# Patient Record
Sex: Male | Born: 1960 | Race: Black or African American | Hispanic: No | Smoking: Current every day smoker
Health system: Southern US, Community
[De-identification: ages and names within clinical notes are randomized; demographics above are authoritative.]

## PROBLEM LIST (undated history)

## (undated) DIAGNOSIS — K469 Unspecified abdominal hernia without obstruction or gangrene: Secondary | ICD-10-CM

## (undated) DIAGNOSIS — I10 Essential (primary) hypertension: Secondary | ICD-10-CM

## (undated) DIAGNOSIS — I2699 Other pulmonary embolism without acute cor pulmonale: Secondary | ICD-10-CM

## (undated) DIAGNOSIS — M1990 Unspecified osteoarthritis, unspecified site: Secondary | ICD-10-CM

## (undated) DIAGNOSIS — A159 Respiratory tuberculosis unspecified: Secondary | ICD-10-CM

## (undated) DIAGNOSIS — R569 Unspecified convulsions: Secondary | ICD-10-CM

## (undated) DIAGNOSIS — F1020 Alcohol dependence, uncomplicated: Secondary | ICD-10-CM

## (undated) DIAGNOSIS — S82409A Unspecified fracture of shaft of unspecified fibula, initial encounter for closed fracture: Secondary | ICD-10-CM

## (undated) HISTORY — PX: ANKLE SURGERY: SHX546

---

## 1998-04-24 ENCOUNTER — Emergency Department (HOSPITAL_COMMUNITY): Admission: EM | Admit: 1998-04-24 | Discharge: 1998-04-24 | Payer: Self-pay | Admitting: Emergency Medicine

## 1999-04-07 ENCOUNTER — Encounter: Admission: RE | Admit: 1999-04-07 | Discharge: 1999-04-07 | Payer: Self-pay | Admitting: Internal Medicine

## 1999-06-05 ENCOUNTER — Encounter: Payer: Self-pay | Admitting: Emergency Medicine

## 1999-06-05 ENCOUNTER — Emergency Department (HOSPITAL_COMMUNITY): Admission: EM | Admit: 1999-06-05 | Discharge: 1999-06-05 | Payer: Self-pay | Admitting: Emergency Medicine

## 1999-06-16 ENCOUNTER — Emergency Department (HOSPITAL_COMMUNITY): Admission: EM | Admit: 1999-06-16 | Discharge: 1999-06-16 | Payer: Self-pay | Admitting: *Deleted

## 1999-06-27 ENCOUNTER — Emergency Department (HOSPITAL_COMMUNITY): Admission: EM | Admit: 1999-06-27 | Discharge: 1999-06-27 | Payer: Self-pay | Admitting: Emergency Medicine

## 1999-06-27 ENCOUNTER — Encounter: Payer: Self-pay | Admitting: Emergency Medicine

## 1999-07-07 ENCOUNTER — Emergency Department (HOSPITAL_COMMUNITY): Admission: EM | Admit: 1999-07-07 | Discharge: 1999-07-07 | Payer: Self-pay | Admitting: Emergency Medicine

## 2001-05-14 ENCOUNTER — Emergency Department (HOSPITAL_COMMUNITY): Admission: EM | Admit: 2001-05-14 | Discharge: 2001-05-14 | Payer: Self-pay | Admitting: Emergency Medicine

## 2001-05-14 ENCOUNTER — Encounter: Payer: Self-pay | Admitting: Emergency Medicine

## 2001-07-03 ENCOUNTER — Encounter: Admission: RE | Admit: 2001-07-03 | Discharge: 2001-07-03 | Payer: Self-pay | Admitting: Internal Medicine

## 2002-09-21 ENCOUNTER — Encounter: Payer: Self-pay | Admitting: Emergency Medicine

## 2002-09-21 ENCOUNTER — Emergency Department (HOSPITAL_COMMUNITY): Admission: EM | Admit: 2002-09-21 | Discharge: 2002-09-21 | Payer: Self-pay | Admitting: Emergency Medicine

## 2003-11-05 ENCOUNTER — Emergency Department (HOSPITAL_COMMUNITY): Admission: AD | Admit: 2003-11-05 | Discharge: 2003-11-05 | Payer: Self-pay | Admitting: Family Medicine

## 2004-10-02 ENCOUNTER — Emergency Department (HOSPITAL_COMMUNITY): Admission: EM | Admit: 2004-10-02 | Discharge: 2004-10-02 | Payer: Self-pay | Admitting: Family Medicine

## 2005-02-06 ENCOUNTER — Emergency Department (HOSPITAL_COMMUNITY): Admission: EM | Admit: 2005-02-06 | Discharge: 2005-02-06 | Payer: Self-pay | Admitting: Emergency Medicine

## 2006-12-29 ENCOUNTER — Emergency Department (HOSPITAL_COMMUNITY): Admission: EM | Admit: 2006-12-29 | Discharge: 2006-12-29 | Payer: Self-pay | Admitting: Emergency Medicine

## 2008-01-21 ENCOUNTER — Emergency Department (HOSPITAL_COMMUNITY): Admission: EM | Admit: 2008-01-21 | Discharge: 2008-01-21 | Payer: Self-pay | Admitting: Emergency Medicine

## 2008-06-14 ENCOUNTER — Ambulatory Visit: Payer: Self-pay | Admitting: Internal Medicine

## 2008-06-14 ENCOUNTER — Encounter: Payer: Self-pay | Admitting: Family Medicine

## 2008-06-14 LAB — CONVERTED CEMR LAB
Albumin: 4.2 g/dL (ref 3.5–5.2)
BUN: 8 mg/dL (ref 6–23)
CO2: 25 meq/L (ref 19–32)
Calcium: 9.6 mg/dL (ref 8.4–10.5)
Chloride: 104 meq/L (ref 96–112)
Cholesterol: 177 mg/dL (ref 0–200)
Creatinine, Ser: 0.86 mg/dL (ref 0.40–1.50)
Eosinophils Absolute: 0.1 10*3/uL (ref 0.0–0.7)
Eosinophils Relative: 2 % (ref 0–5)
Glucose, Bld: 69 mg/dL — ABNORMAL LOW (ref 70–99)
HCT: 41.3 % (ref 39.0–52.0)
HDL: 82 mg/dL (ref >39)
Hemoglobin: 14.1 g/dL (ref 13.0–17.0)
Lymphocytes Relative: 19 % (ref 12–46)
Lymphs Abs: 1.4 10*3/uL (ref 0.7–4.0)
MCV: 92.6 fL (ref 78.0–100.0)
Monocytes Absolute: 0.7 10*3/uL (ref 0.1–1.0)
Potassium: 4.3 meq/L (ref 3.5–5.3)
TSH: 1.142 microintl units/mL (ref 0.350–4.50)
Triglycerides: 42 mg/dL (ref <150)
WBC: 7.6 10*3/uL (ref 4.0–10.5)

## 2009-06-17 ENCOUNTER — Emergency Department (HOSPITAL_COMMUNITY): Admission: EM | Admit: 2009-06-17 | Discharge: 2009-06-17 | Payer: Self-pay | Admitting: Emergency Medicine

## 2009-06-18 ENCOUNTER — Emergency Department (HOSPITAL_COMMUNITY): Admission: EM | Admit: 2009-06-18 | Discharge: 2009-06-18 | Payer: Self-pay | Admitting: Emergency Medicine

## 2009-06-25 ENCOUNTER — Emergency Department (HOSPITAL_COMMUNITY): Admission: EM | Admit: 2009-06-25 | Discharge: 2009-06-25 | Payer: Self-pay | Admitting: Emergency Medicine

## 2009-07-04 ENCOUNTER — Emergency Department (HOSPITAL_COMMUNITY): Admission: EM | Admit: 2009-07-04 | Discharge: 2009-07-04 | Payer: Self-pay | Admitting: Emergency Medicine

## 2009-08-07 ENCOUNTER — Emergency Department (HOSPITAL_COMMUNITY): Admission: EM | Admit: 2009-08-07 | Discharge: 2009-08-07 | Payer: Self-pay | Admitting: Emergency Medicine

## 2011-02-16 ENCOUNTER — Emergency Department (HOSPITAL_COMMUNITY)
Admission: EM | Admit: 2011-02-16 | Discharge: 2011-02-16 | Disposition: A | Discharge status: Home / Self Care | Payer: Self-pay | Attending: Emergency Medicine | Admitting: Emergency Medicine

## 2011-02-16 ENCOUNTER — Emergency Department (HOSPITAL_COMMUNITY): Payer: Self-pay

## 2011-02-16 DIAGNOSIS — M25579 Pain in unspecified ankle and joints of unspecified foot: Secondary | ICD-10-CM | POA: Insufficient documentation

## 2011-02-16 DIAGNOSIS — Y9302 Activity, running: Secondary | ICD-10-CM | POA: Insufficient documentation

## 2011-02-16 DIAGNOSIS — X500XXA Overexertion from strenuous movement or load, initial encounter: Secondary | ICD-10-CM | POA: Insufficient documentation

## 2011-02-16 DIAGNOSIS — S82409A Unspecified fracture of shaft of unspecified fibula, initial encounter for closed fracture: Secondary | ICD-10-CM | POA: Insufficient documentation

## 2011-02-26 ENCOUNTER — Ambulatory Visit (HOSPITAL_BASED_OUTPATIENT_CLINIC_OR_DEPARTMENT_OTHER)
Admission: RE | Admit: 2011-02-26 | Discharge: 2011-02-26 | Disposition: A | Discharge status: Home / Self Care | Payer: Self-pay | Source: Ambulatory Visit | Attending: Orthopedic Surgery | Admitting: Orthopedic Surgery

## 2011-02-26 DIAGNOSIS — F17200 Nicotine dependence, unspecified, uncomplicated: Secondary | ICD-10-CM | POA: Insufficient documentation

## 2011-02-26 DIAGNOSIS — S82899A Other fracture of unspecified lower leg, initial encounter for closed fracture: Secondary | ICD-10-CM | POA: Insufficient documentation

## 2011-02-26 DIAGNOSIS — Y929 Unspecified place or not applicable: Secondary | ICD-10-CM | POA: Insufficient documentation

## 2011-02-26 DIAGNOSIS — X58XXXA Exposure to other specified factors, initial encounter: Secondary | ICD-10-CM | POA: Insufficient documentation

## 2011-02-26 LAB — POCT HEMOGLOBIN-HEMACUE: Hemoglobin: 15.2 g/dL (ref 13.0–17.0)

## 2011-03-01 NOTE — Op Note (Signed)
[name], [name] NO.:  [id number]  MEDICAL RECORD NO.:  [id number]           PATIENT TYPE:  LOCATION:                                 FACILITY:  PHYSICIAN:  [name], MD    DATE OF BIRTH:  1960/12/25  DATE OF PROCEDURE:  02/26/2011 DATE OF DISCHARGE:                              OPERATIVE REPORT   PREOPERATIVE DIAGNOSIS:  Left distal fibula fracture.  POSTOPERATIVE DIAGNOSIS:  Left distal fibula fracture.  OPERATIVE PROCEDURE:  Open reduction and internal fixation, left distal fibula fracture.  ANESTHESIA:  General.  ESTIMATED BLOOD LOSS:  Minimal.  TOURNIQUET TIME:  Approximately 40 minutes.  ATTENDING SURGEON:  [name], MD  ASSISTANT:  Janace Litten, orthopedic PA-C  OPERATIVE IMPLANT:  Synthes one-third tubular plate 5-hole with an interfragmentary lag screw.  PREOPERATIVE INDICATIONS:  [name] is a 50 year old gentleman who broke his left ankle.  He presented to the office and had primarily lateral ankle pain.  He has a history of noncompliance, and had a similar type injury on his right side done in the past, and was placed in a long-leg cast, and he adamantly refused to be placed in another of the long-leg cast on his left lower extremity.  Nonetheless, it was apparent that he was going to be weightbearing on it, given his noncompliance.  The risks, benefits, and alternatives were discussed with him before the procedure including but not limited to the risks of infection, bleeding, nerve injury, malunion, nonunion, hardware prominence, hardware failure, loss of reduction with weightbearing, cardiopulmonary complications, among others and he is willing to proceed.  OPERATIVE PROCEDURE:  The patient was brought to the operating room and placed in supine position.  IV antibiotics were given.  General anesthesia was administered.  The left leg was stressed and the medial clear space unstable.  The left lower  extremity was then prepped and draped in the usual sterile fashion.  Lateral incision of the fibula was performed.  Dissection was carried down.  The fracture was identified and cleaned and reduced anatomically and held with a clamp.  I placed a front to back lag screw and this secured anatomic fixation.  I then applied a 5-hole plate.  I used 2 screws distally and 2 screws proximally.  One of the screws distally had mediocre bite, and the most distal screw had excellent bite.  Both proximal screws had excellent bite.  I then irrigated the wounds copiously and then repaired the tissue with Vicryl followed by Steri-Strips and sterile gauze.  The patient was awakened and extubated, returned to PACU in stable and satisfactory condition.  There were no complications and he tolerated the procedure well.  I did stress the syndesmosis after fixation and this was stable.  Janace Litten, orthopedic PA-C was present and scrubbed and critical for exposure, reduction, as well as closure.  He was placed in a short-leg fiberglass cast and bivalved, inorder to provide some added protection given his risk for noncompliance.     [name], MD     JPL/MEDQ  D:  02/26/2011  T:  02/27/2011  Job:  [id number]  Electronically Signed by Teryl Lucy MD on 03/01/2011 04:54:15 PM

## 2011-03-12 LAB — DIFFERENTIAL
Basophils Absolute: 0 10*3/uL (ref 0.0–0.1)
Lymphocytes Relative: 19 % (ref 12–46)
Lymphs Abs: 0.9 10*3/uL (ref 0.7–4.0)
Monocytes Absolute: 0.6 10*3/uL (ref 0.1–1.0)
Neutro Abs: 3 10*3/uL (ref 1.7–7.7)

## 2011-03-12 LAB — POCT I-STAT, CHEM 8
Creatinine, Ser: 0.9 mg/dL (ref 0.4–1.5)
Glucose, Bld: 95 mg/dL (ref 70–99)
Hemoglobin: 13.9 g/dL (ref 13.0–17.0)
Sodium: 140 mEq/L (ref 135–145)
TCO2: 28 mmol/L (ref 0–100)

## 2011-03-12 LAB — RAPID URINE DRUG SCREEN, HOSP PERFORMED
Barbiturates: NOT DETECTED
Benzodiazepines: NOT DETECTED
Cocaine: NOT DETECTED

## 2011-03-12 LAB — CBC
Hemoglobin: 12.9 g/dL — ABNORMAL LOW (ref 13.0–17.0)
RDW: 14.5 % (ref 11.5–15.5)
WBC: 4.5 10*3/uL (ref 4.0–10.5)

## 2011-03-12 LAB — ETHANOL: Alcohol, Ethyl (B): 176 mg/dL — ABNORMAL HIGH (ref 0–10)

## 2011-08-21 ENCOUNTER — Emergency Department (HOSPITAL_COMMUNITY): Payer: Self-pay

## 2011-08-21 ENCOUNTER — Emergency Department (HOSPITAL_COMMUNITY)
Admission: EM | Admit: 2011-08-21 | Discharge: 2011-08-21 | Disposition: A | Discharge status: Home / Self Care | Payer: Self-pay | Attending: Emergency Medicine | Admitting: Emergency Medicine

## 2011-08-21 DIAGNOSIS — R040 Epistaxis: Secondary | ICD-10-CM | POA: Insufficient documentation

## 2011-08-21 DIAGNOSIS — R221 Localized swelling, mass and lump, neck: Secondary | ICD-10-CM | POA: Insufficient documentation

## 2011-08-21 DIAGNOSIS — J322 Chronic ethmoidal sinusitis: Secondary | ICD-10-CM | POA: Insufficient documentation

## 2011-08-21 DIAGNOSIS — S022XXA Fracture of nasal bones, initial encounter for closed fracture: Secondary | ICD-10-CM | POA: Insufficient documentation

## 2011-08-21 DIAGNOSIS — R220 Localized swelling, mass and lump, head: Secondary | ICD-10-CM | POA: Insufficient documentation

## 2012-02-09 ENCOUNTER — Encounter (HOSPITAL_COMMUNITY): Payer: Self-pay | Admitting: *Deleted

## 2012-02-09 ENCOUNTER — Emergency Department (HOSPITAL_COMMUNITY)
Admission: EM | Admit: 2012-02-09 | Discharge: 2012-02-09 | Disposition: A | Discharge status: Home / Self Care | Payer: Self-pay | Attending: Emergency Medicine | Admitting: Emergency Medicine

## 2012-02-09 DIAGNOSIS — M1710 Unilateral primary osteoarthritis, unspecified knee: Secondary | ICD-10-CM

## 2012-02-09 DIAGNOSIS — M25569 Pain in unspecified knee: Secondary | ICD-10-CM | POA: Insufficient documentation

## 2012-02-09 DIAGNOSIS — F17200 Nicotine dependence, unspecified, uncomplicated: Secondary | ICD-10-CM | POA: Insufficient documentation

## 2012-02-09 MED ORDER — HYDROCODONE-ACETAMINOPHEN 5-325 MG PO TABS: 1.0000 | ORAL_TABLET | ORAL | Status: AC | PRN

## 2012-02-09 MED ORDER — NAPROXEN 500 MG PO TABS
500.0000 mg | ORAL_TABLET | Freq: Once | ORAL | Status: AC
  Administered 2012-02-09: 500 mg via ORAL
  Filled 2012-02-09: qty 1

## 2012-02-09 MED ORDER — NAPROXEN 500 MG PO TABS: 500.0000 mg | ORAL_TABLET | Freq: Two times a day (BID) | ORAL | Status: DC

## 2012-02-09 NOTE — ED Notes (Signed)
Pt states he is riding the bus home.  

## 2012-02-09 NOTE — Discharge Instructions (Signed)
FOLLOW UP WITH YOUR DOCTOR FOR FURTHER MANAGEMENT OF CHRONIC KNEE PAIN. TAKE NAPROSYN AS DIRECTED, AND NORCO AT NIGHT IF NEEDED FOR ADDITIONAL PAIN.  RETURN HERE AS NEEDED.  Arthritis, Nonspecific Arthritis is pain, redness, warmth, or puffiness (swelling) of a joint. The joint may be stiff or hurt when you move it. One or more joints may be affected. There are many types of arthritis. Your doctor may not know what type you have right away. The most common cause of arthritis is wear and tear on the joint (osteoarthritis). HOME CARE   Only take medicine as told by your doctor.   Rest the joint as much as possible.   Raise (elevate) your joint if it is puffy.   Use crutches if the painful joint is in your leg.   Drink enough water and fluids to keep your pee (urine) clear or pale yellow.   Follow your doctor's instructions for diet.   Use cold packs for very bad joint pain for 10 to 15 minutes every hour. Ask your doctor if it is okay for you to use hot packs.   Exercise as told by your doctor.   Take a warm shower if you have stiffness in the morning.   Move your sore joints throughout the day.  GET HELP RIGHT AWAY IF:   You do not feel better in 24 hours or are getting worse.   You are having side effects from your medicine.   You are not getting better with treatment.   You have a fever.   You have very bad joint pain, puffiness, or redness.   Many joints become painful and puffy.   You have very bad back pain or leg weakness.   You cannot control when you poop (bowel movement) or pee (urinate).  MAKE SURE YOU:   Understand these instructions.   Will watch your condition.   Will get help right away if you are not doing well or get worse.  Document Released: 02/16/2010 Document Revised: 11/11/2011 Document Reviewed: 02/16/2010 [facility] Patient Information 2012 [place], [place].

## 2012-02-09 NOTE — ED Provider Notes (Signed)
History     CSN: [id number]  Arrival date & time 02/09/12  1043   First MD Initiated Contact with Patient 02/09/12 1047      Chief Complaint  Patient presents with  . Knee Pain    (Consider location/radiation/quality/duration/timing/severity/associated sxs/prior treatment) Patient is a 51 y.o. male presenting with knee pain. The history is provided by the patient.  Knee Pain This is a chronic problem. The current episode started more than 1 month ago. The problem occurs daily. The problem has been unchanged. Pertinent negatives include no chills or fever. Associated symptoms comments: Bilateral knee pain for "months". He reports painful knots on medial knees chronically. He denies swelling, redness, calf pain. Worse with movement, initial ambulation that is better over time. 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 . The symptoms are aggravated by bending, standing and walking. He has tried acetaminophen for the symptoms. The treatment provided no relief.    History reviewed. No pertinent past medical history.  Past Surgical History  Procedure Date  . Ankle surgery     No family history on file.  History  Substance Use Topics  . Smoking status: Current Everyday Smoker  . Smokeless tobacco: Not on file  . Alcohol Use: 1.2 oz/week    2 Cans of beer per week      Review of Systems  Constitutional: Negative for fever and chills.  HENT: Negative.   Respiratory: Negative.   Cardiovascular: Negative.   Gastrointestinal: Negative.   Musculoskeletal: Negative.        See HPI  Skin: Negative.   Neurological: Negative.     Allergies  Review of patient's allergies indicates no known allergies.  Home Medications   Current Outpatient Rx  Name Route Sig Dispense Refill  . IBUPROFEN 200 MG PO TABS Oral Take 400 mg by mouth every 8 (eight) hours as needed. For pain.      BP 127/82  Pulse 94  Temp(Src) 98.7 F (37.1 C) (Oral)  Resp 22  Ht 6\' 1"   (1.854 m)  Wt 185 lb (83.915 kg)  BMI 24.41 kg/m2  SpO2 98%  Physical Exam  Constitutional: He is oriented to person, place, and time. He appears well-developed and well-nourished.  Neck: Normal range of motion.  Pulmonary/Chest: Effort normal.  Musculoskeletal: Normal range of motion.       Knees bilaterally unremarkable in appearance without redness, swelling. Joints stable without laxity. Nontender joints and nontender calves. Distal pulses 2+.  Neurological: He is alert and oriented to person, place, and time.  Skin: Skin is warm and dry.  Psychiatric: He has a normal mood and affect.    ED Course  Procedures (including critical care time)  Labs Reviewed - No data to display No results found.   No diagnosis found.    MDM          Rodena Medin, PA-C 02/09/12 1141

## 2012-02-09 NOTE — Progress Notes (Signed)
Pt listed as self pay with no insurance coverage Pt confirms he is self pay [place] resident.  CM and [facility] coordinator spoke with him Pt offered [facility] services to assist with finding a [place] self pay provider  

## 2012-02-09 NOTE — ED Notes (Signed)
Pt states he has been having bilateral knee pain since he was younger. Pt states pt would come and go. Pt states as he has gotten older pain has became constant. Pt states he has applied for disability. Pt states he wants information on his knee so disability can see that he is getting check out

## 2012-02-10 NOTE — ED Provider Notes (Signed)
Medical screening examination/treatment/procedure(s) were performed by non-physician practitioner and as supervising physician I was immediately available for consultation/collaboration.  Ivonna Kinnick, MD 02/10/12 0726 

## 2012-03-22 ENCOUNTER — Emergency Department (HOSPITAL_COMMUNITY)
Admission: EM | Admit: 2012-03-22 | Discharge: 2012-03-23 | Disposition: A | Discharge status: Home / Self Care | Payer: Self-pay | Attending: Emergency Medicine | Admitting: Emergency Medicine

## 2012-03-22 ENCOUNTER — Emergency Department (HOSPITAL_COMMUNITY): Payer: Self-pay

## 2012-03-22 ENCOUNTER — Encounter (HOSPITAL_COMMUNITY): Payer: Self-pay | Admitting: Emergency Medicine

## 2012-03-22 DIAGNOSIS — R109 Unspecified abdominal pain: Secondary | ICD-10-CM | POA: Insufficient documentation

## 2012-03-22 DIAGNOSIS — M25519 Pain in unspecified shoulder: Secondary | ICD-10-CM | POA: Insufficient documentation

## 2012-03-22 DIAGNOSIS — R079 Chest pain, unspecified: Secondary | ICD-10-CM | POA: Insufficient documentation

## 2012-03-22 DIAGNOSIS — S0180XA Unspecified open wound of other part of head, initial encounter: Secondary | ICD-10-CM | POA: Insufficient documentation

## 2012-03-22 DIAGNOSIS — S2231XA Fracture of one rib, right side, initial encounter for closed fracture: Secondary | ICD-10-CM

## 2012-03-22 DIAGNOSIS — R221 Localized swelling, mass and lump, neck: Secondary | ICD-10-CM | POA: Insufficient documentation

## 2012-03-22 DIAGNOSIS — S0990XA Unspecified injury of head, initial encounter: Secondary | ICD-10-CM | POA: Insufficient documentation

## 2012-03-22 DIAGNOSIS — S2239XA Fracture of one rib, unspecified side, initial encounter for closed fracture: Secondary | ICD-10-CM | POA: Insufficient documentation

## 2012-03-22 DIAGNOSIS — S0181XA Laceration without foreign body of other part of head, initial encounter: Secondary | ICD-10-CM

## 2012-03-22 DIAGNOSIS — R220 Localized swelling, mass and lump, head: Secondary | ICD-10-CM | POA: Insufficient documentation

## 2012-03-22 DIAGNOSIS — S42009A Fracture of unspecified part of unspecified clavicle, initial encounter for closed fracture: Secondary | ICD-10-CM | POA: Insufficient documentation

## 2012-03-22 DIAGNOSIS — S42001A Fracture of unspecified part of right clavicle, initial encounter for closed fracture: Secondary | ICD-10-CM

## 2012-03-22 DIAGNOSIS — R51 Headache: Secondary | ICD-10-CM | POA: Insufficient documentation

## 2012-03-22 LAB — URINALYSIS, MICROSCOPIC ONLY
Bilirubin Urine: NEGATIVE
Hgb urine dipstick: NEGATIVE
Nitrite: NEGATIVE
Specific Gravity, Urine: 1.011 (ref 1.005–1.030)
pH: 5 (ref 5.0–8.0)

## 2012-03-22 LAB — CBC
HCT: 43.6 % (ref 39.0–52.0)
MCV: 93.8 fL (ref 78.0–100.0)
RBC: 4.65 MIL/uL (ref 4.22–5.81)
WBC: 9.6 10*3/uL (ref 4.0–10.5)

## 2012-03-22 LAB — COMPREHENSIVE METABOLIC PANEL
Albumin: 4.1 g/dL (ref 3.5–5.2)
BUN: 10 mg/dL (ref 6–23)
Calcium: 9.1 mg/dL (ref 8.4–10.5)
Creatinine, Ser: 0.96 mg/dL (ref 0.50–1.35)
Total Protein: 7.2 g/dL (ref 6.0–8.3)

## 2012-03-22 LAB — LACTIC ACID, PLASMA: Lactic Acid, Venous: 2.1 mmol/L (ref 0.5–2.2)

## 2012-03-22 LAB — SAMPLE TO BLOOD BANK

## 2012-03-22 MED ORDER — MORPHINE SULFATE 4 MG/ML IJ SOLN
4.0000 mg | Freq: Once | INTRAMUSCULAR | Status: AC
  Administered 2012-03-22: 4 mg via INTRAVENOUS
  Filled 2012-03-22: qty 1

## 2012-03-22 MED ORDER — SODIUM CHLORIDE 0.9 % IV BOLUS (SEPSIS)
1000.0000 mL | Freq: Once | INTRAVENOUS | Status: AC
  Administered 2012-03-22: 1000 mL via INTRAVENOUS

## 2012-03-22 MED ORDER — TETANUS-DIPHTH-ACELL PERTUSSIS 5-2.5-18.5 LF-MCG/0.5 IM SUSP
0.5000 mL | Freq: Once | INTRAMUSCULAR | Status: AC
  Administered 2012-03-22: 0.5 mL via INTRAMUSCULAR

## 2012-03-22 MED ORDER — IOHEXOL 300 MG/ML  SOLN
80.0000 mL | Freq: Once | INTRAMUSCULAR | Status: AC | PRN
  Administered 2012-03-22: 80 mL via INTRAVENOUS

## 2012-03-22 MED ORDER — TETANUS-DIPHTHERIA TOXOIDS TD 5-2 LFU IM INJ
0.5000 mL | INJECTION | Freq: Once | INTRAMUSCULAR | Status: DC
  Filled 2012-03-22: qty 0.5

## 2012-03-22 MED ORDER — SODIUM CHLORIDE 0.9 % IV SOLN
Freq: Once | INTRAVENOUS | Status: AC
  Administered 2012-03-22: 19:00:00 via INTRAVENOUS

## 2012-03-22 MED ORDER — IOHEXOL 300 MG/ML  SOLN
100.0000 mL | Freq: Once | INTRAMUSCULAR | Status: AC | PRN
  Administered 2012-03-22: 100 mL via INTRAVENOUS

## 2012-03-22 NOTE — ED Notes (Signed)
Patient transported to CT 
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
## 2012-03-22 NOTE — ED Notes (Signed)
Pt insisted sling be removed, states it is causing him more pain in the right shoulder
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
## 2012-03-22 NOTE — ED Notes (Signed)
Ortho tech called for immobilizer 

## 2012-03-22 NOTE — Progress Notes (Signed)
Orthopedic Tech Progress Note Patient Details:  Frederick [name] 09-19-1961 [id number]  Other Ortho Devices Type of Ortho Device: Other (comment) (shoulder immobilizer) Ortho Device Location: (R) UE Ortho Device Interventions: Application   [name] 03/22/2012, 8:50 PM

## 2012-03-23 MED ORDER — OXYCODONE-ACETAMINOPHEN 5-325 MG PO TABS: 2.0000 | ORAL_TABLET | Freq: Four times a day (QID) | ORAL | Status: AC | PRN

## 2012-03-23 MED ORDER — IBUPROFEN 800 MG PO TABS: 800.0000 mg | ORAL_TABLET | Freq: Three times a day (TID) | ORAL | Status: AC | PRN

## 2012-03-23 MED ORDER — DOCUSATE SODIUM 100 MG PO CAPS: 100.0000 mg | ORAL_CAPSULE | Freq: Two times a day (BID) | ORAL | Status: AC

## 2012-03-23 MED ORDER — MORPHINE SULFATE 4 MG/ML IJ SOLN
4.0000 mg | Freq: Once | INTRAMUSCULAR | Status: AC
  Administered 2012-03-23: 4 mg via INTRAVENOUS
  Filled 2012-03-23: qty 1

## 2012-03-23 NOTE — ED Notes (Signed)
Pt given IS, demonstrated and patient able to return demonstration

## 2012-03-23 NOTE — Discharge Instructions (Signed)
Clavicle Fracture A clavicle fracture is a break in the collarbone. This is a common injury, especially in children. Collarbones do not harden until around the age of 11. Most collarbone fractures are treated with a simple arm sling. In some cases a figure-of-eight splint is used to help hold the broken bones in position. Although not often needed, surgery may be required if the bone fragments are not in the correct position (displaced).  HOME CARE INSTRUCTIONS   Apply ice to the injury for 15 to 20 minutes each hour while awake for 2 days. Put the ice in a plastic bag and place a towel between the bag of ice and your skin.   Wear the sling or splint constantly for as long as directed by your caregiver. You may remove the sling or splint for bathing or showering. Be sure to keep your shoulder in the same place as when the sling or splint is on. Do not lift your arm.   If a figure-of-eight splint is applied, it must be tightened by another person every day. Tighten it enough to keep the shoulders held back. Allow enough room to place the index finger between the body and strap. Loosen the splint immediately if you feel numbness or tingling in your hands.   Only take over-the-counter or prescription medicines for pain, discomfort, or fever as directed by your caregiver.   Avoid activities that irritate or increase the pain for 4 to 6 weeks after surgery.   Follow all instructions for follow-up with your caregiver. This includes any referrals, physical therapy, and rehabilitation. Any delay in obtaining necessary care could result in a delay or failure of the injury to heal properly.  SEEK MEDICAL CARE IF:  You have pain and swelling that are not relieved with medications. SEEK IMMEDIATE MEDICAL CARE IF:  Your arm is numb, cold, or pale, even when the splint is loose. MAKE SURE YOU:   Understand these instructions.   Will watch your condition.   Will get help right away if you are not doing  well or get worse.  Document Released: 09/01/2005 Document Revised: 11/11/2011 Document Reviewed: 06/27/2008 [facility] Patient Information 2012 [place], [place].Blunt Trauma You have been evaluated for injuries. You have been examined and your caregiver has not found injuries serious enough to require hospitalization. It is common to have multiple bruises and sore muscles following an accident. These tend to feel worse for the first 24 hours. You will feel more stiffness and soreness over the next several hours and worse when you wake up the first morning after your accident. After this point, you should begin to improve with each passing day. The amount of improvement depends on the amount of damage done in the accident. Following your accident, if some part of your body does not work as it should, or if the pain in any area continues to increase, you should return to the Emergency Department for re-evaluation.  HOME CARE INSTRUCTIONS  Routine care for sore areas should include:  Ice to sore areas every 2 hours for 20 minutes while awake for the next 2 days.   Drink extra fluids (not alcohol).   Take a hot or warm shower or bath once or twice a day to increase blood flow to sore muscles. This will help you "limber up".   Activity as tolerated. Lifting may aggravate neck or back pain.   Only take over-the-counter or prescription medicines for pain, discomfort, or fever as directed by your caregiver. Do not  use aspirin. This may increase bruising or increase bleeding if there are small areas where this is happening.  SEEK IMMEDIATE MEDICAL CARE IF:  Numbness, tingling, weakness, or problem with the use of your arms or legs.   A severe headache is not relieved with medications.   There is a change in bowel or bladder control.   Increasing pain in any areas of the body.   Short of breath or dizzy.   Nauseated, vomiting, or sweating.   Increasing belly (abdominal) discomfort.   Blood in  urine, stool, or vomiting blood.   Pain in either shoulder in an area where a shoulder strap would be.   Feelings of lightheadedness or if you have a fainting episode.  Sometimes it is not possible to identify all injuries immediately after the trauma. It is important that you continue to monitor your condition after the emergency department visit. If you feel you are not improving, or improving more slowly than should be expected, call your physician. If you feel your symptoms (problems) are worsening, return to the Emergency Department immediately. Document Released: 08/18/2001 Document Revised: 11/11/2011 Document Reviewed: 07/10/2008 [facility] Patient Information 2012 [place], [place].Laceration Care, Adult A laceration is a cut or lesion that goes through all layers of the skin and into the tissue just beneath the skin. TREATMENT  Some lacerations may not require closure. Some lacerations may not be able to be closed due to an increased risk of infection. It is important to see your caregiver as soon as possible after an injury to minimize the risk of infection and maximize the opportunity for successful closure. If closure is appropriate, pain medicines may be given, if needed. The wound will be cleaned to help prevent infection. Your caregiver will use stitches (sutures), staples, wound glue (adhesive), or skin adhesive strips to repair the laceration. These tools bring the skin edges together to allow for faster healing and a better cosmetic outcome. However, all wounds will heal with a scar. Once the wound has healed, scarring can be minimized by covering the wound with sunscreen during the day for 1 full year. HOME CARE INSTRUCTIONS  For sutures or staples:  Keep the wound clean and dry.   If you were given a bandage (dressing), you should change it at least once a day. Also, change the dressing if it becomes wet or dirty, or as directed by your caregiver.   Wash the wound with soap and  water 2 times a day. Rinse the wound off with water to remove all soap. Pat the wound dry with a clean towel.   After cleaning, apply a thin layer of the antibiotic ointment as recommended by your caregiver. This will help prevent infection and keep the dressing from sticking.   You may shower as usual after the first 24 hours. Do not soak the wound in water until the sutures are removed.   Only take over-the-counter or prescription medicines for pain, discomfort, or fever as directed by your caregiver.   Get your sutures or staples removed as directed by your caregiver.  For skin adhesive strips:  Keep the wound clean and dry.   Do not get the skin adhesive strips wet. You may bathe carefully, using caution to keep the wound dry.   If the wound gets wet, pat it dry with a clean towel.   Skin adhesive strips will fall off on their own. You may trim the strips as the wound heals. Do not remove skin adhesive strips that  are still stuck to the wound. They will fall off in time.  For wound adhesive:  You may briefly wet your wound in the shower or bath. Do not soak or scrub the wound. Do not swim. Avoid periods of heavy perspiration until the skin adhesive has fallen off on its own. After showering or bathing, gently pat the wound dry with a clean towel.   Do not apply liquid medicine, cream medicine, or ointment medicine to your wound while the skin adhesive is in place. This may loosen the film before your wound is healed.   If a dressing is placed over the wound, be careful not to apply tape directly over the skin adhesive. This may cause the adhesive to be pulled off before the wound is healed.   Avoid prolonged exposure to sunlight or tanning lamps while the skin adhesive is in place. Exposure to ultraviolet light in the first year will darken the scar.   The skin adhesive will usually remain in place for 5 to 10 days, then naturally fall off the skin. Do not pick at the adhesive film.    You may need a tetanus shot if:  You cannot remember when you had your last tetanus shot.   You have never had a tetanus shot.  If you get a tetanus shot, your arm may swell, get red, and feel warm to the touch. This is common and not a problem. If you need a tetanus shot and you choose not to have one, there is a rare chance of getting tetanus. Sickness from tetanus can be serious. SEEK MEDICAL CARE IF:   You have redness, swelling, or increasing pain in the wound.   You see a red line that goes away from the wound.   You have yellowish-white fluid (pus) coming from the wound.   You have a fever.   You notice a bad smell coming from the wound or dressing.   Your wound breaks open before or after sutures have been removed.   You notice something coming out of the wound such as wood or glass.   Your wound is on your hand or foot and you cannot move a finger or toe.  SEEK IMMEDIATE MEDICAL CARE IF:   Your pain is not controlled with prescribed medicine.   You have severe swelling around the wound causing pain and numbness or a change in color in your arm, hand, leg, or foot.   Your wound splits open and starts bleeding.   You have worsening numbness, weakness, or loss of function of any joint around or beyond the wound.   You develop painful lumps near the wound or on the skin anywhere on your body.  MAKE SURE YOU:   Understand these instructions.   Will watch your condition.   Will get help right away if you are not doing well or get worse.  Document Released: 11/22/2005 Document Revised: 11/11/2011 Document Reviewed: 05/18/2011 [facility] Patient Information 2012 [place], [place].Head Injury, Adult You have had a head injury that does not appear serious at this time. A concussion is a state of changed mental ability, usually from a blow to the head. You should take clear liquids for the rest of the day and then resume your regular diet. You should not take sedatives or  alcoholic beverages for as long as directed by your caregiver after discharge. After injuries such as yours, most problems occur within the first 24 hours. SYMPTOMS These minor symptoms may be experienced after  discharge:  Memory difficulties.   Dizziness.   Headaches.   Double vision.   Hearing difficulties.   Depression.   Tiredness.   Weakness.   Difficulty with concentration.  If you experience any of these problems, you should not be alarmed. A concussion requires a few days for recovery. Many patients with head injuries frequently experience such symptoms. Usually, these problems disappear without medical care. If symptoms last for more than one day, notify your caregiver. See your caregiver sooner if symptoms are becoming worse rather than better. HOME CARE INSTRUCTIONS   During the next 24 hours you must stay with someone who can watch you for the warning signs listed below.  Although it is unlikely that serious side effects will occur, you should be aware of signs and symptoms which may necessitate your return to this location. Side effects may occur up to 7 - 10 days following the injury. It is important for you to carefully monitor your condition and contact your caregiver or seek immediate medical attention if there is a change in your condition. SEEK IMMEDIATE MEDICAL CARE IF:   There is confusion or drowsiness.   You can not awaken the injured person.   There is nausea (feeling sick to your stomach) or continued, forceful vomiting.   You notice dizziness or unsteadiness which is getting worse, or inability to walk.   You have convulsions or unconsciousness.   You experience severe, persistent headaches not relieved by over-the-counter or prescription medicines for pain. (Do not take aspirin as this impairs clotting abilities). Take other pain medications only as directed.   You can not use arms or legs normally.   There is clear or bloody discharge from the  nose or ears.  MAKE SURE YOU:   Understand these instructions.   Will watch your condition.   Will get help right away if you are not doing well or get worse.  Document Released: 11/22/2005 Document Revised: 11/11/2011 Document Reviewed: 10/10/2009 [employer] Patient Information 2012 [employer], [employer] Spirometer An incentive spirometer is a tool that can help keep your lungs clear and active. This tool measures how well you are filling your lungs with each breath. Taking long deep breaths may help reverse or decrease the chance of developing breathing (pulmonary) problems (especially infection) following:  Surgery of the chest or abdomen.   Surgery if you have a history of smoking or a lung problem.   A long period of time when you are unable to move or be active.  BEFORE THE PROCEDURE   If the spirometer includes an indictor to show your best effort, your nurse or respiratory therapist will set it to a desired goal.   If possible, sit up straight or lean slightly forward. Try not to slouch.   Hold the incentive spirometer in an upright position.  INSTRUCTIONS FOR USE  1. Sit on the edge of your bed if possible, or sit up as far as you can in bed or on a chair.  2. Hold the incentive spirometer in an upright position.  3. Breathe out normally.  4. Place the mouthpiece in your mouth and seal your lips tightly around it.  5. Breathe in slowly and as deeply as possible, raising the piston or the ball toward the top of the column.  6. Hold your breath for 3-5 seconds or for as long as possible. Allow the piston or ball to fall to the bottom of the column.  7. Remove the mouthpiece from your mouth and  breathe out normally.  8. Rest for a few seconds and repeat Steps 1 through 7 at least 10 times every 1-2 hours when you are awake. Take your time and take a few normal breaths between deep breaths.  9. The spirometer may include an indicator to show your best effort. Use the indicator as  a goal to work toward during each repetition.  10. After each set of 10 deep breaths, practice coughing to be sure your lungs are clear. If you have an incision (the cut made at the time of surgery), support your incision when coughing by placing a pillow or rolled up towels firmly against it.  Once you are able to get out of bed, walk around indoors and cough well. You may stop using the incentive spirometer when instructed by your caregiver.  RISKS AND COMPLICATIONS  Breathing too quickly may cause dizziness. At an extreme, this could cause you to pass out. Take your time so you do not get dizzy or light-headed.   If you are in pain, you may need to take or ask for pain medication before doing incentive spirometry. It is harder to take a deep breath if you are having pain.  AFTER USE  Rest and breathe slowly and easily.   It can be helpful to keep track of a log of your progress. Your caregiver can provide you with a simple table to help with this.  If you are using the spirometer at home, follow these instructions: SEEK MEDICAL CARE IF:   You are having difficultly using the spirometer.   You have trouble using the spirometer as often as instructed.   Your pain medication is not giving enough relief while using the spirometer.   You develop fever of 100.5 F (38.1 C) or higher.  SEEK IMMEDIATE MEDICAL CARE IF:   You cough up bloody sputum that had not been present before.   You develop fever of 102 F (38.9 C) or greater.   You develop worsening pain at or near the incision site.  MAKE SURE YOU:   Understand these instructions.   Will watch your condition.   Will get help right away if you are not doing well or get worse.  Document Released: 04/04/2007 Document Revised: 11/11/2011 Document Reviewed: 06/05/2007 [facility] Patient Information 2012 [place], [place].Rib Fracture Your caregiver has diagnosed you as having a rib fracture (a break). This can occur by a blow to the  chest, by a fall against a hard object, or by violent coughing or sneezing. There may be one or many breaks. Rib fractures may heal on their own within 3 to 8 weeks. The longer healing period is usually associated with a continued cough or other aggravating activities. HOME CARE INSTRUCTIONS   Avoid strenuous activity. Be careful during activities and avoid bumping the injured rib. Activities that cause pain pull on the fracture site(s) and are best avoided if possible.   Eat a normal, well-balanced diet. Drink plenty of fluids to avoid constipation.   Take deep breaths several times a day to keep lungs free of infection. Try to cough several times a day, splinting the injured area with a pillow. This will help prevent pneumonia.   Do not wear a rib belt or binder. These restrict breathing which can lead to pneumonia.   Only take over-the-counter or prescription medicines for pain, discomfort, or fever as directed by your caregiver.  SEEK MEDICAL CARE IF:  You develop a continual cough, associated with thick or bloody  sputum. SEEK IMMEDIATE MEDICAL CARE IF:   You have a fever.   You have difficulty breathing.   You have nausea (feeling sick to your stomach), vomiting, or abdominal (belly) pain.   You have worsening pain, not controlled with medications.  Document Released: 11/22/2005 Document Revised: 11/11/2011 Document Reviewed: 04/26/2007 [facility] Patient Information 2012 [employer].

## 2012-03-24 NOTE — ED Provider Notes (Signed)
I saw and evaluated the patient, reviewed the resident's note and I agree with the findings and plan.   Ryen Rhames, MD 03/24/12 0735 

## 2012-04-27 ENCOUNTER — Encounter (HOSPITAL_COMMUNITY): Payer: Self-pay

## 2012-04-27 ENCOUNTER — Emergency Department (HOSPITAL_COMMUNITY)
Admission: EM | Admit: 2012-04-27 | Discharge: 2012-04-27 | Disposition: A | Discharge status: Home / Self Care | Payer: Self-pay | Attending: Emergency Medicine | Admitting: Emergency Medicine

## 2012-04-27 DIAGNOSIS — M170 Bilateral primary osteoarthritis of knee: Secondary | ICD-10-CM

## 2012-04-27 DIAGNOSIS — M25569 Pain in unspecified knee: Secondary | ICD-10-CM | POA: Insufficient documentation

## 2012-04-27 DIAGNOSIS — J45909 Unspecified asthma, uncomplicated: Secondary | ICD-10-CM | POA: Insufficient documentation

## 2012-04-27 DIAGNOSIS — IMO0002 Reserved for concepts with insufficient information to code with codable children: Secondary | ICD-10-CM | POA: Insufficient documentation

## 2012-04-27 DIAGNOSIS — M1710 Unilateral primary osteoarthritis, unspecified knee: Secondary | ICD-10-CM | POA: Insufficient documentation

## 2012-04-27 DIAGNOSIS — G8929 Other chronic pain: Secondary | ICD-10-CM | POA: Insufficient documentation

## 2012-04-27 HISTORY — DX: Unspecified osteoarthritis, unspecified site: M19.90

## 2012-04-27 MED ORDER — METHOCARBAMOL 500 MG PO TABS: 500.0000 mg | ORAL_TABLET | Freq: Two times a day (BID) | ORAL | Status: AC

## 2012-04-27 MED ORDER — DEXAMETHASONE SODIUM PHOSPHATE 10 MG/ML IJ SOLN
10.0000 mg | Freq: Once | INTRAMUSCULAR | Status: AC
  Administered 2012-04-27: 10 mg via INTRAMUSCULAR
  Filled 2012-04-27: qty 1

## 2012-04-27 MED ORDER — DICLOFENAC SODIUM 75 MG PO TBEC: 75.0000 mg | DELAYED_RELEASE_TABLET | Freq: Two times a day (BID) | ORAL | Status: DC

## 2012-04-27 MED ORDER — KETOROLAC TROMETHAMINE 60 MG/2ML IM SOLN
60.0000 mg | Freq: Once | INTRAMUSCULAR | Status: AC
  Administered 2012-04-27: 60 mg via INTRAMUSCULAR
  Filled 2012-04-27: qty 2

## 2012-04-27 MED ORDER — HYDROMORPHONE HCL PF 2 MG/ML IJ SOLN
1.0000 mg | Freq: Once | INTRAMUSCULAR | Status: AC
  Administered 2012-04-27: 1 mg via INTRAMUSCULAR
  Filled 2012-04-27: qty 1

## 2012-04-27 MED ORDER — OXYCODONE-ACETAMINOPHEN 5-325 MG PO TABS
1.0000 | ORAL_TABLET | Freq: Once | ORAL | Status: AC
  Administered 2012-04-27: 1 via ORAL
  Filled 2012-04-27: qty 1

## 2012-04-27 MED ORDER — HYDROCODONE-ACETAMINOPHEN 5-325 MG PO TABS: 1.0000 | ORAL_TABLET | Freq: Four times a day (QID) | ORAL | Status: AC | PRN

## 2012-04-27 NOTE — ED Provider Notes (Signed)
History     CSN: [id number]  Arrival date & time 04/27/12  1830   First MD Initiated Contact with Patient 04/27/12 1841     7:30PM HPI Patient reports severe bilateral knee pain that seemed to worsen today. Reports a history of chronic knee pain and right shoulder pain. Reports however only has unilateral knee pain but today has bilateral knee pain. Patient reports pain associated is so bad that he is unable to stand up. Patient reports he is an appointment in 5 days with his orthopedist for his shoulder. 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 States he has oxycodone, ibuprofen, and Naprosyn that he's been taking for pain without relief. Denies new injury, numbness, tingling, weakness, color change. Denies back pain, abdominal pain, nausea, vomiting  Patient is a 51 y.o. male presenting with knee pain. The history is provided by the patient.  Knee Pain This is a chronic problem. The problem occurs constantly. The problem has been gradually worsening. Pertinent negatives include no abdominal pain, chills, fever, headaches, joint swelling, nausea, numbness, vomiting or weakness. The symptoms are aggravated by standing, walking and bending. He has tried acetaminophen, NSAIDs and rest for the symptoms. The treatment provided no relief.    Past Medical History  Diagnosis Date  . Arthritis   . Asthma     Past Surgical History  Procedure Date  . Ankle surgery     History reviewed. No pertinent family history.  History  Substance Use Topics  . Smoking status: Current Everyday Smoker  . Smokeless tobacco: Not on file  . Alcohol Use: 1.2 oz/week    2 Cans of beer per week     occasionally      Review of Systems  Constitutional: Negative for fever and chills.  Gastrointestinal: Negative for nausea, vomiting and abdominal pain.  Musculoskeletal: Negative for back pain and joint swelling.       Positive for knee pain   Neurological: Negative for weakness, numbness and  headaches.  All other systems reviewed and are negative.    Allergies  Review of patient's allergies indicates no known allergies.  Home Medications   Current Outpatient Rx  Name Route Sig Dispense Refill  . IBUPROFEN 200 MG PO TABS Oral Take 400 mg by mouth every 8 (eight) hours as needed. For pain.    Marland Kitchen NAPROXEN 500 MG PO TABS Oral Take 500 mg by mouth 2 (two) times daily as needed. For pain    . OXYCODONE-ACETAMINOPHEN 5-325 MG PO TABS Oral Take 1 tablet by mouth every 6 (six) hours as needed. For pain      BP 155/102  Pulse 78  Temp(Src) 97.8 F (36.6 C) (Oral)  Resp 20  SpO2 97%  Physical Exam  Constitutional: He is oriented to person, place, and time. He appears well-developed and well-nourished.  HENT:  Head: Normocephalic and atraumatic.  Eyes: Pupils are equal, round, and reactive to light.  Musculoskeletal:       Right knee: He exhibits normal range of motion, no swelling, no effusion, no deformity, no laceration, no erythema, normal alignment, no LCL laxity, normal patellar mobility and no MCL laxity. tenderness found. Medial joint line tenderness noted.       Left knee: He exhibits normal range of motion, no swelling, no effusion, no ecchymosis, no deformity, no laceration, no erythema, normal alignment, no LCL laxity, normal patellar mobility and no MCL laxity. tenderness found. Medial joint line tenderness noted.       Patient  bilaterally has normal anterior and posterior drawer test. Pain reports significant pain with valgus and varus but also jumped at the slightest touch.  Neurological: He is alert and oriented to person, place, and time.  Skin: Skin is warm and dry. No rash noted. No erythema. No pallor.  Psychiatric: He has a normal mood and affect. His behavior is normal.    ED Course  Procedures   MDM   Suspect patient has severe arthritis. Now new injury to indicate need for xrayPatient's guardian has appointment scheduled in 5 days. Advised calling  his orthopedist to discuss that he will be seeing him for not only shoulder pain but also  worsening knee pain. Patient voices understanding. Pt states pain has improved with medication. Patient is ready for discharge.          Thomasene Lot, PA-C 04/27/12 2232

## 2012-04-27 NOTE — ED Notes (Addendum)
NT to bedside to attempt to ambulate pt.

## 2012-04-27 NOTE — Discharge Instructions (Signed)
Knee Pain The knee is the complex joint between your thigh and your lower leg. It is made up of bones, tendons, ligaments, and cartilage. The bones that make up the knee are:  The femur in the thigh.   The tibia and fibula in the lower leg.   The patella or kneecap riding in the groove on the lower femur.  CAUSES  Knee pain is a common complaint with many causes. A few of these causes are:  Injury, such as:   A ruptured ligament or tendon injury.   Torn cartilage.   Medical conditions, such as:   Gout   Arthritis   Infections   Overuse, over training or overdoing a physical activity.  Knee pain can be minor or severe. Knee pain can accompany debilitating injury. Minor knee problems often respond well to self-care measures or get well on their own. More serious injuries may need medical intervention or even surgery. SYMPTOMS The knee is complex. Symptoms of knee problems can vary widely. Some of the problems are:  Pain with movement and weight bearing.   Swelling and tenderness.   Buckling of the knee.   Inability to straighten or extend your knee.   Your knee locks and you cannot straighten it.   Warmth and redness with pain and fever.   Deformity or dislocation of the kneecap.  DIAGNOSIS  Determining what is wrong may be very straight forward such as when there is an injury. It can also be challenging because of the complexity of the knee. Tests to make a diagnosis may include:  Your caregiver taking a history and doing a physical exam.   Routine X-rays can be used to rule out other problems. X-rays will not reveal a cartilage tear. Some injuries of the knee can be diagnosed by:   Arthroscopy a surgical technique by which a small video camera is inserted through tiny incisions on the sides of the knee. This procedure is used to examine and repair internal knee joint problems. Tiny instruments can be used during arthroscopy to repair the torn knee cartilage  (meniscus).   Arthrography is a radiology technique. A contrast liquid is directly injected into the knee joint. Internal structures of the knee joint then become visible on X-ray film.   An MRI scan is a non x-ray radiology procedure in which magnetic fields and a computer produce two- or three-dimensional images of the inside of the knee. Cartilage tears are often visible using an MRI scanner. MRI scans have largely replaced arthrography in diagnosing cartilage tears of the knee.   Blood work.   Examination of the fluid that helps to lubricate the knee joint (synovial fluid). This is done by taking a sample out using a needle and a syringe.  TREATMENT The treatment of knee problems depends on the cause. Some of these treatments are:  Depending on the injury, proper casting, splinting, surgery or physical therapy care will be needed.   Give yourself adequate recovery time. Do not overuse your joints. If you begin to get sore during workout routines, back off. Slow down or do fewer repetitions.   For repetitive activities such as cycling or running, maintain your strength and nutrition.   Alternate muscle groups. For example if you are a weight lifter, work the upper body on one day and the lower body the next.   Either tight or weak muscles do not give the proper support for your knee. Tight or weak muscles do not absorb the stress placed   on the knee joint. Keep the muscles surrounding the knee strong.   Take care of mechanical problems.   If you have flat feet, orthotics or special shoes may help. See your caregiver if you need help.   Arch supports, sometimes with wedges on the inner or outer aspect of the heel, can help. These can shift pressure away from the side of the knee most bothered by osteoarthritis.   A brace called an "unloader" brace also may be used to help ease the pressure on the most arthritic side of the knee.   If your caregiver has prescribed crutches, braces,  wraps or ice, use as directed. The acronym for this is PRICE. This means protection, rest, ice, compression and elevation.   Nonsteroidal anti-inflammatory drugs (NSAID's), can help relieve pain. But if taken immediately after an injury, they may actually increase swelling. Take NSAID's with food in your stomach. Stop them if you develop stomach problems. Do not take these if you have a history of ulcers, stomach pain or bleeding from the bowel. Do not take without your caregiver's approval if you have problems with fluid retention, heart failure, or kidney problems.   For ongoing knee problems, physical therapy may be helpful.   Glucosamine and chondroitin are over-the-counter dietary supplements. Both may help relieve the pain of osteoarthritis in the knee. These medicines are different from the usual anti-inflammatory drugs. Glucosamine may decrease the rate of cartilage destruction.   Injections of a corticosteroid drug into your knee joint may help reduce the symptoms of an arthritis flare-up. They may provide pain relief that lasts a few months. You may have to wait a few months between injections. The injections do have a small increased risk of infection, water retention and elevated blood sugar levels.   Hyaluronic acid injected into damaged joints may ease pain and provide lubrication. These injections may work by reducing inflammation. A series of shots may give relief for as long as 6 months.   Topical painkillers. Applying certain ointments to your skin may help relieve the pain and stiffness of osteoarthritis. Ask your pharmacist for suggestions. Many over the-counter products are approved for temporary relief of arthritis pain.   In some countries, doctors often prescribe topical NSAID's for relief of chronic conditions such as arthritis and tendinitis. A review of treatment with NSAID creams found that they worked as well as oral medications but without the serious side effects.    PREVENTION  Maintain a healthy weight. Extra pounds put more strain on your joints.   Get strong, stay limber. Weak muscles are a common cause of knee injuries. Stretching is important. Include flexibility exercises in your workouts.   Be smart about exercise. If you have osteoarthritis, chronic knee pain or recurring injuries, you may need to change the way you exercise. This does not mean you have to stop being active. If your knees ache after jogging or playing basketball, consider switching to swimming, water aerobics or other low-impact activities, at least for a few days a week. Sometimes limiting high-impact activities will provide relief.   Make sure your shoes fit well. Choose footwear that is right for your sport.   Protect your knees. Use the proper gear for knee-sensitive activities. Use kneepads when playing volleyball or laying carpet. Buckle your seat belt every time you drive. Most shattered kneecaps occur in car accidents.   Rest when you are tired.  SEEK MEDICAL CARE IF:  You have knee pain that is continual and does not   seem to be getting better.  SEEK IMMEDIATE MEDICAL CARE IF:  Your knee joint feels hot to the touch and you have a high fever. MAKE SURE YOU:   Understand these instructions.   Will watch your condition.   Will get help right away if you are not doing well or get worse.  Document Released: 09/19/2007 Document Revised: 11/11/2011 Document Reviewed: 09/19/2007 Exit[employer] Patient Information 2012 [employer].  Degenerative Arthritis You have osteoarthritis. This is the wear and tear arthritis that comes with aging. It is also called degenerative arthritis. This is common in people past middle age. It is caused by stress on the joints. The large weight bearing joints of the lower extremities are most often affected. The knees, hips, back, neck, and hands can become painful, swollen, and stiff. This is the most common type of arthritis. It comes on with  age, carrying too much weight, or from an injury. Treatment includes resting the sore joint until the pain and swelling improve. Crutches or a walker may be needed for severe flares. Only take over-the-counter or prescription medicines for pain, discomfort, or fever as directed by your caregiver. Local heat therapy may improve motion. Cortisone shots into the joint are sometimes used to reduce pain and swelling during flares. Osteoarthritis is usually not crippling and progresses slowly. There are things you can do to decrease pain:  Avoid high impact activities.   Exercise regularly.   Low impact exercises such as walking, biking and swimming help to keep the muscles strong and keep normal joint function.   Stretching helps to keep your range of motion.   Lose weight if you are overweight. This reduces joint stress.  In severe cases when you have pain at rest or increasing disability, joint surgery may be helpful. See your caregiver for follow-up treatment as recommended.  SEEK IMMEDIATE MEDICAL CARE IF:   You have severe joint pain.   Marked swelling and redness in your joint develops.   You develop a high fever.  Document Released: 11/22/2005 Document Revised: 11/11/2011 Document Reviewed: 04/24/2007 Exit[employer] Patient Information 2012 [employer]. 

## 2012-04-27 NOTE — ED Notes (Signed)
Reassessed patients plan level/Pt states pain level has decreased to a 5

## 2012-04-27 NOTE — ED Notes (Signed)
Pt states that at 7:00 this morning he walked for some hrs and had to sit down and realized that he could not get back up, Pt states he has arthritis in both knees. Pt states he took 8 Ibuprofen from 7 this morning till 3pm and do not know the mg for each one. Pt states he also takes oxycodone for shoulder injuries but he states he has not taken any today. Pt states his pain level is 8 in each knee. Pt states his shoulder pain is a 10

## 2012-04-27 NOTE — ED Notes (Signed)
Pt stated that he did not have a way to get home. Bus route has ended for the day. Spoke with Chanin, RN and Jody, Child psychotherapist and they agreed to give patient a  Voucher to get him home. Address patient provided [address], [place] to his wife home Decorian Schuenemann

## 2012-04-27 NOTE — ED Notes (Signed)
PA to bedside.  Plan for pt to rest for a while more then try to walk.

## 2012-04-28 NOTE — ED Provider Notes (Signed)
Medical screening examination/treatment/procedure(s) were performed by non-physician practitioner and as supervising physician I was immediately available for consultation/collaboration.  Tamila Gaulin L Siegfried Vieth, MD 04/28/12 1602 

## 2013-01-17 ENCOUNTER — Emergency Department (HOSPITAL_COMMUNITY)
Admission: EM | Admit: 2013-01-17 | Discharge: 2013-01-17 | Disposition: A | Discharge status: Home / Self Care | Payer: Self-pay | Attending: Emergency Medicine | Admitting: Emergency Medicine

## 2013-01-17 ENCOUNTER — Encounter (HOSPITAL_COMMUNITY): Payer: Self-pay | Admitting: *Deleted

## 2013-01-17 DIAGNOSIS — R220 Localized swelling, mass and lump, head: Secondary | ICD-10-CM | POA: Insufficient documentation

## 2013-01-17 DIAGNOSIS — F17200 Nicotine dependence, unspecified, uncomplicated: Secondary | ICD-10-CM | POA: Insufficient documentation

## 2013-01-17 DIAGNOSIS — R21 Rash and other nonspecific skin eruption: Secondary | ICD-10-CM | POA: Insufficient documentation

## 2013-01-17 DIAGNOSIS — T7840XA Allergy, unspecified, initial encounter: Secondary | ICD-10-CM

## 2013-01-17 DIAGNOSIS — J45909 Unspecified asthma, uncomplicated: Secondary | ICD-10-CM | POA: Insufficient documentation

## 2013-01-17 MED ORDER — FAMOTIDINE 20 MG PO TABS
20.0000 mg | ORAL_TABLET | Freq: Once | ORAL | Status: AC
  Administered 2013-01-17: 20 mg via ORAL
  Filled 2013-01-17: qty 1

## 2013-01-17 MED ORDER — PREDNISONE 20 MG PO TABS
60.0000 mg | ORAL_TABLET | Freq: Once | ORAL | Status: AC
  Administered 2013-01-17: 60 mg via ORAL
  Filled 2013-01-17: qty 3

## 2013-01-17 MED ORDER — PREDNISONE 50 MG PO TABS: ORAL_TABLET | ORAL | Status: DC

## 2013-01-17 MED ORDER — FAMOTIDINE 20 MG PO TABS: 20.0000 mg | ORAL_TABLET | Freq: Two times a day (BID) | ORAL | Status: DC

## 2013-01-17 MED ORDER — EPINEPHRINE 0.3 MG/0.3ML IJ DEVI: 0.3000 mg | INTRAMUSCULAR | Status: DC | PRN

## 2013-01-17 MED ORDER — DIPHENHYDRAMINE HCL 25 MG PO CAPS
25.0000 mg | ORAL_CAPSULE | Freq: Once | ORAL | Status: AC
  Administered 2013-01-17: 25 mg via ORAL
  Filled 2013-01-17: qty 1

## 2013-01-17 MED ORDER — DIPHENHYDRAMINE HCL 25 MG PO TABS: 25.0000 mg | ORAL_TABLET | Freq: Four times a day (QID) | ORAL | Status: DC

## 2013-01-17 MED ORDER — EPINEPHRINE 0.3 MG/0.3ML IJ DEVI
0.3000 mg | Freq: Once | INTRAMUSCULAR | Status: AC
  Administered 2013-01-17: 0.3 mg via INTRAMUSCULAR
  Filled 2013-01-17: qty 0.3

## 2013-01-17 NOTE — Progress Notes (Signed)
   CARE MANAGEMENT ED NOTE 01/17/2013  Patient:  [facility] A   Account Number:  [id number]  Date Initiated:  01/17/2013  Documentation initiated by:  Fransico Michael  Subjective/Objective Assessment:   presented to ED with c/o itching and rash     Subjective/Objective Assessment Detail:     Action/Plan:   patient is self-pay and receives help at [facility]. Needs assistance with meds   Action/Plan Detail:   Anticipated DC Date:  01/17/2013     Status Recommendation to Physician:   Result of Recommendation:      DC Planning Services  CM consult  MATCH Program    Choice offered to / List presented to:            Status of service:  Completed, signed off  ED Comments:   ED Comments Detail:  01/17/13-1315-J.Edilson Vital,RN,BSN [phone]       Received call from [facility] with community assistance requesting assistance for patient with prescriptions. In to speak with patient. Receives assistance from [facility] and is a self pay patient with no insurance. Being sent home with prescriptions for epi pen, prednisone, benadryl, and pepcid. Elligible for [facility] program. Entered into [facility] program. Letter given to patient with instructions on how to get to [facility] outpatient pharmacy. Reports understanding. Copays overridden as patient is truly indigent and can not afford them.

## 2013-01-17 NOTE — ED Provider Notes (Signed)
History     CSN: [id number]  Arrival date & time 01/17/13  0750   First MD Initiated Contact with Patient 01/17/13 [phone]      Chief Complaint  Patient presents with  . Rash    Patient is a 52 y.o. male presenting with rash. The history is provided by the patient.  Rash Location: torso. Quality: itchiness   Severity:  Moderate Onset quality:  Gradual Duration:  2 days Timing:  Constant Progression:  Worsening Chronicity:  New Context comment:  Unknown Relieved by:  Nothing Worsened by:  Nothing tried Associated symptoms: no abdominal pain, no diarrhea, no fever, no hoarse voice, no shortness of breath, no tongue swelling and not wheezing   Associated symptoms comment:  Lip swelling    Past Medical History  Diagnosis Date  . Arthritis   . Asthma     Past Surgical History  Procedure Laterality Date  . Ankle surgery      History reviewed. No pertinent family history.  History  Substance Use Topics  . Smoking status: Current Every Day Smoker  . Smokeless tobacco: Not on file  . Alcohol Use: 1.2 oz/week    2 Cans of beer per week     Comment: occasionally      Review of Systems  Constitutional: Negative for fever.  HENT: Negative for hoarse voice.   Respiratory: Negative for shortness of breath and wheezing.   Gastrointestinal: Negative for abdominal pain and diarrhea.  Skin: Positive for rash.  Neurological: Negative for syncope and weakness.  All other systems reviewed and are negative.    Allergies  Review of patient's allergies indicates no known allergies.  Home Medications   Current Outpatient Rx  Name  Route  Sig  Dispense  Refill  . acetaminophen (TYLENOL) 500 MG tablet   Oral   Take 1,000 mg by mouth every 6 (six) hours as needed for pain (pain).         Marland Kitchen aspirin 81 MG chewable tablet   Oral   Chew 162 mg by mouth daily. For arthritis pain           BP 128/83  Pulse 91  Temp(Src) 98.1 F (36.7 C) (Oral)  Resp 19  SpO2  98% BP 124/76  Pulse 99  Temp(Src) 99.1 F (37.3 C) (Oral)  Resp 18  SpO2 98%   Physical Exam CONSTITUTIONAL: Well developed/well nourished HEAD AND FACE: Normocephalic/atraumatic EYES: EOMI/PERRL ENMT: Mucous membranes moist. Lip swelling noted.  No tongue swelling noted.  Voice normal, no stridor NECK: supple no meningeal signs SPINE:entire spine nontender CV: S1/S2 noted, no murmurs/rubs/gallops noted LUNGS: Lungs are clear to auscultation bilaterally, no apparent distress ABDOMEN: soft, nontender, no rebound or guarding GU:no cva tenderness NEURO: Pt is awake/alert, moves all extremitiesx4 EXTREMITIES: pulses normal, full ROM SKIN:urticaria to chest/abdominal wall PSYCH: no abnormalities of mood noted  ED Course  Procedures  8:56 AM Pt with rash that has now worsened with lip swelling.  He is unsure what caused this and denies h/o this in the past.  I have ordered epipnephrine/benadryl/steroids/pepcid.  Monitoring ordered.  Will follow closely 9:41 AM Pt currently resting comfortably, no new complaints 12:24 PM Pt observed for several hrs He feels improved.  No tongue swelling and his lips appear improved He reports his itching is better Feel he is safe for d/c I counseled him on allergic rxn and when to use epipen He will be seen by social work to assist with meds  MDM  Nursing  notes including past medical history and social history reviewed and considered in documentation         Joya Gaskins, MD 01/17/13 1225

## 2013-01-17 NOTE — ED Notes (Signed)
Reports itching rash to entire body and some facial swelling x 2 days, airway is intact, no resp distress noted.

## 2013-01-18 ENCOUNTER — Inpatient Hospital Stay (HOSPITAL_COMMUNITY)
Admission: EM | Admit: 2013-01-18 | Discharge: 2013-01-19 | DRG: 352 | Disposition: A | Discharge status: Home / Self Care | Payer: MEDICAID | Attending: Surgery | Admitting: Surgery

## 2013-01-18 ENCOUNTER — Emergency Department (HOSPITAL_COMMUNITY): Payer: Self-pay

## 2013-01-18 ENCOUNTER — Encounter (HOSPITAL_COMMUNITY): Payer: Self-pay | Admitting: Emergency Medicine

## 2013-01-18 DIAGNOSIS — Z7982 Long term (current) use of aspirin: Secondary | ICD-10-CM

## 2013-01-18 DIAGNOSIS — IMO0002 Reserved for concepts with insufficient information to code with codable children: Secondary | ICD-10-CM

## 2013-01-18 DIAGNOSIS — M129 Arthropathy, unspecified: Secondary | ICD-10-CM | POA: Diagnosis present

## 2013-01-18 DIAGNOSIS — Z79899 Other long term (current) drug therapy: Secondary | ICD-10-CM

## 2013-01-18 DIAGNOSIS — J45909 Unspecified asthma, uncomplicated: Secondary | ICD-10-CM | POA: Diagnosis present

## 2013-01-18 DIAGNOSIS — R030 Elevated blood-pressure reading, without diagnosis of hypertension: Secondary | ICD-10-CM | POA: Diagnosis not present

## 2013-01-18 DIAGNOSIS — F17200 Nicotine dependence, unspecified, uncomplicated: Secondary | ICD-10-CM | POA: Diagnosis present

## 2013-01-18 DIAGNOSIS — K4030 Unilateral inguinal hernia, with obstruction, without gangrene, not specified as recurrent: Secondary | ICD-10-CM

## 2013-01-18 LAB — BASIC METABOLIC PANEL
Calcium: 10 mg/dL (ref 8.4–10.5)
GFR calc Af Amer: >90 mL/min (ref >90)
GFR calc non Af Amer: >90 mL/min (ref >90)
Sodium: 134 mEq/L — ABNORMAL LOW (ref 135–145)

## 2013-01-18 LAB — CBC WITH DIFFERENTIAL/PLATELET
Basophils Absolute: 0 10*3/uL (ref 0.0–0.1)
Eosinophils Absolute: 0 10*3/uL (ref 0.0–0.7)
Eosinophils Relative: 0 % (ref 0–5)
MCH: 32.2 pg (ref 26.0–34.0)
MCV: 93.5 fL (ref 78.0–100.0)
Platelets: 132 10*3/uL — ABNORMAL LOW (ref 150–400)
RDW: 13.8 % (ref 11.5–15.5)
WBC: 8.9 10*3/uL (ref 4.0–10.5)

## 2013-01-18 MED ORDER — ONDANSETRON HCL 4 MG/2ML IJ SOLN
INTRAMUSCULAR | Status: AC
  Filled 2013-01-18: qty 2

## 2013-01-18 MED ORDER — ONDANSETRON HCL 4 MG/2ML IJ SOLN
4.0000 mg | Freq: Once | INTRAMUSCULAR | Status: AC
  Administered 2013-01-18: 4 mg via INTRAVENOUS

## 2013-01-18 MED ORDER — SODIUM CHLORIDE 0.9 % IV BOLUS (SEPSIS)
1000.0000 mL | Freq: Once | INTRAVENOUS | Status: AC
  Administered 2013-01-18: 1000 mL via INTRAVENOUS

## 2013-01-18 MED ORDER — HYDROMORPHONE HCL PF 1 MG/ML IJ SOLN
1.0000 mg | Freq: Once | INTRAMUSCULAR | Status: AC
  Administered 2013-01-18: 1 mg via INTRAVENOUS
  Filled 2013-01-18: qty 1

## 2013-01-18 MED ORDER — ONDANSETRON HCL 4 MG/2ML IJ SOLN
4.0000 mg | Freq: Once | INTRAMUSCULAR | Status: AC
  Administered 2013-01-18: 4 mg via INTRAVENOUS
  Filled 2013-01-18: qty 2

## 2013-01-18 MED ORDER — HYDROMORPHONE HCL PF 1 MG/ML IJ SOLN
1.0000 mg | INTRAMUSCULAR | Status: DC | PRN
  Administered 2013-01-18: 1 mg via INTRAVENOUS
  Filled 2013-01-18: qty 1

## 2013-01-18 NOTE — ED Notes (Signed)
Pt arrived by [facility] with c/o right side groin pain and generalized abd pain. Pt c/o knot to groin that started at 1600. C/o nausea but denies and vomiting.

## 2013-01-18 NOTE — ED Notes (Addendum)
Surgeon at bedside.  

## 2013-01-18 NOTE — ED Notes (Signed)
Pt SpO2 dropped to 76% at 1945 pt remained alert and oriented, denies SOB. Pt repositioned and pulse oximetry monitor checked and repositioned. SpO2 97% on RA.

## 2013-01-18 NOTE — ED Provider Notes (Signed)
History     CSN: [id number]  Arrival date & time 01/18/13  2018   First MD Initiated Contact with Patient 01/18/13 2031      Chief Complaint  Patient presents with  . Groin Pain  . Abdominal Pain    HPI  Patient presents with new groin pain.  Pain began approximately 5 hours ago.  Onset was sudden, since onset there is severe pain about the right testicle, right inguinal crease.  The pain is nonradiating, though the patient does have nausea during this no dysuria, hematuria, discharge from the penis, and the left side of the scrotum is largely unremarkable.  The patient denies a history of hernia, or testicular issues. He states that following evaluation here several days ago for rash he has been generally well.   Past Medical History  Diagnosis Date  . Arthritis   . Asthma     Past Surgical History  Procedure Laterality Date  . Ankle surgery      No family history on file.  History  Substance Use Topics  . Smoking status: Current Every Day Smoker  . Smokeless tobacco: Not on file  . Alcohol Use: 1.2 oz/week    2 Cans of beer per week     Comment: occasionally      Review of Systems  Constitutional:       Per HPI, otherwise negative  HENT:       Per HPI, otherwise negative  Respiratory:       Per HPI, otherwise negative  Cardiovascular:       Per HPI, otherwise negative  Gastrointestinal: Positive for nausea. Negative for vomiting and diarrhea.  Endocrine:       Negative aside from HPI  Genitourinary:       Neg aside from HPI   Musculoskeletal:       Per HPI, otherwise negative  Skin: Negative.   Neurological: Negative for syncope.    Allergies  Review of patient's allergies indicates no known allergies.  Home Medications   Current Outpatient Rx  Name  Route  Sig  Dispense  Refill  . acetaminophen (TYLENOL) 500 MG tablet   Oral   Take 1,000 mg by mouth every 6 (six) hours as needed for pain.          Marland Kitchen aspirin 81 MG chewable tablet    Oral   Chew 81 mg by mouth daily. For arthritis pain         . diphenhydrAMINE (BENADRYL) 25 MG tablet   Oral   Take 25 mg by mouth every 6 (six) hours as needed for itching.         Marland Kitchen EPINEPHrine (EPIPEN) 0.3 mg/0.3 mL DEVI   Intramuscular   Inject 0.3 mLs (0.3 mg total) into the muscle as needed.   2 Device   1   . famotidine (PEPCID) 20 MG tablet   Oral   Take 20 mg by mouth daily.         . predniSONE (DELTASONE) 50 MG tablet   Oral   Take 50 mg by mouth daily.           There were no vitals taken for this visit.  Physical Exam  Nursing note and vitals reviewed. Constitutional: He is oriented to person, place, and time. He appears well-developed. No distress.  HENT:  Head: Normocephalic and atraumatic.  Eyes: Conjunctivae and EOM are normal.  Pulmonary/Chest: Effort normal. No stridor. No respiratory distress.  Abdominal: He exhibits no distension.  Genitourinary:     Musculoskeletal: He exhibits no edema.  Neurological: He is alert and oriented to person, place, and time.  Skin: Skin is warm and dry.  Psychiatric: He has a normal mood and affect.    ED Course  Procedures (including critical care time)  Labs Reviewed  CBC WITH DIFFERENTIAL  BASIC METABOLIC PANEL   No results found.   No diagnosis found.  Update: Patient made aware of all results.  Pain seems to be well-controlled with Dilaudid.  MDM  This patient presents with new right groin pain, and on exam it is very uncomfortable with testicular pain, swelling.  Given the prominence of his testicular pain, though there was a proximal palpable mass, there suspicion for GU etiology, with consideration of hernia.  Ultrasound demonstrates an incarcerated hernia with edematous bowel.  I discussed the patient's case with our surgeon on call, who will assist with evaluation of the patient appeared       Gerhard Munch, MD 01/18/13 2310

## 2013-01-19 ENCOUNTER — Emergency Department (HOSPITAL_COMMUNITY): Payer: Self-pay | Admitting: Anesthesiology

## 2013-01-19 ENCOUNTER — Encounter (HOSPITAL_COMMUNITY): Payer: Self-pay | Admitting: Anesthesiology

## 2013-01-19 ENCOUNTER — Encounter (HOSPITAL_COMMUNITY): Payer: Self-pay | Admitting: General Surgery

## 2013-01-19 ENCOUNTER — Encounter (HOSPITAL_COMMUNITY): Admission: EM | Disposition: A | Discharge status: Home / Self Care | Payer: Self-pay | Source: Home / Self Care

## 2013-01-19 DIAGNOSIS — K4030 Unilateral inguinal hernia, with obstruction, without gangrene, not specified as recurrent: Secondary | ICD-10-CM

## 2013-01-19 HISTORY — PX: INGUINAL HERNIA REPAIR: SHX194

## 2013-01-19 SURGERY — REPAIR, HERNIA, INGUINAL, ADULT
Anesthesia: General | Site: Abdomen | Laterality: Right | Wound class: Clean

## 2013-01-19 MED ORDER — LACTATED RINGERS IV SOLN
INTRAVENOUS | Status: DC | PRN
  Administered 2013-01-19 (×2): via INTRAVENOUS

## 2013-01-19 MED ORDER — ENOXAPARIN SODIUM 30 MG/0.3ML ~~LOC~~ SOLN
40.0000 mg | SUBCUTANEOUS | Status: DC
  Filled 2013-01-19: qty 0.4

## 2013-01-19 MED ORDER — SODIUM CHLORIDE 0.9 % IR SOLN
Status: DC | PRN
  Administered 2013-01-19: 02:00:00

## 2013-01-19 MED ORDER — PROPOFOL 10 MG/ML IV BOLUS
INTRAVENOUS | Status: DC | PRN
  Administered 2013-01-19: 200 mg via INTRAVENOUS

## 2013-01-19 MED ORDER — KCL IN DEXTROSE-NACL 20-5-0.45 MEQ/L-%-% IV SOLN
INTRAVENOUS | Status: DC
  Filled 2013-01-19 (×3): qty 1000

## 2013-01-19 MED ORDER — EPHEDRINE SULFATE 50 MG/ML IJ SOLN
INTRAMUSCULAR | Status: DC | PRN
  Administered 2013-01-19: 10 mg via INTRAVENOUS

## 2013-01-19 MED ORDER — ROCURONIUM BROMIDE 100 MG/10ML IV SOLN
INTRAVENOUS | Status: DC | PRN
  Administered 2013-01-19 (×2): 20 mg via INTRAVENOUS
  Administered 2013-01-19: 30 mg via INTRAVENOUS

## 2013-01-19 MED ORDER — GLYCOPYRROLATE 0.2 MG/ML IJ SOLN
INTRAMUSCULAR | Status: DC | PRN
  Administered 2013-01-19: .8 mg via INTRAVENOUS

## 2013-01-19 MED ORDER — FENTANYL CITRATE 0.05 MG/ML IJ SOLN
INTRAMUSCULAR | Status: DC | PRN
  Administered 2013-01-19: 50 ug via INTRAVENOUS
  Administered 2013-01-19 (×2): 100 ug via INTRAVENOUS

## 2013-01-19 MED ORDER — LIDOCAINE HCL (CARDIAC) 20 MG/ML IV SOLN
INTRAVENOUS | Status: DC | PRN
  Administered 2013-01-19: 80 mg via INTRAVENOUS

## 2013-01-19 MED ORDER — ARTIFICIAL TEARS OP OINT
TOPICAL_OINTMENT | OPHTHALMIC | Status: DC | PRN
  Administered 2013-01-19: 1 via OPHTHALMIC

## 2013-01-19 MED ORDER — OXYCODONE-ACETAMINOPHEN 5-325 MG PO TABS
1.0000 | ORAL_TABLET | ORAL | Status: DC | PRN
  Administered 2013-01-19: 2 via ORAL
  Filled 2013-01-19: qty 2

## 2013-01-19 MED ORDER — ONDANSETRON HCL 4 MG/2ML IJ SOLN: 4.0000 mg | Freq: Once | INTRAMUSCULAR | Status: DC | PRN

## 2013-01-19 MED ORDER — BUPIVACAINE-EPINEPHRINE 0.25% -1:200000 IJ SOLN
INTRAMUSCULAR | Status: DC | PRN
  Administered 2013-01-19: 20 mL

## 2013-01-19 MED ORDER — OXYCODONE-ACETAMINOPHEN 5-325 MG PO TABS: 1.0000 | ORAL_TABLET | Freq: Four times a day (QID) | ORAL | Status: DC | PRN

## 2013-01-19 MED ORDER — 0.9 % SODIUM CHLORIDE (POUR BTL) OPTIME
TOPICAL | Status: DC | PRN
  Administered 2013-01-19: 1000 mL

## 2013-01-19 MED ORDER — ONDANSETRON HCL 4 MG/2ML IJ SOLN: 4.0000 mg | Freq: Four times a day (QID) | INTRAMUSCULAR | Status: DC | PRN

## 2013-01-19 MED ORDER — MIDAZOLAM HCL 5 MG/5ML IJ SOLN
INTRAMUSCULAR | Status: DC | PRN
  Administered 2013-01-19: 2 mg via INTRAVENOUS

## 2013-01-19 MED ORDER — FAMOTIDINE 20 MG PO TABS
20.0000 mg | ORAL_TABLET | Freq: Every day | ORAL | Status: DC
  Administered 2013-01-19: 20 mg via ORAL
  Filled 2013-01-19: qty 1

## 2013-01-19 MED ORDER — SODIUM CHLORIDE 0.9 % IV SOLN
INTRAVENOUS | Status: DC | PRN
  Administered 2013-01-19: 02:00:00 via INTRAVENOUS

## 2013-01-19 MED ORDER — NEOSTIGMINE METHYLSULFATE 1 MG/ML IJ SOLN
INTRAMUSCULAR | Status: DC | PRN
  Administered 2013-01-19: 5 mg via INTRAVENOUS

## 2013-01-19 MED ORDER — ONDANSETRON HCL 4 MG/2ML IJ SOLN
INTRAMUSCULAR | Status: DC | PRN
  Administered 2013-01-19: 4 mg via INTRAVENOUS

## 2013-01-19 MED ORDER — CEFAZOLIN SODIUM-DEXTROSE 2-3 GM-% IV SOLR
2.0000 g | Freq: Once | INTRAVENOUS | Status: AC
  Administered 2013-01-19: 2 g via INTRAVENOUS
  Filled 2013-01-19: qty 50

## 2013-01-19 MED ORDER — BUPIVACAINE-EPINEPHRINE 0.25% -1:200000 IJ SOLN
INTRAMUSCULAR | Status: AC
  Filled 2013-01-19: qty 1

## 2013-01-19 MED ORDER — DOCUSATE SODIUM 100 MG PO CAPS
100.0000 mg | ORAL_CAPSULE | Freq: Two times a day (BID) | ORAL | Status: DC
  Administered 2013-01-19: 100 mg via ORAL
  Filled 2013-01-19: qty 1

## 2013-01-19 MED ORDER — MORPHINE SULFATE 2 MG/ML IJ SOLN: 1.0000 mg | INTRAMUSCULAR | Status: DC | PRN

## 2013-01-19 MED ORDER — SUCCINYLCHOLINE CHLORIDE 20 MG/ML IJ SOLN
INTRAMUSCULAR | Status: DC | PRN
  Administered 2013-01-19: 140 mg via INTRAVENOUS

## 2013-01-19 MED ORDER — HYDROMORPHONE HCL PF 1 MG/ML IJ SOLN: 0.2500 mg | INTRAMUSCULAR | Status: DC | PRN

## 2013-01-19 MED ORDER — ONDANSETRON HCL 4 MG PO TABS: 4.0000 mg | ORAL_TABLET | Freq: Four times a day (QID) | ORAL | Status: DC | PRN

## 2013-01-19 SURGICAL SUPPLY — 52 items
ADH SKN CLS APL DERMABOND .7 (GAUZE/BANDAGES/DRESSINGS) ×1
APL SKNCLS STERI-STRIP NONHPOA (GAUZE/BANDAGES/DRESSINGS)
BENZOIN TINCTURE PRP APPL 2/3 (GAUZE/BANDAGES/DRESSINGS) ×1 IMPLANT
BLADE SURG 10 STRL SS (BLADE) ×2 IMPLANT
BLADE SURG 15 STRL LF DISP TIS (BLADE) ×1 IMPLANT
BLADE SURG 15 STRL SS (BLADE) ×2
BLADE SURG ROTATE 9660 (MISCELLANEOUS) ×1 IMPLANT
CANISTER SUCTION 2500CC (MISCELLANEOUS) IMPLANT
CHLORAPREP W/TINT 26ML (MISCELLANEOUS) ×2 IMPLANT
CLOTH BEACON ORANGE TIMEOUT ST (SAFETY) ×2 IMPLANT
COVER SURGICAL LIGHT HANDLE (MISCELLANEOUS) ×2 IMPLANT
DERMABOND ADVANCED (GAUZE/BANDAGES/DRESSINGS) ×1
DERMABOND ADVANCED .7 DNX12 (GAUZE/BANDAGES/DRESSINGS) IMPLANT
DRAIN PENROSE 1/2X12 LTX STRL (WOUND CARE) ×1 IMPLANT
DRAPE LAPAROTOMY TRNSV 102X78 (DRAPE) ×1 IMPLANT
DRAPE PED LAPAROTOMY (DRAPES) IMPLANT
DRAPE UTILITY 15X26 W/TAPE STR (DRAPE) ×4 IMPLANT
DRSG TEGADERM 4X4.75 (GAUZE/BANDAGES/DRESSINGS) ×1 IMPLANT
ELECT CAUTERY BLADE 6.4 (BLADE) ×2 IMPLANT
ELECT REM PT RETURN 9FT ADLT (ELECTROSURGICAL) ×2
ELECTRODE REM PT RTRN 9FT ADLT (ELECTROSURGICAL) ×1 IMPLANT
GLOVE BIOGEL M STRL SZ7.5 (GLOVE) ×2 IMPLANT
GLOVE BIOGEL PI IND STRL 8 (GLOVE) ×2 IMPLANT
GLOVE BIOGEL PI INDICATOR 8 (GLOVE) ×2
GOWN PREVENTION PLUS XLARGE (GOWN DISPOSABLE) ×2 IMPLANT
GOWN STRL NON-REIN LRG LVL3 (GOWN DISPOSABLE) ×2 IMPLANT
KIT BASIN OR (CUSTOM PROCEDURE TRAY) ×2 IMPLANT
KIT ROOM TURNOVER OR (KITS) ×2 IMPLANT
MESH ULTRAPRO 3X6 7.6X15CM (Mesh General) ×1 IMPLANT
NEEDLE HYPO 25GX1X1/2 BEV (NEEDLE) ×2 IMPLANT
NS IRRIG 1000ML POUR BTL (IV SOLUTION) ×2 IMPLANT
PACK SURGICAL SETUP 50X90 (CUSTOM PROCEDURE TRAY) ×2 IMPLANT
PAD ARMBOARD 7.5X6 YLW CONV (MISCELLANEOUS) ×4 IMPLANT
PENCIL BUTTON HOLSTER BLD 10FT (ELECTRODE) ×2 IMPLANT
SPECIMEN JAR SMALL (MISCELLANEOUS) IMPLANT
SPONGE GAUZE 4X4 12PLY (GAUZE/BANDAGES/DRESSINGS) IMPLANT
SPONGE INTESTINAL PEANUT (DISPOSABLE) ×2 IMPLANT
SPONGE LAP 18X18 X RAY DECT (DISPOSABLE) ×2 IMPLANT
STRIP CLOSURE SKIN 1/2X4 (GAUZE/BANDAGES/DRESSINGS) ×1 IMPLANT
SUT MNCRL AB 4-0 PS2 18 (SUTURE) ×2 IMPLANT
SUT PROLENE 2 0 CT2 30 (SUTURE) ×5 IMPLANT
SUT PROLENE 5 0 C1 (SUTURE) ×2 IMPLANT
SUT VIC AB 2-0 CT1 36 (SUTURE) ×2 IMPLANT
SUT VIC AB 3-0 SH 18 (SUTURE) ×2 IMPLANT
SUT VICRYL AB 3 0 TIES (SUTURE) ×1 IMPLANT
SYR BULB 3OZ (MISCELLANEOUS) ×2 IMPLANT
SYR CONTROL 10ML LL (SYRINGE) ×2 IMPLANT
TOWEL OR 17X24 6PK STRL BLUE (TOWEL DISPOSABLE) ×4 IMPLANT
TOWEL OR 17X26 10 PK STRL BLUE (TOWEL DISPOSABLE) ×2 IMPLANT
TUBE CONNECTING 12X1/4 (SUCTIONS) IMPLANT
WATER STERILE IRR 1000ML POUR (IV SOLUTION) IMPLANT
YANKAUER SUCT BULB TIP NO VENT (SUCTIONS) ×1 IMPLANT

## 2013-01-19 NOTE — Progress Notes (Deleted)
Day of Surgery  Subjective: Pt feels great today, up eating breakfast in the chair.  Very minimal pain, no trouble urinating.  Tolerating liquids well.    Objective: Vital signs in last 24 hours: Temp:  [97.7 F (36.5 C)-99.2 F (37.3 C)] 97.7 F (36.5 C) (02/14 0635) Pulse Rate:  [74-101] 79 (02/14 0635) Resp:  [14-20] 18 (02/14 0635) BP: (110-161)/(58-106) 110/61 mmHg (02/14 0635) SpO2:  [76 %-100 %] 98 % (02/14 0635)    Intake/Output from previous day: 02/13 0701 - 02/14 0700 In: 1500 [I.V.:1500] Out: 550 [Urine:450; Blood:100] Intake/Output this shift:    PE: Gen:  Alert, NAD, pleasant Abd: Soft, minimal tenderness to right groin and lower abdomen, ND, +BS, no HSM, incisions C/D/I   Lab Results:   Recent Labs  01/18/13 2054  WBC 8.9  HGB 14.3  HCT 41.5  PLT 132*   BMET  Recent Labs  01/18/13 2054  NA 134*  K 3.7  CL 96  CO2 23  GLUCOSE 129*  BUN 15  CREATININE 0.80  CALCIUM 10.0   PT/INR No results found for this basename: LABPROT, INR,  in the last 72 hours CMP     Component Value Date/Time   NA 134* 01/18/2013 2054   K 3.7 01/18/2013 2054   CL 96 01/18/2013 2054   CO2 23 01/18/2013 2054   GLUCOSE 129* 01/18/2013 2054   BUN 15 01/18/2013 2054   CREATININE 0.80 01/18/2013 2054   CALCIUM 10.0 01/18/2013 2054   PROT 7.2 03/22/2012 1812   ALBUMIN 4.1 03/22/2012 1812   AST 27 03/22/2012 1812   ALT 14 03/22/2012 1812   ALKPHOS 70 03/22/2012 1812   BILITOT 0.3 03/22/2012 1812   GFRNONAA >90 01/18/2013 2054   GFRAA >90 01/18/2013 2054   Lipase  No results found for this basename: lipase       Studies/Results: US Scrotum  01/18/2013  *RADIOLOGY REPORT*  Clinical Data: Right testicular pain and mass.  SCROTAL ULTRASOUND DOPPLER ULTRASOUND OF THE TESTICLES  Technique:  Complete ultrasound examination of the testicles, epididymis, and other scrotal structures was performed.  Color and spectral Doppler ultrasound were also utilized to evaluate blood flow to  the testicles.  Comparison:  None.  Findings:  The testicles are symmetric in size and echogenicity. The right testis measures 3.7 x 2.4 x 2.7 cm, while the left testis measures 3.9 x 1.9 x 3.1 cm.  No testicular masses are seen, and there is no evidence of microlithiasis.  The left epididymal head is unremarkable in appearance; the right epididymal head is not visualized due to the adjacent hernia.  Trace fluid about both testes remains within normal limits; no significant hydrocele is seen.  There is a large incarcerated right-sided inguinal hernia, superior to the right testis, displacing the testis inferiorly.  The visible portion of the inguinal hernia measures 7.4 cm in length.  The bowel wall appears mildly edematous.  Blood flow is seen within both testicles on color Doppler sonography.  Doppler spectral waveforms show both arterial and venous flow signal in both testicles.  IMPRESSION:  1.  Large incarcerated right-sided inguinal hernia, superior to the right testis, displacing the testis inferiorly.  The visible portion of the inguinal hernia measures 7.4 cm in length; the visualized bowel wall appears mildly edematous. 2.  Testes unremarkable in appearance.  No evidence for testicular torsion.   Original Report Authenticated By: Tonia Ghent, M.D.    [place] Art/ven Flow Abd Pelv Doppler  01/18/2013  *RADIOLOGY REPORT*  Clinical Data: Right testicular pain and mass.  SCROTAL ULTRASOUND DOPPLER ULTRASOUND OF THE TESTICLES  Technique:  Complete ultrasound examination of the testicles, epididymis, and other scrotal structures was performed.  Color and spectral Doppler ultrasound were also utilized to evaluate blood flow to the testicles.  Comparison:  None.  Findings:  The testicles are symmetric in size and echogenicity. The right testis measures 3.7 x 2.4 x 2.7 cm, while the left testis measures 3.9 x 1.9 x 3.1 cm.  No testicular masses are seen, and there is no evidence of microlithiasis.  The left  epididymal head is unremarkable in appearance; the right epididymal head is not visualized due to the adjacent hernia.  Trace fluid about both testes remains within normal limits; no significant hydrocele is seen.  There is a large incarcerated right-sided inguinal hernia, superior to the right testis, displacing the testis inferiorly.  The visible portion of the inguinal hernia measures 7.4 cm in length.  The bowel wall appears mildly edematous.  Blood flow is seen within both testicles on color Doppler sonography.  Doppler spectral waveforms show both arterial and venous flow signal in both testicles.  IMPRESSION:  1.  Large incarcerated right-sided inguinal hernia, superior to the right testis, displacing the testis inferiorly.  The visible portion of the inguinal hernia measures 7.4 cm in length; the visualized bowel wall appears mildly edematous. 2.  Testes unremarkable in appearance.  No evidence for testicular torsion.   Original Report Authenticated By: Tonia Ghent, M.D.     Anti-infectives: Anti-infectives   Start     Dose/Rate Route Frequency Ordered Stop   01/19/13 0219  polymyxin B 500,000 Units, bacitracin 50,000 Units in sodium chloride irrigation 0.9 % 500 mL irrigation  Status:  Discontinued       As needed 01/19/13 0220 01/19/13 0349   01/19/13 0030  [MAR Hold]  ceFAZolin (ANCEF) IVPB 2 g/50 mL premix     (On MAR Hold since 01/19/13 0135)   2 g 100 mL/hr over 30 Minutes Intravenous  Once 01/19/13 0019 01/19/13 0151       Assessment/Plan Incarcerated right indirect inguinal hernia - Open Repair of Incarcerated Right Inguinal Hernia with Mesh 1.  Pt feeling good, advance diet  2.  Pain control 3.  Ambulation and IS 4.  May be able to go home today if tolerating regular diet     LOS: 1 day    Aris [place] 01/19/2013, 8:37 AM Pager: [phone]

## 2013-01-19 NOTE — ED Notes (Signed)
Per Dr. Andrey Campanile with surgery, Ancef is to be held for OR.

## 2013-01-19 NOTE — Transfer of Care (Signed)
Immediate Anesthesia Transfer of Care Note  Patient: Frederick [name]  Procedure(s) Performed: Procedure(s): HERNIA REPAIR INGUINAL ADULT (Right)  Patient Location: PACU  Anesthesia Type:General  Level of Consciousness: sedated  Airway & Oxygen Therapy: Patient connected to nasal cannula oxygen  Post-op Assessment: Report given to PACU RN, Post -op Vital signs reviewed and stable and Patient moving all extremities X 4  Post vital signs: Reviewed and stable  Complications: No apparent anesthesia complications

## 2013-01-19 NOTE — H&P (Signed)
Frederick [name] is an 52 y.o. male.   Chief Complaint: groin pain HPI: 52 year old African American male walked to the emergency department earlier tonight because of acute onset of right groin pain earlier in the day. He states that he started having right groin pain between 1 to 4:00 on Thursday. He noticed an acute swelling in his groin. He denies any prior symptoms. He states that he wasn't doing anything at any particular activity. He states that he has felt nauseous. He did try to make himself vomit because he thought it might Make him feel better. His last bowel movement was earlier this morning. He has not had any flatus throughout the day. He denies any melena or hematochezia. He states that he is having severe stomach pain. He denies any dysuria. Denies any prior abdominal surgeries.  He states that he's currently trying to get disability because of bilateral knee pain. He is currently not working. He states that he drinks (2) 40s per day.   He denies any allergies and daily medications  Past Medical History  Diagnosis Date  . Arthritis   . Asthma     Past Surgical History  Procedure Laterality Date  . Ankle surgery      No family history on file. Social History:  reports that he has been smoking Cigarettes.  He has been smoking about 1.00 pack per day. He does not have any smokeless tobacco history on file. He reports that he drinks about 8.4 ounces of alcohol per week. He reports that he does not use illicit drugs.  Allergies: No Known Allergies   (Not in a hospital admission)  Results for orders placed during the hospital encounter of 01/18/13 (from the past 48 hour(s))  CBC WITH DIFFERENTIAL     Status: Abnormal   Collection Time    01/18/13  8:54 PM      Result Value Range   WBC 8.9  4.0 - 10.5 K/uL   RBC 4.44  4.22 - 5.81 MIL/uL   Hemoglobin 14.3  13.0 - 17.0 g/dL   HCT 14.7  82.9 - 56.2 %   MCV 93.5  78.0 - 100.0 fL   MCH 32.2  26.0 - 34.0 pg   MCHC 34.5  30.0 -  36.0 g/dL   RDW 13.0  86.5 - 78.4 %   Platelets 132 (*) 150 - 400 K/uL   Neutrophils Relative 90 (*) 43 - 77 %   Neutro Abs 8.0 (*) 1.7 - 7.7 K/uL   Lymphocytes Relative 7 (*) 12 - 46 %   Lymphs Abs 0.6 (*) 0.7 - 4.0 K/uL   Monocytes Relative 3  3 - 12 %   Monocytes Absolute 0.3  0.1 - 1.0 K/uL   Eosinophils Relative 0  0 - 5 %   Eosinophils Absolute 0.0  0.0 - 0.7 K/uL   Basophils Relative 0  0 - 1 %   Basophils Absolute 0.0  0.0 - 0.1 K/uL  BASIC METABOLIC PANEL     Status: Abnormal   Collection Time    01/18/13  8:54 PM      Result Value Range   Sodium 134 (*) 135 - 145 mEq/L   Potassium 3.7  3.5 - 5.1 mEq/L   Chloride 96  96 - 112 mEq/L   CO2 23  19 - 32 mEq/L   Glucose, Bld 129 (*) 70 - 99 mg/dL   BUN 15  6 - 23 mg/dL   Creatinine, Ser 6.96  0.50 -  1.35 mg/dL   Calcium 82.9  8.4 - 56.2 mg/dL   GFR calc non Af Amer >90  >90 mL/min   GFR calc Af Amer >90  >90 mL/min   Comment:            The eGFR has been calculated     using the CKD EPI equation.     This calculation has not been     validated in all clinical     situations.     eGFR's persistently     <90 mL/min signify     possible Chronic Kidney Disease.   US Scrotum  01/18/2013  *RADIOLOGY REPORT*  Clinical Data: Right testicular pain and mass.  SCROTAL ULTRASOUND DOPPLER ULTRASOUND OF THE TESTICLES  Technique:  Complete ultrasound examination of the testicles, epididymis, and other scrotal structures was performed.  Color and spectral Doppler ultrasound were also utilized to evaluate blood flow to the testicles.  Comparison:  None.  Findings:  The testicles are symmetric in size and echogenicity. The right testis measures 3.7 x 2.4 x 2.7 cm, while the left testis measures 3.9 x 1.9 x 3.1 cm.  No testicular masses are seen, and there is no evidence of microlithiasis.  The left epididymal head is unremarkable in appearance; the right epididymal head is not visualized due to the adjacent hernia.  Trace fluid about both  testes remains within normal limits; no significant hydrocele is seen.  There is a large incarcerated right-sided inguinal hernia, superior to the right testis, displacing the testis inferiorly.  The visible portion of the inguinal hernia measures 7.4 cm in length.  The bowel wall appears mildly edematous.  Blood flow is seen within both testicles on color Doppler sonography.  Doppler spectral waveforms show both arterial and venous flow signal in both testicles.  IMPRESSION:  1.  Large incarcerated right-sided inguinal hernia, superior to the right testis, displacing the testis inferiorly.  The visible portion of the inguinal hernia measures 7.4 cm in length; the visualized bowel wall appears mildly edematous. 2.  Testes unremarkable in appearance.  No evidence for testicular torsion.   Original Report Authenticated By: Tonia Ghent, M.D.    [place] Art/ven Flow Abd Pelv Doppler  01/18/2013  *RADIOLOGY REPORT*  Clinical Data: Right testicular pain and mass.  SCROTAL ULTRASOUND DOPPLER ULTRASOUND OF THE TESTICLES  Technique:  Complete ultrasound examination of the testicles, epididymis, and other scrotal structures was performed.  Color and spectral Doppler ultrasound were also utilized to evaluate blood flow to the testicles.  Comparison:  None.  Findings:  The testicles are symmetric in size and echogenicity. The right testis measures 3.7 x 2.4 x 2.7 cm, while the left testis measures 3.9 x 1.9 x 3.1 cm.  No testicular masses are seen, and there is no evidence of microlithiasis.  The left epididymal head is unremarkable in appearance; the right epididymal head is not visualized due to the adjacent hernia.  Trace fluid about both testes remains within normal limits; no significant hydrocele is seen.  There is a large incarcerated right-sided inguinal hernia, superior to the right testis, displacing the testis inferiorly.  The visible portion of the inguinal hernia measures 7.4 cm in length.  The bowel wall appears  mildly edematous.  Blood flow is seen within both testicles on color Doppler sonography.  Doppler spectral waveforms show both arterial and venous flow signal in both testicles.  IMPRESSION:  1.  Large incarcerated right-sided inguinal hernia, superior to the right testis, displacing the testis  inferiorly.  The visible portion of the inguinal hernia measures 7.4 cm in length; the visualized bowel wall appears mildly edematous. 2.  Testes unremarkable in appearance.  No evidence for testicular torsion.   Original Report Authenticated By: Tonia Ghent, M.D.     Review of Systems  Constitutional: Negative for fever, chills and weight loss.  HENT: Negative for hearing loss and nosebleeds.   Eyes: Positive for blurred vision. Negative for double vision, discharge and redness.  Respiratory: Negative for shortness of breath.   Cardiovascular: Negative for chest pain, palpitations, orthopnea, leg swelling and PND.       Some DOE.  Gastrointestinal: Positive for nausea and abdominal pain. Negative for diarrhea, constipation, blood in stool and melena.  Genitourinary: Negative for dysuria and urgency.  Musculoskeletal: Positive for joint pain.       Has b/l knee pain - trying to get on disability  Skin: Positive for rash.       Was in ED yesterday for skin rash - thinks related to food. Rash improved significantly  Neurological: Negative for tremors, seizures and loss of consciousness.       Denies amaurosis fugax, TIA  Psychiatric/Behavioral: Negative for suicidal ideas. The patient does not have insomnia.     Blood pressure 151/79, pulse 74, temperature 99.2 F (37.3 C), temperature source Oral, resp. rate 18, SpO2 97.00%. Physical Exam  Vitals reviewed. Constitutional: He is oriented to person, place, and time. He appears well-developed and well-nourished. He is cooperative. He appears ill. No distress.  HENT:  Head: Normocephalic and atraumatic.  Right Ear: External ear normal.  Left Ear:  External ear normal.  Eyes: Pupils are equal, round, and reactive to light. No scleral icterus.  Muddy sclera  Neck: Neck supple. No tracheal deviation present. No thyromegaly present.  Cardiovascular: Normal rate and normal heart sounds.   Respiratory: Effort normal and breath sounds normal. No respiratory distress. He has no wheezes.  GI: Soft. Bowel sounds are normal. He exhibits no distension. There is tenderness. There is no rebound and no guarding. A hernia is present. Hernia confirmed positive in the right inguinal area. Hernia confirmed negative in the left inguinal area.  Genitourinary: Penis normal.     Firm Tennis ball size lump in rt groin, TTP, RLQ TTP. Unable to reduce hernia in RT position.    Musculoskeletal: He exhibits no edema.  Lymphadenopathy:    He has no cervical adenopathy.  Neurological: He is alert and oriented to person, place, and time. He exhibits normal muscle tone.  Skin: Skin is warm and dry. No rash noted. He is not diaphoretic. No erythema.  Psychiatric: He has a normal mood and affect. His behavior is normal. Judgment and thought content normal.     Assessment/Plan Incarcerated non-reducible right inguinal hernia Elevated Blood Pressure Tobacco use ETOH use  We discussed the etiology of inguinal hernias. We discussed the signs & symptoms of incarceration & strangulation.  Since he is very tender to palpation in that area and because I am unable to reduce the hernia, I have recommended proceeding to the operating room early this morning for urgent repair of his right incarcerated inguinal hernia with mesh with possible bowel resection.   I described the procedure in detail.   We discussed the risks and benefits including but not limited to bleeding, infection, chronic inguinal pain, nerve entrapment, hernia recurrence, mesh complications, hematoma formation, urinary retention, injury to the testicles, numbness in the groin, blood clots, injury to the  surrounding  structures, and anesthesia risk. We also discussed the possibility of bowel resection which may or may not include anastomotic leak and anastomotic stricture. We also discussed the typical post operative recovery course, including no heavy lifting for 6 weeks.I also discussed with the patient that he is at higher risk for the hernia recurrence because of his smoking as well as the fact that we are operating on a strangulated hernia. He is also at increased risk for the above complications to the urgency of the procedure. I explained that the likelihood of improvement of their symptoms is good.   The patient has elected to proceed to the operating room IV antibiotics on-call to surgery along with SCDs  We will address his alcohol use postoperatively  Mary Sella. Andrey Campanile, MD, FACS General, Bariatric, & Minimally Invasive Surgery [facility], [place]   [facility] M 01/19/2013, 12:29 AM

## 2013-01-19 NOTE — Progress Notes (Signed)
Day of Surgery  Subjective: Doing well. Sitting in chair getting ready to eat. Pain well controlled. Feels a whole lot better. No n/v. Urinated already  Objective: Vital signs in last 24 hours: Temp:  [97.7 F (36.5 C)-99.2 F (37.3 C)] 97.7 F (36.5 C) (02/14 0635) Pulse Rate:  [74-101] 79 (02/14 0635) Resp:  [14-20] 18 (02/14 0635) BP: (110-161)/(58-106) 110/61 mmHg (02/14 0635) SpO2:  [76 %-100 %] 98 % (02/14 0635)    Intake/Output from previous day: 02/13 0701 - 02/14 0700 In: 1500 [I.V.:1500] Out: 550 [Urine:450; Blood:100] Intake/Output this shift:    Alert, nad cta b/l Reg Soft, nt, nd. Rt groin - incision c/d/i. Min swelling Palpable distal pulses  Lab Results:   Recent Labs  01/18/13 2054  WBC 8.9  HGB 14.3  HCT 41.5  PLT 132*   BMET  Recent Labs  01/18/13 2054  NA 134*  K 3.7  CL 96  CO2 23  GLUCOSE 129*  BUN 15  CREATININE 0.80  CALCIUM 10.0   PT/INR No results found for this basename: LABPROT, INR,  in the last 72 hours ABG No results found for this basename: PHART, PCO2, PO2, HCO3,  in the last 72 hours  Studies/Results: US Scrotum  01/18/2013  *RADIOLOGY REPORT*  Clinical Data: Right testicular pain and mass.  SCROTAL ULTRASOUND DOPPLER ULTRASOUND OF THE TESTICLES  Technique:  Complete ultrasound examination of the testicles, epididymis, and other scrotal structures was performed.  Color and spectral Doppler ultrasound were also utilized to evaluate blood flow to the testicles.  Comparison:  None.  Findings:  The testicles are symmetric in size and echogenicity. The right testis measures 3.7 x 2.4 x 2.7 cm, while the left testis measures 3.9 x 1.9 x 3.1 cm.  No testicular masses are seen, and there is no evidence of microlithiasis.  The left epididymal head is unremarkable in appearance; the right epididymal head is not visualized due to the adjacent hernia.  Trace fluid about both testes remains within normal limits; no significant hydrocele  is seen.  There is a large incarcerated right-sided inguinal hernia, superior to the right testis, displacing the testis inferiorly.  The visible portion of the inguinal hernia measures 7.4 cm in length.  The bowel wall appears mildly edematous.  Blood flow is seen within both testicles on color Doppler sonography.  Doppler spectral waveforms show both arterial and venous flow signal in both testicles.  IMPRESSION:  1.  Large incarcerated right-sided inguinal hernia, superior to the right testis, displacing the testis inferiorly.  The visible portion of the inguinal hernia measures 7.4 cm in length; the visualized bowel wall appears mildly edematous. 2.  Testes unremarkable in appearance.  No evidence for testicular torsion.   Original Report Authenticated By: Tonia Ghent, M.D.    [place] Art/ven Flow Abd Pelv Doppler  01/18/2013  *RADIOLOGY REPORT*  Clinical Data: Right testicular pain and mass.  SCROTAL ULTRASOUND DOPPLER ULTRASOUND OF THE TESTICLES  Technique:  Complete ultrasound examination of the testicles, epididymis, and other scrotal structures was performed.  Color and spectral Doppler ultrasound were also utilized to evaluate blood flow to the testicles.  Comparison:  None.  Findings:  The testicles are symmetric in size and echogenicity. The right testis measures 3.7 x 2.4 x 2.7 cm, while the left testis measures 3.9 x 1.9 x 3.1 cm.  No testicular masses are seen, and there is no evidence of microlithiasis.  The left epididymal head is unremarkable in appearance; the right epididymal head  is not visualized due to the adjacent hernia.  Trace fluid about both testes remains within normal limits; no significant hydrocele is seen.  There is a large incarcerated right-sided inguinal hernia, superior to the right testis, displacing the testis inferiorly.  The visible portion of the inguinal hernia measures 7.4 cm in length.  The bowel wall appears mildly edematous.  Blood flow is seen within both testicles on  color Doppler sonography.  Doppler spectral waveforms show both arterial and venous flow signal in both testicles.  IMPRESSION:  1.  Large incarcerated right-sided inguinal hernia, superior to the right testis, displacing the testis inferiorly.  The visible portion of the inguinal hernia measures 7.4 cm in length; the visualized bowel wall appears mildly edematous. 2.  Testes unremarkable in appearance.  No evidence for testicular torsion.   Original Report Authenticated By: Tonia Ghent, M.D.     Anti-infectives: Anti-infectives   Start     Dose/Rate Route Frequency Ordered Stop   01/19/13 0219  polymyxin B 500,000 Units, bacitracin 50,000 Units in sodium chloride irrigation 0.9 % 500 mL irrigation  Status:  Discontinued       As needed 01/19/13 0220 01/19/13 0349   01/19/13 0030  [MAR Hold]  ceFAZolin (ANCEF) IVPB 2 g/50 mL premix     (On MAR Hold since 01/19/13 0135)   2 g 100 mL/hr over 30 Minutes Intravenous  Once 01/19/13 0019 01/19/13 0151      Assessment/Plan: s/p Procedure(s): HERNIA REPAIR INGUINAL ADULT (Right)  doing well Discussed d/c instructions.  If tolerates breakfast and lunch can probably go home later today. Will re-evaluate pt later today. May need to stay til sat. Ambulate IS  Mary Sella. Andrey Campanile, MD, FACS General, Bariatric, & Minimally Invasive Surgery [facility], [place]   LOS: 1 day    [name] 01/19/2013

## 2013-01-19 NOTE — Anesthesia Preprocedure Evaluation (Signed)
Anesthesia Evaluation  Patient identified by MRN, date of birth, ID band Patient awake    Reviewed: Allergy & Precautions, H&P , NPO status , Patient's Chart, lab work & pertinent test results  Airway Mallampati: I TM Distance: >3 FB Neck ROM: full    Dental   Pulmonary          Cardiovascular Rhythm:regular Rate:Normal     Neuro/Psych    GI/Hepatic   Endo/Other    Renal/GU      Musculoskeletal   Abdominal   Peds  Hematology   Anesthesia Other Findings   Reproductive/Obstetrics                           Anesthesia Physical Anesthesia Plan  ASA: I  Anesthesia Plan: General   Post-op Pain Management:    Induction: Intravenous  Airway Management Planned: Oral ETT  Additional Equipment:   Intra-op Plan:   Post-operative Plan: Extubation in OR  Informed Consent: I have reviewed the patients History and Physical, chart, labs and discussed the procedure including the risks, benefits and alternatives for the proposed anesthesia with the patient or authorized representative who has indicated his/her understanding and acceptance.     Plan Discussed with: CRNA, Anesthesiologist and Surgeon  Anesthesia Plan Comments:         Anesthesia Quick Evaluation  

## 2013-01-19 NOTE — Discharge Summary (Signed)
Physician Discharge Summary  Patient ID: Frederick [name] MRN: [id number] DOB/AGE: [id number] 53 y.o.  Admit date: 01/18/2013 Discharge date: 01/19/2013  Admitting Diagnosis: Incarcerated right indirect inguinal hernia ETOH use Tobacco use Elevated BP  Discharge Diagnosis Incarcerated right indirect inguinal hernia ETOH use Tobacco use Elevated BP  Consultants None  Imaging: US Scrotum  01/18/2013  *RADIOLOGY REPORT*  Clinical Data: Right testicular pain and mass.  SCROTAL ULTRASOUND DOPPLER ULTRASOUND OF THE TESTICLES  Technique:  Complete ultrasound examination of the testicles, epididymis, and other scrotal structures was performed.  Color and spectral Doppler ultrasound were also utilized to evaluate blood flow to the testicles.  Comparison:  None.  Findings:  The testicles are symmetric in size and echogenicity. The right testis measures 3.7 x 2.4 x 2.7 cm, while the left testis measures 3.9 x 1.9 x 3.1 cm.  No testicular masses are seen, and there is no evidence of microlithiasis.  The left epididymal head is unremarkable in appearance; the right epididymal head is not visualized due to the adjacent hernia.  Trace fluid about both testes remains within normal limits; no significant hydrocele is seen.  There is a large incarcerated right-sided inguinal hernia, superior to the right testis, displacing the testis inferiorly.  The visible portion of the inguinal hernia measures 7.4 cm in length.  The bowel wall appears mildly edematous.  Blood flow is seen within both testicles on color Doppler sonography.  Doppler spectral waveforms show both arterial and venous flow signal in both testicles.  IMPRESSION:  1.  Large incarcerated right-sided inguinal hernia, superior to the right testis, displacing the testis inferiorly.  The visible portion of the inguinal hernia measures 7.4 cm in length; the visualized bowel wall appears mildly edematous. 2.  Testes unremarkable in appearance.  No evidence  for testicular torsion.   Original Report Authenticated By: Tonia Ghent, M.D.    [place] Art/ven Flow Abd Pelv Doppler  01/18/2013  *RADIOLOGY REPORT*  Clinical Data: Right testicular pain and mass.  SCROTAL ULTRASOUND DOPPLER ULTRASOUND OF THE TESTICLES  Technique:  Complete ultrasound examination of the testicles, epididymis, and other scrotal structures was performed.  Color and spectral Doppler ultrasound were also utilized to evaluate blood flow to the testicles.  Comparison:  None.  Findings:  The testicles are symmetric in size and echogenicity. The right testis measures 3.7 x 2.4 x 2.7 cm, while the left testis measures 3.9 x 1.9 x 3.1 cm.  No testicular masses are seen, and there is no evidence of microlithiasis.  The left epididymal head is unremarkable in appearance; the right epididymal head is not visualized due to the adjacent hernia.  Trace fluid about both testes remains within normal limits; no significant hydrocele is seen.  There is a large incarcerated right-sided inguinal hernia, superior to the right testis, displacing the testis inferiorly.  The visible portion of the inguinal hernia measures 7.4 cm in length.  The bowel wall appears mildly edematous.  Blood flow is seen within both testicles on color Doppler sonography.  Doppler spectral waveforms show both arterial and venous flow signal in both testicles.  IMPRESSION:  1.  Large incarcerated right-sided inguinal hernia, superior to the right testis, displacing the testis inferiorly.  The visible portion of the inguinal hernia measures 7.4 cm in length; the visualized bowel wall appears mildly edematous. 2.  Testes unremarkable in appearance.  No evidence for testicular torsion.   Original Report Authenticated By: Tonia Ghent, M.D.     Procedures Dr. Andrey Campanile (01/19/13) -  Open Repair of Incarcerated Right Inguinal Hernia with Mesh  Hospital Course:  52 year old African American male walked to [facility] on 01/19/13 because of acute onset of  right groin pain earlier in the day. He states that he started having right groin pain between 1 to 4:00 on Thursday. He noticed an acute swelling in his groin. He denies any prior symptoms. He states that he wasn't doing anything at any particular activity. He states that he has felt nauseous. He did try to make himself vomit because he thought it might make him feel better. His last bowel movement was earlier this morning. He has not had any flatus throughout the day. He denies any melena or hematochezia. He states that he is having severe stomach pain. He denies any dysuria. Denies any prior abdominal surgeries.  He states that he's currently trying to get disability because of bilateral knee pain. He is currently not working. He states that he drinks (2) 40s per day.   Workup showed an incarcerated non-reducible right inguinal hernia.  Patient was admitted and underwent procedure listed above.  Tolerated procedure well and was transferred to the floor.  Diet was advanced as tolerated.  On POD #1, the patient was voiding well, tolerating diet, ambulating well, pain well controlled, vital signs stable, incisions c/d/i and felt stable for discharge home.  Patient will follow up in our office with Dr. Andrey Campanile in 2-3 weeks and knows to call with questions or concerns.     Medication List    STOP taking these medications       acetaminophen 500 MG tablet  Commonly known as:  TYLENOL      TAKE these medications       aspirin 81 MG chewable tablet  Chew 81 mg by mouth daily. For arthritis pain     diphenhydrAMINE 25 MG tablet  Commonly known as:  BENADRYL  Take 25 mg by mouth every 6 (six) hours as needed for itching.     EPINEPHrine 0.3 mg/0.3 mL Devi  Commonly known as:  EPIPEN  Inject 0.3 mLs (0.3 mg total) into the muscle as needed.     famotidine 20 MG tablet  Commonly known as:  PEPCID  Take 20 mg by mouth daily.     oxyCODONE-acetaminophen 5-325 MG per tablet  Commonly known as:   PERCOCET/ROXICET  Take 1-2 tablets by mouth every 6 (six) hours as needed.     predniSONE 50 MG tablet  Commonly known as:  DELTASONE  Take 50 mg by mouth daily.             Follow-up Information   Follow up with [name], MD,FACS. Schedule an appointment as soon as possible for a visit in 2 weeks.   Contact information:   [address] [place] [address] [phone]       Signed: Candiss Norse [facility] [phone]  01/19/2013, 1:15 PM

## 2013-01-19 NOTE — Progress Notes (Signed)
Discharge patient home. Home discharge instruction given , verbalizes understanding.

## 2013-01-19 NOTE — Op Note (Signed)
Open Repair of Incarcerated Right Inguinal Hernia with Mesh Procedure Note  Indications: The patient presented with a history of a right, not reducible hernia - incarcerated inguinal hernia. Please see my H&P  Pre-operative Diagnosis: right (not reducible) incarcerated   Post-operative Diagnosis: incarcerated right indirect inguinal hernia  Surgeon: [name]   Assistants: none  Anesthesia: General endotracheal anesthesia and Local anesthesia 0.25.% bupivacaine, with epinephrine  ASA Class: 1   Surgeon: Mary Sella. Andrey Campanile, MD, FACS  Procedure Details  The patient was seen again in the Holding Room. The risks, benefits, complications, treatment options, and expected outcomes were discussed with the patient. The possibilities of reaction to medication, pulmonary aspiration, perforation of viscus, bleeding, recurrent infection, the need for additional procedures, and development of a complication requiring transfusion or further operation were discussed with the patient and/or family. The likelihood of success in repairing the hernia and returning the patient to their previous functional status is good.  There was concurrence with the proposed plan, and informed consent was obtained. The site of surgery was properly noted/marked. The patient was taken to the Operating Room, identified as [name], and the procedure verified as right inguinal hernia repair. A Time Out was held and the above information confirmed. The patient received IV antibiotics prior to skin incision.  The patient was placed in the supine position and underwent induction of anesthesia. The lower abdomen and groin was prepped with Chloraprep (penis and scrotum prepped with Betadine) and draped in the standard fashion. The patient had a large firm bulge in his inguinal canal.  An oblique incision was made from just above the pubic tubercle in midline and carried laterally initially for 6cm but later enlarged to about 9 cm.  Dissection was carried down through the subcutaneous tissue with cautery to the external oblique fascia.  We opened the external oblique fascia along the direction of its fibers to the external ring.  There was a firm bulge of small bowel. The hernia sac was opened exposing the small bowel which was hyperemic but viable. There was some simple clear fluid in the hernia sac. The right testicle and epididymitis had been brought up out of the scrotum. The indirect defect was quite small and I had to enlarge the ring to reduce the bowel. By this time, the small segment of small bowel was not hyperemic and still looked viable so it was reduced to the peritoneal cavity. The hernia sac was thickened and stuck to the cord structures. The cremasteric fibers were taken down with electrocautery along with a small nerve.  The spermatic cord was circumferentially dissected bluntly and retracted with a Penrose drain.  The floor of the inguinal canal was inspected and there was no direct defect.  The wound was irrigated with triple antibiotic irrigation.  We used a 3 x 6 inch piece of Ultrapro mesh, which was cut into a keyhole shape.  This was secured with 2-0 Prolene, beginning at the pubic tubercle, running this along the shelving edge inferiorly. Out laterally, I did knick the artery. Hemostasis was achieved by placing a superficial figure of eight suture thru the area with a 5-0 Prolene suture. There was an excellent palpable pulse distally. Superiorly, the mesh was secured to the internal oblique fascia with interrupted 2-0 Prolene sutures.  The tails of the mesh were sutured together behind the spermatic cord.  The mesh was tucked underneath the external oblique fascia laterally.  Local was infiltrated in all tissue layers. The arterial repair  was inspected and there was no additional bleeding and there was an excellent pulse distally. The external oblique fascia was reapproximated with 2-0 Vicryl.  3-0 Vicryl was used to  close the subcutaneous tissues and 4-0 Monocryl was used to close the skin in subcuticular fashion.  Dermabond was used to seal the incision.  The patient was then extubated and brought to the recovery room in stable condition.  All sponge, instrument, and needle counts were correct prior to closure and at the conclusion of the case.   Estimated Blood Loss: less than 100 mL                 Complications: None; patient tolerated the procedure well.         Disposition: PACU - hemodynamically stable.         Condition: stable  Mary Sella. Andrey Campanile, MD, FACS General, Bariatric, & Minimally Invasive Surgery [facility], [place]

## 2013-01-19 NOTE — Anesthesia Procedure Notes (Signed)
Procedure Name: Intubation Date/Time: 01/19/2013 1:40 AM Performed by: Molli Hazard Pre-anesthesia Checklist: Patient identified, Emergency Drugs available, Suction available and Patient being monitored Patient Re-evaluated:Patient Re-evaluated prior to inductionOxygen Delivery Method: Circle system utilized Preoxygenation: Pre-oxygenation with 100% oxygen Intubation Type: IV induction, Rapid sequence and Cricoid Pressure applied Laryngoscope Size: Miller and 2 Grade View: Grade II Tube type: Oral Tube size: 7.5 mm Number of attempts: 1 Airway Equipment and Method: Stylet Placement Confirmation: ETT inserted through vocal cords under direct vision,  positive ETCO2 and breath sounds checked- equal and bilateral Secured at: 23 cm Tube secured with: Tape Dental Injury: Teeth and Oropharynx as per pre-operative assessment

## 2013-01-19 NOTE — Preoperative (Signed)
Beta Blockers   Reason not to administer Beta Blockers:Not Applicable 

## 2013-01-21 ENCOUNTER — Inpatient Hospital Stay (HOSPITAL_COMMUNITY)
Admission: EM | Admit: 2013-01-21 | Discharge: 2013-01-25 | DRG: 493 | Disposition: A | Discharge status: Home / Self Care | Payer: MEDICAID | Attending: Orthopedic Surgery | Admitting: Orthopedic Surgery

## 2013-01-21 ENCOUNTER — Emergency Department (HOSPITAL_COMMUNITY): Payer: Self-pay

## 2013-01-21 ENCOUNTER — Encounter (HOSPITAL_COMMUNITY): Payer: Self-pay | Admitting: Emergency Medicine

## 2013-01-21 DIAGNOSIS — S82302A Unspecified fracture of lower end of left tibia, initial encounter for closed fracture: Secondary | ICD-10-CM

## 2013-01-21 DIAGNOSIS — S82409A Unspecified fracture of shaft of unspecified fibula, initial encounter for closed fracture: Secondary | ICD-10-CM | POA: Diagnosis present

## 2013-01-21 DIAGNOSIS — F1020 Alcohol dependence, uncomplicated: Secondary | ICD-10-CM | POA: Diagnosis present

## 2013-01-21 DIAGNOSIS — Z23 Encounter for immunization: Secondary | ICD-10-CM

## 2013-01-21 DIAGNOSIS — K469 Unspecified abdominal hernia without obstruction or gangrene: Secondary | ICD-10-CM | POA: Diagnosis present

## 2013-01-21 DIAGNOSIS — S82109A Unspecified fracture of upper end of unspecified tibia, initial encounter for closed fracture: Secondary | ICD-10-CM | POA: Diagnosis present

## 2013-01-21 DIAGNOSIS — F17200 Nicotine dependence, unspecified, uncomplicated: Secondary | ICD-10-CM | POA: Diagnosis present

## 2013-01-21 DIAGNOSIS — J45909 Unspecified asthma, uncomplicated: Secondary | ICD-10-CM | POA: Diagnosis present

## 2013-01-21 DIAGNOSIS — S82899A Other fracture of unspecified lower leg, initial encounter for closed fracture: Principal | ICD-10-CM | POA: Diagnosis present

## 2013-01-21 DIAGNOSIS — Z79899 Other long term (current) drug therapy: Secondary | ICD-10-CM

## 2013-01-21 DIAGNOSIS — K5900 Constipation, unspecified: Secondary | ICD-10-CM | POA: Diagnosis present

## 2013-01-21 DIAGNOSIS — S82839A Other fracture of upper and lower end of unspecified fibula, initial encounter for closed fracture: Secondary | ICD-10-CM

## 2013-01-21 DIAGNOSIS — S82309A Unspecified fracture of lower end of unspecified tibia, initial encounter for closed fracture: Secondary | ICD-10-CM

## 2013-01-21 DIAGNOSIS — F1010 Alcohol abuse, uncomplicated: Secondary | ICD-10-CM

## 2013-01-21 DIAGNOSIS — Y92009 Unspecified place in unspecified non-institutional (private) residence as the place of occurrence of the external cause: Secondary | ICD-10-CM

## 2013-01-21 DIAGNOSIS — W010XXA Fall on same level from slipping, tripping and stumbling without subsequent striking against object, initial encounter: Secondary | ICD-10-CM | POA: Diagnosis present

## 2013-01-21 DIAGNOSIS — M1710 Unilateral primary osteoarthritis, unspecified knee: Secondary | ICD-10-CM | POA: Diagnosis present

## 2013-01-21 DIAGNOSIS — Z7982 Long term (current) use of aspirin: Secondary | ICD-10-CM

## 2013-01-21 HISTORY — DX: Unspecified fracture of shaft of unspecified fibula, initial encounter for closed fracture: S82.409A

## 2013-01-21 HISTORY — DX: Unspecified abdominal hernia without obstruction or gangrene: K46.9

## 2013-01-21 HISTORY — DX: Alcohol dependence, uncomplicated: F10.20

## 2013-01-21 LAB — CBC
HCT: 39.1 % (ref 39.0–52.0)
Hemoglobin: 13.2 g/dL (ref 13.0–17.0)
MCH: 32.1 pg (ref 26.0–34.0)
MCHC: 33.8 g/dL (ref 30.0–36.0)
MCV: 95.1 fL (ref 78.0–100.0)
RBC: 4.11 MIL/uL — ABNORMAL LOW (ref 4.22–5.81)

## 2013-01-21 LAB — BASIC METABOLIC PANEL
BUN: 11 mg/dL (ref 6–23)
CO2: 27 mEq/L (ref 19–32)
Calcium: 8.8 mg/dL (ref 8.4–10.5)
Creatinine, Ser: 0.71 mg/dL (ref 0.50–1.35)
GFR calc non Af Amer: >90 mL/min (ref >90)
Glucose, Bld: 95 mg/dL (ref 70–99)
Sodium: 134 mEq/L — ABNORMAL LOW (ref 135–145)

## 2013-01-21 MED ORDER — FOLIC ACID 1 MG PO TABS
1.0000 mg | ORAL_TABLET | Freq: Every day | ORAL | Status: DC
  Administered 2013-01-22 – 2013-01-25 (×4): 1 mg via ORAL
  Filled 2013-01-21 (×4): qty 1

## 2013-01-21 MED ORDER — THIAMINE HCL 100 MG/ML IJ SOLN
100.0000 mg | Freq: Every day | INTRAMUSCULAR | Status: DC
  Filled 2013-01-21 (×4): qty 1

## 2013-01-21 MED ORDER — LORAZEPAM 2 MG/ML IJ SOLN: 1.0000 mg | Freq: Four times a day (QID) | INTRAMUSCULAR | Status: AC | PRN

## 2013-01-21 MED ORDER — VITAMIN B-1 100 MG PO TABS
100.0000 mg | ORAL_TABLET | Freq: Every day | ORAL | Status: DC
  Administered 2013-01-22 – 2013-01-25 (×4): 100 mg via ORAL
  Filled 2013-01-21 (×4): qty 1

## 2013-01-21 MED ORDER — ADULT MULTIVITAMIN W/MINERALS CH
1.0000 | ORAL_TABLET | Freq: Every day | ORAL | Status: DC
  Administered 2013-01-22 – 2013-01-25 (×4): 1 via ORAL
  Filled 2013-01-21 (×4): qty 1

## 2013-01-21 MED ORDER — LORAZEPAM 1 MG PO TABS: 1.0000 mg | ORAL_TABLET | Freq: Four times a day (QID) | ORAL | Status: AC | PRN

## 2013-01-21 NOTE — ED Provider Notes (Signed)
Medical screening examination/treatment/procedure(s) were performed by non-physician practitioner and as supervising physician I was immediately available for consultation/collaboration.  Raeford Razor, MD 01/21/13 2329

## 2013-01-21 NOTE — ED Notes (Signed)
Pt sts slid on gravel and fell, pt sts he heard ankle pop. Pt left ankle is deformed. Pt has ETOH on board.VSS.good peripheral  pulses

## 2013-01-21 NOTE — ED Notes (Signed)
WUJ:[id number]<JY> Expected date:<BR> Expected time:<BR> Means of arrival:<BR> Comments:<BR> Rm 6, PTAR 51 M, Fall/Ankle Deformity, ETOH

## 2013-01-21 NOTE — ED Provider Notes (Signed)
History     CSN: [id number]  Arrival date & time 01/21/13  1958   First MD Initiated Contact with Patient 01/21/13 2005      Chief Complaint  Patient presents with  . Ankle Pain    (Consider location/radiation/quality/duration/timing/severity/associated sxs/prior treatment) HPI Comments: Frederick [name] is a 52 y.o. male with history of substance abuse with alcohol, orthopedic noncompliance, left distal fibular fracture and arthritis presents emergency department complaining of left ankle pain.  Onset of symptoms began just prior to arrival after patient slipped on "black eyes".  Patient states he has been drinking alcohol today, but does not recall how much. He reports inability to ambulate after fall.  Height was approximately 3-5 feet on concrete.  Patient's last injury to this extremity was repaired surgically with an open reduction and fixation performed by Dr. Dion Saucier.  Patient states pain severity is currently 2/10.  He denies hitting his head having any loss of consciousness, any blood loss, headaches, change in vision, and decreased sensation.  Patient has no other complaints at this time.  Note patient is currently not on any blood thinners.   Left ORIF performed 02/26/2011- ATTENDING SURGEON:  [name], MD OPERATIVE IMPLANT:  Synthes one-third tubular plate 5-hole with an interfragmentary lag screw.  Patient is a 52 y.o. male presenting with ankle pain.  Ankle Pain Associated symptoms: no fever and no neck pain     Past Medical History  Diagnosis Date  . Arthritis   . Asthma     Past Surgical History  Procedure Laterality Date  . Ankle surgery      No family history on file.  History  Substance Use Topics  . Smoking status: Current Every Day Smoker -- 1.00 packs/day    Types: Cigarettes  . Smokeless tobacco: Not on file     Comment: 1 ppd  . Alcohol Use: 8.4 oz/week    14 Cans of beer per week     Comment: occasionally; generally drinks (2) 40s/day       Review of Systems  Constitutional: Negative for fever, diaphoresis and activity change.  HENT: Negative for congestion and neck pain.   Respiratory: Negative for cough.   Genitourinary: Negative for dysuria.  Musculoskeletal: Negative for myalgias.  Skin: Negative for color change and wound.  Neurological: Negative for headaches.  All other systems reviewed and are negative.    Allergies  Review of patient's allergies indicates no known allergies.  Home Medications   Current Outpatient Rx  Name  Route  Sig  Dispense  Refill  . aspirin 81 MG chewable tablet   Oral   Chew 81 mg by mouth daily. For arthritis pain         . diphenhydrAMINE (BENADRYL) 25 MG tablet   Oral   Take 25 mg by mouth every 6 (six) hours as needed for itching.         Marland Kitchen EPINEPHrine (EPIPEN) 0.3 mg/0.3 mL DEVI   Intramuscular   Inject 0.3 mLs (0.3 mg total) into the muscle as needed.   2 Device   1   . famotidine (PEPCID) 20 MG tablet   Oral   Take 20 mg by mouth daily.         Marland Kitchen oxyCODONE-acetaminophen (PERCOCET/ROXICET) 5-325 MG per tablet   Oral   Take 1-2 tablets by mouth every 6 (six) hours as needed.   40 tablet   0   . predniSONE (DELTASONE) 50 MG tablet   Oral  Take 50 mg by mouth daily.           BP 137/71  Pulse 81  Temp(Src) 98.2 F (36.8 C) (Oral)  Resp 18  SpO2 95%  Physical Exam  Nursing note and vitals reviewed. Constitutional: He appears well-developed and well-nourished. No distress.  HENT:  Head: Normocephalic and atraumatic.  Eyes: Conjunctivae and EOM are normal.  Neck: Normal range of motion. Neck supple.  Cardiovascular:  Intact distal pulses, capillary refill < 3 seconds  Musculoskeletal:  LLE: TTP along tib/fib both distally and proximally. Nontender medial and lateral malleolus. Normal ankle adn digit ROM, All other extremities with normal ROM  Neurological:  No sensory deficit  Skin: He is not diaphoretic.  Skin intact. Tenting of the  LLE distally and anteriorly     ED Course  Procedures (including critical care time)  Labs Reviewed - No data to display Dg Tibia/fibula Left  01/21/2013  *RADIOLOGY REPORT*  Clinical Data: Ankle pain  LEFT TIBIA AND FIBULA - 2 VIEW  Comparison: Prior left ankle radiographs 02/16/2011  Findings: Acute comminuted fracture of the fibular head, and and acute minimally displaced spiral fracture of the distal tibial diaphysis.  The distal tibial fracture fragment is displaced laterally by approximately 6 mm with respect to the proximal fracture fragment.  Surgical changes of bilateral ORIF of a lateral malleolar fracture are noted.  No evidence of hardware complication or periprosthetic fracture.  There is bony ankylosis of the syndesmosis.  Well corticated ossified structures inferior to the medial and lateral malleolus suggest the sequela of a remote healed fracture.  There is associated soft tissue swelling.  Bony prominence along the medial femoral condyle suggests an underlying tug lesion  No knee joint effusion.  The proximal fracture fragment of the fibula is displaced posteriorly.  IMPRESSION:  1.  Comminuted fracture of the fibular head. 2.  Mildly displaced spiral fracture of the distal tibial diaphysis. 3.  Prior ORIF of a lateral malleolar fracture without evidence of hardware complication or periprosthetic fracture.   Original Report Authenticated By: Malachy Moan, M.D.    Dg Ankle Complete Left  01/21/2013  *RADIOLOGY REPORT*  Clinical Data: Ankle pain  LEFT ANKLE COMPLETE - 3+ VIEW  Comparison: Concurrently obtained radiographs of the left lower leg; prior radiographs of the left ankle 02/16/2011  Findings: Redemonstration of minimally displaced spiral fracture of the distal tibial diaphysis.  The distal fracture fragment is displaced laterally by 6 mm in posteriorly by 11 mm with respect to the proximal fracture fragment.  Surgical changes of prior ORIF of a now healed lateral malleolar  fracture with osseous bridging of the syndesmosis.  Well corticated ossicle inferior to the medial and lateral malleoli suggest healed remote the avulsion fractures. There is associated soft tissue swelling and deformity about the distal tibial fracture.  IMPRESSION: Displaced spiral fracture of the distal tibial diaphysis.  ORIF of a healed lateral malleolar fracture without evidence of hardware complication or periprosthetic fracture.  There is bony ankylosis of the adjacent syndesmosis.   Original Report Authenticated By: Malachy Moan, M.D.      No diagnosis found.  Consult orthopedics, Sports medicine: I spoke with Dr. Thurston Hole will send his physician assistant to the emergency department to splint the patient will be admitted for surgery tomorrow morning.    MDM  Comminuted fracture of the fibular head Mildly a fractured distal tibia diaphysis  Patient is a 52 year old with history of arthritis and previous left distal fib ORIF repaired by  Dr. Dion Saucier back in 2012 that presents today after a fall.  Patient is neurovascularly intact on physical exam.  Skin tenting is present, however fracture is closed.  Consult orthopedics as above.  Patient appears recently stabilized for admission.  Admitting labs pending.  Note that there is alcohol on board at time of incident.  Patient is not currently on blood thinners.The patient appears reasonably stabilized for admission considering the current resources, flow, and capabilities available in the ED at this time, and I doubt any other [facility] requiring further screening and/or treatment in the ED prior to admission. Spoke with attending who is agreeable with plan.        Jaci Carrel, PA-C 01/21/13 2119  Jaci Carrel, PA-C 01/21/13 2154

## 2013-01-21 NOTE — H&P (Signed)
Referring Physician: ED Physician  Frederick [name] is an 52 y.o. male.  HPI: 52 year old black male slipped on loose gravel in his driveway.  Injuring his left leg.  No loss of consciousness.  His son witness the fall.  Came to ER via EMS.  Past Medical History  Diagnosis Date  . Arthritis   . Asthma   . EtOH dependence     drinks 2-3 40s a day  . Fibula fracture     Dr Dion Saucier 02-2011  . Hernia     surgery 01-17-13    Past Surgical History  Procedure Laterality Date  . Ankle surgery      No family history on file.  Social History:  reports that he has been smoking Cigarettes.  He has been smoking about 1.00 pack per day. He does not have any smokeless tobacco history on file. He reports that he drinks about 8.4 ounces of alcohol per week. He reports that he does not use illicit drugs.  Allergies: No Known Allergies  Medications: I have reviewed the patient's current medications. No current facility-administered medications on file prior to encounter.   Current Outpatient Prescriptions on File Prior to Encounter  Medication Sig Dispense Refill  . aspirin 81 MG chewable tablet Chew 81 mg by mouth daily. For arthritis pain      . diphenhydrAMINE (BENADRYL) 25 MG tablet Take 25 mg by mouth every 6 (six) hours as needed for itching.      Marland Kitchen EPINEPHrine (EPIPEN) 0.3 mg/0.3 mL DEVI Inject 0.3 mLs (0.3 mg total) into the muscle as needed.  2 Device  1  . famotidine (PEPCID) 20 MG tablet Take 20 mg by mouth daily.      Marland Kitchen oxyCODONE-acetaminophen (PERCOCET/ROXICET) 5-325 MG per tablet Take 1-2 tablets by mouth every 6 (six) hours as needed.  40 tablet  0  . predniSONE (DELTASONE) 50 MG tablet Take 50 mg by mouth daily.       Results for orders placed during the hospital encounter of 01/21/13 (from the past 48 hour(s))  CBC     Status: Abnormal   Collection Time    01/21/13  9:25 PM      Result Value Range   WBC 7.2  4.0 - 10.5 K/uL   RBC 4.11 (*) 4.22 - 5.81 MIL/uL   Hemoglobin 13.2   13.0 - 17.0 g/dL   HCT 46.9  62.9 - 52.8 %   MCV 95.1  78.0 - 100.0 fL   MCH 32.1  26.0 - 34.0 pg   MCHC 33.8  30.0 - 36.0 g/dL   RDW 41.3  24.4 - 01.0 %   Platelets 153  150 - 400 K/uL  BASIC METABOLIC PANEL     Status: Abnormal   Collection Time    01/21/13  9:25 PM      Result Value Range   Sodium 134 (*) 135 - 145 mEq/L   Potassium 3.5  3.5 - 5.1 mEq/L   Chloride 96  96 - 112 mEq/L   CO2 27  19 - 32 mEq/L   Glucose, Bld 95  70 - 99 mg/dL   BUN 11  6 - 23 mg/dL   Creatinine, Ser 2.72  0.50 - 1.35 mg/dL   Calcium 8.8  8.4 - 53.6 mg/dL   GFR calc non Af Amer >90  >90 mL/min   GFR calc Af Amer >90  >90 mL/min   Comment:  The eGFR has been calculated     using the CKD EPI equation.     This calculation has not been     validated in all clinical     situations.     eGFR's persistently     <90 mL/min signify     possible Chronic Kidney Disease.    Dg Tibia/fibula Left  01/21/2013  *RADIOLOGY REPORT*  Clinical Data: Ankle pain  LEFT TIBIA AND FIBULA - 2 VIEW  Comparison: Prior left ankle radiographs 02/16/2011  Findings: Acute comminuted fracture of the fibular head, and and acute minimally displaced spiral fracture of the distal tibial diaphysis.  The distal tibial fracture fragment is displaced laterally by approximately 6 mm with respect to the proximal fracture fragment.  Surgical changes of bilateral ORIF of a lateral malleolar fracture are noted.  No evidence of hardware complication or periprosthetic fracture.  There is bony ankylosis of the syndesmosis.  Well corticated ossified structures inferior to the medial and lateral malleolus suggest the sequela of a remote healed fracture.  There is associated soft tissue swelling.  Bony prominence along the medial femoral condyle suggests an underlying tug lesion  No knee joint effusion.  The proximal fracture fragment of the fibula is displaced posteriorly.  IMPRESSION:  1.  Comminuted fracture of the fibular head. 2.   Mildly displaced spiral fracture of the distal tibial diaphysis. 3.  Prior ORIF of a lateral malleolar fracture without evidence of hardware complication or periprosthetic fracture.   Original Report Authenticated By: Malachy Moan, M.D.    Dg Ankle Complete Left  01/21/2013  *RADIOLOGY REPORT*  Clinical Data: Ankle pain  LEFT ANKLE COMPLETE - 3+ VIEW  Comparison: Concurrently obtained radiographs of the left lower leg; prior radiographs of the left ankle 02/16/2011  Findings: Redemonstration of minimally displaced spiral fracture of the distal tibial diaphysis.  The distal fracture fragment is displaced laterally by 6 mm in posteriorly by 11 mm with respect to the proximal fracture fragment.  Surgical changes of prior ORIF of a now healed lateral malleolar fracture with osseous bridging of the syndesmosis.  Well corticated ossicle inferior to the medial and lateral malleoli suggest healed remote the avulsion fractures. There is associated soft tissue swelling and deformity about the distal tibial fracture.  IMPRESSION: Displaced spiral fracture of the distal tibial diaphysis.  ORIF of a healed lateral malleolar fracture without evidence of hardware complication or periprosthetic fracture.  There is bony ankylosis of the adjacent syndesmosis.   Original Report Authenticated By: Malachy Moan, M.D.     Review of Systems  Constitutional: Negative.   HENT: Negative.   Eyes: Negative.   Respiratory: Negative.   Cardiovascular: Negative.   Gastrointestinal: Positive for constipation.  Genitourinary: Negative.        Had urinary retentions last week after surgery  Musculoskeletal: Positive for joint pain.       Knee pain arthritis, ankle pain  Skin: Negative.   Neurological: Negative.   Endo/Heme/Allergies: Negative.   Psychiatric/Behavioral: Negative.    Blood pressure 137/71, pulse 81, temperature 98.2 F (36.8 C), temperature source Oral, resp. rate 18, SpO2 95.00%. Physical Exam   Constitutional: He is oriented to person, place, and time. He appears well-developed and well-nourished.  HENT:  Head: Normocephalic and atraumatic.  Eyes: EOM are normal. Pupils are equal, round, and reactive to light.  Neck: Neck supple.  Cardiovascular: Normal rate and regular rhythm.   Respiratory: Effort normal.  GI: Soft.  Genitourinary:  Not pertinent to current  symptomatology therefore not examined.  Musculoskeletal:  Left foot externally rotated,  2+ DP pulse  Neurological: He is alert and oriented to person, place, and time.  Skin: Skin is warm and dry.  Psychiatric: He has a normal mood and affect. His behavior is normal.    Assessment Principal Problem:   Closed fracture of left distal distal tibia Active Problems:   EtOH dependence   Hernia   Fracture of fibula, proximal   Plan: Admit Patient, splint, ETOH withdrawal protocol  Surgery likely 2/17.   NPO after midnight  SHEPPERSON,KIRSTIN J 01/21/2013, 10:44 PM

## 2013-01-21 NOTE — Consult Note (Addendum)
Reason for Consult:tib/fib fracture Referring Physician: ED Physician  ZARIF RATHJE is an 52 y.o. male.  HPI: 52 year old black male slipped on loose gravel in his driveway.  Injuring his left leg.  No loss of consciousness.  His son witness the fall.  Came to ER via EMS.  Past Medical History  Diagnosis Date  . Arthritis   . Asthma   . EtOH dependence     drinks 2-3 40s a day  . Fibula fracture     Dr Dion Saucier 02-2011  . Hernia     surgery 01-17-13    Past Surgical History  Procedure Laterality Date  . Ankle surgery      No family history on file.  Social History:  reports that he has been smoking Cigarettes.  He has been smoking about 1.00 pack per day. He does not have any smokeless tobacco history on file. He reports that he drinks about 8.4 ounces of alcohol per week. He reports that he does not use illicit drugs.  Allergies: No Known Allergies  Medications: I have reviewed the patient's current medications. No current facility-administered medications on file prior to encounter.   Current Outpatient Prescriptions on File Prior to Encounter  Medication Sig Dispense Refill  . aspirin 81 MG chewable tablet Chew 81 mg by mouth daily. For arthritis pain      . diphenhydrAMINE (BENADRYL) 25 MG tablet Take 25 mg by mouth every 6 (six) hours as needed for itching.      Marland Kitchen EPINEPHrine (EPIPEN) 0.3 mg/0.3 mL DEVI Inject 0.3 mLs (0.3 mg total) into the muscle as needed.  2 Device  1  . famotidine (PEPCID) 20 MG tablet Take 20 mg by mouth daily.      Marland Kitchen oxyCODONE-acetaminophen (PERCOCET/ROXICET) 5-325 MG per tablet Take 1-2 tablets by mouth every 6 (six) hours as needed.  40 tablet  0  . predniSONE (DELTASONE) 50 MG tablet Take 50 mg by mouth daily.       Results for orders placed during the hospital encounter of 01/21/13 (from the past 48 hour(s))  CBC     Status: Abnormal   Collection Time    01/21/13  9:25 PM      Result Value Range   WBC 7.2  4.0 - 10.5 K/uL   RBC 4.11 (*)  4.22 - 5.81 MIL/uL   Hemoglobin 13.2  13.0 - 17.0 g/dL   HCT 16.1  09.6 - 04.5 %   MCV 95.1  78.0 - 100.0 fL   MCH 32.1  26.0 - 34.0 pg   MCHC 33.8  30.0 - 36.0 g/dL   RDW 40.9  81.1 - 91.4 %   Platelets 153  150 - 400 K/uL  BASIC METABOLIC PANEL     Status: Abnormal   Collection Time    01/21/13  9:25 PM      Result Value Range   Sodium 134 (*) 135 - 145 mEq/L   Potassium 3.5  3.5 - 5.1 mEq/L   Chloride 96  96 - 112 mEq/L   CO2 27  19 - 32 mEq/L   Glucose, Bld 95  70 - 99 mg/dL   BUN 11  6 - 23 mg/dL   Creatinine, Ser 7.82  0.50 - 1.35 mg/dL   Calcium 8.8  8.4 - 95.6 mg/dL   GFR calc non Af Amer >90  >90 mL/min   GFR calc Af Amer >90  >90 mL/min   Comment:  The eGFR has been calculated     using the CKD EPI equation.     This calculation has not been     validated in all clinical     situations.     eGFR's persistently     <90 mL/min signify     possible Chronic Kidney Disease.    Dg Tibia/fibula Left  01/21/2013  *RADIOLOGY REPORT*  Clinical Data: Ankle pain  LEFT TIBIA AND FIBULA - 2 VIEW  Comparison: Prior left ankle radiographs 02/16/2011  Findings: Acute comminuted fracture of the fibular head, and and acute minimally displaced spiral fracture of the distal tibial diaphysis.  The distal tibial fracture fragment is displaced laterally by approximately 6 mm with respect to the proximal fracture fragment.  Surgical changes of bilateral ORIF of a lateral malleolar fracture are noted.  No evidence of hardware complication or periprosthetic fracture.  There is bony ankylosis of the syndesmosis.  Well corticated ossified structures inferior to the medial and lateral malleolus suggest the sequela of a remote healed fracture.  There is associated soft tissue swelling.  Bony prominence along the medial femoral condyle suggests an underlying tug lesion  No knee joint effusion.  The proximal fracture fragment of the fibula is displaced posteriorly.  IMPRESSION:  1.  Comminuted  fracture of the fibular head. 2.  Mildly displaced spiral fracture of the distal tibial diaphysis. 3.  Prior ORIF of a lateral malleolar fracture without evidence of hardware complication or periprosthetic fracture.   Original Report Authenticated By: Malachy Moan, M.D.    Dg Ankle Complete Left  01/21/2013  *RADIOLOGY REPORT*  Clinical Data: Ankle pain  LEFT ANKLE COMPLETE - 3+ VIEW  Comparison: Concurrently obtained radiographs of the left lower leg; prior radiographs of the left ankle 02/16/2011  Findings: Redemonstration of minimally displaced spiral fracture of the distal tibial diaphysis.  The distal fracture fragment is displaced laterally by 6 mm in posteriorly by 11 mm with respect to the proximal fracture fragment.  Surgical changes of prior ORIF of a now healed lateral malleolar fracture with osseous bridging of the syndesmosis.  Well corticated ossicle inferior to the medial and lateral malleoli suggest healed remote the avulsion fractures. There is associated soft tissue swelling and deformity about the distal tibial fracture.  IMPRESSION: Displaced spiral fracture of the distal tibial diaphysis.  ORIF of a healed lateral malleolar fracture without evidence of hardware complication or periprosthetic fracture.  There is bony ankylosis of the adjacent syndesmosis.   Original Report Authenticated By: Malachy Moan, M.D.     Review of Systems  Constitutional: Negative.   HENT: Negative.   Eyes: Negative.   Respiratory: Negative.   Cardiovascular: Negative.   Gastrointestinal: Positive for constipation.  Genitourinary: Negative.        Had urinary retentions last week after surgery  Musculoskeletal: Positive for joint pain.       Knee pain arthritis, ankle pain  Skin: Negative.   Neurological: Negative.   Endo/Heme/Allergies: Negative.   Psychiatric/Behavioral: Negative.    Blood pressure 137/71, pulse 81, temperature 98.2 F (36.8 C), temperature source Oral, resp. rate 18,  SpO2 95.00%. Physical Exam  Constitutional: He is oriented to person, place, and time. He appears well-developed and well-nourished.  HENT:  Head: Normocephalic and atraumatic.  Eyes: EOM are normal. Pupils are equal, round, and reactive to light.  Neck: Neck supple.  Cardiovascular: Normal rate and regular rhythm.   Respiratory: Effort normal.  GI: Soft.  Genitourinary:  Not pertinent to current  symptomatology therefore not examined.  Musculoskeletal:  Left foot externally rotated,  2+ DP pulse  Neurological: He is alert and oriented to person, place, and time.  Skin: Skin is warm and dry.  Psychiatric: He has a normal mood and affect. His behavior is normal.    Assessment Principal Problem:   Closed fracture of left distal distal tibia Active Problems:   EtOH dependence   Hernia   Fracture of fibula, proximal   Plan: Admit Patient, splint, ETOH withdrawal protocol  Surgery likely 2/17   NPO after midnight  SHEPPERSON,KIRSTIN J 01/21/2013, 10:44 PM

## 2013-01-22 ENCOUNTER — Encounter (HOSPITAL_COMMUNITY): Payer: Self-pay | Admitting: Certified Registered Nurse Anesthetist

## 2013-01-22 ENCOUNTER — Encounter (HOSPITAL_COMMUNITY)
Admission: EM | Disposition: A | Discharge status: Home / Self Care | Payer: Self-pay | Source: Home / Self Care | Attending: Orthopedic Surgery

## 2013-01-22 ENCOUNTER — Inpatient Hospital Stay (HOSPITAL_COMMUNITY): Payer: MEDICAID

## 2013-01-22 ENCOUNTER — Inpatient Hospital Stay (HOSPITAL_COMMUNITY): Payer: MEDICAID | Admitting: Certified Registered Nurse Anesthetist

## 2013-01-22 HISTORY — PX: TIBIA IM NAIL INSERTION: SHX2516

## 2013-01-22 LAB — COMPREHENSIVE METABOLIC PANEL
ALT: 22 U/L (ref 0–53)
Albumin: 2.9 g/dL — ABNORMAL LOW (ref 3.5–5.2)
Alkaline Phosphatase: 56 U/L (ref 39–117)
BUN: 10 mg/dL (ref 6–23)
Chloride: 102 mEq/L (ref 96–112)
Glucose, Bld: 105 mg/dL — ABNORMAL HIGH (ref 70–99)
Potassium: 3.7 mEq/L (ref 3.5–5.1)
Sodium: 141 mEq/L (ref 135–145)
Total Bilirubin: 0.2 mg/dL — ABNORMAL LOW (ref 0.3–1.2)

## 2013-01-22 LAB — CBC
HCT: 38 % — ABNORMAL LOW (ref 39.0–52.0)
Hemoglobin: 12.3 g/dL — ABNORMAL LOW (ref 13.0–17.0)
Hemoglobin: 13.2 g/dL (ref 13.0–17.0)
MCH: 32.1 pg (ref 26.0–34.0)
MCH: 32.9 pg (ref 26.0–34.0)
MCV: 94.3 fL (ref 78.0–100.0)
RBC: 3.83 MIL/uL — ABNORMAL LOW (ref 4.22–5.81)
RBC: 4.01 MIL/uL — ABNORMAL LOW (ref 4.22–5.81)
WBC: 6.4 10*3/uL (ref 4.0–10.5)

## 2013-01-22 LAB — CREATININE, SERUM
Creatinine, Ser: 0.75 mg/dL (ref 0.50–1.35)
GFR calc non Af Amer: >90 mL/min (ref >90)

## 2013-01-22 LAB — MRSA PCR SCREENING: MRSA by PCR: NEGATIVE

## 2013-01-22 LAB — PROTIME-INR: Prothrombin Time: 12.8 seconds (ref 11.6–15.2)

## 2013-01-22 SURGERY — INSERTION, INTRAMEDULLARY ROD, TIBIA
Anesthesia: General | Site: Leg Lower | Laterality: Left | Wound class: Clean

## 2013-01-22 MED ORDER — HYDROMORPHONE HCL PF 1 MG/ML IJ SOLN
INTRAMUSCULAR | Status: AC
  Filled 2013-01-22: qty 1

## 2013-01-22 MED ORDER — LACTATED RINGERS IV SOLN: INTRAVENOUS | Status: DC

## 2013-01-22 MED ORDER — METOCLOPRAMIDE HCL 5 MG/ML IJ SOLN: 5.0000 mg | Freq: Three times a day (TID) | INTRAMUSCULAR | Status: DC | PRN

## 2013-01-22 MED ORDER — OXYCODONE HCL 5 MG PO TABS
5.0000 mg | ORAL_TABLET | Freq: Once | ORAL | Status: DC | PRN
Start: 2013-01-22 — End: 2013-01-22

## 2013-01-22 MED ORDER — ASPIRIN EC 325 MG PO TBEC: 325.0000 mg | DELAYED_RELEASE_TABLET | Freq: Every day | ORAL | Status: DC

## 2013-01-22 MED ORDER — ONDANSETRON HCL 4 MG/2ML IJ SOLN: 4.0000 mg | Freq: Four times a day (QID) | INTRAMUSCULAR | Status: DC | PRN

## 2013-01-22 MED ORDER — GLYCOPYRROLATE 0.2 MG/ML IJ SOLN
INTRAMUSCULAR | Status: DC | PRN
  Administered 2013-01-22: .6 mg via INTRAVENOUS

## 2013-01-22 MED ORDER — OXYCODONE-ACETAMINOPHEN 10-325 MG PO TABS: 1.0000 | ORAL_TABLET | Freq: Four times a day (QID) | ORAL | Status: DC | PRN

## 2013-01-22 MED ORDER — LACTATED RINGERS IV SOLN
INTRAVENOUS | Status: DC | PRN
  Administered 2013-01-22 (×2): via INTRAVENOUS

## 2013-01-22 MED ORDER — SORBITOL 70 % SOLN
30.0000 mL | Freq: Every day | Status: DC | PRN
  Administered 2013-01-23: 30 mL via ORAL
  Filled 2013-01-22: qty 30

## 2013-01-22 MED ORDER — ONDANSETRON HCL 4 MG/2ML IJ SOLN
INTRAMUSCULAR | Status: DC | PRN
  Administered 2013-01-22: 4 mg via INTRAVENOUS

## 2013-01-22 MED ORDER — ARTIFICIAL TEARS OP OINT
TOPICAL_OINTMENT | OPHTHALMIC | Status: DC | PRN
  Administered 2013-01-22: 1 via OPHTHALMIC

## 2013-01-22 MED ORDER — PROMETHAZINE HCL 25 MG PO TABS: 25.0000 mg | ORAL_TABLET | Freq: Four times a day (QID) | ORAL | Status: DC | PRN

## 2013-01-22 MED ORDER — SENNA 8.6 MG PO TABS
1.0000 | ORAL_TABLET | Freq: Two times a day (BID) | ORAL | Status: DC
  Administered 2013-01-22 – 2013-01-25 (×6): 8.6 mg via ORAL
  Filled 2013-01-22 (×7): qty 1

## 2013-01-22 MED ORDER — METOCLOPRAMIDE HCL 10 MG PO TABS
5.0000 mg | ORAL_TABLET | Freq: Three times a day (TID) | ORAL | Status: DC | PRN
Start: 2013-01-22 — End: 2013-01-25

## 2013-01-22 MED ORDER — OXYCODONE HCL 5 MG/5ML PO SOLN: 5.0000 mg | Freq: Once | ORAL | Status: DC | PRN

## 2013-01-22 MED ORDER — OXYCODONE HCL 5 MG PO TABS
5.0000 mg | ORAL_TABLET | ORAL | Status: DC | PRN
  Administered 2013-01-22: 10 mg via ORAL
  Filled 2013-01-22: qty 2

## 2013-01-22 MED ORDER — FENTANYL CITRATE 0.05 MG/ML IJ SOLN
INTRAMUSCULAR | Status: DC | PRN
  Administered 2013-01-22 (×4): 50 ug via INTRAVENOUS
  Administered 2013-01-22: 100 ug via INTRAVENOUS
  Administered 2013-01-22 (×3): 50 ug via INTRAVENOUS
  Administered 2013-01-22: 150 ug via INTRAVENOUS

## 2013-01-22 MED ORDER — METHOCARBAMOL 500 MG PO TABS: 500.0000 mg | ORAL_TABLET | Freq: Four times a day (QID) | ORAL | Status: DC

## 2013-01-22 MED ORDER — FAMOTIDINE 20 MG PO TABS
20.0000 mg | ORAL_TABLET | Freq: Every day | ORAL | Status: DC
  Administered 2013-01-22: 20 mg via ORAL
  Filled 2013-01-22: qty 1

## 2013-01-22 MED ORDER — METHOCARBAMOL 500 MG PO TABS
500.0000 mg | ORAL_TABLET | Freq: Four times a day (QID) | ORAL | Status: DC | PRN
  Administered 2013-01-23: 500 mg via ORAL
  Filled 2013-01-22: qty 1

## 2013-01-22 MED ORDER — MIDAZOLAM HCL 5 MG/5ML IJ SOLN
INTRAMUSCULAR | Status: DC | PRN
  Administered 2013-01-22: 2 mg via INTRAVENOUS

## 2013-01-22 MED ORDER — OXYCODONE-ACETAMINOPHEN 5-325 MG PO TABS
1.0000 | ORAL_TABLET | ORAL | Status: DC | PRN
  Administered 2013-01-22 – 2013-01-25 (×8): 2 via ORAL
  Filled 2013-01-22 (×8): qty 2

## 2013-01-22 MED ORDER — PROPOFOL 10 MG/ML IV BOLUS
INTRAVENOUS | Status: DC | PRN
  Administered 2013-01-22: 200 mg via INTRAVENOUS

## 2013-01-22 MED ORDER — ZOLPIDEM TARTRATE 5 MG PO TABS: 5.0000 mg | ORAL_TABLET | Freq: Every evening | ORAL | Status: DC | PRN

## 2013-01-22 MED ORDER — HYDROMORPHONE HCL PF 1 MG/ML IJ SOLN
0.5000 mg | INTRAMUSCULAR | Status: DC | PRN
  Administered 2013-01-23: 1 mg via INTRAVENOUS
  Filled 2013-01-22: qty 1

## 2013-01-22 MED ORDER — OXYCODONE HCL 5 MG PO TABS: 5.0000 mg | ORAL_TABLET | ORAL | Status: DC | PRN

## 2013-01-22 MED ORDER — POLYETHYLENE GLYCOL 3350 17 G PO PACK
17.0000 g | PACK | Freq: Every day | ORAL | Status: DC | PRN
  Administered 2013-01-23 – 2013-01-25 (×2): 17 g via ORAL
  Filled 2013-01-22 (×2): qty 1

## 2013-01-22 MED ORDER — 0.9 % SODIUM CHLORIDE (POUR BTL) OPTIME
TOPICAL | Status: DC | PRN
  Administered 2013-01-22: 1000 mL

## 2013-01-22 MED ORDER — ONDANSETRON HCL 4 MG/2ML IJ SOLN: 4.0000 mg | Freq: Once | INTRAMUSCULAR | Status: DC | PRN

## 2013-01-22 MED ORDER — ENOXAPARIN SODIUM 30 MG/0.3ML ~~LOC~~ SOLN
30.0000 mg | SUBCUTANEOUS | Status: DC
  Administered 2013-01-23 – 2013-01-25 (×3): 30 mg via SUBCUTANEOUS
  Filled 2013-01-22 (×5): qty 0.3

## 2013-01-22 MED ORDER — PNEUMOCOCCAL VAC POLYVALENT 25 MCG/0.5ML IJ INJ
0.5000 mL | INJECTION | INTRAMUSCULAR | Status: AC
  Filled 2013-01-22: qty 0.5

## 2013-01-22 MED ORDER — MEPERIDINE HCL 25 MG/ML IJ SOLN: 6.2500 mg | INTRAMUSCULAR | Status: DC | PRN

## 2013-01-22 MED ORDER — POTASSIUM CHLORIDE IN NACL 20-0.9 MEQ/L-% IV SOLN
INTRAVENOUS | Status: DC
  Administered 2013-01-22: 02:00:00 via INTRAVENOUS
  Filled 2013-01-22 (×5): qty 1000

## 2013-01-22 MED ORDER — POTASSIUM CHLORIDE IN NACL 20-0.45 MEQ/L-% IV SOLN
INTRAVENOUS | Status: DC
  Administered 2013-01-22 – 2013-01-23 (×2): via INTRAVENOUS
  Filled 2013-01-22 (×7): qty 1000

## 2013-01-22 MED ORDER — LIDOCAINE HCL (CARDIAC) 20 MG/ML IV SOLN
INTRAVENOUS | Status: DC | PRN
  Administered 2013-01-22: 100 mg via INTRAVENOUS

## 2013-01-22 MED ORDER — NEOSTIGMINE METHYLSULFATE 1 MG/ML IJ SOLN
INTRAMUSCULAR | Status: DC | PRN
  Administered 2013-01-22: 4 mg via INTRAVENOUS

## 2013-01-22 MED ORDER — DEXTROSE 5 % IV SOLN: 500.0000 mg | Freq: Four times a day (QID) | INTRAVENOUS | Status: DC | PRN

## 2013-01-22 MED ORDER — DOCUSATE SODIUM 100 MG PO CAPS
100.0000 mg | ORAL_CAPSULE | Freq: Two times a day (BID) | ORAL | Status: DC
Start: 2013-01-22 — End: 2013-01-25
  Administered 2013-01-22 – 2013-01-25 (×6): 100 mg via ORAL
  Filled 2013-01-22 (×6): qty 1

## 2013-01-22 MED ORDER — CEFAZOLIN SODIUM-DEXTROSE 2-3 GM-% IV SOLR
2.0000 g | INTRAVENOUS | Status: AC
  Administered 2013-01-22: 2 g via INTRAVENOUS
  Filled 2013-01-22: qty 50

## 2013-01-22 MED ORDER — DIPHENHYDRAMINE HCL 25 MG PO CAPS: 25.0000 mg | ORAL_CAPSULE | Freq: Four times a day (QID) | ORAL | Status: DC | PRN

## 2013-01-22 MED ORDER — ROCURONIUM BROMIDE 100 MG/10ML IV SOLN
INTRAVENOUS | Status: DC | PRN
  Administered 2013-01-22: 10 mg via INTRAVENOUS
  Administered 2013-01-22: 50 mg via INTRAVENOUS
  Administered 2013-01-22: 40 mg via INTRAVENOUS

## 2013-01-22 MED ORDER — DIPHENHYDRAMINE HCL 12.5 MG/5ML PO ELIX: 12.5000 mg | ORAL_SOLUTION | ORAL | Status: DC | PRN

## 2013-01-22 MED ORDER — MORPHINE SULFATE 2 MG/ML IJ SOLN
0.5000 mg | INTRAMUSCULAR | Status: DC | PRN
  Administered 2013-01-22: 0.5 mg via INTRAVENOUS
  Filled 2013-01-22: qty 1

## 2013-01-22 MED ORDER — CEFAZOLIN SODIUM-DEXTROSE 2-3 GM-% IV SOLR
2.0000 g | Freq: Four times a day (QID) | INTRAVENOUS | Status: AC
  Administered 2013-01-22 – 2013-01-23 (×3): 2 g via INTRAVENOUS
  Filled 2013-01-22 (×3): qty 50

## 2013-01-22 MED ORDER — ONDANSETRON HCL 4 MG PO TABS: 4.0000 mg | ORAL_TABLET | Freq: Four times a day (QID) | ORAL | Status: DC | PRN

## 2013-01-22 MED ORDER — HYDROMORPHONE HCL PF 1 MG/ML IJ SOLN
0.2500 mg | INTRAMUSCULAR | Status: DC | PRN
  Administered 2013-01-22 (×2): 0.5 mg via INTRAVENOUS

## 2013-01-22 SURGICAL SUPPLY — 66 items
APL SKNCLS STERI-STRIP NONHPOA (GAUZE/BANDAGES/DRESSINGS) ×1
BANDAGE ELASTIC 4 VELCRO ST LF (GAUZE/BANDAGES/DRESSINGS) ×2 IMPLANT
BANDAGE ELASTIC 6 VELCRO ST LF (GAUZE/BANDAGES/DRESSINGS) ×2 IMPLANT
BANDAGE ESMARK 6X9 LF (GAUZE/BANDAGES/DRESSINGS) IMPLANT
BANDAGE GAUZE ELAST BULKY 4 IN (GAUZE/BANDAGES/DRESSINGS) ×2 IMPLANT
BENZOIN TINCTURE PRP APPL 2/3 (GAUZE/BANDAGES/DRESSINGS) ×2 IMPLANT
BIT DRILL 3.8X6 NS (BIT) ×2 IMPLANT
BIT DRILL 4.4 NS (BIT) ×1 IMPLANT
BLADE SURG 15 STRL LF DISP TIS (BLADE) ×1 IMPLANT
BLADE SURG 15 STRL SS (BLADE) ×2
BLADE SURG ROTATE 9660 (MISCELLANEOUS) IMPLANT
BNDG CMPR 9X6 STRL LF SNTH (GAUZE/BANDAGES/DRESSINGS)
BNDG COHESIVE 6X5 TAN STRL LF (GAUZE/BANDAGES/DRESSINGS) ×2 IMPLANT
BNDG ESMARK 6X9 LF (GAUZE/BANDAGES/DRESSINGS)
BOOTCOVER CLEANROOM LRG (PROTECTIVE WEAR) ×4 IMPLANT
CLOTH BEACON ORANGE TIMEOUT ST (SAFETY) ×2 IMPLANT
CLSR STERI-STRIP ANTIMIC 1/2X4 (GAUZE/BANDAGES/DRESSINGS) ×1 IMPLANT
COVER SURGICAL LIGHT HANDLE (MISCELLANEOUS) ×4 IMPLANT
CUFF TOURNIQUET SINGLE 34IN LL (TOURNIQUET CUFF) IMPLANT
CUFF TOURNIQUET SINGLE 44IN (TOURNIQUET CUFF) IMPLANT
DRAPE C-ARM 42X72 X-RAY (DRAPES) ×2 IMPLANT
DRAPE ORTHO SPLIT 77X108 STRL (DRAPES) ×4
DRAPE PROXIMA HALF (DRAPES) ×4 IMPLANT
DRAPE SURG ORHT 6 SPLT 77X108 (DRAPES) ×2 IMPLANT
DRAPE U-SHAPE 47X51 STRL (DRAPES) ×2 IMPLANT
DRSG ADAPTIC 3X8 NADH LF (GAUZE/BANDAGES/DRESSINGS) ×2 IMPLANT
DURAPREP 26ML APPLICATOR (WOUND CARE) ×2 IMPLANT
ELECT REM PT RETURN 9FT ADLT (ELECTROSURGICAL) ×2
ELECTRODE REM PT RTRN 9FT ADLT (ELECTROSURGICAL) ×1 IMPLANT
FACESHIELD LNG OPTICON STERILE (SAFETY) IMPLANT
GLOVE BIOGEL PI ORTHO PRO SZ8 (GLOVE) ×1
GLOVE ORTHO TXT STRL SZ7.5 (GLOVE) ×2 IMPLANT
GLOVE PI ORTHO PRO STRL SZ8 (GLOVE) ×1 IMPLANT
GLOVE SURG ORTHO 8.0 STRL STRW (GLOVE) ×4 IMPLANT
GOWN STRL NON-REIN LRG LVL3 (GOWN DISPOSABLE) IMPLANT
GUIDEWIRE BALL NOSE 80CM (WIRE) ×2 IMPLANT
KIT BASIN OR (CUSTOM PROCEDURE TRAY) ×2 IMPLANT
KIT ROOM TURNOVER OR (KITS) ×2 IMPLANT
MANIFOLD NEPTUNE II (INSTRUMENTS) ×2 IMPLANT
NAIL TIBIAL 10MMX37.5CM (Nail) ×1 IMPLANT
NS IRRIG 1000ML POUR BTL (IV SOLUTION) ×2 IMPLANT
PACK GENERAL/GYN (CUSTOM PROCEDURE TRAY) ×2 IMPLANT
PAD ARMBOARD 7.5X6 YLW CONV (MISCELLANEOUS) ×4 IMPLANT
PAD CAST 4YDX4 CTTN HI CHSV (CAST SUPPLIES) IMPLANT
PADDING CAST COTTON 4X4 STRL (CAST SUPPLIES) ×2
PIN GUIDE ACE (PIN) ×1 IMPLANT
SCREW ACECAP 42MM (Screw) ×1 IMPLANT
SCREW ACECAP 50MM (Screw) ×1 IMPLANT
SCREW PROXIMAL 4.5MMX18MM (Screw) ×1 IMPLANT
SCREW PROXIMAL DEPUY (Screw) ×2 IMPLANT
SCREW PRXML FT 60X5.5XNS LF (Screw) ×1 IMPLANT
SPLINT PLASTER CAST XFAST 5X30 (CAST SUPPLIES) IMPLANT
SPLINT PLASTER XFAST SET 5X30 (CAST SUPPLIES) ×1
SPONGE GAUZE 4X4 12PLY (GAUZE/BANDAGES/DRESSINGS) ×2 IMPLANT
STAPLER VISISTAT 35W (STAPLE) ×2 IMPLANT
STOCKINETTE IMPERVIOUS LG (DRAPES) ×2 IMPLANT
SUT MNCRL AB 4-0 PS2 18 (SUTURE) ×2 IMPLANT
SUT VIC AB 0 CT1 18XCR BRD 8 (SUTURE) ×1 IMPLANT
SUT VIC AB 0 CT1 8-18 (SUTURE) ×2
SUT VIC AB 2-0 CT1 27 (SUTURE) ×2
SUT VIC AB 2-0 CT1 TAPERPNT 27 (SUTURE) ×1 IMPLANT
SUT VIC AB 3-0 SH 8-18 (SUTURE) ×2 IMPLANT
TOWEL OR 17X24 6PK STRL BLUE (TOWEL DISPOSABLE) ×2 IMPLANT
TOWEL OR 17X26 10 PK STRL BLUE (TOWEL DISPOSABLE) ×2 IMPLANT
TRAY FOLEY CATH 14FR (SET/KITS/TRAYS/PACK) IMPLANT
WATER STERILE IRR 1000ML POUR (IV SOLUTION) ×2 IMPLANT

## 2013-01-22 NOTE — Transfer of Care (Signed)
Immediate Anesthesia Transfer of Care Note  Patient: Frederick [name]  Procedure(s) Performed: Procedure(s): INTRAMEDULLARY (IM) NAIL TIBIAL (Left)  Patient Location: PACU  Anesthesia Type:General  Level of Consciousness: awake, alert , oriented and patient cooperative  Airway & Oxygen Therapy: Patient Spontanous Breathing and Patient connected to nasal cannula oxygen  Post-op Assessment: Report given to PACU RN, Post -op Vital signs reviewed and stable and Patient moving all extremities X 4  Post vital signs: Reviewed and stable  Complications: No apparent anesthesia complications

## 2013-01-22 NOTE — Discharge Summary (Signed)
Frederick Cogliano M. Lizbett Garciagarcia, MD, FACS General, Bariatric, & Minimally Invasive Surgery [facility]  

## 2013-01-22 NOTE — Op Note (Signed)
01/21/2013 - 01/22/2013  6:46 PM  PATIENT:  Frederick [name]    PRE-OPERATIVE DIAGNOSIS:  left distal tibia fracture  POST-OPERATIVE DIAGNOSIS:  Same  PROCEDURE:  INTRAMEDULLARY (IM) NAIL TIBIAL  SURGEON:  [name], MD  PHYSICIAN ASSISTANT: Janace Litten, OPA-C, present and scrubbed throughout the case, critical for completion in a timely fashion, and for retraction, instrumentation, and closure.  ANESTHESIA:   General  PREOPERATIVE INDICATIONS:  Frederick [name] is a  52 y.o. male with a diagnosis of left distal tibia fracture who elected for surgical management.  We did discuss the option of nonsurgical management, however he indicated that he wanted to be able to put weight on his leg as soon as possible, and given his history of alcoholism, noncompliance with weightbearing and instructions, I thought that the risks of nonsurgical management would be fairly high, particularly with malunion, and inability to hold a satisfactory reduction, ultimately ending up and an unsatisfactory outcome. Therefore I recommended surgical intervention, although still stressed to him the importance of minimizing his weightbearing, in order to allow for the initial healing.  The risks benefits and alternatives were discussed with the patient preoperatively including but not limited to the risks of infection, bleeding, nerve injury, cardiopulmonary complications, the need for revision surgery, among others, and the patient was willing to proceed. We also discussed the risk for malunion, nonunion, hardware failure, need for hardware removal, among others.  OPERATIVE IMPLANTS: Biomet versa nail size 10 x 375 mm with 2 proximal interlocking bolts and 2 distal interlocking bolts.  OPERATIVE FINDINGS: Spiral oblique unstable tibia fracture.  OPERATIVE PROCEDURE: The patient is brought to the operating room and placed in the supine position. General anesthesia was administered. IV antibiotics were given. The left  lower extremity was prepped and draped in usual sterile fashion. The leg was elevated and exsanguinated and the tourniquet was inflated. Time out was performed. Anterior patellar tendon splitting approach carried out, and a guidewire was introduced. This was confirmed on AP and lateral views. I then reamed over the guidewire taking care to protect the patellar tendon.  I then reduced the fracture, and tried using a percutaneous tenaculum to assist with fracture reduction, although this was not really that helpful so this was abandoned. Instead I just manually held traction, and slight valgus, while Janace Litten, orthopedic PA-C reamed the tibia. Once we had prepared the tibia, 1.5 mm larger than the nail size, and had appropriate chatter, I placed the nail. The nail seated fully, and in fact slightly counter recessed. I couldn't place the deeper, with a little bit more room distal and the tibia, but I did not want to bury the nail too far down.  I confirmed fracture reduction on AP and lateral views, and overall appropriate length, and then secured the proximal tibia with oblique screws, and then placed 2 distal interlocking bolts. He had some synostosis from his open reduction internal fixation at the level of the distal fibula, and I suspect that the distalmost screw engaged in this bone. The screw looks slightly long on the x-ray compared to the tibia, however I certainly drilled exactly through this segment of bone, and there was some aberrant hypertrophic bone, which probably augmented my fixation.  I irrigated the wounds copiously, repaired the patellar tendon split and the subcutaneous tissue with Vicryl followed by routine closure for the skin. Steri-Strips and sterile gauze and a posterior splint was applied. He was awakened and the tourniquet released. Total tourniquet time was approximately  1 hour 15 minutes. He tolerated the procedure well and there were no complications.

## 2013-01-22 NOTE — Care Management (Signed)
CARE MANAGE MENT UTILIZATION REVIEW NOTE 01/22/2013     Patient:  [facility]   Account Number:  [id number]  Documented by:  Roxy Manns Mohini Heathcock   Per Ur Regulation Reviewed for med. necessity/level of care/duration of stay

## 2013-01-22 NOTE — Anesthesia Postprocedure Evaluation (Signed)
  Anesthesia Post-op Note  Patient: Frederick [name]  Procedure(s) Performed: Procedure(s): HERNIA REPAIR INGUINAL ADULT (Right)  Patient Location: Nursing Unit  Anesthesia Type:General  Level of Consciousness: awake, alert , oriented and patient cooperative  Airway and Oxygen Therapy: Patient Spontanous Breathing  Post-op Pain: none  Post-op Assessment: Post-op Vital signs reviewed, Patient's Cardiovascular Status Stable, Respiratory Function Stable, Patent Airway, No signs of Nausea or vomiting, Adequate PO intake, Pain level controlled, No headache, No backache, No residual numbness and No residual motor weakness  Post-op Vital Signs: Reviewed and stable  Complications: No apparent anesthesia complications, was discharged but slipped and fell in driveway at home.  Scheduled for IM tibial nail surgery this evening.

## 2013-01-22 NOTE — Anesthesia Procedure Notes (Signed)
Procedure Name: Intubation Date/Time: 01/22/2013 5:15 PM Performed by: Angelica Pou Pre-anesthesia Checklist: Patient identified, Timeout performed, Emergency Drugs available, Suction available and Patient being monitored Patient Re-evaluated:Patient Re-evaluated prior to inductionOxygen Delivery Method: Circle system utilized Preoxygenation: Pre-oxygenation with 100% oxygen Intubation Type: IV induction Ventilation: Mask ventilation without difficulty and Oral airway inserted - appropriate to patient size Laryngoscope Size: Mac and 4 Grade View: Grade III Tube type: Oral Tube size: 7.5 mm Number of attempts: 1 Airway Equipment and Method: Stylet and Oral airway Placement Confirmation: ETT inserted through vocal cords under direct vision,  breath sounds checked- equal and bilateral and positive ETCO2 Secured at: 23 cm Tube secured with: Tape Dental Injury: Teeth and Oropharynx as per pre-operative assessment

## 2013-01-22 NOTE — Anesthesia Preprocedure Evaluation (Signed)
Anesthesia Evaluation  Patient identified by MRN, date of birth, ID band Patient awake    Reviewed: Allergy & Precautions, H&P , NPO status , Patient's Chart, lab work & pertinent test results  Airway Mallampati: I TM Distance: >3 FB Neck ROM: Full    Dental   Pulmonary asthma ,          Cardiovascular     Neuro/Psych    GI/Hepatic   Endo/Other    Renal/GU      Musculoskeletal   Abdominal   Peds  Hematology   Anesthesia Other Findings   Reproductive/Obstetrics                           Anesthesia Physical Anesthesia Plan  ASA: II  Anesthesia Plan: General   Post-op Pain Management:    Induction: Intravenous  Airway Management Planned: Oral ETT  Additional Equipment:   Intra-op Plan:   Post-operative Plan: Extubation in OR  Informed Consent:   Plan Discussed with: CRNA and Surgeon  Anesthesia Plan Comments:         Anesthesia Quick Evaluation

## 2013-01-22 NOTE — Anesthesia Postprocedure Evaluation (Signed)
  Anesthesia Post-op Note  Patient: Frederick [name]  Procedure(s) Performed: Procedure(s): INTRAMEDULLARY (IM) NAIL TIBIAL (Left)  Patient Location: PACU  Anesthesia Type:General  Level of Consciousness: awake, alert , oriented and patient cooperative  Airway and Oxygen Therapy: Patient Spontanous Breathing  Post-op Pain: mild  Post-op Assessment: Post-op Vital signs reviewed, Patient's Cardiovascular Status Stable, Respiratory Function Stable, Patent Airway, No signs of Nausea or vomiting and Pain level controlled  Post-op Vital Signs: stable  Complications: No apparent anesthesia complications

## 2013-01-22 NOTE — Progress Notes (Signed)
The risks benefits and alternatives were discussed with the patient including but not limited to the risks of nonoperative treatment, versus surgical intervention including infection, bleeding, nerve injury, malunion, nonunion, the need for revision surgery, hardware prominence, hardware failure, the need for hardware removal, blood clots, cardiopulmonary complications, morbidity, mortality, among others, and they were willing to proceed.    The patient has been re-examined, and the chart reviewed, and there have been no interval changes to the documented history and physical.    The risks, benefits, and alternatives have been discussed at length, and the patient is willing to proceed.    [name], MD

## 2013-01-22 NOTE — Preoperative (Signed)
Beta Blockers   Reason not to administer Beta Blockers:Not Applicable 

## 2013-01-23 MED ORDER — BISACODYL 10 MG RE SUPP
10.0000 mg | Freq: Every day | RECTAL | Status: DC | PRN
  Administered 2013-01-23: 10 mg via RECTAL
  Filled 2013-01-23: qty 1

## 2013-01-23 NOTE — Progress Notes (Signed)
Patient ID: Frederick [name], male   DOB: September 22, 1961, 52 y.o.   MRN: [id number]     Subjective:  Patient reports pain as mild.  He is sitting up in a chair and alert and follows all commands.  Objective:   VITALS:   Filed Vitals:   01/23/13 0100 01/23/13 0621 01/23/13 0730 01/23/13 0836  BP: 145/76 172/90 160/84 140/87  Pulse: 98 100 73 69  Temp: 98.4 F (36.9 C) 98.5 F (36.9 C) 97.5 F (36.4 C)   TempSrc: Oral Oral Oral   Resp: 16 18 18    Height:      Weight:      SpO2: 98% 98% 98% 100%    ABD soft Sensation intact distally Dorsiflexion/Plantar flexion intact Incision: dressing C/D/I and no drainage Posterior splint in place   Lab Results  Component Value Date   WBC 7.4 01/22/2013   HGB 13.2 01/22/2013   HCT 38.0* 01/22/2013   MCV 94.8 01/22/2013   PLT 153 01/22/2013     Assessment/Plan: 1 Day Post-Op   Principal Problem:   Closed fracture of left distal distal tibia Active Problems:   EtOH dependence   Hernia   Fracture of fibula, proximal   Advance diet Up with therapy TTWB left lower ext. No signs of alcohol withdrawal Possible discharge home tomorrow.   Haskel Khan 01/23/2013, 11:53 AM   Teryl Lucy, MD Cell [phone] Pager [phone]

## 2013-01-23 NOTE — Progress Notes (Signed)
Physical Therapy Evaluation Patient Details Name: Frederick [name] MRN: [id number] DOB: Jan 08, 1961 Today's Date: 01/23/2013 Time: 4098-1191 PT Time Calculation (min): 17 min  PT Assessment / Plan / Recommendation Clinical Impression  Pt demonstrate safety and modied independence with mobility.  No further acute PT needs.      PT Assessment  Patent does not need any further PT services    Follow Up Recommendations  No PT follow up    Does the patient have the potential to tolerate intense rehabilitation      Barriers to Discharge        Equipment Recommendations  Rolling walker with 5" wheels    Recommendations for Other Services     Frequency      Precautions / Restrictions Precautions Precautions: Fall;Other (comment) Restrictions Weight Bearing Restrictions: Yes LLE Weight Bearing: Touchdown weight bearing   Pertinent Vitals/Pain 5/10 pain in LLE.  Pt medicated prior to session.        Mobility  Bed Mobility Bed Mobility: Supine to Sit Supine to Sit: 7: Independent Transfers Transfers: Sit to Stand;Stand to Sit Sit to Stand: 6: Modified independent (Device/Increase time);With upper extremity assist Stand to Sit: 6: Modified independent (Device/Increase time);To bed;With upper extremity assist Details for Transfer Assistance: VC's for precautions, safety and sequencing, pt somewhat implusive at times during transfers and functional mobility. Ambulation/Gait Ambulation/Gait Assistance: 5: Supervision Ambulation Distance (Feet): 60 Feet Assistive device: Rolling walker Ambulation/Gait Assistance Details: pt able to maintain NWB on LLE.   Gait Pattern: Step-to pattern Stairs: No    Exercises     PT Diagnosis:    PT Problem List:   PT Treatment Interventions:     PT Goals Acute Rehab PT Goals PT Goal Formulation: With patient  Visit Information  Last PT Received On: 01/23/13 Assistance Needed: +1    Subjective Data      Prior Functioning  Home  Living Lives With: Other (Comment) Available Help at Discharge: Other (Comment) Type of Home: Homeless Prior Function Level of Independence: Independent Able to Take Stairs?: Yes Vocation: Unemployed Communication Communication: No difficulties Dominant Hand: Right    Cognition  Cognition Overall Cognitive Status: Appears within functional limits for tasks assessed/performed Arousal/Alertness: Awake/alert Orientation Level: Appears intact for tasks assessed Behavior During Session: Anxious    Extremity/Trunk Assessment Right Lower Extremity Assessment RLE ROM/Strength/Tone: Within functional levels   Balance Balance Balance Assessed: No  End of Session PT - End of Session Equipment Utilized During Treatment: Gait belt Activity Tolerance: Patient tolerated treatment well Patient left: in bed;with call bell/phone within reach Nurse Communication: Mobility status  GP     Frederick [name] 01/23/2013, 4:46 PM  Alyson Ki L. Elisa Sorlie DPT [phone]

## 2013-01-23 NOTE — Evaluation (Signed)
Occupational Therapy Evaluation Patient Details Name: Frederick [name] MRN: [id number] DOB: [birth year] Today's Date: 01/23/2013 Time: 0272-5366 OT Time Calculation (min): 26 min  OT Assessment / Plan / Recommendation Clinical Impression  Pt currently TTWB LLE s/p L tibial IM nailing after fracture of distal tibia with deficits in ADL's and self care. He reports that he is currently homeless and was "going from shelter to shelter", he also states that he was seperated from his wife, whom "died last night" RN and MD made aware, pt requesting social work consult to discuss discharge planning. Pt will benefit from acute OT due to functional limitations and possibly HHOT w/ 24hr A/supervision vs SNF rehab depending on pt living situation.    OT Assessment  Patient needs continued OT Services    Follow Up Recommendations  Home health OT;SNF;Supervision/Assistance - 24 hour;Other (comment) (Pt reports homeless, rec 24hr A/supervision vs HHOT/SNF)    Barriers to Discharge Other (comment) (Homeless per pt report, wife "died last night")    Equipment Recommendations  None recommended by OT    Recommendations for Other Services    Frequency  Min 3X/week    Precautions / Restrictions Precautions Precautions: Fall;Other (comment) (TTWB LLE) Restrictions Weight Bearing Restrictions: Yes LLE Weight Bearing: Touchdown weight bearing   Pertinent Vitals/Pain No c/o     ADL  Eating/Feeding: Performed;Modified independent Where Assessed - Eating/Feeding: Edge of bed Grooming: Performed;Wash/dry hands;Set up Where Assessed - Grooming: Unsupported sitting Upper Body Bathing: Simulated;Set up Where Assessed - Upper Body Bathing: Unsupported sitting;Supported sitting Lower Body Bathing: Simulated;Minimal assistance Where Assessed - Lower Body Bathing: Supported sit to stand;Unsupported sit to stand Upper Body Dressing: Simulated;Set up Where Assessed - Upper Body Dressing: Unsupported  sitting;Supported sitting Lower Body Dressing: Performed;Set up;Supervision/safety;Minimal assistance Where Assessed - Lower Body Dressing: Unsupported sit to stand Toilet Transfer: Simulated;Minimal assistance;Supervision/safety Toilet Transfer Method: Sit to stand Toilet Transfer Equipment: Comfort height toilet;Grab bars Toileting - Clothing Manipulation and Hygiene: Simulated;Minimal assistance Where Assessed - Engineer, mining and Hygiene: Sit on 3-in-1 or toilet Tub/Shower Transfer Method: Not assessed Transfers/Ambulation Related to ADLs: Pt verbalized anxiety re: functional mobility and ambulation and after VC's for RW use and TTWB LLE, pt stood w/ Min guard assist and used RW in room. Pt requires VC's for safety and sequencing and for precautions. ADL Comments: Pt currently TTWB LLE s/p L tibial IM nailing after fracture of distal tibia with deficits in ADL's and self care. He reports that he is currently homeless and was "going from shelter to shelter", he also states that he was seperated from his wife, whom "died last night" RN and MD made aware, pt requesting social work consult to discuss discharge planning. Pt will benefit from acute OT due to functional limitations and possibly HHOT vs SNF rehab depending on pt living situation.    OT Diagnosis: Generalized weakness;Acute pain  OT Problem List: Decreased activity tolerance;Impaired balance (sitting and/or standing);Decreased knowledge of use of DME or AE;Decreased knowledge of precautions;Pain OT Treatment Interventions: Self-care/ADL training;Energy conservation;DME and/or AE instruction;Balance training;Patient/family education;Therapeutic activities   OT Goals Acute Rehab OT Goals OT Goal Formulation: With patient Time For Goal Achievement: 02/06/13 Potential to Achieve Goals: Good ADL Goals Pt Will Perform Grooming: with modified independence;Sitting at sink;Sitting, chair;Sitting, edge of bed ADL Goal:  Grooming - Progress: Goal set today Pt Will Perform Upper Body Dressing: with set-up;with modified independence;Sitting, bed;Sitting, chair ADL Goal: Upper Body Dressing - Progress: Goal set today Pt Will Perform Lower Body Dressing: with  supervision;Sit to stand from chair;Sit to stand from bed;Unsupported;with adaptive equipment ADL Goal: Lower Body Dressing - Progress: Goal set today Pt Will Transfer to Toilet: with supervision ADL Goal: Toilet Transfer - Progress: Goal set today Pt Will Perform Toileting - Clothing Manipulation: with supervision ADL Goal: Toileting - Clothing Manipulation - Progress: Goal set today Pt Will Perform Toileting - Hygiene: with modified independence;with supervision;Leaning right and/or left on 3-in-1/toilet ADL Goal: Toileting - Hygiene - Progress: Goal set today Pt Will Perform Tub/Shower Transfer: Tub transfer;Shower transfer;with min assist;Ambulation;with DME;Shower seat with back;Transfer tub bench;Grab bars ADL Goal: Tub/Shower Transfer - Progress: Goal set today  Visit Information  Last OT Received On: 01/23/13 Assistance Needed: +1    Subjective Data  Subjective: "I just found out that my wife died last night at [facility] hospital" Patient Stated Goal: Get social work consult to assist with needs, homeless   Prior Functioning     Home Living Lives With: Other (Comment) (Was staying with wife (whom passed away last night)) Available Help at Discharge: Other (Comment) (Pt reports that he was staying from "shelter to shelter" ) Type of Home: Homeless Prior Function Level of Independence: Independent Able to Take Stairs?: Yes Vocation: Unemployed Comments: Pt states "It's been three years since I've worked and I haven't got disability" 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 Communication: No difficulties Dominant Hand: Right    Vision/Perception Vision - History Baseline Vision: Wears glasses only for reading ("I need  Glasses") Patient Visual Report: No change from baseline   Cognition  Cognition Overall Cognitive Status: Appears within functional limits for tasks assessed/performed Arousal/Alertness: Awake/alert Orientation Level: Appears intact for tasks assessed Behavior During Session: Anxious    Extremity/Trunk Assessment Right Upper Extremity Assessment RUE ROM/Strength/Tone: Within functional levels RUE Coordination: WFL - gross/fine motor Left Upper Extremity Assessment LUE ROM/Strength/Tone: Within functional levels LUE Coordination: WFL - gross/fine motor Trunk Assessment Trunk Assessment: Normal     Mobility Bed Mobility Bed Mobility: Left Sidelying to Sit Left Sidelying to Sit: 6: Modified independent (Device/Increase time) Transfers Transfers: Sit to Stand;Stand to Sit Sit to Stand: 4: Min guard Stand to Sit: 4: Min guard Details for Transfer Assistance: VC's for precautions, safety and sequencing, pt somewhat implusive at times during transfers and functional mobility.        Balance Balance Balance Assessed: Yes Static Sitting Balance Static Sitting - Balance Support: No upper extremity supported (RLE supported) Static Standing Balance Static Standing - Balance Support: No upper extremity supported;During functional activity (LLE TTWB, VC's for precautions and safety)   End of Session OT - End of Session Equipment Utilized During Treatment: Gait belt;Other (comment) (RW) Activity Tolerance: Patient tolerated treatment well Patient left: in chair;with call bell/phone within reach;with nursing in room Nurse Communication: Mobility status;Precautions;Weight bearing status;Other (comment) (Pt reports that his wife died last night & he's homeless)  GO     Alm Bustard 01/23/2013, 11:51 AM

## 2013-01-24 ENCOUNTER — Encounter (HOSPITAL_COMMUNITY): Payer: Self-pay | Admitting: General Surgery

## 2013-01-24 NOTE — Progress Notes (Signed)
Occupational Therapy Treatment Patient Details Name: Frederick [name] MRN: [id number] DOB: [birth year] Today's Date: 01/24/2013 Time: 1478-2956 OT Time Calculation (min): 14 min  OT Assessment / Plan / Recommendation Comments on Treatment Session Pt making progress and doing well. Pt should continue with OT services to maximize level of function and safety to return home    Follow Up Recommendations  Home health OT;SNF;Supervision/Assistance - 24 hour    Barriers to Discharge   Pt concerned about his home situation as he was separated from his wife who recently passed away    Equipment Recommendations  None recommended by OT    Recommendations for Other Services    Frequency Min 2X/week   Plan Discharge plan remains appropriate    Precautions / Restrictions Precautions Precautions: Fall Restrictions Weight Bearing Restrictions: Yes LLE Weight Bearing: Touchdown weight bearing   Pertinent Vitals/Pain     ADL  Grooming: Performed;Min guard Where Assessed - Grooming: Supported standing Upper Body Bathing: Simulated;Set up Lower Body Bathing: Simulated;Minimal assistance Where Assessed - Lower Body Bathing: Supported standing Upper Body Dressing: Performed;Set up Lower Body Dressing: Performed;Min guard;Set up Where Assessed - Lower Body Dressing: Other (comment);Unsupported sitting;Supported sit to stand (donning underwear and socks) Toilet Transfer: Performed;Min guard Toilet Transfer Method: Sit to Barista: Regular height toilet Toileting - Clothing Manipulation and Hygiene: Performed;Min guard Where Assessed - Glass blower/designer Manipulation and Hygiene: Sit on 3-in-1 or toilet;Standing    OT Diagnosis:    OT Problem List:   OT Treatment Interventions:     OT Goals ADL Goals ADL Goal: Grooming - Progress: Progressing toward goals ADL Goal: Upper Body Dressing - Progress: Progressing toward goals ADL Goal: Lower Body Dressing - Progress:  Progressing toward goals ADL Goal: Toilet Transfer - Progress: Progressing toward goals ADL Goal: Toileting - Clothing Manipulation - Progress: Progressing toward goals  Visit Information  Last OT Received On: 01/24/13    Subjective Data  Subjective: " I need to get my home situation straightened out " Patient Stated Goal: To return home   Prior Functioning       Cognition  Cognition Overall Cognitive Status: Appears within functional limits for tasks assessed/performed Arousal/Alertness: Awake/alert Orientation Level: Appears intact for tasks assessed Behavior During Session: [facility] for tasks performed    Mobility  Bed Mobility Bed Mobility: Supine to Sit;Sitting - Scoot to Edge of Bed;Sit to Supine Left Sidelying to Sit: 6: Modified independent (Device/Increase time) Supine to Sit: 7: Independent Sitting - Scoot to Edge of Bed: 7: Independent Sit to Supine: 6: Modified independent (Device/Increase time) Transfers Transfers: Sit to Stand;Stand to Sit Sit to Stand: 6: Modified independent (Device/Increase time);Without upper extremity assist Stand to Sit: 6: Modified independent (Device/Increase time);Without upper extremity assist    Exercises      Balance Balance Balance Assessed: No   End of Session OT - End of Session Equipment Utilized During Treatment: Gait belt (RW) Activity Tolerance: Patient tolerated treatment well Patient left: in bed;with call bell/phone within reach  GO     [name] 01/24/2013, 2:50 PM

## 2013-01-24 NOTE — Progress Notes (Signed)
     Subjective:  Patient reports pain as moderate.  Doesn't feel ready to go home yet.  Objective:   VITALS:   Filed Vitals:   01/23/13 2035 01/23/13 2100 01/24/13 0543 01/24/13 0800  BP: 133/84 133/84 149/83 135/87  Pulse: 97 97 95 94  Temp: 98.5 F (36.9 C)  98.5 F (36.9 C) 98.3 F (36.8 C)  TempSrc: Oral  Oral Oral  Resp: 16  16 15   Height:      Weight:      SpO2: 99%  100% 100%    Neurologically intact Dorsiflexion/Plantar flexion intact   Lab Results  Component Value Date   WBC 7.4 01/22/2013   HGB 13.2 01/22/2013   HCT 38.0* 01/22/2013   MCV 94.8 01/22/2013   PLT 153 01/22/2013     Assessment/Plan: 2 Days Post-Op   Principal Problem:   Closed fracture of left distal distal tibia Active Problems:   EtOH dependence   Hernia   Fracture of fibula, proximal   Advance diet Up with therapy Plan for discharge tomorrow TTWB LLE, cast shoe over splint to protect splint   Eissa Buchberger P 01/24/2013, 8:41 AM   Teryl Lucy, MD Cell [phone] Pager [phone]

## 2013-01-24 NOTE — Progress Notes (Signed)
Orthopedic Tech Progress Note Patient Details:  Frederick [name] 04-08-1961 [id number]  Ortho Devices Type of Ortho Device: Postop shoe/boot Splint Material: Plaster Ortho Device/Splint Location: LEFT POST PO SHOE Ortho Device/Splint Interventions: Application   Cammer, Mickie Bail 01/24/2013, 10:03 AM

## 2013-01-25 NOTE — Progress Notes (Signed)
Occupational Therapy Treatment Patient Details Name: Frederick [name] MRN: [id number] DOB: [birth year] Today's Date: 01/25/2013 Time: 3086-5784 OT Time Calculation (min): 24 min  OT Assessment / Plan / Recommendation Comments on Treatment Session Pt conitunes to make progress and is doing well. Pt scheduled to d/c to a friend's home this afternoon. Pt doing well and is ok to d/c from OT standpoint    Follow Up Recommendations  Supervision/Assistance - 24 hour;Supervision - Intermittent    Barriers to Discharge   None    Equipment Recommendations  None recommended by OT    Recommendations for Other Services    Frequency     Plan Discharge plan remains appropriate    Precautions / Restrictions Precautions Precautions: Fall Restrictions Weight Bearing Restrictions: Yes LLE Weight Bearing: Touchdown weight bearing   Pertinent Vitals/Pain     ADL  Grooming: Performed;Wash/dry hands;Wash/dry face;Supervision/safety Where Assessed - Grooming: Supported standing Lower Body Bathing: Supervision/safety;Set up;Min guard;Simulated Lower Body Dressing: Performed;Supervision/safety;Min guard;Set up Toilet Transfer: Performed;Modified independent;Supervision/safety Toilet Transfer Method: Sit to stand;Other (comment) (ambulating from RW level, maintains TWB) Toilet Transfer Equipment: Regular height toilet Toileting - Clothing Manipulation and Hygiene: Performed;Supervision/safety Where Assessed - Engineer, mining and Hygiene: Standing Tub/Shower Transfer: Performed;Min guard Web designer Method: Ecologist with back Equipment Used:  (RW)    OT Diagnosis:    OT Problem List:   OT Treatment Interventions:     OT Goals ADL Goals ADL Goal: Grooming - Progress: Progressing toward goals ADL Goal: Lower Body Dressing - Progress: Progressing toward goals ADL Goal: Toilet Transfer - Progress: Progressing toward goals ADL  Goal: Toileting - Clothing Manipulation - Progress: Progressing toward goals ADL Goal: Tub/Shower Transfer - Progress: Progressing toward goals  Visit Information  Last OT Received On: 01/25/13    Subjective Data  Subjective: " My friend is coming to pick me up today " Patient Stated Goal: To return to friend's house   Prior Functioning       Cognition  Cognition Overall Cognitive Status: Appears within functional limits for tasks assessed/performed Arousal/Alertness: Awake/alert Orientation Level: Appears intact for tasks assessed Behavior During Session: [facility] for tasks performed    Mobility  Bed Mobility Bed Mobility: Not assessed Transfers Transfers: Sit to Stand;Stand to Sit Sit to Stand: 6: Modified independent (Device/Increase time) Stand to Sit: 6: Modified independent (Device/Increase time)    Exercises      Balance Balance Balance Assessed: No   End of Session OT - End of Session Equipment Utilized During Treatment:  (RW) Activity Tolerance: Patient tolerated treatment well Patient left: in chair;with call bell/phone within reach  GO     [name] 01/25/2013, 2:45 PM

## 2013-01-25 NOTE — Discharge Summary (Signed)
Physician Discharge Summary  Patient ID: Frederick [name] MRN: [id number] DOB/AGE: [birth year] 52 y.o.  Admit date: 01/21/2013 Discharge date: 01/25/2013  Admission Diagnoses:  Closed fracture of left distal tibia  Discharge Diagnoses:  Principal Problem:   Closed fracture of left distal distal tibia Active Problems:   EtOH dependence   Hernia   Fracture of fibula, proximal   Past Medical History  Diagnosis Date  . Arthritis   . Asthma   . EtOH dependence     drinks 2-3 40s a day  . Fibula fracture     Dr Dion Saucier 02-2011  . Hernia     surgery 01-17-13    Surgeries: Procedure(s): INTRAMEDULLARY (IM) NAIL TIBIAL on 01/21/2013 - 01/22/2013   Consultants (if any):    Discharged Condition: Improved  Hospital Course: Frederick [name] is an 52 y.o. male who was admitted 01/21/2013 with a diagnosis of Closed fracture of left distal tibia and went to the operating room on 01/21/2013 - 01/22/2013 and underwent the above named procedures.    He was given perioperative antibiotics:  Anti-infectives   Start     Dose/Rate Route Frequency Ordered Stop   01/22/13 2300  ceFAZolin (ANCEF) IVPB 2 g/50 mL premix     2 g 100 mL/hr over 30 Minutes Intravenous Every 6 hours 01/22/13 2058 01/23/13 1050   01/22/13 0600  ceFAZolin (ANCEF) IVPB 2 g/50 mL premix     2 g 100 mL/hr over 30 Minutes Intravenous On call to O.R. 01/22/13 0134 01/22/13 1720    .  He was given sequential compression devices, early ambulation, and lovenox for DVT prophylaxis.  He benefited maximally from the hospital stay and there were no complications.    Recent vital signs:  Filed Vitals:   01/25/13 1345  BP: 126/84  Pulse: 98  Temp: 98.5 F (36.9 C)  Resp: 20    Recent laboratory studies:  Lab Results  Component Value Date   HGB 13.2 01/22/2013   HGB 12.3* 01/22/2013   HGB 13.2 01/21/2013   Lab Results  Component Value Date   WBC 7.4 01/22/2013   PLT 153 01/22/2013   Lab Results  Component Value Date    INR 0.97 01/22/2013   Lab Results  Component Value Date   NA 141 01/22/2013   K 3.7 01/22/2013   CL 102 01/22/2013   CO2 30 01/22/2013   BUN 10 01/22/2013   CREATININE 0.75 01/22/2013   GLUCOSE 105* 01/22/2013    Discharge Medications:     Medication List    STOP taking these medications       aspirin 81 MG chewable tablet     oxyCODONE-acetaminophen 5-325 MG per tablet  Commonly known as:  PERCOCET/ROXICET      TAKE these medications       aspirin EC 325 MG tablet  Take 1 tablet (325 mg total) by mouth daily.     diphenhydrAMINE 25 MG tablet  Commonly known as:  BENADRYL  Take 25 mg by mouth every 6 (six) hours as needed for itching.     EPINEPHrine 0.3 mg/0.3 mL Devi  Commonly known as:  EPIPEN  Inject 0.3 mLs (0.3 mg total) into the muscle as needed.     famotidine 20 MG tablet  Commonly known as:  PEPCID  Take 20 mg by mouth daily.     methocarbamol 500 MG tablet  Commonly known as:  ROBAXIN  Take 1 tablet (500 mg total) by mouth 4 (four) times daily.  oxyCODONE-acetaminophen 10-325 MG per tablet  Commonly known as:  PERCOCET  Take 1-2 tablets by mouth every 6 (six) hours as needed for pain. MAXIMUM TOTAL ACETAMINOPHEN DOSE IS 4000 MG PER DAY     predniSONE 50 MG tablet  Commonly known as:  DELTASONE  Take 50 mg by mouth daily.     promethazine 25 MG tablet  Commonly known as:  PHENERGAN  Take 1 tablet (25 mg total) by mouth every 6 (six) hours as needed for nausea.        Diagnostic Studies: Dg Tibia/fibula Left  01/22/2013  *RADIOLOGY REPORT*  Clinical Data: Left tibial intramedullary nail placement.  DG C-ARM 61-120 MIN,LEFT TIBIA AND FIBULA - 2 VIEW  Comparison:  Radiographs 01/21/2013.  Findings: Five spot fluoroscopic images demonstrate the interval placement of a tibial intramedullary nail secured by two proximal and two distal interlocking screws.  The alignment of the oblique fracture of the distal tibial diaphysis appears slightly improved. The  comminuted proximal fibular fracture appears unchanged. Preexisting distal fibular plate and screws are noted.  IMPRESSION: Intraoperative views during tibial IM nail fixation.  No demonstrated complication.   Original Report Authenticated By: Carey Bullocks, M.D.    Dg Tibia/fibula Left  01/21/2013  *RADIOLOGY REPORT*  Clinical Data: Ankle pain  LEFT TIBIA AND FIBULA - 2 VIEW  Comparison: Prior left ankle radiographs 02/16/2011  Findings: Acute comminuted fracture of the fibular head, and and acute minimally displaced spiral fracture of the distal tibial diaphysis.  The distal tibial fracture fragment is displaced laterally by approximately 6 mm with respect to the proximal fracture fragment.  Surgical changes of bilateral ORIF of a lateral malleolar fracture are noted.  No evidence of hardware complication or periprosthetic fracture.  There is bony ankylosis of the syndesmosis.  Well corticated ossified structures inferior to the medial and lateral malleolus suggest the sequela of a remote healed fracture.  There is associated soft tissue swelling.  Bony prominence along the medial femoral condyle suggests an underlying tug lesion  No knee joint effusion.  The proximal fracture fragment of the fibula is displaced posteriorly.  IMPRESSION:  1.  Comminuted fracture of the fibular head. 2.  Mildly displaced spiral fracture of the distal tibial diaphysis. 3.  Prior ORIF of a lateral malleolar fracture without evidence of hardware complication or periprosthetic fracture.   Original Report Authenticated By: Malachy Moan, M.D.    Dg Ankle Complete Left  01/21/2013  *RADIOLOGY REPORT*  Clinical Data: Ankle pain  LEFT ANKLE COMPLETE - 3+ VIEW  Comparison: Concurrently obtained radiographs of the left lower leg; prior radiographs of the left ankle 02/16/2011  Findings: Redemonstration of minimally displaced spiral fracture of the distal tibial diaphysis.  The distal fracture fragment is displaced laterally by 6 mm  in posteriorly by 11 mm with respect to the proximal fracture fragment.  Surgical changes of prior ORIF of a now healed lateral malleolar fracture with osseous bridging of the syndesmosis.  Well corticated ossicle inferior to the medial and lateral malleoli suggest healed remote the avulsion fractures. There is associated soft tissue swelling and deformity about the distal tibial fracture.  IMPRESSION: Displaced spiral fracture of the distal tibial diaphysis.  ORIF of a healed lateral malleolar fracture without evidence of hardware complication or periprosthetic fracture.  There is bony ankylosis of the adjacent syndesmosis.   Original Report Authenticated By: Malachy Moan, M.D.    US Scrotum  01/18/2013  *RADIOLOGY REPORT*  Clinical Data: Right testicular pain and mass.  SCROTAL ULTRASOUND DOPPLER ULTRASOUND OF THE TESTICLES  Technique:  Complete ultrasound examination of the testicles, epididymis, and other scrotal structures was performed.  Color and spectral Doppler ultrasound were also utilized to evaluate blood flow to the testicles.  Comparison:  None.  Findings:  The testicles are symmetric in size and echogenicity. The right testis measures 3.7 x 2.4 x 2.7 cm, while the left testis measures 3.9 x 1.9 x 3.1 cm.  No testicular masses are seen, and there is no evidence of microlithiasis.  The left epididymal head is unremarkable in appearance; the right epididymal head is not visualized due to the adjacent hernia.  Trace fluid about both testes remains within normal limits; no significant hydrocele is seen.  There is a large incarcerated right-sided inguinal hernia, superior to the right testis, displacing the testis inferiorly.  The visible portion of the inguinal hernia measures 7.4 cm in length.  The bowel wall appears mildly edematous.  Blood flow is seen within both testicles on color Doppler sonography.  Doppler spectral waveforms show both arterial and venous flow signal in both testicles.   IMPRESSION:  1.  Large incarcerated right-sided inguinal hernia, superior to the right testis, displacing the testis inferiorly.  The visible portion of the inguinal hernia measures 7.4 cm in length; the visualized bowel wall appears mildly edematous. 2.  Testes unremarkable in appearance.  No evidence for testicular torsion.   Original Report Authenticated By: Tonia Ghent, M.D.    [place] Art/ven Flow Abd Pelv Doppler  01/18/2013  *RADIOLOGY REPORT*  Clinical Data: Right testicular pain and mass.  SCROTAL ULTRASOUND DOPPLER ULTRASOUND OF THE TESTICLES  Technique:  Complete ultrasound examination of the testicles, epididymis, and other scrotal structures was performed.  Color and spectral Doppler ultrasound were also utilized to evaluate blood flow to the testicles.  Comparison:  None.  Findings:  The testicles are symmetric in size and echogenicity. The right testis measures 3.7 x 2.4 x 2.7 cm, while the left testis measures 3.9 x 1.9 x 3.1 cm.  No testicular masses are seen, and there is no evidence of microlithiasis.  The left epididymal head is unremarkable in appearance; the right epididymal head is not visualized due to the adjacent hernia.  Trace fluid about both testes remains within normal limits; no significant hydrocele is seen.  There is a large incarcerated right-sided inguinal hernia, superior to the right testis, displacing the testis inferiorly.  The visible portion of the inguinal hernia measures 7.4 cm in length.  The bowel wall appears mildly edematous.  Blood flow is seen within both testicles on color Doppler sonography.  Doppler spectral waveforms show both arterial and venous flow signal in both testicles.  IMPRESSION:  1.  Large incarcerated right-sided inguinal hernia, superior to the right testis, displacing the testis inferiorly.  The visible portion of the inguinal hernia measures 7.4 cm in length; the visualized bowel wall appears mildly edematous. 2.  Testes unremarkable in appearance.   No evidence for testicular torsion.   Original Report Authenticated By: Tonia Ghent, M.D.    Dg Tibia/fibula Left Port  01/23/2013  *RADIOLOGY REPORT*  Clinical Data: Postoperative evaluation  PORTABLE LEFT TIBIA AND FIBULA - 2 VIEW  Comparison: Intraoperative radiographs obtained earlier today, preoperative radiographs dated 01/21/2013  Findings: Interval ORIF of distal tibial diaphyseal fracture with intramedullary rod including to proximal to distal interlocking screws.  Unchanged appearance of comminuted fracture of the proximal fibula.  Stable prior surgical changes of ORIF of a now healed lateral malleolar  fracture.  IMPRESSION: Intramedullary rod ORIF of distal tibial fracture.  Alignment is improved compared to the preoperative radiographs.  No immediate hardware complication.   Original Report Authenticated By: Malachy Moan, M.D.    Dg C-arm [phone] Min  01/22/2013  *RADIOLOGY REPORT*  Clinical Data: Left tibial intramedullary nail placement.  DG C-ARM 61-120 MIN,LEFT TIBIA AND FIBULA - 2 VIEW  Comparison:  Radiographs 01/21/2013.  Findings: Five spot fluoroscopic images demonstrate the interval placement of a tibial intramedullary nail secured by two proximal and two distal interlocking screws.  The alignment of the oblique fracture of the distal tibial diaphysis appears slightly improved. The comminuted proximal fibular fracture appears unchanged. Preexisting distal fibular plate and screws are noted.  IMPRESSION: Intraoperative views during tibial IM nail fixation.  No demonstrated complication.   Original Report Authenticated By: Carey Bullocks, M.D.     Disposition: 01-Home or Self Care      Discharge Orders   Future Appointments Provider Department Dept Phone   02/08/2013 8:45 AM [name], MD,FACS [facility], [place] [phone]   Future Orders Complete By Expires     Call MD / Call 911  As directed     Comments:      If you experience chest pain or shortness of  breath, CALL 911 and be transported to the hospital emergency room.  If you develope a fever above 101 F, pus (white drainage) or increased drainage or redness at the wound, or calf pain, call your surgeon's office.    Constipation Prevention  As directed     Comments:      Drink plenty of fluids.  Prune juice may be helpful.  You may use a stool softener, such as Colace (over the counter) 100 mg twice a day.  Use MiraLax (over the counter) for constipation as needed.    Diet general  As directed     Discharge instructions  As directed     Comments:      Change dressing in 3 days and reapply fresh dressing, unless you have a splint (half cast).  If you have a splint/cast, just leave in place until your follow-up appointment.    Keep wounds dry for 3 weeks.  Leave steri-strips in place on skin.  Do not apply lotion or anything to the wound.    Touch down weight bearing  As directed     Touch down weight bearing  As directed        Follow-up Information   Follow up with Leannah Guse P, MD. Schedule an appointment as soon as possible for a visit in 2 weeks.   Contact information:   [address] [place] [address] [phone]        Signed: [name] 01/25/2013, 7:10 PM

## 2013-01-25 NOTE — Progress Notes (Signed)
Pt d/c'd to home.  Prescriptions and d/c instructions reviewed and given to patient to take with him.

## 2013-01-25 NOTE — Progress Notes (Signed)
Patient ID: Frederick [name], male   DOB: Sep 20, 1961, 52 y.o.   MRN: [id number]     Subjective:  Patient reports pain as mild.  He states that he has no home to go to anymore after the death of his wife.  We will order a social work consult  Objective:   VITALS:   Filed Vitals:   01/24/13 0800 01/24/13 1410 01/24/13 2120 01/25/13 0600  BP: 135/87 128/70 144/76 132/88  Pulse: 94 80 84 90  Temp: 98.3 F (36.8 C) 97.9 F (36.6 C) 98 F (36.7 C) 98.1 F (36.7 C)  TempSrc: Oral     Resp: 15 18 18 18   Height:      Weight:      SpO2: 100% 100% 100% 100%    ABD soft Sensation intact distally Dorsiflexion/Plantar flexion intact Incision: dressing C/D/I and no drainage   Lab Results  Component Value Date   WBC 7.4 01/22/2013   HGB 13.2 01/22/2013   HCT 38.0* 01/22/2013   MCV 94.8 01/22/2013   PLT 153 01/22/2013     Assessment/Plan: 3 Days Post-Op   Principal Problem:   Closed fracture of left distal distal tibia Active Problems:   EtOH dependence   Hernia   Fracture of fibula, proximal   Advance diet Up with therapy Plan for discharge tomorrow Social work consult for discharge planning for homeless patient   Haskel Khan 01/25/2013, 8:39 AM   Teryl Lucy, MD Cell [phone] Pager [phone]

## 2013-01-25 NOTE — Clinical Social Work Psychosocial (Addendum)
Clinical Social Work Department  BRIEF PSYCHOSOCIAL ASSESSMENT  Patient: Frederick [name]  Account Number: [id number] Admit date: 01/21/13 Clinical Social Worker Sabino Niemann, MSW Date/Time: 01/25/2013 Referred by: Physician Date Referred: 12/22/12 Referred for   homelessness  Other Referral:  Interview type: Patient  Other interview type: PSYCHOSOCIAL DATA  Living Status: Patient currently reports that he is homeless Admitted from facility:  Level of care:  Primary support name:McLain,Kevin   Primary support relationship to patient: relative Degree of support available:  Poor  CURRENT CONCERNS  Current Concerns   housing  Other Concerns:  SOCIAL WORK ASSESSMENT / PLAN  SW met with patient to discuss his living situation, Patient reported that his wife passed approximately a week ago and he has no where to stay. SW explored possible living situations with friends or family. Patient reported that he has a friend that he can stay with for a couple weeks. PT is recommending no follow up. Patient did report that he has applied for medicaid and has been trying to get disability for 3 weeks.          Status: Other assessment/ plan:  Information/referral to community resources:  Patient did not want an resources. Patient is aware of all the shelters available    PATIENT'S/FAMILY'S RESPONSE TO PLAN OF CARE:  Patient was appreciative of support provided by SW. Unfortunately, there are no resources available for patient. Clinical Social Worker will sign off for now as social work intervention is no longer needed. Please consult [place] again if new need arises.   Sabino Niemann, MSW [phone]

## 2013-02-03 ENCOUNTER — Emergency Department (HOSPITAL_COMMUNITY)
Admission: EM | Admit: 2013-02-03 | Discharge: 2013-02-03 | Disposition: A | Discharge status: Home / Self Care | Payer: Self-pay | Attending: Emergency Medicine | Admitting: Emergency Medicine

## 2013-02-03 ENCOUNTER — Encounter (HOSPITAL_COMMUNITY): Payer: Self-pay | Admitting: Family Medicine

## 2013-02-03 DIAGNOSIS — Z8781 Personal history of (healed) traumatic fracture: Secondary | ICD-10-CM | POA: Insufficient documentation

## 2013-02-03 DIAGNOSIS — K089 Disorder of teeth and supporting structures, unspecified: Secondary | ICD-10-CM | POA: Insufficient documentation

## 2013-02-03 DIAGNOSIS — Z8719 Personal history of other diseases of the digestive system: Secondary | ICD-10-CM | POA: Insufficient documentation

## 2013-02-03 DIAGNOSIS — F1020 Alcohol dependence, uncomplicated: Secondary | ICD-10-CM | POA: Insufficient documentation

## 2013-02-03 DIAGNOSIS — J45909 Unspecified asthma, uncomplicated: Secondary | ICD-10-CM | POA: Insufficient documentation

## 2013-02-03 DIAGNOSIS — Z79899 Other long term (current) drug therapy: Secondary | ICD-10-CM | POA: Insufficient documentation

## 2013-02-03 DIAGNOSIS — K0889 Other specified disorders of teeth and supporting structures: Secondary | ICD-10-CM

## 2013-02-03 DIAGNOSIS — Z7982 Long term (current) use of aspirin: Secondary | ICD-10-CM | POA: Insufficient documentation

## 2013-02-03 DIAGNOSIS — F17200 Nicotine dependence, unspecified, uncomplicated: Secondary | ICD-10-CM | POA: Insufficient documentation

## 2013-02-03 DIAGNOSIS — Z8739 Personal history of other diseases of the musculoskeletal system and connective tissue: Secondary | ICD-10-CM | POA: Insufficient documentation

## 2013-02-03 MED ORDER — LIDOCAINE-EPINEPHRINE (PF) 2 %-1:200000 IJ SOLN
10.0000 mL | Freq: Once | INTRAMUSCULAR | Status: AC
  Administered 2013-02-03: 10 mL

## 2013-02-03 MED ORDER — NAPROXEN 500 MG PO TABS: 500.0000 mg | ORAL_TABLET | Freq: Two times a day (BID) | ORAL | Status: DC

## 2013-02-03 MED ORDER — BUPIVACAINE-EPINEPHRINE PF 0.5-1:200000 % IJ SOLN
1.8000 mL | Freq: Once | INTRAMUSCULAR | Status: AC
  Administered 2013-02-03: 9 mg

## 2013-02-03 MED ORDER — OXYCODONE-ACETAMINOPHEN 5-325 MG PO TABS: 1.0000 | ORAL_TABLET | Freq: Four times a day (QID) | ORAL | Status: DC | PRN

## 2013-02-03 NOTE — ED Provider Notes (Signed)
History     CSN: [id number]  Arrival date & time 02/03/13  0909   First MD Initiated Contact with Patient 02/03/13 [phone]      Chief Complaint  Patient presents with  . Dental Pain    (Consider location/radiation/quality/duration/timing/severity/associated sxs/prior treatment) HPI Comments: Patient resents with complaint of right mandibular dental pain for the past 2-3 days. Pain is nonradiating and is described as a "throbbing". Patient has a history of dental pain and cavities. He has had several teeth removed. Patient recently underwent surgery on his left ankle for fracture. He's been on oxycodone 10/325. Patient states that he is currently out of these. He thinks that some of these were stolen and states that he been taking 2 a day. Patient states that he is homeless. He denies fever, no, neck swelling. No trouble swallowing. Onset gradual. Course is constant. Nothing makes symptoms better or worse.  Patient is a 52 y.o. male presenting with tooth pain. The history is provided by the patient.  Dental PainPrimary symptoms do not include headaches, fever, shortness of breath or sore throat.  Additional symptoms do not include: facial swelling, trouble swallowing and ear pain.    Past Medical History  Diagnosis Date  . Arthritis   . Asthma   . EtOH dependence     drinks 2-3 40s a day  . Fibula fracture     Dr Dion Saucier 02-2011  . Hernia     surgery 01-17-13    Past Surgical History  Procedure Laterality Date  . Ankle surgery    . Inguinal hernia repair Right 01/19/2013    Procedure: HERNIA REPAIR INGUINAL ADULT;  Surgeon: [name], MD,FACS;  Location: [facility] OR;  Service: General;  Laterality: Right;  . Tibia im nail insertion Left 01/22/2013    Procedure: INTRAMEDULLARY (IM) NAIL TIBIAL;  Surgeon: [name], MD;  Location: [facility] OR;  Service: Orthopedics;  Laterality: Left;    History reviewed. No pertinent family history.  History  Substance Use Topics  . Smoking status:  Current Every Day Smoker -- 1.00 packs/day    Types: Cigarettes  . Smokeless tobacco: Not on file     Comment: 1 ppd  . Alcohol Use: 8.4 oz/week    14 Cans of beer per week     Comment: occasionally; generally drinks (2) 40s/day      Review of Systems  Constitutional: Negative for fever.  HENT: Positive for dental problem. Negative for ear pain, sore throat, facial swelling, trouble swallowing and neck pain.   Respiratory: Negative for shortness of breath and stridor.   Skin: Negative for color change.  Neurological: Negative for headaches.    Allergies  Review of patient's allergies indicates no known allergies.  Home Medications   Current Outpatient Rx  Name  Route  Sig  Dispense  Refill  . aspirin EC 325 MG tablet   Oral   Take 1 tablet (325 mg total) by mouth daily.   30 tablet   0   . diphenhydrAMINE (BENADRYL) 25 MG tablet   Oral   Take 25 mg by mouth every 6 (six) hours as needed for itching.         . famotidine (PEPCID) 20 MG tablet   Oral   Take 20 mg by mouth daily.         . methocarbamol (ROBAXIN) 500 MG tablet   Oral   Take 1 tablet (500 mg total) by mouth 4 (four) times daily.   75 tablet  1   . oxyCODONE-acetaminophen (PERCOCET) 10-325 MG per tablet   Oral   Take 1-2 tablets by mouth every 6 (six) hours as needed for pain. MAXIMUM TOTAL ACETAMINOPHEN DOSE IS 4000 MG PER DAY   75 tablet   0   . predniSONE (DELTASONE) 50 MG tablet   Oral   Take 50 mg by mouth daily.         . promethazine (PHENERGAN) 25 MG tablet   Oral   Take 1 tablet (25 mg total) by mouth every 6 (six) hours as needed for nausea.   30 tablet   0   . EPINEPHrine (EPIPEN) 0.3 mg/0.3 mL DEVI   Intramuscular   Inject 0.3 mLs (0.3 mg total) into the muscle as needed.   2 Device   1     BP 137/84  Pulse 107  Temp(Src) 97.8 F (36.6 C) (Oral)  Resp 18  SpO2 99%  Physical Exam  Nursing note and vitals reviewed. Constitutional: He appears well-developed and  well-nourished.  HENT:  Head: Normocephalic and atraumatic. No trismus in the jaw.  Right Ear: Tympanic membrane, external ear and ear canal normal.  Left Ear: Tympanic membrane, external ear and ear canal normal.  Nose: Nose normal.  Mouth/Throat: Uvula is midline, oropharynx is clear and moist and mucous membranes are normal. Abnormal dentition. Dental caries present. No dental abscesses or edematous. No tonsillar abscesses.  Advanced periodontal disease. Broken tooth noted in the area of the right mandibular premolars. No swelling or erythema noted on exam.  Eyes: Pupils are equal, round, and reactive to light.  Neck: Normal range of motion. Neck supple.  No neck swelling or Lugwig's angina  Neurological: He is alert.  Skin: Skin is warm and dry.  Psychiatric: He has a normal mood and affect.    ED Course  Procedures (including critical care time)  Labs Reviewed - No data to display No results found.   1. Pain, dental     10:01 AM Patient seen and examined.  Medications ordered.   Vital signs reviewed and are as follows: Filed Vitals:   02/03/13 0917  BP: 137/84  Pulse: 107  Temp: 97.8 F (36.6 C)  Resp: 18      10:31 AM Dental block was performed. 1.54mL of 2% lidocaine with epi was combined with 1.56mL 0.5% bupivacaine with epi and an alveolar block was performed using 25 gauge needle. Injections made at base of tooth as well as the space immediately posterior and anterior to tooth. Adequate anesthesia was obtained. Minimal bleeding after injections. Patient tolerated procedure well with no immediate complications.   Patient counseled to take prescribed medications as directed, return with worsening facial or neck swelling, and to follow-up with their dentist as soon as possible.     MDM  Patient with toothache.  No gross abscess.  Exam unconcerning for Ludwig's angina or other deep tissue infection in neck.  Will treat with pain medicine.  Urged patient to follow-up  with dentist.           Renne Crigler, PA 02/03/13 1036

## 2013-02-03 NOTE — ED Provider Notes (Signed)
Medical screening examination/treatment/procedure(s) were performed by non-physician practitioner and as supervising physician I was immediately available for consultation/collaboration.    Vida Roller, MD 02/03/13 [phone]

## 2013-02-03 NOTE — ED Notes (Signed)
Per pt sts 2 days of tooth pain and whole in tooth. Right bottom tooth

## 2013-02-03 NOTE — ED Notes (Signed)
Pt presents to department for evaluation of R lower molar pain. Ongoing x2 days. No facial swelling. Respirations unlabored. Pt is alert and oriented x4. 7/10 pain at the time.

## 2013-02-08 ENCOUNTER — Encounter (INDEPENDENT_AMBULATORY_CARE_PROVIDER_SITE_OTHER): Payer: Self-pay | Admitting: General Surgery

## 2013-03-08 ENCOUNTER — Encounter (INDEPENDENT_AMBULATORY_CARE_PROVIDER_SITE_OTHER): Payer: Self-pay | Admitting: General Surgery

## 2013-03-09 ENCOUNTER — Emergency Department (HOSPITAL_COMMUNITY)
Admission: EM | Admit: 2013-03-09 | Discharge: 2013-03-09 | Disposition: A | Discharge status: Home / Self Care | Payer: Self-pay | Attending: Emergency Medicine | Admitting: Emergency Medicine

## 2013-03-09 ENCOUNTER — Emergency Department (HOSPITAL_COMMUNITY): Payer: Self-pay

## 2013-03-09 ENCOUNTER — Encounter (HOSPITAL_COMMUNITY): Payer: Self-pay

## 2013-03-09 DIAGNOSIS — Y9301 Activity, walking, marching and hiking: Secondary | ICD-10-CM | POA: Insufficient documentation

## 2013-03-09 DIAGNOSIS — J45909 Unspecified asthma, uncomplicated: Secondary | ICD-10-CM | POA: Insufficient documentation

## 2013-03-09 DIAGNOSIS — W010XXA Fall on same level from slipping, tripping and stumbling without subsequent striking against object, initial encounter: Secondary | ICD-10-CM | POA: Insufficient documentation

## 2013-03-09 DIAGNOSIS — S2241XA Multiple fractures of ribs, right side, initial encounter for closed fracture: Secondary | ICD-10-CM

## 2013-03-09 DIAGNOSIS — F17200 Nicotine dependence, unspecified, uncomplicated: Secondary | ICD-10-CM | POA: Insufficient documentation

## 2013-03-09 DIAGNOSIS — Z79899 Other long term (current) drug therapy: Secondary | ICD-10-CM | POA: Insufficient documentation

## 2013-03-09 DIAGNOSIS — Z8781 Personal history of (healed) traumatic fracture: Secondary | ICD-10-CM | POA: Insufficient documentation

## 2013-03-09 DIAGNOSIS — W1809XA Striking against other object with subsequent fall, initial encounter: Secondary | ICD-10-CM | POA: Insufficient documentation

## 2013-03-09 DIAGNOSIS — Y929 Unspecified place or not applicable: Secondary | ICD-10-CM | POA: Insufficient documentation

## 2013-03-09 DIAGNOSIS — S2249XA Multiple fractures of ribs, unspecified side, initial encounter for closed fracture: Secondary | ICD-10-CM | POA: Insufficient documentation

## 2013-03-09 DIAGNOSIS — Z8719 Personal history of other diseases of the digestive system: Secondary | ICD-10-CM | POA: Insufficient documentation

## 2013-03-09 DIAGNOSIS — Z8739 Personal history of other diseases of the musculoskeletal system and connective tissue: Secondary | ICD-10-CM | POA: Insufficient documentation

## 2013-03-09 LAB — URINALYSIS, ROUTINE W REFLEX MICROSCOPIC
Glucose, UA: NEGATIVE mg/dL
Leukocytes, UA: NEGATIVE
Protein, ur: NEGATIVE mg/dL
Specific Gravity, Urine: 1.023 (ref 1.005–1.030)
Urobilinogen, UA: 2 mg/dL — ABNORMAL HIGH (ref 0.0–1.0)

## 2013-03-09 LAB — URINE MICROSCOPIC-ADD ON

## 2013-03-09 MED ORDER — OXYCODONE-ACETAMINOPHEN 5-325 MG PO TABS
1.0000 | ORAL_TABLET | Freq: Once | ORAL | Status: AC
  Administered 2013-03-09: 1 via ORAL
  Filled 2013-03-09: qty 1

## 2013-03-09 MED ORDER — OXYCODONE-ACETAMINOPHEN 5-325 MG PO TABS: 1.0000 | ORAL_TABLET | Freq: Four times a day (QID) | ORAL | Status: DC | PRN

## 2013-03-09 NOTE — ED Notes (Signed)
Pt states he slipped and fell 2 days ago and landed on his right ribs. C/o still having pain there.

## 2013-03-09 NOTE — ED Provider Notes (Signed)
History    This chart was scribed for non-physician practitioner working with Gerhard Munch, MD by Frederik Pear, ED Scribe. This patient was seen in room TR09C/TR09C and the patient's care was started at 1835.   CSN: [id number]  Arrival date & time 03/09/13  1726   First MD Initiated Contact with Patient 03/09/13 1835      Chief Complaint  Patient presents with  . Fall  . Rib Injury    (Consider location/radiation/quality/duration/timing/severity/associated sxs/prior treatment) Patient is a 52 y.o. male presenting with fall and chest pain. The history is provided by the patient and medical records. No language interpreter was used.  Fall The accident occurred 2 days ago. The fall occurred while walking. He fell from an unknown height. Impact surface: crate of DVDs. Point of impact: right ribs. Pain location: right ribs. There was no entrapment after the fall.  Chest Pain   Frederick [name] is a 52 y.o. male who presents to the Emergency Department complaining of sudden onset, constant, sharp, right rib pain that is aggravated by deep breaths, movement and coughing that began 2 days ago when he lost his balance due to the cast on his left leg and fell over a crate of DVDs. He denies any dizziness. He reports that he has been treating the pain with aspirin, Percocet, and Naprosyn without relief.   Past Medical History  Diagnosis Date  . Arthritis   . Asthma   . EtOH dependence     drinks 2-3 40s a day  . Fibula fracture     Dr Dion Saucier 02-2011  . Hernia     surgery 01-17-13    Past Surgical History  Procedure Laterality Date  . Ankle surgery    . Inguinal hernia repair Right 01/19/2013    Procedure: HERNIA REPAIR INGUINAL ADULT;  Surgeon: [name], MD,FACS;  Location: [facility] OR;  Service: General;  Laterality: Right;  . Tibia im nail insertion Left 01/22/2013    Procedure: INTRAMEDULLARY (IM) NAIL TIBIAL;  Surgeon: [name], MD;  Location: [facility] OR;  Service: Orthopedics;   Laterality: Left;    No family history on file.  History  Substance Use Topics  . Smoking status: Current Every Day Smoker -- 1.00 packs/day    Types: Cigarettes  . Smokeless tobacco: Not on file     Comment: 1 ppd  . Alcohol Use: 8.4 oz/week    14 Cans of beer per week     Comment: occasionally; generally drinks (2) 40s/day      Review of Systems  Musculoskeletal:       Right rib pain.  All other systems reviewed and are negative.    Allergies  Review of patient's allergies indicates no known allergies.  Home Medications   Current Outpatient Rx  Name  Route  Sig  Dispense  Refill  . EPINEPHrine (EPIPEN) 0.3 mg/0.3 mL DEVI   Intramuscular   Inject 0.3 mLs (0.3 mg total) into the muscle as needed.   2 Device   1   . methocarbamol (ROBAXIN) 500 MG tablet   Oral   Take 1 tablet (500 mg total) by mouth 4 (four) times daily.   75 tablet   1   . naproxen (NAPROSYN) 500 MG tablet   Oral   Take 1 tablet (500 mg total) by mouth 2 (two) times daily.   20 tablet   0   . oxyCODONE-acetaminophen (PERCOCET/ROXICET) 5-325 MG per tablet   Oral   Take 1-2 tablets  by mouth every 6 (six) hours as needed for pain.   10 tablet   0     BP 124/85  Temp(Src) 100.6 F (38.1 C) (Oral)  Resp 14  SpO2 97%  Physical Exam  Nursing note and vitals reviewed. Constitutional: He is oriented to person, place, and time. He appears well-developed and well-nourished. No distress.  HENT:  Head: Normocephalic and atraumatic.  Eyes: EOM are normal. Pupils are equal, round, and reactive to light.  Neck: Normal range of motion. Neck supple. No tracheal deviation present.  Cardiovascular: Normal rate.   Pulmonary/Chest: Effort normal and breath sounds normal. No respiratory distress. He has no wheezes. He has no rales. He exhibits no tenderness.  Abdominal: Soft. He exhibits no distension.  Musculoskeletal: Normal range of motion. He exhibits tenderness. He exhibits no edema.  Tender  over right lower rib pain adjacent to the flank area with mild ecchymosis.  Neurological: He is alert and oriented to person, place, and time.  Skin: Skin is warm and dry.  Psychiatric: He has a normal mood and affect. His behavior is normal.    ED Course  Procedures (including critical care time)  DIAGNOSTIC STUDIES: Oxygen Saturation is 97% on room air, adequate by my interpretation.    COORDINATION OF CARE:  18:53- Discussed planned course of treatment with the patient, including a unilateral ribs with right chest X-ray, UA,  and Percocet, who is agreeable at this time.  19:15- Medication Orders- oxycodone-acetaminophen (percocet/roxicet) 5-325 mg per tablet 1 tablet- once.  Results for orders placed during the hospital encounter of 03/09/13  URINALYSIS, ROUTINE W REFLEX MICROSCOPIC      Result Value Range   Color, Urine YELLOW  YELLOW   APPearance TURBID (*) CLEAR   Specific Gravity, Urine 1.023  1.005 - 1.030   pH 7.5  5.0 - 8.0   Glucose, UA NEGATIVE  NEGATIVE mg/dL   Hgb urine dipstick NEGATIVE  NEGATIVE   Bilirubin Urine NEGATIVE  NEGATIVE   Ketones, ur NEGATIVE  NEGATIVE mg/dL   Protein, ur NEGATIVE  NEGATIVE mg/dL   Urobilinogen, UA 2.0 (*) 0.0 - 1.0 mg/dL   Nitrite NEGATIVE  NEGATIVE   Leukocytes, UA NEGATIVE  NEGATIVE  URINE MICROSCOPIC-ADD ON      Result Value Range   Bacteria, UA FEW (*) RARE   Urine-Other AMORPHOUS URATES/PHOSPHATES      Labs Reviewed  URINALYSIS, ROUTINE W REFLEX MICROSCOPIC - Abnormal; Notable for the following:    APPearance TURBID (*)    Urobilinogen, UA 2.0 (*)    All other components within normal limits  URINE MICROSCOPIC-ADD ON - Abnormal; Notable for the following:    Bacteria, UA FEW (*)    All other components within normal limits   Dg Ribs Unilateral W/chest Right  03/09/2013  *RADIOLOGY REPORT*  Clinical Data: Fall with right lower rib pain.  RIGHT RIBS AND CHEST - 3+ VIEW  Comparison: Chest x-ray on 03/22/2012  Findings:  Frontal chest radiograph shows no evidence of pneumothorax, consolidation or pleural fluid.  Right-sided rib films show acute fractures involving the right seventh, eighth, ninth and tenth ribs.  Eighth and ninth ribs show mild displacement.  Seventh and tenth rib fractures are essentially nondisplaced.  There is healed irregularity involving the distal aspect of the sixth rib suggestive of an old fracture.  IMPRESSION: Acute fractures of the right seventh, eighth, ninth and tenth ribs. Eighth and ninth rib fractures show mild displacement.  No associated pneumothorax or hemothorax.  Original Report Authenticated By: Irish Lack, M.D.      No diagnosis found.  Fall. Rib fractures.  Analgesics, incentive spirometer.  Return precautions provided.  MDM    I personally performed the services described in this documentation, which was scribed in my presence. The recorded information has been reviewed and is accurate.        Jimmye Norman, NP 03/09/13 [phone]

## 2013-03-09 NOTE — ED Notes (Signed)
Pt returned from radiology.

## 2013-03-10 ENCOUNTER — Encounter (HOSPITAL_COMMUNITY): Payer: Self-pay | Admitting: Physical Medicine and Rehabilitation

## 2013-03-10 ENCOUNTER — Inpatient Hospital Stay (HOSPITAL_COMMUNITY)
Admission: EM | Admit: 2013-03-10 | Discharge: 2013-03-13 | DRG: 390 | Disposition: A | Discharge status: Home / Self Care | Payer: Self-pay | Attending: Internal Medicine | Admitting: Internal Medicine

## 2013-03-10 ENCOUNTER — Emergency Department (HOSPITAL_COMMUNITY): Payer: Self-pay

## 2013-03-10 DIAGNOSIS — R1084 Generalized abdominal pain: Secondary | ICD-10-CM

## 2013-03-10 DIAGNOSIS — J45909 Unspecified asthma, uncomplicated: Secondary | ICD-10-CM | POA: Diagnosis present

## 2013-03-10 DIAGNOSIS — M129 Arthropathy, unspecified: Secondary | ICD-10-CM | POA: Diagnosis present

## 2013-03-10 DIAGNOSIS — F17200 Nicotine dependence, unspecified, uncomplicated: Secondary | ICD-10-CM | POA: Diagnosis present

## 2013-03-10 DIAGNOSIS — K56609 Unspecified intestinal obstruction, unspecified as to partial versus complete obstruction: Principal | ICD-10-CM | POA: Diagnosis present

## 2013-03-10 DIAGNOSIS — K469 Unspecified abdominal hernia without obstruction or gangrene: Secondary | ICD-10-CM

## 2013-03-10 DIAGNOSIS — R112 Nausea with vomiting, unspecified: Secondary | ICD-10-CM | POA: Diagnosis present

## 2013-03-10 DIAGNOSIS — S82302S Unspecified fracture of lower end of left tibia, sequela: Secondary | ICD-10-CM

## 2013-03-10 DIAGNOSIS — S82832S Other fracture of upper and lower end of left fibula, sequela: Secondary | ICD-10-CM

## 2013-03-10 DIAGNOSIS — F1020 Alcohol dependence, uncomplicated: Secondary | ICD-10-CM | POA: Diagnosis present

## 2013-03-10 DIAGNOSIS — S82402S Unspecified fracture of shaft of left fibula, sequela: Secondary | ICD-10-CM

## 2013-03-10 LAB — COMPREHENSIVE METABOLIC PANEL
Albumin: 3.6 g/dL (ref 3.5–5.2)
Alkaline Phosphatase: 124 U/L — ABNORMAL HIGH (ref 39–117)
BUN: 9 mg/dL (ref 6–23)
Calcium: 9.9 mg/dL (ref 8.4–10.5)
GFR calc Af Amer: >90 mL/min (ref >90)
Potassium: 4.1 mEq/L (ref 3.5–5.1)
Total Protein: 7.6 g/dL (ref 6.0–8.3)

## 2013-03-10 LAB — URINALYSIS, ROUTINE W REFLEX MICROSCOPIC
Bilirubin Urine: NEGATIVE
Leukocytes, UA: NEGATIVE
Nitrite: NEGATIVE
Specific Gravity, Urine: 1.022 (ref 1.005–1.030)
Urobilinogen, UA: 2 mg/dL — ABNORMAL HIGH (ref 0.0–1.0)
pH: 8 (ref 5.0–8.0)

## 2013-03-10 LAB — CBC WITH DIFFERENTIAL/PLATELET
Basophils Relative: 0 % (ref 0–1)
Eosinophils Absolute: 0.1 10*3/uL (ref 0.0–0.7)
Eosinophils Relative: 1 % (ref 0–5)
MCH: 32.3 pg (ref 26.0–34.0)
MCHC: 35.4 g/dL (ref 30.0–36.0)
MCV: 91.3 fL (ref 78.0–100.0)
Monocytes Relative: 10 % (ref 3–12)
Neutrophils Relative %: 80 % — ABNORMAL HIGH (ref 43–77)
Platelets: 204 10*3/uL (ref 150–400)

## 2013-03-10 LAB — LIPASE, BLOOD: Lipase: 12 U/L (ref 11–59)

## 2013-03-10 MED ORDER — ONDANSETRON HCL 4 MG/2ML IJ SOLN: 4.0000 mg | Freq: Four times a day (QID) | INTRAMUSCULAR | Status: DC | PRN

## 2013-03-10 MED ORDER — ACETAMINOPHEN 650 MG RE SUPP: 650.0000 mg | Freq: Four times a day (QID) | RECTAL | Status: DC | PRN

## 2013-03-10 MED ORDER — SODIUM CHLORIDE 0.9 % IV SOLN
INTRAVENOUS | Status: DC
  Administered 2013-03-11 – 2013-03-12 (×3): via INTRAVENOUS

## 2013-03-10 MED ORDER — THIAMINE HCL 100 MG/ML IJ SOLN
100.0000 mg | Freq: Every day | INTRAMUSCULAR | Status: DC
  Filled 2013-03-10 (×2): qty 1

## 2013-03-10 MED ORDER — HYDROMORPHONE HCL PF 1 MG/ML IJ SOLN
0.5000 mg | INTRAMUSCULAR | Status: DC | PRN
  Administered 2013-03-11 – 2013-03-13 (×9): 1 mg via INTRAVENOUS
  Filled 2013-03-10 (×9): qty 1

## 2013-03-10 MED ORDER — SODIUM CHLORIDE 0.9 % IJ SOLN
3.0000 mL | Freq: Two times a day (BID) | INTRAMUSCULAR | Status: DC
  Administered 2013-03-11 – 2013-03-13 (×3): 3 mL via INTRAVENOUS

## 2013-03-10 MED ORDER — ENOXAPARIN SODIUM 40 MG/0.4ML ~~LOC~~ SOLN
40.0000 mg | SUBCUTANEOUS | Status: DC
  Administered 2013-03-11 – 2013-03-13 (×3): 40 mg via SUBCUTANEOUS
  Filled 2013-03-10 (×3): qty 0.4

## 2013-03-10 MED ORDER — MORPHINE SULFATE 4 MG/ML IJ SOLN
4.0000 mg | Freq: Once | INTRAMUSCULAR | Status: AC
  Administered 2013-03-10: 4 mg via INTRAVENOUS
  Filled 2013-03-10: qty 1

## 2013-03-10 MED ORDER — SODIUM CHLORIDE 0.9 % IV SOLN: INTRAVENOUS | Status: AC

## 2013-03-10 MED ORDER — FOLIC ACID 1 MG PO TABS
1.0000 mg | ORAL_TABLET | Freq: Every day | ORAL | Status: DC
  Administered 2013-03-11 – 2013-03-13 (×3): 1 mg via ORAL
  Filled 2013-03-10 (×3): qty 1

## 2013-03-10 MED ORDER — ONDANSETRON HCL 4 MG PO TABS: 4.0000 mg | ORAL_TABLET | Freq: Four times a day (QID) | ORAL | Status: DC | PRN

## 2013-03-10 MED ORDER — ADULT MULTIVITAMIN W/MINERALS CH
1.0000 | ORAL_TABLET | Freq: Every day | ORAL | Status: DC
  Administered 2013-03-11 – 2013-03-13 (×3): 1 via ORAL
  Filled 2013-03-10 (×3): qty 1

## 2013-03-10 MED ORDER — SODIUM CHLORIDE 0.9 % IV BOLUS (SEPSIS)
1000.0000 mL | Freq: Once | INTRAVENOUS | Status: AC
  Administered 2013-03-10: 1000 mL via INTRAVENOUS

## 2013-03-10 MED ORDER — LORAZEPAM 2 MG/ML IJ SOLN: 1.0000 mg | Freq: Four times a day (QID) | INTRAMUSCULAR | Status: DC | PRN

## 2013-03-10 MED ORDER — VITAMIN B-1 100 MG PO TABS
100.0000 mg | ORAL_TABLET | Freq: Every day | ORAL | Status: DC
  Administered 2013-03-11 – 2013-03-13 (×3): 100 mg via ORAL
  Filled 2013-03-10 (×3): qty 1

## 2013-03-10 MED ORDER — LORAZEPAM 1 MG PO TABS: 1.0000 mg | ORAL_TABLET | Freq: Four times a day (QID) | ORAL | Status: DC | PRN

## 2013-03-10 MED ORDER — LORAZEPAM 2 MG/ML IJ SOLN: 0.0000 mg | Freq: Two times a day (BID) | INTRAMUSCULAR | Status: DC

## 2013-03-10 MED ORDER — LORAZEPAM 2 MG/ML IJ SOLN: 0.0000 mg | Freq: Four times a day (QID) | INTRAMUSCULAR | Status: AC

## 2013-03-10 MED ORDER — IOHEXOL 300 MG/ML  SOLN
20.0000 mL | INTRAMUSCULAR | Status: AC
  Administered 2013-03-10: 50 mL via ORAL

## 2013-03-10 MED ORDER — PANTOPRAZOLE SODIUM 40 MG IV SOLR
40.0000 mg | Freq: Two times a day (BID) | INTRAVENOUS | Status: DC
  Administered 2013-03-11 – 2013-03-12 (×4): 40 mg via INTRAVENOUS
  Filled 2013-03-10 (×5): qty 40

## 2013-03-10 MED ORDER — ONDANSETRON HCL 4 MG/2ML IJ SOLN
4.0000 mg | Freq: Once | INTRAMUSCULAR | Status: AC
  Administered 2013-03-10: 4 mg via INTRAVENOUS
  Filled 2013-03-10: qty 2

## 2013-03-10 MED ORDER — IOHEXOL 300 MG/ML  SOLN
100.0000 mL | Freq: Once | INTRAMUSCULAR | Status: AC | PRN
  Administered 2013-03-10: 100 mL via INTRAVENOUS

## 2013-03-10 MED ORDER — ACETAMINOPHEN 325 MG PO TABS: 650.0000 mg | ORAL_TABLET | Freq: Four times a day (QID) | ORAL | Status: DC | PRN

## 2013-03-10 NOTE — ED Provider Notes (Signed)
History     CSN: [id number]  Arrival date & time 03/10/13  1721   First MD Initiated Contact with Patient 03/10/13 1722      Chief Complaint  Patient presents with  . Abdominal Pain  . Emesis    (Consider location/radiation/quality/duration/timing/severity/associated sxs/prior treatment) Patient is a 52 y.o. male presenting with abdominal pain and vomiting.  Abdominal Pain Pain location:  Periumbilical Pain quality: bloating   Pain radiates to:  Does not radiate Pain severity:  Severe Onset quality:  Gradual Duration:  1 day Timing:  Constant Progression:  Worsening Chronicity:  New Relieved by:  Nothing Associated symptoms: constipation, nausea and vomiting   Associated symptoms: no chest pain, no cough, no diarrhea, no fever, no flatus and no shortness of breath   Emesis Associated symptoms: abdominal pain   Associated symptoms: no diarrhea     Past Medical History  Diagnosis Date  . Arthritis   . Asthma   . EtOH dependence     drinks 2-3 40s a day  . Fibula fracture     Dr Dion Saucier 02-2011  . Hernia     surgery 01-17-13    Past Surgical History  Procedure Laterality Date  . Ankle surgery    . Inguinal hernia repair Right 01/19/2013    Procedure: HERNIA REPAIR INGUINAL ADULT;  Surgeon: [name], MD,FACS;  Location: [facility] OR;  Service: General;  Laterality: Right;  . Tibia im nail insertion Left 01/22/2013    Procedure: INTRAMEDULLARY (IM) NAIL TIBIAL;  Surgeon: [name], MD;  Location: [facility] OR;  Service: Orthopedics;  Laterality: Left;    History reviewed. No pertinent family history.  History  Substance Use Topics  . Smoking status: Current Every Day Smoker -- 1.00 packs/day    Types: Cigarettes  . Smokeless tobacco: Not on file     Comment: 1 ppd  . Alcohol Use: 8.4 oz/week    14 Cans of beer per week     Comment: occasionally; generally drinks (2) 40s/day      Review of Systems  Constitutional: Negative for fever.  HENT: Negative for  congestion and facial swelling.   Respiratory: Negative for cough and shortness of breath.   Cardiovascular: Negative for chest pain.  Gastrointestinal: Positive for nausea, vomiting, abdominal pain and constipation. Negative for diarrhea and flatus.  All other systems reviewed and are negative.    Allergies  Review of patient's allergies indicates no known allergies.  Home Medications   Current Outpatient Rx  Name  Route  Sig  Dispense  Refill  . EPINEPHrine (EPIPEN) 0.3 mg/0.3 mL DEVI   Intramuscular   Inject 0.3 mLs (0.3 mg total) into the muscle as needed.   2 Device   1   . methocarbamol (ROBAXIN) 500 MG tablet   Oral   Take 1 tablet (500 mg total) by mouth 4 (four) times daily.   75 tablet   1   . naproxen (NAPROSYN) 500 MG tablet   Oral   Take 1 tablet (500 mg total) by mouth 2 (two) times daily.   20 tablet   0   . oxyCODONE-acetaminophen (PERCOCET/ROXICET) 5-325 MG per tablet   Oral   Take 1-2 tablets by mouth every 6 (six) hours as needed for pain.   10 tablet   0   . oxyCODONE-acetaminophen (PERCOCET/ROXICET) 5-325 MG per tablet   Oral   Take 1 tablet by mouth every 6 (six) hours as needed for pain.   15 tablet  0     BP 176/93  Pulse 77  Temp(Src) 98.4 F (36.9 C) (Oral)  Resp 18  SpO2 99%  Physical Exam  Nursing note and vitals reviewed. Constitutional: He is oriented to person, place, and time. He appears well-developed and well-nourished. No distress.  HENT:  Head: Normocephalic and atraumatic.  Mouth/Throat: Oropharynx is clear and moist.  Eyes: Conjunctivae are normal. Pupils are equal, round, and reactive to light. No scleral icterus.  Neck: Normal range of motion. Neck supple.  Cardiovascular: Normal rate, regular rhythm, normal heart sounds and intact distal pulses.   No murmur heard. Pulmonary/Chest: Effort normal and breath sounds normal. No stridor. No respiratory distress. He has no wheezes. He has no rales.  Abdominal: Soft.  He exhibits no distension. Bowel sounds are decreased. There is tenderness in the periumbilical area and suprapubic area. There is no rigidity, no rebound and no guarding.  Musculoskeletal: Normal range of motion. He exhibits no edema.  Left lower leg in cast  Neurological: He is alert and oriented to person, place, and time.  Skin: Skin is warm and dry. No rash noted.  Psychiatric: He has a normal mood and affect. His behavior is normal.    ED Course  Procedures (including critical care time)  Labs Reviewed  CBC WITH DIFFERENTIAL - Abnormal; Notable for the following:    Neutrophils Relative 80 (*)    Lymphocytes Relative 9 (*)    Lymphs Abs 0.6 (*)    All other components within normal limits  COMPREHENSIVE METABOLIC PANEL - Abnormal; Notable for the following:    Sodium 134 (*)    Chloride 95 (*)    Glucose, Bld 108 (*)    Alkaline Phosphatase 124 (*)    All other components within normal limits  URINALYSIS, ROUTINE W REFLEX MICROSCOPIC - Abnormal; Notable for the following:    Ketones, ur 15 (*)    Urobilinogen, UA 2.0 (*)    All other components within normal limits  LIPASE, BLOOD   Dg Ribs Unilateral W/chest Right  03/09/2013  *RADIOLOGY REPORT*  Clinical Data: Fall with right lower rib pain.  RIGHT RIBS AND CHEST - 3+ VIEW  Comparison: Chest x-ray on 03/22/2012  Findings: Frontal chest radiograph shows no evidence of pneumothorax, consolidation or pleural fluid.  Right-sided rib films show acute fractures involving the right seventh, eighth, ninth and tenth ribs.  Eighth and ninth ribs show mild displacement.  Seventh and tenth rib fractures are essentially nondisplaced.  There is healed irregularity involving the distal aspect of the sixth rib suggestive of an old fracture.  IMPRESSION: Acute fractures of the right seventh, eighth, ninth and tenth ribs. Eighth and ninth rib fractures show mild displacement.  No associated pneumothorax or hemothorax.   Original Report  Authenticated By: Irish Lack, M.D.    Ct Abdomen Pelvis W Contrast  03/10/2013  *RADIOLOGY REPORT*  Clinical Data: Nausea and vomiting for past day.  Alcohol dependence.  CT ABDOMEN AND PELVIS WITH CONTRAST  Technique:  Multidetector CT imaging of the abdomen and pelvis was performed following the standard protocol during bolus administration of intravenous contrast.  Contrast: OMNIPAQUE IOHEXOL 300 MG/ML  SOLN  Comparison: No comparison exam available for direct review.  There is a report from prior CT performed 03/22/2006.  Findings: Peripheral aspect of the posterior right middle lobe 1.6 cm mass.  Comment was not made upon this previously.  Malignancy is a possibility.  CT chest recommended for further delineation.  This can be  performed without contrast as the patient recently receive contrast.  Markedly dilated fluid and contrast filled stomach.  Slightly dilated proximal small bowel loops.  Point of obstruction not identified.  There is a change in caliber with distal small bowel loops of normal size.  Underlying adhesion or abnormality not excluded although not detected on present exam.  No free intraperitoneal air or free fluid.  No abdominal wall hernia.  Fatty liver without focal hepatic lesion.  No worrisome splenic, pancreatic, renal or adrenal lesion.  No calcified gallstones.  No abdominal aortic aneurysm.  Atherosclerotic type changes aortic bifurcation and and iliac arteries with mild ectasia of the right iliac artery  Noncontrast filled views of the urinary bladder unremarkable.  No bony destructive lesion.  No adenopathy.  IMPRESSION: Peripheral aspect of the posterior right middle lobe 1.6 cm mass. Comment was not made upon this previously.  Malignancy is a possibility.  CT chest recommended for further delineation.  This can be performed without contrast as the patient recently receive contrast.  Markedly dilated fluid and contrast filled stomach.  Slightly dilated proximal small  bowel loops.  Point of obstruction not identified.  There is a change in caliber with distal small bowel loops of normal size.  Underlying adhesion or abnormality not excluded although not detected on present exam.  No free intraperitoneal air or free fluid.  No abdominal wall hernia.  Atherosclerotic type changes aortic bifurcation and and iliac arteries with mild ectasia of the right iliac artery   Original Report Authenticated By: Lacy Duverney, M.D.   All radiology studies independently viewed by me.       1. Bowel obstruction       MDM   52 yo male with one day history of abdominal pain and vomiting.  Evaluated yesterday and found to have right sided broken ribs from a fall a few days ago.  Abdomen diffusely tender, more so periumbilical.  Not passing flatus.  Last normal BM yesterday.  Plan to CT abdomen given recent trauma.  However, suspect ileus from narcotic pain medication.    CT concerning for bowel obstruction.  Discussed with surgery and admitted to medicine.    Rennis Petty, MD 03/11/13 [phone]

## 2013-03-10 NOTE — ED Provider Notes (Signed)
  Medical screening examination/treatment/procedure(s) were performed by non-physician practitioner and as supervising physician I was immediately available for consultation/collaboration.    Gerhard Munch, MD 03/10/13 [phone]

## 2013-03-10 NOTE — H&P (Signed)
[facility] History and Physical  Frederick [name] ZOX:[id number] DOB: Dec 22, 1960 DOA: 03/10/2013  Referring physician:  EDP PCP: Default, Provider, MD  Specialists:   Chief Complaint:   HPI: Frederick [name] is a 52 y.o. male who presents to the ED with complaints of nausea , vomiting and ABD pain since the AM.   He reports not being able to pass stools or flatus, and also not being able to hold down any foods or liquids.   He was evaluated in the ED and found to have an SBO on CT scan and was referred for admission.   The EDP contacted General Surgery On Call and discussed the case, but General Surgery was not consulted officially.   The patient has a history of ETOH dependence and his last drink was 1 day ago.      Review of Systems: The patient denies anorexia, fever, chills, headache, weight loss, vision loss, decreased hearing, hoarseness, chest pain, syncope, dyspnea on exertion, peripheral edema, balance deficits, hemoptysis, diarrhea, melena, hematemesis, hematochezia, severe indigestion/heartburn, hematuria, incontinence, muscle weakness, suspicious skin lesions, transient blindness, difficulty walking, depression, unusual weight change, abnormal bleeding, enlarged lymph nodes, angioedema, and breast masses.    Past Medical History  Diagnosis Date  . Arthritis   . Asthma   . EtOH dependence     drinks 2-3 40s a day  . Fibula fracture     Dr Dion Saucier 02-2011  . Hernia     surgery 01-17-13    Past Surgical History  Procedure Laterality Date  . Ankle surgery    . Inguinal hernia repair Right 01/19/2013    Procedure: HERNIA REPAIR INGUINAL ADULT;  Surgeon: [name], MD,FACS;  Location: [facility] OR;  Service: General;  Laterality: Right;  . Tibia im nail insertion Left 01/22/2013    Procedure: INTRAMEDULLARY (IM) NAIL TIBIAL;  Surgeon: [name], MD;  Location: [facility] OR;  Service: Orthopedics;  Laterality: Left;     Medications:  HOME MEDS: Prior to Admission medications    Medication Sig Start Date End Date Taking? Authorizing Provider  EPINEPHrine (EPIPEN) 0.3 mg/0.3 mL DEVI Inject 0.3 mLs (0.3 mg total) into the muscle as needed. 01/17/13  Yes Joya Gaskins, MD  methocarbamol (ROBAXIN) 500 MG tablet Take 1 tablet (500 mg total) by mouth 4 (four) times daily. 01/22/13  Yes [name], MD  naproxen (NAPROSYN) 500 MG tablet Take 1 tablet (500 mg total) by mouth 2 (two) times daily. 02/03/13  Yes Renne Crigler, PA-C  oxyCODONE-acetaminophen (PERCOCET/ROXICET) 5-325 MG per tablet Take 1 tablet by mouth every 6 (six) hours as needed for pain. 03/09/13  Yes Jimmye Norman, NP    Allergies:  No Known Allergies   Social History:   reports that he has been smoking Cigarettes.  He has been smoking about 1.00 pack per day. He does not have any smokeless tobacco history on file. He reports that he drinks about 8.4 ounces of alcohol per week. He does not use illicit drugs.   Family History: Family History  Problem Relation Age of Onset  . Diabetes Mother   . Diabetes Father   . Hypertension Father     Physical Exam:  GEN:  Pleasant 52 year old Well Nourished and well developed African American Male examined  and in no acute distress; cooperative with exam Filed Vitals:   03/10/13 1726 03/10/13 2254  BP: 176/93 135/75  Pulse: 77 86  Temp: 98.4 F (36.9 C) 98.5 F (36.9 C)  TempSrc: Oral Oral  Resp: 18 21  SpO2: 99% 99%   Blood pressure 135/75, pulse 86, temperature 98.5 F (36.9 C), temperature source Oral, resp. rate 21, SpO2 99.00%. PSYCH: He is alert and oriented x4; does not appear anxious does not appear depressed; affect is normal HEENT: Normocephalic and Atraumatic, Mucous membranes pink; PERRLA; EOM intact; Fundi:  Benign;  No scleral icterus, Nares: Patent, Oropharynx: Clear, Fair Dentition, Neck:  FROM, no cervical lymphadenopathy nor thyromegaly or carotid bruit; no JVD; Breasts:: Not examined CHEST WALL: No tenderness CHEST: Normal  respiration, clear to auscultation bilaterally HEART: Regular rate and rhythm; no murmurs rubs or gallops BACK: No kyphosis or scoliosis; no CVA tenderness ABDOMEN:  Decreased Bowel Sounds, soft mildly tender Diffusely, No Rebound No Guarding,  No organomegaly.    Rectal Exam: Not done EXTREMITIES: No bone or joint deformity; age-appropriate arthropathy of the hands and knees; no cyanosis, clubbing or edema; no ulcerations. Genitalia: not examined PULSES: 2+ and symmetric SKIN: Normal hydration no rash or ulceration CNS: Cranial nerves 2-12 grossly intact no focal neurologic deficit   Labs & Imaging Results for orders placed during the hospital encounter of 03/10/13 (from the past 48 hour(s))  CBC WITH DIFFERENTIAL     Status: Abnormal   Collection Time    03/10/13  6:03 PM      Result Value Range   WBC 6.8  4.0 - 10.5 K/uL   RBC 4.39  4.22 - 5.81 MIL/uL   Hemoglobin 14.2  13.0 - 17.0 g/dL   HCT 16.1  09.6 - 04.5 %   MCV 91.3  78.0 - 100.0 fL   MCH 32.3  26.0 - 34.0 pg   MCHC 35.4  30.0 - 36.0 g/dL   RDW 40.9  81.1 - 91.4 %   Platelets 204  150 - 400 K/uL   Neutrophils Relative 80 (*) 43 - 77 %   Neutro Abs 5.5  1.7 - 7.7 K/uL   Lymphocytes Relative 9 (*) 12 - 46 %   Lymphs Abs 0.6 (*) 0.7 - 4.0 K/uL   Monocytes Relative 10  3 - 12 %   Monocytes Absolute 0.7  0.1 - 1.0 K/uL   Eosinophils Relative 1  0 - 5 %   Eosinophils Absolute 0.1  0.0 - 0.7 K/uL   Basophils Relative 0  0 - 1 %   Basophils Absolute 0.0  0.0 - 0.1 K/uL  COMPREHENSIVE METABOLIC PANEL     Status: Abnormal   Collection Time    03/10/13  6:03 PM      Result Value Range   Sodium 134 (*) 135 - 145 mEq/L   Potassium 4.1  3.5 - 5.1 mEq/L   Chloride 95 (*) 96 - 112 mEq/L   CO2 30  19 - 32 mEq/L   Glucose, Bld 108 (*) 70 - 99 mg/dL   BUN 9  6 - 23 mg/dL   Creatinine, Ser 7.82  0.50 - 1.35 mg/dL   Calcium 9.9  8.4 - 95.6 mg/dL   Total Protein 7.6  6.0 - 8.3 g/dL   Albumin 3.6  3.5 - 5.2 g/dL   AST 15  0 - 37  U/L   ALT 8  0 - 53 U/L   Alkaline Phosphatase 124 (*) 39 - 117 U/L   Total Bilirubin 0.5  0.3 - 1.2 mg/dL   GFR calc non Af Amer >90  >90 mL/min   GFR calc Af Amer >90  >90 mL/min   Comment:  The eGFR has been calculated     using the CKD EPI equation.     This calculation has not been     validated in all clinical     situations.     eGFR's persistently     <90 mL/min signify     possible Chronic Kidney Disease.  LIPASE, BLOOD     Status: None   Collection Time    03/10/13  6:03 PM      Result Value Range   Lipase 12  11 - 59 U/L  URINALYSIS, ROUTINE W REFLEX MICROSCOPIC     Status: Abnormal   Collection Time    03/10/13  6:55 PM      Result Value Range   Color, Urine YELLOW  YELLOW   APPearance CLEAR  CLEAR   Specific Gravity, Urine 1.022  1.005 - 1.030   pH 8.0  5.0 - 8.0   Glucose, UA NEGATIVE  NEGATIVE mg/dL   Hgb urine dipstick NEGATIVE  NEGATIVE   Bilirubin Urine NEGATIVE  NEGATIVE   Ketones, ur 15 (*) NEGATIVE mg/dL   Protein, ur NEGATIVE  NEGATIVE mg/dL   Urobilinogen, UA 2.0 (*) 0.0 - 1.0 mg/dL   Nitrite NEGATIVE  NEGATIVE   Leukocytes, UA NEGATIVE  NEGATIVE   Comment: MICROSCOPIC NOT DONE ON URINES WITH NEGATIVE PROTEIN, BLOOD, LEUKOCYTES, NITRITE, OR GLUCOSE <1000 mg/dL.     Radiological Exams on Admission: Dg Ribs Unilateral W/chest Right  03/09/2013  *RADIOLOGY REPORT*  Clinical Data: Fall with right lower rib pain.  RIGHT RIBS AND CHEST - 3+ VIEW  Comparison: Chest x-ray on 03/22/2012  Findings: Frontal chest radiograph shows no evidence of pneumothorax, consolidation or pleural fluid.  Right-sided rib films show acute fractures involving the right seventh, eighth, ninth and tenth ribs.  Eighth and ninth ribs show mild displacement.  Seventh and tenth rib fractures are essentially nondisplaced.  There is healed irregularity involving the distal aspect of the sixth rib suggestive of an old fracture.  IMPRESSION: Acute fractures of the right seventh,  eighth, ninth and tenth ribs. Eighth and ninth rib fractures show mild displacement.  No associated pneumothorax or hemothorax.   Original Report Authenticated By: Irish Lack, M.D.    Ct Abdomen Pelvis W Contrast  03/10/2013  *RADIOLOGY REPORT*  Clinical Data: Nausea and vomiting for past day.  Alcohol dependence.  CT ABDOMEN AND PELVIS WITH CONTRAST  Technique:  Multidetector CT imaging of the abdomen and pelvis was performed following the standard protocol during bolus administration of intravenous contrast.  Contrast: OMNIPAQUE IOHEXOL 300 MG/ML  SOLN  Comparison: No comparison exam available for direct review.  There is a report from prior CT performed 03/22/2006.  Findings: Peripheral aspect of the posterior right middle lobe 1.6 cm mass.  Comment was not made upon this previously.  Malignancy is a possibility.  CT chest recommended for further delineation.  This can be performed without contrast as the patient recently receive contrast.  Markedly dilated fluid and contrast filled stomach.  Slightly dilated proximal small bowel loops.  Point of obstruction not identified.  There is a change in caliber with distal small bowel loops of normal size.  Underlying adhesion or abnormality not excluded although not detected on present exam.  No free intraperitoneal air or free fluid.  No abdominal wall hernia.  Fatty liver without focal hepatic lesion.  No worrisome splenic, pancreatic, renal or adrenal lesion.  No calcified gallstones.  No abdominal aortic aneurysm.  Atherosclerotic type changes aortic  bifurcation and and iliac arteries with mild ectasia of the right iliac artery  Noncontrast filled views of the urinary bladder unremarkable.  No bony destructive lesion.  No adenopathy.  IMPRESSION: Peripheral aspect of the posterior right middle lobe 1.6 cm mass. Comment was not made upon this previously.  Malignancy is a possibility.  CT chest recommended for further delineation.  This can be performed  without contrast as the patient recently receive contrast.  Markedly dilated fluid and contrast filled stomach.  Slightly dilated proximal small bowel loops.  Point of obstruction not identified.  There is a change in caliber with distal small bowel loops of normal size.  Underlying adhesion or abnormality not excluded although not detected on present exam.  No free intraperitoneal air or free fluid.  No abdominal wall hernia.  Atherosclerotic type changes aortic bifurcation and and iliac arteries with mild ectasia of the right iliac artery   Original Report Authenticated By: Lacy Duverney, M.D.     EKG: Independently reviewed.      Assessment/Plan Principal Problem:   SBO (small bowel obstruction) Active Problems:   Abdominal pain, diffuse   Nausea and vomiting   EtOH dependence   Closed fracture of left distal distal tibia    1.   SBO-  Bowel Rest: NPO,  IVFs, Anti-Emetics PRN,  If persistent N+V may need to have NGT placement.    2.   ABD Pain-   Due to #1,  Pain Control PRN with IV Dilaudid.    3.   Nausea and Vomiting due to #1- Anti-Emetics PRN, NGT to LIMS only if needed.    4.   ETOH Dependence-  CIWA Protocol with IV Ativan.      5.   Closed Fracture of left Distal Tib-fib- stable, Followed by Ortho: Dr. Dion Saucier.  Soon to complete 9 weeks in cast.      6.   Other- DVT prophylaxis with Lovenox.        Code Status:    FULL CODE Family Communication:    No Family Present Disposition Plan:  Return to Home  Time spent:  47 Minutes  Ron Parker [facility] Pager [phone]  If 7PM-7AM, please contact night-coverage [email or web address] Password [facility] 03/10/2013, 11:26 PM

## 2013-03-10 NOTE — ED Notes (Signed)
Pt presents to department for evaluation of lower abdominal pain and vomiting. Onset this morning. Also states recent constipation. Has been taking pain medications for rib fractures. 8/10 pain at the time. Pt is alert and oriented x4.

## 2013-03-11 ENCOUNTER — Encounter (HOSPITAL_COMMUNITY): Payer: Self-pay | Admitting: *Deleted

## 2013-03-11 ENCOUNTER — Inpatient Hospital Stay (HOSPITAL_COMMUNITY): Payer: Self-pay

## 2013-03-11 DIAGNOSIS — R109 Unspecified abdominal pain: Secondary | ICD-10-CM

## 2013-03-11 DIAGNOSIS — F1020 Alcohol dependence, uncomplicated: Secondary | ICD-10-CM

## 2013-03-11 DIAGNOSIS — R112 Nausea with vomiting, unspecified: Secondary | ICD-10-CM

## 2013-03-11 LAB — BASIC METABOLIC PANEL
CO2: 28 mEq/L (ref 19–32)
Calcium: 9 mg/dL (ref 8.4–10.5)
Creatinine, Ser: 0.91 mg/dL (ref 0.50–1.35)
GFR calc non Af Amer: >90 mL/min (ref >90)
Glucose, Bld: 93 mg/dL (ref 70–99)

## 2013-03-11 LAB — CBC
MCH: 31.6 pg (ref 26.0–34.0)
MCV: 90.8 fL (ref 78.0–100.0)
Platelets: 206 10*3/uL (ref 150–400)
RDW: 13.9 % (ref 11.5–15.5)
WBC: 6.6 10*3/uL (ref 4.0–10.5)

## 2013-03-11 MED ORDER — POTASSIUM CHLORIDE 10 MEQ/100ML IV SOLN
10.0000 meq | INTRAVENOUS | Status: AC
  Administered 2013-03-11 (×4): 10 meq via INTRAVENOUS
  Filled 2013-03-11 (×5): qty 100

## 2013-03-11 MED ORDER — IOHEXOL 300 MG/ML  SOLN
80.0000 mL | Freq: Once | INTRAMUSCULAR | Status: AC | PRN
  Administered 2013-03-11: 80 mL via INTRAVENOUS

## 2013-03-11 NOTE — ED Provider Notes (Signed)
I saw and evaluated the patient, reviewed the resident's note and I agree with the findings and plan.  Pertinent History: Abd pain and vomiting with mild distention Pertinent Exam findings: distended abdomen, clear heart and lung sounds, NAD but appears uncomfortable  Imaging confirms SBO, admitted to medical service.   I personally interpreted the EKG as well as the resident and agree with the interpretation on the resident's chart.   Vida Roller, MD 03/11/13 [phone]

## 2013-03-11 NOTE — Progress Notes (Signed)
[facility] PROGRESS NOTE  Assessment/Plan: SBO (small bowel obstruction) - Ct abdomen 4.5.2014 as below. - bowel rest, antiemetics, IV fluids. - abdominal pain improved, passing flatus no BM. - cont NPO. - CT bad showed a possible 1.6 cm, will check a ct chest.  Nausea and vomiting - resolved. - due to SBO.  EtOH dependence - monitoring with CIWA.    Code Status: full Family Communication: none  Disposition Plan: inpatient   Consultants:  noen  Procedures:  none  Antibiotics:  None  HPI/Subjective: Abdominal pain improved. Passing flatus  Objective: Filed Vitals:   03/10/13 1726 03/10/13 2254 03/10/13 2354 03/11/13 0427  BP: 176/93 135/75 150/80 138/80  Pulse: 77 86 95 70  Temp: 98.4 F (36.9 C) 98.5 F (36.9 C) 98.4 F (36.9 C) 98.4 F (36.9 C)  TempSrc: Oral Oral Oral Oral  Resp: 18 21 17 17   Height:   6' 1.2" (1.859 m)   Weight:   77.656 kg (171 lb 3.2 oz)   SpO2: 99% 99% 94% 96%    Intake/Output Summary (Last 24 hours) at 03/11/13 0819 Last data filed at 03/11/13 0429  Gross per 24 hour  Intake      3 ml  Output    250 ml  Net   -247 ml   Filed Weights   03/10/13 2354  Weight: 77.656 kg (171 lb 3.2 oz)    Exam:  General: Alert, awake, oriented x3, in no acute distress.  HEENT: No bruits, no goiter.  Heart: Regular rate and rhythm, without murmurs, rubs, gallops.  Lungs: Good air movement, bilateral air movement.  Abdomen: Soft,mild tenderness on righ side, nondistended, positive bowel sounds.  Neuro: Grossly intact, nonfocal.   Data Reviewed: Basic Metabolic Panel:  Recent Labs Lab 03/10/13 1803 03/11/13 0500  NA 134* 134*  K 4.1 3.4*  CL 95* 99  CO2 30 28  GLUCOSE 108* 93  BUN 9 7  CREATININE 0.83 0.91  CALCIUM 9.9 9.0   Liver Function Tests:  Recent Labs Lab 03/10/13 1803  AST 15  ALT 8  ALKPHOS 124*  BILITOT 0.5  PROT 7.6  ALBUMIN 3.6    Recent Labs Lab 03/10/13 1803  LIPASE 12   No results  found for this basename: AMMONIA,  in the last 168 hours CBC:  Recent Labs Lab 03/10/13 1803 03/11/13 0500  WBC 6.8 6.6  NEUTROABS 5.5  --   HGB 14.2 13.0  HCT 40.1 37.3*  MCV 91.3 90.8  PLT 204 206   Cardiac Enzymes: No results found for this basename: CKTOTAL, CKMB, CKMBINDEX, TROPONINI,  in the last 168 hours BNP (last 3 results) No results found for this basename: PROBNP,  in the last 8760 hours CBG: No results found for this basename: GLUCAP,  in the last 168 hours  No results found for this or any previous visit (from the past 240 hour(s)).   Studies: Dg Ribs Unilateral W/chest Right  03/09/2013  *RADIOLOGY REPORT*  Clinical Data: Fall with right lower rib pain.  RIGHT RIBS AND CHEST - 3+ VIEW  Comparison: Chest x-ray on 03/22/2012  Findings: Frontal chest radiograph shows no evidence of pneumothorax, consolidation or pleural fluid.  Right-sided rib films show acute fractures involving the right seventh, eighth, ninth and tenth ribs.  Eighth and ninth ribs show mild displacement.  Seventh and tenth rib fractures are essentially nondisplaced.  There is healed irregularity involving the distal aspect of the sixth rib suggestive of an old fracture.  IMPRESSION: Acute  fractures of the right seventh, eighth, ninth and tenth ribs. Eighth and ninth rib fractures show mild displacement.  No associated pneumothorax or hemothorax.   Original Report Authenticated By: Irish Lack, M.D.    Ct Abdomen Pelvis W Contrast  03/10/2013  *RADIOLOGY REPORT*  Clinical Data: Nausea and vomiting for past day.  Alcohol dependence.  CT ABDOMEN AND PELVIS WITH CONTRAST  Technique:  Multidetector CT imaging of the abdomen and pelvis was performed following the standard protocol during bolus administration of intravenous contrast.  Contrast: OMNIPAQUE IOHEXOL 300 MG/ML  SOLN  Comparison: No comparison exam available for direct review.  There is a report from prior CT performed 03/22/2006.  Findings:  Peripheral aspect of the posterior right middle lobe 1.6 cm mass.  Comment was not made upon this previously.  Malignancy is a possibility.  CT chest recommended for further delineation.  This can be performed without contrast as the patient recently receive contrast.  Markedly dilated fluid and contrast filled stomach.  Slightly dilated proximal small bowel loops.  Point of obstruction not identified.  There is a change in caliber with distal small bowel loops of normal size.  Underlying adhesion or abnormality not excluded although not detected on present exam.  No free intraperitoneal air or free fluid.  No abdominal wall hernia.  Fatty liver without focal hepatic lesion.  No worrisome splenic, pancreatic, renal or adrenal lesion.  No calcified gallstones.  No abdominal aortic aneurysm.  Atherosclerotic type changes aortic bifurcation and and iliac arteries with mild ectasia of the right iliac artery  Noncontrast filled views of the urinary bladder unremarkable.  No bony destructive lesion.  No adenopathy.  IMPRESSION: Peripheral aspect of the posterior right middle lobe 1.6 cm mass. Comment was not made upon this previously.  Malignancy is a possibility.  CT chest recommended for further delineation.  This can be performed without contrast as the patient recently receive contrast.  Markedly dilated fluid and contrast filled stomach.  Slightly dilated proximal small bowel loops.  Point of obstruction not identified.  There is a change in caliber with distal small bowel loops of normal size.  Underlying adhesion or abnormality not excluded although not detected on present exam.  No free intraperitoneal air or free fluid.  No abdominal wall hernia.  Atherosclerotic type changes aortic bifurcation and and iliac arteries with mild ectasia of the right iliac artery   Original Report Authenticated By: Lacy Duverney, M.D.     Scheduled Meds: . sodium chloride   Intravenous STAT  . enoxaparin (LOVENOX) injection  40  mg Subcutaneous Q24H  . folic acid  1 mg Oral Daily  . LORazepam  0-4 mg Intravenous Q6H   Followed by  . [START ON 03/13/2013] LORazepam  0-4 mg Intravenous Q12H  . multivitamin with minerals  1 tablet Oral Daily  . pantoprazole (PROTONIX) IV  40 mg Intravenous Q12H  . sodium chloride  3 mL Intravenous Q12H  . thiamine  100 mg Oral Daily   Or  . thiamine  100 mg Intravenous Daily   Continuous Infusions: . sodium chloride 100 mL/hr at 03/11/13 0009     Marinda Elk  [facility] Pager [phone]. If 8PM-8AM, please contact night-coverage at [email or web address], password [facility] 03/11/2013, 8:19 AM  LOS: 1 day
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
## 2013-03-12 MED ORDER — PANTOPRAZOLE SODIUM 40 MG PO TBEC
40.0000 mg | DELAYED_RELEASE_TABLET | Freq: Two times a day (BID) | ORAL | Status: DC
  Administered 2013-03-12 – 2013-03-13 (×2): 40 mg via ORAL
  Filled 2013-03-12: qty 1

## 2013-03-12 NOTE — Care Management Note (Signed)
    Page 1 of 1   03/13/2013     12:34:54 PM   CARE MANAGEMENT NOTE 03/13/2013  Patient:  [facility]   Account Number:  [id number]  Date Initiated:  03/12/2013  Documentation initiated by:  Letha Cape  Subjective/Objective Assessment:   dx sbo  admit- lives alone. pta indep.  Homeless.     Action/Plan:   Anticipated DC Date:  03/13/2013   Anticipated DC Plan:  HOME/SELF CARE  In-house referral  Clinical Social Worker      DC Planning Services  CM consult  Follow-up appt scheduled      Choice offered to / List presented to:             Status of service:  Completed, signed off Medicare Important Message given?   (If response is "NO", the following Medicare IM given date fields will be blank) Date Medicare IM given:   Date Additional Medicare IM given:    Discharge Disposition:  HOME/SELF CARE  Per UR Regulation:  Reviewed for med. necessity/level of care/duration of stay  If discussed at Long Length of Stay Meetings, dates discussed:    Comments:  03/13/13 12:19 Letha Cape RN, BSN [phone] patient is for dc today, he states he will need a bus pass, CSW aware.  The only medications pt will be on is the ones he takes at home and he has these already. Informed him that I could not help him with the narcotics.  Patient also states he needs a shelter because he is homeless, CSW spoke to patient about this in his room.  Patient asked CSW, what if he does not have the $ 20, he still needs to go to the appt and they will bill him.  03/12/13 16:09 Letha Cape RN, BSN [phone] patient lives alone, pta indep.  Patient will need hospital follow up at urgent care, may need ast with medications, NCM will continue to follow for dc needs.

## 2013-03-12 NOTE — Progress Notes (Addendum)
[facility] PROGRESS NOTE  Assessment/Plan: SBO (small bowel obstruction) - Ct abdomen 4.5.2014 as below. - bowel rest, antiemetics, IV fluids. - abdominal pain improved, passing flatus no BM. - Hungry start clear liq diet. - Ct chest as below.  Nausea and vomiting - resolved. - due to SBO.  EtOH dependence - monitoring with [facility].    Code Status: full Family Communication: none  Disposition Plan: inpatient   Consultants:  none  Procedures: CT chest 4.6.2014 : Bilateral, multifocal areas of architectural distortion, bronchiectasis and tree in bud nodularity. 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  Peripheral nodular opacity within the right middle   Ct abd 4.6.2014: Markedly dilated fluid and contrast filled stomach. Slightly dilated proximal small bowel loops. Point of obstruction not identified. There is a change in caliber with distal small bowel loops of normal size   Antibiotics:  None  HPI/Subjective: Abdominal pain improved. Passing flatus  Objective: Filed Vitals:   03/11/13 2153 03/12/13 0000 03/12/13 0236 03/12/13 0604  BP: 159/80 136/72 151/88 144/82  Pulse: 63 80 66 75  Temp: 98.3 F (36.8 C) 98.1 F (36.7 C) 98.1 F (36.7 C) 98 F (36.7 C)  TempSrc: Oral Oral Oral Oral  Resp: 18 17 17 18   Height:      Weight:      SpO2: 95% 99% 100% 95%    Intake/Output Summary (Last 24 hours) at 03/12/13 1006 Last data filed at 03/12/13 0959  Gross per 24 hour  Intake    120 ml  Output   2100 ml  Net  -1980 ml   Filed Weights   03/10/13 2354  Weight: 77.656 kg (171 lb 3.2 oz)    Exam:  General: Alert, awake, oriented x3, in no acute distress.  HEENT: No bruits, no goiter.  Heart: Regular rate and rhythm, without murmurs, rubs, gallops.  Lungs: Good air movement, bilateral air movement.  Abdomen: Soft,mild tenderness on righ side, nondistended, positive bowel sounds.   Neuro: Grossly intact, nonfocal.   Data Reviewed: Basic Metabolic Panel:  Recent Labs Lab 03/10/13 1803 03/11/13 0500  NA 134* 134*  K 4.1 3.4*  CL 95* 99  CO2 30 28  GLUCOSE 108* 93  BUN 9 7  CREATININE 0.83 0.91  CALCIUM 9.9 9.0   Liver Function Tests:  Recent Labs Lab 03/10/13 1803  AST 15  ALT 8  ALKPHOS 124*  BILITOT 0.5  PROT 7.6  ALBUMIN 3.6    Recent Labs Lab 03/10/13 1803  LIPASE 12   No results found for this basename: AMMONIA,  in the last 168 hours CBC:  Recent Labs Lab 03/10/13 1803 03/11/13 0500  WBC 6.8 6.6  NEUTROABS 5.5  --   HGB 14.2 13.0  HCT 40.1 37.3*  MCV 91.3 90.8  PLT 204 206   Cardiac Enzymes: No results found for this basename: CKTOTAL, CKMB, CKMBINDEX, TROPONINI,  in the last 168 hours BNP (last 3 results) No results found for this basename: PROBNP,  in the last 8760 hours CBG: No results found for this basename: GLUCAP,  in the last 168 hours  No results found for this or any previous visit (from the past 240 hour(s)).   Studies: Ct Chest W Contrast  03/11/2013  *RADIOLOGY REPORT*  Clinical Data: Evaluate lung mass identified on recent chest radiograph  CT CHEST WITH CONTRAST  Technique:  Multidetector CT imaging of the chest was performed following the standard protocol during bolus  administration of intravenous contrast.  Contrast: 80mL OMNIPAQUE IOHEXOL 300 MG/ML  SOLN  Comparison: 03/22/2012  Findings: There are small bilateral pleural effusions.  Area of architectural distortion, bronchiectasis and peripheral tree in bud nodularity is identified within the right middle lobe, image number 35/series 3.  Associated bronchial wall thickening is identified.  Associated with this is a peripheral nodule which measures 1.6 x 1.4 cm, image 39/series 3.  This is new when compared with the previous examination.  Several calcified granulomas are clustered in the right upper lobe.  Within the basilar portion of the right upper lobe  there is no additional area of clustered tree in bud nodularity and bronchiectasis, image 24/series 3.  Within the superior segment of the left upper lobe there is a focal area of architectural distortion and bronchiectasis, image number 31/series 3.  There is no mediastinal or hilar adenopathy.  The heart size is normal.  No pericardial effusion.  No axillary or supraclavicular adenopathy.  There are mildly displaced age indeterminant right anterior rib fractures.  Several chronic right posterior rib fractures are also noted.  IMPRESSION:  1.  No acute cardiopulmonary abnormalities. 2.  Bilateral, multifocal areas of architectural distortion, bronchiectasis and tree in bud nodularity.  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 3.  Peripheral nodular opacity within the right middle lobe is identified as seen on 03/10/2013.  This is new from 03/22/2012. Although this is most likely associated with chronic indolent infection this does not have the typical tree in bud appearance.  I would advise initial workup with short-term follow-up imaging in 3 months.  If this persists or has increased in size then consider biopsy or PET CT.   Original Report Authenticated By: Signa Kell, M.D.    Ct Abdomen Pelvis W Contrast  03/10/2013  *RADIOLOGY REPORT*  Clinical Data: Nausea and vomiting for past day.  Alcohol dependence.  CT ABDOMEN AND PELVIS WITH CONTRAST  Technique:  Multidetector CT imaging of the abdomen and pelvis was performed following the standard protocol during bolus administration of intravenous contrast.  Contrast: OMNIPAQUE IOHEXOL 300 MG/ML  SOLN  Comparison: No comparison exam available for direct review.  There is a report from prior CT performed 03/22/2006.  Findings: Peripheral aspect of the posterior right middle lobe 1.6 cm mass.  Comment was not made upon this previously.  Malignancy is a possibility.  CT chest recommended for  further delineation.  This can be performed without contrast as the patient recently receive contrast.  Markedly dilated fluid and contrast filled stomach.  Slightly dilated proximal small bowel loops.  Point of obstruction not identified.  There is a change in caliber with distal small bowel loops of normal size.  Underlying adhesion or abnormality not excluded although not detected on present exam.  No free intraperitoneal air or free fluid.  No abdominal wall hernia.  Fatty liver without focal hepatic lesion.  No worrisome splenic, pancreatic, renal or adrenal lesion.  No calcified gallstones.  No abdominal aortic aneurysm.  Atherosclerotic type changes aortic bifurcation and and iliac arteries with mild ectasia of the right iliac artery  Noncontrast filled views of the urinary bladder unremarkable.  No bony destructive lesion.  No adenopathy.  IMPRESSION: Peripheral aspect of the posterior right middle lobe 1.6 cm mass. Comment was not made upon this previously.  Malignancy is a possibility.  CT chest recommended for further delineation.  This can be performed without contrast as the  patient recently receive contrast.  Markedly dilated fluid and contrast filled stomach.  Slightly dilated proximal small bowel loops.  Point of obstruction not identified.  There is a change in caliber with distal small bowel loops of normal size.  Underlying adhesion or abnormality not excluded although not detected on present exam.  No free intraperitoneal air or free fluid.  No abdominal wall hernia.  Atherosclerotic type changes aortic bifurcation and and iliac arteries with mild ectasia of the right iliac artery   Original Report Authenticated By: Lacy Duverney, M.D.     Scheduled Meds: . enoxaparin (LOVENOX) injection  40 mg Subcutaneous Q24H  . folic acid  1 mg Oral Daily  . LORazepam  0-4 mg Intravenous Q6H   Followed by  . [START ON 03/13/2013] LORazepam  0-4 mg Intravenous Q12H  . multivitamin with minerals  1 tablet  Oral Daily  . pantoprazole (PROTONIX) IV  40 mg Intravenous Q12H  . sodium chloride  3 mL Intravenous Q12H  . thiamine  100 mg Oral Daily   Or  . thiamine  100 mg Intravenous Daily   Continuous Infusions: . sodium chloride 100 mL/hr at 03/12/13 0609     Marinda Elk  [facility] Pager [phone]. If 8PM-8AM, please contact night-coverage at [email or web address], password [facility] 03/12/2013, 10:06 AM  LOS: 2 days

## 2013-03-13 ENCOUNTER — Encounter (INDEPENDENT_AMBULATORY_CARE_PROVIDER_SITE_OTHER): Payer: Self-pay | Admitting: General Surgery

## 2013-03-13 DIAGNOSIS — K56609 Unspecified intestinal obstruction, unspecified as to partial versus complete obstruction: Secondary | ICD-10-CM

## 2013-03-13 DIAGNOSIS — F1020 Alcohol dependence, uncomplicated: Secondary | ICD-10-CM

## 2013-03-13 MED ORDER — POTASSIUM CHLORIDE CRYS ER 20 MEQ PO TBCR
40.0000 meq | EXTENDED_RELEASE_TABLET | Freq: Two times a day (BID) | ORAL | Status: DC
  Administered 2013-03-13: 40 meq via ORAL
  Filled 2013-03-13: qty 2

## 2013-03-13 NOTE — Progress Notes (Signed)
NURSING PROGRESS NOTE  Frederick [name] [id number] Discharge Data: 03/13/2013 4:18 PM Attending Provider: Marinda Elk, MD WUJ:[id number], Provider, MD     [name] to be D/C'd Home  per MD order.  Discussed with the patient the After Visit Summary and all questions fully answered. All IV's discontinued with no bleeding noted. All belongings returned to patient for patient to take home.   Last Vital Signs:  Blood pressure 119/76, pulse 81, temperature 98.6 F (37 C), temperature source Oral, resp. rate 18, height 6' 1.2" (1.859 m), weight 77.656 kg (171 lb 3.2 oz), SpO2 100.00%.  Discharge Medication List   Medication List    TAKE these medications       EPINEPHrine 0.3 mg/0.3 mL Devi  Commonly known as:  EPIPEN  Inject 0.3 mLs (0.3 mg total) into the muscle as needed.     methocarbamol 500 MG tablet  Commonly known as:  ROBAXIN  Take 1 tablet (500 mg total) by mouth 4 (four) times daily.     naproxen 500 MG tablet  Commonly known as:  NAPROSYN  Take 1 tablet (500 mg total) by mouth 2 (two) times daily.     oxyCODONE-acetaminophen 5-325 MG per tablet  Commonly known as:  PERCOCET/ROXICET  Take 1 tablet by mouth every 6 (six) hours as needed for pain.         Madelin Rear RN, MSN, [employer]

## 2013-03-13 NOTE — Discharge Summary (Signed)
Physician Discharge Summary  Frederick [name]:[id number] DOB: May 07, 1961 DOA: 03/10/2013  PCP: Default, Provider, MD  Admit date: 03/10/2013 Discharge date: 03/13/2013  Time spent: 30 minutes  Recommendations for Outpatient Follow-up:  1. Follow up with urgent care 2. Repeat ct chest in 3 wmonths  Discharge Diagnoses:  Principal Problem:   SBO (small bowel obstruction) Active Problems:   EtOH dependence   Abdominal pain, diffuse   Nausea and vomiting   Discharge Condition: stable  Diet recommendation: regular  Filed Weights   03/10/13 2354  Weight: 77.656 kg (171 lb 3.2 oz)    History of present illness:  52 y.o. male who presents to the ED with complaints of nausea , vomiting and ABD pain since the AM. He reports not being able to pass stools or flatus, and also not being able to hold down any foods or liquids. He was evaluated in the ED and found to have an SBO on CT scan and was referred for admission. The EDP contacted [facility] and discussed the case, but General Surgery was not consulted officially. The patient has a history of ETOH dependence and his last drink was 1 day ago.    Hospital Course:  Nausea and vomiting  Due toSBO (small bowel obstruction)  - Ct abdomen 4.5.2014 as below.  - bowel rest, antiemetics, IV fluids.  - abdominal pain improved, passing flatus no BM.  - had tap water enema. - diet advance tolerate it - Ct chest as below.    EtOH dependence  - monitoring with [facility] - no events  Procedures:  Ct chest   Ct abdomen  Consultations:  none  Discharge Exam: Filed Vitals:   03/12/13 1459 03/12/13 2105 03/13/13 0552 03/13/13 1008  BP: 150/89 122/68 125/83 130/89  Pulse: 65 77 69 76  Temp: 98.1 F (36.7 C) 98.4 F (36.9 C) 98.3 F (36.8 C) 97.9 F (36.6 C)  TempSrc: Oral Oral Oral Oral  Resp: 19 18 18 18   Height:      Weight:      SpO2: 95% 100% 93% 96%    General: A&O x3 Cardiovascular: RRR Respiratory: good air  movement CTA B/L  Discharge Instructions  Discharge Orders   Future Orders Complete By Expires     Diet - low sodium heart healthy  As directed     Increase activity slowly  As directed         Medication List    TAKE these medications       EPINEPHrine 0.3 mg/0.3 mL Devi  Commonly known as:  EPIPEN  Inject 0.3 mLs (0.3 mg total) into the muscle as needed.     methocarbamol 500 MG tablet  Commonly known as:  ROBAXIN  Take 1 tablet (500 mg total) by mouth 4 (four) times daily.     naproxen 500 MG tablet  Commonly known as:  NAPROSYN  Take 1 tablet (500 mg total) by mouth 2 (two) times daily.     oxyCODONE-acetaminophen 5-325 MG per tablet  Commonly known as:  PERCOCET/ROXICET  Take 1 tablet by mouth every 6 (six) hours as needed for pain.           Follow-up Information   Follow up with Default, Provider, MD. (CT chest in 3 mmonths)    Contact information:   [address] [place] [address] [phone]       Follow up with [facility] On 03/20/2013. (11 am, please bring $  20 copay, photo id and medications)    Contact information:   [address] [place] [address] [phone]       The results of significant diagnostics from this hospitalization (including imaging, microbiology, ancillary and laboratory) are listed below for reference.    Significant Diagnostic Studies: Dg Ribs Unilateral W/chest Right  03/09/2013  *RADIOLOGY REPORT*  Clinical Data: Fall with right lower rib pain.  RIGHT RIBS AND CHEST - 3+ VIEW  Comparison: Chest x-ray on 03/22/2012  Findings: Frontal chest radiograph shows no evidence of pneumothorax, consolidation or pleural fluid.  Right-sided rib films show acute fractures involving the right seventh, eighth, ninth and tenth ribs.  Eighth and ninth ribs show mild displacement.  Seventh and tenth rib fractures are essentially nondisplaced.  There is healed irregularity involving the distal aspect  of the sixth rib suggestive of an old fracture.  IMPRESSION: Acute fractures of the right seventh, eighth, ninth and tenth ribs. Eighth and ninth rib fractures show mild displacement.  No associated pneumothorax or hemothorax.   Original Report Authenticated By: Irish Lack, M.D.    Ct Chest W Contrast  03/11/2013  *RADIOLOGY REPORT*  Clinical Data: Evaluate lung mass identified on recent chest radiograph  CT CHEST WITH CONTRAST  Technique:  Multidetector CT imaging of the chest was performed following the standard protocol during bolus administration of intravenous contrast.  Contrast: 80mL OMNIPAQUE IOHEXOL 300 MG/ML  SOLN  Comparison: 03/22/2012  Findings: There are small bilateral pleural effusions.  Area of architectural distortion, bronchiectasis and peripheral tree in bud nodularity is identified within the right middle lobe, image number 35/series 3.  Associated bronchial wall thickening is identified.  Associated with this is a peripheral nodule which measures 1.6 x 1.4 cm, image 39/series 3.  This is new when compared with the previous examination.  Several calcified granulomas are clustered in the right upper lobe.  Within the basilar portion of the right upper lobe there is no additional area of clustered tree in bud nodularity and bronchiectasis, image 24/series 3.  Within the superior segment of the left upper lobe there is a focal area of architectural distortion and bronchiectasis, image number 31/series 3.  There is no mediastinal or hilar adenopathy.  The heart size is normal.  No pericardial effusion.  No axillary or supraclavicular adenopathy.  There are mildly displaced age indeterminant right anterior rib fractures.  Several chronic right posterior rib fractures are also noted.  IMPRESSION:  1.  No acute cardiopulmonary abnormalities. 2.  Bilateral, multifocal areas of architectural distortion, bronchiectasis and tree in bud nodularity.  This is most likely the sequela of chronic indolent  atypical infection such as Mycobacterium avium complex. 3.  Peripheral nodular opacity within the right middle lobe is identified as seen on 03/10/2013.  This is new from 03/22/2012. Although this is most likely associated with chronic indolent infection this does not have the typical tree in bud appearance.  I would advise initial workup with short-term follow-up imaging in 3 months.  If this persists or has increased in size then consider biopsy or PET CT.   Original Report Authenticated By: Signa Kell, M.D.    Ct Abdomen Pelvis W Contrast  03/10/2013  *RADIOLOGY REPORT*  Clinical Data: Nausea and vomiting for past day.  Alcohol dependence.  CT ABDOMEN AND PELVIS WITH CONTRAST  Technique:  Multidetector CT imaging of the abdomen and pelvis was performed following the standard protocol during bolus administration of intravenous contrast.  Contrast: OMNIPAQUE IOHEXOL 300  MG/ML  SOLN  Comparison: No comparison exam available for direct review.  There is a report from prior CT performed 03/22/2006.  Findings: Peripheral aspect of the posterior right middle lobe 1.6 cm mass.  Comment was not made upon this previously.  Malignancy is a possibility.  CT chest recommended for further delineation.  This can be performed without contrast as the patient recently receive contrast.  Markedly dilated fluid and contrast filled stomach.  Slightly dilated proximal small bowel loops.  Point of obstruction not identified.  There is a change in caliber with distal small bowel loops of normal size.  Underlying adhesion or abnormality not excluded although not detected on present exam.  No free intraperitoneal air or free fluid.  No abdominal wall hernia.  Fatty liver without focal hepatic lesion.  No worrisome splenic, pancreatic, renal or adrenal lesion.  No calcified gallstones.  No abdominal aortic aneurysm.  Atherosclerotic type changes aortic bifurcation and and iliac arteries with mild ectasia of the right iliac  artery  Noncontrast filled views of the urinary bladder unremarkable.  No bony destructive lesion.  No adenopathy.  IMPRESSION: Peripheral aspect of the posterior right middle lobe 1.6 cm mass. Comment was not made upon this previously.  Malignancy is a possibility.  CT chest recommended for further delineation.  This can be performed without contrast as the patient recently receive contrast.  Markedly dilated fluid and contrast filled stomach.  Slightly dilated proximal small bowel loops.  Point of obstruction not identified.  There is a change in caliber with distal small bowel loops of normal size.  Underlying adhesion or abnormality not excluded although not detected on present exam.  No free intraperitoneal air or free fluid.  No abdominal wall hernia.  Atherosclerotic type changes aortic bifurcation and and iliac arteries with mild ectasia of the right iliac artery   Original Report Authenticated By: Lacy Duverney, M.D.     Microbiology: No results found for this or any previous visit (from the past 240 hour(s)).   Labs: Basic Metabolic Panel:  Recent Labs Lab 03/10/13 1803 03/11/13 0500  NA 134* 134*  K 4.1 3.4*  CL 95* 99  CO2 30 28  GLUCOSE 108* 93  BUN 9 7  CREATININE 0.83 0.91  CALCIUM 9.9 9.0   Liver Function Tests:  Recent Labs Lab 03/10/13 1803  AST 15  ALT 8  ALKPHOS 124*  BILITOT 0.5  PROT 7.6  ALBUMIN 3.6    Recent Labs Lab 03/10/13 1803  LIPASE 12   No results found for this basename: AMMONIA,  in the last 168 hours CBC:  Recent Labs Lab 03/10/13 1803 03/11/13 0500  WBC 6.8 6.6  NEUTROABS 5.5  --   HGB 14.2 13.0  HCT 40.1 37.3*  MCV 91.3 90.8  PLT 204 206   Cardiac Enzymes: No results found for this basename: CKTOTAL, CKMB, CKMBINDEX, TROPONINI,  in the last 168 hours BNP: BNP (last 3 results) No results found for this basename: PROBNP,  in the last 8760 hours CBG: No results found for this basename: GLUCAP,  in the last 168  hours     Signed:  Marinda Elk  [facility] 03/13/2013, 10:49 AM

## 2013-03-13 NOTE — Clinical Social Work Psychosocial (Signed)
     Clinical Social Work Department BRIEF PSYCHOSOCIAL ASSESSMENT 03/13/2013  Patient:  [facility]     Account Number:  [id number]     Admit date:  03/10/2013  Clinical Social Worker:  Hulan Fray  Date/Time:  03/13/2013 01:51 PM  Referred by:  Care Management  Date Referred:  03/13/2013 Referred for  Homelessness   Other Referral:   Interview type:  Patient Other interview type:    PSYCHOSOCIAL DATA Living Status:  ALONE Admitted from facility:   Level of care:   Primary support name:  Cleotis Lema Primary support relationship to patient:  FAMILY Degree of support available:   adequate    CURRENT CONCERNS Current Concerns  Other - See comment   Other Concerns:   Shelters    SOCIAL WORK ASSESSMENT / PLAN Clincial Social Worker received referral for patient needing resources for shelters in the area. CSW introduced self and explained reason for visit.     Patient reported that his dilemma began in February when he was in the hospital and his wife passed. Patient also explained that he broke his leg and is planned to get the cast off his leg in two days.    Patient began discussing that his main barrier is his disability that he has been trying to obtain and working with his lawyer, but has not received a call back from the lawyer. Once patient is able to obtain his disability, patient stated that he will be able to rent a studio apartment . Patient also reported that he is working on obtaining his marriage license from [place], [place] to prove his marriage.    CSW inquired about his step-son who by his report is living in the house the patient shared with his wife. Per patient, he feels the space is too "cramped" because step-son has his girlfriend and her child living in that house. Patient reported that he does not want to stay with his step-son.    CSW provided resources for homeless shelters, hot meals, [facility] and emergency assistance. Patient reported that  he already knew about the [facility] [place] and was in the facility from December 2013-Feb 2014 and used up all his days. Patient reported, "I already know how to survive in the streets." CSW inquired about plan at discharge and patient reported, "I'll find one of my friends or sleep in a car." CSW will provide bus pass that was requested. CSW will sign off, as social work intervention is no longer needed.   Assessment/plan status:  Information/Referral to [facility] Other assessment/ plan:   Information/referral to community resources:   [facility], List of homeless shelters, list of hot meals, and emergency financial assistance.    PATIENTS/FAMILYS RESPONSE TO PLAN OF CARE: Patient was not pleased with the resources provided by CSW as he reported that he already knew about the shelters in the county. Patient appeared to be waiting on his disability to come through before he can make proper plans for housing.

## 2013-03-14 ENCOUNTER — Emergency Department (HOSPITAL_COMMUNITY)
Admission: EM | Admit: 2013-03-14 | Discharge: 2013-03-14 | Disposition: A | Discharge status: Home / Self Care | Payer: Self-pay | Attending: Emergency Medicine | Admitting: Emergency Medicine

## 2013-03-14 ENCOUNTER — Encounter (HOSPITAL_COMMUNITY): Payer: Self-pay | Admitting: *Deleted

## 2013-03-14 ENCOUNTER — Emergency Department (HOSPITAL_COMMUNITY): Payer: Self-pay

## 2013-03-14 DIAGNOSIS — Z8719 Personal history of other diseases of the digestive system: Secondary | ICD-10-CM | POA: Insufficient documentation

## 2013-03-14 DIAGNOSIS — F17200 Nicotine dependence, unspecified, uncomplicated: Secondary | ICD-10-CM | POA: Insufficient documentation

## 2013-03-14 DIAGNOSIS — R918 Other nonspecific abnormal finding of lung field: Secondary | ICD-10-CM | POA: Insufficient documentation

## 2013-03-14 DIAGNOSIS — F1010 Alcohol abuse, uncomplicated: Secondary | ICD-10-CM | POA: Insufficient documentation

## 2013-03-14 DIAGNOSIS — Z79899 Other long term (current) drug therapy: Secondary | ICD-10-CM | POA: Insufficient documentation

## 2013-03-14 DIAGNOSIS — Z8739 Personal history of other diseases of the musculoskeletal system and connective tissue: Secondary | ICD-10-CM | POA: Insufficient documentation

## 2013-03-14 DIAGNOSIS — K5900 Constipation, unspecified: Secondary | ICD-10-CM | POA: Insufficient documentation

## 2013-03-14 DIAGNOSIS — J45909 Unspecified asthma, uncomplicated: Secondary | ICD-10-CM | POA: Insufficient documentation

## 2013-03-14 DIAGNOSIS — Z8781 Personal history of (healed) traumatic fracture: Secondary | ICD-10-CM | POA: Insufficient documentation

## 2013-03-14 MED ORDER — TRAMADOL HCL 50 MG PO TABS
50.0000 mg | ORAL_TABLET | Freq: Once | ORAL | Status: AC
  Administered 2013-03-14: 50 mg via ORAL
  Filled 2013-03-14: qty 1

## 2013-03-14 MED ORDER — AZITHROMYCIN 250 MG PO TABS: ORAL_TABLET | ORAL | Status: DC

## 2013-03-14 MED ORDER — DOCUSATE SODIUM 100 MG PO CAPS: 100.0000 mg | ORAL_CAPSULE | Freq: Two times a day (BID) | ORAL | Status: DC

## 2013-03-14 MED ORDER — POLYETHYLENE GLYCOL 3350 17 G PO PACK: 17.0000 g | PACK | Freq: Every day | ORAL | Status: DC

## 2013-03-14 NOTE — Progress Notes (Signed)
   CARE MANAGEMENT ED NOTE 03/14/2013  Patient:  [facility]   Account Number:  [id number]  Date Initiated:  03/14/2013  Documentation initiated by:  Fransico Michael  Subjective/Objective Assessment:   presented to ED with c/o constipation     Subjective/Objective Assessment Detail:     Action/Plan:   requesting assistance with medications   Action/Plan Detail:   Anticipated DC Date:  03/14/2013     Status Recommendation to Physician:   Result of Recommendation:      DC Planning Services  CM consult  MATCH Program    Choice offered to / List presented to:            Status of service:  Completed, signed off  ED Comments:   ED Comments Detail:  03/14/13-1034-J.Tarrance Januszewski,RN,BSN [phone]     Patient enrolled into [facility] program again for zithromax prescription. In to speak with patient. Explained in no uncertain terms that patient would not be allowed to utilize [facility] again until April of 2015. Voiced understanding. Also notified that patient would have a $3 copay. Voiced understanding. Patient to be discharged home today.  03/14/13-1003-J.Maeve Debord,RN,BSN [phone]     Spoke with Nicolasa Ducking regarding need and use of MATCH program in February. Due to prescription for zithromax for infiltrate and high likelyhood of return for care without abx therapy. Decision made to access [facility] program again.  03/14/13-0945-J.Avory Rahimi,RN,BSN [phone]      Received request from [facility] [place] ED to assist patient with medications. In to see patient. Voiced inability to pay for prescriptions. Notified patient that colace and mirilax are OTC. Assessed for elligibility in [facility] program. Noted that paitient had received assistance from [facility] for epi pen in February of 2014.

## 2013-03-14 NOTE — ED Notes (Signed)
Pt getting into a gown at this time

## 2013-03-14 NOTE — ED Provider Notes (Signed)
History     CSN: [id number]  Arrival date & time 03/14/13  7829   First MD Initiated Contact with Patient 03/14/13 [phone]      Chief Complaint  Patient presents with  . Constipation    (Consider location/radiation/quality/duration/timing/severity/associated sxs/prior treatment) HPI Pt admitted 4/5 and discharged 4/8 for SBO. Pt states he continues to pass flatus but has not had BM. No N/V abd pain, distention. No fever chills. Tolerating PO's Past Medical History  Diagnosis Date  . Arthritis   . Asthma   . EtOH dependence     drinks 2-3 40s a day  . Fibula fracture     Dr Dion Saucier 02-2011  . Hernia     surgery 01-17-13    Past Surgical History  Procedure Laterality Date  . Ankle surgery    . Inguinal hernia repair Right 01/19/2013    Procedure: HERNIA REPAIR INGUINAL ADULT;  Surgeon: [name], MD,FACS;  Location: [facility] OR;  Service: General;  Laterality: Right;  . Tibia im nail insertion Left 01/22/2013    Procedure: INTRAMEDULLARY (IM) NAIL TIBIAL;  Surgeon: [name], MD;  Location: [facility] OR;  Service: Orthopedics;  Laterality: Left;    Family History  Problem Relation Age of Onset  . Diabetes Mother   . Diabetes Father   . Hypertension Father     History  Substance Use Topics  . Smoking status: Current Every Day Smoker -- 1.00 packs/day    Types: Cigarettes  . Smokeless tobacco: Not on file     Comment: 1 ppd  . Alcohol Use: 8.4 oz/week    14 Cans of beer per week     Comment: occasionally; generally drinks (2) 40s/day      Review of Systems  Constitutional: Negative for fever and chills.  Cardiovascular: Negative for chest pain.  Gastrointestinal: Positive for constipation. Negative for nausea, vomiting, abdominal pain, diarrhea and abdominal distention.  All other systems reviewed and are negative.    Allergies  Review of patient's allergies indicates no known allergies.  Home Medications   Current Outpatient Rx  Name  Route  Sig  Dispense   Refill  . methocarbamol (ROBAXIN) 500 MG tablet   Oral   Take 1 tablet (500 mg total) by mouth 4 (four) times daily.   75 tablet   1   . naproxen (NAPROSYN) 500 MG tablet   Oral   Take 1 tablet (500 mg total) by mouth 2 (two) times daily.   20 tablet   0   . oxyCODONE-acetaminophen (PERCOCET/ROXICET) 5-325 MG per tablet   Oral   Take 1 tablet by mouth every 6 (six) hours as needed for pain.   15 tablet   0   . azithromycin (ZITHROMAX Z-PAK) 250 MG tablet      2 po day one, then 1 daily x 4 days   5 tablet   0   . docusate sodium (COLACE) 100 MG capsule   Oral   Take 1 capsule (100 mg total) by mouth every 12 (twelve) hours.   60 capsule   0   . EPINEPHrine (EPIPEN) 0.3 mg/0.3 mL DEVI   Intramuscular   Inject 0.3 mLs (0.3 mg total) into the muscle as needed.   2 Device   1   . polyethylene glycol (MIRALAX / GLYCOLAX) packet   Oral   Take 17 g by mouth daily.   14 each   0     BP 136/77  Pulse 78  Temp(Src) 98.6  F (37 C) (Oral)  Resp 18  Ht 6\' 1"  (1.854 m)  Wt 185 lb (83.915 kg)  BMI 24.41 kg/m2  SpO2 100%  Physical Exam  Nursing note and vitals reviewed. Constitutional: He is oriented to person, place, and time. He appears well-developed and well-nourished. No distress.  HENT:  Head: Normocephalic and atraumatic.  Mouth/Throat: Oropharynx is clear and moist.  Eyes: EOM are normal. Pupils are equal, round, and reactive to light.  Neck: Normal range of motion. Neck supple.  Cardiovascular: Normal rate and regular rhythm.   Pulmonary/Chest: Effort normal and breath sounds normal. No respiratory distress. He has no wheezes. He has no rales.  Abdominal: Soft. He exhibits no distension and no mass. There is no tenderness. There is no rebound and no guarding.  Hyperactive bowel sounds  Musculoskeletal: Normal range of motion. He exhibits no edema and no tenderness.  Neurological: He is alert and oriented to person, place, and time.  Skin: Skin is warm and  dry. No rash noted. No erythema.  Psychiatric: He has a normal mood and affect. His behavior is normal.    ED Course  Procedures (including critical care time)  Labs Reviewed - No data to display Dg Abd Acute W/chest  03/14/2013  *RADIOLOGY REPORT*  Clinical Data: Abdominal pain.  Evaluate for recurrent obstruction.  ACUTE ABDOMEN SERIES (ABDOMEN 2 VIEW & CHEST 1 VIEW)  Comparison: Chest x-ray 03/09/2013.  Abdominal CT scan 03/10/2013.  Findings: Patchy areas of ill-defined consolidation are noted in the periphery of the right upper lobe, compatible with areas of peribronchovascular micronodularity noted on recent chest CT.  No more confluent consolidative airspace disease.  No pleural effusions.  No pneumothorax.  No evidence of pulmonary edema. Heart size is normal. The patient is rotated to the right on today's exam, resulting in distortion of the mediastinal contours and reduced diagnostic sensitivity and specificity for mediastinal pathology.  Atherosclerosis in the thoracic aorta.  Supine and upright views of the abdomen demonstrate gas, stool and oral contrast material scattered throughout the colon.  No pathologic distension of small bowel.  No pneumoperitoneum.  IMPRESSION: 1.  Nonobstructive bowel gas pattern. 2.  No pneumoperitoneum. 3.  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   Original Report Authenticated By: Trudie Reed, M.D.      1. Constipation   2. Pulmonary infiltrate in right lung on chest x-ray       MDM   Pt to f/u as outpatient for chronic appearing infection in lungs. Will give trial of z-pak.   No evidence of obstruction. Will treat constipation with stool softeners and miralax.        Loren Racer, MD 03/14/13 0900

## 2013-03-14 NOTE — ED Notes (Signed)
See assessment.  Pt reports constipation x 5 days.  LBM yesterday.

## 2013-03-27 ENCOUNTER — Emergency Department (HOSPITAL_COMMUNITY)
Admission: EM | Admit: 2013-03-27 | Discharge: 2013-03-27 | Disposition: A | Discharge status: Home / Self Care | Payer: Self-pay | Attending: Emergency Medicine | Admitting: Emergency Medicine

## 2013-03-27 ENCOUNTER — Encounter (HOSPITAL_COMMUNITY): Payer: Self-pay | Admitting: Nurse Practitioner

## 2013-03-27 DIAGNOSIS — Z8781 Personal history of (healed) traumatic fracture: Secondary | ICD-10-CM | POA: Insufficient documentation

## 2013-03-27 DIAGNOSIS — J45909 Unspecified asthma, uncomplicated: Secondary | ICD-10-CM | POA: Insufficient documentation

## 2013-03-27 DIAGNOSIS — G8918 Other acute postprocedural pain: Secondary | ICD-10-CM | POA: Insufficient documentation

## 2013-03-27 DIAGNOSIS — M79605 Pain in left leg: Secondary | ICD-10-CM

## 2013-03-27 DIAGNOSIS — Z79899 Other long term (current) drug therapy: Secondary | ICD-10-CM | POA: Insufficient documentation

## 2013-03-27 DIAGNOSIS — M79609 Pain in unspecified limb: Secondary | ICD-10-CM | POA: Insufficient documentation

## 2013-03-27 DIAGNOSIS — Z8739 Personal history of other diseases of the musculoskeletal system and connective tissue: Secondary | ICD-10-CM | POA: Insufficient documentation

## 2013-03-27 DIAGNOSIS — F17200 Nicotine dependence, unspecified, uncomplicated: Secondary | ICD-10-CM | POA: Insufficient documentation

## 2013-03-27 DIAGNOSIS — Z8719 Personal history of other diseases of the digestive system: Secondary | ICD-10-CM | POA: Insufficient documentation

## 2013-03-27 MED ORDER — IBUPROFEN 800 MG PO TABS: 800.0000 mg | ORAL_TABLET | Freq: Three times a day (TID) | ORAL | Status: DC

## 2013-03-27 MED ORDER — IBUPROFEN 400 MG PO TABS
800.0000 mg | ORAL_TABLET | Freq: Once | ORAL | Status: AC
  Administered 2013-03-27: 800 mg via ORAL
  Filled 2013-03-27: qty 2

## 2013-03-27 NOTE — ED Notes (Signed)
Pt denies any question upon discharge.

## 2013-03-27 NOTE — ED Provider Notes (Signed)
Medical screening examination/treatment/procedure(s) were performed by non-physician practitioner and as supervising physician I was immediately available for consultation/collaboration.    Marcus Groll R Darick Fetters, MD 03/27/13 2345 

## 2013-03-27 NOTE — ED Notes (Signed)
Pt reports he had cast removed from LLE approx 1 month ago. Since the cast was removed he has continued to have swelling and pain to the LLE. Pt has not followed up with ortho md

## 2013-03-27 NOTE — ED Provider Notes (Signed)
History    This chart was scribed for non-physician practitioner Frederick Anis, PA-C working with Frederick Kras, MD by [name], ED Scribe. This patient was seen in room TR06C/TR06C and the patient's care was started at 8:55 PM.   CSN: [id number]  Arrival date & time 03/27/13  4098   First MD Initiated Contact with Patient 03/27/13 2037      Chief Complaint  Patient presents with  . Leg Swelling     The history is provided by the patient. No language interpreter was used.  Frederick [name] is a 52 y.o. male who presents to the Emergency Department complaining of left lower leg swelling since having cast removed one month ago, pt had cast post-surgery having rod placed in lower left leg.  Pt reports some associated pain that is worsened by ambulation.  Pt has not followed up with orthopedics Dr Dion Saucier about the symptoms.     Past Medical History  Diagnosis Date  . Arthritis   . Asthma   . EtOH dependence     drinks 2-3 40s a day  . Fibula fracture     Dr Dion Saucier 02-2011  . Hernia     surgery 01-17-13    Past Surgical History  Procedure Laterality Date  . Ankle surgery    . Inguinal hernia repair Right 01/19/2013    Procedure: HERNIA REPAIR INGUINAL ADULT;  Surgeon: [name], MD,FACS;  Location: [facility] OR;  Service: General;  Laterality: Right;  . Tibia im nail insertion Left 01/22/2013    Procedure: INTRAMEDULLARY (IM) NAIL TIBIAL;  Surgeon: [name], MD;  Location: [facility] OR;  Service: Orthopedics;  Laterality: Left;    Family History  Problem Relation Age of Onset  . Diabetes Mother   . Diabetes Father   . Hypertension Father     History  Substance Use Topics  . Smoking status: Current Every Day Smoker -- 1.00 packs/day    Types: Cigarettes  . Smokeless tobacco: Not on file     Comment: 1 ppd  . Alcohol Use: 8.4 oz/week    14 Cans of beer per week     Comment: occasionally; generally drinks (2) 40s/day      Review of Systems  Cardiovascular: Positive for leg  swelling.    Allergies  Review of patient's allergies indicates no known allergies.  Home Medications   Current Outpatient Rx  Name  Route  Sig  Dispense  Refill  . EPINEPHrine (EPI-PEN) 0.3 mg/0.3 mL DEVI   Intramuscular   Inject 0.3 mg into the muscle as needed (for anaphalaxis).         . naproxen (NAPROSYN) 500 MG tablet   Oral   Take 1 tablet (500 mg total) by mouth 2 (two) times daily.   20 tablet   0   . oxyCODONE-acetaminophen (PERCOCET/ROXICET) 5-325 MG per tablet   Oral   Take 1 tablet by mouth every 6 (six) hours as needed for pain.   15 tablet   0     BP 111/67  Pulse 100  Temp(Src) 98.5 F (36.9 C) (Oral)  Resp 16  SpO2 98%  Physical Exam  Nursing note and vitals reviewed. Constitutional: He is oriented to person, place, and time. He appears well-developed and well-nourished. No distress.  HENT:  Head: Normocephalic and atraumatic.  Eyes: EOM are normal.  Neck: Neck supple. No tracheal deviation present.  Cardiovascular: Normal rate.   Pulmonary/Chest: Effort normal. No respiratory distress.  Musculoskeletal: Normal range  of motion.  Neurological: He is alert and oriented to person, place, and time.  Skin: Skin is warm and dry.  Psychiatric: He has a normal mood and affect. His behavior is normal.    ED Course  Procedures (including critical care time) DIAGNOSTIC STUDIES: Oxygen Saturation is 98% on room air, normal by my interpretation.    COORDINATION OF CARE: 8:59 PM- Informed pt that swelling is normal as healing occurs.  Advised pt to avoid ambulation and to apply pressure when ambulating is necessary.  Advise pt to keep leg elevated whenever possible.  Discussed anti-inflammatories here.  Informed pt that follow-up with Dr. Dion Saucier is necessary.   No diagnosis found. 1. Lower left leg pain   MDM  Patient has recent fracture (2 months ago) with cast removal during ortho follow up one month ago Dion Saucier). He reports persistent swelling  and discomfort that is unchanged. No calf tenderness with persistent symptoms - doubt DVT, suspect symptoms of swelling are consistent with post-operative condition. Refer to Dr. Dion Saucier.      I personally performed the services described in this documentation, which was scribed in my presence. The recorded information has been reviewed and is accurate.     [name], PA-C 03/27/13 2146

## 2013-04-20 IMAGING — CR DG RIBS W/ CHEST 3+V*R*
5 series · 5 of 5 positions shown · non-contrast
Comparison: Chest x-ray on 03/22/2012

CLINICAL DATA: Fall with right lower rib pain.

RIGHT RIBS AND CHEST - 3+ VIEW

[t chest supine]
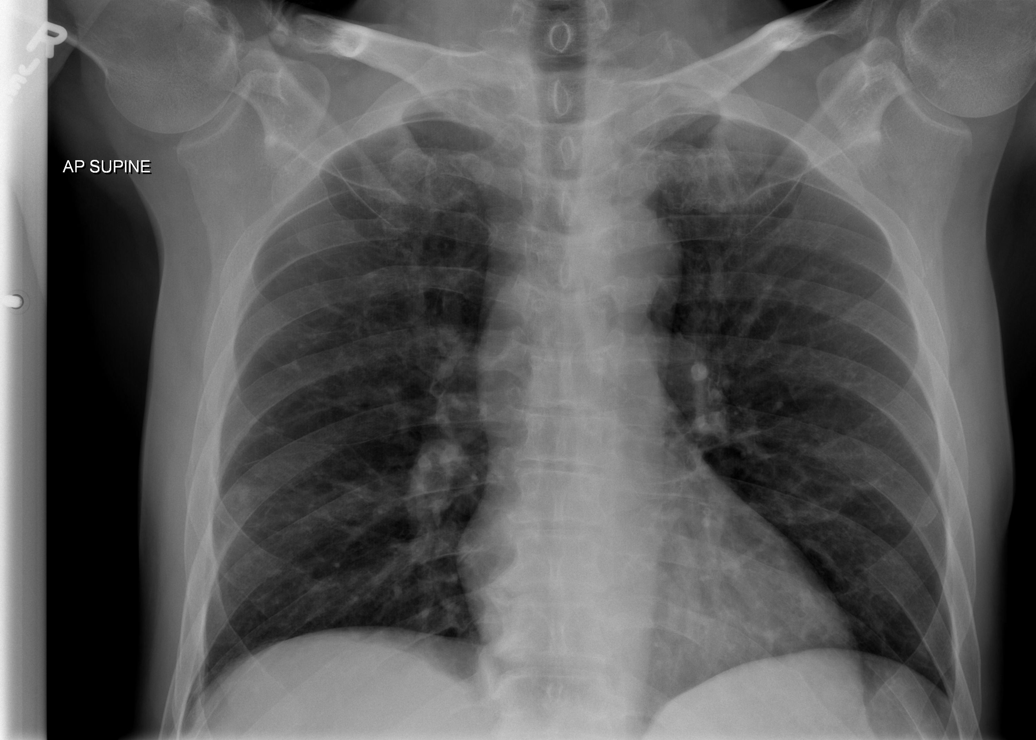

[t ribs ap upper right]
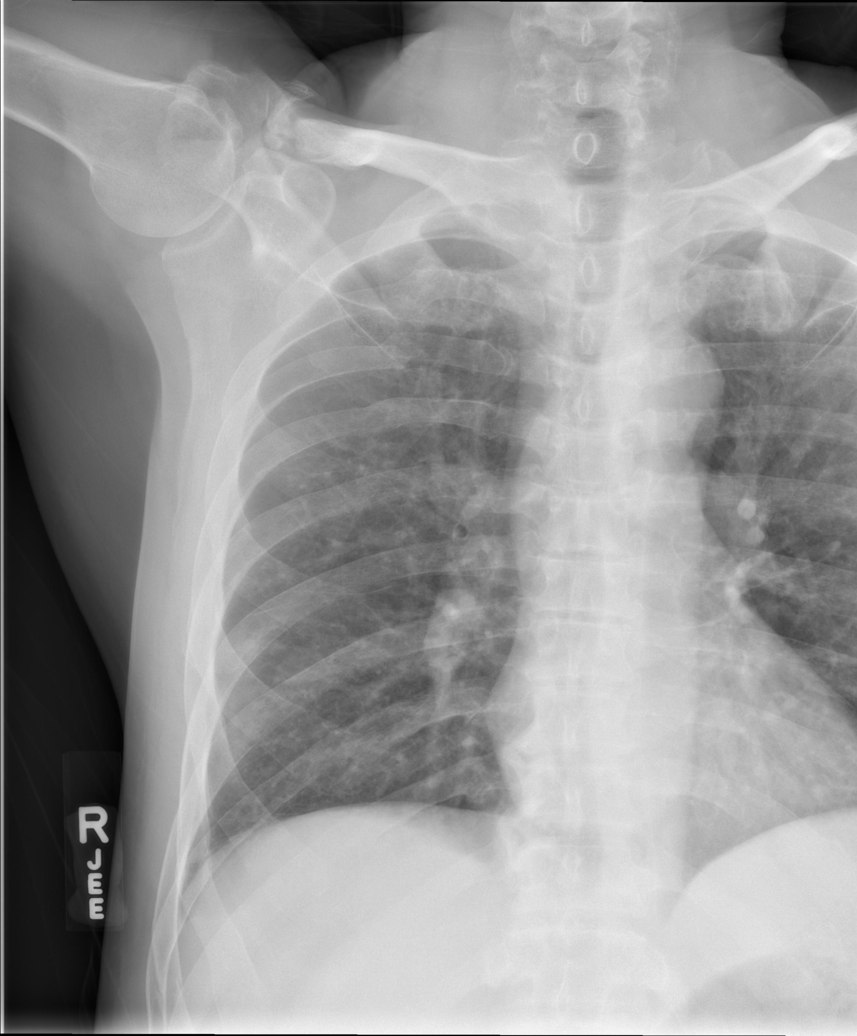

[t ribs ap lower right]
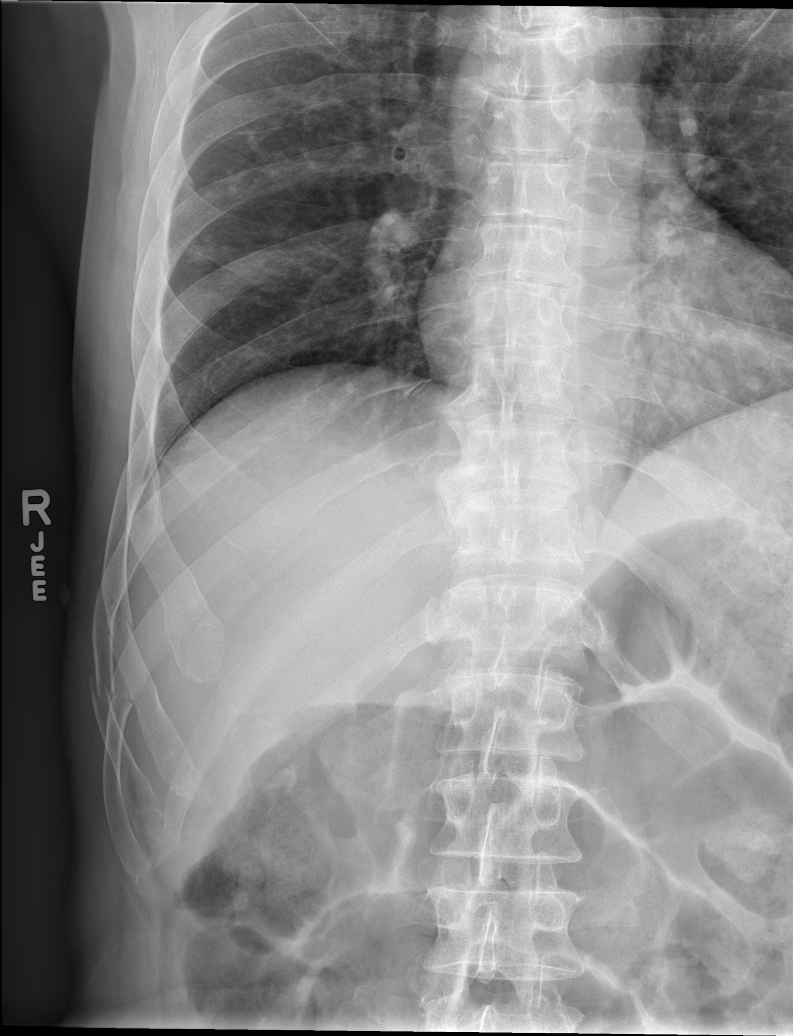

[t ribs rpo right (1 of 2)]
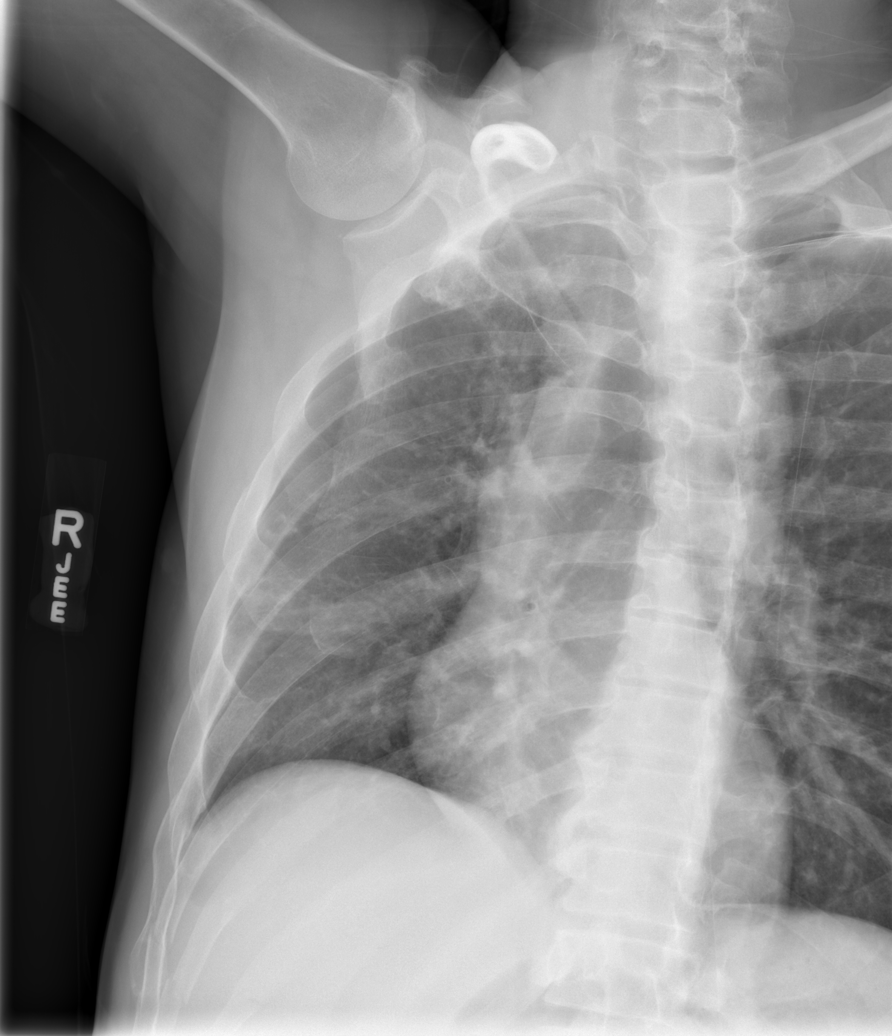

[t ribs rpo right (2 of 2)]
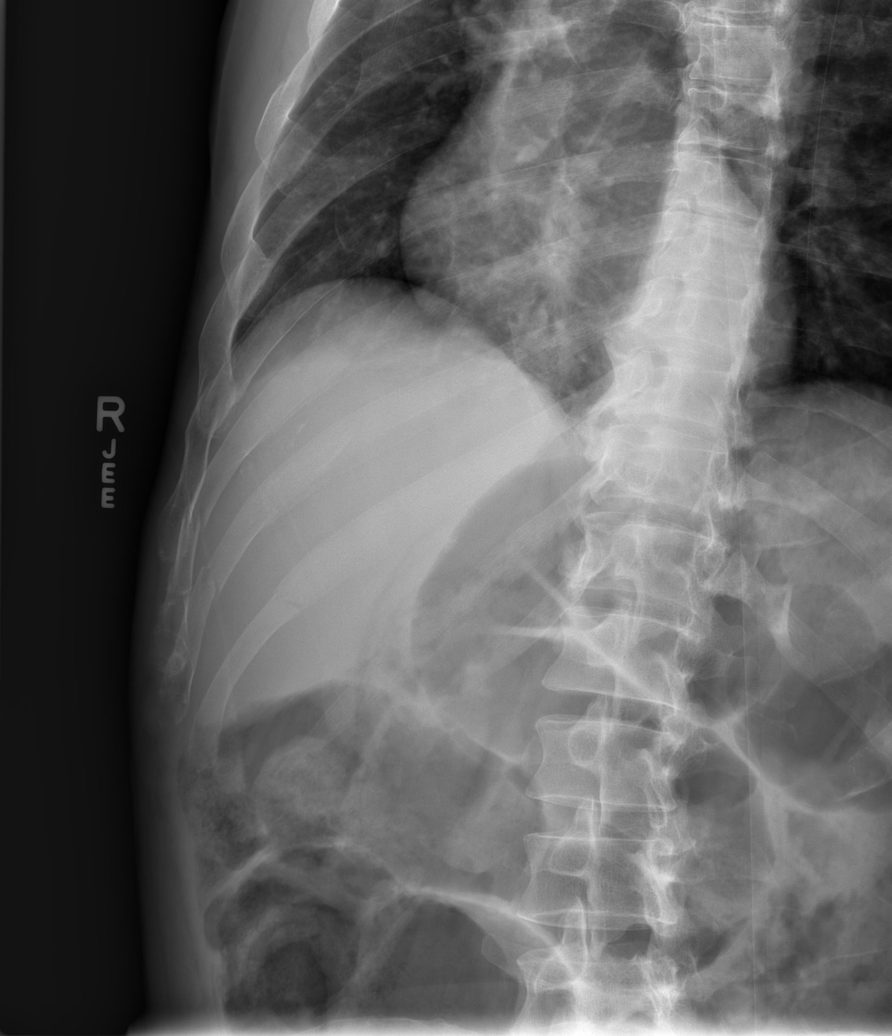

[5 of 5 positions shown; findings below may reference images not displayed]

FINDINGS: Frontal chest radiograph shows no evidence of
pneumothorax, consolidation or pleural fluid.  Right-sided rib
films show acute fractures involving the right seventh, eighth,
ninth and tenth ribs.  Eighth and ninth ribs show mild
displacement.  Seventh and tenth rib fractures are essentially
nondisplaced.  There is healed irregularity involving the distal
aspect of the sixth rib suggestive of an old fracture.
IMPRESSION: Acute fractures of the right seventh, eighth, ninth and tenth ribs.
Eighth and ninth rib fractures show mild displacement.  No
associated pneumothorax or hemothorax.

## 2013-09-18 ENCOUNTER — Emergency Department (HOSPITAL_COMMUNITY)
Admission: EM | Admit: 2013-09-18 | Discharge: 2013-09-18 | Disposition: A | Discharge status: Home / Self Care | Payer: Self-pay | Attending: Emergency Medicine | Admitting: Emergency Medicine

## 2013-09-18 ENCOUNTER — Encounter (HOSPITAL_COMMUNITY): Payer: Self-pay | Admitting: Emergency Medicine

## 2013-09-18 ENCOUNTER — Emergency Department (HOSPITAL_COMMUNITY): Payer: Self-pay

## 2013-09-18 DIAGNOSIS — Y9389 Activity, other specified: Secondary | ICD-10-CM | POA: Insufficient documentation

## 2013-09-18 DIAGNOSIS — F17200 Nicotine dependence, unspecified, uncomplicated: Secondary | ICD-10-CM | POA: Insufficient documentation

## 2013-09-18 DIAGNOSIS — M129 Arthropathy, unspecified: Secondary | ICD-10-CM | POA: Insufficient documentation

## 2013-09-18 DIAGNOSIS — J45909 Unspecified asthma, uncomplicated: Secondary | ICD-10-CM | POA: Insufficient documentation

## 2013-09-18 DIAGNOSIS — S139XXA Sprain of joints and ligaments of unspecified parts of neck, initial encounter: Secondary | ICD-10-CM | POA: Insufficient documentation

## 2013-09-18 DIAGNOSIS — M25519 Pain in unspecified shoulder: Secondary | ICD-10-CM | POA: Insufficient documentation

## 2013-09-18 DIAGNOSIS — S161XXA Strain of muscle, fascia and tendon at neck level, initial encounter: Secondary | ICD-10-CM

## 2013-09-18 DIAGNOSIS — Y92410 Unspecified street and highway as the place of occurrence of the external cause: Secondary | ICD-10-CM | POA: Insufficient documentation

## 2013-09-18 MED ORDER — IBUPROFEN 800 MG PO TABS: 800.0000 mg | ORAL_TABLET | Freq: Three times a day (TID) | ORAL | Status: DC | PRN

## 2013-09-18 MED ORDER — HYDROCODONE-ACETAMINOPHEN 5-325 MG PO TABS
1.0000 | ORAL_TABLET | Freq: Once | ORAL | Status: AC
  Administered 2013-09-18: 1 via ORAL
  Filled 2013-09-18: qty 1

## 2013-09-18 MED ORDER — HYDROCODONE-ACETAMINOPHEN 5-325 MG PO TABS: 1.0000 | ORAL_TABLET | Freq: Four times a day (QID) | ORAL | Status: DC | PRN
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
## 2013-09-19 NOTE — ED Provider Notes (Signed)
Medical screening examination/treatment/procedure(s) were performed by non-physician practitioner and as supervising physician I was immediately available for consultation/collaboration.   Nayra Coury M Mcgwire Dasaro, DO 09/19/13 2202 

## 2014-03-10 ENCOUNTER — Emergency Department (HOSPITAL_COMMUNITY): Payer: Medicaid Other

## 2014-03-10 ENCOUNTER — Encounter (HOSPITAL_COMMUNITY): Payer: Self-pay | Admitting: Emergency Medicine

## 2014-03-10 ENCOUNTER — Emergency Department (HOSPITAL_COMMUNITY)
Admission: EM | Admit: 2014-03-10 | Discharge: 2014-03-10 | Disposition: A | Discharge status: Home / Self Care | Payer: Medicaid Other | Attending: Emergency Medicine | Admitting: Emergency Medicine

## 2014-03-10 DIAGNOSIS — Z791 Long term (current) use of non-steroidal anti-inflammatories (NSAID): Secondary | ICD-10-CM | POA: Insufficient documentation

## 2014-03-10 DIAGNOSIS — S298XXA Other specified injuries of thorax, initial encounter: Secondary | ICD-10-CM | POA: Diagnosis present

## 2014-03-10 DIAGNOSIS — R109 Unspecified abdominal pain: Secondary | ICD-10-CM | POA: Diagnosis not present

## 2014-03-10 DIAGNOSIS — M129 Arthropathy, unspecified: Secondary | ICD-10-CM | POA: Insufficient documentation

## 2014-03-10 DIAGNOSIS — Y929 Unspecified place or not applicable: Secondary | ICD-10-CM | POA: Insufficient documentation

## 2014-03-10 DIAGNOSIS — Z9889 Other specified postprocedural states: Secondary | ICD-10-CM | POA: Insufficient documentation

## 2014-03-10 DIAGNOSIS — F17200 Nicotine dependence, unspecified, uncomplicated: Secondary | ICD-10-CM | POA: Insufficient documentation

## 2014-03-10 DIAGNOSIS — Y9389 Activity, other specified: Secondary | ICD-10-CM | POA: Diagnosis not present

## 2014-03-10 DIAGNOSIS — J45909 Unspecified asthma, uncomplicated: Secondary | ICD-10-CM | POA: Diagnosis not present

## 2014-03-10 DIAGNOSIS — W1809XA Striking against other object with subsequent fall, initial encounter: Secondary | ICD-10-CM | POA: Diagnosis not present

## 2014-03-10 DIAGNOSIS — S2249XA Multiple fractures of ribs, unspecified side, initial encounter for closed fracture: Secondary | ICD-10-CM | POA: Diagnosis not present

## 2014-03-10 DIAGNOSIS — Z8719 Personal history of other diseases of the digestive system: Secondary | ICD-10-CM | POA: Diagnosis not present

## 2014-03-10 DIAGNOSIS — S3981XA Other specified injuries of abdomen, initial encounter: Secondary | ICD-10-CM | POA: Diagnosis not present

## 2014-03-10 DIAGNOSIS — S2242XA Multiple fractures of ribs, left side, initial encounter for closed fracture: Secondary | ICD-10-CM

## 2014-03-10 LAB — BASIC METABOLIC PANEL
BUN: 14 mg/dL (ref 6–23)
CHLORIDE: 99 meq/L (ref 96–112)
CO2: 27 meq/L (ref 19–32)
Calcium: 9 mg/dL (ref 8.4–10.5)
Creatinine, Ser: 0.92 mg/dL (ref 0.50–1.35)
GFR calc non Af Amer: >90 mL/min (ref >90)
Glucose, Bld: 77 mg/dL (ref 70–99)
POTASSIUM: 4.3 meq/L (ref 3.7–5.3)
Sodium: 141 mEq/L (ref 137–147)

## 2014-03-10 LAB — URINALYSIS, ROUTINE W REFLEX MICROSCOPIC
GLUCOSE, UA: NEGATIVE mg/dL
Hgb urine dipstick: NEGATIVE
Ketones, ur: NEGATIVE mg/dL
LEUKOCYTES UA: NEGATIVE
NITRITE: NEGATIVE
PH: 5.5 (ref 5.0–8.0)
Protein, ur: 30 mg/dL — AB
SPECIFIC GRAVITY, URINE: 1.031 — AB (ref 1.005–1.030)
Urobilinogen, UA: 0.2 mg/dL (ref 0.0–1.0)

## 2014-03-10 LAB — URINE MICROSCOPIC-ADD ON

## 2014-03-10 MED ORDER — IOHEXOL 300 MG/ML  SOLN
20.0000 mL | INTRAMUSCULAR | Status: DC
  Administered 2014-03-10: 25 mL via ORAL

## 2014-03-10 MED ORDER — MORPHINE SULFATE 4 MG/ML IJ SOLN
4.0000 mg | Freq: Once | INTRAMUSCULAR | Status: AC
  Administered 2014-03-10: 4 mg via INTRAVENOUS
  Filled 2014-03-10: qty 1

## 2014-03-10 MED ORDER — IOHEXOL 300 MG/ML  SOLN
100.0000 mL | Freq: Once | INTRAMUSCULAR | Status: AC | PRN
  Administered 2014-03-10: 100 mL via INTRAVENOUS

## 2014-03-10 MED ORDER — OXYCODONE-ACETAMINOPHEN 5-325 MG PO TABS: 1.0000 | ORAL_TABLET | ORAL | Status: DC | PRN

## 2014-03-10 NOTE — ED Notes (Signed)
Onset last night pt was getting out of a chair, pt slipped and fell against another chair.  Pain to left lateral back.  Pain when taking deep breathing or with movement.

## 2014-03-10 NOTE — ED Notes (Signed)
Patient returned from CT

## 2014-03-10 NOTE — ED Notes (Signed)
The pt fell last pm and injured his lt ribs.  C/o pain there.

## 2014-03-10 NOTE — Discharge Instructions (Signed)
Rib Fracture  A rib fracture is a break or crack in one of the bones of the ribs. The ribs are a group of long, curved bones that wrap around your chest and attach to your spine. They protect your lungs and other organs in the chest cavity. A broken or cracked rib is often painful, but most do not cause other problems. Most rib fractures heal on their own over time. However, rib fractures can be more serious if multiple ribs are broken or if broken ribs move out of place and push against other structures.  CAUSES   · A direct blow to the chest. For example, this could happen during contact sports, a car accident, or a fall against a hard object.  · Repetitive movements with high force, such as pitching a baseball or having severe coughing spells.  SYMPTOMS   · Pain when you breathe in or cough.  · Pain when someone presses on the injured area.  DIAGNOSIS   Your caregiver will perform a physical exam. Various imaging tests may be ordered to confirm the diagnosis and to look for related injuries. These tests may include a chest X-ray, computed tomography (CT), magnetic resonance imaging (MRI), or a bone scan.  TREATMENT   Rib fractures usually heal on their own in 1 3 months. The longer healing period is often associated with a continued cough or other aggravating activities. During the healing period, pain control is very important. Medication is usually given to control pain. Hospitalization or surgery may be needed for more severe injuries, such as those in which multiple ribs are broken or the ribs have moved out of place.   HOME CARE INSTRUCTIONS   · Avoid strenuous activity and any activities or movements that cause pain. Be careful during activities and avoid bumping the injured rib.  · Gradually increase activity as directed by your caregiver.  · Only take over-the-counter or prescription medications as directed by your caregiver. Do not take other medications without asking your caregiver first.  · Apply ice  to the injured area for the first 1 2 days after you have been treated or as directed by your caregiver. Applying ice helps to reduce inflammation and pain.  · Put ice in a plastic bag.  · Place a towel between your skin and the bag.    · Leave the ice on for 15 20 minutes at a time, every 2 hours while you are awake.  · Perform deep breathing as directed by your caregiver. This will help prevent pneumonia, which is a common complication of a broken rib. Your caregiver may instruct you to:  · Take deep breaths several times a day.  · Try to cough several times a day, holding a pillow against the injured area.  · Use a device called an incentive spirometer to practice deep breathing several times a day.  · Drink enough fluids to keep your urine clear or pale yellow. This will help you avoid constipation.    · Do not wear a rib belt or binder. These restrict breathing, which can lead to pneumonia.    SEEK IMMEDIATE MEDICAL CARE IF:   · You have a fever.    · You have difficulty breathing or shortness of breath.    · You develop a continual cough, or you cough up thick or bloody sputum.  · You feel sick to your stomach (nausea), throw up (vomit), or have abdominal pain.    · You have worsening pain not controlled with medications.      MAKE SURE YOU:  · Understand these instructions.  · Will watch your condition.  · Will get help right away if you are not doing well or get worse.  Document Released: 11/22/2005 Document Revised: 07/25/2013 Document Reviewed: 01/24/2013  [employer]® Patient Information ©2014 [employer].

## 2014-03-10 NOTE — ED Provider Notes (Signed)
CSN: [id number]     Arrival date & time 03/10/14  1616 History  This chart was scribed for non-physician practitioner, Elpidio AnisShari Therese Rocco, PA-C working with Merrie RoofJohn David Wofford III, MD by Greggory StallionKayla Andersen, ED scribe. This patient was seen in room TR05C/TR05C and the patient's care was started at 5:29 PM.   Chief Complaint  Patient presents with  . Rib Injury   The history is provided by the patient. No language interpreter was used.   HPI Comments: Frederick [name] is a 53 y.o. male who presents to the Emergency Department complaining of left rib injury that occurred last night. Pt states he tripped, fell and landed on his left ribs on a desk. He has sudden onset left rib pain. Pt has fractured his ribs in the past and states this feels similar. Denies abdominal pain, nausea, emesis, hematuria. Pt drinks large amounts of beer daily.   Past Medical History  Diagnosis Date  . Arthritis   . Asthma   . EtOH dependence     drinks 2-3 40s a day  . Fibula fracture     Dr Dion SaucierLandau 02-2011  . Hernia     surgery 01-17-13   Past Surgical History  Procedure Laterality Date  . Ankle surgery    . Inguinal hernia repair Right 01/19/2013    Procedure: HERNIA REPAIR INGUINAL ADULT;  Surgeon: Atilano InaEric M Wilson, MD,FACS;  Location: [facility] OR;  Service: General;  Laterality: Right;  . Tibia im nail insertion Left 01/22/2013    Procedure: INTRAMEDULLARY (IM) NAIL TIBIAL;  Surgeon: Eulas PostJoshua P Landau, MD;  Location: [facility] OR;  Service: Orthopedics;  Laterality: Left;   Family History  Problem Relation Age of Onset  . Diabetes Mother   . Diabetes Father   . Hypertension Father    History  Substance Use Topics  . Smoking status: Current Every Day Smoker -- 1.00 packs/day    Types: Cigarettes  . Smokeless tobacco: Not on file     Comment: 1 ppd  . Alcohol Use: 8.4 oz/week    14 Cans of beer per week     Comment: occasionally; generally drinks (2) 40s/day    Review of Systems  Gastrointestinal: Negative for nausea, vomiting  and abdominal pain.  Genitourinary: Negative for hematuria.  Musculoskeletal:       Left rib pain.  All other systems reviewed and are negative.   Allergies  Review of patient's allergies indicates no known allergies.  Home Medications   Current Outpatient Rx  Name  Route  Sig  Dispense  Refill  . HYDROcodone-acetaminophen (NORCO/VICODIN) 5-325 MG per tablet   Oral   Take 1 tablet by mouth every 6 (six) hours as needed for pain.   15 tablet   0   . ibuprofen (ADVIL,MOTRIN) 800 MG tablet   Oral   Take 1 tablet (800 mg total) by mouth 3 (three) times daily.   21 tablet   0   . ibuprofen (ADVIL,MOTRIN) 800 MG tablet   Oral   Take 1 tablet (800 mg total) by mouth every 8 (eight) hours as needed for pain.   21 tablet   0    BP 133/83  Pulse 89  Temp(Src) 98.1 F (36.7 C) (Oral)  Resp 18  Ht 6\' 1"  (1.854 m)  Wt 175 lb (79.379 kg)  BMI 23.09 kg/m2  SpO2 98%  Physical Exam  Nursing note and vitals reviewed. Constitutional: He is oriented to person, place, and time. He appears well-developed and well-nourished. No distress.  HENT:  Head: Normocephalic and atraumatic.  Eyes: EOM are normal.  Neck: Neck supple. No tracheal deviation present.  Cardiovascular: Normal rate.   Pulmonary/Chest: Effort normal. No respiratory distress.  Left lateral chest wall and left lateral abdominal wall without swelling or discoloration.   Abdominal: There is tenderness. There is rebound and guarding.  Left sided abdominal pain, upper greater than lower with some rebounding and guarding. Left flank tenderness.   Musculoskeletal: Normal range of motion.  Neurological: He is alert and oriented to person, place, and time.  Skin: Skin is warm and dry.  Psychiatric: He has a normal mood and affect. His behavior is normal.    ED Course  Procedures (including critical care time)  DIAGNOSTIC STUDIES: Oxygen Saturation is 98% on RA, normal by my interpretation.    COORDINATION OF  CARE: 5:32 PM-Discussed treatment plan which includes abdominal CT and UA with pt at bedside and pt agreed to plan.   Labs Review Labs Reviewed  URINALYSIS, ROUTINE W REFLEX MICROSCOPIC - Abnormal; Notable for the following:    Specific Gravity, Urine 1.031 (*)    Bilirubin Urine SMALL (*)    Protein, ur 30 (*)    All other components within normal limits  URINE MICROSCOPIC-ADD ON - Abnormal; Notable for the following:    Bacteria, UA FEW (*)    All other components within normal limits  BASIC METABOLIC PANEL   Imaging Review Dg Ribs Unilateral W/chest Left  03/10/2014   CLINICAL DATA:  Left-sided chest pain secondary to a fall last night.  EXAM: LEFT RIBS AND CHEST - 3+ VIEW  COMPARISON:  Radiographs dated 09/18/2013 and 03/14/2013 and chest CT dated 03/11/2013  FINDINGS: There is an acute displaced fracture of the posterior lateral aspect of the left tenth rib. There are multiple old bilateral healed rib fractures.  There is no pleural effusion or pneumothorax or lung contusion. Heart size and pulmonary vascularity are normal.  The chronic inflammatory changes in the right midlung zone appear progressed since the prior CT scan of 03/11/2013 and since the chest x-ray dated 09/18/2013. Lungs are otherwise clear.  IMPRESSION: 1. Acute slightly displaced fracture of the posterior lateral aspect of the left tenth rib. 2. Progressive chronic inflammatory changes in the right midzone. Is the patient immunocompromised? The possibility of atypical infection should be considered.   Electronically Signed   By: Geanie Cooley M.D.   On: 03/10/2014 17:31   Ct Abdomen Pelvis W Contrast  03/10/2014   CLINICAL DATA:  Left rib pain secondary to a fall last night. Fracture of the left tenth rib.  EXAM: CT ABDOMEN AND PELVIS WITH CONTRAST  TECHNIQUE: Multidetector CT imaging of the abdomen and pelvis was performed using the standard protocol following bolus administration of intravenous contrast.  CONTRAST:   OMNIPAQUE IOHEXOL 300 MG/ML  SOLN  COMPARISON:  Radiographs dated 03/14/2013 and CT scan dated 03/10/2013 and chest CT dated 03/11/2013  FINDINGS: There are fractures of the posterior aspects of the left tenth and eleventh ribs as well as old well-healed fractures of the same ribs. There is no pneumothorax or pleural effusion.  Liver, biliary tree, spleen, pancreas, adrenal glands, and kidneys are normal. There is no free air or free fluid in the abdomen. There are no liver or spleen lacerations. The patient's cecum is dilated to 10 cm diameter and lies in the mid abdomen at and below the umbilicus. The cecum is mobile but there is no volvulus at this time. No dilated small  bowel.  IMPRESSION: 1. Acute fractures of the posterior lateral aspects of the left tenth and eleventh ribs. 2. No acute intra-abdominal abnormality.   Electronically Signed   By: Geanie Cooley M.D.   On: 03/10/2014 19:07     EKG Interpretation None      MDM   Final diagnoses:  None    1. Rib fractures  He is more comfortable with IV pain medication. VSS, he continues to have a normal O2 saturation and is not in any distress. CT scan without evidence of any intra-abdominal injury. He can be discharged home.   I personally performed the services described in this documentation, which was scribed in my presence. The recorded information has been reviewed and is accurate.  [name], PA-C 03/11/14 [phone]

## 2014-03-10 NOTE — ED Notes (Signed)
Notified CT patient finished contrast.

## 2014-03-12 NOTE — ED Provider Notes (Signed)
Medical screening examination/treatment/procedure(s) were performed by non-physician practitioner and as supervising physician I was immediately available for consultation/collaboration.   Geordie Nooney, MD 03/12/14 0816 

## 2014-03-21 ENCOUNTER — Encounter (HOSPITAL_COMMUNITY): Payer: Self-pay | Admitting: Emergency Medicine

## 2014-03-21 ENCOUNTER — Emergency Department (HOSPITAL_COMMUNITY)
Admission: EM | Admit: 2014-03-21 | Discharge: 2014-03-21 | Disposition: A | Discharge status: Home / Self Care | Payer: Medicaid Other | Attending: Emergency Medicine | Admitting: Emergency Medicine

## 2014-03-21 ENCOUNTER — Emergency Department (HOSPITAL_COMMUNITY): Payer: Medicaid Other

## 2014-03-21 DIAGNOSIS — F1021 Alcohol dependence, in remission: Secondary | ICD-10-CM | POA: Diagnosis not present

## 2014-03-21 DIAGNOSIS — Z8719 Personal history of other diseases of the digestive system: Secondary | ICD-10-CM | POA: Diagnosis not present

## 2014-03-21 DIAGNOSIS — G8911 Acute pain due to trauma: Secondary | ICD-10-CM | POA: Diagnosis not present

## 2014-03-21 DIAGNOSIS — R071 Chest pain on breathing: Secondary | ICD-10-CM | POA: Insufficient documentation

## 2014-03-21 DIAGNOSIS — F17200 Nicotine dependence, unspecified, uncomplicated: Secondary | ICD-10-CM | POA: Insufficient documentation

## 2014-03-21 DIAGNOSIS — Z8739 Personal history of other diseases of the musculoskeletal system and connective tissue: Secondary | ICD-10-CM | POA: Diagnosis not present

## 2014-03-21 DIAGNOSIS — Z8781 Personal history of (healed) traumatic fracture: Secondary | ICD-10-CM | POA: Insufficient documentation

## 2014-03-21 DIAGNOSIS — J45909 Unspecified asthma, uncomplicated: Secondary | ICD-10-CM | POA: Insufficient documentation

## 2014-03-21 DIAGNOSIS — R0781 Pleurodynia: Secondary | ICD-10-CM

## 2014-03-21 MED ORDER — CYCLOBENZAPRINE HCL 10 MG PO TABS: 10.0000 mg | ORAL_TABLET | Freq: Two times a day (BID) | ORAL | Status: DC | PRN

## 2014-03-21 MED ORDER — ONDANSETRON 4 MG PO TBDP
4.0000 mg | ORAL_TABLET | Freq: Once | ORAL | Status: AC
  Administered 2014-03-21: 4 mg via ORAL
  Filled 2014-03-21: qty 1

## 2014-03-21 MED ORDER — HYDROMORPHONE HCL PF 1 MG/ML IJ SOLN
1.0000 mg | Freq: Once | INTRAMUSCULAR | Status: AC
  Administered 2014-03-21: 1 mg via INTRAMUSCULAR
  Filled 2014-03-21: qty 1

## 2014-03-21 MED ORDER — OXYCODONE-ACETAMINOPHEN 5-325 MG PO TABS: 2.0000 | ORAL_TABLET | ORAL | Status: DC | PRN

## 2014-03-21 MED ORDER — IBUPROFEN 400 MG PO TABS
800.0000 mg | ORAL_TABLET | Freq: Once | ORAL | Status: AC
  Administered 2014-03-21: 800 mg via ORAL
  Filled 2014-03-21: qty 2

## 2014-03-21 NOTE — Discharge Planning (Signed)
[facility] Felicia E, [facility]iaison  Spoke to patient about primary care resources.  Patient states he was a former orange card holder at [facility] and was displaced when the practice closed. Appointment was made to get the orange card and [facility] at 9:00am. Orange card application and my contact information was provided for any future questions or concerns. Patient is aware of this appointment, no other needs expressed at this time.

## 2014-03-21 NOTE — ED Notes (Signed)
PT GIVEN BUS PASS 

## 2014-03-21 NOTE — Discharge Instructions (Signed)
Take Percocet as needed for pain. Take Flexeril as needed for muscle pain. You may take these medications together. Refer to attached documents for more information. Return to the ED with worsening or concerning symptoms.

## 2014-03-21 NOTE — ED Provider Notes (Signed)
CSN: [id number]     Arrival date & time 03/21/14  1017 History  This chart was scribed for non-physician practitioner, Emilia BeckKaitlyn Karolina Zamor, PA-C working with Laray AngerKathleen M McManus, DO by Greggory StallionKayla Andersen, ED scribe. This patient was seen in room TR09C/TR09C and the patient's care was started at 10:33 AM.    Chief Complaint  Patient presents with  . Rib Injury   The history is provided by the patient. No language interpreter was used.   HPI Comments: Frederick [name] is a [age] y.o. male who presents to the Emergency Department complaining of continued left rib pain. Pt was evaluated 11 days ago and diagnosed with a left rib fracture. He states he was never given a spirometer so he hasn't used anything to help him take deep breaths. Pt states he has not coughed as much as he was in the past. Certain movements worsen the pain. Denies fever. Pt has past rib injury.   Past Medical History  Diagnosis Date  . Arthritis   . Asthma   . EtOH dependence     drinks 2-3 40s a day  . Fibula fracture     Dr Dion SaucierLandau 02-2011  . Hernia     surgery 01-17-13   Past Surgical History  Procedure Laterality Date  . Ankle surgery    . Inguinal hernia repair Right 01/19/2013    Procedure: HERNIA REPAIR INGUINAL ADULT;  Surgeon: Atilano InaEric M Wilson, MD,FACS;  Location: [facility] OR;  Service: General;  Laterality: Right;  . Tibia im nail insertion Left 01/22/2013    Procedure: INTRAMEDULLARY (IM) NAIL TIBIAL;  Surgeon: Eulas PostJoshua P Landau, MD;  Location: [facility] OR;  Service: Orthopedics;  Laterality: Left;   Family History  Problem Relation Age of Onset  . Diabetes Mother   . Diabetes Father   . Hypertension Father    History  Substance Use Topics  . Smoking status: Current Every Day Smoker -- 1.00 packs/day    Types: Cigarettes  . Smokeless tobacco: Not on file     Comment: 1 ppd  . Alcohol Use: 8.4 oz/week    14 Cans of beer per week     Comment: occasionally; generally drinks (2) 40s/day    Review of Systems  Constitutional:  Negative for fever.  Musculoskeletal:       Left rib pain.  All other systems reviewed and are negative.  Allergies  Review of patient's allergies indicates no known allergies.  Home Medications   Prior to Admission medications   Medication Sig Start Date End Date Taking? Authorizing Provider  oxyCODONE-acetaminophen (PERCOCET/ROXICET) 5-325 MG per tablet Take 1-2 tablets by mouth every 4 (four) hours as needed for severe pain. 03/10/14   Shari A Upstill, PA-C   BP 143/89  Pulse 96  Temp(Src) 97.4 F (36.3 C) (Oral)  SpO2 100%  Physical Exam  Nursing note and vitals reviewed. Constitutional: He is oriented to person, place, and time. He appears well-developed and well-nourished. No distress.  HENT:  Head: Normocephalic and atraumatic.  Eyes: EOM are normal.  Neck: Neck supple. No tracheal deviation present.  Cardiovascular: Normal rate, regular rhythm and normal heart sounds.   Pulmonary/Chest: Effort normal and breath sounds normal. No respiratory distress. He has no wheezes. He has no rales.  Tenderness to palpation over tenth and eleventh left posterior ribs. No obvious deformity.   Musculoskeletal: Normal range of motion.  Neurological: He is alert and oriented to person, place, and time.  Skin: Skin is warm and dry.  Psychiatric: He  has a normal mood and affect. His behavior is normal.    ED Course  Procedures (including critical care time)  DIAGNOSTIC STUDIES: Oxygen Saturation is 100% on RA, normal by my interpretation.    COORDINATION OF CARE: 10:36 AM-Discussed treatment plan which includes xray and pain medication with pt at bedside and pt agreed to plan.   11:07 AM-Advised pt of xray results. Will discharge home with a muscle relaxer and percocet.   Labs Review Labs Reviewed - No data to display  Imaging Review Dg Ribs Unilateral W/chest Left  03/21/2014   CLINICAL DATA:  Severe pain from rib fractures. Possible right midlung infiltrate.  EXAM: LEFT RIBS  AND CHEST - 3+ VIEW  COMPARISON:  03/10/2014.  FINDINGS: Improving right mid lung zone infiltrate. Left tenth rib fracture re-demonstrated. No effusion or pneumothorax.  IMPRESSION: Negative.   Electronically Signed   By: Davonna BellingJohn  Curnes M.D.   On: 03/21/2014 10:52     EKG Interpretation None      MDM   Final diagnoses:  Rib pain on left side  History of rib fracture    11:12 AM Patient's xray unremarkable for acute changes. Patient will be discharged with Percocet and Flexeril for pain. Patient will have an incentive spirometer because he states he did not get one last time he was here. Vitals stable and patient afebrile. Patient advised he would likely still have pain for a few weeks. He is advised to return to the ED with worsening or concerning symptoms.   I personally performed the services described in this documentation, which was scribed in my presence. The recorded information has been reviewed and is accurate.  Emilia BeckKaitlyn Jozelyn Kuwahara, PA-C 03/21/14 1114

## 2014-03-21 NOTE — ED Notes (Signed)
Here 2 weeks ago for rib fx.  Returns today for continued left posterior rib pain. States Percocet "doesn't really help" and only has two pills left. Also wants his "knees checked....they've been hurting for a while.Marland Kitchen.Marland Kitchen.I have arthritis."

## 2014-03-22 NOTE — ED Provider Notes (Signed)
Medical screening examination/treatment/procedure(s) were performed by non-physician practitioner and as supervising physician I was immediately available for consultation/collaboration.   EKG Interpretation None        Tannie Koskela M Zymarion Favorite, DO 03/22/14 0735 

## 2014-03-23 ENCOUNTER — Emergency Department (HOSPITAL_COMMUNITY)
Admission: EM | Admit: 2014-03-23 | Discharge: 2014-03-23 | Disposition: A | Discharge status: Home / Self Care | Payer: Medicaid Other | Attending: Emergency Medicine | Admitting: Emergency Medicine

## 2014-03-23 ENCOUNTER — Emergency Department (HOSPITAL_COMMUNITY): Payer: Medicaid Other

## 2014-03-23 ENCOUNTER — Encounter (HOSPITAL_COMMUNITY): Payer: Self-pay | Admitting: Emergency Medicine

## 2014-03-23 DIAGNOSIS — J45901 Unspecified asthma with (acute) exacerbation: Secondary | ICD-10-CM | POA: Insufficient documentation

## 2014-03-23 DIAGNOSIS — Z8781 Personal history of (healed) traumatic fracture: Secondary | ICD-10-CM | POA: Diagnosis not present

## 2014-03-23 DIAGNOSIS — R079 Chest pain, unspecified: Secondary | ICD-10-CM | POA: Diagnosis present

## 2014-03-23 DIAGNOSIS — J13 Pneumonia due to Streptococcus pneumoniae: Secondary | ICD-10-CM | POA: Insufficient documentation

## 2014-03-23 DIAGNOSIS — F17200 Nicotine dependence, unspecified, uncomplicated: Secondary | ICD-10-CM | POA: Insufficient documentation

## 2014-03-23 DIAGNOSIS — M129 Arthropathy, unspecified: Secondary | ICD-10-CM | POA: Insufficient documentation

## 2014-03-23 DIAGNOSIS — Z7982 Long term (current) use of aspirin: Secondary | ICD-10-CM | POA: Diagnosis not present

## 2014-03-23 DIAGNOSIS — J189 Pneumonia, unspecified organism: Secondary | ICD-10-CM

## 2014-03-23 DIAGNOSIS — Z8719 Personal history of other diseases of the digestive system: Secondary | ICD-10-CM | POA: Diagnosis not present

## 2014-03-23 DIAGNOSIS — J181 Lobar pneumonia, unspecified organism: Secondary | ICD-10-CM

## 2014-03-23 MED ORDER — MELOXICAM 7.5 MG PO TABS: 15.0000 mg | ORAL_TABLET | Freq: Every day | ORAL | Status: DC

## 2014-03-23 MED ORDER — KETOROLAC TROMETHAMINE 60 MG/2ML IM SOLN
60.0000 mg | Freq: Once | INTRAMUSCULAR | Status: AC
  Administered 2014-03-23: 60 mg via INTRAMUSCULAR
  Filled 2014-03-23: qty 2

## 2014-03-23 MED ORDER — CYCLOBENZAPRINE HCL 10 MG PO TABS
10.0000 mg | ORAL_TABLET | Freq: Once | ORAL | Status: AC
  Administered 2014-03-23: 10 mg via ORAL
  Filled 2014-03-23: qty 1

## 2014-03-23 MED ORDER — AZITHROMYCIN 250 MG PO TABS: 250.0000 mg | ORAL_TABLET | Freq: Every day | ORAL | Status: DC

## 2014-03-23 MED ORDER — OXYCODONE-ACETAMINOPHEN 5-325 MG PO TABS
2.0000 | ORAL_TABLET | Freq: Once | ORAL | Status: AC
  Administered 2014-03-23: 2 via ORAL
  Filled 2014-03-23: qty 2

## 2014-03-23 NOTE — ED Provider Notes (Signed)
Medical screening examination/treatment/procedure(s) were conducted as a shared visit with non-physician practitioner(s) and myself.  I personally evaluated the patient during the encounter.   EKG Interpretation None       Doug SouSam Raiford Fetterman, MD 03/23/14 1718

## 2014-03-23 NOTE — Discharge Instructions (Signed)
Be sure to use incentive spirometer as described in instructions below.  Take all antibiotics as prescribed until complete. [facility] for ongoing healthcare needs and recheck of symptoms as needed.  You may continue to experience left sided chest/rib pain for several weeks as your injury continues to heal.  Return to ER for NEW or worsening symptoms.

## 2014-03-23 NOTE — ED Notes (Signed)
Called pt to triage, pt in bathroom

## 2014-03-23 NOTE — ED Provider Notes (Signed)
Patient complains of left-sided anterior chest pain for the past 03/10/2014 when he fractured 2 ribs after a fall the bathroom. He reports cough productive of white sputum for the past 2 days. Denies fever. On exam no distress. Left anterolateral ribs tender. Lungs clear auscultation. Chest x-ray reviewed by me. In light of cough productive of white sputum we'll treat empirically with antibiotics for suspected pneumonia. Patient encouraged to use his incentive spirometer  Doug SouSam Lynna Zamorano, MD 03/23/14 [phone]

## 2014-03-23 NOTE — ED Notes (Signed)
Pt c/o left sided chest pain onset yesterday. Pt reports shortness of breath with pain. Pt reports cough ongoing since he fractured his ribs. Pt talking in complete sentences without difficulty.

## 2014-03-23 NOTE — ED Provider Notes (Signed)
CSN: [id number]     Arrival date & time 03/23/14  1251 History   First MD Initiated Contact with Patient 03/23/14 1305     Chief Complaint  Patient presents with  . Chest Pain     (Consider location/radiation/quality/duration/timing/severity/associated sxs/prior Treatment) HPI Pt is a 53yo male with hx of asthma and recently dx rib fracture on 03/10/14 c/o chest pain that started yesterday. Pt was seen on 03/21/14 for worsening left sided rib pain due to rib fracture, advised he may have pain for several weeks. Today, pt states chest pain is different as it is more constant, anterior left chest, in addition to his rib pain.  Chest pain is described as sharp and aching, 10/10, worse with cough and deep breathing. Reports SOB due to pain with deep breathing.  Difficult for pt to find comfortable position at night.  Percocet does help some when pt takes 2 tabs (5mg /325mg ). Pt is concerned for pneumonia. States he was given an incentive spirometer on 4/16 but admits to not using as frequently as he should.  Denies hx of blood clots or CAD.  Denies fever, n/v/d.    Past Medical History  Diagnosis Date  . Arthritis   . Asthma   . EtOH dependence     drinks 2-3 40s a day  . Fibula fracture     Dr Dion SaucierLandau 02-2011  . Hernia     surgery 01-17-13   Past Surgical History  Procedure Laterality Date  . Ankle surgery    . Inguinal hernia repair Right 01/19/2013    Procedure: HERNIA REPAIR INGUINAL ADULT;  Surgeon: Atilano InaEric M Wilson, MD,FACS;  Location: [facility] OR;  Service: General;  Laterality: Right;  . Tibia im nail insertion Left 01/22/2013    Procedure: INTRAMEDULLARY (IM) NAIL TIBIAL;  Surgeon: Eulas PostJoshua P Landau, MD;  Location: [facility] OR;  Service: Orthopedics;  Laterality: Left;   Family History  Problem Relation Age of Onset  . Diabetes Mother   . Diabetes Father   . Hypertension Father    History  Substance Use Topics  . Smoking status: Current Every Day Smoker -- 1.00 packs/day    Types: Cigarettes  .  Smokeless tobacco: Not on file     Comment: 1 ppd  . Alcohol Use: 8.4 oz/week    14 Cans of beer per week     Comment: occasionally; generally drinks (2) 40s/day    Review of Systems  Constitutional: Negative for fever, chills and fatigue.  Respiratory: Positive for cough and shortness of breath.   Cardiovascular: Positive for chest pain ( left side and left lower rib). Negative for palpitations and leg swelling.  All other systems reviewed and are negative.     Allergies  Review of patient's allergies indicates no known allergies.  Home Medications   Prior to Admission medications   Medication Sig Start Date End Date Taking? Authorizing Provider  aspirin EC 325 MG tablet Take 650 mg by mouth daily as needed (pain).     Historical Provider, MD  B Complex Vitamins (VITAMIN B-COMPLEX PO) Take 1 tablet by mouth daily.    Historical Provider, MD  cyclobenzaprine (FLEXERIL) 10 MG tablet Take 1 tablet (10 mg total) by mouth 2 (two) times daily as needed for muscle spasms. 03/21/14   Emilia BeckKaitlyn Szekalski, PA-C  oxyCODONE-acetaminophen (PERCOCET/ROXICET) 5-325 MG per tablet Take 1-2 tablets by mouth every 4 (four) hours as needed for severe pain. 03/10/14   Shari A Upstill, PA-C  oxyCODONE-acetaminophen (PERCOCET/ROXICET) 5-325 MG per tablet  Take 2 tablets by mouth every 4 (four) hours as needed for severe pain. 03/21/14   Kaitlyn Szekalski, PA-C   BP 110/58  Pulse 82  Temp(Src) 98.7 F (37.1 C) (Oral)  Resp 17  Ht 6\' 1"  (1.854 m)  Wt 173 lb (78.472 kg)  BMI 22.83 kg/m2  SpO2 96% Physical Exam  Nursing note and vitals reviewed. Constitutional: He appears well-developed and well-nourished.  HENT:  Head: Normocephalic and atraumatic.  Eyes: Conjunctivae are normal. No scleral icterus.  Neck: Normal range of motion.  Cardiovascular: Normal rate, regular rhythm and normal heart sounds.   Pulmonary/Chest: Effort normal and breath sounds normal. No respiratory distress. He has no wheezes. He  has no rales. He exhibits tenderness ( left anterior chest wall, left lower ribs 10 and 11).  No respiratory distress, able to speak in full sentences w/o difficulty. Lungs: CTAB.  Left sided chest wall tenderness.  Abdominal: Soft. Bowel sounds are normal. He exhibits no distension and no mass. There is no tenderness. There is no rebound and no guarding.  Musculoskeletal: Normal range of motion.  Neurological: He is alert.  Skin: Skin is warm and dry.    ED Course  Procedures (including critical care time) Labs Review Labs Reviewed - No data to display  Imaging Review Dg Chest 2 View  03/23/2014   CLINICAL DATA:  Rib fracture with chest pain and shortness of breath starting this morning. Hypertension. Smoker.  EXAM: CHEST  2 VIEW  COMPARISON:  03/21/2014  FINDINGS: Hyperinflation. Displaced left tenth rib fracture, as before. Numerous leads and wires project over the chest. Normal heart size and mediastinal contours for age. Right-sided rib fractures which are likely remote. No pleural effusion or pneumothorax. Lower lobe predominant bronchial wall thickening. Lateral right lower lobe airspace disease similar back to 03/10/2014.  IMPRESSION: No significant change since 03/21/2014.  Right lower lobe airspace disease, suspicious for pneumonia. Recommend radiographic follow-up until clearing.  Bilateral rib fractures, including a subacute left tenth rib fracture.   Electronically Signed   By: Jeronimo GreavesKyle  Talbot M.D.   On: 03/23/2014 15:32     EKG Interpretation  Date: 03/23/2014  Rate: 53  Rhythm: normal sinus rhythm  QRS Axis: normal  Intervals: normal  ST/T Wave abnormalities: normal  Conduction Disutrbances:none  Narrative Interpretation: sinus rhythm, 1 ventricular premature complex, no significant change since last tracing. Reviewed by Dr. Ethelda ChickJacubowitz.  Old EKG Reviewed: unchanged        MDM   Final diagnoses:  Right lower lobe pneumonia  Chest pain  History of rib fracture    Pt  c/o of left sided chest pain in addition to left rib pain from rib fractures on 03/10/14 from fall.  Medical records reviewed, CXR on 4/16 was unchanged from initial CXR on 03/10/14.  Low concern for ACS or PE as pain is reproducible with palpation and deep breathing during heart and lung exam.    Vitals: 100% on RA. Normal pulse and resp.    CXR: no significant change since 03/21/14.  RLL airspace disease, suspicious for pneumonia.  Recommend radiographic f/u until clearing. Bilateral rib fx, including a subacute left tenth rib fracture.  Discussed pt with Dr. Ethelda ChickJacubowitz who also examined pt.  Will tx for pneumonia with azithromycin. Provided home instructions for incentive spirometry.  Advised [facility]. Return precautions provided. Pt verbalized understanding and agreement with tx plan.     Junius Finnerrin O'Malley, PA-C 03/23/14 1555

## 2015-03-02 ENCOUNTER — Encounter (HOSPITAL_COMMUNITY): Payer: Self-pay | Admitting: Emergency Medicine

## 2015-03-02 ENCOUNTER — Emergency Department (HOSPITAL_COMMUNITY): Payer: Medicare Other

## 2015-03-02 ENCOUNTER — Emergency Department (HOSPITAL_COMMUNITY)
Admission: EM | Admit: 2015-03-02 | Discharge: 2015-03-02 | Disposition: A | Discharge status: Home / Self Care | Payer: Medicare Other | Attending: Emergency Medicine | Admitting: Emergency Medicine

## 2015-03-02 DIAGNOSIS — R109 Unspecified abdominal pain: Secondary | ICD-10-CM | POA: Insufficient documentation

## 2015-03-02 DIAGNOSIS — Z791 Long term (current) use of non-steroidal anti-inflammatories (NSAID): Secondary | ICD-10-CM | POA: Diagnosis not present

## 2015-03-02 DIAGNOSIS — Z720 Tobacco use: Secondary | ICD-10-CM | POA: Diagnosis not present

## 2015-03-02 DIAGNOSIS — R000 Tachycardia, unspecified: Secondary | ICD-10-CM | POA: Diagnosis not present

## 2015-03-02 DIAGNOSIS — J189 Pneumonia, unspecified organism: Secondary | ICD-10-CM

## 2015-03-02 DIAGNOSIS — Z8781 Personal history of (healed) traumatic fracture: Secondary | ICD-10-CM | POA: Insufficient documentation

## 2015-03-02 DIAGNOSIS — J45909 Unspecified asthma, uncomplicated: Secondary | ICD-10-CM | POA: Insufficient documentation

## 2015-03-02 DIAGNOSIS — M545 Low back pain, unspecified: Secondary | ICD-10-CM

## 2015-03-02 DIAGNOSIS — J159 Unspecified bacterial pneumonia: Secondary | ICD-10-CM | POA: Diagnosis not present

## 2015-03-02 DIAGNOSIS — R918 Other nonspecific abnormal finding of lung field: Secondary | ICD-10-CM

## 2015-03-02 DIAGNOSIS — Z79899 Other long term (current) drug therapy: Secondary | ICD-10-CM | POA: Insufficient documentation

## 2015-03-02 LAB — BASIC METABOLIC PANEL
Anion gap: 10 (ref 5–15)
BUN: 6 mg/dL (ref 6–23)
CALCIUM: 9 mg/dL (ref 8.4–10.5)
CO2: 29 mmol/L (ref 19–32)
Chloride: 98 mmol/L (ref 96–112)
Creatinine, Ser: 0.87 mg/dL (ref 0.50–1.35)
GFR calc Af Amer: >90 mL/min (ref >90)
GFR calc non Af Amer: >90 mL/min (ref >90)
Glucose, Bld: 126 mg/dL — ABNORMAL HIGH (ref 70–99)
POTASSIUM: 4.4 mmol/L (ref 3.5–5.1)
Sodium: 137 mmol/L (ref 135–145)

## 2015-03-02 LAB — CBC WITH DIFFERENTIAL/PLATELET
BASOS ABS: 0 10*3/uL (ref 0.0–0.1)
Basophils Relative: 0 % (ref 0–1)
Eosinophils Absolute: 0.1 10*3/uL (ref 0.0–0.7)
Eosinophils Relative: 1 % (ref 0–5)
HEMATOCRIT: 39.8 % (ref 39.0–52.0)
Hemoglobin: 13.5 g/dL (ref 13.0–17.0)
LYMPHS PCT: 11 % — AB (ref 12–46)
Lymphs Abs: 0.9 10*3/uL (ref 0.7–4.0)
MCH: 29.8 pg (ref 26.0–34.0)
MCHC: 33.9 g/dL (ref 30.0–36.0)
MCV: 87.9 fL (ref 78.0–100.0)
MONOS PCT: 14 % — AB (ref 3–12)
Monocytes Absolute: 1.2 10*3/uL — ABNORMAL HIGH (ref 0.1–1.0)
NEUTROS ABS: 6.1 10*3/uL (ref 1.7–7.7)
NEUTROS PCT: 74 % (ref 43–77)
Platelets: 265 10*3/uL (ref 150–400)
RBC: 4.53 MIL/uL (ref 4.22–5.81)
RDW: 15.6 % — ABNORMAL HIGH (ref 11.5–15.5)
WBC: 8.3 10*3/uL (ref 4.0–10.5)

## 2015-03-02 LAB — URINALYSIS, ROUTINE W REFLEX MICROSCOPIC
Glucose, UA: NEGATIVE mg/dL
Hgb urine dipstick: NEGATIVE
Ketones, ur: 15 mg/dL — AB
Nitrite: NEGATIVE
PH: 5.5 (ref 5.0–8.0)
PROTEIN: NEGATIVE mg/dL
SPECIFIC GRAVITY, URINE: 1.023 (ref 1.005–1.030)
UROBILINOGEN UA: 1 mg/dL (ref 0.0–1.0)

## 2015-03-02 LAB — URINE MICROSCOPIC-ADD ON

## 2015-03-02 MED ORDER — OXYCODONE-ACETAMINOPHEN 5-325 MG PO TABS
2.0000 | ORAL_TABLET | Freq: Once | ORAL | Status: AC
  Administered 2015-03-02: 2 via ORAL
  Filled 2015-03-02: qty 2

## 2015-03-02 MED ORDER — IOHEXOL 350 MG/ML SOLN
80.0000 mL | Freq: Once | INTRAVENOUS | Status: AC | PRN
  Administered 2015-03-02: 80 mL via INTRAVENOUS

## 2015-03-02 MED ORDER — LORAZEPAM 1 MG PO TABS
1.0000 mg | ORAL_TABLET | Freq: Once | ORAL | Status: AC
  Administered 2015-03-02: 1 mg via ORAL
  Filled 2015-03-02: qty 1

## 2015-03-02 MED ORDER — LEVOFLOXACIN 750 MG PO TABS
750.0000 mg | ORAL_TABLET | Freq: Once | ORAL | Status: AC
  Administered 2015-03-02: 750 mg via ORAL
  Filled 2015-03-02: qty 1

## 2015-03-02 MED ORDER — LEVOFLOXACIN 750 MG PO TABS: 750.0000 mg | ORAL_TABLET | Freq: Every day | ORAL | Status: DC

## 2015-03-02 MED ORDER — IBUPROFEN 800 MG PO TABS
800.0000 mg | ORAL_TABLET | Freq: Once | ORAL | Status: AC
  Administered 2015-03-02: 800 mg via ORAL
  Filled 2015-03-02: qty 1

## 2015-03-02 NOTE — ED Provider Notes (Signed)
CSN: [id number]     Arrival date & time 03/02/15  1101 History   First MD Initiated Contact with Patient 03/02/15 1116     Chief Complaint  Patient presents with  . Back Pain     (Consider location/radiation/quality/duration/timing/severity/associated sxs/prior Treatment) HPI Comments: Patient complains of a one-week history of lower back pain that is bilateral and atraumatic. Denies any lifting injury or falls. Pain is worse with movements and palpation. Denies any history of back problems. No focal weakness, numbness or tingling. No bowel or bladder incontinence. No fever or vomiting. Pain does not radiate down buttocks or legs. No abdominal pain or chest pain. No urinary symptoms. no hematuria or dysuria. He's been taking aspirin at home without relief.  The history is provided by the patient.    Past Medical History  Diagnosis Date  . Arthritis   . Asthma   . EtOH dependence     drinks 2-3 40s a day  . Fibula fracture     Dr Mardelle Matte 02-2011  . Hernia     surgery 01-17-13   Past Surgical History  Procedure Laterality Date  . Ankle surgery    . Inguinal hernia repair Right 01/19/2013    Procedure: HERNIA REPAIR INGUINAL ADULT;  Surgeon: Gayland Curry, MD,FACS;  Location: [facility];  Service: General;  Laterality: Right;  . Tibia im nail insertion Left 01/22/2013    Procedure: INTRAMEDULLARY (IM) NAIL TIBIAL;  Surgeon: Johnny Bridge, MD;  Location: [facility];  Service: Orthopedics;  Laterality: Left;   Family History  Problem Relation Age of Onset  . Diabetes Mother   . Diabetes Father   . Hypertension Father    History  Substance Use Topics  . Smoking status: Current Every Day Smoker -- 1.00 packs/day    Types: Cigarettes  . Smokeless tobacco: Not on file     Comment: 1 ppd  . Alcohol Use: 8.4 oz/week    14 Cans of beer per week     Comment: occasionally; generally drinks (2) 40s/day    Review of Systems  Constitutional: Negative for fever, activity change and appetite  change.  Respiratory: Negative for cough, chest tightness and shortness of breath.   Cardiovascular: Negative for chest pain.  Gastrointestinal: Negative for nausea, vomiting and abdominal pain.  Genitourinary: Positive for flank pain. Negative for dysuria and hematuria.  Musculoskeletal: Positive for back pain.  Skin: Negative for rash.  Neurological: Negative for dizziness, weakness and light-headedness.  A complete 10 system review of systems was obtained and all systems are negative except as noted in the HPI and PMH.      Allergies  Review of patient's allergies indicates no known allergies.  Home Medications   Prior to Admission medications   Medication Sig Start Date End Date Taking? Authorizing Provider  ibuprofen (ADVIL,MOTRIN) 200 MG tablet Take 400 mg by mouth every 6 (six) hours as needed (pain).   Yes Historical Provider, MD  VITAMIN E PO Take 1 capsule by mouth 2 (two) times daily.   Yes Historical Provider, MD  cyclobenzaprine (FLEXERIL) 10 MG tablet Take 1 tablet (10 mg total) by mouth 2 (two) times daily as needed for muscle spasms. Patient not taking: Reported on 03/02/2015 03/21/14   Alvina Chou, PA-C  levofloxacin (LEVAQUIN) 750 MG tablet Take 1 tablet (750 mg total) by mouth daily. 03/02/15   Ezequiel Essex, MD  meloxicam (MOBIC) 7.5 MG tablet Take 2 tablets (15 mg total) by mouth daily. Patient not taking: Reported on  03/02/2015 03/23/14   Noland Fordyce, PA-C  oxyCODONE-acetaminophen (PERCOCET/ROXICET) 5-325 MG per tablet Take 1-2 tablets by mouth every 4 (four) hours as needed for severe pain. Patient not taking: Reported on 03/02/2015 03/10/14   Charlann Lange, PA-C   BP 145/80 mmHg  Pulse 96  Temp(Src) 98.4 F (36.9 C) (Oral)  Resp 20  Ht _0  (1.854 m)  Wt 175 lb (79.379 kg)  BMI 23.09 kg/m2  SpO2 98% Physical Exam  Constitutional: He is oriented to person, place, and time. He appears well-developed and well-nourished. No distress.  HENT:  Head:  Normocephalic and atraumatic.  Mouth/Throat: Oropharynx is clear and moist. No oropharyngeal exudate.  Eyes: Conjunctivae and EOM are normal. Pupils are equal, round, and reactive to light.  Neck: Normal range of motion. Neck supple.  No meningismus.  Cardiovascular: Normal rate, normal heart sounds and intact distal pulses.   No murmur heard. Mild tachycardia  Pulmonary/Chest: Effort normal and breath sounds normal. No respiratory distress.  Abdominal: Soft. There is no tenderness. There is no rebound and no guarding.  Musculoskeletal: Normal range of motion. He exhibits tenderness. He exhibits no edema.  Paraspinal lumbar tenderness No midline tenderness  Neurological: He is alert and oriented to person, place, and time. No cranial nerve deficit. He exhibits normal muscle tone. Coordination normal.  No ataxia on finger to nose bilaterally. No pronator drift. 5/5 strength throughout. CN 2-12 intact. Negative Romberg. Equal grip strength. Sensation intact. Gait is normal.   Skin: Skin is warm.  Psychiatric: He has a normal mood and affect. His behavior is normal.  Nursing note and vitals reviewed.   ED Course  Procedures (including critical care time) Labs Review Labs Reviewed  URINALYSIS, ROUTINE W REFLEX MICROSCOPIC - Abnormal; Notable for the following:    Color, Urine ORANGE (*)    APPearance CLOUDY (*)    Bilirubin Urine MODERATE (*)    Ketones, ur 15 (*)    Leukocytes, UA TRACE (*)    All other components within normal limits  CBC WITH DIFFERENTIAL/PLATELET - Abnormal; Notable for the following:    RDW 15.6 (*)    Lymphocytes Relative 11 (*)    Monocytes Relative 14 (*)    Monocytes Absolute 1.2 (*)    All other components within normal limits  BASIC METABOLIC PANEL - Abnormal; Notable for the following:    Glucose, Bld 126 (*)    All other components within normal limits  URINE MICROSCOPIC-ADD ON    Imaging Review Ct Abdomen Pelvis Wo Contrast  03/02/2015    CLINICAL DATA:  54 year old male with a history of back and flank pain for 1 week. No bleeding.  EXAM: CT ABDOMEN AND PELVIS WITHOUT CONTRAST  TECHNIQUE: Multidetector CT imaging of the abdomen and pelvis was performed following the standard protocol without IV contrast.  COMPARISON:  CT abdomen 03/10/2013, 03/10/2014.  CT chest 03/11/2013  FINDINGS: Lower chest:  Unremarkable appearance of the soft tissues of the chest wall.  Heart size within normal limits. No pericardial fluid/ thickening. Small hiatal hernia.  Nodular/ linear opacities of the lingula and right middle lobe in a peribronchovascular distribution. Nodular opacity at the periphery of the right middle lobe was present on CT chest 03/11/2013. This had improved on the CT abdomen 03/10/2014. Nodule at the base of the right medial lung, paraspinal location measures 2.8 cm x 1.4 cm. No pleural effusion.  Abdomen/ pelvis:  Unremarkable appearance of liver, spleen, pancreas, gallbladder. Unremarkable bilateral adrenal glands.  Small hiatal hernia.  No left or right-sided hydronephrosis. No perinephric stranding or inflammatory changes. Unremarkable course of the bilateral ureters with no stones identified.  No abnormally distended small bowel or colon.  Fluid filled cecum.  Appendix not visualized, though there are no inflammatory changes at the cecum to suggest inflammatory process.  No free fluid or free intraperitoneal air. No significant inflammatory changes.  Scattered atherosclerotic calcifications, mainly of the lower aorta and iliofemoral systems.  Musculoskeletal:  No displaced fracture.  No aggressive bone lesions identified.  Mild degenerative changes of the visualized spine.  Fat containing bilateral left hernia.  IMPRESSION: No evidence of nephrolithiasis, hydronephrosis, with no other evidence of acute intra abdominal process.  Partially imaged nodular density at the medial right lower lobe, with peri-bronchovascular changes partially  visualized within the right middle lobe and lingula. These may reflect a inflammatory process and could contribute to pleurisy symptoms. Further evaluation with chest CT is recommended given findings on the CT dated 03/11/2013.  Signed,  Dulcy Fanny. Earleen Newport, DO  [facility]  [facility]   Electronically Signed   By: Corrie Mckusick D.O.   On: 03/02/2015 14:33   Dg Lumbar Spine Complete  03/02/2015   CLINICAL DATA:  54 year old male with a history of lumbar back pain  EXAM: LUMBAR SPINE - COMPLETE 4+ VIEW  COMPARISON:  CT 03/10/2014  FINDINGS: Lumbar Spine:  Lumbar vertebral elements maintain normal alignment without evidence of anterolisthesis, retrolisthesis, subluxation.  No fracture line identified. Vertebral body heights maintained as well as disc space heights.  No significant degenerative disc disease or endplate changes. No significant facet changes.  Unremarkable appearance of the visualized abdomen.  Oblique images demonstrate no displaced pars defect.  Pedicles of L4 on L5 are somewhat short.  Atherosclerosis.  IMPRESSION: No radiographic evidence of acute fracture or malalignment of the lumbar spine.  Questionable element of congenital canal narrowing as there are short pedicles of L4 on L5.  Atherosclerosis.  Signed,  Dulcy Fanny. Earleen Newport, DO  [facility]  [facility]   Electronically Signed   By: Corrie Mckusick D.O.   On: 03/02/2015 13:13   Ct Angio Chest Pe W/cm &/or Wo Cm  03/02/2015   CLINICAL DATA:  Left chest pain, right lower lobe possible nodule on prior exam  EXAM: CT ANGIOGRAPHY CHEST WITH CONTRAST  TECHNIQUE: Multidetector CT imaging of the chest was performed using the standard protocol during bolus administration of intravenous contrast. Multiplanar CT image reconstructions and MIPs were obtained to evaluate the vascular anatomy.  CONTRAST:  45m OMNIPAQUE IOHEXOL 350 MG/ML SOLN  COMPARISON:  CT  abdomen/pelvis 03/02/2015. Most recent chest radiograph 03/23/2014  FINDINGS: Mediastinum/Nodes: The examination is adequate for evaluation for acute pulmonary embolism up to and including the 3rd order pulmonary arteries. No focal filling defect is identified up to and including the 3rd order pulmonary arteries to suggest acute pulmonary embolism.  Great vessels are normal in caliber. Heart size is normal. No pericardial effusion. No pleural effusion. No lymphadenopathy.  Lungs/Pleura: Multiple ill-defined nodular opacities are identified throughout both lungs, many of which with internal air bronchogram formation and some with possible partial internal cavitation. There are also areas of patchy tree-in-bud type nodular airspace opacity. Again noted is a right lower lobe pleural parenchymal mass measuring 3.0 x 1.9 cm image 63. Within the right lung apex, there is nodular focal parenchymal consolidation measuring 4.2 x 2.6 cm image 16. No central airway filling defect is identified. No pleural effusion.  Upper abdomen: Normal  Musculoskeletal: Remote left rib fractures are noted. No acute osseous abnormality.  Review of the MIP images confirms the above findings.  IMPRESSION: Irregular nodular diffuse pulmonary parenchymal areas of consolidation with areas of tree-in-bud type nodular opacity. Overall, the appearance is most typical for multi focal pneumonia. However, neoplasm could appear similar and could be obscured or mimicked by inflammatory/infectious change. Other etiologies including bland or septic emboli are possible but less likely given the appearance. Metastatic disease is also possible. Followup chest CT is recommended in 6-8 weeks presumably after treatment, for further evaluation.   Electronically Signed   By: Conchita Paris M.D.   On: 03/02/2015 15:47     EKG Interpretation None      MDM   Final diagnoses:  Bilateral low back pain without sciatica  Lung nodules  CAP (community acquired  pneumonia)   Atraumatic paraspinal back pain. No motor or sensory deficits. No urinary symptoms.  Patient mild tachycardia. Denies chest pain or shortness of breath. No hypoxia. Urinalysis shows no blood or infection. X-rays negative for fracture or dislocation.  Question whether tachycardia may be due to patient's possible early alcohol withdrawal. States he drinks 2 40 ounces a day and last drink was yesterday. No tremors.  CT findings discussed with patient. Nodular diffuse pulmonary densities concerning for multifocal pneumonia. No PE. Also concern for possible neoplasm versus other inflammatory change. Follow-up CT recommended in 6-8 weeks.  Patient does not have PCP. We'll treat empirically for pneumonia. Discussed with case manager to ensure appropriate follow-up.  patient understands this could represent cancer a needs repeat imaging in 6-8 weeks. HR has improved.  BP 145/80 mmHg  Pulse 96  Temp(Src) 98.4 F (36.9 C) (Oral)  Resp 20  Ht _0  (1.854 m)  Wt 175 lb (79.379 kg)  BMI 23.09 kg/m2  SpO2 98%    Mariann Laster has met with patient and scheduled him for appointment at [facility] on 3/[facility].  Ezequiel Essex, MD 03/02/15 [phone]

## 2015-03-02 NOTE — ED Notes (Signed)
Patient transported to X-ray 

## 2015-03-02 NOTE — ED Notes (Signed)
Pain in bilateral lower back/flank area for a week. No injury that he is aware of. Chronic cough. Increases with movements such as turning in bed or walking. Denies urinary symptoms.

## 2015-03-02 NOTE — ED Notes (Signed)
Wanda from Case Management at bedside. 

## 2015-03-02 NOTE — Discharge Instructions (Signed)
Pneumonia  Take the antibiotics as prescribed. You need to have a repeat CT scan of her chest in 6-8 weeks. Establish care with a primary physician. Return to the ED if you develop new or worsening symptoms. Pneumonia is an infection of the lungs.  CAUSES Pneumonia may be caused by bacteria or a virus. Usually, these infections are caused by breathing infectious particles into the lungs (respiratory tract). SIGNS AND SYMPTOMS   Cough.  Fever.  Chest pain.  Increased rate of breathing.  Wheezing.  Mucus production. DIAGNOSIS  If you have the common symptoms of pneumonia, your health care provider will typically confirm the diagnosis with a chest X-ray. The X-ray will show an abnormality in the lung (pulmonary infiltrate) if you have pneumonia. Other tests of your blood, urine, or sputum may be done to find the specific cause of your pneumonia. Your health care provider may also do tests (blood gases or pulse oximetry) to see how well your lungs are working. TREATMENT  Some forms of pneumonia may be spread to other people when you cough or sneeze. You may be asked to wear a mask before and during your exam. Pneumonia that is caused by bacteria is treated with antibiotic medicine. Pneumonia that is caused by the influenza virus may be treated with an antiviral medicine. Most other viral infections must run their course. These infections will not respond to antibiotics.  HOME CARE INSTRUCTIONS   Cough suppressants may be used if you are losing too much rest. However, coughing protects you by clearing your lungs. You should avoid using cough suppressants if you can.  Your health care provider may have prescribed medicine if he or she thinks your pneumonia is caused by bacteria or influenza. Finish your medicine even if you start to feel better.  Your health care provider may also prescribe an expectorant. This loosens the mucus to be coughed up.  Take medicines only as directed by your  health care provider.  Do not smoke. Smoking is a common cause of bronchitis and can contribute to pneumonia. If you are a smoker and continue to smoke, your cough may last several weeks after your pneumonia has cleared.  A cold steam vaporizer or humidifier in your room or home may help loosen mucus.  Coughing is often worse at night. Sleeping in a semi-upright position in a recliner or using a couple pillows under your head will help with this.  Get rest as you feel it is needed. Your body will usually let you know when you need to rest. PREVENTION A pneumococcal shot (vaccine) is available to prevent a common bacterial cause of pneumonia. This is usually suggested for:  People over 55 years old.  Patients on chemotherapy.  People with chronic lung problems, such as bronchitis or emphysema.  People with immune system problems. If you are over 65 or have a high risk condition, you may receive the pneumococcal vaccine if you have not received it before. In some countries, a routine influenza vaccine is also recommended. This vaccine can help prevent some cases of pneumonia.You may be offered the influenza vaccine as part of your care. If you smoke, it is time to quit. You may receive instructions on how to stop smoking. Your health care provider can provide medicines and counseling to help you quit. SEEK MEDICAL CARE IF: You have a fever. SEEK IMMEDIATE MEDICAL CARE IF:   Your illness becomes worse. This is especially true if you are elderly or weakened from any other  disease.  You cannot control your cough with suppressants and are losing sleep.  You begin coughing up blood.  You develop pain which is getting worse or is uncontrolled with medicines.  Any of the symptoms which initially brought you in for treatment are getting worse rather than better.  You develop shortness of breath or chest pain. MAKE SURE YOU:   Understand these instructions.  Will watch your  condition.  Will get help right away if you are not doing well or get worse. Document Released: 11/22/2005 Document Revised: 04/08/2014 Document Reviewed: 02/11/2011 [facility] Patient Information 2015 [place], [place]. This information is not intended to replace advice given to you by your health care provider. Make sure you discuss any questions you have with your health care provider.   Emergency Department Resource Guide 1) Find a Doctor and Pay Out of Pocket Although you won't have to find out who is covered by your insurance plan, it is a good idea to ask around and get recommendations. You will then need to call the office and see if the doctor you have chosen will accept you as a new patient and what types of options they offer for patients who are self-pay. Some doctors offer discounts or will set up payment plans for their patients who do not have insurance, but you will need to ask so you aren't surprised when you get to your appointment.  2) Contact Your Local Health Department Not all health departments have doctors that can see patients for sick visits, but many do, so it is worth a call to see if yours does. If you don't know where your local health department is, you can check in your phone book. The CDC also has a tool to help you locate your state's health department, and many state websites also have listings of all of their local health departments.  3) Find a Walk-in Clinic If your illness is not likely to be very severe or complicated, you may want to try a walk in clinic. These are popping up all over the country in pharmacies, drugstores, and shopping centers. They're usually staffed by nurse practitioners or physician assistants that have been trained to treat common illnesses and complaints. They're usually fairly quick and inexpensive. However, if you have serious medical issues or chronic medical problems, these are probably not your best option.  No Primary Care  Doctor: - Call Health Connect at  [phone] - they can help you locate a primary care doctor that  accepts your insurance, provides certain services, etc. - Physician Referral Service- [phone]  Chronic Pain Problems: Organization         Address  Phone   Notes  [facility]  [phone] Patients need to be referred by their primary care doctor.   Medication Assistance: Organization         Address  Phone   Notes  [facility] Medication [facility] [address] [place] [place], [place] [address] [phone] --Must be a resident of [facility] -- Must have NO insurance coverage whatsoever (no Medicaid/ Medicare, etc.) -- The pt. MUST have a primary care doctor that directs their care regularly and follows them in the community   MedAssist  [phone]   [employer]  [phone]    Agencies that provide inexpensive medical care: Organization         Address  Phone   Notes  [facility]  [phone]  [facility]    [phone]   [facility] [place], [place] [address] [phone]   [place]. [address], [place] [phone]   Planned Parenthood    [phone]   [facility]    [phone]   [place] and [place] [address], [place] Phone:  [phone], Fax:  [phone] Hours of Operation:  9 am - 6 pm, M-F.  Also accepts Medicaid/Medicare and self-pay.  [facility] for [place], [address], [place] Phone: [phone], Fax: [phone]. Hours of Operation:  8:30 am - 5:30 pm, M-F.  Also accepts Medicaid and self-pay.  [facility] [place] [address] [place] Phone: [phone]   [place], [place], [place] [phone], Ext. [phone] Mondays & Thursdays: 7-9 AM.  First 15 patients are seen on a first  come, first serve basis.    [place] Providers:  Organization         Address  Phone   Notes  [facility] [address], [place] [phone] Also accepts self-pay patients.  [facility] [address], [place]  [phone]   [place], [address], [place] [phone]   [facility] [address], [place] [phone]   Lucianne Lei [address], [place]   [phone] Only accepts [place] Access [place] patients after they have their name applied to their card.   Self-Pay (no insurance) in [facility]:  Organization         Address  Phone   Notes  Sickle Cell Patients, [facility] [place] [phone]   [facility] [place] [phone]   [facility] [place]  [place], [place], [place] [phone]   [facility]/Dr. Osei-Bonsu  [address], [place] or [address], [place] [phone] Phone number for both [place] and [place] locations is the same.  Urgent Medical and [facility] [address] [place] [phone]   [facility] [address] [place] or [address] [phone]   [facility] [place] [address] [place] [phone], phone; [phone], fax Sees patients 1st and 3rd Saturday of every month.  Must not qualify for public or private insurance (i.e. Medicaid, Medicare,  Health Choice, Veterans' Benefits)  Household income should be no more than 200% of the poverty level The clinic cannot treat you if you are pregnant or think you are pregnant  Sexually transmitted diseases are not treated at the clinic.    Dental Care: Organization          Address  Phone  Notes  [facility] Department of [facility] [place] [phone] Accepts children up to age 37 who are enrolled in [place] or [place]; pregnant women with a Medicaid card; and children who have applied for Medicaid or  Health Choice, but were declined, whose parents can pay a reduced fee at time of service.  [facility] Department of [facility]  [address],  [place] [phone] Accepts children up to age 97 who are enrolled in Medicaid or [place] Health Choice; pregnant women with a Medicaid card; and children who have applied for Medicaid or [place] Health Choice, but were declined, whose parents can pay a reduced fee at time of service.  [place] Adult [facility] Access PROGRAM  [place] [phone] Patients are seen by appointment only. Walk-ins are not accepted. [place] will see patients 6 years of age and older. Monday - Tuesday (8am-5pm) Most Wednesdays (8:30-5pm) $30 per visit, cash only  [facility] Adult Dental Access PROGRAM  [address], [facility] [phone] Patients are seen by appointment only. Walk-ins are not accepted. [place] will see patients 70 years of age and older. One Wednesday Evening (Monthly: Volunteer Based).  $30 per visit, cash only  [place]  [phone] for adults; Children under age 35, call Graduate Pediatric Dentistry at [phone]. Children aged 36-14, please call [phone] to request a pediatric application.  Dental services are provided in all areas of dental care including fillings, crowns and bridges, complete and partial dentures, implants, gum treatment, root canals, and extractions. Preventive care is also provided. Treatment is provided to both adults and children. Patients are selected via a lottery and there is often a waiting list.   [facility] [address], [place]  [phone] [email or web address]   [facility] [address] [place], [place] [phone], Ext. [phone] Second and Fourth Thursday of each month, opens at 6:30 AM; Clinic ends at 9 AM.  Patients are seen on a first-come first-served basis, and a limited number are seen during each clinic.   [facility]  [address] Hillard Danker [place], [place] [phone]   Eligibility Requirements You must have lived in [place], [place], or [place] counties for at least the last three months.   You cannot be eligible for state or federal sponsored [employer], including [employer], [place], or [employer].   You generally cannot be eligible for healthcare insurance through your employer.    How to apply: Eligibility screenings are held every Tuesday and Wednesday afternoon from 1:00 pm until 4:00 pm. You do not need an appointment for the interview!  [facility] [address] [place], [place]   [place]  [place] Department  [place]  [phone]    Behavioral Health Resources in the Community: Intensive Outpatient Programs Organization         Address  Phone  Notes  [place]. [address] [place], [place] [phone]   [facility] Outpatient [address] [place], [place]   ADS: Alcohol & Drug Sv[facility] [address] [place], [place]   [place] [address]  [place], [place] or [phone]   Substance Abuse Resources Organization         Address  Phone  Notes  Alcohol and Drug Services  [phone]   [place]  [phone]   [facility] [place]   Chinita Pester  [phone]   Residential & Outpatient Substance Abuse Program  [phone]   Psychological  Services Organization         Address  Phone  Notes  [place]  [place]  [phone]-  [phone] [address] [place] or [phone]    Mobile Crisis Teams Organization         Address  Phone  Notes  [facility], Mobile Crisis Care Unit  [phone]   [facility]  [address] [place], [place]   [place] Levels [address], [place] [phone]    Self-Help/Support Groups Organization         Address  Phone             Notes  [place]. of [place] - variety of support groups  [place] Call for more information  Narcotics Anonymous (NA), [facility] [address], [employer] [place]  2 meetings at this location   Special educational needs teacher         Address  Phone  Notes  ASAP Residential Treatment [place],    [place]  [phone]   [facility]  [address], [place] [address], [place], [place]   [place], [place] [phone] Admissions: 8am-3pm M-F  Incentives Substance [place] [address]    [place], [place] [phone]   [facility] [address] [place], [place], [place]   [facility] [address],  [place], [place]   [facility] - Intensive Outpatient [address] Kristeen Mans [address], [place], [place]   [facility] ([place].,  [place], [place] [phone] or [phone]   [facility] Services (RTS) [address] [place], [place] Accepts Medicaid  [facility] [place] [address],  [place] [phone] Substance Abuse/Addiction Treatment   [facility] Organization         Address  Phone  Notes  [facility]  [phone]   Domenic Schwab, PhD [address] Arlis Porta [place], [place]   [phone] or [phone]   [place], [place] [phone]   [facility] [address], [place], [place] [phone] Insurance/Medicaid/sponsorship through [facility] and Families [address] [place]                                    [place], [place] [phone] [place] [address] [place] [address], [place] [phone]    Dr. Adele Schilder  [phone]   [facility] [place] Dept. 1) [address] [place] 2) [place] 3)  [place] 65, [place] [phone]  [phone]   [place] [phone] or [phone] (After Hours)

## 2015-03-02 NOTE — ED Notes (Signed)
Patient returned from X-ray 

## 2015-03-06 ENCOUNTER — Inpatient Hospital Stay: Payer: Medicaid Other | Admitting: Family Medicine

## 2015-04-06 ENCOUNTER — Emergency Department (HOSPITAL_COMMUNITY)
Admission: EM | Admit: 2015-04-06 | Discharge: 2015-04-06 | Disposition: A | Discharge status: Home / Self Care | Payer: Medicare Other | Attending: Emergency Medicine | Admitting: Emergency Medicine

## 2015-04-06 ENCOUNTER — Encounter (HOSPITAL_COMMUNITY): Payer: Self-pay | Admitting: Emergency Medicine

## 2015-04-06 DIAGNOSIS — M1990 Unspecified osteoarthritis, unspecified site: Secondary | ICD-10-CM

## 2015-04-06 DIAGNOSIS — J45909 Unspecified asthma, uncomplicated: Secondary | ICD-10-CM | POA: Diagnosis not present

## 2015-04-06 DIAGNOSIS — Z720 Tobacco use: Secondary | ICD-10-CM | POA: Insufficient documentation

## 2015-04-06 DIAGNOSIS — Z791 Long term (current) use of non-steroidal anti-inflammatories (NSAID): Secondary | ICD-10-CM | POA: Insufficient documentation

## 2015-04-06 DIAGNOSIS — M25571 Pain in right ankle and joints of right foot: Secondary | ICD-10-CM | POA: Insufficient documentation

## 2015-04-06 DIAGNOSIS — M25562 Pain in left knee: Secondary | ICD-10-CM | POA: Insufficient documentation

## 2015-04-06 DIAGNOSIS — Z8781 Personal history of (healed) traumatic fracture: Secondary | ICD-10-CM | POA: Diagnosis not present

## 2015-04-06 DIAGNOSIS — M25561 Pain in right knee: Secondary | ICD-10-CM | POA: Diagnosis present

## 2015-04-06 DIAGNOSIS — Z792 Long term (current) use of antibiotics: Secondary | ICD-10-CM | POA: Diagnosis not present

## 2015-04-06 DIAGNOSIS — Z8719 Personal history of other diseases of the digestive system: Secondary | ICD-10-CM | POA: Diagnosis not present

## 2015-04-06 DIAGNOSIS — Z79899 Other long term (current) drug therapy: Secondary | ICD-10-CM | POA: Insufficient documentation

## 2015-04-06 DIAGNOSIS — J302 Other seasonal allergic rhinitis: Secondary | ICD-10-CM

## 2015-04-06 DIAGNOSIS — M25572 Pain in left ankle and joints of left foot: Secondary | ICD-10-CM | POA: Insufficient documentation

## 2015-04-06 MED ORDER — LORATADINE-PSEUDOEPHEDRINE ER 5-120 MG PO TB12: 1.0000 | ORAL_TABLET | Freq: Two times a day (BID) | ORAL | Status: DC

## 2015-04-06 MED ORDER — MELOXICAM 15 MG PO TABS: 15.0000 mg | ORAL_TABLET | Freq: Every day | ORAL | Status: DC

## 2015-04-06 NOTE — ED Notes (Signed)
Pt from home for chronic bilateral knee and bilateral knee pain. Pt denies any new injury to the extremities. Pt also reports nasal congestion x1 month, denies any sob or cp at this time. nad noted.

## 2015-04-06 NOTE — Discharge Instructions (Signed)
Take mobic as needed for pain. Take Claritin daily for allergies. Refer to attached documents for more information.

## 2015-04-06 NOTE — ED Provider Notes (Signed)
CSN: [id number]     Arrival date & time 04/06/15  1647 History  This chart was scribed for non-physician provider Emilia BeckKaitlyn Sharlon Pfohl, PA-C, working with Richardean Canalavid H Yao, MD by Phillis HaggisGabriella Gaje, ED Scribe. This patient [facility] and patient care was started at 5:35 PM.   Chief Complaint  Patient presents with  . Knee Pain  . Ankle Pain   Patient is a 54 y.o. male presenting with knee pain and ankle pain. The history is provided by the patient. No language interpreter was used.  Knee Pain Ankle Pain   HPI Comments: Frederick [name] is a 54 y.o. male with a history of arthritis who presents to the Emergency Department complaining of bilateral knee and ankle pain. Patient reports chronic arthritis and sinus problems and states that when he has a flare up, everything flares up at the same time. He reports associated pain in his right shoulder, cough and sinus pain. He states that he has a rod in his left leg. He states that he took percocet and oxycodone for the pain to no relief. He states that he has been trying to get a PCP but has been unable to. He states that he had an appointment in March but was unable to make it because he was not feeling good. He reports that he was last diagnosed with pneumonia when he last had a chest x-ray. He denies any new injuries.   Past Medical History  Diagnosis Date  . Arthritis   . Asthma   . EtOH dependence     drinks 2-3 40s a day  . Fibula fracture     Dr Dion SaucierLandau 02-2011  . Hernia     surgery 01-17-13   Past Surgical History  Procedure Laterality Date  . Ankle surgery    . Inguinal hernia repair Right 01/19/2013    Procedure: HERNIA REPAIR INGUINAL ADULT;  Surgeon: Atilano InaEric M Wilson, MD,FACS;  Location: [facility] OR;  Service: General;  Laterality: Right;  . Tibia im nail insertion Left 01/22/2013    Procedure: INTRAMEDULLARY (IM) NAIL TIBIAL;  Surgeon: Eulas PostJoshua P Landau, MD;  Location: [facility] OR;  Service: Orthopedics;  Laterality: Left;   Family History   Problem Relation Age of Onset  . Diabetes Mother   . Diabetes Father   . Hypertension Father    History  Substance Use Topics  . Smoking status: Current Every Day Smoker -- 1.00 packs/day    Types: Cigarettes  . Smokeless tobacco: Not on file     Comment: 1 ppd  . Alcohol Use: 8.4 oz/week    14 Cans of beer per week     Comment: occasionally; generally drinks (2) 40s/day    Review of Systems  HENT: Positive for congestion.   Respiratory: Positive for cough. Negative for shortness of breath.   Cardiovascular: Negative for chest pain.  Musculoskeletal: Positive for arthralgias.  All other systems reviewed and are negative.  Allergies  Review of patient's allergies indicates no known allergies.  Home Medications   Prior to Admission medications   Medication Sig Start Date End Date Taking? Authorizing Provider  cyclobenzaprine (FLEXERIL) 10 MG tablet Take 1 tablet (10 mg total) by mouth 2 (two) times daily as needed for muscle spasms. Patient not taking: Reported on 03/02/2015 03/21/14   Emilia BeckKaitlyn Stanlee Roehrig, PA-C  ibuprofen (ADVIL,MOTRIN) 200 MG tablet Take 400 mg by mouth every 6 (six) hours as needed (pain).    Historical Provider, MD  levofloxacin (LEVAQUIN) 750 MG tablet  Take 1 tablet (750 mg total) by mouth daily. 03/02/15   Glynn Octave, MD  meloxicam (MOBIC) 7.5 MG tablet Take 2 tablets (15 mg total) by mouth daily. Patient not taking: Reported on 03/02/2015 03/23/14   Junius Finner, PA-C  oxyCODONE-acetaminophen (PERCOCET/ROXICET) 5-325 MG per tablet Take 1-2 tablets by mouth every 4 (four) hours as needed for severe pain. Patient not taking: Reported on 03/02/2015 03/10/14   [name], PA-C  VITAMIN E PO Take 1 capsule by mouth 2 (two) times daily.    Historical Provider, MD   BP 140/72 mmHg  Pulse 99  Temp(Src) 99 F (37.2 C) (Oral)  Resp 16  Ht  (1.854 m)  Wt 165 lb (74.844 kg)  BMI 21.77 kg/m2  SpO2 98%   Physical Exam  Constitutional: He is oriented to  person, place, and time. He appears well-developed and well-nourished. No distress.  HENT:  Head: Normocephalic and atraumatic.  Mouth/Throat: Oropharynx is clear and moist. No oropharyngeal exudate.  Eyes: Conjunctivae and EOM are normal.  Neck: Normal range of motion. Neck supple.  Cardiovascular: Normal rate, regular rhythm and normal heart sounds.   Pulmonary/Chest: Effort normal and breath sounds normal.  Abdominal: Soft. He exhibits no distension. There is no tenderness.  Musculoskeletal: Normal range of motion. He exhibits no edema or tenderness.  Neurological: He is alert and oriented to person, place, and time.  Skin: Skin is warm and dry.  Psychiatric: He has a normal mood and affect. His behavior is normal.  Nursing note and vitals reviewed.   ED Course  Procedures (including critical care time) DIAGNOSTIC STUDIES: Oxygen Saturation is 98% on room air, normal by my interpretation.    COORDINATION OF CARE: 5:40 PM-Discussed treatment plan which includes arthritis medication with pt at bedside and pt agreed to plan.   Labs Review Labs Reviewed - No data to display  Imaging Review No results found.   EKG Interpretation None      MDM   Final diagnoses:  Arthritis  Seasonal allergies    7:56 PM Patient will have mobic for arthritis and claritin for allergies. Patient will be contacted for appointment for community health and wellness.   I personally performed the services described in this documentation, which was scribed in my presence. The recorded information has been reviewed and is accurate.    Emilia Beck, PA-C 04/06/15 1957  Richardean Canal, MD 04/06/15 [phone]

## 2015-05-13 ENCOUNTER — Encounter (HOSPITAL_COMMUNITY): Payer: Self-pay

## 2015-05-13 ENCOUNTER — Emergency Department (HOSPITAL_COMMUNITY)
Admission: EM | Admit: 2015-05-13 | Discharge: 2015-05-13 | Disposition: A | Discharge status: Home / Self Care | Payer: Medicare Other | Attending: Emergency Medicine | Admitting: Emergency Medicine

## 2015-05-13 DIAGNOSIS — R52 Pain, unspecified: Secondary | ICD-10-CM | POA: Insufficient documentation

## 2015-05-13 DIAGNOSIS — Z8781 Personal history of (healed) traumatic fracture: Secondary | ICD-10-CM | POA: Insufficient documentation

## 2015-05-13 DIAGNOSIS — R5383 Other fatigue: Secondary | ICD-10-CM | POA: Insufficient documentation

## 2015-05-13 DIAGNOSIS — M1990 Unspecified osteoarthritis, unspecified site: Secondary | ICD-10-CM

## 2015-05-13 DIAGNOSIS — R0981 Nasal congestion: Secondary | ICD-10-CM | POA: Insufficient documentation

## 2015-05-13 DIAGNOSIS — Z720 Tobacco use: Secondary | ICD-10-CM | POA: Insufficient documentation

## 2015-05-13 DIAGNOSIS — Z79899 Other long term (current) drug therapy: Secondary | ICD-10-CM | POA: Diagnosis not present

## 2015-05-13 DIAGNOSIS — Z8719 Personal history of other diseases of the digestive system: Secondary | ICD-10-CM | POA: Insufficient documentation

## 2015-05-13 DIAGNOSIS — J45909 Unspecified asthma, uncomplicated: Secondary | ICD-10-CM | POA: Diagnosis not present

## 2015-05-13 MED ORDER — IBUPROFEN 400 MG PO TABS: 400.0000 mg | ORAL_TABLET | Freq: Four times a day (QID) | ORAL | Status: DC | PRN

## 2015-05-13 MED ORDER — IBUPROFEN 800 MG PO TABS
800.0000 mg | ORAL_TABLET | Freq: Once | ORAL | Status: AC
Start: 2015-05-13 — End: 2015-05-13
  Administered 2015-05-13: 800 mg via ORAL
  Filled 2015-05-13: qty 1

## 2015-05-13 MED ORDER — LORATADINE-PSEUDOEPHEDRINE ER 5-120 MG PO TB12: 1.0000 | ORAL_TABLET | Freq: Two times a day (BID) | ORAL | Status: DC

## 2015-05-13 MED ORDER — FLUTICASONE PROPIONATE 50 MCG/ACT NA SUSP: 2.0000 | Freq: Every day | NASAL | Status: DC

## 2015-05-13 NOTE — ED Provider Notes (Signed)
CSN: [id number]     Arrival date & time 05/13/15  1202 History   First MD Initiated Contact with Patient 05/13/15 1505     Chief Complaint  Patient presents with  . Generalized Body Aches  . Nasal Congestion     (Consider location/radiation/quality/duration/timing/severity/associated sxs/prior Treatment) Patient is a 54 y.o. male presenting with general illness. The history is provided by the patient.  Illness Location:  Nose, back, left shoulder Quality:  Drainage, aching Severity:  Moderate Onset quality:  Gradual Duration: "my whole life" Timing:  Constant Progression:  Unchanged Chronicity:  New Context:  Warm weather Relieved by:  Cold air Worsened by:  Laying down Associated symptoms: congestion (nasal), cough (chronic, smokes 1/2 ppd) and fatigue   Associated symptoms: no abdominal pain, no fever, no loss of consciousness, no rash, no shortness of breath and no wheezing     Past Medical History  Diagnosis Date  . Arthritis   . Asthma   . EtOH dependence     drinks 2-3 40s a day  . Fibula fracture     Dr Dion Saucier 02-2011  . Hernia     surgery 01-17-13   Past Surgical History  Procedure Laterality Date  . Ankle surgery    . Inguinal hernia repair Right 01/19/2013    Procedure: HERNIA REPAIR INGUINAL ADULT;  Surgeon: [name], MD,FACS;  Location: [facility] OR;  Service: General;  Laterality: Right;  . Tibia im nail insertion Left 01/22/2013    Procedure: INTRAMEDULLARY (IM) NAIL TIBIAL;  Surgeon: [name], MD;  Location: [facility] OR;  Service: Orthopedics;  Laterality: Left;   Family History  Problem Relation Age of Onset  . Diabetes Mother   . Diabetes Father   . Hypertension Father    History  Substance Use Topics  . Smoking status: Current Every Day Smoker -- 1.00 packs/day    Types: Cigarettes  . Smokeless tobacco: Not on file     Comment: 1 ppd  . Alcohol Use: 8.4 oz/week    14 Cans of beer per week     Comment: occasionally; generally drinks (2) 40s/day     Review of Systems  Constitutional: Positive for fatigue. Negative for fever.  HENT: Positive for congestion (nasal).   Respiratory: Positive for cough (chronic, smokes 1/2 ppd). Negative for shortness of breath and wheezing.   Gastrointestinal: Negative for abdominal pain.  Skin: Negative for rash.  Neurological: Negative for loss of consciousness.  All other systems reviewed and are negative.     Allergies  Review of patient's allergies indicates no known allergies.  Home Medications   Prior to Admission medications   Medication Sig Start Date End Date Taking? Authorizing Provider  cyclobenzaprine (FLEXERIL) 10 MG tablet Take 1 tablet (10 mg total) by mouth 2 (two) times daily as needed for muscle spasms. 03/21/14  Yes Kaitlyn Szekalski, PA-C  VITAMIN E PO Take 1 capsule by mouth 2 (two) times daily.   Yes Historical Provider, MD  fluticasone (FLONASE) 50 MCG/ACT nasal spray Place 2 sprays into both nostrils daily. 05/13/15   Lyndal Pulley, MD  ibuprofen (ADVIL,MOTRIN) 400 MG tablet Take 1 tablet (400 mg total) by mouth every 6 (six) hours as needed (pain). 05/13/15   Lyndal Pulley, MD  levofloxacin (LEVAQUIN) 750 MG tablet Take 1 tablet (750 mg total) by mouth daily. 03/02/15   Glynn Octave, MD  loratadine-pseudoephedrine (CLARITIN-D 12 HOUR) 5-120 MG per tablet Take 1 tablet by mouth 2 (two) times daily. 05/13/15   Reuel Boom  Clydene PughKnott, MD  meloxicam (MOBIC) 15 MG tablet Take 1 tablet (15 mg total) by mouth daily. 04/06/15   Emilia BeckKaitlyn Szekalski, PA-C  oxyCODONE-acetaminophen (PERCOCET/ROXICET) 5-325 MG per tablet Take 1-2 tablets by mouth every 4 (four) hours as needed for severe pain. Patient not taking: Reported on 03/02/2015 03/10/14   Elpidio AnisShari Upstill, PA-C   BP 154/90 mmHg  Pulse 89  Temp(Src) 98.2 F (36.8 C) (Oral)  Resp 20  Ht 6\' 1"  (1.854 m)  Wt 165 lb (74.844 kg)  BMI 21.77 kg/m2  SpO2 95% Physical Exam  Constitutional: He is oriented to person, place, and time. He appears  well-developed and well-nourished. No distress.  HENT:  Head: Normocephalic and atraumatic.  Nose: Mucosal edema (with congestion) present. No nasal deformity or septal deviation. No epistaxis. Right sinus exhibits no maxillary sinus tenderness and no frontal sinus tenderness. Left sinus exhibits no maxillary sinus tenderness and no frontal sinus tenderness.  Eyes: Conjunctivae are normal.  Neck: Neck supple. No tracheal deviation present.  Cardiovascular: Normal rate, regular rhythm, S1 normal and S2 normal.   Pulmonary/Chest: Effort normal and breath sounds normal. No respiratory distress.  Abdominal: Soft. He exhibits no distension.  Neurological: He is alert and oriented to person, place, and time.  Skin: Skin is warm and dry.  Psychiatric: He has a normal mood and affect.    ED Course  Procedures (including critical care time) Labs Review Labs Reviewed - No data to display  Imaging Review No results found.   EKG Interpretation None      MDM   Final diagnoses:  Nasal congestion  Chronic arthritis    54 year old male presents with multiple years of ongoing nasal congestion and diffuse musculoskeletal body aches. He states that the only thing that works for him is oxycodone. I explained that we will not prescribe ongoing narcotic medications from the emergency department. I recommended NSAIDs, early mobility, topical decongestants for his nose since he has not had much relief from antihistamine therapy and above all smoking cessation. [facility] for establishing a primary care physician.     Lyndal Pulleyaniel Surina Storts, MD 05/13/15 1556  Arby BarretteMarcy Pfeiffer, MD 05/15/15 [phone]

## 2015-05-13 NOTE — ED Notes (Signed)
Pt states he has sinus issues and body aches all the time but they got worse last night to make him want to be seen today.

## 2015-05-13 NOTE — Discharge Instructions (Signed)
Arthritis, Nonspecific °Arthritis is inflammation of a joint. This usually means pain, redness, warmth or swelling are present. One or more joints may be involved. There are a number of types of arthritis. Your caregiver may not be able to tell what type of arthritis you have right away. °CAUSES  °The most common cause of arthritis is the wear and tear on the joint (osteoarthritis). This causes damage to the cartilage, which can break down over time. The knees, hips, back and neck are most often affected by this type of arthritis. °Other types of arthritis and common causes of joint pain include: °· Sprains and other injuries near the joint. Sometimes minor sprains and injuries cause pain and swelling that develop hours later. °· Rheumatoid arthritis. This affects hands, feet and knees. It usually affects both sides of your body at the same time. It is often associated with chronic ailments, fever, weight loss and general weakness. °· Crystal arthritis. Gout and pseudo gout can cause occasional acute severe pain, redness and swelling in the foot, ankle, or knee. °· Infectious arthritis. Bacteria can get into a joint through a break in overlying skin. This can cause infection of the joint. Bacteria and viruses can also spread through the blood and affect your joints. °· Drug, infectious and allergy reactions. Sometimes joints can become mildly painful and slightly swollen with these types of illnesses. °SYMPTOMS  °· Pain is the main symptom. °· Your joint or joints can also be red, swollen and warm or hot to the touch. °· You may have a fever with certain types of arthritis, or even feel overall ill. °· The joint with arthritis will hurt with movement. Stiffness is present with some types of arthritis. °DIAGNOSIS  °Your caregiver will suspect arthritis based on your description of your symptoms and on your exam. Testing may be needed to find the type of arthritis: °· Blood and sometimes urine tests. °· X-ray tests  and sometimes CT or MRI scans. °· Removal of fluid from the joint (arthrocentesis) is done to check for bacteria, crystals or other causes. Your caregiver (or a specialist) will numb the area over the joint with a local anesthetic, and use a needle to remove joint fluid for examination. This procedure is only minimally uncomfortable. °· Even with these tests, your caregiver may not be able to tell what kind of arthritis you have. Consultation with a specialist (rheumatologist) may be helpful. °TREATMENT  °Your caregiver will discuss with you treatment specific to your type of arthritis. If the specific type cannot be determined, then the following general recommendations may apply. °Treatment of severe joint pain includes: °· Rest. °· Elevation. °· Anti-inflammatory medication (for example, ibuprofen) may be prescribed. Avoiding activities that cause increased pain. °· Only take over-the-counter or prescription medicines for pain and discomfort as recommended by your caregiver. °· Cold packs over an inflamed joint may be used for 10 to 15 minutes every hour. Hot packs sometimes feel better, but do not use overnight. Do not use hot packs if you are diabetic without your caregiver's permission. °· A cortisone shot into arthritic joints may help reduce pain and swelling. °· Any acute arthritis that gets worse over the next 1 to 2 days needs to be looked at to be sure there is no joint infection. °Long-term arthritis treatment involves modifying activities and lifestyle to reduce joint stress jarring. This can include weight loss. Also, exercise is needed to nourish the joint cartilage and remove waste. This helps keep the muscles   around the joint strong. HOME CARE INSTRUCTIONS   Do not take aspirin to relieve pain if gout is suspected. This elevates uric acid levels.  Only take over-the-counter or prescription medicines for pain, discomfort or fever as directed by your caregiver.  Rest the joint as much as  possible.  If your joint is swollen, keep it elevated.  Use crutches if the painful joint is in your leg.  Drinking plenty of fluids may help for certain types of arthritis.  Follow your caregiver's dietary instructions.  Try low-impact exercise such as:  Swimming.  Water aerobics.  Biking.  Walking.  Morning stiffness is often relieved by a warm shower.  Put your joints through regular range-of-motion. SEEK MEDICAL CARE IF:   You do not feel better in 24 hours or are getting worse.  You have side effects to medications, or are not getting better with treatment. SEEK IMMEDIATE MEDICAL CARE IF:   You have a fever.  You develop severe joint pain, swelling or redness.  Many joints are involved and become painful and swollen.  There is severe back pain and/or leg weakness.  You have loss of bowel or bladder control. Document Released: 12/30/2004 Document Revised: 02/14/2012 Document Reviewed: 01/15/2009 [facility] Patient Information 2015 [place], [place]. This information is not intended to replace advice given to you by your health care provider. Make sure you discuss any questions you have with your health care provider. Allergic Rhinitis Allergic rhinitis is when the mucous membranes in the nose respond to allergens. Allergens are particles in the air that cause your body to have an allergic reaction. This causes you to release allergic antibodies. Through a chain of events, these eventually cause you to release histamine into the blood stream. Although meant to protect the body, it is this release of histamine that causes your discomfort, such as frequent sneezing, congestion, and an itchy, runny nose.  CAUSES  Seasonal allergic rhinitis (hay fever) is caused by pollen allergens that may come from grasses, trees, and weeds. Year-round allergic rhinitis (perennial allergic rhinitis) is caused by allergens such as house dust mites, pet dander, and mold spores.  SYMPTOMS    Nasal stuffiness (congestion).  Itchy, runny nose with sneezing and tearing of the eyes. DIAGNOSIS  Your health care provider can help you determine the allergen or allergens that trigger your symptoms. If you and your health care provider are unable to determine the allergen, skin or blood testing may be used. TREATMENT  Allergic rhinitis does not have a cure, but it can be controlled by:  Medicines and allergy shots (immunotherapy).  Avoiding the allergen. Hay fever may often be treated with antihistamines in pill or nasal spray forms. Antihistamines block the effects of histamine. There are over-the-counter medicines that may help with nasal congestion and swelling around the eyes. Check with your health care provider before taking or giving this medicine.  If avoiding the allergen or the medicine prescribed do not work, there are many new medicines your health care provider can prescribe. Stronger medicine may be used if initial measures are ineffective. Desensitizing injections can be used if medicine and avoidance does not work. Desensitization is when a patient is given ongoing shots until the body becomes less sensitive to the allergen. Make sure you follow up with your health care provider if problems continue. HOME CARE INSTRUCTIONS It is not possible to completely avoid allergens, but you can reduce your symptoms by taking steps to limit your exposure to them. It helps to know  exactly what you are allergic to so that you can avoid your specific triggers. SEEK MEDICAL CARE IF:   You have a fever.  You develop a cough that does not stop easily (persistent).  You have shortness of breath.  You start wheezing.  Symptoms interfere with normal daily activities. Document Released: 08/17/2001 Document Revised: 11/27/2013 Document Reviewed: 07/30/2013 [facility] Patient Information 2015 [place]. This information is not intended to replace advice given to you by your health care  provider. Make sure you discuss any questions you have with your health care provider.

## 2015-07-28 ENCOUNTER — Emergency Department (HOSPITAL_COMMUNITY)
Admission: EM | Admit: 2015-07-28 | Discharge: 2015-07-28 | Disposition: A | Discharge status: Home / Self Care | Payer: Medicare Other | Attending: Emergency Medicine | Admitting: Emergency Medicine

## 2015-07-28 ENCOUNTER — Encounter (HOSPITAL_COMMUNITY): Payer: Self-pay

## 2015-07-28 DIAGNOSIS — Z79899 Other long term (current) drug therapy: Secondary | ICD-10-CM | POA: Insufficient documentation

## 2015-07-28 DIAGNOSIS — J45909 Unspecified asthma, uncomplicated: Secondary | ICD-10-CM | POA: Diagnosis not present

## 2015-07-28 DIAGNOSIS — Z720 Tobacco use: Secondary | ICD-10-CM | POA: Insufficient documentation

## 2015-07-28 DIAGNOSIS — M25511 Pain in right shoulder: Secondary | ICD-10-CM | POA: Diagnosis not present

## 2015-07-28 DIAGNOSIS — Z8781 Personal history of (healed) traumatic fracture: Secondary | ICD-10-CM | POA: Insufficient documentation

## 2015-07-28 DIAGNOSIS — J309 Allergic rhinitis, unspecified: Secondary | ICD-10-CM

## 2015-07-28 DIAGNOSIS — M1990 Unspecified osteoarthritis, unspecified site: Secondary | ICD-10-CM | POA: Diagnosis not present

## 2015-07-28 DIAGNOSIS — R0981 Nasal congestion: Secondary | ICD-10-CM | POA: Diagnosis present

## 2015-07-28 MED ORDER — FLUTICASONE PROPIONATE 50 MCG/ACT NA SUSP: 1.0000 | Freq: Every day | NASAL | Status: DC

## 2015-07-28 MED ORDER — IBUPROFEN 400 MG PO TABS
600.0000 mg | ORAL_TABLET | Freq: Once | ORAL | Status: AC
  Administered 2015-07-28: 600 mg via ORAL
  Filled 2015-07-28: qty 2

## 2015-07-28 NOTE — ED Notes (Signed)
Pt stable, ambulatory, states understanding of discharge instructions 

## 2015-07-28 NOTE — Discharge Instructions (Signed)
Allergies °Allergies may happen from anything your body is sensitive to. This may be food, medicines, pollens, chemicals, and nearly anything around you in everyday life that produces allergens. An allergen is anything that causes an allergy producing substance. Heredity is often a factor in causing these problems. This means you may have some of the same allergies as your parents. °Food allergies happen in all age groups. Food allergies are some of the most severe and life threatening. Some common food allergies are cow's milk, seafood, eggs, nuts, wheat, and soybeans. °SYMPTOMS  °· Swelling around the mouth. °· An itchy red rash or hives. °· Vomiting or diarrhea. °· Difficulty breathing. °SEVERE ALLERGIC REACTIONS ARE LIFE-THREATENING. °This reaction is called anaphylaxis. It can cause the mouth and throat to swell and cause difficulty with breathing and swallowing. In severe reactions only a trace amount of food (for example, peanut oil in a salad) may cause death within seconds. °Seasonal allergies occur in all age groups. These are seasonal because they usually occur during the same season every year. They may be a reaction to molds, grass pollens, or tree pollens. Other causes of problems are house dust mite allergens, pet dander, and mold spores. The symptoms often consist of nasal congestion, a runny itchy nose associated with sneezing, and tearing itchy eyes. There is often an associated itching of the mouth and ears. The problems happen when you come in contact with pollens and other allergens. Allergens are the particles in the air that the body reacts to with an allergic reaction. This causes you to release allergic antibodies. Through a chain of events, these eventually cause you to release histamine into the blood stream. Although it is meant to be protective to the body, it is this release that causes your discomfort. This is why you were given anti-histamines to feel better.  If you are unable to  pinpoint the offending allergen, it may be determined by skin or blood testing. Allergies cannot be cured but can be controlled with medicine. °Hay fever is a collection of all or some of the seasonal allergy problems. It may often be treated with simple over-the-counter medicine such as diphenhydramine. Take medicine as directed. Do not drink alcohol or drive while taking this medicine. Check with your caregiver or package insert for child dosages. °If these medicines are not effective, there are many new medicines your caregiver can prescribe. Stronger medicine such as nasal spray, eye drops, and corticosteroids may be used if the first things you try do not work well. Other treatments such as immunotherapy or desensitizing injections can be used if all else fails. Follow up with your caregiver if problems continue. These seasonal allergies are usually not life threatening. They are generally more of a nuisance that can often be handled using medicine. °HOME CARE INSTRUCTIONS  °· If unsure what causes a reaction, keep a diary of foods eaten and symptoms that follow. Avoid foods that cause reactions. °· If hives or rash are present: °· Take medicine as directed. °· You may use an over-the-counter antihistamine (diphenhydramine) for hives and itching as needed. °· Apply cold compresses (cloths) to the skin or take baths in cool water. Avoid hot baths or showers. Heat will make a rash and itching worse. °· If you are severely allergic: °· Following a treatment for a severe reaction, hospitalization is often required for closer follow-up. °· Wear a medic-alert bracelet or necklace stating the allergy. °· You and your family must learn how to give adrenaline or use   an anaphylaxis kit.  If you have had a severe reaction, always carry your anaphylaxis kit or EpiPen with you. Use this medicine as directed by your caregiver if a severe reaction is occurring. Failure to do so could have a fatal outcome. SEEK MEDICAL  CARE IF:  You suspect a food allergy. Symptoms generally happen within 30 minutes of eating a food.  Your symptoms have not gone away within 2 days or are getting worse.  You develop new symptoms.  You want to retest yourself or your child with a food or drink you think causes an allergic reaction. Never do this if an anaphylactic reaction to that food or drink has happened before. Only do this under the care of a caregiver. SEEK IMMEDIATE MEDICAL CARE IF:   You have difficulty breathing, are wheezing, or have a tight feeling in your chest or throat.  You have a swollen mouth, or you have hives, swelling, or itching all over your body.  You have had a severe reaction that has responded to your anaphylaxis kit or an EpiPen. These reactions may return when the medicine has worn off. These reactions should be considered life threatening. MAKE SURE YOU:   Understand these instructions.  Will watch your condition.  Will get help right away if you are not doing well or get worse. Document Released: 02/15/2003 Document Revised: 03/19/2013 Document Reviewed: 07/22/2008 [facility] Patient Information 2015 [place], [place]. This information is not intended to replace advice given to you by your health care provider. Make sure you discuss any questions you have with your health care provider.  Acromioclavicular Injuries The acromioclavicular [facility]) joint is the joint in the shoulder. There are many bands of tissue (ligaments) that surround the [facility] bones and joints. These bands of tissue can tear, which can lead to sprains and separations. The bones of the [facility] joint can also break (fracture).  HOME CARE   Put ice on the injured area.  Put ice in a plastic bag.  Place a towel between your skin and the bag.  Leave the ice on for 15-20 minutes, 03-04 times a day.  Wear your sling as told by your doctor. Remove the sling before showering and bathing. Keep the shoulder in the same place as when the  sling is on. Do not lift the arm.  Gently tighten your figure-eight splint (if applied) every day. Tighten it enough to keep the shoulders held back. There should be room to place your finger between your body and the strap. Loosen the splint right away if you lose feeling (numbness) or have tingling in your hands.  Only take medicine as told by your doctor.  Keep all follow-up visits with your doctor. GET HELP RIGHT AWAY IF:   Your medicine does not help your pain.  You have more puffiness (swelling) or your bruising gets worse rather than better.  You were unable to follow up as told by your doctor.  You have tingling or lose even more feeling in your arm, forearm, or hand.  Your arm is cold or pale.  You have more pain in the hand, forearm, or fingers. MAKE SURE YOU:   Understand these instructions.  Will watch your condition.  Will get help right away if you are not doing well or get worse. Document Released: 05/12/2010 Document Revised: 02/14/2012 Document Reviewed: 05/12/2010 [facility] Patient Information 2015 [place], [place]. This information is not intended to replace advice given to you by your health care provider. Make sure you discuss  any questions you have with your health care provider.  Please follow-up with [facility], or your primary care provider for further evaluation and management of chronic conditions. Please use ibuprofen or Tylenol as needed for pain.

## 2015-07-28 NOTE — ED Notes (Signed)
Pt has been seen here for fractured right shoulder and states it is still bothering him. Fractured a while back. Hasn't been able to follow up with a new PCP. Also has been having nasal congestion and nasal drainage.

## 2015-07-28 NOTE — ED Provider Notes (Signed)
CSN: [id number]     Arrival date & time 07/28/15  2049 History  This chart was scribed for Eyvonne Mechanic, PA-C, working with Vanetta Mulders, MD by Elon Spanner, ED Scribe. This patient was seen in room TR02C/TR02C and the patient's care was started at 10:30 PM.   Chief Complaint  Patient presents with  . Shoulder Pain  . Nasal Congestion   The history is provided by the patient. No language interpreter was used.   HPI Comments: Frederick [name] is a 54 y.o. male who presents to the Emergency Department complaining of intermittent, moderate right shoulder pain, worse with abduction onset 2013 after fx of the right clavicle.  The patient was seen after the initial injury in the ED where he was prescribed percocet with pain relief.  The patient has been unable to successfully establish primary care since and has tried no other medications or treatments.      The patient also complains of chronic nasal congestion, cough, and post-nasal drip that worsened two weeks ago.  He has used an OTC nasal spray and Mucinex without relief.  He denies trouble swallowing, voice changes, and fever.     Past Medical History  Diagnosis Date  . Arthritis   . Asthma   . EtOH dependence     drinks 2-3 40s a day  . Fibula fracture     Dr Dion Saucier 02-2011  . Hernia     surgery 01-17-13   Past Surgical History  Procedure Laterality Date  . Ankle surgery    . Inguinal hernia repair Right 01/19/2013    Procedure: HERNIA REPAIR INGUINAL ADULT;  Surgeon: [name], MD,FACS;  Location: [facility] OR;  Service: General;  Laterality: Right;  . Tibia im nail insertion Left 01/22/2013    Procedure: INTRAMEDULLARY (IM) NAIL TIBIAL;  Surgeon: [name], MD;  Location: [facility] OR;  Service: Orthopedics;  Laterality: Left;   Family History  Problem Relation Age of Onset  . Diabetes Mother   . Diabetes Father   . Hypertension Father    Social History  Substance Use Topics  . Smoking status: Current Every Day Smoker -- 1.00  packs/day    Types: Cigarettes  . Smokeless tobacco: None     Comment: 1 ppd  . Alcohol Use: 8.4 oz/week    14 Cans of beer per week     Comment: occasionally; generally drinks (2) 40s/day    Review of Systems  All other systems reviewed and are negative.   Allergies  Review of patient's allergies indicates no known allergies.  Home Medications   Prior to Admission medications   Medication Sig Start Date End Date Taking? Authorizing Provider  cyclobenzaprine (FLEXERIL) 10 MG tablet Take 1 tablet (10 mg total) by mouth 2 (two) times daily as needed for muscle spasms. 03/21/14   Kaitlyn Szekalski, PA-C  fluticasone (FLONASE) 50 MCG/ACT nasal spray Place 1 spray into both nostrils daily. 07/28/15   Eyvonne Mechanic, PA-C  ibuprofen (ADVIL,MOTRIN) 400 MG tablet Take 1 tablet (400 mg total) by mouth every 6 (six) hours as needed (pain). 05/13/15   Lyndal Pulley, MD  levofloxacin (LEVAQUIN) 750 MG tablet Take 1 tablet (750 mg total) by mouth daily. 03/02/15   Glynn Octave, MD  loratadine-pseudoephedrine (CLARITIN-D 12 HOUR) 5-120 MG per tablet Take 1 tablet by mouth 2 (two) times daily. 05/13/15   Lyndal Pulley, MD  meloxicam (MOBIC) 15 MG tablet Take 1 tablet (15 mg total) by mouth daily. 04/06/15   Yvonna Alanis  Szekalski, PA-C  oxyCODONE-acetaminophen (PERCOCET/ROXICET) 5-325 MG per tablet Take 1-2 tablets by mouth every 4 (four) hours as needed for severe pain. Patient not taking: Reported on 03/02/2015 03/10/14   [name], PA-C  VITAMIN E PO Take 1 capsule by mouth 2 (two) times daily.    Historical Provider, MD   BP 131/88 mmHg  Pulse 103  Temp(Src) 99.4 F (37.4 C) (Oral)  Resp 24  SpO2 98%   Physical Exam  Constitutional: He is oriented to person, place, and time. He appears well-developed and well-nourished. No distress.  HENT:  Head: Normocephalic and atraumatic.  Right Ear: Hearing, tympanic membrane, external ear and ear canal normal.  Left Ear: Hearing, tympanic membrane, external  ear and ear canal normal.  Nose: Nose normal. No mucosal edema or rhinorrhea.  Mouth/Throat: Uvula is midline, oropharynx is clear and moist and mucous membranes are normal. No oropharyngeal exudate, posterior oropharyngeal edema, posterior oropharyngeal erythema or tonsillar abscesses.  Eyes: Conjunctivae and EOM are normal.  Neck: Neck supple. No tracheal deviation present.  Cardiovascular: Normal rate.   Pulmonary/Chest: Effort normal. No respiratory distress.  Musculoskeletal: Normal range of motion.  Neurological: He is alert and oriented to person, place, and time.  Skin: Skin is warm and dry.  Psychiatric: He has a normal mood and affect. His behavior is normal.  Nursing note and vitals reviewed.   ED Course  Procedures (including critical care time)  DIAGNOSTIC STUDIES: Oxygen Saturation is 99% on RA, normal by my interpretation.    COORDINATION OF CARE:  10:35 PM Discussed treatment plan with patient at bedside.  Patient acknowledges and agrees with plan.    Labs Review Labs Reviewed - No data to display  Imaging Review No results found. I have personally reviewed and evaluated these images and lab results as part of my medical decision-making.   EKG Interpretation None      MDM   Final diagnoses:  Right shoulder pain  Allergic rhinitis, unspecified allergic rhinitis type   Labs:  Imaging:  Therapeutics: Ibuprofen  Assessment/Plan: Pt presents with chronic shoulder pain and allergic rhinitis. He has been seen for this recently and has not followed care instructions. Will reinforce medication compliance for these chronic issues. Pt reports that he did not follow-up with primary care because he doesn't know where to go. He was given direction and address. Follow-up with PCP, strict return precautions.   Discharge Meds: flonase, tylenol    I personally performed the services described in this documentation, which was scribed in my presence. The recorded  information has been reviewed and is accurate.   Eyvonne Mechanic, PA-C 07/30/15 1411  Vanetta Mulders, MD 08/01/15 [phone]

## 2015-07-31 ENCOUNTER — Encounter (HOSPITAL_COMMUNITY): Payer: Self-pay | Admitting: Emergency Medicine

## 2015-07-31 ENCOUNTER — Emergency Department (INDEPENDENT_AMBULATORY_CARE_PROVIDER_SITE_OTHER)
Admission: EM | Admit: 2015-07-31 | Discharge: 2015-07-31 | Disposition: A | Discharge status: Home / Self Care | Payer: Medicare Other | Source: Home / Self Care | Attending: Family Medicine | Admitting: Family Medicine

## 2015-07-31 ENCOUNTER — Emergency Department (INDEPENDENT_AMBULATORY_CARE_PROVIDER_SITE_OTHER): Payer: Medicare Other

## 2015-07-31 DIAGNOSIS — S01511A Laceration without foreign body of lip, initial encounter: Secondary | ICD-10-CM

## 2015-07-31 DIAGNOSIS — S42351A Displaced comminuted fracture of shaft of humerus, right arm, initial encounter for closed fracture: Secondary | ICD-10-CM | POA: Diagnosis not present

## 2015-07-31 MED ORDER — KETOROLAC TROMETHAMINE 60 MG/2ML IM SOLN
60.0000 mg | Freq: Once | INTRAMUSCULAR | Status: AC
  Administered 2015-07-31: 60 mg via INTRAMUSCULAR

## 2015-07-31 MED ORDER — KETOROLAC TROMETHAMINE 60 MG/2ML IM SOLN
INTRAMUSCULAR | Status: AC
  Filled 2015-07-31: qty 2

## 2015-07-31 MED ORDER — OXYCODONE-ACETAMINOPHEN 5-325 MG PO TABS: 1.0000 | ORAL_TABLET | ORAL | Status: DC | PRN

## 2015-07-31 NOTE — ED Notes (Signed)
C/o right shoulder pain States right shoulder pain radiating down to elbow States he missed a step last night and fall

## 2015-07-31 NOTE — Discharge Instructions (Signed)
You have broken your humerus. Please wear the sling as instructed. Please follow-up with Dr. Eulah Pont tomorrow morning at 8:30 AM.

## 2015-07-31 NOTE — ED Provider Notes (Signed)
CSN: [id number]     Arrival date & time 07/31/15  1756 History   First MD Initiated Contact with Patient 07/31/15 1813     Chief Complaint  Patient presents with  . Fall   (Consider location/radiation/quality/duration/timing/severity/associated sxs/prior Treatment) HPI  Fall. Patient states that he felt up concrete stairs last night. After significant questioning patient admitted to being completely drunk at the time of the incident. Patient remembers very little of the incident but he does remember falling and hitting his right arm on the stairs. Patient also struck his lip causing it to bleed. Patient states that the arm was medially painful but went upstairs and went to bed for the night. He has not taken anything for the pain. Wrist sensation and movement are intact. Patient states that he has not had anything to eat since last night and that his last drink of water was a few minutes prior to presentation here in the urgent care.    Past Medical History  Diagnosis Date  . Arthritis   . Asthma   . EtOH dependence     drinks 2-3 40s a day  . Fibula fracture     Dr Dion Saucier 02-2011  . Hernia     surgery 01-17-13   Past Surgical History  Procedure Laterality Date  . Ankle surgery    . Inguinal hernia repair Right 01/19/2013    Procedure: HERNIA REPAIR INGUINAL ADULT;  Surgeon: [name], MD,FACS;  Location: [facility] OR;  Service: General;  Laterality: Right;  . Tibia im nail insertion Left 01/22/2013    Procedure: INTRAMEDULLARY (IM) NAIL TIBIAL;  Surgeon: [name], MD;  Location: [facility] OR;  Service: Orthopedics;  Laterality: Left;   Family History  Problem Relation Age of Onset  . Diabetes Mother   . Diabetes Father   . Hypertension Father    Social History  Substance Use Topics  . Smoking status: Current Every Day Smoker -- 1.00 packs/day    Types: Cigarettes  . Smokeless tobacco: None     Comment: 1 ppd  . Alcohol Use: 8.4 oz/week    14 Cans of beer per week     Comment:  occasionally; generally drinks (2) 40s/day    Review of Systems Per HPI with all other pertinent systems negative.   Allergies  Review of patient's allergies indicates no known allergies.  Home Medications   Prior to Admission medications   Medication Sig Start Date End Date Taking? Authorizing Provider  cyclobenzaprine (FLEXERIL) 10 MG tablet Take 1 tablet (10 mg total) by mouth 2 (two) times daily as needed for muscle spasms. 03/21/14   Kaitlyn Szekalski, PA-C  fluticasone (FLONASE) 50 MCG/ACT nasal spray Place 1 spray into both nostrils daily. 07/28/15   Eyvonne Mechanic, PA-C  ibuprofen (ADVIL,MOTRIN) 400 MG tablet Take 1 tablet (400 mg total) by mouth every 6 (six) hours as needed (pain). 05/13/15   Lyndal Pulley, MD  levofloxacin (LEVAQUIN) 750 MG tablet Take 1 tablet (750 mg total) by mouth daily. 03/02/15   Glynn Octave, MD  loratadine-pseudoephedrine (CLARITIN-D 12 HOUR) 5-120 MG per tablet Take 1 tablet by mouth 2 (two) times daily. 05/13/15   Lyndal Pulley, MD  meloxicam (MOBIC) 15 MG tablet Take 1 tablet (15 mg total) by mouth daily. 04/06/15   Emilia Beck, PA-C  oxyCODONE-acetaminophen (PERCOCET/ROXICET) 5-325 MG per tablet Take 1-2 tablets by mouth every 4 (four) hours as needed for severe pain. 07/31/15   Ozella Rocks, MD  VITAMIN E PO Take  1 capsule by mouth 2 (two) times daily.    Historical Provider, MD   Meds Ordered and Administered this Visit   Medications  ketorolac (TORADOL) injection 60 mg (not administered)    BP 127/91 mmHg  Pulse 124  Temp(Src) 99 F (37.2 C) (Oral)  Resp 20  SpO2 100% No data found.   Physical Exam Physical Exam  Constitutional: oriented to person, place, and time. appears well-developed and well-nourished. No distress.  HENT:  Head: Normocephalic and atraumatic.  Eyes: EOMI. PERRL.  Neck: Normal range of motion.  Cardiovascular: RRR, no m/r/g, 2+ distal pulses,  Pulmonary/Chest: Effort normal and breath sounds normal. No  respiratory distress.  Abdominal: Soft. Bowel sounds are normal. NonTTP, no distension.  Musculoskeletal: Right arm swollen and tender to palpation throughout the humeral region, right radial pulses intact, right wrist with full range of motion and 5 out of 5 grip strength.  Neurological: alert and oriented to person, place, and time.  Skin: Skin is warm. No rash noted. non diaphoretic.  Psychiatric: normal mood and affect. behavior is normal. Judgment and thought content normal.   ED Course  Procedures (including critical care time)  Labs Review Labs Reviewed - No data to display  Imaging Review  Dg Humerus Right  07/31/2015   CLINICAL DATA:  Status post fall injury to knee right arm yesterday.  EXAM: RIGHT HUMERUS - 2+ VIEW  COMPARISON:  None.  FINDINGS: There is comminuted displaced fracture of the right humeral shaft. No other acute fracture dislocation is noted.  IMPRESSION: Fracture right humeral shaft.   Electronically Signed   By: Sherian Rein M.D.   On: 07/31/2015 18:37     Visual Acuity Review  Right Eye Distance:   Left Eye Distance:   Bilateral Distance:    Right Eye Near:   Left Eye Near:    Bilateral Near:         MDM   1. Comminuted fracture of humerus, right, closed, initial encounter   2. Lip laceration, initial encounter    Lip laceration artery well-healing. Nothing to do at this point time. Discussed comminuted humeral fracture with on-call orthopedic surgeon Dr. Margarita Rana who has graciously great agreed to see the patient in his clinic small morning at 8:30 AM. Will follow his recommendations and place patient in a sling at this point time and give him a limited amount of Percocet for his pain until that time. Patient neurovascularly intact. Patient to the ED if her some reason his symptoms worsen.    Ozella Rocks, MD 07/31/15 [phone]

## 2015-11-28 ENCOUNTER — Inpatient Hospital Stay (HOSPITAL_COMMUNITY)
Admission: EM | Admit: 2015-11-28 | Discharge: 2015-12-03 | DRG: 177 | Disposition: A | Discharge status: Home / Self Care | Payer: Medicare Other | Attending: Internal Medicine | Admitting: Internal Medicine

## 2015-11-28 ENCOUNTER — Encounter (HOSPITAL_COMMUNITY): Payer: Self-pay | Admitting: Emergency Medicine

## 2015-11-28 ENCOUNTER — Emergency Department (INDEPENDENT_AMBULATORY_CARE_PROVIDER_SITE_OTHER)
Admission: EM | Admit: 2015-11-28 | Discharge: 2015-11-28 | Disposition: A | Discharge status: Nursing Home (Includes ICF) | Payer: Medicare Other | Source: Home / Self Care | Attending: Emergency Medicine | Admitting: Emergency Medicine

## 2015-11-28 ENCOUNTER — Emergency Department (HOSPITAL_COMMUNITY): Payer: Medicare Other

## 2015-11-28 ENCOUNTER — Emergency Department (INDEPENDENT_AMBULATORY_CARE_PROVIDER_SITE_OTHER): Payer: Medicare Other

## 2015-11-28 DIAGNOSIS — F1029 Alcohol dependence with unspecified alcohol-induced disorder: Secondary | ICD-10-CM | POA: Diagnosis not present

## 2015-11-28 DIAGNOSIS — S2232XD Fracture of one rib, left side, subsequent encounter for fracture with routine healing: Secondary | ICD-10-CM | POA: Diagnosis not present

## 2015-11-28 DIAGNOSIS — F1020 Alcohol dependence, uncomplicated: Secondary | ICD-10-CM | POA: Diagnosis present

## 2015-11-28 DIAGNOSIS — J449 Chronic obstructive pulmonary disease, unspecified: Secondary | ICD-10-CM | POA: Diagnosis present

## 2015-11-28 DIAGNOSIS — Y92003 Bedroom of unspecified non-institutional (private) residence as the place of occurrence of the external cause: Secondary | ICD-10-CM | POA: Diagnosis not present

## 2015-11-28 DIAGNOSIS — R938 Abnormal findings on diagnostic imaging of other specified body structures: Secondary | ICD-10-CM | POA: Diagnosis not present

## 2015-11-28 DIAGNOSIS — W010XXA Fall on same level from slipping, tripping and stumbling without subsequent striking against object, initial encounter: Secondary | ICD-10-CM | POA: Diagnosis present

## 2015-11-28 DIAGNOSIS — Z7951 Long term (current) use of inhaled steroids: Secondary | ICD-10-CM | POA: Diagnosis not present

## 2015-11-28 DIAGNOSIS — R05 Cough: Secondary | ICD-10-CM | POA: Diagnosis not present

## 2015-11-28 DIAGNOSIS — Z720 Tobacco use: Secondary | ICD-10-CM | POA: Diagnosis not present

## 2015-11-28 DIAGNOSIS — S2232XA Fracture of one rib, left side, initial encounter for closed fracture: Secondary | ICD-10-CM | POA: Diagnosis present

## 2015-11-28 DIAGNOSIS — D72829 Elevated white blood cell count, unspecified: Secondary | ICD-10-CM | POA: Diagnosis present

## 2015-11-28 DIAGNOSIS — R9389 Abnormal findings on diagnostic imaging of other specified body structures: Secondary | ICD-10-CM

## 2015-11-28 DIAGNOSIS — F17210 Nicotine dependence, cigarettes, uncomplicated: Secondary | ICD-10-CM | POA: Diagnosis present

## 2015-11-28 DIAGNOSIS — R0781 Pleurodynia: Secondary | ICD-10-CM | POA: Diagnosis not present

## 2015-11-28 DIAGNOSIS — A150 Tuberculosis of lung: Secondary | ICD-10-CM | POA: Diagnosis present

## 2015-11-28 DIAGNOSIS — Z681 Body mass index (BMI) 19 or less, adult: Secondary | ICD-10-CM

## 2015-11-28 DIAGNOSIS — J984 Other disorders of lung: Secondary | ICD-10-CM

## 2015-11-28 DIAGNOSIS — N179 Acute kidney failure, unspecified: Secondary | ICD-10-CM | POA: Diagnosis present

## 2015-11-28 DIAGNOSIS — E43 Unspecified severe protein-calorie malnutrition: Secondary | ICD-10-CM | POA: Diagnosis present

## 2015-11-28 DIAGNOSIS — Z8611 Personal history of tuberculosis: Secondary | ICD-10-CM | POA: Diagnosis not present

## 2015-11-28 DIAGNOSIS — Z791 Long term (current) use of non-steroidal anti-inflammatories (NSAID): Secondary | ICD-10-CM | POA: Diagnosis not present

## 2015-11-28 DIAGNOSIS — E871 Hypo-osmolality and hyponatremia: Secondary | ICD-10-CM | POA: Diagnosis present

## 2015-11-28 DIAGNOSIS — D509 Iron deficiency anemia, unspecified: Secondary | ICD-10-CM | POA: Diagnosis present

## 2015-11-28 DIAGNOSIS — J45909 Unspecified asthma, uncomplicated: Secondary | ICD-10-CM | POA: Diagnosis present

## 2015-11-28 DIAGNOSIS — E440 Moderate protein-calorie malnutrition: Secondary | ICD-10-CM | POA: Diagnosis present

## 2015-11-28 DIAGNOSIS — R634 Abnormal weight loss: Secondary | ICD-10-CM

## 2015-11-28 HISTORY — DX: Respiratory tuberculosis unspecified: A15.9

## 2015-11-28 LAB — CBC WITH DIFFERENTIAL/PLATELET
BASOS PCT: 0 %
Basophils Absolute: 0 10*3/uL (ref 0.0–0.1)
EOS ABS: 0.3 10*3/uL (ref 0.0–0.7)
EOS PCT: 2 %
HCT: 25.1 % — ABNORMAL LOW (ref 39.0–52.0)
Hemoglobin: 7.6 g/dL — ABNORMAL LOW (ref 13.0–17.0)
LYMPHS ABS: 0.9 10*3/uL (ref 0.7–4.0)
Lymphocytes Relative: 6 %
MCH: 21.5 pg — ABNORMAL LOW (ref 26.0–34.0)
MCHC: 30.3 g/dL (ref 30.0–36.0)
MCV: 71.1 fL — AB (ref 78.0–100.0)
Monocytes Absolute: 1.5 10*3/uL — ABNORMAL HIGH (ref 0.1–1.0)
Monocytes Relative: 10 %
NEUTROS PCT: 82 %
Neutro Abs: 12.5 10*3/uL — ABNORMAL HIGH (ref 1.7–7.7)
Platelets: 604 10*3/uL — ABNORMAL HIGH (ref 150–400)
RBC: 3.53 MIL/uL — ABNORMAL LOW (ref 4.22–5.81)
RDW: 19.6 % — AB (ref 11.5–15.5)
WBC: 15.2 10*3/uL — ABNORMAL HIGH (ref 4.0–10.5)

## 2015-11-28 LAB — I-STAT CHEM 8, ED
BUN: 17 mg/dL (ref 6–20)
CALCIUM ION: 1.26 mmol/L — AB (ref 1.12–1.23)
CHLORIDE: 96 mmol/L — AB (ref 101–111)
Creatinine, Ser: 0.9 mg/dL (ref 0.61–1.24)
GLUCOSE: 109 mg/dL — AB (ref 65–99)
HCT: 28 % — ABNORMAL LOW (ref 39.0–52.0)
Hemoglobin: 9.5 g/dL — ABNORMAL LOW (ref 13.0–17.0)
Potassium: 4 mmol/L (ref 3.5–5.1)
SODIUM: 130 mmol/L — AB (ref 135–145)
TCO2: 26 mmol/L (ref 0–100)

## 2015-11-28 MED ORDER — ETHAMBUTOL HCL 400 MG PO TABS
1200.0000 mg | ORAL_TABLET | Freq: Every day | ORAL | Status: DC
  Administered 2015-11-29 (×2): 1200 mg via ORAL
  Filled 2015-11-28 (×2): qty 3

## 2015-11-28 MED ORDER — MORPHINE SULFATE (PF) 2 MG/ML IV SOLN: 1.0000 mg | INTRAVENOUS | Status: DC | PRN

## 2015-11-28 MED ORDER — VITAMIN B-6 100 MG PO TABS
100.0000 mg | ORAL_TABLET | Freq: Every day | ORAL | Status: DC
  Administered 2015-11-29 – 2015-12-03 (×6): 100 mg via ORAL
  Filled 2015-11-28 (×6): qty 1

## 2015-11-28 MED ORDER — IBUPROFEN 400 MG PO TABS
400.0000 mg | ORAL_TABLET | Freq: Four times a day (QID) | ORAL | Status: DC | PRN
  Administered 2015-11-29: 400 mg via ORAL
  Filled 2015-11-28: qty 1

## 2015-11-28 MED ORDER — IOHEXOL 300 MG/ML  SOLN
75.0000 mL | Freq: Once | INTRAMUSCULAR | Status: AC | PRN
  Administered 2015-11-28: 75 mL via INTRAVENOUS

## 2015-11-28 MED ORDER — FENTANYL CITRATE (PF) 100 MCG/2ML IJ SOLN
50.0000 ug | Freq: Once | INTRAMUSCULAR | Status: AC
Start: 2015-11-28 — End: 2015-11-28
  Administered 2015-11-28: 50 ug via INTRAVENOUS
  Filled 2015-11-28: qty 2

## 2015-11-28 MED ORDER — RIFAMPIN 300 MG PO CAPS
600.0000 mg | ORAL_CAPSULE | Freq: Every day | ORAL | Status: DC
  Administered 2015-11-29 (×2): 600 mg via ORAL
  Filled 2015-11-28 (×2): qty 2

## 2015-11-28 MED ORDER — ISONIAZID 300 MG PO TABS
300.0000 mg | ORAL_TABLET | Freq: Every day | ORAL | Status: DC
  Administered 2015-11-29 (×2): 300 mg via ORAL
  Filled 2015-11-28 (×2): qty 1

## 2015-11-28 MED ORDER — ONDANSETRON HCL 4 MG PO TABS: 4.0000 mg | ORAL_TABLET | Freq: Four times a day (QID) | ORAL | Status: DC | PRN

## 2015-11-28 MED ORDER — TIOTROPIUM BROMIDE MONOHYDRATE 18 MCG IN CAPS
18.0000 ug | ORAL_CAPSULE | Freq: Every day | RESPIRATORY_TRACT | Status: DC
  Administered 2015-11-29: 18 ug via RESPIRATORY_TRACT
  Filled 2015-11-28: qty 5

## 2015-11-28 MED ORDER — FLUTICASONE PROPIONATE 50 MCG/ACT NA SUSP
1.0000 | Freq: Every day | NASAL | Status: DC
  Administered 2015-11-29 – 2015-12-03 (×5): 1 via NASAL
  Filled 2015-11-28: qty 16

## 2015-11-28 MED ORDER — ACETAMINOPHEN 650 MG RE SUPP: 650.0000 mg | Freq: Four times a day (QID) | RECTAL | Status: DC | PRN

## 2015-11-28 MED ORDER — GUAIFENESIN ER 600 MG PO TB12
600.0000 mg | ORAL_TABLET | Freq: Two times a day (BID) | ORAL | Status: DC
  Administered 2015-11-29 – 2015-12-02 (×8): 600 mg via ORAL
  Filled 2015-11-28 (×8): qty 1

## 2015-11-28 MED ORDER — SODIUM CHLORIDE 0.9 % IV BOLUS (SEPSIS)
1000.0000 mL | Freq: Once | INTRAVENOUS | Status: AC
  Administered 2015-11-28: 1000 mL via INTRAVENOUS

## 2015-11-28 MED ORDER — ACETAMINOPHEN 325 MG PO TABS
650.0000 mg | ORAL_TABLET | Freq: Four times a day (QID) | ORAL | Status: DC | PRN
Start: 2015-11-28 — End: 2015-12-03

## 2015-11-28 MED ORDER — IPRATROPIUM BROMIDE 0.02 % IN SOLN
0.5000 mg | Freq: Four times a day (QID) | RESPIRATORY_TRACT | Status: DC
  Administered 2015-11-29: 0.5 mg via RESPIRATORY_TRACT
  Filled 2015-11-28: qty 2.5

## 2015-11-28 MED ORDER — PYRAZINAMIDE 500 MG PO TABS
1500.0000 mg | ORAL_TABLET | Freq: Every day | ORAL | Status: DC
  Administered 2015-11-29 (×2): 1500 mg via ORAL
  Filled 2015-11-28 (×2): qty 3

## 2015-11-28 MED ORDER — ALBUTEROL SULFATE (2.5 MG/3ML) 0.083% IN NEBU
2.5000 mg | INHALATION_SOLUTION | Freq: Four times a day (QID) | RESPIRATORY_TRACT | Status: DC
  Administered 2015-11-29: 2.5 mg via RESPIRATORY_TRACT
  Filled 2015-11-28: qty 3

## 2015-11-28 MED ORDER — ALBUTEROL SULFATE (2.5 MG/3ML) 0.083% IN NEBU: 2.5000 mg | INHALATION_SOLUTION | RESPIRATORY_TRACT | Status: DC | PRN

## 2015-11-28 MED ORDER — ONDANSETRON HCL 4 MG/2ML IJ SOLN
4.0000 mg | Freq: Four times a day (QID) | INTRAMUSCULAR | Status: DC | PRN
Start: 2015-11-28 — End: 2015-12-03

## 2015-11-28 MED ORDER — HYDROCODONE-ACETAMINOPHEN 5-325 MG PO TABS
1.0000 | ORAL_TABLET | Freq: Four times a day (QID) | ORAL | Status: DC | PRN
  Administered 2015-11-29 – 2015-11-30 (×2): 1 via ORAL
  Filled 2015-11-28 (×2): qty 1

## 2015-11-28 NOTE — ED Provider Notes (Signed)
CSN: [id number]     Arrival date & time 11/28/15  1421 History   First MD Initiated Contact with Patient 11/28/15 1606     Chief Complaint  Patient presents with  . Rib Injury   (Consider location/radiation/quality/duration/timing/severity/associated sxs/prior Treatment) HPI He is a [age] year old man here for evaluation of left rib pain. He states he is having difficulty sleeping last night and fell when he got out of bed. He states he landed on a tile floor. He reports pain in the left lateral ribs. It is worse with any sort of movement, coughing, and deep breaths. He does have a chronic cough from smoking.  Past Medical History  Diagnosis Date  . Arthritis   . Asthma   . EtOH dependence (HCC)     drinks 2-3 40s a day  . Fibula fracture     Dr Dion SaucierLandau 02-2011  . Hernia     surgery 01-17-13   Past Surgical History  Procedure Laterality Date  . Ankle surgery    . Inguinal hernia repair Right 01/19/2013    Procedure: HERNIA REPAIR INGUINAL ADULT;  Surgeon: Atilano InaEric M Wilson, MD,FACS;  Location: [facility] OR;  Service: General;  Laterality: Right;  . Tibia im nail insertion Left 01/22/2013    Procedure: INTRAMEDULLARY (IM) NAIL TIBIAL;  Surgeon: Eulas PostJoshua P Landau, MD;  Location: [facility] OR;  Service: Orthopedics;  Laterality: Left;   Family History  Problem Relation Age of Onset  . Diabetes Mother   . Diabetes Father   . Hypertension Father    Social History  Substance Use Topics  . Smoking status: Current Every Day Smoker -- 1.00 packs/day    Types: Cigarettes  . Smokeless tobacco: None     Comment: 1 ppd  . Alcohol Use: 8.4 oz/week    14 Cans of beer per week     Comment: occasionally; generally drinks (2) 40s/day    Review of Systems As in history of present illness Allergies  Review of patient's allergies indicates no known allergies.  Home Medications   Prior to Admission medications   Medication Sig Start Date End Date Taking? Authorizing Provider  cyclobenzaprine (FLEXERIL) 10 MG  tablet Take 1 tablet (10 mg total) by mouth 2 (two) times daily as needed for muscle spasms. 03/21/14   Kaitlyn Szekalski, PA-C  fluticasone (FLONASE) 50 MCG/ACT nasal spray Place 1 spray into both nostrils daily. 07/28/15   Eyvonne MechanicJeffrey Hedges, PA-C  ibuprofen (ADVIL,MOTRIN) 400 MG tablet Take 1 tablet (400 mg total) by mouth every 6 (six) hours as needed (pain). 05/13/15   Lyndal Pulleyaniel Knott, MD  levofloxacin (LEVAQUIN) 750 MG tablet Take 1 tablet (750 mg total) by mouth daily. 03/02/15   Glynn OctaveStephen Rancour, MD  loratadine-pseudoephedrine (CLARITIN-D 12 HOUR) 5-120 MG per tablet Take 1 tablet by mouth 2 (two) times daily. 05/13/15   Lyndal Pulleyaniel Knott, MD  meloxicam (MOBIC) 15 MG tablet Take 1 tablet (15 mg total) by mouth daily. 04/06/15   Emilia BeckKaitlyn Szekalski, PA-C  oxyCODONE-acetaminophen (PERCOCET/ROXICET) 5-325 MG per tablet Take 1-2 tablets by mouth every 4 (four) hours as needed for severe pain. 07/31/15   Ozella Rocksavid J Merrell, MD  VITAMIN E PO Take 1 capsule by mouth 2 (two) times daily.    Historical Provider, MD   Meds Ordered and Administered this Visit  Medications - No data to display  BP 118/74 mmHg  Pulse 122  Temp(Src) 98.2 F (36.8 C) (Oral)  SpO2 94% No data found.   Physical Exam  Constitutional: He is oriented to  person, place, and time. He appears well-developed and well-nourished. No distress.  Cardiovascular:  Tachycardic  Pulmonary/Chest: Effort normal and breath sounds normal. No respiratory distress. He has no wheezes. He has no rales.    Neurological: He is alert and oriented to person, place, and time.    ED Course  Procedures (including critical care time)  Labs Review Labs Reviewed - No data to display  Imaging Review Dg Ribs Unilateral W/chest Left  11/28/2015  CLINICAL DATA:  Smoker, fall, right rib pain EXAM: LEFT RIBS AND CHEST - 3+ VIEW COMPARISON:  CT scan 03/02/2015 FINDINGS: There is cavitary consolidation in right upper lobe although this may be due to cavitary pneumonia  malignancy cannot be excluded. There is patchy reticulonodular airspace disease for right lower lobe and left upper lobe and lingula. Multifocal infection or lymphangitic tumor spread cannot be excluded. Further correlation with CT scan of the chest is recommended. Displaced fracture of the left eighth rib. There is cortical irregularity left ninth, tenth and eleventh rib. This may be due to fracture of indeterminate age. Clinical correlation is necessary. No pneumothorax. Old right upper rib fractures. IMPRESSION: 1. Cavitary consolidation in right upper lobe may be due to cavitary pneumonia or malignancy. 2. Reticulonodular airspace disease right lower lobe lingula and left upper lobe may be due to multifocal infection or lymphangitic tumor spread. Mild displaced fracture of the left eighth rib. Cortical deformity of the left ninth 10 and 11 rib may be due to fracture of indeterminate age. Clinical correlation is necessary. No pneumothorax. Electronically Signed   By: Natasha Mead M.D.   On: 11/28/2015 17:27      MDM   1. Abnormal chest x-ray   2. Rib pain on left side    X-ray does show a displaced rib fracture. More concerning, it shows diffuse airspace disease concerning for multifocal pneumonia versus malignancy. With his history of smoking and lack of fever, I lean more towards malignancy. We'll transfer to the emergency room for additional workup and evaluation.    Charm Rings, MD 11/28/15 [phone]

## 2015-11-28 NOTE — ED Notes (Signed)
Pt tripped last night and fell onto left side was sent here from urgent care with left sided rib fractures and an abnormal chest xray. X-ray does show a displaced rib fracture and  multifocal pneumonia versus malignancy. Pt sent here to be further evaluated.

## 2015-11-28 NOTE — ED Notes (Signed)
Patient transported to CT 

## 2015-11-28 NOTE — ED Notes (Signed)
C/o left side rib injury due to falling States he fell on the floor

## 2015-11-28 NOTE — ED Provider Notes (Signed)
CSN: [id number]     Arrival date & time 11/28/15  1743 History   First MD Initiated Contact with Patient 11/28/15 1804     Chief Complaint  Patient presents with  . Rib Injury   54 year old aftermath male with past medical history asthma . Presents today from urgent care for abnormal chest x-ray. Patient endorses he has had pneumonia several times this year but does not seem to be healing well. He has a chronic cough is productive of yellow and brown sputum. He's also had 40 pounds of weight loss in 6 months time with night sweats and chills. He endorses chronic shortness of breath with this. Several days ago patient tripped getting out of his bed and fell onto his left side. Has had extensive left-sided pain since then. At urgent care today patient was diagnosed with a rib fracture the left side but also found to have an abnormal radiographic opacification of the right upper lobe of his right lung. Sent here for further evaluation. He denies CP, fever, N/V, diarrhea, constipation, hematemesis, dysuria, hematuria, sick contacts, or recent travel.   (Consider location/radiation/quality/duration/timing/severity/associated sxs/prior Treatment) Patient is a 54 y.o. male presenting with chest pain.  Chest Pain Pain location:  L chest Pain quality: aching   Pain radiates to:  Does not radiate Pain severity:  Severe Onset quality:  Sudden Duration:  3 days Timing:  Constant Progression:  Unchanged Chronicity:  New Context: breathing and movement   Relieved by:  Nothing Worsened by:  Nothing tried Ineffective treatments:  None tried Associated symptoms: cough (chronic)   Associated symptoms: no abdominal pain, no dizziness, no fever, no headache, no nausea, no palpitations, no shortness of breath and not vomiting     Past Medical History  Diagnosis Date  . Arthritis   . Asthma   . EtOH dependence (HCC)     drinks 2-3 40s a day  . Fibula fracture     Dr Dion Saucier 02-2011  . Hernia    surgery 01-17-13   Past Surgical History  Procedure Laterality Date  . Ankle surgery    . Inguinal hernia repair Right 01/19/2013    Procedure: HERNIA REPAIR INGUINAL ADULT;  Surgeon: [name], MD,FACS;  Location: [facility] OR;  Service: General;  Laterality: Right;  . Tibia im nail insertion Left 01/22/2013    Procedure: INTRAMEDULLARY (IM) NAIL TIBIAL;  Surgeon: [name], MD;  Location: [facility] OR;  Service: Orthopedics;  Laterality: Left;   Family History  Problem Relation Age of Onset  . Diabetes Mother   . Diabetes Father   . Hypertension Father    Social History  Substance Use Topics  . Smoking status: Current Every Day Smoker -- 1.00 packs/day    Types: Cigarettes  . Smokeless tobacco: None     Comment: 1 ppd  . Alcohol Use: 8.4 oz/week    14 Cans of beer per week     Comment: occasionally; generally drinks (2) 40s/day    Review of Systems  Constitutional: Positive for chills (night sweats) and unexpected weight change (lost 40 lbs in 6 months). Negative for fever.  Respiratory: Positive for cough (chronic). Negative for shortness of breath.   Cardiovascular: Positive for chest pain (left ribs). Negative for palpitations and leg swelling.  Gastrointestinal: Negative for nausea, vomiting, abdominal pain, diarrhea, constipation and abdominal distention.  Genitourinary: Negative for dysuria, frequency, flank pain and decreased urine volume.  Skin: Negative for wound.  Neurological: Negative for dizziness, speech difficulty, light-headedness and headaches.  All other systems reviewed and are negative.     Allergies  Review of patient's allergies indicates no known allergies.  Home Medications   Prior to Admission medications   Medication Sig Start Date End Date Taking? Authorizing Provider  cyclobenzaprine (FLEXERIL) 10 MG tablet Take 1 tablet (10 mg total) by mouth 2 (two) times daily as needed for muscle spasms. 03/21/14   Kaitlyn Szekalski, PA-C  fluticasone  (FLONASE) 50 MCG/ACT nasal spray Place 1 spray into both nostrils daily. 07/28/15   Eyvonne Mechanic, PA-C  ibuprofen (ADVIL,MOTRIN) 400 MG tablet Take 1 tablet (400 mg total) by mouth every 6 (six) hours as needed (pain). 05/13/15   Lyndal Pulley, MD  levofloxacin (LEVAQUIN) 750 MG tablet Take 1 tablet (750 mg total) by mouth daily. 03/02/15   Glynn Octave, MD  loratadine-pseudoephedrine (CLARITIN-D 12 HOUR) 5-120 MG per tablet Take 1 tablet by mouth 2 (two) times daily. 05/13/15   Lyndal Pulley, MD  meloxicam (MOBIC) 15 MG tablet Take 1 tablet (15 mg total) by mouth daily. 04/06/15   Emilia Beck, PA-C  oxyCODONE-acetaminophen (PERCOCET/ROXICET) 5-325 MG per tablet Take 1-2 tablets by mouth every 4 (four) hours as needed for severe pain. 07/31/15   Ozella Rocks, MD  SPIRIVA RESPIMAT 2.5 MCG/ACT AERS Inhale 2 sprays into the lungs daily. 11/24/15   Historical Provider, MD   BP 114/75 mmHg  Pulse 82  Temp(Src) 98.7 F (37.1 C) (Oral)  Resp 18  Ht 6\' 1"  (1.854 m)  Wt 68.2 kg  BMI 19.84 kg/m2  SpO2 100% Physical Exam  Constitutional: He is oriented to person, place, and time. He appears well-developed and well-nourished. No distress.  HENT:  Head: Normocephalic and atraumatic.  Eyes: Pupils are equal, round, and reactive to light.  Neck: Normal range of motion.  Cardiovascular: Normal rate, regular rhythm, normal heart sounds and intact distal pulses.  Exam reveals no gallop and no friction rub.   No murmur heard. Pulmonary/Chest: Effort normal. No respiratory distress. He has no wheezes. He has rales. He exhibits tenderness (left).  Abdominal: Soft. Bowel sounds are normal. He exhibits no distension and no mass. There is no tenderness. There is no rebound and no guarding.  Musculoskeletal: Normal range of motion.  Lymphadenopathy:    He has no cervical adenopathy.  Neurological: He is oriented to person, place, and time. No cranial nerve deficit. Coordination normal.  Skin: Skin is warm  and dry. He is not diaphoretic.  Nursing note and vitals reviewed.   ED Course  Procedures (including critical care time) Labs Review Labs Reviewed  CBC WITH DIFFERENTIAL/PLATELET - Abnormal; Notable for the following:    WBC 15.2 (*)    RBC 3.53 (*)    Hemoglobin 7.6 (*)    HCT 25.1 (*)    MCV 71.1 (*)    MCH 21.5 (*)    RDW 19.6 (*)    Platelets 604 (*)    Neutro Abs 12.5 (*)    Monocytes Absolute 1.5 (*)    All other components within normal limits  C-REACTIVE PROTEIN - Abnormal; Notable for the following:    CRP 19.8 (*)    All other components within normal limits  SEDIMENTATION RATE - Abnormal; Notable for the following:    Sed Rate 86 (*)    All other components within normal limits  I-STAT CHEM 8, ED - Abnormal; Notable for the following:    Sodium 130 (*)    Chloride 96 (*)    Glucose, Bld 109 (*)  Calcium, Ion 1.26 (*)    Hemoglobin 9.5 (*)    HCT 28.0 (*)    All other components within normal limits  AFB CULTURE WITH SMEAR  CULTURE, RESPIRATORY (NON-EXPECTORATED)  TSH  RAPID HIV SCREEN (HIV 1/2 AB+AG)  CBC  COMPREHENSIVE METABOLIC PANEL    Imaging Review Dg Ribs Unilateral W/chest Left  11/28/2015  CLINICAL DATA:  Smoker, fall, right rib pain EXAM: LEFT RIBS AND CHEST - 3+ VIEW COMPARISON:  CT scan 03/02/2015 FINDINGS: There is cavitary consolidation in right upper lobe although this may be due to cavitary pneumonia malignancy cannot be excluded. There is patchy reticulonodular airspace disease for right lower lobe and left upper lobe and lingula. Multifocal infection or lymphangitic tumor spread cannot be excluded. Further correlation with CT scan of the chest is recommended. Displaced fracture of the left eighth rib. There is cortical irregularity left ninth, tenth and eleventh rib. This may be due to fracture of indeterminate age. Clinical correlation is necessary. No pneumothorax. Old right upper rib fractures. IMPRESSION: 1. Cavitary consolidation in  right upper lobe may be due to cavitary pneumonia or malignancy. 2. Reticulonodular airspace disease right lower lobe lingula and left upper lobe may be due to multifocal infection or lymphangitic tumor spread. Mild displaced fracture of the left eighth rib. Cortical deformity of the left ninth 10 and 11 rib may be due to fracture of indeterminate age. Clinical correlation is necessary. No pneumothorax. Electronically Signed   By: Natasha MeadLiviu  Pop M.D.   On: 11/28/2015 17:27   Ct Chest W Contrast  11/28/2015  CLINICAL DATA:  Chest/rib pain status post fall. Recent pneumonia with abnormal radiographs concerning for cavitary pneumonia or malignancy. EXAM: CT CHEST WITH CONTRAST TECHNIQUE: Multidetector CT imaging of the chest was performed during intravenous contrast administration. CONTRAST:  75mL OMNIPAQUE IOHEXOL 300 MG/ML  SOLN COMPARISON:  Chest radiographs 03/23/2014 and 11/28/2015. CT 03/02/2015. FINDINGS: Mediastinum/Nodes: There are possible mildly enlarged right hilar lymph nodes, likely reactive. No enlarged mediastinal or axillary lymph nodes are identified. The thyroid gland, trachea and esophagus demonstrate no significant findings. The heart size is normal. There is no pericardial effusion. There are no significant vascular findings. Lungs/Pleura: There is minimal pleural fluid on the right. There is volume loss in the right hemithorax with mediastinal shift to the right. As demonstrated on today's radiographs, there has been significant progression in the previously demonstrated irregular pulmonary nodules on chest CT. There are multiple cavitary masses in both lungs. The largest occupies the right apex and measures up to 7.0 x 5.9 cm transverse on image 13. There are multiple other cavitary masses, including 1 in the right lower lobe measuring 4.9 x 3.8 cm on image 45 and 1 in the left upper lobe measuring up to 2.9 cm on image 8. There are multiple other peribronchial opacities with a tree-in-bud  distribution which have progressed compared with the prior study. There is central bronchiectasis, especially in the right upper lobe. Upper abdomen:  Unremarkable.  There is no adrenal mass. Musculoskeletal/Chest wall: Acute fracture of the left seventh rib laterally is noted on image 46. No evidence of pneumothorax or suspicious osseous finding. IMPRESSION: 1. Since the prior chest CT of 9 months ago, there has been significant progression of irregular nodular airspace disease in both lungs. There are multiple large cavitary masses, largest at the right apex with there is associated volume loss. These findings are worrisome for reactivation tuberculosis. Other less likely considerations include fungal infection and cavitary metastases. Respiratory isolation  recommended. 2. Probable small reactive lymph nodes in the right hilum. No significant pleural effusion. 3. Left seventh rib fracture without pneumothorax. 4. These results were called by telephone at the time of interpretation on 11/28/2015 at 8:30 pm to Dr. Rachelle Hora , who verbally acknowledged these results. Electronically Signed   By: Carey Bullocks M.D.   On: 11/28/2015 20:33   I have personally reviewed and evaluated these images and lab results as part of my medical decision-making.   EKG Interpretation None      MDM   Final diagnoses:  TB (pulmonary tuberculosis)   54 year old African-American male who presents from urgent care for evaluation for abnormal chest x-ray. With weight loss, smoking history, an x-ray finding, concerned for malignancy. CT chest sent off.  Fentanyl given for chest pain for rib fracture.  CT findings consistent with TB. Patient moved to an isolated room with airborne precautions. Started on quadruple antibiotic therapy. Upon reevaluation notification of this news, patient endorses that he had TB several years ago and he lived in [place]. 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 He also says  that he was treated at that time and was never rechecked.  Patient will be admitted to the hospital service for continued treatment as he shown he has anemia as well as hyponatremia. Do not feel like he would do well as outpatient at this time. All of his family members and contact me to be checked for TB/be treated for Tb exposure.  Pt was seen under the supervision of Dr. Madilyn Hook.     Rachelle Hora, MD 11/29/15 1610  Tilden Fossa, MD 11/30/15 [phone]

## 2015-11-28 NOTE — Progress Notes (Signed)
Sputum cup at patient bedside, told patient when he coughs up sputum to notify RT or RN and we will send it to lab.

## 2015-11-29 ENCOUNTER — Encounter (HOSPITAL_COMMUNITY): Payer: Self-pay | Admitting: General Practice

## 2015-11-29 DIAGNOSIS — E43 Unspecified severe protein-calorie malnutrition: Secondary | ICD-10-CM | POA: Diagnosis present

## 2015-11-29 DIAGNOSIS — F17210 Nicotine dependence, cigarettes, uncomplicated: Secondary | ICD-10-CM | POA: Diagnosis present

## 2015-11-29 DIAGNOSIS — E440 Moderate protein-calorie malnutrition: Secondary | ICD-10-CM | POA: Diagnosis present

## 2015-11-29 DIAGNOSIS — Z8611 Personal history of tuberculosis: Secondary | ICD-10-CM

## 2015-11-29 DIAGNOSIS — A150 Tuberculosis of lung: Principal | ICD-10-CM

## 2015-11-29 DIAGNOSIS — D509 Iron deficiency anemia, unspecified: Secondary | ICD-10-CM | POA: Diagnosis present

## 2015-11-29 DIAGNOSIS — J984 Other disorders of lung: Secondary | ICD-10-CM

## 2015-11-29 DIAGNOSIS — R634 Abnormal weight loss: Secondary | ICD-10-CM

## 2015-11-29 DIAGNOSIS — F1029 Alcohol dependence with unspecified alcohol-induced disorder: Secondary | ICD-10-CM

## 2015-11-29 DIAGNOSIS — Z720 Tobacco use: Secondary | ICD-10-CM

## 2015-11-29 DIAGNOSIS — S2232XA Fracture of one rib, left side, initial encounter for closed fracture: Secondary | ICD-10-CM | POA: Diagnosis present

## 2015-11-29 LAB — COMPREHENSIVE METABOLIC PANEL
ALBUMIN: 1.7 g/dL — AB (ref 3.5–5.0)
ALT: 14 U/L — AB (ref 17–63)
AST: 13 U/L — AB (ref 15–41)
Alkaline Phosphatase: 110 U/L (ref 38–126)
Anion gap: 9 (ref 5–15)
BUN: 16 mg/dL (ref 6–20)
CHLORIDE: 102 mmol/L (ref 101–111)
CO2: 24 mmol/L (ref 22–32)
CREATININE: 1.09 mg/dL (ref 0.61–1.24)
Calcium: 9.4 mg/dL (ref 8.9–10.3)
GFR calc Af Amer: >60 mL/min (ref >60)
GFR calc non Af Amer: >60 mL/min (ref >60)
GLUCOSE: 116 mg/dL — AB (ref 65–99)
Potassium: 4 mmol/L (ref 3.5–5.1)
SODIUM: 135 mmol/L (ref 135–145)
Total Bilirubin: 0.9 mg/dL (ref 0.3–1.2)
Total Protein: 5.9 g/dL — ABNORMAL LOW (ref 6.5–8.1)

## 2015-11-29 LAB — CBC WITH DIFFERENTIAL/PLATELET
BASOS ABS: 0 10*3/uL (ref 0.0–0.1)
Basophils Relative: 0 %
EOS ABS: 0.1 10*3/uL (ref 0.0–0.7)
Eosinophils Relative: 1 %
HCT: 25 % — ABNORMAL LOW (ref 39.0–52.0)
Hemoglobin: 7.6 g/dL — ABNORMAL LOW (ref 13.0–17.0)
Lymphocytes Relative: 4 %
Lymphs Abs: 0.6 10*3/uL — ABNORMAL LOW (ref 0.7–4.0)
MCH: 21.9 pg — AB (ref 26.0–34.0)
MCHC: 30.4 g/dL (ref 30.0–36.0)
MCV: 72 fL — ABNORMAL LOW (ref 78.0–100.0)
MONO ABS: 1.6 10*3/uL — AB (ref 0.1–1.0)
Monocytes Relative: 11 %
NEUTROS PCT: 84 %
Neutro Abs: 11.9 10*3/uL — ABNORMAL HIGH (ref 1.7–7.7)
PLATELETS: 582 10*3/uL — AB (ref 150–400)
RBC: 3.47 MIL/uL — AB (ref 4.22–5.81)
RDW: 19.6 % — ABNORMAL HIGH (ref 11.5–15.5)
WBC: 14.2 10*3/uL — AB (ref 4.0–10.5)

## 2015-11-29 LAB — HIV ANTIBODY (ROUTINE TESTING W REFLEX): HIV SCREEN 4TH GENERATION: NONREACTIVE

## 2015-11-29 LAB — CBC
HCT: 24.4 % — ABNORMAL LOW (ref 39.0–52.0)
Hemoglobin: 7.6 g/dL — ABNORMAL LOW (ref 13.0–17.0)
MCH: 22.2 pg — ABNORMAL LOW (ref 26.0–34.0)
MCHC: 31.1 g/dL (ref 30.0–36.0)
MCV: 71.3 fL — ABNORMAL LOW (ref 78.0–100.0)
Platelets: 594 10*3/uL — ABNORMAL HIGH (ref 150–400)
RBC: 3.42 MIL/uL — ABNORMAL LOW (ref 4.22–5.81)
RDW: 19.7 % — ABNORMAL HIGH (ref 11.5–15.5)
WBC: 12.4 10*3/uL — ABNORMAL HIGH (ref 4.0–10.5)

## 2015-11-29 LAB — C-REACTIVE PROTEIN: CRP: 19.8 mg/dL — ABNORMAL HIGH (ref <1.0)

## 2015-11-29 LAB — SEDIMENTATION RATE: Sed Rate: 86 mm/hr — ABNORMAL HIGH (ref 0–16)

## 2015-11-29 LAB — TSH: TSH: 3.844 u[IU]/mL (ref 0.350–4.500)

## 2015-11-29 MED ORDER — THIAMINE HCL 100 MG/ML IJ SOLN: 100.0000 mg | Freq: Every day | INTRAMUSCULAR | Status: DC

## 2015-11-29 MED ORDER — LORAZEPAM 1 MG PO TABS
1.0000 mg | ORAL_TABLET | Freq: Four times a day (QID) | ORAL | Status: AC | PRN
  Administered 2015-12-01: 1 mg via ORAL
  Filled 2015-11-29: qty 1

## 2015-11-29 MED ORDER — THIAMINE HCL 100 MG/ML IJ SOLN
Freq: Once | INTRAVENOUS | Status: AC
  Administered 2015-11-29: 02:00:00 via INTRAVENOUS
  Filled 2015-11-29: qty 1000

## 2015-11-29 MED ORDER — IPRATROPIUM-ALBUTEROL 0.5-2.5 (3) MG/3ML IN SOLN
3.0000 mL | Freq: Four times a day (QID) | RESPIRATORY_TRACT | Status: DC
  Administered 2015-11-29: 3 mL via RESPIRATORY_TRACT
  Filled 2015-11-29: qty 3

## 2015-11-29 MED ORDER — IPRATROPIUM-ALBUTEROL 0.5-2.5 (3) MG/3ML IN SOLN: 3.0000 mL | Freq: Four times a day (QID) | RESPIRATORY_TRACT | Status: DC | PRN

## 2015-11-29 MED ORDER — VITAMIN B-1 100 MG PO TABS
100.0000 mg | ORAL_TABLET | Freq: Every day | ORAL | Status: DC
  Administered 2015-11-29 – 2015-12-03 (×5): 100 mg via ORAL
  Filled 2015-11-29 (×5): qty 1

## 2015-11-29 MED ORDER — FOLIC ACID 1 MG PO TABS
1.0000 mg | ORAL_TABLET | Freq: Every day | ORAL | Status: DC
  Administered 2015-11-29 – 2015-12-03 (×5): 1 mg via ORAL
  Filled 2015-11-29 (×5): qty 1

## 2015-11-29 MED ORDER — BENZONATATE 100 MG PO CAPS
200.0000 mg | ORAL_CAPSULE | Freq: Two times a day (BID) | ORAL | Status: DC | PRN
  Administered 2015-11-29: 200 mg via ORAL
  Filled 2015-11-29: qty 2

## 2015-11-29 MED ORDER — ADULT MULTIVITAMIN W/MINERALS CH
1.0000 | ORAL_TABLET | Freq: Every day | ORAL | Status: DC
Start: 2015-11-29 — End: 2015-12-03
  Administered 2015-11-29 – 2015-12-03 (×5): 1 via ORAL
  Filled 2015-11-29 (×5): qty 1

## 2015-11-29 MED ORDER — LORAZEPAM 2 MG/ML IJ SOLN: 1.0000 mg | Freq: Four times a day (QID) | INTRAMUSCULAR | Status: AC | PRN

## 2015-11-29 NOTE — Consult Note (Signed)
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 Kathlen ModyVijaya Akula  Principal Problem:   Cavitary lesion of lung Active Problems:   History of tuberculosis   EtOH dependence (HCC)   Left rib fracture   Microcytic anemia   Cigarette smoker   Protein-calorie malnutrition, severe (HCC)   Unintentional weight loss   . fluticasone  1 spray Each Nare Daily  . folic acid  1 mg Oral Daily  . guaiFENesin  600 mg Oral BID  . multivitamin with minerals  1 tablet Oral Daily  . vitamin B-6  100 mg Oral Daily  . thiamine  100 mg Oral Daily  . tiotropium  18 mcg Inhalation Daily    Recommendations: 1. Observe off of antibiotics 2. Sputum AFB smear and culture 2 3. Sputum for Gram stain and routine culture 4. HIV antibody   Assessment: Frederick [name] has extensive cavitary infiltrates bilaterally on chest x-ray and CT scan at have progressed from a CT scan this past March. Of course, this could be reactivation tuberculosis but there is no indication for empiric TB therapy. We need to obtain sputum for routine, AFB and fungal cultures. Frederick [name] is not acutely ill so I will not start any empiric therapy for community-acquired pneumonia or aspiration pneumonia. There is also the possibility that Frederick [name] could have malignancy with cavitation. In sputum stains and cultures did not reveal an obvious diagnosis Frederick [name] may need bronchoscopy.    HPI: Frederick [name] is a 54 y.o. male who tripped while walking in his bedroom in the dark. Frederick [name] fell and fractured his left ninth, 10th and 11th ribs leading to admission yesterday. Admission chest x-ray revealed a new cavitary infiltrates confirmed by CT scan. Frederick [name] has a history of being treated for pulmonary tuberculosis [place] living [place] ago. Frederick [name] recalls having directly observed therapy by a health department nurse for at least 6  months.  About one year ago Frederick [name] began to lose his appetite and has lost 30 pounds unintentionally over the past year. For the past month Frederick [name] has had worsening cough productive of yellow-green sputum. Frederick [name] is also started having night sweats over the past week.   Review of Systems: Review of Systems  Constitutional: Positive for weight loss, malaise/fatigue and diaphoresis. Negative for fever and chills.  HENT: Negative for sore throat.   Respiratory: Positive for cough, sputum production and shortness of breath. Negative for hemoptysis.   Cardiovascular: Positive for chest pain.  Gastrointestinal: Negative for nausea, vomiting, abdominal pain, diarrhea and constipation.  Genitourinary: Negative for dysuria.  Musculoskeletal: Positive for joint pain. Negative for myalgias.       Frederick [name] has chronic right shoulder pain following surgery for upper arm fracture.  Skin: Negative for rash.  Neurological: Negative for headaches.  Psychiatric/Behavioral: Negative for depression and substance abuse. The patient is not nervous/anxious.     Past Medical History  Diagnosis Date  . Arthritis   . Asthma   . EtOH dependence (HCC)     drinks 2-3 40s a day  . Fibula fracture     Dr Dion SaucierLandau 02-2011  . Hernia     surgery 01-17-13  . Tuberculosis     Social History  Substance Use Topics  . Smoking status: Current Every Day Smoker -- 1.00 packs/day  Types: Cigarettes  . Smokeless tobacco: None     Comment: 1 ppd  . Alcohol Use: 8.4 oz/week    14 Cans of beer per week     Comment: occasionally; generally drinks (2) 40s/day    Family History  Problem Relation Age of Onset  . Diabetes Mother   . Diabetes Father   . Hypertension Father    No Known Allergies  OBJECTIVE: Blood pressure 113/69, pulse 102, temperature 97.5 F (36.4 C), temperature source Oral, resp. rate 18, height  (1.854 m), weight 150 lb 5.7 oz (68.2 kg), SpO2 96 %.  Physical Exam  Constitutional: Frederick [name] is oriented to person,  place, and time.  Frederick [name] is alert and in no distress. A friend is visiting.  HENT:  Mouth/Throat: No oropharyngeal exudate.  Teeth are in fair condition.  Eyes: Conjunctivae are normal.  Cardiovascular: Normal rate and regular rhythm.   No murmur heard. Pulmonary/Chest: Effort normal. No respiratory distress. Frederick [name] has no wheezes. Frederick [name] has rales.  Abdominal: Soft. Frederick [name] exhibits no mass. There is no tenderness.  Musculoskeletal: Normal range of motion.  Neurological: Frederick [name] is alert and oriented to person, place, and time.  Skin: No rash noted.  Psychiatric: Mood and affect normal.    Lab Results Lab Results  Component Value Date   WBC 14.2* 11/29/2015   HGB 7.6* 11/29/2015   HCT 25.0* 11/29/2015   MCV 72.0* 11/29/2015   PLT 582* 11/29/2015    Lab Results  Component Value Date   CREATININE 1.09 11/29/2015   BUN 16 11/29/2015   NA 135 11/29/2015   K 4.0 11/29/2015   CL 102 11/29/2015   CO2 24 11/29/2015    Lab Results  Component Value Date   ALT 14* 11/29/2015   AST 13* 11/29/2015   ALKPHOS 110 11/29/2015   BILITOT 0.9 11/29/2015     Microbiology: No results found for this or any previous visit (from the past 240 hour(s)).  Cliffton Asters, MD [facility] [phone] pager   [phone] cell 11/29/2015, 2:37 PM

## 2015-11-29 NOTE — Consult Note (Signed)
Name: Frederick [name] MRN: [id number] DOB: [birth year]    ADMISSION DATE:  11/28/2015 CONSULTATION DATE:  11/29/15  REFERRING MD :  [place]/ [facility]  CHIEF COMPLAINT:  Left chest pain after fall  BRIEF PATIENT DESCRIPTION:  3 yoM smoker with a past medical history significant for TB, tobacco abuse, alcohol abuse, asthma/COPD; who presents with complaints of left-sided pain after falling at home while trying to get out of bed. Also describes signigicant weight loss, productive cough, at least one soaking night sweat over last few months. CT shows progressive cavitary nodular parenchymal disease. Treated TB 15 years ago in [place], [place].   SIGNIFICANT EVENTS    STUDIES:  CT chest 12/23   HISTORY OF PRESENT ILLNESS:  Treated in [place] about 15 years ago with pills (DOT?) and house confined x 6 months for TB. Long term 1/2 ppd smoker, ETOH, married living with wife. 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 Hosp 3/16 with SBO. Now admitted after he tripped and fell getting out of bed, sustaining L rib pain, leading to CXR/CT. Admits significant weight loss, persistent productive cough/ white-yellow, progressive DOE/ weakness, at least one soaking night sweat over last few months.  Comparison chest CTs reviewed by me show progression of nodular infiltrates now to severe parenchymal disease with areas of cavitation. He can't name his PCP. No respiratory cultures available prior to this admit. Evaluated for SBO 2014. Multiple ER visits.  PAST MEDICAL HISTORY :   has a past medical history of Arthritis; Asthma; EtOH dependence (HCC); Fibula fracture; Hernia; and Tuberculosis.  has past surgical history that includes Ankle surgery; Inguinal hernia repair (Right, 01/19/2013); and Tibia IM nail insertion (Left, 01/22/2013). Prior to Admission medications   Medication Sig Start Date End Date Taking? Authorizing Provider  cyclobenzaprine (FLEXERIL) 10 MG tablet Take 1 tablet (10 mg total) by mouth 2 (two) times daily as  needed for muscle spasms. 03/21/14   Kaitlyn Szekalski, PA-C  fluticasone (FLONASE) 50 MCG/ACT nasal spray Place 1 spray into both nostrils daily. 07/28/15   Eyvonne Mechanic, PA-C  ibuprofen (ADVIL,MOTRIN) 400 MG tablet Take 1 tablet (400 mg total) by mouth every 6 (six) hours as needed (pain). 05/13/15   Lyndal Pulley, MD  levofloxacin (LEVAQUIN) 750 MG tablet Take 1 tablet (750 mg total) by mouth daily. 03/02/15   Glynn Octave, MD  loratadine-pseudoephedrine (CLARITIN-D 12 HOUR) 5-120 MG per tablet Take 1 tablet by mouth 2 (two) times daily. 05/13/15   Lyndal Pulley, MD  meloxicam (MOBIC) 15 MG tablet Take 1 tablet (15 mg total) by mouth daily. 04/06/15   Emilia Beck, PA-C  oxyCODONE-acetaminophen (PERCOCET/ROXICET) 5-325 MG per tablet Take 1-2 tablets by mouth every 4 (four) hours as needed for severe pain. 07/31/15   Ozella Rocks, MD  SPIRIVA RESPIMAT 2.5 MCG/ACT AERS Inhale 2 sprays into the lungs daily. 11/24/15   Historical Provider, MD   No Known Allergies  FAMILY HISTORY:  family history includes Diabetes in his father and mother; Hypertension in his father. SOCIAL HISTORY:  reports that he has been smoking Cigarettes.  He has been smoking about 1.00 pack per day. He does not have any smokeless tobacco history on file. He reports that he drinks about 8.4 oz of alcohol per week. He reports that he does not use illicit drugs.  REVIEW OF SYSTEMS:   ROS-see HPI   Negative unless "+" Constitutional:    +weight loss, +night sweats, fevers, chills fatigue, lassitude. HEENT:    headaches, difficulty swallowing, tooth/dental problems, +sore throat,  sneezing, itching, ear ache, nasal congestion, post nasal drip, snoring CV:    + left chest wall pain, orthopnea, PND, swelling in lower extremities, anasarca,                                                  dizziness, palpitations Resp:   +shortness of breath with exertion or at rest. + productive cough,   non-productive cough, coughing up of  blood.              +change in color of mucus.  wheezing.   Skin:    rash or lesions. GI:  No-   heartburn, indigestion, abdominal pain, nausea, vomiting, diarrhea,                 change in bowel habits, loss of appetite GU: dysuria, change in color of urine, no urgency or frequency.   flank pain. MS:   +joint pain, stiffness, decreased range of motion, back pain. Neuro-     nothing unusual Psych:  change in mood or affect.  depression or anxiety.   memory loss.    SUBJECTIVE:   VITAL SIGNS: Temp:  [97.5 F (36.4 C)-98.7 F (37.1 C)] 97.5 F (36.4 C) (12/24 0905) Pulse Rate:  [82-122] 102 (12/24 0905) Resp:  [18-19] 18 (12/24 0905) BP: (91-119)/(65-83) 113/69 mmHg (12/24 0905) SpO2:  [93 %-100 %] 96 % (12/24 0905) FiO2 (%):  [21 %] 21 % (12/23 2329) Weight:  [68.04 kg (150 lb)-68.2 kg (150 lb 5.7 oz)] 68.2 kg (150 lb 5.7 oz) (12/24 0000)  PHYSICAL EXAMINATION: General:  Alert, NAD, Oriented, appropriate  Male, lying in bed, friend in room Neuro:  nonfocal HEENT:  Teeth in good repair, mucosa clear,  Cardiovascular:  RRR, no mgr Lungs: Unlabored, few crackeles Abdomen: scaphoid w/o HSM Musculoskeletal:  Weak, required assistance to sit Skin:  No rash or bruising   Recent Labs Lab 11/28/15 1852 11/29/15 0521  NA 130* 135  K 4.0 4.0  CL 96* 102  CO2  --  24  BUN 17 16  CREATININE 0.90 1.09  GLUCOSE 109* 116*    Recent Labs Lab 11/28/15 1852 11/29/15 0521 11/29/15 0925  HGB 7.6*  9.5* 7.6* 7.6*  HCT 25.1*  28.0* 24.4* 25.0*  WBC 15.2* 12.4* 14.2*  PLT 604* 594* 582*   Dg Ribs Unilateral W/chest Left  11/28/2015  CLINICAL DATA:  Smoker, fall, right rib pain EXAM: LEFT RIBS AND CHEST - 3+ VIEW COMPARISON:  CT scan 03/02/2015 FINDINGS: There is cavitary consolidation in right upper lobe although this may be due to cavitary pneumonia malignancy cannot be excluded. There is patchy reticulonodular airspace disease for right lower lobe and left upper lobe and  lingula. Multifocal infection or lymphangitic tumor spread cannot be excluded. Further correlation with CT scan of the chest is recommended. Displaced fracture of the left eighth rib. There is cortical irregularity left ninth, tenth and eleventh rib. This may be due to fracture of indeterminate age. Clinical correlation is necessary. No pneumothorax. Old right upper rib fractures. IMPRESSION: 1. Cavitary consolidation in right upper lobe may be due to cavitary pneumonia or malignancy. 2. Reticulonodular airspace disease right lower lobe lingula and left upper lobe may be due to multifocal infection or lymphangitic tumor spread. Mild displaced fracture of the left eighth rib. Cortical deformity of the left ninth 10  and 11 rib may be due to fracture of indeterminate age. Clinical correlation is necessary. No pneumothorax. Electronically Signed   By: Natasha Mead M.D.   On: 11/28/2015 17:27   Ct Chest W Contrast  11/28/2015  CLINICAL DATA:  Chest/rib pain status post fall. Recent pneumonia with abnormal radiographs concerning for cavitary pneumonia or malignancy. EXAM: CT CHEST WITH CONTRAST TECHNIQUE: Multidetector CT imaging of the chest was performed during intravenous contrast administration. CONTRAST:  75mL OMNIPAQUE IOHEXOL 300 MG/ML  SOLN COMPARISON:  Chest radiographs 03/23/2014 and 11/28/2015. CT 03/02/2015. FINDINGS: Mediastinum/Nodes: There are possible mildly enlarged right hilar lymph nodes, likely reactive. No enlarged mediastinal or axillary lymph nodes are identified. The thyroid gland, trachea and esophagus demonstrate no significant findings. The heart size is normal. There is no pericardial effusion. There are no significant vascular findings. Lungs/Pleura: There is minimal pleural fluid on the right. There is volume loss in the right hemithorax with mediastinal shift to the right. As demonstrated on today's radiographs, there has been significant progression in the previously demonstrated  irregular pulmonary nodules on chest CT. There are multiple cavitary masses in both lungs. The largest occupies the right apex and measures up to 7.0 x 5.9 cm transverse on image 13. There are multiple other cavitary masses, including 1 in the right lower lobe measuring 4.9 x 3.8 cm on image 45 and 1 in the left upper lobe measuring up to 2.9 cm on image 8. There are multiple other peribronchial opacities with a tree-in-bud distribution which have progressed compared with the prior study. There is central bronchiectasis, especially in the right upper lobe. Upper abdomen:  Unremarkable.  There is no adrenal mass. Musculoskeletal/Chest wall: Acute fracture of the left seventh rib laterally is noted on image 46. No evidence of pneumothorax or suspicious osseous finding. IMPRESSION: 1. Since the prior chest CT of 9 months ago, there has been significant progression of irregular nodular airspace disease in both lungs. There are multiple large cavitary masses, largest at the right apex with there is associated volume loss. These findings are worrisome for reactivation tuberculosis. Other less likely considerations include fungal infection and cavitary metastases. Respiratory isolation recommended. 2. Probable small reactive lymph nodes in the right hilum. No significant pleural effusion. 3. Left seventh rib fracture without pneumothorax. 4. These results were called by telephone at the time of interpretation on 11/28/2015 at 8:30 pm to Dr. Rachelle Hora , who verbally acknowledged these results. Electronically Signed   By: Carey Bullocks M.D.   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 I agree with Dr Campbell/ ID that he is not acutely ill and we have time to confirm dx. I expect positive AFB smears on sputum, but if not readily forthcoming he should be bronched.  I have seen mixed  TB and Cancer with this picture.   Active Problems:  History of tuberculosis- records from [facility] might be obtainable, but probably on micro-fiche.  EtOH dependence (HCC)- risk for DTs compounded in isolation room  Left rib fracture  Microcytic anemia  Cigarette smoker- counseling begun  Protein-calorie malnutrition, severe (HCC)  Unintentional weight loss  Audree Bane, MD Pulmonary and Critical Care Medicine [facility] [phone] After hours Pager: [phone]  11/29/2015, 2:32 PM

## 2015-11-29 NOTE — Progress Notes (Signed)
54 year old gentleman withh/o TB admitted for left sided chest wall pain after a fall. He was found to have rib fracture on theleft and abnormal CT chest suspicious for reactivation TB vs Malignancy.   ID and pulm consulted and sputum cultures ordered.  Kathlen ModyVijaya Wilena Tyndall, MD [phone]

## 2015-11-29 NOTE — H&P (Addendum)
[facility] History and Physical  Frederick [name] YEB:[id number] DOB: 1961-09-24 DOA: 11/28/2015  Referring physician: ED PCP: No PCP Per Patient   Chief Complaint: left side pain after fall  HPI:  Frederick [name] is a 54 year old male with a past medical history significant for TB, tobacco abuse, alcohol abuse, asthma/COPD; who presents with complaints of left-sided pain after falling at home while trying to get out of bed. Patient notes that he likely tripped and landed on his left side. Reports excruciating pain on the left side with deep breaths in. Upon admission to the emergency department chest x-ray reveals a left rib fracture as well as cavitary consolidation in right upper lung suspicious for cavitary pneumonia versus malignancy. Patient is notified about this and then states that he had TB approximately 15 years ago when he lived in [place]. He notes going to [facility] possibly for treatment. States that he had to stay in home possibly for about 8 months , but does not recall the medications he took or the exact length of time. Patient notes that he had pneumonia back in May of this year and felt like it never completely resolved. Over the last 3 weeks he has reported increased cough with phlegm. Tried over-the-counter cough medicine without relief of symptoms. Patient notes associated symptoms of weight loss he went from 180 pounds down to 150 over the last 6 months. Currently denies any fever,  urinary frequency, abdominal pain, nausea, vomiting, diarrhea,  or palpitations. Patient notes that he still continues to smoke approximately half a pack a day now, and has done so for at least 30 years. 8. Generally drinks about 40 ounces of beer daily. Denies ever having withdrawal seizures. Also reports associated symptoms of easily fatigued ,chills,and just generalized malaise.   ROS Otherwise a complete 12 point review of systems was completed and negative except for as noted  above in history of present illness   Past Medical History  Diagnosis Date  . Arthritis   . Asthma   . EtOH dependence (HCC)     drinks 2-3 40s a day  . Fibula fracture     Dr Mardelle Matte 02-2011  . Hernia     surgery 01-17-13     Past Surgical History  Procedure Laterality Date  . Ankle surgery    . Inguinal hernia repair Right 01/19/2013    Procedure: HERNIA REPAIR INGUINAL ADULT;  Surgeon: Gayland Curry, MD,FACS;  Location: [facility];  Service: General;  Laterality: Right;  . Tibia im nail insertion Left 01/22/2013    Procedure: INTRAMEDULLARY (IM) NAIL TIBIAL;  Surgeon: Johnny Bridge, MD;  Location: [facility];  Service: Orthopedics;  Laterality: Left;      Social History:  reports that he has been smoking Cigarettes.  He has been smoking about 1.00 pack per day. He does not have any smokeless tobacco history on file. He reports that he drinks about 8.4 oz of alcohol per week. He reports that he does not use illicit drugs. Where does patient live--home Can patient participate in ADLs? YES  No Known Allergies  Family History  Problem Relation Age of Onset  . Diabetes Mother   . Diabetes Father   . Hypertension Father         Prior to Admission medications   Medication Sig Start Date End Date Taking? Authorizing Provider  cyclobenzaprine (FLEXERIL) 10 MG tablet Take 1 tablet (10 mg total) by mouth 2 (two) times daily as needed for muscle spasms.  03/21/14   Kaitlyn Szekalski, PA-C  fluticasone (FLONASE) 50 MCG/ACT nasal spray Place 1 spray into both nostrils daily. 07/28/15   Okey Regal, PA-C  ibuprofen (ADVIL,MOTRIN) 400 MG tablet Take 1 tablet (400 mg total) by mouth every 6 (six) hours as needed (pain). 05/13/15   Leo Grosser, MD  levofloxacin (LEVAQUIN) 750 MG tablet Take 1 tablet (750 mg total) by mouth daily. 03/02/15   Ezequiel Essex, MD  loratadine-pseudoephedrine (CLARITIN-D 12 HOUR) 5-120 MG per tablet Take 1 tablet by mouth 2 (two) times daily. 05/13/15   Leo Grosser, MD   meloxicam (MOBIC) 15 MG tablet Take 1 tablet (15 mg total) by mouth daily. 04/06/15   Alvina Chou, PA-C  oxyCODONE-acetaminophen (PERCOCET/ROXICET) 5-325 MG per tablet Take 1-2 tablets by mouth every 4 (four) hours as needed for severe pain. 07/31/15   Waldemar Dickens, MD  SPIRIVA RESPIMAT 2.5 MCG/ACT AERS Inhale 2 sprays into the lungs daily. 11/24/15   Historical Provider, MD     Physical Exam: Filed Vitals:   11/28/15 2329 11/29/15 0000 11/29/15 0412 11/29/15 0431  BP:  114/75  93/65  Pulse:  82  88  Temp:  98.7 F (37.1 C)  98.6 F (37 C)  TempSrc:  Oral  Oral  Resp:  18  19  Height:  6' 1" (1.854 m)    Weight:  68.2 kg (150 lb 5.7 oz)    SpO2: 97% 100% 97% 98%     Constitutional: Vital signs reviewed. Patient is cachectic an ill-appearing, but nontoxic. Head: Normocephalic, atraumatic, with bitemporal wasting Ear: TM normal bilaterally  Mouth: no erythema or exudates, MMM  Eyes: PERRL, EOMI, conjunctivae normal, No scleral icterus.  Neck: Supple, Trachea midline normal ROM, No JVD, mass, thyromegaly, or carotid bruit present.  Cardiovascular: RRR, S1 normal, S2 normal, no MRG, pulses symmetric and intact bilaterally  Pulmonary/Chest: Patient with relatively clear breath sounds except for the occasional revealed a rale. No wheeze appreciated. Tenderness to palpation of the left rib cage along the eighth rib Abdominal: Soft. Non-tender, non-distended, bowel sounds are normal, no masses, organomegaly, or guarding present.  GU: no CVA tenderness Musculoskeletal: No joint deformities, erythema, or stiffness, ROM full and no nontender Ext: no edema and no cyanosis, pulses palpable bilaterally (DP and PT)  Hematology: no cervical, inginal, or axillary adenopathy.  Neurological: A&O x3, Strenght is normal and symmetric bilaterally, cranial nerve II-XII are grossly intact, no focal motor deficit, sensory intact to light touch bilaterally.  Skin: Warm, dry and intact. No rash,  cyanosis, or clubbing.  Psychiatric: Normal mood and affect. speech and behavior is normal. Judgment and thought content normal. Cognition and memory are normal.      Data Review   Micro Results No results found for this or any previous visit (from the past 240 hour(s)).  Radiology Reports Dg Ribs Unilateral W/chest Left  11/28/2015  CLINICAL DATA:  Smoker, fall, right rib pain EXAM: LEFT RIBS AND CHEST - 3+ VIEW COMPARISON:  CT scan 03/02/2015 FINDINGS: There is cavitary consolidation in right upper lobe although this may be due to cavitary pneumonia malignancy cannot be excluded. There is patchy reticulonodular airspace disease for right lower lobe and left upper lobe and lingula. Multifocal infection or lymphangitic tumor spread cannot be excluded. Further correlation with CT scan of the chest is recommended. Displaced fracture of the left eighth rib. There is cortical irregularity left ninth, tenth and eleventh rib. This may be due to fracture of indeterminate age. Clinical correlation is necessary.  No pneumothorax. Old right upper rib fractures. IMPRESSION: 1. Cavitary consolidation in right upper lobe may be due to cavitary pneumonia or malignancy. 2. Reticulonodular airspace disease right lower lobe lingula and left upper lobe may be due to multifocal infection or lymphangitic tumor spread. Mild displaced fracture of the left eighth rib. Cortical deformity of the left ninth 10 and 11 rib may be due to fracture of indeterminate age. Clinical correlation is necessary. No pneumothorax. Electronically Signed   By: Lahoma Crocker M.D.   On: 11/28/2015 17:27   Ct Chest W Contrast  11/28/2015  CLINICAL DATA:  Chest/rib pain status post fall. Recent pneumonia with abnormal radiographs concerning for cavitary pneumonia or malignancy. EXAM: CT CHEST WITH CONTRAST TECHNIQUE: Multidetector CT imaging of the chest was performed during intravenous contrast administration. CONTRAST:  82m OMNIPAQUE IOHEXOL 300  MG/ML  SOLN COMPARISON:  Chest radiographs 03/23/2014 and 11/28/2015. CT 03/02/2015. FINDINGS: Mediastinum/Nodes: There are possible mildly enlarged right hilar lymph nodes, likely reactive. No enlarged mediastinal or axillary lymph nodes are identified. The thyroid gland, trachea and esophagus demonstrate no significant findings. The heart size is normal. There is no pericardial effusion. There are no significant vascular findings. Lungs/Pleura: There is minimal pleural fluid on the right. There is volume loss in the right hemithorax with mediastinal shift to the right. As demonstrated on today's radiographs, there has been significant progression in the previously demonstrated irregular pulmonary nodules on chest CT. There are multiple cavitary masses in both lungs. The largest occupies the right apex and measures up to 7.0 x 5.9 cm transverse on image 13. There are multiple other cavitary masses, including 1 in the right lower lobe measuring 4.9 x 3.8 cm on image 45 and 1 in the left upper lobe measuring up to 2.9 cm on image 8. There are multiple other peribronchial opacities with a tree-in-bud distribution which have progressed compared with the prior study. There is central bronchiectasis, especially in the right upper lobe. Upper abdomen:  Unremarkable.  There is no adrenal mass. Musculoskeletal/Chest wall: Acute fracture of the left seventh rib laterally is noted on image 46. No evidence of pneumothorax or suspicious osseous finding. IMPRESSION: 1. Since the prior chest CT of 9 months ago, there has been significant progression of irregular nodular airspace disease in both lungs. There are multiple large cavitary masses, largest at the right apex with there is associated volume loss. These findings are worrisome for reactivation tuberculosis. Other less likely considerations include fungal infection and cavitary metastases. Respiratory isolation recommended. 2. Probable small reactive lymph nodes in the  right hilum. No significant pleural effusion. 3. Left seventh rib fracture without pneumothorax. 4. These results were called by telephone at the time of interpretation on 11/28/2015 at 8:30 pm to Dr. KSherian Maroon, who verbally acknowledged these results. Electronically Signed   By: WRichardean SaleM.D.   On: 11/28/2015 20:33     CBC  Recent Labs Lab 11/28/15 1852 11/29/15 0521  WBC 15.2* 12.4*  HGB 7.6*  9.5* 7.6*  HCT 25.1*  28.0* 24.4*  PLT 604* 594*  MCV 71.1* 71.3*  MCH 21.5* 22.2*  MCHC 30.3 31.1  RDW 19.6* 19.7*  LYMPHSABS 0.9  --   MONOABS 1.5*  --   EOSABS 0.3  --   BASOSABS 0.0  --     Chemistries   Recent Labs Lab 11/28/15 1852 11/29/15 0521  NA 130* 135  K 4.0 4.0  CL 96* 102  CO2  --  24  GLUCOSE  109* 116*  BUN 17 16  CREATININE 0.90 1.09  CALCIUM  --  9.4  AST  --  13*  ALT  --  14*  ALKPHOS  --  110  BILITOT  --  0.9   ------------------------------------------------------------------------------------------------------------------ estimated creatinine clearance is 73.9 mL/min (by C-G formula based on Cr of 1.09). ------------------------------------------------------------------------------------------------------------------ No results for input(s): HGBA1C in the last 72 hours. ------------------------------------------------------------------------------------------------------------------ No results for input(s): CHOL, HDL, LDLCALC, TRIG, CHOLHDL, LDLDIRECT in the last 72 hours. ------------------------------------------------------------------------------------------------------------------  Recent Labs  11/29/15 0010  TSH 3.844   ------------------------------------------------------------------------------------------------------------------ No results for input(s): VITAMINB12, FOLATE, FERRITIN, TIBC, IRON, RETICCTPCT in the last 72 hours.  Coagulation profile No results for input(s): INR, PROTIME in the last 168 hours.  No results  for input(s): DDIMER in the last 72 hours.  Cardiac Enzymes No results for input(s): CKMB, TROPONINI, MYOGLOBIN in the last 168 hours.  Invalid input(s): CK ------------------------------------------------------------------------------------------------------------------ Invalid input(s): POCBNP   CBG: No results for input(s): GLUCAP in the last 168 hours.     EKG: Independently reviewed.   Assessment/Plan  Cavitary lesion of lung: Patient with previous history of pneumonia in March reporting cough. CT of chest showing multiple cavitary pulmonary nodules. Patient afebrile in no acute distress vitals wise with complaints of weight loss 30 pounds over the last 6 months. Question of malignancy versus TB versus pneumonia. - consult pulmonology - esr/CRP - mucinex - Duonebs as needed for shortness of breath   Question of recurrent TB (pulmonary tuberculosis) : patient has history of TB 15 years ago that was reportedly treated. - Admit to MedSurg w/ Isolation precautions  - would need to try and get records from ?[place] department guarding treatment - started on TB treatment in ed  - AFB daily 2 ordered  EtOH dependence ([place])  reports drinking about a 40 ounce dearper day - CIW[facility] protocols  Left rib fracture secondary to recent fall -continue pain management  AKI creatinine baseline previously within normal limitsat 0.9 acutely elevated at 1.26 today - IV fluids -Recheck in am  Leukocytosis: of white blood cell count elevated at 15.2. Likely 2/2 to above.   Anemia initially noted to be 9.5 and 28 by i-STAT then rechecked with routine CBC showing hemoglobin of 7.6 - Question possibility of acute blood loss - continue to monitor   Code Status:   full Family Communication: bedside Disposition Plan: admit   Total time spent 55 minutes.Greater than 50% of this time was spent in counseling, explanation of diagnosis, planning of further  management, and coordination of care  [facility] Pager [phone]  If 7PM-7AM, please contact night-coverage [email or web address] Password [facility] 11/29/2015, 6:57 AM

## 2015-11-30 DIAGNOSIS — D509 Iron deficiency anemia, unspecified: Secondary | ICD-10-CM

## 2015-11-30 DIAGNOSIS — S2232XA Fracture of one rib, left side, initial encounter for closed fracture: Secondary | ICD-10-CM

## 2015-11-30 MED ORDER — MORPHINE SULFATE (PF) 2 MG/ML IV SOLN: 2.0000 mg | INTRAVENOUS | Status: DC | PRN

## 2015-11-30 MED ORDER — ISONIAZID 300 MG PO TABS
300.0000 mg | ORAL_TABLET | Freq: Every day | ORAL | Status: DC
  Administered 2015-11-30 – 2015-12-03 (×4): 300 mg via ORAL
  Filled 2015-11-30 (×4): qty 1

## 2015-11-30 MED ORDER — RIFAMPIN 300 MG PO CAPS
600.0000 mg | ORAL_CAPSULE | Freq: Every day | ORAL | Status: DC
  Administered 2015-11-30 – 2015-12-03 (×4): 600 mg via ORAL
  Filled 2015-11-30 (×4): qty 2

## 2015-11-30 MED ORDER — ZOLPIDEM TARTRATE 5 MG PO TABS
5.0000 mg | ORAL_TABLET | Freq: Every evening | ORAL | Status: DC | PRN
  Administered 2015-11-30 – 2015-12-02 (×3): 5 mg via ORAL
  Filled 2015-11-30 (×4): qty 1

## 2015-11-30 MED ORDER — PYRAZINAMIDE 500 MG PO TABS
1500.0000 mg | ORAL_TABLET | Freq: Every day | ORAL | Status: DC
  Administered 2015-11-30 – 2015-12-03 (×4): 1500 mg via ORAL
  Filled 2015-11-30 (×4): qty 3

## 2015-11-30 MED ORDER — DIPHENHYDRAMINE HCL 25 MG PO CAPS
25.0000 mg | ORAL_CAPSULE | Freq: Once | ORAL | Status: AC
Start: 2015-11-30 — End: 2015-12-02
  Administered 2015-12-02: 25 mg via ORAL
  Filled 2015-11-30: qty 1

## 2015-11-30 MED ORDER — HYDROCODONE-ACETAMINOPHEN 5-325 MG PO TABS
2.0000 | ORAL_TABLET | ORAL | Status: DC | PRN
  Administered 2015-11-30 – 2015-12-03 (×10): 2 via ORAL
  Filled 2015-11-30 (×10): qty 2

## 2015-11-30 NOTE — Progress Notes (Signed)
CRITICAL VALUE ALERT  Critical value received:  2 + AFB smears  Date of notification:  12/25  Time of notification:  1530  Critical value read back:Yes.    Nurse who received alert:  Carrie MewJasmine Juel Bellerose  MD notified (1st page):  Blake DivineAkula  Time of first page:  1530  Responding MD:  Blake DivineAkula  Time MD responded:  [phone]

## 2015-11-30 NOTE — Progress Notes (Signed)
Name: Frederick [name] MRN: [id number] DOB: 10-30-1961    ADMISSION DATE:  11/28/2015 CONSULTATION DATE:  11/29/15  REFERRING MD :  Akula/ [facility]  CHIEF COMPLAINT:  Left chest pain after fall  BRIEF PATIENT DESCRIPTION:  38 yoM smoker with a past medical history significant for TB, tobacco abuse, alcohol abuse, asthma/COPD; who presents with complaints of left-sided pain after falling at home while trying to get out of bed. Also describes signigicant weight loss, productive cough, at least one soaking night sweat over last few months. CT shows progressive cavitary nodular parenchymal disease. Treated TB 15 years ago in [place], [place].   SIGNIFICANT EVENTS    STUDIES:  CT chest 12/23   HISTORY OF PRESENT ILLNESS:  Treated in [place] about 15 years ago with pills (DOT?) and house confined x 6 months for TB. Long term 1/2 ppd smoker, ETOH, married living with wife. 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 Hosp 3/16 with SBO. Now admitted after he tripped and fell getting out of bed, sustaining L rib pain, leading to CXR/CT. Admits significant weight loss, persistent productive cough/ white-yellow, progressive DOE/ weakness, at least one soaking night sweat over last few months.  Comparison chest CTs reviewed by me show progression of nodular infiltrates now to severe parenchymal disease with areas of cavitation. He can't name his PCP. No respiratory cultures available prior to this admit. Evaluated for SBO 2014. Multiple ER visits.     SUBJECTIVE:  L lateral chest wall pain with cough. Taught to brace with pillow. Productive cough not changed. No blood.  VITAL SIGNS: Temp:  [97.5 F (36.4 C)-99.3 F (37.4 C)] 97.7 F (36.5 C) (12/25 0520) Pulse Rate:  [95-124] 95 (12/25 0520) Resp:  [18-20] 18 (12/25 0520) BP: (113-125)/(69-77) 119/77 mmHg (12/25 0520) SpO2:  [96 %-98 %] 97 % (12/25 0520) Weight:  [69.264 kg (152 lb 11.2 oz)] 69.264 kg (152 lb 11.2 oz) (12/24 2000)  PHYSICAL  EXAMINATION: General:  Alert, NAD, Oriented, appropriate,  Male, lying in bed,  Neuro:  nonfocal HEENT:  Teeth in good repair, mucosa clear,  Cardiovascular:  RRR, no mgr Lungs: Unlabored, few crackeles Abdomen: scaphoid w/o HSM Musculoskeletal:  Weak, required assistance to sit Skin:  No rash or bruising   Recent Labs Lab 11/28/15 1852 11/29/15 0521  NA 130* 135  K 4.0 4.0  CL 96* 102  CO2  --  24  BUN 17 16  CREATININE 0.90 1.09  GLUCOSE 109* 116*    Recent Labs Lab 11/28/15 1852 11/29/15 0521 11/29/15 0925  HGB 7.6*  9.5* 7.6* 7.6*  HCT 25.1*  28.0* 24.4* 25.0*  WBC 15.2* 12.4* 14.2*  PLT 604* 594* 582*   Dg Ribs Unilateral W/chest Left  11/28/2015  CLINICAL DATA:  Smoker, fall, right rib pain EXAM: LEFT RIBS AND CHEST - 3+ VIEW COMPARISON:  CT scan 03/02/2015 FINDINGS: There is cavitary consolidation in right upper lobe although this may be due to cavitary pneumonia malignancy cannot be excluded. There is patchy reticulonodular airspace disease for right lower lobe and left upper lobe and lingula. Multifocal infection or lymphangitic tumor spread cannot be excluded. Further correlation with CT scan of the chest is recommended. Displaced fracture of the left eighth rib. There is cortical irregularity left ninth, tenth and eleventh rib. This may be due to fracture of indeterminate age. Clinical correlation is necessary. No pneumothorax. Old right upper rib fractures. IMPRESSION: 1. Cavitary consolidation in right upper lobe may be due to cavitary pneumonia or malignancy. 2. Reticulonodular airspace disease  right lower lobe lingula and left upper lobe may be due to multifocal infection or lymphangitic tumor spread. Mild displaced fracture of the left eighth rib. Cortical deformity of the left ninth 10 and 11 rib may be due to fracture of indeterminate age. Clinical correlation is necessary. No pneumothorax. Electronically Signed   By: Natasha Mead M.D.   On: 11/28/2015 17:27    Ct Chest W Contrast  11/28/2015  CLINICAL DATA:  Chest/rib pain status post fall. Recent pneumonia with abnormal radiographs concerning for cavitary pneumonia or malignancy. EXAM: CT CHEST WITH CONTRAST TECHNIQUE: Multidetector CT imaging of the chest was performed during intravenous contrast administration. CONTRAST:  75mL OMNIPAQUE IOHEXOL 300 MG/ML  SOLN COMPARISON:  Chest radiographs 03/23/2014 and 11/28/2015. CT 03/02/2015. FINDINGS: Mediastinum/Nodes: There are possible mildly enlarged right hilar lymph nodes, likely reactive. No enlarged mediastinal or axillary lymph nodes are identified. The thyroid gland, trachea and esophagus demonstrate no significant findings. The heart size is normal. There is no pericardial effusion. There are no significant vascular findings. Lungs/Pleura: There is minimal pleural fluid on the right. There is volume loss in the right hemithorax with mediastinal shift to the right. As demonstrated on today's radiographs, there has been significant progression in the previously demonstrated irregular pulmonary nodules on chest CT. There are multiple cavitary masses in both lungs. The largest occupies the right apex and measures up to 7.0 x 5.9 cm transverse on image 13. There are multiple other cavitary masses, including 1 in the right lower lobe measuring 4.9 x 3.8 cm on image 45 and 1 in the left upper lobe measuring up to 2.9 cm on image 8. There are multiple other peribronchial opacities with a tree-in-bud distribution which have progressed compared with the prior study. There is central bronchiectasis, especially in the right upper lobe. Upper abdomen:  Unremarkable.  There is no adrenal mass. Musculoskeletal/Chest wall: Acute fracture of the left seventh rib laterally is noted on image 46. No evidence of pneumothorax or suspicious osseous finding. IMPRESSION: 1. Since the prior chest CT of 9 months ago, there has been significant progression of irregular nodular airspace  disease in both lungs. There are multiple large cavitary masses, largest at the right apex with there is associated volume loss. These findings are worrisome for reactivation tuberculosis. Other less likely considerations include fungal infection and cavitary metastases. Respiratory isolation recommended. 2. Probable small reactive lymph nodes in the right hilum. No significant pleural effusion. 3. Left seventh rib fracture without pneumothorax. 4. These results were called by telephone at the time of interpretation on 11/28/2015 at 8:30 pm to Dr. Rachelle Hora , who verbally acknowledged these results. Electronically Signed   By: Carey Bullocks M.D.   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 I agree with Dr Campbell/ ID that he is not acutely ill and we have time to confirm dx. I expect positive AFB smears on sputum, but if not readily forthcoming he should be bronched.  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  Aggressive progression since March, now with marked mediastinal shift to right, cavitary lesions in both lungs, extensive pleural deposits, and looser patchy areas that suggest inflammation.  Need to be sure repeat sputum cultures are all collected and sent  Active Problems:  History of tuberculosis- records from [facility] might be obtainable, but probably on  micro-fiche.  EtOH dependence (HCC)- risk for DTs compounded in isolation room  Left rib fracture  Microcytic anemia  Cigarette smoker- counseling begun  Protein-calorie malnutrition, severe (HCC)  Unintentional weight loss  Audree Bane D Young, MD Pulmonary and Critical Care Medicine [facility] [phone] After hours Pager: [phone]  11/30/2015, 8:09 AM

## 2015-11-30 NOTE — Progress Notes (Signed)
[facility] PROGRESS NOTE  Frederick [name] KYH:[id number] DOB: 18-Dec-1960 DOA: 11/28/2015 PCP: No PCP Per Patient Brief HIstory: Frederick [name] is a 54 year old male with a past medical history significant for TB, tobacco abuse, alcohol abuse, asthma/COPD; presents with left chest wall pain after a fall at home. He was found to have left sided rib fracture.  He also reports weight loss and productive cough from 6 months.  He reports finishing his TB treatment 15 year ago in [place].   Assessment/Plan: 54 year old gentleman withh/o TB admitted for left sided chest wall pain after a fall. He was found to have rib fracture on the left and abnormal CT chest suspicious for reactivation TB vs Malignancy. we have requested medical records from [facility] and pending those results, sent sputum for analysis for AFB and culture.  Meanwhile he is getting pain control for the rib fractures.  ID and pulm consulted and recommendations given.  2 Sputum specimen positive for 2+ acid fast bacilli. TB meds restarted.    COPD: No wheezing heard, resume bronchodilators as needed.    Rib fractures Pain control and incentive spirometry.   Anemia: Microcytic.  Get anemia panel and stool for occult blood.   Leukocytosis: ? Infectious etiology.  HIV negative. Elevated ESR.      Code Status: full code.  Family Communication: none at bedside Disposition Plan: pending further investigation   Consultants: ID Pulmonary.   Procedures:  none  Antibiotics:  none  HPI/Subjective: Reports sleep aid , needs better   Objective: Filed Vitals:   11/30/15 0520 11/30/15 1015  BP: 119/77 117/71  Pulse: 95 99  Temp: 97.7 F (36.5 C) 98.2 F (36.8 C)  Resp: 18 18    Intake/Output Summary (Last 24 hours) at 11/30/15 1528 Last data filed at 11/30/15 1427  Gross per 24 hour  Intake    924 ml  Output   1275 ml  Net   -351 ml   Filed Weights   11/28/15 1753 11/29/15 0000  11/29/15 2000  Weight: 68.04 kg (150 lb) 68.2 kg (150 lb 5.7 oz) 69.264 kg (152 lb 11.2 oz)    Exam:   General:  Alert afebrile comfortable.   Cardiovascular: s1s2  Respiratory: ctab  Abdomen: soft non tender non distended bowel sounds heard  Musculoskeletal: no pedal edema.   Data Reviewed: Basic Metabolic Panel:  Recent Labs Lab 11/28/15 1852 11/29/15 0521  NA 130* 135  K 4.0 4.0  CL 96* 102  CO2  --  24  GLUCOSE 109* 116*  BUN 17 16  CREATININE 0.90 1.09  CALCIUM  --  9.4   Liver Function Tests:  Recent Labs Lab 11/29/15 0521  AST 13*  ALT 14*  ALKPHOS 110  BILITOT 0.9  PROT 5.9*  ALBUMIN 1.7*   No results for input(s): LIPASE, AMYLASE in the last 168 hours. No results for input(s): AMMONIA in the last 168 hours. CBC:  Recent Labs Lab 11/28/15 1852 11/29/15 0521 11/29/15 0925  WBC 15.2* 12.4* 14.2*  NEUTROABS 12.5*  --  11.9*  HGB 7.6*  9.5* 7.6* 7.6*  HCT 25.1*  28.0* 24.4* 25.0*  MCV 71.1* 71.3* 72.0*  PLT 604* 594* 582*   Cardiac Enzymes: No results for input(s): CKTOTAL, CKMB, CKMBINDEX, TROPONINI in the last 168 hours. BNP (last 3 results) No results for input(s): BNP in the last 8760 hours.  ProBNP (last 3 results) No results for input(s): PROBNP in the last 8760 hours.  CBG: No results  for input(s): GLUCAP in the last 168 hours.  Recent Results (from the past 240 hour(s))  AFB culture with smear     Status: None (Preliminary result)   Collection Time: 11/28/15 11:15 PM  Result Value Ref Range Status   Specimen Description SPUTUM  Final   Special Requests Normal  Final   Acid Fast Smear   Final    2+ ACID FAST BACILLI SEEN CRITICAL RESULT CALLED TO, READ BACK BY AND VERIFIED WITH: JASMINE 12/25 AT 78 BY DUNNJ Performed at [facility]    Culture   Final    CULTURE WILL BE EXAMINED FOR 6 WEEKS BEFORE ISSUING A FINAL REPORT Performed at [facility]    Report Status PENDING  Incomplete  AFB culture with  smear     Status: None (Preliminary result)   Collection Time: 11/29/15  7:24 AM  Result Value Ref Range Status   Specimen Description SPUTUM  Final   Special Requests NONE  Final   Acid Fast Smear   Final    2+ ACID FAST BACILLI SEEN CRITICAL RESULT CALLED TO, READ BACK BY AND VERIFIED WITH: JASMINE 12/25 AT 30 BY DUNNJ Performed at [facility]    Culture   Final    CULTURE WILL BE EXAMINED FOR 6 WEEKS BEFORE ISSUING A FINAL REPORT Performed at [facility]    Report Status PENDING  Incomplete     Studies: Dg Ribs Unilateral W/chest Left  11/28/2015  CLINICAL DATA:  Smoker, fall, right rib pain EXAM: LEFT RIBS AND CHEST - 3+ VIEW COMPARISON:  CT scan 03/02/2015 FINDINGS: There is cavitary consolidation in right upper lobe although this may be due to cavitary pneumonia malignancy cannot be excluded. There is patchy reticulonodular airspace disease for right lower lobe and left upper lobe and lingula. Multifocal infection or lymphangitic tumor spread cannot be excluded. Further correlation with CT scan of the chest is recommended. Displaced fracture of the left eighth rib. There is cortical irregularity left ninth, tenth and eleventh rib. This may be due to fracture of indeterminate age. Clinical correlation is necessary. No pneumothorax. Old right upper rib fractures. IMPRESSION: 1. Cavitary consolidation in right upper lobe may be due to cavitary pneumonia or malignancy. 2. Reticulonodular airspace disease right lower lobe lingula and left upper lobe may be due to multifocal infection or lymphangitic tumor spread. Mild displaced fracture of the left eighth rib. Cortical deformity of the left ninth 10 and 11 rib may be due to fracture of indeterminate age. Clinical correlation is necessary. No pneumothorax. Electronically Signed   By: Lahoma Crocker M.D.   On: 11/28/2015 17:27   Ct Chest W Contrast  11/28/2015  CLINICAL DATA:  Chest/rib pain status post fall. Recent pneumonia  with abnormal radiographs concerning for cavitary pneumonia or malignancy. EXAM: CT CHEST WITH CONTRAST TECHNIQUE: Multidetector CT imaging of the chest was performed during intravenous contrast administration. CONTRAST:  5m OMNIPAQUE IOHEXOL 300 MG/ML  SOLN COMPARISON:  Chest radiographs 03/23/2014 and 11/28/2015. CT 03/02/2015. FINDINGS: Mediastinum/Nodes: There are possible mildly enlarged right hilar lymph nodes, likely reactive. No enlarged mediastinal or axillary lymph nodes are identified. The thyroid gland, trachea and esophagus demonstrate no significant findings. The heart size is normal. There is no pericardial effusion. There are no significant vascular findings. Lungs/Pleura: There is minimal pleural fluid on the right. There is volume loss in the right hemithorax with mediastinal shift to the right. As demonstrated on today's radiographs, there has been significant progression in  the previously demonstrated irregular pulmonary nodules on chest CT. There are multiple cavitary masses in both lungs. The largest occupies the right apex and measures up to 7.0 x 5.9 cm transverse on image 13. There are multiple other cavitary masses, including 1 in the right lower lobe measuring 4.9 x 3.8 cm on image 45 and 1 in the left upper lobe measuring up to 2.9 cm on image 8. There are multiple other peribronchial opacities with a tree-in-bud distribution which have progressed compared with the prior study. There is central bronchiectasis, especially in the right upper lobe. Upper abdomen:  Unremarkable.  There is no adrenal mass. Musculoskeletal/Chest wall: Acute fracture of the left seventh rib laterally is noted on image 46. No evidence of pneumothorax or suspicious osseous finding. IMPRESSION: 1. Since the prior chest CT of 9 months ago, there has been significant progression of irregular nodular airspace disease in both lungs. There are multiple large cavitary masses, largest at the right apex with there is  associated volume loss. These findings are worrisome for reactivation tuberculosis. Other less likely considerations include fungal infection and cavitary metastases. Respiratory isolation recommended. 2. Probable small reactive lymph nodes in the right hilum. No significant pleural effusion. 3. Left seventh rib fracture without pneumothorax. 4. These results were called by telephone at the time of interpretation on 11/28/2015 at 8:30 pm to Dr. Sherian Maroon , who verbally acknowledged these results. Electronically Signed   By: Richardean Sale M.D.   On: 11/28/2015 20:33    Scheduled Meds: . fluticasone  1 spray Each Nare Daily  . folic acid  1 mg Oral Daily  . guaiFENesin  600 mg Oral BID  . multivitamin with minerals  1 tablet Oral Daily  . vitamin B-6  100 mg Oral Daily  . thiamine  100 mg Oral Daily  . tiotropium  18 mcg Inhalation Daily   Continuous Infusions:   Principal Problem:   Cavitary lesion of lung Active Problems:   EtOH dependence ([place])   History of tuberculosis   Left rib fracture   Microcytic anemia   Cigarette smoker   Protein-calorie malnutrition, severe ([place])   Unintentional weight loss    Time spent: 25 min    [facility] Pager [phone]. If 7PM-7AM, please contact night-coverage at [email or web address], password [facility] 11/30/2015, 3:28 PM  LOS: 2 days

## 2015-11-30 NOTE — Progress Notes (Signed)
Patient ID: Frederick [name], male   DOB: [birth year], 54 y.o.   MRN: [id number]         [facility]    Date of Admission:  11/28/2015     Principal Problem:   Cavitary lesion of lung Active Problems:   History of tuberculosis   EtOH dependence (HCC)   Left rib fracture   Microcytic anemia   Cigarette smoker   Protein-calorie malnutrition, severe (HCC)   Unintentional weight loss   . fluticasone  1 spray Each Nare Daily  . folic acid  1 mg Oral Daily  . guaiFENesin  600 mg Oral BID  . multivitamin with minerals  1 tablet Oral Daily  . vitamin B-6  100 mg Oral Daily  . thiamine  100 mg Oral Daily  . tiotropium  18 mcg Inhalation Daily    SUBJECTIVE: He feels about the same. He still bothered by left-sided chest pain from his broken ribs. He is having trouble sleeping. He notes no change in his productive cough.  Review of Systems: Review of Systems  Constitutional: Positive for malaise/fatigue. Negative for fever, chills, weight loss and diaphoresis.  HENT: Negative for sore throat.   Respiratory: Positive for cough, sputum production and shortness of breath. Negative for hemoptysis.   Cardiovascular: Positive for chest pain.  Gastrointestinal: Negative for nausea, vomiting and diarrhea.  Genitourinary: Negative for dysuria and frequency.  Musculoskeletal: Positive for joint pain.  Skin: Negative for rash.    Past Medical History  Diagnosis Date  . Arthritis   . Asthma   . EtOH dependence (HCC)     drinks 2-3 40s a day  . Fibula fracture     Dr Dion Saucier 02-2011  . Hernia     surgery 01-17-13  . Tuberculosis     Social History  Substance Use Topics  . Smoking status: Current Every Day Smoker -- 1.00 packs/day    Types: Cigarettes  . Smokeless tobacco: None     Comment: 1 ppd  . Alcohol Use: 8.4 oz/week    14 Cans of beer per week     Comment: occasionally; generally drinks (2) 40s/day    Family History  Problem Relation Age of  Onset  . Diabetes Mother   . Diabetes Father   . Hypertension Father    No Known Allergies  OBJECTIVE: Filed Vitals:   11/29/15 1803 11/29/15 2000 11/30/15 0520 11/30/15 1015  BP: 123/73 125/71 119/77 117/71  Pulse: 116 124 95 99  Temp: 99.3 F (37.4 C) 98.7 F (37.1 C) 97.7 F (36.5 C) 98.2 F (36.8 C)  TempSrc: Oral Oral Oral Oral  Resp: Height:      Weight:  152 lb 11.2 oz (69.264 kg)    SpO2: 98% 97% 97% 99%   Body mass index is 20.15 kg/(m^2).  Physical Exam  Constitutional: He is oriented to person, place, and time.  He is alert and in no distress watching television.  HENT:  Mouth/Throat: No oropharyngeal exudate.  Eyes: Conjunctivae are normal.  Cardiovascular: Normal rate and regular rhythm.   No murmur heard. Pulmonary/Chest: He has rales. He exhibits tenderness.  Abdominal: Soft. He exhibits no mass. There is no tenderness.  Musculoskeletal: Normal range of motion.  Neurological: He is alert and oriented to person, place, and time.  Skin: No rash noted.  Psychiatric: Mood and affect normal.    Lab Results Lab Results  Component Value Date   WBC 14.2* 11/29/2015  HGB 7.6* 11/29/2015   HCT 25.0* 11/29/2015   MCV 72.0* 11/29/2015   PLT 582* 11/29/2015    Lab Results  Component Value Date   CREATININE 1.09 11/29/2015   BUN 16 11/29/2015   NA 135 11/29/2015   K 4.0 11/29/2015   CL 102 11/29/2015   CO2 24 11/29/2015    Lab Results  Component Value Date   ALT 14* 11/29/2015   AST 13* 11/29/2015   ALKPHOS 110 11/29/2015   BILITOT 0.9 11/29/2015     Microbiology: No results found for this or any previous visit (from the past 240 hour(s)).   ASSESSMENT: His sputum Gram and AFB stains are pending. I will continue observation off of antibiotics for now.  PLAN: 1. Observe off of antibiotics pending sputum stain and culture results  Cliffton AstersJohn Denaja Verhoeven, MD [facility] [phone]  pager   [phone] cell 11/30/2015, 11:29 AM

## 2015-12-01 LAB — BASIC METABOLIC PANEL
Anion gap: 7 (ref 5–15)
BUN: 8 mg/dL (ref 6–20)
CO2: 28 mmol/L (ref 22–32)
Calcium: 9.6 mg/dL (ref 8.9–10.3)
Chloride: 94 mmol/L — ABNORMAL LOW (ref 101–111)
Creatinine, Ser: 0.89 mg/dL (ref 0.61–1.24)
GFR calc Af Amer: >60 mL/min (ref >60)
GFR calc non Af Amer: >60 mL/min (ref >60)
Glucose, Bld: 102 mg/dL — ABNORMAL HIGH (ref 65–99)
Potassium: 4.5 mmol/L (ref 3.5–5.1)
Sodium: 129 mmol/L — ABNORMAL LOW (ref 135–145)

## 2015-12-01 LAB — RETICULOCYTES
RBC.: 3.65 MIL/uL — ABNORMAL LOW (ref 4.22–5.81)
RETIC COUNT ABSOLUTE: 51.1 10*3/uL (ref 19.0–186.0)
RETIC CT PCT: 1.4 % (ref 0.4–3.1)

## 2015-12-01 LAB — CBC
HCT: 25.8 % — ABNORMAL LOW (ref 39.0–52.0)
Hemoglobin: 8.2 g/dL — ABNORMAL LOW (ref 13.0–17.0)
MCH: 22.5 pg — ABNORMAL LOW (ref 26.0–34.0)
MCHC: 31.8 g/dL (ref 30.0–36.0)
MCV: 70.7 fL — ABNORMAL LOW (ref 78.0–100.0)
Platelets: 616 10*3/uL — ABNORMAL HIGH (ref 150–400)
RBC: 3.65 MIL/uL — ABNORMAL LOW (ref 4.22–5.81)
RDW: 19.4 % — ABNORMAL HIGH (ref 11.5–15.5)
WBC: 13.9 10*3/uL — ABNORMAL HIGH (ref 4.0–10.5)

## 2015-12-01 LAB — IRON AND TIBC
Iron: 11 ug/dL — ABNORMAL LOW (ref 45–182)
Saturation Ratios: 6 % — ABNORMAL LOW (ref 17.9–39.5)
TIBC: 181 ug/dL — ABNORMAL LOW (ref 250–450)
UIBC: 170 ug/dL

## 2015-12-01 LAB — VITAMIN B12: Vitamin B-12: 462 pg/mL (ref 180–914)

## 2015-12-01 LAB — FOLATE: Folate: 18.6 ng/mL (ref >5.9)

## 2015-12-01 LAB — FERRITIN: Ferritin: 343 ng/mL — ABNORMAL HIGH (ref 24–336)

## 2015-12-01 MED ORDER — ETHAMBUTOL HCL 400 MG PO TABS
1600.0000 mg | ORAL_TABLET | Freq: Every day | ORAL | Status: DC
  Administered 2015-12-01 – 2015-12-03 (×3): 1600 mg via ORAL
  Filled 2015-12-01 (×3): qty 4

## 2015-12-01 NOTE — Progress Notes (Signed)
[facility] PROGRESS NOTE  Frederick [name] GQQ:[id number] DOB: 01/06/1961 DOA: 11/28/2015 PCP: No PCP Per Patient Brief HIstory: Frederick [name] is a 54 year old male with a past medical history significant for TB, tobacco abuse, alcohol abuse, asthma/COPD; presents with left chest wall pain after a fall at home. He was found to have left sided rib fracture.  He also reports weight loss and productive cough from 6 months.  He reports finishing his TB treatment 15 year ago in [place].   Assessment/Plan: 54 year old gentleman withh/o TB admitted for left sided chest wall pain after a fall. He was found to have rib fracture on the left and abnormal CT chest suspicious for reactivation TB vs Malignancy. we have requested medical records from [facility] and pending those results, sent sputum for analysis for AFB and culture.  Meanwhile he is getting pain control for the rib fractures.  ID and pulm consulted and recommendations given.  2 Sputum specimen positive for 2+ acid fast bacilli. TB meds restarted.    COPD: No wheezing heard, resume bronchodilators as needed.    Rib fractures Pain control and incentive spirometry.   Anemia: Microcytic.  Get anemia panel show iron levels, adequate ferritin, but could be elevated from TB. and stool for occult blood pending. Iron supplementation.   Leukocytosis: ? Infectious etiology.  HIV negative. Elevated ESR.      Code Status: full code.  Family Communication: none at bedside Disposition Plan: pending pt evaluation.   Consultants: ID Pulmonary.   Procedures:  none  Antibiotics:  none  HPI/Subjective: Comfortable. No new complaints.   Objective: Filed Vitals:   12/01/15 0800 12/01/15 1725  BP: 120/81 119/74  Pulse: 102 97  Temp: 98.2 F (36.8 C) 98.1 F (36.7 C)  Resp: 18 17    Intake/Output Summary (Last 24 hours) at 12/01/15 1903 Last data filed at 12/01/15 1727  Gross per 24 hour  Intake    600 ml   Output    810 ml  Net   -210 ml   Filed Weights   11/29/15 0000 11/29/15 2000 11/30/15 2021  Weight: 68.2 kg (150 lb 5.7 oz) 69.264 kg (152 lb 11.2 oz) 67.2 kg (148 lb 2.4 oz)    Exam:   General:  Alert afebrile comfortable.   Cardiovascular: s1s2  Respiratory: ctab  Abdomen: soft non tender non distended bowel sounds heard  Musculoskeletal: no pedal edema.   Data Reviewed: Basic Metabolic Panel:  Recent Labs Lab 11/28/15 1852 11/29/15 0521 12/01/15 0646  NA 130* 135 129*  K 4.0 4.0 4.5  CL 96* 102 94*  CO2  --  24 28  GLUCOSE 109* 116* 102*  BUN '17 16 8  ' CREATININE 0.90 1.09 0.89  CALCIUM  --  9.4 9.6   Liver Function Tests:  Recent Labs Lab 11/29/15 0521  AST 13*  ALT 14*  ALKPHOS 110  BILITOT 0.9  PROT 5.9*  ALBUMIN 1.7*   No results for input(s): LIPASE, AMYLASE in the last 168 hours. No results for input(s): AMMONIA in the last 168 hours. CBC:  Recent Labs Lab 11/28/15 1852 11/29/15 0521 11/29/15 0925 12/01/15 0646  WBC 15.2* 12.4* 14.2* 13.9*  NEUTROABS 12.5*  --  11.9*  --   HGB 7.6*  9.5* 7.6* 7.6* 8.2*  HCT 25.1*  28.0* 24.4* 25.0* 25.8*  MCV 71.1* 71.3* 72.0* 70.7*  PLT 604* 594* 582* 616*   Cardiac Enzymes: No results for input(s): CKTOTAL, CKMB, CKMBINDEX, TROPONINI in the last  168 hours. BNP (last 3 results) No results for input(s): BNP in the last 8760 hours.  ProBNP (last 3 results) No results for input(s): PROBNP in the last 8760 hours.  CBG: No results for input(s): GLUCAP in the last 168 hours.  Recent Results (from the past 240 hour(s))  AFB culture with smear     Status: None (Preliminary result)   Collection Time: 11/28/15 11:15 PM  Result Value Ref Range Status   Specimen Description SPUTUM  Final   Special Requests Normal  Final   Acid Fast Smear   Final    2+ ACID FAST BACILLI SEEN CRITICAL RESULT CALLED TO, READ BACK BY AND VERIFIED WITH: Frederick [name] 12/25 AT 1515 BY DUNNJ Performed at [facility]    Culture   Final    CULTURE WILL BE EXAMINED FOR 6 WEEKS BEFORE ISSUING A FINAL REPORT Performed at [facility]    Report Status PENDING  Incomplete  Culture, respiratory (NON-Expectorated)     Status: None (Preliminary result)   Collection Time: 11/28/15 11:47 PM  Result Value Ref Range Status   Specimen Description SPUTUM  Final   Special Requests NONE  Final   Gram Stain   Final    NO WBC SEEN FEW SQUAMOUS EPITHELIAL CELLS PRESENT ABUNDANT GRAM POSITIVE COCCI IN CLUSTERS MODERATE GRAM NEGATIVE RODS Performed at [facility]    Culture   Final    NORMAL OROPHARYNGEAL FLORA Performed at [facility]    Report Status PENDING  Incomplete  AFB culture with smear     Status: None (Preliminary result)   Collection Time: 11/29/15  7:24 AM  Result Value Ref Range Status   Specimen Description SPUTUM  Final   Special Requests NONE  Final   Acid Fast Smear   Final    2+ ACID FAST BACILLI SEEN CRITICAL RESULT CALLED TO, READ BACK BY AND VERIFIED WITH: Frederick [name] 12/25 AT 38 BY DUNNJ Performed at [facility]    Culture   Final    CULTURE WILL BE EXAMINED FOR 6 WEEKS BEFORE ISSUING A FINAL REPORT Performed at [facility]    Report Status PENDING  Incomplete     Studies: No results found.  Scheduled Meds: . diphenhydrAMINE  25 mg Oral Once  . ethambutol  1,600 mg Oral Daily  . fluticasone  1 spray Each Nare Daily  . folic acid  1 mg Oral Daily  . guaiFENesin  600 mg Oral BID  . isoniazid  300 mg Oral Daily  . multivitamin with minerals  1 tablet Oral Daily  . pyrazinamide  1,500 mg Oral Daily  . vitamin B-6  100 mg Oral Daily  . rifampin  600 mg Oral Daily  . thiamine  100 mg Oral Daily  . tiotropium  18 mcg Inhalation Daily   Continuous Infusions:   Principal Problem:   Cavitary lesion of lung Active Problems:   EtOH dependence ([place])   History of tuberculosis   Left rib fracture   Microcytic anemia   Cigarette  smoker   Protein-calorie malnutrition, severe ([place])   Unintentional weight loss    Time spent: 25 min    [facility] Pager [phone]. If 7PM-7AM, please contact night-coverage at [email or web address], password [facility] 12/01/2015, 7:03 PM  LOS: 3 days

## 2015-12-01 NOTE — Care Management Note (Signed)
Case Management Note  Patient Details  Name: Frederick [name] MRN: [id number] Date of Birth: 1961/08/26  Subjective/Objective:                    Action/Plan: Patient was admitted with cavitary lesion of the lung.  Will follow for discharge needs.  Expected Discharge Date:                  Expected Discharge Plan:     In-House Referral:     Discharge planning Services     Post Acute Care Choice:    Choice offered to:     DME Arranged:    DME Agency:     HH Arranged:    HH Agency:     Status of Service:  In process, will continue to follow  Medicare Important Message Given:    Date Medicare IM Given:    Medicare IM give by:    Date Additional Medicare IM Given:    Additional Medicare Important Message give by:     If discussed at Long Length of Stay Meetings, dates discussed:    Additional CommentsAnda Kraft:  Frederick Stock C, RN 12/01/2015, 3:07 PM [phone]

## 2015-12-01 NOTE — Progress Notes (Signed)
PT Cancellation Note  Patient Details Name: Frederick [name] A Bockrath MRN: [id number] DOB: Jul 06, 1961   Cancelled Treatment:    Reason Eval/Treat Not Completed: Fatigue/lethargy limiting ability to participate.  Pt resting comfortably right now.  Did not want to work with PT.  Requested we come back in AM to complete his physical assessment.    Thanks,    Rollene Rotundaebecca B. Brant Peets, PT, DPT [phone]   12/01/2015, 4:23 PM

## 2015-12-01 NOTE — Progress Notes (Signed)
Patient ID: Frederick [name], male   DOB: 12-Mar-1961, 54 y.o.   MRN: [id number]         [facility]    Date of Admission:  11/28/2015     Principal Problem:   Cavitary lesion of lung Active Problems:   History of tuberculosis   EtOH dependence (HCC)   Left rib fracture   Microcytic anemia   Cigarette smoker   Protein-calorie malnutrition, severe (HCC)   Unintentional weight loss   . diphenhydrAMINE  25 mg Oral Once  . ethambutol  1,600 mg Oral Daily  . fluticasone  1 spray Each Nare Daily  . folic acid  1 mg Oral Daily  . guaiFENesin  600 mg Oral BID  . isoniazid  300 mg Oral Daily  . multivitamin with minerals  1 tablet Oral Daily  . pyrazinamide  1,500 mg Oral Daily  . vitamin B-6  100 mg Oral Daily  . rifampin  600 mg Oral Daily  . thiamine  100 mg Oral Daily  . tiotropium  18 mcg Inhalation Daily    SUBJECTIVE: He feels about the same. He still bothered by left-sided chest pain from his broken ribs. He notes no change in his productive cough. [place]  Review of Systems: Review of Systems  Constitutional: Positive for malaise/fatigue. Negative for fever, chills, weight loss and diaphoresis.  HENT: Negative for sore throat.   Respiratory: Positive for cough, sputum production and shortness of breath. Negative for hemoptysis.   Cardiovascular: Positive for chest pain.  Gastrointestinal: Negative for nausea, vomiting and diarrhea.  Genitourinary: Negative for dysuria and frequency.  Musculoskeletal: Positive for joint pain.  Skin: Negative for rash.    Past Medical History  Diagnosis Date  . Arthritis   . Asthma   . EtOH dependence (HCC)     drinks 2-3 40s a day  . Fibula fracture     Dr Frederick SaucierLandau 02-2011  . Hernia     surgery 01-17-13  . Tuberculosis     Social History  Substance Use Topics  . Smoking status: Current Every Day Smoker -- 1.00 packs/day    Types: Cigarettes    . Smokeless tobacco: None     Comment: 1 ppd  . Alcohol Use: 8.4 oz/week    14 Cans of beer per week     Comment: occasionally; generally drinks (2) 40s/day    Family History  Problem Relation Age of Onset  . Diabetes Mother   . Diabetes Father   . Hypertension Father    No Known Allergies  OBJECTIVE: Filed Vitals:   11/30/15 1640 11/30/15 2021 12/01/15 0507 12/01/15 0800  BP: 117/77 125/75 118/79 120/81  Pulse: 97 110 109 102  Temp: 98.2 F (36.8 C) 99.2 F (37.3 C) 98.4 F (36.9 C) 98.2 F (36.8 C)  TempSrc: Oral Oral Oral Oral  Resp: 17 18 17 18   Height:      Weight:  148 lb 2.4 oz (67.2 kg)    SpO2: 100% 96% 90% 95%   Body mass index is 19.55 kg/(m^2).  Physical Exam  Constitutional: He is oriented to person, place, and time.  He is sitting up in a chair bathing.  HENT:  Mouth/Throat: No oropharyngeal exudate.  Eyes: Conjunctivae are normal.  Cardiovascular: Normal rate and regular rhythm.   No murmur heard. Pulmonary/Chest: He has rales. He exhibits tenderness.  Abdominal: Soft. He exhibits no mass. There is no  tenderness.  Musculoskeletal: Normal range of motion.  Neurological: He is alert and oriented to person, place, and time.  Skin: No rash noted.  Psychiatric: Mood and affect normal.    Lab Results Lab Results  Component Value Date   WBC 13.9* 12/01/2015   HGB 8.2* 12/01/2015   HCT 25.8* 12/01/2015   MCV 70.7* 12/01/2015   PLT 616* 12/01/2015    Lab Results  Component Value Date   CREATININE 0.89 12/01/2015   BUN 8 12/01/2015   NA 129* 12/01/2015   K 4.5 12/01/2015   CL 94* 12/01/2015   CO2 28 12/01/2015    Lab Results  Component Value Date   ALT 14* 11/29/2015   AST 13* 11/29/2015   ALKPHOS 110 11/29/2015   BILITOT 0.9 11/29/2015     Microbiology: Recent Results (from the past 240 hour(s))  AFB culture with smear     Status: None (Preliminary result)   Collection Time: 11/28/15 11:15 PM  Result Value Ref Range Status    Specimen Description SPUTUM  Final   Special Requests Normal  Final   Acid Fast Smear   Final    2+ ACID FAST BACILLI SEEN CRITICAL RESULT CALLED TO, READ BACK BY AND VERIFIED WITH: JASMINE 12/25 AT 1515 BY DUNNJ Performed at [facility]    Culture   Final    CULTURE WILL BE EXAMINED FOR 6 WEEKS BEFORE ISSUING A FINAL REPORT Performed at [facility]    Report Status PENDING  Incomplete  Culture, respiratory (NON-Expectorated)     Status: None (Preliminary result)   Collection Time: 11/28/15 11:47 PM  Result Value Ref Range Status   Specimen Description SPUTUM  Final   Special Requests NONE  Final   Gram Stain   Final    NO WBC SEEN FEW SQUAMOUS EPITHELIAL CELLS PRESENT ABUNDANT GRAM POSITIVE COCCI IN CLUSTERS MODERATE GRAM NEGATIVE RODS Performed at [facility]    Culture   Final    NORMAL OROPHARYNGEAL FLORA Performed at [facility]    Report Status PENDING  Incomplete  AFB culture with smear     Status: None (Preliminary result)   Collection Time: 11/29/15  7:24 AM  Result Value Ref Range Status   Specimen Description SPUTUM  Final   Special Requests NONE  Final   Acid Fast Smear   Final    2+ ACID FAST BACILLI SEEN CRITICAL RESULT CALLED TO, READ BACK BY AND VERIFIED WITH: JASMINE 12/25 AT 1515 BY DUNNJ Performed at [facility]    Culture   Final    CULTURE WILL BE EXAMINED FOR 6 WEEKS BEFORE ISSUING A FINAL REPORT Performed at [facility]    Report Status PENDING  Incomplete     ASSESSMENT: His sputum AFB stain is 2+ positive. 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 Four drug therapy has been restarted and the [facility] TB nurse will be contacted tomorrow to take over directly observed therapy and follow-up of his cultures.  PLAN: 1. Continue isoniazid, rifampin, ethambutol, pyrazinamide and B6 2. I will sign off now but please call if we can be of further assistance while  he is here  Cliffton Asters, MD [facility] for Infectious Disease [facility] [phone] pager   [phone] cell 12/01/2015, 2:44 PM

## 2015-12-01 NOTE — Progress Notes (Signed)
Chart reviewed.  AFB smear positive.  Anti-TB therapy started.  PCCM will sign off.  Please call if additional help needed while pt is in hospital.  Coralyn HellingVineet Kaliana Albino, MD [facility] 12/01/2015, 12:21 PM Pager:  [phone] After 3pm call: [phone]

## 2015-12-02 DIAGNOSIS — S2232XD Fracture of one rib, left side, subsequent encounter for fracture with routine healing: Secondary | ICD-10-CM

## 2015-12-02 LAB — OSMOLALITY, URINE: Osmolality, Ur: 183 mOsm/kg — ABNORMAL LOW (ref 300–900)

## 2015-12-02 LAB — BASIC METABOLIC PANEL
Anion gap: 7 (ref 5–15)
BUN: 5 mg/dL — AB (ref 6–20)
CALCIUM: 9.4 mg/dL (ref 8.9–10.3)
CO2: 26 mmol/L (ref 22–32)
CREATININE: 0.69 mg/dL (ref 0.61–1.24)
Chloride: 95 mmol/L — ABNORMAL LOW (ref 101–111)
GFR calc Af Amer: >60 mL/min (ref >60)
GFR calc non Af Amer: >60 mL/min (ref >60)
GLUCOSE: 101 mg/dL — AB (ref 65–99)
Potassium: 3.9 mmol/L (ref 3.5–5.1)
Sodium: 128 mmol/L — ABNORMAL LOW (ref 135–145)

## 2015-12-02 LAB — CULTURE, RESPIRATORY W GRAM STAIN
Culture: NORMAL
Gram Stain: NONE SEEN

## 2015-12-02 LAB — TSH: TSH: 5.969 u[IU]/mL — ABNORMAL HIGH (ref 0.350–4.500)

## 2015-12-02 LAB — SODIUM, URINE, RANDOM: Sodium, Ur: 23 mmol/L

## 2015-12-02 LAB — OSMOLALITY: Osmolality: 264 mOsm/kg — ABNORMAL LOW (ref 275–295)

## 2015-12-02 MED ORDER — BENZONATATE 100 MG PO CAPS: 200.0000 mg | ORAL_CAPSULE | Freq: Three times a day (TID) | ORAL | Status: DC | PRN

## 2015-12-02 MED ORDER — THIAMINE HCL 100 MG PO TABS: 100.0000 mg | ORAL_TABLET | Freq: Every day | ORAL | Status: DC

## 2015-12-02 MED ORDER — ZOLPIDEM TARTRATE 5 MG PO TABS: 5.0000 mg | ORAL_TABLET | Freq: Every evening | ORAL | Status: DC | PRN

## 2015-12-02 MED ORDER — GUAIFENESIN-DM 100-10 MG/5ML PO SYRP: 5.0000 mL | ORAL_SOLUTION | ORAL | Status: DC | PRN

## 2015-12-02 MED ORDER — SODIUM CHLORIDE 0.9 % IV SOLN
INTRAVENOUS | Status: DC
  Administered 2015-12-02: 19:00:00 via INTRAVENOUS

## 2015-12-02 MED ORDER — PYRAZINAMIDE 500 MG PO TABS: 1500.0000 mg | ORAL_TABLET | Freq: Every day | ORAL | Status: DC

## 2015-12-02 MED ORDER — IPRATROPIUM-ALBUTEROL 0.5-2.5 (3) MG/3ML IN SOLN: 3.0000 mL | Freq: Four times a day (QID) | RESPIRATORY_TRACT | Status: DC | PRN

## 2015-12-02 MED ORDER — ENSURE ENLIVE PO LIQD
237.0000 mL | Freq: Two times a day (BID) | ORAL | Status: DC
  Administered 2015-12-03: 237 mL via ORAL

## 2015-12-02 MED ORDER — RIFAMPIN 300 MG PO CAPS: 600.0000 mg | ORAL_CAPSULE | Freq: Every day | ORAL | Status: DC

## 2015-12-02 MED ORDER — PYRIDOXINE HCL 100 MG PO TABS: 100.0000 mg | ORAL_TABLET | Freq: Every day | ORAL | Status: DC

## 2015-12-02 MED ORDER — ETHAMBUTOL HCL 400 MG PO TABS: 1600.0000 mg | ORAL_TABLET | Freq: Every day | ORAL | Status: DC

## 2015-12-02 MED ORDER — ADULT MULTIVITAMIN W/MINERALS CH: 1.0000 | ORAL_TABLET | Freq: Every day | ORAL | Status: DC

## 2015-12-02 MED ORDER — ZOLPIDEM TARTRATE 5 MG PO TABS
5.0000 mg | ORAL_TABLET | Freq: Once | ORAL | Status: AC
  Administered 2015-12-03: 5 mg via ORAL

## 2015-12-02 MED ORDER — FOLIC ACID 1 MG PO TABS: 1.0000 mg | ORAL_TABLET | Freq: Every day | ORAL | Status: DC

## 2015-12-02 MED ORDER — BENZONATATE 200 MG PO CAPS: 200.0000 mg | ORAL_CAPSULE | Freq: Three times a day (TID) | ORAL | Status: DC | PRN

## 2015-12-02 MED ORDER — ISONIAZID 300 MG PO TABS: 300.0000 mg | ORAL_TABLET | Freq: Every day | ORAL | Status: DC

## 2015-12-02 MED ORDER — HYDROCODONE-ACETAMINOPHEN 5-325 MG PO TABS: 2.0000 | ORAL_TABLET | ORAL | Status: DC | PRN

## 2015-12-02 MED ORDER — FERROUS SULFATE 325 (65 FE) MG PO TABS
325.0000 mg | ORAL_TABLET | Freq: Two times a day (BID) | ORAL | Status: DC
  Administered 2015-12-03: 325 mg via ORAL
  Filled 2015-12-02: qty 1

## 2015-12-02 NOTE — Evaluation (Signed)
Physical Therapy Evaluation Patient Details Name: Frederick [name] MRN: [id number] DOB: 03/22/1961 Today's Date: 12/02/2015   History of Present Illness  54 yo male with discovery of lung lesion suspected to be recurrence of TB, has 30# wgt loss over the last several months, L rib fracture and leukocytosis.    Clinical Impression  Pt was able to walk with HHA and when PT withdrew help needed to hold furniture.  Despite his assertion that he is fine, he is very unsafe to walk alone.  Will expect he has to have 24/7 help to go home, and otherwise cannot be safe there.    Follow Up Recommendations Home health PT;Supervision/Assistance - 24 hour    Equipment Recommendations  Other (comment) (will assess RW to see if needed)    Recommendations for Other Services Rehab consult     Precautions / Restrictions Precautions Precautions: Fall (telemetry) Precaution Comments: TB airborne precautions Restrictions Weight Bearing Restrictions: No      Mobility  Bed Mobility Overal bed mobility: Modified Independent                Transfers Overall transfer level: Modified independent               General transfer comment: initial standing is mildly unsteady but no LOB  Ambulation/Gait Ambulation/Gait assistance: Min guard;Min assist Ambulation Distance (Feet): 40 Feet Assistive device: 1 person hand held assist Gait Pattern/deviations: Step-through pattern;Wide base of support;Drifts right/left;Ataxic Gait velocity: reduced Gait velocity interpretation: Below normal speed for age/gender General Gait Details: has some instability on R ankle and if left to walk without help reaches for the furniture  Stairs            Wheelchair Mobility    Modified Rankin (Stroke Patients Only)       Balance Overall balance assessment: Needs assistance Sitting-balance support: Feet supported Sitting balance-Leahy Scale: Good   Postural control: Posterior lean Standing  balance support: Single extremity supported Standing balance-Leahy Scale: Fair Standing balance comment: less than fair dynamic balance                             Pertinent Vitals/Pain Pain Assessment: Faces Faces Pain Scale: Hurts little more Pain Location: L ribs Pain Descriptors / Indicators: Cramping;Aching Pain Intervention(s): Monitored during session;Repositioned    Home Living Family/patient expects to be discharged to:: Private residence Living Arrangements: Spouse/significant other Available Help at Discharge: Family;Friend(s);Available 24 hours/day Type of Home: Apartment Home Access: Stairs to enter Entrance Stairs-Rails: None Entrance Stairs-Number of Steps: 2 Home Layout: One level   Additional Comments: States he walks with no AD and drives, out in community with no AD.    Prior Function Level of Independence: Independent         Comments: Pt very unstable on R ankle, unrelated to weakness     Hand Dominance        Extremity/Trunk Assessment   Upper Extremity Assessment: Overall WFL for tasks assessed           Lower Extremity Assessment: Overall WFL for tasks assessed (strength WFL but R ankle demonstrates a proprioceptive chang)      Cervical / Trunk Assessment: Normal  Communication   Communication: No difficulties  Cognition Arousal/Alertness: Awake/alert Behavior During Therapy: WFL for tasks assessed/performed Overall Cognitive Status: Within Functional Limits for tasks assessed  General Comments General comments (skin integrity, edema, etc.): Pt is losing balance to R with R ankle shifts, but is able to control his static balance.   Has been able to walk in the community and apparently has accommodated the shifting for years.    Exercises        Assessment/Plan    PT Assessment Patient needs continued PT services  PT Diagnosis Generalized weakness   PT Problem List Decreased  strength;Decreased range of motion;Decreased activity tolerance;Decreased balance;Decreased mobility;Decreased coordination;Decreased knowledge of use of DME;Decreased safety awareness;Decreased knowledge of precautions;Cardiopulmonary status limiting activity;Decreased skin integrity;Pain  PT Treatment Interventions DME instruction;Functional mobility training;Stair training;Gait training;Therapeutic activities;Therapeutic exercise;Balance training;Neuromuscular re-education;Patient/family education   PT Goals (Current goals can be found in the Care Plan section) Acute Rehab PT Goals Patient Stated Goal: to get home, get feeling better PT Goal Formulation: With patient Time For Goal Achievement: 12/16/15 Potential to Achieve Goals: Good    Frequency Min 3X/week   Barriers to discharge Inaccessible home environment stairs to enter house    Co-evaluation               End of Session Equipment Utilized During Treatment: Gait belt Activity Tolerance: Patient tolerated treatment well;Other (comment) (O2 sats were stable) Patient left: in bed;with call bell/phone within reach Nurse Communication: Mobility status         Time: 1610-9604 PT Time Calculation (min) (ACUTE ONLY): 25 min   Charges:   PT Evaluation $Initial PT Evaluation Tier I: 1 Procedure PT Treatments $Gait Training: 8-22 mins   PT G CodesIvar Drape 2015/12/09, 12:47 PM   Samul Dada, PT MS Acute Rehab Dept. Number: [facility] [id number] and [facility] [phone]

## 2015-12-02 NOTE — Discharge Summary (Signed)
Physician Discharge Summary  Frederick [name] BZJ:[id number] DOB: [birth year] DOA: 11/28/2015  PCP: No PCP Per Patient  Admit date: 11/28/2015 Discharge date: 12/03/2015  Time spent: 30  minutes  Recommendations for Outpatient Follow-up:  1. Please follow up with health department as recommended.  2. Please follow up with PT/RN as ordered.  3. Please follow upwith PCP in 2 weeks, post hospitalization visit.    Discharge Diagnoses:  Principal Problem:   Cavitary lesion of lung Active Problems:   EtOH dependence ([place])   History of tuberculosis   Left rib fracture   Microcytic anemia   Cigarette smoker   Protein-calorie malnutrition, severe (HCC)   Unintentional weight loss   Discharge Condition: IMPROVED  Diet recommendation: REGULAR DIET.   Filed Weights   11/29/15 2000 11/30/15 2021 12/02/15 0610  Weight: 69.264 kg (152 lb 11.2 oz) 67.2 kg (148 lb 2.4 oz) 68.3 kg (150 lb 9.2 oz)    History of present illness:  Frederick [name] is a 54 year old male with a past medical history significant for TB, tobacco abuse, alcohol abuse, asthma/COPD; presents with left chest wall pain after a fall at home. He was found to have left sided rib fracture.  He also reports weight loss and productive cough from 6 months.  He reports finishing his TB treatment 15 year ago in [place].   Hospital Course:  54 year old gentleman withh/o TB admitted for left sided chest wall pain after a fall. He was found to have rib fracture on the left and abnormal CT chest suspicious for reactivation TB vs Malignancy. we have requested medical records from [facility] and pending those results, sent sputum for analysis for AFB and culture. Meanwhile he is getting pain control for the rib fractures.  ID and pulm consulted and recommendations given.  2 Sputum specimen positive for 2+ acid fast bacilli. TB meds restarted.  Health department was closed today so he could not be discharged, tomorrow as  soon as health department comes to talk to him, he can be discharged home with anti TB meds.    COPD: No wheezing heard, resume bronchodilators as needed.    Rib fractures Pain control and incentive spirometry.   Anemia: Microcytic.  Get anemia panel show iron levels, adequate ferritin, but could be elevated from TB. and stool for occult blood pending. Iron supplementation on discharge.   Hyponatremia: Unclear etiology. Start him on gentle hydration and getting TSH, serum osmolarity, urine osmo and urine sodium and am cortisol.  Follow up ont he results and re check his sodium level in am and if improving can go home with home PT.    Leukocytosis: ? Infectious etiology.  HIV negative. Elevated ESR.     Procedures:  NONE  Consultations:  Pulmonology  ID   Discharge Exam: Filed Vitals:   12/02/15 0610 12/02/15 1457  BP: 122/72 115/66  Pulse: 97 102  Temp: 98.4 F (36.9 C) 97.9 F (36.6 C)  Resp: 16 18    General: alert afebrile comfortable.  Cardiovascular: s1s2 Respiratory: ctab  Discharge Instructions   Discharge Instructions    Discharge instructions    Complete by:  As directed   Please follow upw ith Health Department as recommended.          Current Discharge Medication List    START taking these medications   Details  benzonatate (TESSALON) 200 MG capsule Take 1 capsule (200 mg total) by mouth 3 (three) times daily as needed for cough. Qty: 20  capsule, Refills: 0    ethambutol (MYAMBUTOL) 400 MG tablet Take 4 tablets (1,600 mg total) by mouth daily. Qty: 30 tablet, Refills: 3    folic acid (FOLVITE) 1 MG tablet Take 1 tablet (1 mg total) by mouth daily. Qty: 30 tablet, Refills: 1    guaiFENesin-dextromethorphan (ROBITUSSIN DM) 100-10 MG/5ML syrup Take 5 mLs by mouth every 4 (four) hours as needed for cough. Qty: 118 mL, Refills: 0    HYDROcodone-acetaminophen (NORCO/VICODIN) 5-325 MG tablet Take 2 tablets by mouth every 4 (four) hours  as needed for moderate pain. Qty: 15 tablet, Refills: 0    ipratropium-albuterol (DUONEB) 0.5-2.5 (3) MG/3ML SOLN Take 3 mLs by nebulization every 6 (six) hours as needed. Qty: 360 mL, Refills: 1    isoniazid (NYDRAZID) 300 MG tablet Take 1 tablet (300 mg total) by mouth daily. Qty: 30 tablet, Refills: 3    Multiple Vitamin (MULTIVITAMIN WITH MINERALS) TABS tablet Take 1 tablet by mouth daily. Qty: 30 tablet, Refills: 0    pyrazinamide 500 MG tablet Take 3 tablets (1,500 mg total) by mouth daily. Qty: 30 tablet, Refills: 3    pyridOXINE (B-6) 100 MG tablet Take 1 tablet (100 mg total) by mouth daily. Qty: 30 tablet, Refills: 3    rifampin (RIFADIN) 300 MG capsule Take 2 capsules (600 mg total) by mouth daily. Qty: 30 capsule, Refills: 3    thiamine 100 MG tablet Take 1 tablet (100 mg total) by mouth daily. Qty: 30 tablet, Refills: 1    zolpidem (AMBIEN) 5 MG tablet Take 1 tablet (5 mg total) by mouth at bedtime as needed for sleep. Qty: 10 tablet, Refills: 0      CONTINUE these medications which have NOT CHANGED   Details  fluticasone (FLONASE) 50 MCG/ACT nasal spray Place 1 spray into both nostrils daily. Qty: 16 g, Refills: 2    ibuprofen (ADVIL,MOTRIN) 400 MG tablet Take 1 tablet (400 mg total) by mouth every 6 (six) hours as needed (pain). Qty: 30 tablet, Refills: 0    loratadine-pseudoephedrine (CLARITIN-D 12 HOUR) 5-120 MG per tablet Take 1 tablet by mouth 2 (two) times daily. Qty: 30 tablet, Refills: 0    SPIRIVA RESPIMAT 2.5 MCG/ACT AERS Inhale 2 sprays into the lungs daily. Refills: 5      STOP taking these medications     cyclobenzaprine (FLEXERIL) 10 MG tablet      levofloxacin (LEVAQUIN) 750 MG tablet      meloxicam (MOBIC) 15 MG tablet      oxyCODONE-acetaminophen (PERCOCET/ROXICET) 5-325 MG per tablet        No Known Allergies    The results of significant diagnostics from this hospitalization (including imaging, microbiology, ancillary and  laboratory) are listed below for reference.    Significant Diagnostic Studies: Dg Ribs Unilateral W/chest Left  11/28/2015  CLINICAL DATA:  Smoker, fall, right rib pain EXAM: LEFT RIBS AND CHEST - 3+ VIEW COMPARISON:  CT scan 03/02/2015 FINDINGS: There is cavitary consolidation in right upper lobe although this may be due to cavitary pneumonia malignancy cannot be excluded. There is patchy reticulonodular airspace disease for right lower lobe and left upper lobe and lingula. Multifocal infection or lymphangitic tumor spread cannot be excluded. Further correlation with CT scan of the chest is recommended. Displaced fracture of the left eighth rib. There is cortical irregularity left ninth, tenth and eleventh rib. This may be due to fracture of indeterminate age. Clinical correlation is necessary. No pneumothorax. Old right upper rib fractures. IMPRESSION: 1.  Cavitary consolidation in right upper lobe may be due to cavitary pneumonia or malignancy. 2. Reticulonodular airspace disease right lower lobe lingula and left upper lobe may be due to multifocal infection or lymphangitic tumor spread. Mild displaced fracture of the left eighth rib. Cortical deformity of the left ninth 10 and 11 rib may be due to fracture of indeterminate age. Clinical correlation is necessary. No pneumothorax. Electronically Signed   By: Lahoma Crocker M.D.   On: 11/28/2015 17:27   Ct Chest W Contrast  11/28/2015  CLINICAL DATA:  Chest/rib pain status post fall. Recent pneumonia with abnormal radiographs concerning for cavitary pneumonia or malignancy. EXAM: CT CHEST WITH CONTRAST TECHNIQUE: Multidetector CT imaging of the chest was performed during intravenous contrast administration. CONTRAST:  54m OMNIPAQUE IOHEXOL 300 MG/ML  SOLN COMPARISON:  Chest radiographs 03/23/2014 and 11/28/2015. CT 03/02/2015. FINDINGS: Mediastinum/Nodes: There are possible mildly enlarged right hilar lymph nodes, likely reactive. No enlarged mediastinal or  axillary lymph nodes are identified. The thyroid gland, trachea and esophagus demonstrate no significant findings. The heart size is normal. There is no pericardial effusion. There are no significant vascular findings. Lungs/Pleura: There is minimal pleural fluid on the right. There is volume loss in the right hemithorax with mediastinal shift to the right. As demonstrated on today's radiographs, there has been significant progression in the previously demonstrated irregular pulmonary nodules on chest CT. There are multiple cavitary masses in both lungs. The largest occupies the right apex and measures up to 7.0 x 5.9 cm transverse on image 13. There are multiple other cavitary masses, including 1 in the right lower lobe measuring 4.9 x 3.8 cm on image 45 and 1 in the left upper lobe measuring up to 2.9 cm on image 8. There are multiple other peribronchial opacities with a tree-in-bud distribution which have progressed compared with the prior study. There is central bronchiectasis, especially in the right upper lobe. Upper abdomen:  Unremarkable.  There is no adrenal mass. Musculoskeletal/Chest wall: Acute fracture of the left seventh rib laterally is noted on image 46. No evidence of pneumothorax or suspicious osseous finding. IMPRESSION: 1. Since the prior chest CT of 9 months ago, there has been significant progression of irregular nodular airspace disease in both lungs. There are multiple large cavitary masses, largest at the right apex with there is associated volume loss. These findings are worrisome for reactivation tuberculosis. Other less likely considerations include fungal infection and cavitary metastases. Respiratory isolation recommended. 2. Probable small reactive lymph nodes in the right hilum. No significant pleural effusion. 3. Left seventh rib fracture without pneumothorax. 4. These results were called by telephone at the time of interpretation on 11/28/2015 at 8:30 pm to Dr. KSherian Maroon, who  verbally acknowledged these results. Electronically Signed   By: WRichardean SaleM.D.   On: 11/28/2015 20:33    Microbiology: Recent Results (from the past 240 hour(s))  AFB culture with smear     Status: None (Preliminary result)   Collection Time: 11/28/15 11:15 PM  Result Value Ref Range Status   Specimen Description SPUTUM  Final   Special Requests Normal  Final   Acid Fast Smear   Final    2+ ACID FAST BACILLI SEEN CRITICAL RESULT CALLED TO, READ BACK BY AND VERIFIED WITH: JASMINE 12/25 AT 157BY DUNNJ Performed at [facility]   Culture   Final    CULTURE WILL BE EXAMINED FOR 6 WEEKS BEFORE ISSUING A FINAL REPORT Performed at [facility]  Report Status PENDING  Incomplete  Culture, respiratory (NON-Expectorated)     Status: None   Collection Time: 11/28/15 11:47 PM  Result Value Ref Range Status   Specimen Description SPUTUM  Final   Special Requests NONE  Final   Gram Stain   Final    NO WBC SEEN FEW SQUAMOUS EPITHELIAL CELLS PRESENT ABUNDANT GRAM POSITIVE COCCI IN CLUSTERS MODERATE GRAM NEGATIVE RODS Performed at [facility]    Culture   Final    NORMAL OROPHARYNGEAL FLORA Performed at [facility]    Report Status 12/02/2015 FINAL  Final  AFB culture with smear     Status: None (Preliminary result)   Collection Time: 11/29/15  7:24 AM  Result Value Ref Range Status   Specimen Description SPUTUM  Final   Special Requests NONE  Final   Acid Fast Smear   Final    2+ ACID FAST BACILLI SEEN CRITICAL RESULT CALLED TO, READ BACK BY AND VERIFIED WITH: JASMINE 12/25 AT 1515 BY DUNNJ Performed at [facility]    Culture   Final    CULTURE WILL BE EXAMINED FOR 6 WEEKS BEFORE ISSUING A FINAL REPORT Performed at [facility]    Report Status PENDING  Incomplete     Labs: Basic Metabolic Panel:  Recent Labs Lab 11/28/15 1852 11/29/15 0521 12/01/15 0646 12/02/15 0630  NA 130* 135 129* 128*  K 4.0 4.0 4.5 3.9   CL 96* 102 94* 95*  CO2  --  _0 GLUCOSE 109* 116* 102* 101*  BUN _1 5*  CREATININE 0.90 1.09 0.89 0.69  CALCIUM  --  9.4 9.6 9.4   Liver Function Tests:  Recent Labs Lab 11/29/15 0521  AST 13*  ALT 14*  ALKPHOS 110  BILITOT 0.9  PROT 5.9*  ALBUMIN 1.7*   No results for input(s): LIPASE, AMYLASE in the last 168 hours. No results for input(s): AMMONIA in the last 168 hours. CBC:  Recent Labs Lab 11/28/15 1852 11/29/15 0521 11/29/15 0925 12/01/15 0646  WBC 15.2* 12.4* 14.2* 13.9*  NEUTROABS 12.5*  --  11.9*  --   HGB 7.6*  9.5* 7.6* 7.6* 8.2*  HCT 25.1*  28.0* 24.4* 25.0* 25.8*  MCV 71.1* 71.3* 72.0* 70.7*  PLT 604* 594* 582* 616*   Cardiac Enzymes: No results for input(s): CKTOTAL, CKMB, CKMBINDEX, TROPONINI in the last 168 hours. BNP: BNP (last 3 results) No results for input(s): BNP in the last 8760 hours.  ProBNP (last 3 results) No results for input(s): PROBNP in the last 8760 hours.  CBG: No results for input(s): GLUCAP in the last 168 hours.     SignedHosie Poisson MD  FACP  [facility] 12/02/2015, 6:11 PM

## 2015-12-02 NOTE — Progress Notes (Signed)
Patient requested order for Ensure between meals and bedtime. He also would like consult for social work. Will continue to monitor.

## 2015-12-02 NOTE — Care Management Important Message (Signed)
Important Message  Patient Details  Name: Frederick [name] MRN: [id number] Date of Birth: 24-May-1961   Medicare Important Message Given:  Yes    Corday Wyka, Annamarie Majorheryl U, RN 12/02/2015, 11:29 AM

## 2015-12-03 DIAGNOSIS — E871 Hypo-osmolality and hyponatremia: Secondary | ICD-10-CM

## 2015-12-03 LAB — BASIC METABOLIC PANEL
ANION GAP: 9 (ref 5–15)
BUN: 8 mg/dL (ref 6–20)
CHLORIDE: 94 mmol/L — AB (ref 101–111)
CO2: 27 mmol/L (ref 22–32)
Calcium: 9.5 mg/dL (ref 8.9–10.3)
Creatinine, Ser: 0.75 mg/dL (ref 0.61–1.24)
GFR calc non Af Amer: >60 mL/min (ref >60)
Glucose, Bld: 83 mg/dL (ref 65–99)
Potassium: 4.2 mmol/L (ref 3.5–5.1)
Sodium: 130 mmol/L — ABNORMAL LOW (ref 135–145)

## 2015-12-03 LAB — CORTISOL: Cortisol, Plasma: 9.7 ug/dL

## 2015-12-03 MED ORDER — ENSURE ENLIVE PO LIQD: 237.0000 mL | Freq: Two times a day (BID) | ORAL | Status: DC

## 2015-12-03 MED ORDER — FERROUS SULFATE 325 (65 FE) MG PO TABS: 325.0000 mg | ORAL_TABLET | Freq: Two times a day (BID) | ORAL | Status: DC

## 2015-12-03 NOTE — Care Management Note (Addendum)
Case Management Note  Patient Details  Name: Frederick [name] MRN: [id number] Date of Birth: [birth year]  Subjective/Objective:           CM following for progression and d/c planning.         Action/Plan: 12/03/2015 HH services arranged with [facility], [facility], and most likely by a Child psychotherapistocial worker. Will ask for HHPT and social worker at this time.  12/03/2015 Per [facility] this pt needs a PCP in order for them to provide services, as [facility] are [facility] at this time and the pt will be [facility] closely [facility] , Dr Malachi BondsShort has agreed to [facility].  Expected Discharge Date:    12/03/2015              Expected Discharge Plan:    In-House Referral:  NA  Discharge planning Services  CM Consult  Post Acute Care Choice:  NA Choice offered to:     DME Arranged:    DME Agency:     HH Arranged:   HH Agency:    Status of Service:  Completed, signed off  Medicare Important Message Given:  Yes Date Medicare IM Given:    Medicare IM give by:    Date Additional Medicare IM Given:    Additional Medicare Important Message give by:     If discussed at Long Length of Stay Meetings, dates discussed:    Additional Comments:  Starlyn SkeansRoyal, Zykeem Bauserman U, RN 12/03/2015, 12:43 PM

## 2015-12-03 NOTE — Progress Notes (Signed)
Per patient request, patient refused IV fluids. Patient educated on risks and he acknowledged.

## 2015-12-03 NOTE — Progress Notes (Addendum)
Spoke with Ludger Nuttingenee Carpenter [facility] concerning patient's discharge and follow up plans. Updated her on pt's address and phone number. Also updated patient on what to expect tomorrow.

## 2015-12-03 NOTE — Progress Notes (Addendum)
Patient seen and examined.  He continues to have a course cough with a wheeze.  He has left pleuritic chest pain which is about the same as previous days and he is concerned that since his arm was broken he cannot move his right arm very well.  He is anxious to go home.    GEN:  VS notable for mild tachycardia, otherwise  CV:  RRR, no mrg PULM:  rhonchorous bilateral BS, no wheezes or focal rales.   ABD:  NABS, soft, ND/NT MSK:  Limited ROM of the right shoulder, otherwise normal tone and bulk, no LEE  Sodium has trended up to 130 today.  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    Hyponatremia is improving.  Iron deficiency anemia and anemia of chronic disease.  Gave prescription for oral iron and will need follow up with PCP in 1 month.

## 2015-12-03 NOTE — Progress Notes (Signed)
Pt provided with discharge instructions including information on follow up appointments as well as new prescriptions. Pt verbalized understanding. Pt also instructed that [facility] will be contacting him to come by tomorrow, 12/29. Pt's IV was dc'd with no complication. VSS. Pt escorted out via wheelchair by NT

## 2015-12-17 ENCOUNTER — Telehealth: Payer: Self-pay | Admitting: *Deleted

## 2015-12-17 NOTE — Telephone Encounter (Signed)
[facility] notified positive for mycobacterium tuberculosis complex per Malachi BondsGloria at [facility] lab.  [phone]. Andree CossHowell, Michelle M, RN

## 2015-12-18 NOTE — ED Notes (Signed)
Record   reviewed  For   Staff  Contact   On this  visit

## 2016-01-05 ENCOUNTER — Encounter (HOSPITAL_COMMUNITY): Payer: Self-pay | Admitting: *Deleted

## 2016-01-05 ENCOUNTER — Emergency Department (HOSPITAL_COMMUNITY)
Admission: EM | Admit: 2016-01-05 | Discharge: 2016-01-05 | Disposition: A | Discharge status: Home / Self Care | Payer: Medicare Other | Attending: Emergency Medicine | Admitting: Emergency Medicine

## 2016-01-05 ENCOUNTER — Emergency Department (HOSPITAL_COMMUNITY): Payer: Medicare Other

## 2016-01-05 DIAGNOSIS — J45909 Unspecified asthma, uncomplicated: Secondary | ICD-10-CM | POA: Insufficient documentation

## 2016-01-05 DIAGNOSIS — Z7951 Long term (current) use of inhaled steroids: Secondary | ICD-10-CM | POA: Diagnosis not present

## 2016-01-05 DIAGNOSIS — R079 Chest pain, unspecified: Secondary | ICD-10-CM | POA: Diagnosis present

## 2016-01-05 DIAGNOSIS — Z79899 Other long term (current) drug therapy: Secondary | ICD-10-CM | POA: Insufficient documentation

## 2016-01-05 DIAGNOSIS — Z791 Long term (current) use of non-steroidal anti-inflammatories (NSAID): Secondary | ICD-10-CM | POA: Diagnosis not present

## 2016-01-05 DIAGNOSIS — Z8781 Personal history of (healed) traumatic fracture: Secondary | ICD-10-CM | POA: Insufficient documentation

## 2016-01-05 DIAGNOSIS — R0789 Other chest pain: Secondary | ICD-10-CM | POA: Diagnosis not present

## 2016-01-05 DIAGNOSIS — F17210 Nicotine dependence, cigarettes, uncomplicated: Secondary | ICD-10-CM | POA: Diagnosis not present

## 2016-01-05 DIAGNOSIS — Z8611 Personal history of tuberculosis: Secondary | ICD-10-CM | POA: Insufficient documentation

## 2016-01-05 LAB — I-STAT TROPONIN, ED: Troponin i, poc: 0.01 ng/mL (ref 0.00–0.08)

## 2016-01-05 LAB — CBC
HCT: 31.1 % — ABNORMAL LOW (ref 39.0–52.0)
Hemoglobin: 9.6 g/dL — ABNORMAL LOW (ref 13.0–17.0)
MCH: 23.4 pg — AB (ref 26.0–34.0)
MCHC: 30.9 g/dL (ref 30.0–36.0)
MCV: 75.7 fL — AB (ref 78.0–100.0)
PLATELETS: 564 10*3/uL — AB (ref 150–400)
RBC: 4.11 MIL/uL — AB (ref 4.22–5.81)
RDW: 21.6 % — ABNORMAL HIGH (ref 11.5–15.5)
WBC: 13.4 10*3/uL — ABNORMAL HIGH (ref 4.0–10.5)

## 2016-01-05 LAB — BASIC METABOLIC PANEL
Anion gap: 10 (ref 5–15)
BUN: 11 mg/dL (ref 6–20)
CHLORIDE: 104 mmol/L (ref 101–111)
CO2: 25 mmol/L (ref 22–32)
CREATININE: 0.8 mg/dL (ref 0.61–1.24)
Calcium: 9.5 mg/dL (ref 8.9–10.3)
GFR calc non Af Amer: >60 mL/min (ref >60)
GLUCOSE: 82 mg/dL (ref 65–99)
Potassium: 3.7 mmol/L (ref 3.5–5.1)
Sodium: 139 mmol/L (ref 135–145)

## 2016-01-05 MED ORDER — KETOROLAC TROMETHAMINE 60 MG/2ML IM SOLN
60.0000 mg | Freq: Once | INTRAMUSCULAR | Status: AC
  Administered 2016-01-05: 60 mg via INTRAMUSCULAR
  Filled 2016-01-05: qty 2

## 2016-01-05 MED ORDER — IBUPROFEN 800 MG PO TABS: 800.0000 mg | ORAL_TABLET | Freq: Three times a day (TID) | ORAL | Status: DC

## 2016-01-05 NOTE — ED Notes (Signed)
Pt verbalized understanding of d/c instructions, prescriptions, and follow-up care. No further questions/concerns, VSS, assisted to lobby in wheelchair.  

## 2016-01-05 NOTE — ED Provider Notes (Signed)
CSN: [id number]     Arrival date & time 01/05/16  1225 History   First MD Initiated Contact with Patient 01/05/16 1452     Chief Complaint  Patient presents with  . 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  He has had ongoing pain since that time in the L posterior lateral chest when he moves and breaths and coughs (dry and non productive and improving over time).  He has no rash there, no fevers and no swelling of the legs.  The pain is not present when he is laying perfectly still.  At this time the pain is moderate.  He describes it as a sharp and shooting pain that radiates to the anteiror chest on the L.  Past Medical History  Diagnosis Date  . Arthritis   . Asthma   . EtOH dependence (HCC)     drinks 2-3 40s a day  . Fibula fracture     Dr Dion Saucier 02-2011  . Hernia     surgery 01-17-13  . Tuberculosis    Past Surgical History  Procedure Laterality Date  . Ankle surgery    . Inguinal hernia repair Right 01/19/2013    Procedure: HERNIA REPAIR INGUINAL ADULT;  Surgeon: [name], MD,FACS;  Location: [facility] OR;  Service: General;  Laterality: Right;  . Tibia im nail insertion Left 01/22/2013    Procedure: INTRAMEDULLARY (IM) NAIL TIBIAL;  Surgeon: [name], MD;  Location: [facility] OR;  Service: Orthopedics;  Laterality: Left;   Family History  Problem Relation Age of Onset  . Diabetes Mother   . Diabetes Father   . Hypertension Father    Social History  Substance Use Topics  . Smoking status: Current Every Day Smoker -- 0.50 packs/day    Types:  Cigarettes  . Smokeless tobacco: None     Comment: 1 ppd  . Alcohol Use: 8.4 oz/week    14 Cans of beer per week     Comment: occasionally; generally drinks (2) 40s/day    Review of Systems  All other systems reviewed and are negative.     Allergies  Review of patient's allergies indicates no known allergies.  Home Medications   Prior to Admission medications   Medication Sig Start Date End Date Taking? Authorizing Provider  benzonatate (TESSALON) 200 MG capsule Take 1 capsule (200 mg total) by mouth 3 (three) times daily as needed for cough. 12/02/15  Yes Kathlen Mody, MD  diclofenac (VOLTAREN) 75 MG EC tablet Take 75 mg by mouth 2 (two) times daily. 12/30/15  Yes Historical Provider, MD  feeding supplement, ENSURE ENLIVE, (ENSURE ENLIVE) LIQD Take 237 mLs by mouth 2 (two) times daily between meals. 12/03/15  Yes Renae Fickle, MD  ferrous sulfate 325 (65 FE) MG tablet Take 1 tablet (325 mg total) by mouth 2 (two) times daily with a meal. 12/03/15  Yes Renae Fickle, MD  fluticasone (FLONASE) 50 MCG/ACT nasal spray Place 1 spray into both nostrils daily. 07/28/15  Yes Jeffrey Hedges, PA-C  guaiFENesin-dextromethorphan (ROBITUSSIN DM) 100-10  MG/5ML syrup Take 5 mLs by mouth every 4 (four) hours as needed for cough. 12/02/15  Yes Kathlen Mody, MD  ipratropium-albuterol (DUONEB) 0.5-2.5 (3) MG/3ML SOLN Take 3 mLs by nebulization every 6 (six) hours as needed. Patient taking differently: Take 3 mLs by nebulization every 6 (six) hours as needed (shortness of breath).  12/02/15  Yes Kathlen Mody, MD  loratadine-pseudoephedrine (CLARITIN-D 12 HOUR) 5-120 MG per tablet Take 1 tablet by mouth 2 (two) times daily. 05/13/15  Yes Lyndal Pulley, MD  Multiple Vitamin (MULTIVITAMIN WITH MINERALS) TABS tablet Take 1 tablet by mouth daily. 12/02/15  Yes Kathlen Mody, MD  pyridOXINE (B-6) 100 MG tablet Take 1 tablet (100 mg total) by mouth daily. 12/02/15  Yes Kathlen Mody, MD  SPIRIVA RESPIMAT 2.5  MCG/ACT AERS Inhale 2 sprays into the lungs daily. 11/24/15  Yes Historical Provider, MD  thiamine 100 MG tablet Take 1 tablet (100 mg total) by mouth daily. 12/02/15  Yes Kathlen Mody, MD  zolpidem (AMBIEN) 5 MG tablet Take 1 tablet (5 mg total) by mouth at bedtime as needed for sleep. 12/02/15  Yes Kathlen Mody, MD  ethambutol (MYAMBUTOL) 400 MG tablet Take 4 tablets (1,600 mg total) by mouth daily. 12/02/15   Kathlen Mody, MD  folic acid (FOLVITE) 1 MG tablet Take 1 tablet (1 mg total) by mouth daily. Patient not taking: Reported on 01/05/2016 12/02/15   Kathlen Mody, MD  HYDROcodone-acetaminophen (NORCO/VICODIN) 5-325 MG tablet Take 2 tablets by mouth every 4 (four) hours as needed for moderate pain. Patient not taking: Reported on 01/05/2016 12/02/15   Kathlen Mody, MD  ibuprofen (ADVIL,MOTRIN) 800 MG tablet Take 1 tablet (800 mg total) by mouth 3 (three) times daily. 01/05/16   Eber Hong, MD  isoniazid (NYDRAZID) 300 MG tablet Take 1 tablet (300 mg total) by mouth daily. 12/02/15   Kathlen Mody, MD  oxyCODONE-acetaminophen (PERCOCET/ROXICET) 5-325 MG tablet Take 1 tablet by mouth 2 (two) times daily as needed. 12/29/15   Historical Provider, MD  pyrazinamide 500 MG tablet Take 3 tablets (1,500 mg total) by mouth daily. 12/02/15   Kathlen Mody, MD  rifampin (RIFADIN) 300 MG capsule Take 2 capsules (600 mg total) by mouth daily. 12/02/15   Kathlen Mody, MD   BP 124/80 mmHg  Pulse 106  Temp(Src) 97.5 F (36.4 C) (Oral)  Resp 18  Ht  (1.854 m)  Wt 157 lb (71.215 kg)  BMI 20.72 kg/m2  SpO2 96% Physical Exam  Constitutional: He appears well-developed and well-nourished. No distress.  HENT:  Head: Normocephalic and atraumatic.  Mouth/Throat: Oropharynx is clear and moist. No oropharyngeal exudate.  Eyes: Conjunctivae and EOM are normal. Pupils are equal, round, and reactive to light. Right eye exhibits no discharge. Left eye exhibits no discharge. No scleral icterus.  Neck: Normal  range of motion. Neck supple. No JVD present. No thyromegaly present.  Cardiovascular: Normal rate, regular rhythm, normal heart sounds and intact distal pulses.  Exam reveals no gallop and no friction rub.   No murmur heard. Pulmonary/Chest: Effort normal and breath sounds normal. No respiratory distress. He has no wheezes. He has no rales. He exhibits tenderness ( ttp in the L posteriorlateral chest wall without rash or crepitance).  Abdominal: Soft. Bowel sounds are normal. He exhibits no distension and no mass. There is no tenderness.  Musculoskeletal: Normal range of motion. He exhibits no edema or tenderness.  Lymphadenopathy:    He has no cervical adenopathy.  Neurological: He is alert. Coordination normal.  Skin: Skin is warm and dry. No rash noted. No erythema.  Psychiatric: He has a normal mood and affect. His behavior is normal.  Nursing note and vitals reviewed.   ED Course  Procedures (including critical care time) Labs Review Labs Reviewed  CBC - Abnormal; Notable for the following:    WBC 13.4 (*)    RBC 4.11 (*)    Hemoglobin 9.6 (*)    HCT 31.1 (*)    MCV 75.7 (*)    MCH 23.4 (*)    RDW 21.6 (*)    Platelets 564 (*)    All other components within normal limits  BASIC METABOLIC PANEL  I-STAT TROPOININ, ED    Imaging Review Dg Chest 2 View  01/05/2016  CLINICAL DATA:  56 year old with personal history of tuberculosis, presenting with acute superimposed upon chronic chest pain, particularly want and supine. EXAM: CHEST  2 VIEW COMPARISON:  Chest x-ray and CT chest 11/28/2015 and earlier. FINDINGS: Conglomerate opacity in the right upper lobe associated with cavitation and smaller cavitary opacity in the right lower lobe, similar to the December, 2016 examination, causing tracheal deviation to the right. Scattered areas of linear and patchy opacity throughout both lungs, also unchanged. No new pulmonary parenchymal abnormalities. Cardiac silhouette normal in size,  unchanged. Multiple old healed bilateral rib fractures. IMPRESSION: 1. Stable conglomerate opacity in the right upper lobe associated with cavitation and stable patchy and streaky opacities throughout both lungs since December, 2016. Again, the differential diagnosis includes tuberculosis and fungal infection. 2. No new/acute cardiopulmonary disease. Electronically Signed   By: Hulan Saas M.D.   On: 01/05/2016 13:12   I have personally reviewed and evaluated these images and lab results as part of my medical decision-making.  ED ECG REPORT  I personally interpreted this EKG   Date: 01/05/2016   Rate: 95  Rhythm: normal sinus rhythm  QRS Axis: normal  Intervals: normal  ST/T Wave abnormalities: normal  Conduction Disutrbances:none  Narrative Interpretation: PACs seen  Old EKG Reviewed: none available   MDM   Final diagnoses:  Chest wall pain    The patient is well-appearing, he is not febrile, his oxygen is 96% on room air with a normal blood pressure and a borderline tachycardia. On my exam his pulse is 100 bpm. He has no other signs of worsening tuberculosis infection, his cough is gradually improving over time, his pain has been persistent since the fall with movement and is very localized over the site of his prior documented fracture. I see no indication for further evaluation other than what has already been done prior to my arrival with the nurse's ordering a chest x-ray, labs and an EKG, none of which show any new findings. He is well-appearing, he has a chronic leukocytosis, chronic anemia, chronic thrombocytosis. His chest x-ray is also unchanged. The patient has been informed that he will be treated with anti-inflammatories, nonnarcotic medications, he will be discharged home in a stable condition, he states he is taking his tuberculosis medication, he has taken the first of 6 months of therapy and is following closely with the health department. He has expressed his  understanding to the verbal instructions given.  Meds given in ED:  Medications  ketorolac (TORADOL) injection 60 mg (not administered)    New Prescriptions   IBUPROFEN (ADVIL,MOTRIN) 800 MG TABLET    Take 1 tablet (800 mg total) by mouth 3 (three) times daily.      Eber Hong, MD 01/05/16 [phone]

## 2016-01-05 NOTE — Discharge Instructions (Signed)

## 2016-01-05 NOTE — ED Notes (Signed)
Pt states L post chest pain that radiates to L anterior chest.  States he only experiences the pain when he lays down.  States fractured L sided ribs 2 weeks ago.  Actively being tx for Tuberculosis.

## 2016-01-12 LAB — AFB CULTURE WITH SMEAR (NOT AT ARMC)

## 2016-02-10 ENCOUNTER — Other Ambulatory Visit: Payer: Self-pay | Admitting: Infectious Disease

## 2016-02-10 ENCOUNTER — Ambulatory Visit
Admission: RE | Admit: 2016-02-10 | Discharge: 2016-02-10 | Disposition: A | Discharge status: Home / Self Care | Payer: No Typology Code available for payment source | Source: Ambulatory Visit | Attending: Infectious Disease | Admitting: Infectious Disease

## 2016-02-10 DIAGNOSIS — Z09 Encounter for follow-up examination after completed treatment for conditions other than malignant neoplasm: Secondary | ICD-10-CM

## 2016-02-10 LAB — AFB CULTURE WITH SMEAR (NOT AT ARMC): SPECIAL REQUESTS: NORMAL

## 2016-06-21 ENCOUNTER — Ambulatory Visit (HOSPITAL_COMMUNITY)
Admission: EM | Admit: 2016-06-21 | Discharge: 2016-06-21 | Disposition: A | Discharge status: Home / Self Care | Payer: Medicare Other | Attending: Family Medicine | Admitting: Family Medicine

## 2016-06-21 ENCOUNTER — Encounter (HOSPITAL_COMMUNITY): Payer: Self-pay | Admitting: Emergency Medicine

## 2016-06-21 DIAGNOSIS — Z79899 Other long term (current) drug therapy: Secondary | ICD-10-CM | POA: Insufficient documentation

## 2016-06-21 DIAGNOSIS — N4889 Other specified disorders of penis: Secondary | ICD-10-CM

## 2016-06-21 DIAGNOSIS — N489 Disorder of penis, unspecified: Secondary | ICD-10-CM

## 2016-06-21 DIAGNOSIS — F17210 Nicotine dependence, cigarettes, uncomplicated: Secondary | ICD-10-CM | POA: Insufficient documentation

## 2016-06-21 DIAGNOSIS — M1990 Unspecified osteoarthritis, unspecified site: Secondary | ICD-10-CM | POA: Diagnosis not present

## 2016-06-21 DIAGNOSIS — Z202 Contact with and (suspected) exposure to infections with a predominantly sexual mode of transmission: Secondary | ICD-10-CM | POA: Insufficient documentation

## 2016-06-21 DIAGNOSIS — L989 Disorder of the skin and subcutaneous tissue, unspecified: Secondary | ICD-10-CM | POA: Insufficient documentation

## 2016-06-21 DIAGNOSIS — J45909 Unspecified asthma, uncomplicated: Secondary | ICD-10-CM | POA: Diagnosis not present

## 2016-06-21 NOTE — ED Notes (Signed)
Patient reports sexual partner has been diagnoses with herpes.  Patient reports he has a scratch on penis.

## 2016-06-21 NOTE — ED Provider Notes (Signed)
CSN: [id number]     Arrival date & time 06/21/16  1306 History   First MD Initiated Contact with Patient 06/21/16 1534     Chief Complaint  Patient presents with  . Wound Check  . Exposure to STD   (Consider location/radiation/quality/duration/timing/severity/associated sxs/prior Treatment) Patient is a 55 y.o. male presenting with wound check and STD exposure. The history is provided by the patient.  Wound Check This is a new problem. Episode onset: pt ' partner with herpse, , pt with recurrent penile lesion. Pertinent negatives include no abdominal pain and no shortness of breath.  Exposure to STD Pertinent negatives include no abdominal pain and no shortness of breath.    Past Medical History  Diagnosis Date  . Arthritis   . Asthma   . EtOH dependence (HCC)     drinks 2-3 40s a day  . Fibula fracture     Dr Dion Saucier 02-2011  . Hernia     surgery 01-17-13  . Tuberculosis    Past Surgical History  Procedure Laterality Date  . Ankle surgery    . Inguinal hernia repair Right 01/19/2013    Procedure: HERNIA REPAIR INGUINAL ADULT;  Surgeon: [name], MD,FACS;  Location: [facility] OR;  Service: General;  Laterality: Right;  . Tibia im nail insertion Left 01/22/2013    Procedure: INTRAMEDULLARY (IM) NAIL TIBIAL;  Surgeon: [name], MD;  Location: [facility] OR;  Service: Orthopedics;  Laterality: Left;   Family History  Problem Relation Age of Onset  . Diabetes Mother   . Diabetes Father   . Hypertension Father    Social History  Substance Use Topics  . Smoking status: Current Every Day Smoker -- 0.50 packs/day    Types: Cigarettes  . Smokeless tobacco: None     Comment: 1 ppd  . Alcohol Use: 8.4 oz/week    14 Cans of beer per week     Comment: occasionally; generally drinks (2) 40s/day    Review of Systems  Constitutional: Negative.   Respiratory: Negative for shortness of breath.   Gastrointestinal: Negative for abdominal pain.  Genitourinary: Positive for penile pain.  Negative for discharge, penile swelling, scrotal swelling and testicular pain.  All other systems reviewed and are negative.   Allergies  Review of patient's allergies indicates no known allergies.  Home Medications   Prior to Admission medications   Medication Sig Start Date End Date Taking? Authorizing Provider  benzonatate (TESSALON) 200 MG capsule Take 1 capsule (200 mg total) by mouth 3 (three) times daily as needed for cough. 12/02/15   Kathlen Mody, MD  diclofenac (VOLTAREN) 75 MG EC tablet Take 75 mg by mouth 2 (two) times daily. 12/30/15   Historical Provider, MD  ethambutol (MYAMBUTOL) 400 MG tablet Take 4 tablets (1,600 mg total) by mouth daily. 12/02/15   Kathlen Mody, MD  feeding supplement, ENSURE ENLIVE, (ENSURE ENLIVE) LIQD Take 237 mLs by mouth 2 (two) times daily between meals. 12/03/15   Renae Fickle, MD  ferrous sulfate 325 (65 FE) MG tablet Take 1 tablet (325 mg total) by mouth 2 (two) times daily with a meal. 12/03/15   Renae Fickle, MD  fluticasone (FLONASE) 50 MCG/ACT nasal spray Place 1 spray into both nostrils daily. 07/28/15   Eyvonne Mechanic, PA-C  folic acid (FOLVITE) 1 MG tablet Take 1 tablet (1 mg total) by mouth daily. 12/02/15   Kathlen Mody, MD  guaiFENesin-dextromethorphan (ROBITUSSIN DM) 100-10 MG/5ML syrup Take 5 mLs by mouth every 4 (four) hours as  needed for cough. 12/02/15   Kathlen ModyVijaya Akula, MD  HYDROcodone-acetaminophen (NORCO/VICODIN) 5-325 MG tablet Take 2 tablets by mouth every 4 (four) hours as needed for moderate pain. Patient not taking: Reported on 01/05/2016 12/02/15   Kathlen ModyVijaya Akula, MD  ibuprofen (ADVIL,MOTRIN) 800 MG tablet Take 1 tablet (800 mg total) by mouth 3 (three) times daily. 01/05/16   Eber HongBrian Miller, MD  ipratropium-albuterol (DUONEB) 0.5-2.5 (3) MG/3ML SOLN Take 3 mLs by nebulization every 6 (six) hours as needed. Patient taking differently: Take 3 mLs by nebulization every 6 (six) hours as needed (shortness of breath).  12/02/15    Kathlen ModyVijaya Akula, MD  isoniazid (NYDRAZID) 300 MG tablet Take 1 tablet (300 mg total) by mouth daily. 12/02/15   Kathlen ModyVijaya Akula, MD  loratadine-pseudoephedrine (CLARITIN-D 12 HOUR) 5-120 MG per tablet Take 1 tablet by mouth 2 (two) times daily. 05/13/15   Lyndal Pulleyaniel Knott, MD  Multiple Vitamin (MULTIVITAMIN WITH MINERALS) TABS tablet Take 1 tablet by mouth daily. 12/02/15   Kathlen ModyVijaya Akula, MD  oxyCODONE-acetaminophen (PERCOCET/ROXICET) 5-325 MG tablet Take 1 tablet by mouth 2 (two) times daily as needed for moderate pain or severe pain.  12/29/15   Historical Provider, MD  pyrazinamide 500 MG tablet Take 3 tablets (1,500 mg total) by mouth daily. 12/02/15   Kathlen ModyVijaya Akula, MD  pyridOXINE (B-6) 100 MG tablet Take 1 tablet (100 mg total) by mouth daily. 12/02/15   Kathlen ModyVijaya Akula, MD  rifampin (RIFADIN) 300 MG capsule Take 2 capsules (600 mg total) by mouth daily. 12/02/15   Kathlen ModyVijaya Akula, MD  SPIRIVA RESPIMAT 2.5 MCG/ACT AERS Inhale 2 sprays into the lungs daily. 11/24/15   Historical Provider, MD  thiamine 100 MG tablet Take 1 tablet (100 mg total) by mouth daily. 12/02/15   Kathlen ModyVijaya Akula, MD  zolpidem (AMBIEN) 5 MG tablet Take 1 tablet (5 mg total) by mouth at bedtime as needed for sleep. 12/02/15   Kathlen ModyVijaya Akula, MD   Meds Ordered and Administered this Visit  Medications - No data to display  BP 152/89 mmHg  Pulse 82  Temp(Src) 98.8 F (37.1 C) (Oral)  Resp 12  SpO2 100% No data found.   Physical Exam  Constitutional: He is oriented to person, place, and time. He appears well-developed and well-nourished. No distress.  Genitourinary:  Crusting single lesion , nontender, no adenopathy.  Neurological: He is alert and oriented to person, place, and time.  Skin: Skin is warm and dry.  Nursing note and vitals reviewed.   ED Course  Procedures (including critical care time)  Labs Review Labs Reviewed  HSV 2 ANTIBODY, IGG  HSV 1 ANTIBODY, IGG    Imaging Review No results found.   Visual Acuity  Review  Right Eye Distance:   Left Eye Distance:   Bilateral Distance:    Right Eye Near:   Left Eye Near:    Bilateral Near:         MDM   1. Penile lesion        Linna HoffJames D Rhiannon Sassaman, MD 06/21/16 1616

## 2016-06-21 NOTE — Discharge Instructions (Signed)
We will call with test results and treat as needed. You may need to return if the blister reoccurs.

## 2016-06-22 LAB — HSV 1 ANTIBODY, IGG

## 2016-06-22 LAB — HSV 2 ANTIBODY, IGG: HSV 2 Glycoprotein G Ab, IgG: 15.6 index — ABNORMAL HIGH (ref 0.00–0.90)

## 2016-06-25 ENCOUNTER — Telehealth (HOSPITAL_COMMUNITY): Payer: Self-pay | Admitting: Internal Medicine

## 2016-06-25 NOTE — Telephone Encounter (Signed)
Clinical staff, please let patient know that test for herpes virus was positive and suggested previous infection.   Visit note documents painful penis lesion consistent with acute herpes. If painful penis lesion persists, would send prescription for famcyclovir 250mg  bid x 7d #14 no refills.  Recheck as needed.  LM

## 2016-06-29 ENCOUNTER — Ambulatory Visit
Admission: RE | Admit: 2016-06-29 | Discharge: 2016-06-29 | Disposition: A | Discharge status: Home / Self Care | Payer: No Typology Code available for payment source | Source: Ambulatory Visit | Attending: Infectious Disease | Admitting: Infectious Disease

## 2016-06-29 ENCOUNTER — Other Ambulatory Visit: Payer: Self-pay | Admitting: Infectious Disease

## 2016-06-29 DIAGNOSIS — Z09 Encounter for follow-up examination after completed treatment for conditions other than malignant neoplasm: Secondary | ICD-10-CM

## 2016-07-01 ENCOUNTER — Telehealth (HOSPITAL_COMMUNITY): Payer: Self-pay | Admitting: *Deleted

## 2016-08-20 ENCOUNTER — Other Ambulatory Visit: Payer: Self-pay | Admitting: Infectious Disease

## 2016-08-20 ENCOUNTER — Ambulatory Visit
Admission: RE | Admit: 2016-08-20 | Discharge: 2016-08-20 | Disposition: A | Discharge status: Home / Self Care | Payer: No Typology Code available for payment source | Source: Ambulatory Visit | Attending: Infectious Disease | Admitting: Infectious Disease

## 2016-08-20 DIAGNOSIS — A150 Tuberculosis of lung: Secondary | ICD-10-CM

## 2016-09-14 ENCOUNTER — Ambulatory Visit: Payer: Medicare Other | Admitting: Podiatry

## 2016-10-05 ENCOUNTER — Ambulatory Visit: Payer: Medicare Other | Admitting: Podiatry

## 2016-10-13 ENCOUNTER — Ambulatory Visit: Payer: Medicare Other | Admitting: Podiatry

## 2016-10-27 ENCOUNTER — Ambulatory Visit (INDEPENDENT_AMBULATORY_CARE_PROVIDER_SITE_OTHER): Payer: Medicare Other | Admitting: Podiatry

## 2016-10-27 NOTE — Progress Notes (Signed)
ERRONEOUS ENCOUNTER NO SHOW  

## 2016-11-24 ENCOUNTER — Ambulatory Visit (INDEPENDENT_AMBULATORY_CARE_PROVIDER_SITE_OTHER): Payer: Medicare Other | Admitting: Podiatry

## 2016-11-24 ENCOUNTER — Encounter: Payer: Self-pay | Admitting: Podiatry

## 2016-11-24 VITALS — BP 147/89 | HR 100 | Resp 18

## 2016-11-24 DIAGNOSIS — M2041 Other hammer toe(s) (acquired), right foot: Secondary | ICD-10-CM | POA: Diagnosis not present

## 2016-11-24 DIAGNOSIS — L84 Corns and callosities: Secondary | ICD-10-CM | POA: Diagnosis not present

## 2016-11-24 DIAGNOSIS — M79676 Pain in unspecified toe(s): Secondary | ICD-10-CM

## 2016-11-24 DIAGNOSIS — B351 Tinea unguium: Secondary | ICD-10-CM | POA: Diagnosis not present

## 2016-11-24 NOTE — Patient Instructions (Addendum)
Today I trim the soft corn in between the fourth and fifth toes and inserted a wedge. This thick skin or core most likely will recur and return as needed for trimming.  Corns and Calluses Introduction Corns are small areas of thickened skin that occur on the top, sides, or tip of a toe. They contain a cone-shaped core with a point that can press on a nerve below. This causes pain. Calluses are areas of thickened skin that can occur anywhere on the body including hands, fingers, palms, soles of the feet, and heels.Calluses are usually larger than corns. What are the causes? Corns and calluses are caused by rubbing (friction) or pressure, such as from shoes that are too tight or do not fit properly. What increases the risk? Corns are more likely to develop in people who have toe deformities, such as hammer toes. Since calluses can occur with friction to any area of the skin, calluses are more likely to develop in people who:  Work with their hands.  Wear shoes that fit poorly, shoes that are too tight, or shoes that are high-heeled.  Have toes deformities. What are the signs or symptoms? Symptoms of a corn or callus include:  A hard growth on the skin.  Pain or tenderness under the skin.  Redness and swelling.  Increased discomfort while wearing tight-fitting shoes. How is this diagnosed? Corns and calluses may be diagnosed with a medical history and physical exam. How is this treated? Corns and calluses may be treated with:  Removing the cause of the friction or pressure. This may include:  Changing your shoes.  Wearing shoe inserts (orthotics) or other protective layers in your shoes, such as a corn pad.  Wearing gloves.  Medicines to help soften skin in the hardened, thickened areas.  Reducing the size of the corn or callus by removing the dead layers of skin.  Antibiotic medicines to treat infection.  Surgery, if a toe deformity is the cause. Follow these instructions  at home:  Take medicines only as directed by your health care provider.  If you were prescribed an antibiotic, finish all of it even if you start to feel better.  Wear shoes that fit well. Avoid wearing high-heeled shoes and shoes that are too tight or too loose.  Wear any padding, protective layers, gloves, or orthotics as directed by your health care provider.  Soak your hands or feet and then use a file or pumice stone to soften your corn or callus. Do this as directed by your health care provider.  Check your corn or callus every day for signs of infection. Watch for:  Redness, swelling, or pain.  Fluid, blood, or pus. Contact a health care provider if:  Your symptoms do not improve with treatment.  You have increased redness, swelling, or pain at the site of your corn or callus.  You have fluid, blood, or pus coming from your corn or callus.  You have new symptoms. This information is not intended to replace advice given to you by your health care provider. Make sure you discuss any questions you have with your health care provider. Document Released: 08/28/2004 Document Revised: 06/11/2016 Document Reviewed: 11/18/2014  2017 [employer]

## 2016-11-24 NOTE — Progress Notes (Signed)
   Subjective:    Patient ID: Frederick [name], male    DOB: [birth year], 55 y.o.   MRN: [id number]  HPI    This patient presents today with 2 complaints. His primary concern is a painful skin lesion in the fourth right webspace present for approximately one month. Patient has attempted to trim this lesion himself, however he said he was not able to get any relief and cause further irritation to the area. He states that this lesion is uncomfortable causing a limp when he walks. Is also concerned with thick toenails with color changes that have become more noticeable over the last several months. He says that he is having difficulty trimming her toenails because of the thickness and describes some discomfort in the thickened toenails when walking wearing shoes. He has attempted to trim the toenails, however unable to do so  Review of Systems  All other systems reviewed and are negative.  Patient states he is disabled because of arthritic complaints He is a smoker for 20 years stating that he is tapering from 2 packs to less than one pack daily    Objective:   Physical Exam  Orientated 3  Vascular: No peripheral edema or calf tenderness bilaterally DP and PT pulses 2/4 bilaterally Capillary reflex immediate bilaterally  Neurological: Sensation to 10 g monofilament wire intact 5/5 bilaterally Vibratory sensation reactive bilaterally Ankle reflex equal and reactive bilaterally  Dermatological: No open skin lesions bilaterally Surgical scar medial right ankle Well-organized soft corn fourth right webspace Corn lateral fifth toes bilaterally The toenails are elongated, discolored, hypertrophic with subungual debris and tenderness to palpation 6-10  Musculoskeletal: HAV bilaterally Varus rotation fifth toes/hammertoes bilaterally Manual motor testing dorsi flexion, plantar flexion, inversion, eversion 5/5 bilaterally     Assessment & Plan:   Assessment: Satisfactory neurovascular  status Hammertoe fifth bilaterally Fourth right webspace soft corn Symptomatic onychomycoses 6-10  Plan: I reviewed the results exam with patient today and made aware that soft corns resulted from friction rub and tender to become chronic. I recommended initial debridement and separation the toes. The consensus treatment. Also, recommended debridement of toenails and he consents verbally The fourth right webspace corn was debrided. The webspace was painted with Castallini pain. A silicone toe separator was inserted between the fourth and fifth right toes and patient instructed continue wearing the separator Corns  fifth toes bilaterally debrided Mycotic toenails 6-10 are debrided mechanically and legs without any bleeding

## 2017-01-26 ENCOUNTER — Encounter (HOSPITAL_COMMUNITY): Payer: Self-pay | Admitting: Emergency Medicine

## 2017-01-26 ENCOUNTER — Ambulatory Visit (HOSPITAL_COMMUNITY)
Admission: EM | Admit: 2017-01-26 | Discharge: 2017-01-26 | Disposition: A | Discharge status: Home / Self Care | Payer: Medicare Other | Attending: Family Medicine | Admitting: Family Medicine

## 2017-01-26 DIAGNOSIS — R0789 Other chest pain: Secondary | ICD-10-CM

## 2017-01-26 DIAGNOSIS — M546 Pain in thoracic spine: Secondary | ICD-10-CM | POA: Diagnosis not present

## 2017-01-26 DIAGNOSIS — M170 Bilateral primary osteoarthritis of knee: Secondary | ICD-10-CM | POA: Diagnosis not present

## 2017-01-26 MED ORDER — DICLOFENAC POTASSIUM 50 MG PO TABS: 50.0000 mg | ORAL_TABLET | Freq: Three times a day (TID) | ORAL | 0 refills | Status: DC

## 2017-01-26 NOTE — Discharge Instructions (Signed)
You may take the anti-inflammatory medicine with food as directed for any pain and chest wall pain. Apply ice to the left part of your chest where there is soreness. Follow up with your primary care doctor for chronic knee pain as needed. Your EKG did not indicate any acute heart problems. Your lungs were clear.

## 2017-01-26 NOTE — ED Triage Notes (Signed)
Pt reports waking up Monday morning with some back pain.  Yesterday morning he states he had left sided chest pain and the back pain was gone.  He states the pain is sharp and has been constant since yesterday morning.  He denies any associated SOB, dizziness, numbness or tingling, vision changes, or headache.  He also reports pain in his knees bilaterally for several months.

## 2017-01-26 NOTE — ED Provider Notes (Signed)
No CSN: [id number]     Arrival date & time 01/26/17  1251 History   First MD Initiated Contact with Patient 01/26/17 1417     Chief Complaint  Patient presents with  . Chest Pain  . Knee Pain    bilateral   (Consider location/radiation/quality/duration/timing/severity/associated sxs/prior Treatment) Upper back between the shoulder blades. It was worse when he moved, rolled over in bed and lift things. Over the past couple days it is gotten much better to the point where today there is no back pain. He has no pain or tenderness. His only complaint now is that of chest pain and a well localized area in the left anterior chest that hurts when he coughs or presses on it. Overall he states he is feeling generally well. It no time did he had chest heaviness, tightness, fullness or pressure. No shortness of breath no diaphoresis, nausea or vomiting or syncope.  Patient also makes note of soreness to the niece that he has had for greater than 2 years. He has been to his doctor and told he had osteoarthritis and was treated with Percocet.  It is noted in his medical history he has a history of arthritis, asthma, tobacco addiction, alcohol dependence and tuberculosis.      Past Medical History:  Diagnosis Date  . Arthritis   . Asthma   . EtOH dependence (HCC)    drinks 2-3 40s a day  . Fibula fracture    Dr Dion Saucier 02-2011  . Hernia    surgery 01-17-13  . Tuberculosis    Past Surgical History:  Procedure Laterality Date  . ANKLE SURGERY    . INGUINAL HERNIA REPAIR Right 01/19/2013   Procedure: HERNIA REPAIR INGUINAL ADULT;  Surgeon: [name], MD,FACS;  Location: [facility] OR;  Service: General;  Laterality: Right;  . TIBIA IM NAIL INSERTION Left 01/22/2013   Procedure: INTRAMEDULLARY (IM) NAIL TIBIAL;  Surgeon: [name], MD;  Location: [facility] OR;  Service: Orthopedics;  Laterality: Left;   Family History  Problem Relation Age of Onset  . Diabetes Mother   . Diabetes Father   .  Hypertension Father    Social History  Substance Use Topics  . Smoking status: Current Every Day Smoker    Packs/day: 0.50    Types: Cigarettes  . Smokeless tobacco: Never Used     Comment: 1 ppd  . Alcohol use 8.4 oz/week    14 Cans of beer per week     Comment: occasionally; generally drinks (2) 40s/day    Review of Systems  Constitutional: Negative.   HENT: Negative.   Eyes: Negative.   Respiratory: Positive for cough.        He states his cough is occasional and a little loose.  Cardiovascular: Positive for chest pain. Negative for palpitations and leg swelling.  Gastrointestinal: Negative.   Genitourinary: Negative.   Skin: Negative.   All other systems reviewed and are negative.   Allergies  Patient has no known allergies.  Home Medications   Prior to Admission medications   Medication Sig Start Date End Date Taking? Authorizing Provider  benzonatate (TESSALON) 200 MG capsule Take 1 capsule (200 mg total) by mouth 3 (three) times daily as needed for cough. 12/02/15   Kathlen Mody, MD  diclofenac (CATAFLAM) 50 MG tablet Take 1 tablet (50 mg total) by mouth 3 (three) times daily. One tablet TID with food prn pain. 01/26/17   Hayden Rasmussen, NP  ethambutol (MYAMBUTOL) 400 MG tablet Take  4 tablets (1,600 mg total) by mouth daily. 12/02/15   Kathlen Mody, MD  feeding supplement, ENSURE ENLIVE, (ENSURE ENLIVE) LIQD Take 237 mLs by mouth 2 (two) times daily between meals. 12/03/15   Renae Fickle, MD  ferrous sulfate 325 (65 FE) MG tablet Take 1 tablet (325 mg total) by mouth 2 (two) times daily with a meal. 12/03/15   Renae Fickle, MD  fluticasone (FLONASE) 50 MCG/ACT nasal spray Place 1 spray into both nostrils daily. 07/28/15   Eyvonne Mechanic, PA-C  folic acid (FOLVITE) 1 MG tablet Take 1 tablet (1 mg total) by mouth daily. 12/02/15   Kathlen Mody, MD  guaiFENesin-dextromethorphan (ROBITUSSIN DM) 100-10 MG/5ML syrup Take 5 mLs by mouth every 4 (four) hours as needed for  cough. 12/02/15   Kathlen Mody, MD  ipratropium-albuterol (DUONEB) 0.5-2.5 (3) MG/3ML SOLN Take 3 mLs by nebulization every 6 (six) hours as needed. Patient taking differently: Take 3 mLs by nebulization every 6 (six) hours as needed (shortness of breath).  12/02/15   Kathlen Mody, MD  isoniazid (NYDRAZID) 300 MG tablet Take 1 tablet (300 mg total) by mouth daily. 12/02/15   Kathlen Mody, MD  loratadine-pseudoephedrine (CLARITIN-D 12 HOUR) 5-120 MG per tablet Take 1 tablet by mouth 2 (two) times daily. 05/13/15   Lyndal Pulley, MD  Multiple Vitamin (MULTIVITAMIN WITH MINERALS) TABS tablet Take 1 tablet by mouth daily. 12/02/15   Kathlen Mody, MD  pyrazinamide 500 MG tablet Take 3 tablets (1,500 mg total) by mouth daily. 12/02/15   Kathlen Mody, MD  pyridOXINE (B-6) 100 MG tablet Take 1 tablet (100 mg total) by mouth daily. 12/02/15   Kathlen Mody, MD  rifampin (RIFADIN) 300 MG capsule Take 2 capsules (600 mg total) by mouth daily. 12/02/15   Kathlen Mody, MD  SPIRIVA RESPIMAT 2.5 MCG/ACT AERS Inhale 2 sprays into the lungs daily. 11/24/15   Historical Provider, MD  thiamine 100 MG tablet Take 1 tablet (100 mg total) by mouth daily. 12/02/15   Kathlen Mody, MD  zolpidem (AMBIEN) 5 MG tablet Take 1 tablet (5 mg total) by mouth at bedtime as needed for sleep. 12/02/15   Kathlen Mody, MD   Meds Ordered and Administered this Visit  Medications - No data to display  BP 133/76 (BP Location: Right Arm)   Pulse 106   Temp 98.7 F (37.1 C) (Oral)   SpO2 99%  No data found.   Physical Exam  Constitutional: He is oriented to person, place, and time. He appears well-developed and well-nourished.  HENT:  Head: Normocephalic and atraumatic.  Oropharynx with minor erythema and scant clear PND.  Eyes: EOM are normal.  Neck: Neck supple.  Cardiovascular: Normal rate, regular rhythm, normal heart sounds and intact distal pulses.  Exam reveals no friction rub.   No murmur heard. Pulmonary/Chest: Effort  normal and breath sounds normal. No respiratory distress. He has no wheezes. He has no rales.  Abdominal: Soft. There is no tenderness.  Musculoskeletal: He exhibits no edema.  Neurological: He is alert and oriented to person, place, and time.  Skin: Skin is warm and dry.  Nursing note and vitals reviewed.   Urgent Care Course    ED ECG REPORT   Date: 01/26/2017  Rate: 89  Rhythm: normal sinus rhythm  QRS Axis: normal  Intervals: normal  ST/T Wave abnormalities: normal  Conduction Disutrbances:none  Narrative Interpretation:   Old EKG Reviewed: none available  I have personally reviewed the EKG tracing and agree with the computerized printout  as noted.   Procedures (including critical care time)  Labs Review Labs Reviewed - No data to display  Imaging Review No results found.   Visual Acuity Review  Right Eye Distance:   Left Eye Distance:   Bilateral Distance:    Right Eye Near:   Left Eye Near:    Bilateral Near:         MDM   1. Chest wall pain   2. Acute left-sided thoracic back pain   3. Primary osteoarthritis of both knees    You may take the anti-inflammatory medicine with food as directed for any pain and chest wall pain. Apply ice to the left part of your chest where there is soreness. Follow up with your primary care doctor for chronic knee pain as needed. Your EKG did not indicate any acute heart problems. Your lungs were clear. Meds ordered this encounter  Medications  . diclofenac (CATAFLAM) 50 MG tablet    Sig: Take 1 tablet (50 mg total) by mouth 3 (three) times daily. One tablet TID with food prn pain.    Dispense:  21 tablet    Refill:  0    Order Specific Question:   Supervising Provider    Answer:   Linna HoffKINDL, JAMES D [[id number]]        Hayden Rasmussenavid Kemper Heupel, NP 01/26/17 [phone]

## 2017-02-01 ENCOUNTER — Emergency Department (HOSPITAL_COMMUNITY)
Admission: EM | Admit: 2017-02-01 | Discharge: 2017-02-01 | Disposition: A | Discharge status: Home / Self Care | Payer: Medicare Other | Attending: Emergency Medicine | Admitting: Emergency Medicine

## 2017-02-01 ENCOUNTER — Encounter (HOSPITAL_COMMUNITY): Payer: Self-pay | Admitting: Emergency Medicine

## 2017-02-01 DIAGNOSIS — F1010 Alcohol abuse, uncomplicated: Secondary | ICD-10-CM

## 2017-02-01 DIAGNOSIS — J45909 Unspecified asthma, uncomplicated: Secondary | ICD-10-CM | POA: Insufficient documentation

## 2017-02-01 DIAGNOSIS — F17210 Nicotine dependence, cigarettes, uncomplicated: Secondary | ICD-10-CM | POA: Diagnosis not present

## 2017-02-01 MED ORDER — CHLORDIAZEPOXIDE HCL 25 MG PO CAPS: ORAL_CAPSULE | ORAL | 0 refills | Status: DC

## 2017-02-01 NOTE — ED Triage Notes (Signed)
Patient requesting detox from ETOH. patient drinks at least 3 40oz beers and liquor per day.  Patient last drink was this morning.

## 2017-02-01 NOTE — ED Provider Notes (Signed)
[facility]-EMERGENCY DEPT Provider Note   CSN: [id number] Arrival date & time: 02/01/17  1019  By signing my name below, I, Teofilo Pod, attest that this documentation has been prepared under the direction and in the presence of Madaleine Simmon, PA-C. Electronically Signed: Teofilo Pod, ED Scribe. 02/01/2017. 8:03 PM.    History   Chief Complaint Chief Complaint  Patient presents with  . detox    The history is provided by the patient. No language interpreter was used.   HPI Comments:  Frederick [name] is a 57 y.o. male with PMHx of asthma who presents to the Emergency Department, here requesting detox for EtOH. Pt states that he wants to go in for detox tomorrow, and is here today for information. He has not set up anything for detox and does not know anything about the process. Pt reports that he drinks three 40oz bottles of malt liquor, and sometimes mixes in hard liquor. He states that he has been drinking daily for 7-8 years, and his last EtOH consumption was today at 0400. He states that he has become increasingly forgetful over the past several months. Pt states that he has arthritis causing pain to his hips, which he states is the reason he drinks. He is a smoker and uses O2 at home as needed. Pt denies any recent injury/trauma and denies illicit drug use. Pt denies SI/HI, hallucinations. Denies tremors, SOB, CP, dizziness, neuro deficits, N/V, abdominal pain, or any other complaints.   PCP: [facility]    Past Medical History:  Diagnosis Date  . Arthritis   . Asthma   . EtOH dependence (HCC)    drinks 2-3 40s a day  . Fibula fracture    Dr Dion Saucier 02-2011  . Hernia    surgery 01-17-13  . Tuberculosis     Patient Active Problem List   Diagnosis Date Noted  . Hyponatremia 12/03/2015  . Left rib fracture 11/29/2015  . Microcytic anemia 11/29/2015  . Cigarette smoker 11/29/2015  . Protein-calorie malnutrition, severe (HCC) 11/29/2015  . Unintentional weight loss  11/29/2015  . History of tuberculosis 11/28/2015  . Cavitary lesion of lung 11/28/2015  . SBO (small bowel obstruction) 03/10/2013  . Abdominal pain, diffuse 03/10/2013  . Nausea and vomiting 03/10/2013  . Fracture of fibula, proximal 01/21/2013  . Closed fracture of left distal distal tibia 01/21/2013  . EtOH dependence (HCC)   . Fibula fracture   . Hernia     Past Surgical History:  Procedure Laterality Date  . ANKLE SURGERY    . INGUINAL HERNIA REPAIR Right 01/19/2013   Procedure: HERNIA REPAIR INGUINAL ADULT;  Surgeon: [name], MD,FACS;  Location: [facility] OR;  Service: General;  Laterality: Right;  . TIBIA IM NAIL INSERTION Left 01/22/2013   Procedure: INTRAMEDULLARY (IM) NAIL TIBIAL;  Surgeon: [name], MD;  Location: [facility] OR;  Service: Orthopedics;  Laterality: Left;       Home Medications    Prior to Admission medications   Medication Sig Start Date End Date Taking? Authorizing Provider  benzonatate (TESSALON) 200 MG capsule Take 1 capsule (200 mg total) by mouth 3 (three) times daily as needed for cough. 12/02/15   Kathlen Mody, MD  chlordiazePOXIDE (LIBRIUM) 25 MG capsule 50mg  PO TID x 1D, then 25-50mg  PO BID X 1D, then 25-50mg  PO QD X 1D 02/01/17   Barrett Goldie C Milli Woolridge, PA-C  diclofenac (CATAFLAM) 50 MG tablet Take 1 tablet (50 mg total) by mouth 3 (three) times  daily. One tablet TID with food prn pain. 01/26/17   Hayden Rasmussenavid Mabe, NP  ethambutol (MYAMBUTOL) 400 MG tablet Take 4 tablets (1,600 mg total) by mouth daily. 12/02/15   Kathlen ModyVijaya Akula, MD  feeding supplement, ENSURE ENLIVE, (ENSURE ENLIVE) LIQD Take 237 mLs by mouth 2 (two) times daily between meals. 12/03/15   Renae FickleMackenzie Short, MD  ferrous sulfate 325 (65 FE) MG tablet Take 1 tablet (325 mg total) by mouth 2 (two) times daily with a meal. 12/03/15   Renae FickleMackenzie Short, MD  fluticasone (FLONASE) 50 MCG/ACT nasal spray Place 1 spray into both nostrils daily. 07/28/15   Eyvonne MechanicJeffrey Hedges, PA-C  folic acid (FOLVITE) 1 MG tablet Take 1  tablet (1 mg total) by mouth daily. 12/02/15   Kathlen ModyVijaya Akula, MD  guaiFENesin-dextromethorphan (ROBITUSSIN DM) 100-10 MG/5ML syrup Take 5 mLs by mouth every 4 (four) hours as needed for cough. 12/02/15   Kathlen ModyVijaya Akula, MD  ipratropium-albuterol (DUONEB) 0.5-2.5 (3) MG/3ML SOLN Take 3 mLs by nebulization every 6 (six) hours as needed. Patient taking differently: Take 3 mLs by nebulization every 6 (six) hours as needed (shortness of breath).  12/02/15   Kathlen ModyVijaya Akula, MD  isoniazid (NYDRAZID) 300 MG tablet Take 1 tablet (300 mg total) by mouth daily. 12/02/15   Kathlen ModyVijaya Akula, MD  loratadine-pseudoephedrine (CLARITIN-D 12 HOUR) 5-120 MG per tablet Take 1 tablet by mouth 2 (two) times daily. 05/13/15   Lyndal Pulleyaniel Knott, MD  Multiple Vitamin (MULTIVITAMIN WITH MINERALS) TABS tablet Take 1 tablet by mouth daily. 12/02/15   Kathlen ModyVijaya Akula, MD  pyrazinamide 500 MG tablet Take 3 tablets (1,500 mg total) by mouth daily. 12/02/15   Kathlen ModyVijaya Akula, MD  pyridOXINE (B-6) 100 MG tablet Take 1 tablet (100 mg total) by mouth daily. 12/02/15   Kathlen ModyVijaya Akula, MD  rifampin (RIFADIN) 300 MG capsule Take 2 capsules (600 mg total) by mouth daily. 12/02/15   Kathlen ModyVijaya Akula, MD  SPIRIVA RESPIMAT 2.5 MCG/ACT AERS Inhale 2 sprays into the lungs daily. 11/24/15   Historical Provider, MD  thiamine 100 MG tablet Take 1 tablet (100 mg total) by mouth daily. 12/02/15   Kathlen ModyVijaya Akula, MD  zolpidem (AMBIEN) 5 MG tablet Take 1 tablet (5 mg total) by mouth at bedtime as needed for sleep. 12/02/15   Kathlen ModyVijaya Akula, MD    Family History Family History  Problem Relation Age of Onset  . Diabetes Mother   . Diabetes Father   . Hypertension Father     Social History Social History  Substance Use Topics  . Smoking status: Current Every Day Smoker    Packs/day: 0.50    Types: Cigarettes  . Smokeless tobacco: Never Used     Comment: 1 ppd  . Alcohol use 8.4 oz/week    14 Cans of beer per week     Comment: occasionally; generally drinks (2) 40s/day      Allergies   Patient has no known allergies.   Review of Systems Review of Systems  Constitutional: Negative for chills and fever.  Respiratory: Negative for shortness of breath.   Cardiovascular: Negative for chest pain.  Gastrointestinal: Negative for abdominal pain, nausea and vomiting.  Neurological: Negative for dizziness, weakness, light-headedness, numbness and headaches.  Psychiatric/Behavioral: Negative for hallucinations and suicidal ideas.       Alcohol abuse  All other systems reviewed and are negative.    Physical Exam Updated Vital Signs BP 164/91 (BP Location: Left Arm)   Pulse 67   Temp 97.6 F (36.4 C) (Oral)  Resp 16   SpO2 96%   Physical Exam  Constitutional: He appears well-developed and well-nourished. No distress.  HENT:  Head: Normocephalic and atraumatic.  Mouth/Throat: Oropharynx is clear and moist.  Eyes: Conjunctivae and EOM are normal. Pupils are equal, round, and reactive to light.  Pupils 2mm and equal bilaterally.  Neck: Normal range of motion. Neck supple.  Cardiovascular: Normal rate, regular rhythm, normal heart sounds and intact distal pulses.   Pulmonary/Chest: Effort normal and breath sounds normal. No respiratory distress.  Abdominal: Soft. There is no tenderness. There is no guarding.  Musculoskeletal: He exhibits no edema.  Normal motor function intact in all extremities and spine. No midline spinal tenderness.   Lymphadenopathy:    He has no cervical adenopathy.  Neurological: He is alert.  No sensory deficits. Strength 5/5 in all extremities. No gait disturbance. Coordination intact including heel to shin and finger to nose. Cranial nerves III-XII grossly intact. No facial droop.   Skin: Skin is warm and dry. He is not diaphoretic.  Psychiatric: He has a normal mood and affect. His behavior is normal.  Nursing note and vitals reviewed.    ED Treatments / Results  DIAGNOSTIC STUDIES:  Oxygen Saturation is 96% on RA,  normal by my interpretation.    COORDINATION OF CARE:  8:03 PM Pt will undergo detox and consult with social worker. Discussed treatment plan with pt at bedside and pt agreed to plan.   Labs (all labs ordered are listed, but only abnormal results are displayed) Labs Reviewed - No data to display  EKG  EKG Interpretation None       Radiology No results found.  Procedures Procedures (including critical care time)  Medications Ordered in ED Medications - No data to display   Initial Impression / Assessment and Plan / ED Course  I have reviewed the triage vital signs and the nursing notes.  Pertinent labs & imaging results that were available during my care of the patient were reviewed by me and considered in my medical decision making (see chart for details).     Patient presents for information regarding detox from alcohol abuse. He has no other current complaints. He is not present with clinical signs of acute intoxication or withdrawal. TTS came to speak with the patient to give details regarding the process as well as any additional necessary resources. Next steps as well as return precautions were discussed. Patient voices understanding of all instructions and is comfortable with discharge.    Vitals:   02/01/17 1026 02/01/17 1156  BP: 164/91 159/92  Pulse: 67 92  Resp: 16 17  Temp: 97.6 F (36.4 C)   TempSrc: Oral   SpO2: 96% 97%     Final Clinical Impressions(s) / ED Diagnoses   Final diagnoses:  Alcohol abuse    New Prescriptions Discharge Medication List as of 02/01/2017 11:42 AM    START taking these medications   Details  chlordiazePOXIDE (LIBRIUM) 25 MG capsule 50mg  PO TID x 1D, then 25-50mg  PO BID X 1D, then 25-50mg  PO QD X 1D, Print      I personally performed the services described in this documentation, which was scribed in my presence. The recorded information has been reviewed and is accurate.    Anselm Pancoast, PA-C 02/01/17 [address] [place], [place] 02/02/17 [phone]

## 2017-02-01 NOTE — Discharge Instructions (Signed)
You have been seen today requesting assistance with alcohol abuse. This is typically an outpatient process.   1. Resources: Please refer to the list of resources available. Contact these sites to inquire about treatment.  2. Librium: Librium is medication that is used to help mitigate symptoms of withdrawal. Begin taking the taper on the first day of alcohol cessation or significant reduced use. 3. Withdrawal: Refer to the attached information regarding alcohol withdrawal. Return to ED should you begin to exhibit significant withdrawal symptoms.

## 2017-02-01 NOTE — [facility] Counselor (Signed)
Pt came to ED with his wife asking about information for detox services. Contacted EDP about pt and he stated that pt is not SI, HI or AVH and does not have psychiatric complaints so no formal assessment needs to be done.   Counselor went over list of resources with pt and wife based on insurance and told them options in the area. Pt denied SI, HI or AVH with Clinical research associatewriter and states that he just would like detox but does not want to go until tomorrow. Wife and pt thanked counselor and stated that they will explore these options when they leave the ED. No other complaints today. Pt looked steady and did not complain of pain or severe withdrawal symptoms. He states that his last drink was this morning.   [address], [address]

## 2017-02-01 NOTE — ED Notes (Signed)
Waiting for social work to come see.

## 2017-02-01 NOTE — ED Notes (Signed)
Bed: [facility] Expected date:  Expected time:  Means of arrival:  Comments: 

## 2017-03-16 ENCOUNTER — Encounter: Payer: Self-pay | Admitting: Podiatry

## 2017-03-16 ENCOUNTER — Ambulatory Visit (INDEPENDENT_AMBULATORY_CARE_PROVIDER_SITE_OTHER): Payer: Medicare Other | Admitting: Podiatry

## 2017-03-16 DIAGNOSIS — L84 Corns and callosities: Secondary | ICD-10-CM | POA: Diagnosis not present

## 2017-03-16 NOTE — Progress Notes (Signed)
Patient ID: Frederick [name], male   DOB: 25-Jun-1961, 56 y.o.   MRN: [id number]   Subjective: This patient presents today complaining of a painful fourth right webspace corn. This corn was debrided on the visit of 08/25/2016 and today patient's request debridement    Physical Exam  Orientated 3  Vascular: No peripheral edema or calf tenderness bilaterally DP and PT pulses 2/4 bilaterally Capillary reflex immediate bilaterally  Neurological: Sensation to 10 g monofilament wire intact 5/5 bilaterally Vibratory sensation reactive bilaterally Ankle reflex equal and reactive bilaterally  Dermatological: No open skin lesions bilaterally Surgical scar medial right ankle Well-organized soft corn fourth right webspace Corn lateral fifth toes bilaterally The toenails are elongated, discolored, hypertrophic with subungual debris and tenderness to palpation 6-10  Musculoskeletal: HAV bilaterally Varus rotation fifth toes/hammertoes bilaterally Manual motor testing dorsi flexion, plantar flexion, inversion, eversion 5/5 bilaterally   Assessment: Satisfactory neurovascular status Hammertoe fifth bilaterally Fourth right webspace soft corn  Plan: Debride fourth right webspace corn without any bleeding. Discuss treatment options including repetitive debridement or surgical webbing of the fourth right webspace. Patient states that he has no interest in surgical treatment and requests to return as needed  Reappoint at patient's request

## 2017-06-22 ENCOUNTER — Ambulatory Visit (INDEPENDENT_AMBULATORY_CARE_PROVIDER_SITE_OTHER): Payer: Medicare Other | Admitting: Orthopaedic Surgery

## 2017-06-22 ENCOUNTER — Encounter (INDEPENDENT_AMBULATORY_CARE_PROVIDER_SITE_OTHER): Payer: Self-pay

## 2017-06-22 ENCOUNTER — Encounter (INDEPENDENT_AMBULATORY_CARE_PROVIDER_SITE_OTHER): Payer: Self-pay | Admitting: Orthopaedic Surgery

## 2017-06-22 ENCOUNTER — Ambulatory Visit (INDEPENDENT_AMBULATORY_CARE_PROVIDER_SITE_OTHER): Payer: Medicare Other

## 2017-06-22 DIAGNOSIS — M25562 Pain in left knee: Secondary | ICD-10-CM | POA: Diagnosis not present

## 2017-06-22 DIAGNOSIS — G8929 Other chronic pain: Secondary | ICD-10-CM

## 2017-06-22 MED ORDER — METHYLPREDNISOLONE ACETATE 40 MG/ML IJ SUSP
40.0000 mg | INTRAMUSCULAR | Status: AC | PRN
  Administered 2017-06-22: 40 mg via INTRA_ARTICULAR

## 2017-06-22 MED ORDER — LIDOCAINE HCL 1 % IJ SOLN
3.0000 mL | INTRAMUSCULAR | Status: AC | PRN
  Administered 2017-06-22: 3 mL

## 2017-06-22 NOTE — Progress Notes (Signed)
Office Visit Note   Patient: Frederick [name]           Date of Birth: June 03, 1961           MRN: [id number] Visit Date: 06/22/2017              Requested by: [facility] [address] [place], [place] [address] PCP: [facility]   Assessment & Plan: Visit Diagnoses:  1. Chronic pain of left knee     Plan: I spoke with him about trying a steroid injection in his left knee as well as a knee brace in combination with physical therapy to work on strengthening his entire left lower extremity. He is agreeable to this. All questions were encouraged and answered. We'll see him back in 4 weeks to see how is doing overall.  Follow-Up Instructions: Return in about 4 weeks (around 07/20/2017).   Orders:  Orders Placed This Encounter  Procedures  . Large Joint Injection/Arthrocentesis  . XR KNEE 3 VIEW LEFT   No orders of the defined types were placed in this encounter.     Procedures: Large Joint Inj Date/Time: 06/22/2017 11:46 AM Performed by: Kathryne Hitch Authorized by: Kathryne Hitch   Location:  Knee Site:  L knee Ultrasound Guidance: No   Fluoroscopic Guidance: No   Arthrogram: No   Medications:  3 mL lidocaine 1 %; 40 mg methylPREDNISolone acetate 40 MG/ML     Clinical Data: No additional findings.   Subjective: Chief Complaint  Patient presents with  . Left Knee - Pain  Patient is very pleasant 56 year old I'm seeing for the first time. He comes in with chief complaint left knee pain. He is someone who in 2014 had an intramedullary nail placed in his left tibia secondary to a distal third tibia shaft fracture. This was done by another surgeon in town and a different group. He says his knee just feels weak and hurts quite a bit. This is been slowly getting worse with time. His wife is with him says he walks very slow like an elderly person.  HPI  Review of Systems He currently denies any headache, chest pain, shortness of breath,  fever, chills, nausea, vomiting he does not walk with assistive device  Objective: Vital Signs: There were no vitals taken for this visit.  Physical Exam He is alert or 3 and in no acute distress Ortho Exam Examination of his left knee shows no effusion. His range of motion is full. There is just a slight laxity in his Lachman's exam with varus and valgus stressing is stable. There is a negative McMurray's exam to the medial lateral compartments. He does have good ankle motion on the left ankle. Specialty Comments:  No specialty comments available.  Imaging: Xr Knee 3 View Left  Result Date: 06/22/2017 3 views of the left knee show no significant arthritic changes or acute findings. He does have evidence of previous surgery from a distal tibia fracture. He has an intramedullary nail that is placed through the knee. The hardware is well seated in not exposing the joint.    PMFS History: Patient Active Problem List   Diagnosis Date Noted  . Hyponatremia 12/03/2015  . Left rib fracture 11/29/2015  . Microcytic anemia 11/29/2015  . Cigarette smoker 11/29/2015  . Protein-calorie malnutrition, severe (HCC) 11/29/2015  . Unintentional weight loss 11/29/2015  . History of tuberculosis 11/28/2015  . Cavitary lesion of lung 11/28/2015  . SBO (small bowel obstruction) (HCC) 03/10/2013  .  Abdominal pain, diffuse 03/10/2013  . Nausea and vomiting 03/10/2013  . Fracture of fibula, proximal 01/21/2013  . Closed fracture of left distal distal tibia 01/21/2013  . EtOH dependence (HCC)   . Fibula fracture   . Hernia    Past Medical History:  Diagnosis Date  . Arthritis   . Asthma   . EtOH dependence (HCC)    drinks 2-3 40s a day  . Fibula fracture    Dr Dion SaucierLandau 02-2011  . Hernia    surgery 01-17-13  . Tuberculosis     Family History  Problem Relation Age of Onset  . Diabetes Mother   . Diabetes Father   . Hypertension Father     Past Surgical History:  Procedure Laterality Date    . ANKLE SURGERY    . INGUINAL HERNIA REPAIR Right 01/19/2013   Procedure: HERNIA REPAIR INGUINAL ADULT;  Surgeon: Atilano InaEric M Wilson, MD,FACS;  Location: [facility] OR;  Service: General;  Laterality: Right;  . TIBIA IM NAIL INSERTION Left 01/22/2013   Procedure: INTRAMEDULLARY (IM) NAIL TIBIAL;  Surgeon: Eulas PostJoshua P Landau, MD;  Location: [facility] OR;  Service: Orthopedics;  Laterality: Left;   Social History   Occupational History  . Not on file.   Social History Main Topics  . Smoking status: Current Every Day Smoker    Packs/day: 0.50    Types: Cigarettes  . Smokeless tobacco: Never Used     Comment: 1 ppd  . Alcohol use 8.4 oz/week    14 Cans of beer per week     Comment: occasionally; generally drinks (2) 40s/day  . Drug use: No  . Sexual activity: No

## 2017-06-22 NOTE — Addendum Note (Signed)
Addended by: Cherre HugerMAY, Ascencion Coye E on: 06/22/2017 11:56 AM   Modules accepted: Orders

## 2017-07-12 ENCOUNTER — Encounter (INDEPENDENT_AMBULATORY_CARE_PROVIDER_SITE_OTHER): Payer: Medicare Other | Admitting: Podiatry

## 2017-07-12 NOTE — Progress Notes (Signed)
This encounter was created in error - please disregard.

## 2017-07-22 DIAGNOSIS — F1210 Cannabis abuse, uncomplicated: Secondary | ICD-10-CM | POA: Insufficient documentation

## 2017-08-22 ENCOUNTER — Ambulatory Visit: Payer: Medicare Other | Admitting: Podiatry

## 2017-09-05 ENCOUNTER — Encounter (INDEPENDENT_AMBULATORY_CARE_PROVIDER_SITE_OTHER): Payer: Medicare Other | Admitting: Podiatry

## 2017-09-05 NOTE — Progress Notes (Signed)
This encounter was created in error - please disregard.

## 2017-10-03 ENCOUNTER — Ambulatory Visit (INDEPENDENT_AMBULATORY_CARE_PROVIDER_SITE_OTHER): Payer: Medicare Other | Admitting: Podiatry

## 2017-10-03 ENCOUNTER — Encounter: Payer: Self-pay | Admitting: Podiatry

## 2017-10-03 DIAGNOSIS — M79674 Pain in right toe(s): Secondary | ICD-10-CM | POA: Diagnosis not present

## 2017-10-03 DIAGNOSIS — M79675 Pain in left toe(s): Secondary | ICD-10-CM

## 2017-10-03 DIAGNOSIS — L84 Corns and callosities: Secondary | ICD-10-CM

## 2017-10-03 DIAGNOSIS — B351 Tinea unguium: Secondary | ICD-10-CM

## 2017-10-03 NOTE — Progress Notes (Signed)
Patient ID: Frederick [name], male   DOB: 08/11/1961, 56 y.o.   MRN: [id number]   Subjective: This patient presents today complaining of a painful fourth right webspace corn. This corn was debrided on the visit of 08/25/2016 and today patient's request debridement. Also, patient complaining of uncomfortable toenails walking wearing shoes and request toenail debridement   Physical Exam  Orientated 3  Vascular: No peripheral edema or calf tenderness bilaterally DP and PT pulses 2/4 bilaterally Capillary reflex immediate bilaterally  Neurological: Sensation to 10 g monofilament wire intact 5/5 bilaterally Vibratory sensation reactive bilaterally Ankle reflex equal and reactive bilaterally  Dermatological: No open skin lesions bilaterally Surgical scar medial right ankle Well-organized soft corn fourth right webspace Corn lateral fifth toes bilaterally The toenails are elongated, discolored, hypertrophic with subungual debris and tenderness to palpation 6-10  Musculoskeletal: HAV bilaterally Varus rotation fifth toes/hammertoes bilaterally Manual motor testing dorsi flexion, plantar flexion, inversion, eversion 5/5 bilaterally   Assessment: Satisfactory neurovascular status Hammertoe fifth bilaterally Fourth right webspace soft corn Mycotic toenails-symptomatic 6-10  Plan: Debride fourth right webspace corn without any bleeding. Discuss treatment options including repetitive debridement or surgical webbing of the fourth right webspace Debrided toenails 6-10 mechanically and electrically without anybleeding  Reappoint 3 months

## 2018-01-03 ENCOUNTER — Ambulatory Visit: Payer: Medicare Other | Admitting: Podiatry

## 2018-04-06 ENCOUNTER — Ambulatory Visit (INDEPENDENT_AMBULATORY_CARE_PROVIDER_SITE_OTHER): Payer: Medicare Other

## 2018-04-06 ENCOUNTER — Encounter: Payer: Self-pay | Admitting: Podiatry

## 2018-04-06 ENCOUNTER — Ambulatory Visit (INDEPENDENT_AMBULATORY_CARE_PROVIDER_SITE_OTHER): Payer: Medicare Other | Admitting: Podiatry

## 2018-04-06 DIAGNOSIS — M2041 Other hammer toe(s) (acquired), right foot: Secondary | ICD-10-CM

## 2018-04-06 DIAGNOSIS — L84 Corns and callosities: Secondary | ICD-10-CM

## 2018-04-06 DIAGNOSIS — B351 Tinea unguium: Secondary | ICD-10-CM | POA: Diagnosis not present

## 2018-04-06 DIAGNOSIS — M79609 Pain in unspecified limb: Secondary | ICD-10-CM

## 2018-04-06 NOTE — Progress Notes (Signed)
Subjective:   Patient ID: Frederick [name], male   DOB: 57 y.o.   MRN: [id number]   HPI Patient presents with severe nail disease 1-5 both feet that are thick incurvated and painful and lesions between the fourth and fifth toe right over left foot which are very painful   ROS      Objective:  Physical Exam  Mycotic nail infection 1-5 both feet with pain and chronic lesion fourth interspace right over left     Assessment:  Revealed and discussed mycotic nails with pain and lesion     Plan:  Aggressive debridement of nailbeds 1-5 accomplished today and debridement of lesions with no iatrogenic bleeding and reappoint for routine care

## 2018-05-09 ENCOUNTER — Ambulatory Visit (INDEPENDENT_AMBULATORY_CARE_PROVIDER_SITE_OTHER): Payer: Medicare Other | Admitting: Orthopaedic Surgery

## 2018-06-21 ENCOUNTER — Ambulatory Visit (INDEPENDENT_AMBULATORY_CARE_PROVIDER_SITE_OTHER): Payer: Medicare Other | Admitting: Orthopaedic Surgery

## 2018-07-31 ENCOUNTER — Ambulatory Visit (INDEPENDENT_AMBULATORY_CARE_PROVIDER_SITE_OTHER): Payer: Medicare Other | Admitting: Orthopaedic Surgery

## 2018-08-11 ENCOUNTER — Ambulatory Visit (INDEPENDENT_AMBULATORY_CARE_PROVIDER_SITE_OTHER): Payer: Medicare Other | Admitting: Podiatry

## 2018-08-11 DIAGNOSIS — M2041 Other hammer toe(s) (acquired), right foot: Secondary | ICD-10-CM

## 2018-08-11 DIAGNOSIS — M79674 Pain in right toe(s): Secondary | ICD-10-CM | POA: Diagnosis not present

## 2018-08-11 NOTE — Progress Notes (Signed)
Subjective:  Patient ID: Frederick [name], male    DOB: Feb 20, 1961,  MRN: [id number]  Chief Complaint  Patient presents with  . Callouses    painful callus/corn right foot    57 y.o. male presents with the above complaint.  Reports painful corn and callus to the right foot.  Has had this issue before request that the corn be cut out   Review of Systems: Negative except as noted in the HPI. Denies N/V/F/Ch.  Past Medical History:  Diagnosis Date  . Arthritis   . Asthma   . EtOH dependence (HCC)    drinks 2-3 40s a day  . Fibula fracture    Dr Dion Saucier 02-2011  . Hernia    surgery 01-17-13  . Tuberculosis     Current Outpatient Medications:  .  benzonatate (TESSALON) 200 MG capsule, Take 1 capsule (200 mg total) by mouth 3 (three) times daily as needed for cough., Disp: 20 capsule, Rfl: 0 .  ferrous sulfate 325 (65 FE) MG tablet, Take 1 tablet (325 mg total) by mouth 2 (two) times daily with a meal., Disp: 60 tablet, Rfl: 0 .  fluticasone (FLONASE) 50 MCG/ACT nasal spray, Place 1 spray into both nostrils daily., Disp: 16 g, Rfl: 2 .  folic acid (FOLVITE) 1 MG tablet, Take 1 tablet (1 mg total) by mouth daily., Disp: 30 tablet, Rfl: 1 .  ipratropium-albuterol (DUONEB) 0.5-2.5 (3) MG/3ML SOLN, Take 3 mLs by nebulization every 6 (six) hours as needed. (Patient taking differently: Take 3 mLs by nebulization every 6 (six) hours as needed (shortness of breath). ), Disp: 360 mL, Rfl: 1 .  loratadine-pseudoephedrine (CLARITIN-D 12 HOUR) 5-120 MG per tablet, Take 1 tablet by mouth 2 (two) times daily., Disp: 30 tablet, Rfl: 0 .  methocarbamol (ROBAXIN) 500 MG tablet, TAKE 1 TABLET TWICE DAILY AS NEEDED FOR MUSCLE SPASM, Disp: , Rfl: 2 .  Multiple Vitamin (MULTIVITAMIN WITH MINERALS) TABS tablet, Take 1 tablet by mouth daily., Disp: 30 tablet, Rfl: 0 .  SPIRIVA RESPIMAT 2.5 MCG/ACT AERS, Inhale 2 sprays into the lungs daily., Disp: , Rfl: 5 .  thiamine 100 MG tablet, Take 1 tablet (100 mg total)  by mouth daily., Disp: 30 tablet, Rfl: 1 .  zolpidem (AMBIEN) 5 MG tablet, Take 1 tablet (5 mg total) by mouth at bedtime as needed for sleep., Disp: 10 tablet, Rfl: 0  Social History   Tobacco Use  Smoking Status Current Every Day Smoker  . Packs/day: 0.50  . Types: Cigarettes  Smokeless Tobacco Never Used  Tobacco Comment   1 ppd    No Known Allergies Objective:  There were no vitals filed for this visit. There is no height or weight on file to calculate BMI. Constitutional Well developed. Well nourished.  Vascular Dorsalis pedis pulses palpable bilaterally. Posterior tibial pulses palpable bilaterally. Capillary refill normal to all digits.  No cyanosis or clubbing noted. Pedal hair growth normal.  Neurologic Normal speech. Oriented to person, place, and time. Epicritic sensation to light touch grossly present bilaterally.  Dermatologic Nails well groomed and normal in appearance. No open wounds. No skin lesions.  Orthopedic: Normal joint ROM without pain or crepitus bilaterally. No visible deformities. Heloma molle right fourth interspace   Radiographs: None today Assessment:   1. Hammer toe of right foot   2. Pain of toe of right foot    Plan:  Patient was evaluated and treated and all questions answered.  Heloma molle right fourth fifth toes -Lesion palliatively  debrided -Discussed with patient that the only way to correct the calluses to surgically correct the hammertoes.  Patient not interested in surgery -Educated on self-care -Discussed she does not meet routine foot care criteria  Return if symptoms worsen or fail to improve.

## 2018-10-03 ENCOUNTER — Ambulatory Visit (INDEPENDENT_AMBULATORY_CARE_PROVIDER_SITE_OTHER): Payer: Medicare Other | Admitting: Orthopaedic Surgery

## 2018-10-26 ENCOUNTER — Ambulatory Visit (INDEPENDENT_AMBULATORY_CARE_PROVIDER_SITE_OTHER): Payer: Medicare Other | Admitting: Podiatry

## 2018-10-26 DIAGNOSIS — Z5329 Procedure and treatment not carried out because of patient's decision for other reasons: Secondary | ICD-10-CM

## 2018-10-27 NOTE — Progress Notes (Signed)
   Complete physical exam  Patient: Frederick [name]   DOB: 09/25/2000   [name]  MRN: [id number]  Subjective:    No chief complaint on file.   Frederick [name] is a 57 y.o. male who presents today for a complete physical exam. She reports consuming a {diet types:17450} diet. {types:19826} She generally feels {DESC; WELL/FAIRLY WELL/POORLY:18703}. She reports sleeping {DESC; WELL/FAIRLY WELL/POORLY:18703}. She {does/does not:200015} have additional problems to discuss today.    Most recent fall risk assessment:    06/02/2022   10:42 AM  Fall Risk   Falls in the past year? 0  Number falls in past yr: 0  Injury with Fall? 0  Risk for fall due to : No Fall Risks  Follow up Falls evaluation completed     Most recent depression screenings:    06/02/2022   10:42 AM 04/23/2021   10:46 AM  PHQ 2/9 Scores  PHQ - 2 Score 0 0  PHQ- 9 Score 5     {VISON DENTAL STD PSA (Optional):27386}  {History (Optional):23778}  Patient Care Team: Jessup, Joy, NP as PCP - General (Nurse Practitioner)   Outpatient Medications Prior to Visit  Medication Sig   fluticasone (FLONASE) 50 MCG/ACT nasal spray Place 2 sprays into both nostrils in the morning and at bedtime. After 7 days, reduce to once daily.   norgestimate-ethinyl estradiol (SPRINTEC 28) 0.25-35 MG-MCG tablet Take 1 tablet by mouth daily.   Nystatin POWD Apply liberally to affected area 2 times per day   spironolactone (ALDACTONE) 100 MG tablet Take 1 tablet (100 mg total) by mouth daily.   No facility-administered medications prior to visit.    ROS        Objective:     There were no vitals taken for this visit. {Vitals History (Optional):23777}  Physical Exam   No results found for any visits on 07/08/22. {Show previous labs (optional):23779}    Assessment & Plan:    Routine Health Maintenance and Physical Exam  Immunization History  Administered Date(s) Administered   DTaP 12/09/1999, 02/04/2000,  04/14/2000, 12/29/2000, 07/14/2004   Hepatitis A 05/10/2008, 05/16/2009   Hepatitis B 09/26/1999, 11/03/1999, 04/14/2000   HiB (PRP-OMP) 12/09/1999, 02/04/2000, 04/14/2000, 12/29/2000   IPV 12/09/1999, 02/04/2000, 10/03/2000, 07/14/2004   Influenza,inj,Quad PF,6+ Mos 08/16/2014   Influenza-Unspecified 11/15/2012   MMR 10/03/2001, 07/14/2004   Meningococcal Polysaccharide 05/15/2012   Pneumococcal Conjugate-13 12/29/2000   Pneumococcal-Unspecified 04/14/2000, 06/28/2000   Tdap 05/15/2012   Varicella 10/03/2000, 05/10/2008    Health Maintenance  Topic Date Due   HIV Screening  Never done   Hepatitis C Screening  Never done   INFLUENZA VACCINE  07/06/2022   PAP-Cervical Cytology Screening  07/08/2022 (Originally 09/24/2020)   PAP SMEAR-Modifier  07/08/2022 (Originally 09/24/2020)   TETANUS/TDAP  07/08/2022 (Originally 05/15/2022)   HPV VACCINES  Discontinued   COVID-19 Vaccine  Discontinued    Discussed health benefits of physical activity, and encouraged her to engage in regular exercise appropriate for her age and condition.  Problem List Items Addressed This Visit   None Visit Diagnoses     Annual physical exam    -  Primary   Cervical cancer screening       Need for Tdap vaccination          No follow-ups on file.     Joy Jessup, NP   

## 2019-03-06 ENCOUNTER — Ambulatory Visit: Payer: Medicare Other | Admitting: Podiatry

## 2019-04-05 ENCOUNTER — Ambulatory Visit: Payer: Medicare Other | Admitting: Podiatry

## 2019-05-30 ENCOUNTER — Emergency Department (HOSPITAL_COMMUNITY)
Admission: EM | Admit: 2019-05-30 | Discharge: 2019-05-31 | Disposition: A | Discharge status: Home / Self Care | Payer: Medicare Other | Attending: Emergency Medicine | Admitting: Emergency Medicine

## 2019-05-30 ENCOUNTER — Other Ambulatory Visit: Payer: Self-pay

## 2019-05-30 ENCOUNTER — Encounter (HOSPITAL_COMMUNITY): Payer: Self-pay | Admitting: Emergency Medicine

## 2019-05-30 DIAGNOSIS — F10920 Alcohol use, unspecified with intoxication, uncomplicated: Secondary | ICD-10-CM

## 2019-05-30 DIAGNOSIS — Y908 Blood alcohol level of 240 mg/100 ml or more: Secondary | ICD-10-CM | POA: Insufficient documentation

## 2019-05-30 DIAGNOSIS — A159 Respiratory tuberculosis unspecified: Secondary | ICD-10-CM | POA: Insufficient documentation

## 2019-05-30 DIAGNOSIS — R4689 Other symptoms and signs involving appearance and behavior: Secondary | ICD-10-CM | POA: Insufficient documentation

## 2019-05-30 DIAGNOSIS — R2681 Unsteadiness on feet: Secondary | ICD-10-CM

## 2019-05-30 DIAGNOSIS — Z046 Encounter for general psychiatric examination, requested by authority: Secondary | ICD-10-CM | POA: Diagnosis present

## 2019-05-30 DIAGNOSIS — F10120 Alcohol abuse with intoxication, uncomplicated: Secondary | ICD-10-CM | POA: Diagnosis not present

## 2019-05-30 DIAGNOSIS — Z79899 Other long term (current) drug therapy: Secondary | ICD-10-CM | POA: Insufficient documentation

## 2019-05-30 DIAGNOSIS — F17210 Nicotine dependence, cigarettes, uncomplicated: Secondary | ICD-10-CM | POA: Insufficient documentation

## 2019-05-30 LAB — COMPREHENSIVE METABOLIC PANEL
ALT: 118 U/L — ABNORMAL HIGH (ref 0–44)
AST: 146 U/L — ABNORMAL HIGH (ref 15–41)
Albumin: 3.2 g/dL — ABNORMAL LOW (ref 3.5–5.0)
Alkaline Phosphatase: 121 U/L (ref 38–126)
Anion gap: 13 (ref 5–15)
BUN: <5 mg/dL — ABNORMAL LOW (ref 6–20)
CO2: 23 mmol/L (ref 22–32)
Calcium: 9.3 mg/dL (ref 8.9–10.3)
Chloride: 104 mmol/L (ref 98–111)
Creatinine, Ser: 0.69 mg/dL (ref 0.61–1.24)
GFR calc Af Amer: >60 mL/min (ref >60)
GFR calc non Af Amer: >60 mL/min (ref >60)
Glucose, Bld: 100 mg/dL — ABNORMAL HIGH (ref 70–99)
Potassium: 3.7 mmol/L (ref 3.5–5.1)
Sodium: 140 mmol/L (ref 135–145)
Total Bilirubin: 0.3 mg/dL (ref 0.3–1.2)
Total Protein: 8.2 g/dL — ABNORMAL HIGH (ref 6.5–8.1)

## 2019-05-30 LAB — ETHANOL: Alcohol, Ethyl (B): 401 mg/dL (ref <10)

## 2019-05-30 LAB — CBC
HCT: 41 % (ref 39.0–52.0)
Hemoglobin: 14 g/dL (ref 13.0–17.0)
MCH: 31.5 pg (ref 26.0–34.0)
MCHC: 34.1 g/dL (ref 30.0–36.0)
MCV: 92.3 fL (ref 80.0–100.0)
Platelets: 195 10*3/uL (ref 150–400)
RBC: 4.44 MIL/uL (ref 4.22–5.81)
RDW: 14.4 % (ref 11.5–15.5)
WBC: 4.1 10*3/uL (ref 4.0–10.5)
nRBC: 0 % (ref 0.0–0.2)

## 2019-05-30 LAB — ACETAMINOPHEN LEVEL: Acetaminophen (Tylenol), Serum: <10 ug/mL — ABNORMAL LOW (ref 10–30)

## 2019-05-30 LAB — SALICYLATE LEVEL: Salicylate Lvl: <7 mg/dL (ref 2.8–30.0)

## 2019-05-30 NOTE — ED Provider Notes (Addendum)
[facility] Provider Note   CSN: [id number] Arrival date & time: 05/30/19  2115    History   Chief Complaint Chief Complaint  Patient presents with   IVC    HPI Frederick [name] is a 58 y.o. male.     HPI  This is a 58 year old male with a history of alcohol abuse, tuberculosis who presents under IVC in police custody.  Per IVC paperwork, patient was being aggressive with his spouse.  Spouse documented that he drinks daily and becomes very aggressive and combative.  Additionally, he is not taking his lung medications.  On my evaluation, the patient is cooperative but at times does get loud.  He reports that he is here because he has bilateral knee pain.  Reports a history of arthritis.  No new trauma.  No fevers or lip swelling noted.  He rates his pain at 4 out of 10.  When questioned regarding his aggression at home and his drinking, patient reports "I need to talk to my wife."  Patient denies any suicidal or homicidal ideation.  He denies hearing voices.  He does not want to stop drinking.  I attempted to call the patient's wife and left a message.  Past Medical History:  Diagnosis Date   Arthritis    Asthma    EtOH dependence (HCC)    drinks 2-3 40s a day   Fibula fracture    Dr Dion SaucierLandau 02-2011   Hernia    surgery 01-17-13   Tuberculosis     Patient Active Problem List   Diagnosis Date Noted   Marijuana abuse 07/22/2017   Hyponatremia 12/03/2015   Left rib fracture 11/29/2015   Microcytic anemia 11/29/2015   Cigarette smoker 11/29/2015   Protein-calorie malnutrition, severe (HCC) 11/29/2015   Unintentional weight loss 11/29/2015   History of tuberculosis 11/28/2015   Cavitary lesion of lung 11/28/2015   SBO (small bowel obstruction) (HCC) 03/10/2013   Abdominal pain, diffuse 03/10/2013   Nausea and vomiting 03/10/2013   Fracture of fibula, proximal 01/21/2013   Closed fracture of left distal distal tibia  01/21/2013   EtOH dependence (HCC)    Fibula fracture    Hernia     Past Surgical History:  Procedure Laterality Date   ANKLE SURGERY     INGUINAL HERNIA REPAIR Right 01/19/2013   Procedure: HERNIA REPAIR INGUINAL ADULT;  Surgeon: Atilano InaEric M Wilson, MD,FACS;  Location: [facility] OR;  Service: General;  Laterality: Right;   TIBIA IM NAIL INSERTION Left 01/22/2013   Procedure: INTRAMEDULLARY (IM) NAIL TIBIAL;  Surgeon: Eulas PostJoshua P Landau, MD;  Location: [facility] OR;  Service: Orthopedics;  Laterality: Left;        Home Medications    Prior to Admission medications   Medication Sig Start Date End Date Taking? Authorizing Provider  benzonatate (TESSALON) 200 MG capsule Take 1 capsule (200 mg total) by mouth 3 (three) times daily as needed for cough. 12/02/15   Kathlen ModyAkula, Vijaya, MD  ferrous sulfate 325 (65 FE) MG tablet Take 1 tablet (325 mg total) by mouth 2 (two) times daily with a meal. 12/03/15   Renae FickleShort, Mackenzie, MD  fluticasone (FLONASE) 50 MCG/ACT nasal spray Place 1 spray into both nostrils daily. 07/28/15   Hedges, Tinnie GensJeffrey, PA-C  folic acid (FOLVITE) 1 MG tablet Take 1 tablet (1 mg total) by mouth daily. 12/02/15   Kathlen ModyAkula, Vijaya, MD  ipratropium-albuterol (DUONEB) 0.5-2.5 (3) MG/3ML SOLN Take 3 mLs by nebulization every 6 (six) hours as needed.  Patient taking differently: Take 3 mLs by nebulization every 6 (six) hours as needed (shortness of breath).  12/02/15   Hosie Poisson, MD  loratadine-pseudoephedrine (CLARITIN-D 12 HOUR) 5-120 MG per tablet Take 1 tablet by mouth 2 (two) times daily. 05/13/15   Leo Grosser, MD  methocarbamol (ROBAXIN) 500 MG tablet TAKE 1 TABLET TWICE DAILY AS NEEDED FOR MUSCLE SPASM 02/19/18   [provider]  Multiple Vitamin (MULTIVITAMIN WITH MINERALS) TABS tablet Take 1 tablet by mouth daily. 05/31/19   Ronesha Heenan, Barbette Hair, MD  SPIRIVA RESPIMAT 2.5 MCG/ACT AERS Inhale 2 sprays into the lungs daily. 11/24/15   [provider]  thiamine 100 MG tablet Take 1  tablet (100 mg total) by mouth daily. 05/31/19   Tiyah Zelenak, Barbette Hair, MD  zolpidem (AMBIEN) 5 MG tablet Take 1 tablet (5 mg total) by mouth at bedtime as needed for sleep. 12/02/15   Hosie Poisson, MD    Family History Family History  Problem Relation Age of Onset   Diabetes Mother    Diabetes Father    Hypertension Father     Social History Social History   Tobacco Use   Smoking status: Current Every Day Smoker    Packs/day: 0.50    Types: Cigarettes   Smokeless tobacco: Never Used   Tobacco comment: 1 ppd  Substance Use Topics   Alcohol use: Yes    Alcohol/week: 14.0 standard drinks    Types: 14 Cans of beer per week    Comment: occasionally; generally drinks (2) 40s/day   Drug use: No     Allergies   Patient has no known allergies.   Review of Systems Review of Systems  Constitutional: Negative for fever.  Respiratory: Negative for shortness of breath.   Cardiovascular: Negative for chest pain.  Gastrointestinal: Negative for abdominal pain.  Musculoskeletal:       Bilateral knee pain  Psychiatric/Behavioral: Positive for agitation. Negative for self-injury. The patient is not nervous/anxious.   All other systems reviewed and are negative.    Physical Exam Updated Vital Signs BP 128/90 (BP Location: Left Arm)    Pulse 100    Temp 97.7 F (36.5 C) (Oral)    Resp 16    SpO2 100%   Physical Exam Vitals signs and nursing note reviewed.  Constitutional:      Appearance: He is well-developed.     Comments: Disheveled appearing but nontoxic intermittently agitated  HENT:     Head: Normocephalic and atraumatic.  Eyes:     Pupils: Pupils are equal, round, and reactive to light.  Cardiovascular:     Rate and Rhythm: Normal rate and regular rhythm.  Pulmonary:     Effort: Pulmonary effort is normal.     Breath sounds: Normal breath sounds.  Abdominal:     Tenderness: There is no rebound.  Musculoskeletal:     Comments: Normal range of motion of  bilateral knees, no obvious erythema or swelling, no deformity  Skin:    General: Skin is warm and dry.  Neurological:     Mental Status: He is alert and oriented to person, place, and time.  Psychiatric:     Comments: Intermittent agitation, appears intoxicated      ED Treatments / Results  Labs (all labs ordered are listed, but only abnormal results are displayed) Labs Reviewed  COMPREHENSIVE METABOLIC PANEL - Abnormal; Notable for the following components:      Result Value   Glucose, Bld 100 (*)    BUN <5 (*)  Total Protein 8.2 (*)    Albumin 3.2 (*)    AST 146 (*)    ALT 118 (*)    All other components within normal limits  ETHANOL - Abnormal; Notable for the following components:   Alcohol, Ethyl (B) 401 (*)    All other components within normal limits  ACETAMINOPHEN LEVEL - Abnormal; Notable for the following components:   Acetaminophen (Tylenol), Serum <10 (*)    All other components within normal limits  URINALYSIS, ROUTINE W REFLEX MICROSCOPIC - Abnormal; Notable for the following components:   Hgb urine dipstick SMALL (*)    Protein, ur 30 (*)    Bacteria, UA RARE (*)    All other components within normal limits  SALICYLATE LEVEL  CBC  RAPID URINE DRUG SCREEN, HOSP PERFORMED    EKG None  Radiology Dg Chest 2 View  Result Date: 05/31/2019 CLINICAL DATA:  58 year old male with history of TB. No chest complaints currently. EXAM: CHEST - 2 VIEW COMPARISON:  Chest radiograph dated 08/20/2016 FINDINGS: Chronic changes of the right a pickle region with scarring and bullous changes. No new consolidative changes there is no pleural effusion or pneumothorax. The cardiac silhouette is within normal limits. No acute osseous pathology. Old right clavicular fracture. IMPRESSION: No active cardiopulmonary disease. Electronically Signed   By: Elgie CollardArash  Radparvar M.D.   On: 05/31/2019 00:38    Procedures Procedures (including critical care time)  CRITICAL CARE Performed  by: Shon Batonourtney F Oron Westrup   Total critical care time: 35 minutes Multiple reassessments  Critical care time was exclusive of separately billable procedures and treating other patients.  Critical care was necessary to treat or prevent imminent or life-threatening deterioration.  Critical care was time spent personally by me on the following activities: development of treatment plan with patient and/or surrogate as well as nursing, discussions with consultants, evaluation of patient's response to treatment, examination of patient, obtaining history from patient or surrogate, ordering and performing treatments and interventions, ordering and review of laboratory studies, ordering and review of radiographic studies, pulse oximetry and re-evaluation of patient's condition.   Medications Ordered in ED Medications - No data to display   Initial Impression / Assessment and Plan / ED Course  I have reviewed the triage vital signs and the nursing notes.  Pertinent labs & imaging results that were available during my care of the patient were reviewed by me and considered in my medical decision making (see chart for details).  Clinical Course as of May 31 219  Thu May 31, 2019  0001 Spoke to the patient's wife.  She is concerned because he has lost weight recently and refuses to go to the doctor.  She reports that he has fecal and urinary incontinence frequently over the last many months.  He does drink daily.  She denies any abuse history.  Denies any homicidal or suicidal ideation.   [CH]  0115 Patient reassessed multiple times.  He is adamant that he wants to go home.  He is awake, alert, and oriented.  He does appear intoxicated but otherwise appears to have capacity to make medical decisions.  I have encouraged him to stay for full work-up.   [CH]  0217 Patient ambulated in the hall.  He is very shaky and unsteady on his feet but states that he normally walks with a cane.  He is again adamant that  he wants to go home and does not want to be evaluated by physical therapy.  He appears to  have capacity.  Will call his wife.  IVC paperwork was rescinded as I do not feel he meets criteria for inpatient evaluation.   [CH]    Clinical Course User Index [CH] Elizeth Weinrich, Mayer Maskerourtney F, MD       Patient presents under IVC.  He is adamant that he wants to go home.  He appears acutely intoxicated.  I spoke at length with the patient's wife.  She is concerned that he is not receiving medical care and refuses to be evaluated.  She reports a several month history of him peeing and pooping in the house and gait disturbance.  He continues to drink.  She denies any self-harm behaviors or threatening behaviors.  I discussed with her that he appears to have capacity to make his own decisions but I would attempt to assess him.  However, I am not sure that he meets criteria for involuntary commitment.  See clinical course above.  His lab work-up is notable for mild elevation in LFTs and a blood alcohol level of 401.  He is oriented otherwise.  He appears to have capacity for medical decision making and on multiple reevaluation is requesting discharge.  His vital signs are reassuring.  At this time I do not feel he meets criteria for involuntary commitment.  He is very unsteady on his feet but reports that he walks with a walker at home.  He is declining physical therapy evaluation.  Patient's wife updated.  Will discharge with thiamine and a multivitamin as I believe he likely is deficient given his chronic alcohol abuse.  He has no desire to quit drinking.  2:30 AM Wife states that she cannot pick him up this morning.  Feel he is best discharged into the care of someone else as he still appears intoxicated.  He will rest overnight and be picked up in the morning.  After history, exam, and medical workup I feel the patient has been appropriately medically screened and is safe for discharge home. Pertinent diagnoses were  discussed with the patient. Patient was given return precautions.    Final Clinical Impressions(s) / ED Diagnoses   Final diagnoses:  Alcoholic intoxication without complication (HCC)  Unsteady gait    ED Discharge Orders         Ordered    thiamine 100 MG tablet  Daily     05/31/19 0220    Multiple Vitamin (MULTIVITAMIN WITH MINERALS) TABS tablet  Daily     05/31/19 0220           Shon BatonHorton, Mirabelle Cyphers F, MD 05/31/19 0231    Shon BatonHorton, Tyaire Odem F, MD 05/31/19 [phone]

## 2019-05-30 NOTE — ED Triage Notes (Signed)
Pt BIB [facility] under IVC, per paperwork, pt has been drinking in excess, not tending to his personal hygiene, and becoming aggressive. In triage, pt pleasant, has no complaints.

## 2019-05-30 NOTE — ED Notes (Signed)
Patients belongings are in [place] [address]. Beckie Busing, Therapist, sports. Has been notified.

## 2019-05-30 NOTE — ED Notes (Signed)
[facility] pts spouse

## 2019-05-30 NOTE — ED Notes (Signed)
Pt with TB per IVC paper, CN notified that pt is TB pt, Frederick [name], CN states to make sure that every body uses N95 because there is not negative pressure room available.

## 2019-05-30 NOTE — ED Notes (Signed)
Pt very aggressive to staff, screaming and yelling out of his lung on the room, [employer] and security notified to address the pt.

## 2019-05-31 ENCOUNTER — Emergency Department (HOSPITAL_COMMUNITY): Payer: Medicare Other

## 2019-05-31 DIAGNOSIS — F10120 Alcohol abuse with intoxication, uncomplicated: Secondary | ICD-10-CM | POA: Diagnosis not present

## 2019-05-31 LAB — URINALYSIS, ROUTINE W REFLEX MICROSCOPIC
Bilirubin Urine: NEGATIVE
Glucose, UA: NEGATIVE mg/dL
Ketones, ur: NEGATIVE mg/dL
Leukocytes,Ua: NEGATIVE
Nitrite: NEGATIVE
Protein, ur: 30 mg/dL — AB
Specific Gravity, Urine: 1.012 (ref 1.005–1.030)
pH: 5 (ref 5.0–8.0)

## 2019-05-31 LAB — RAPID URINE DRUG SCREEN, HOSP PERFORMED
Amphetamines: NOT DETECTED
Barbiturates: NOT DETECTED
Benzodiazepines: NOT DETECTED
Cocaine: NOT DETECTED
Opiates: NOT DETECTED
Tetrahydrocannabinol: POSITIVE — AB

## 2019-05-31 MED ORDER — THIAMINE HCL 100 MG PO TABS: 100.0000 mg | ORAL_TABLET | Freq: Every day | ORAL | 1 refills | Status: DC

## 2019-05-31 MED ORDER — ADULT MULTIVITAMIN W/MINERALS CH: 1.0000 | ORAL_TABLET | Freq: Every day | ORAL | 0 refills | Status: DC

## 2019-05-31 NOTE — ED Notes (Signed)
Pt's girlfriend sts she has to take her car to the shop that opens at [place] and will not be able to get here until after her car is fixed. Pt ready to go, sts he will take a bus.

## 2019-05-31 NOTE — ED Notes (Signed)
Pt ambulated in the hallway. Still wants to take bus home. Pt wheeled to bus stop by ED staff.

## 2019-05-31 NOTE — ED Notes (Signed)
Attempt made to get a ride to pt to go home and wife states she can't pick him up because car problems. Per Dr. Dina Rich pt need to stay here until wife can pick him up in the morning. Pt become more verbally aggressive against staff.

## 2019-05-31 NOTE — Discharge Instructions (Addendum)
You were seen today after IVC paperwork was filled out.  Your basic lab work and work-up is reassuring.  You were acutely intoxicated and appeared to have an unsteady gait.  You need to make sure to use assistive devices when walking so that you do not fall.  Follow-up with your primary physician.

## 2019-05-31 NOTE — ED Notes (Signed)
Patient verbalizes understanding of discharge instructions. Opportunity for questioning and answers were provided. Armband removed by staff, pt discharged from ED.  

## 2019-07-04 ENCOUNTER — Other Ambulatory Visit: Payer: Self-pay

## 2019-07-04 ENCOUNTER — Encounter: Payer: Self-pay | Admitting: Orthopaedic Surgery

## 2019-07-04 ENCOUNTER — Ambulatory Visit (INDEPENDENT_AMBULATORY_CARE_PROVIDER_SITE_OTHER): Payer: Medicare Other | Admitting: Orthopaedic Surgery

## 2019-07-04 DIAGNOSIS — M25562 Pain in left knee: Secondary | ICD-10-CM

## 2019-07-04 DIAGNOSIS — G8929 Other chronic pain: Secondary | ICD-10-CM | POA: Diagnosis not present

## 2019-07-04 DIAGNOSIS — M25561 Pain in right knee: Secondary | ICD-10-CM

## 2019-07-04 MED ORDER — LIDOCAINE HCL 1 % IJ SOLN
0.5000 mL | INTRAMUSCULAR | Status: AC | PRN
  Administered 2019-07-04: .5 mL

## 2019-07-04 MED ORDER — METHYLPREDNISOLONE ACETATE 40 MG/ML IJ SUSP
40.0000 mg | INTRAMUSCULAR | Status: AC | PRN
  Administered 2019-07-04: 40 mg via INTRA_ARTICULAR

## 2019-07-04 MED ORDER — METHYLPREDNISOLONE ACETATE 40 MG/ML IJ SUSP
40.0000 mg | INTRAMUSCULAR | Status: AC | PRN
  Administered 2019-07-04: 12:00:00 40 mg via INTRA_ARTICULAR

## 2019-07-04 NOTE — Progress Notes (Signed)
   Procedure Note  Patient: Frederick [name]             Date of Birth: 1961-04-07           MRN: [id number]             Visit Date: 07/04/2019 HPI: Mr. Mudrick is well-known to Dr. Ninfa Linden service comes in today requesting injection of both knees.  He is someone that had an IM nail placed due to a left tibia fracture in 2014.  He is had chronic left knee pain.  He has also chronic right knee pain.  He has had no new injury to either knee.  States the injections gave him good relief in the past in both knees.  He is nondiabetic.  Physical exam: Bilateral knees no abnormal warmth erythema or effusion.  Good range of motion of both knees.  Procedures: Visit Diagnoses:  1. Chronic pain of left knee   2. Chronic pain of right knee     Large Joint Inj: bilateral knee on 07/04/2019 11:45 AM Indications: pain Details: 22 G 1.5 in needle, anterolateral approach  Arthrogram: No  Medications (Right): 0.5 mL lidocaine 1 %; 40 mg methylPREDNISolone acetate 40 MG/ML Medications (Left): 0.5 mL lidocaine 1 %; 40 mg methylPREDNISolone acetate 40 MG/ML Outcome: tolerated well, no immediate complications Procedure, treatment alternatives, risks and benefits explained, specific risks discussed. Consent was given by the patient. Immediately prior to procedure a time out was called to verify the correct patient, procedure, equipment, support staff and site/side marked as required. Patient was prepped and draped in the usual sterile fashion.    Plan: I am follow-up with [place] on as-needed basis.  He understands that he needs to wait at least 3 months between injections in the knees.  He did also aspirated kissing corn that he has on his right foot between the fourth and fifth toes.  Given a pullover silicone sleeve that he can use during the day to offload this area.  Check his foot daily for any skin breakdown.  Questions encouraged and answered

## 2019-07-25 ENCOUNTER — Ambulatory Visit: Payer: Medicare Other | Admitting: Orthopaedic Surgery

## 2019-12-11 ENCOUNTER — Other Ambulatory Visit: Payer: Self-pay

## 2019-12-11 ENCOUNTER — Ambulatory Visit: Payer: Self-pay

## 2019-12-11 ENCOUNTER — Ambulatory Visit (INDEPENDENT_AMBULATORY_CARE_PROVIDER_SITE_OTHER): Payer: Medicare Other | Admitting: Orthopaedic Surgery

## 2019-12-11 ENCOUNTER — Encounter: Payer: Self-pay | Admitting: Orthopaedic Surgery

## 2019-12-11 ENCOUNTER — Ambulatory Visit (INDEPENDENT_AMBULATORY_CARE_PROVIDER_SITE_OTHER): Payer: Medicare Other

## 2019-12-11 DIAGNOSIS — R269 Unspecified abnormalities of gait and mobility: Secondary | ICD-10-CM

## 2019-12-11 DIAGNOSIS — M25561 Pain in right knee: Secondary | ICD-10-CM

## 2019-12-11 DIAGNOSIS — G8929 Other chronic pain: Secondary | ICD-10-CM

## 2019-12-11 DIAGNOSIS — M25562 Pain in left knee: Secondary | ICD-10-CM

## 2019-12-11 MED ORDER — METHYLPREDNISOLONE ACETATE 40 MG/ML IJ SUSP
40.0000 mg | INTRAMUSCULAR | Status: AC | PRN
  Administered 2019-12-11: 40 mg via INTRA_ARTICULAR

## 2019-12-11 MED ORDER — METHYLPREDNISOLONE ACETATE 40 MG/ML IJ SUSP
40.0000 mg | INTRAMUSCULAR | Status: AC | PRN
  Administered 2019-12-11: 12:00:00 40 mg via INTRA_ARTICULAR

## 2019-12-11 MED ORDER — LIDOCAINE HCL 1 % IJ SOLN
5.0000 mL | INTRAMUSCULAR | Status: AC | PRN
  Administered 2019-12-11: 5 mL

## 2019-12-11 MED ORDER — LIDOCAINE HCL 1 % IJ SOLN
0.5000 mL | INTRAMUSCULAR | Status: AC | PRN
  Administered 2019-12-11: .5 mL

## 2019-12-11 NOTE — Progress Notes (Addendum)
Office Visit Note   Patient: Frederick [name]           Date of Birth: 31-May-1961           MRN: [id number] Visit Date: 12/11/2019              Requested by: [facility] [address] [place],  [place] [address] PCP: [facility]   Assessment & Plan: Visit Diagnoses:  1. Chronic pain of left knee   2. Chronic pain of right knee   3. Gait disturbance     Plan:  Advised him to see his primary care physician who he has not seen since before the Covid pandemic started.  We will also refer him to neurology for his gait disturbance.  Recommend electric wheelchair due to his severe gait disturbance.  He will work on quad strengthening exercises as shown and I did ask him to begin riding the stationary bike he has at home to try to strengthen his legs in the interim.  In regards to the knee pain he can follow-up in 3 months if he finds the injections beneficial and would like to to repeat these.  Follow-Up Instructions: Return if symptoms worsen or fail to improve.   Orders:  Orders Placed This Encounter  Procedures  . XR Knee 1-2 Views Left  . XR Knee 1-2 Views Right   No orders of the defined types were placed in this encounter.     Procedures: Large Joint Inj: bilateral knee on 12/11/2019 12:12 PM Indications: pain Details: 22 G 1.5 in needle, anterolateral approach  Arthrogram: No  Medications (Right): 0.5 mL lidocaine 1 %; 40 mg methylPREDNISolone acetate 40 MG/ML Medications (Left): 40 mg methylPREDNISolone acetate 40 MG/ML; 5 mL lidocaine 1 % Aspirate (Left): 1 mL yellow Outcome: tolerated well, no immediate complications Procedure, treatment alternatives, risks and benefits explained, specific risks discussed. Consent was given by the patient. Immediately prior to procedure a time out was called to verify the correct patient, procedure, equipment, support staff and site/side marked as required. Patient was prepped and draped in the usual sterile fashion.        Clinical Data: No additional findings.   Subjective: Chief Complaint  Patient presents with  . Left Knee - Injections  . Right Knee - Injections    HPI Frederick [name] comes in today wanting injections in both knees.  He last had injection of both knees 07/04/2019.  States the injections helped him walk.  He has pain in both knees but is equal.  He has had no new injury to either knee.  He is also asking for an electric wheelchair prescription due to the fact that he is unable to walk without a cane and has difficulty even walking with a cane.  States his knees started bothering him 6 weeks ago however he has had weakness in both legs for the last 6 months or so.  He says it started after he was last seen in our office in July.  He denies any severe back pain.  He denies any radicular symptoms down either leg.  He has had no headaches, visual changes, fevers or chills.  However he has had an unintentional weight loss since he was seen by [place] in July of 15 to 20 pounds.  He does drink two 40 ounce beers a day 1 in the morning 1 in the evening.Denies any weakness over the upper extremities.  Review of Systems Please see HPI otherwise negative.  Objective: Vital Signs: There were no vitals taken for this visit.  Physical Exam Constitutional:      Appearance: He is not ill-appearing or diaphoretic.  Pulmonary:     Effort: Pulmonary effort is normal.  Neurological:     Mental Status: He is alert and oriented to person, place, and time.  Psychiatric:        Mood and Affect: Mood normal.        Behavior: Behavior normal.     Ortho Exam Bilateral knees good range of motion.  No abnormal warmth erythema.  Left knee with some fullness plus minus effusion over the prepatellar area.  No instability valgus varus stressing of either knee.  Bilateral lower extremities 5/5 out of strength throughout the lower extremities against resistance including inversion /eversion of both feet and   dorsiflexion plantarflexion both ankles.  Negative straight leg raise bilaterally.  He is able to do straight leg raise actively bilaterally.  Sensation grossly intact to light touch bilateral feet.  Calves are supple and nontender.  Atrophy of the quad muscles bilaterally.  Positive gait disturbance with weakness with attempts of ambulation.  Specialty Comments:  No specialty comments available.  Imaging: XR Knee 1-2 Views Left  Result Date: 12/11/2019 Left knee AP view and lateral: No acute fractures.  Status post IM nailing left tibia fracture with retained hardware.  Medial lateral patellofemoral joint lines are well-maintained.  Posttraumatic changes of the fibular shaft present.  XR Knee 1-2 Views Right  Result Date: 12/11/2019 Right knee AP lateral views: No acute fractures.  Knee overall well-maintained.  Patient traumatic changes of the proximal fibular shaft.  Otherwise no bony abnormalities.    PMFS History: Patient Active Problem List   Diagnosis Date Noted  . Marijuana abuse 07/22/2017  . Hyponatremia 12/03/2015  . Left rib fracture 11/29/2015  . Microcytic anemia 11/29/2015  . Cigarette smoker 11/29/2015  . Protein-calorie malnutrition, severe (HCC) 11/29/2015  . Unintentional weight loss 11/29/2015  . History of tuberculosis 11/28/2015  . Cavitary lesion of lung 11/28/2015  . SBO (small bowel obstruction) (HCC) 03/10/2013  . Abdominal pain, diffuse 03/10/2013  . Nausea and vomiting 03/10/2013  . Fracture of fibula, proximal 01/21/2013  . Closed fracture of left distal distal tibia 01/21/2013  . EtOH dependence (HCC)   . Fibula fracture   . Hernia    Past Medical History:  Diagnosis Date  . Arthritis   . Asthma   . EtOH dependence (HCC)    drinks 2-3 40s a day  . Fibula fracture    Dr Dion Saucier 02-2011  . Hernia    surgery 01-17-13  . Tuberculosis     Family History  Problem Relation Age of Onset  . Diabetes Mother   . Diabetes Father   . Hypertension  Father     Past Surgical History:  Procedure Laterality Date  . ANKLE SURGERY    . INGUINAL HERNIA REPAIR Right 01/19/2013   Procedure: HERNIA REPAIR INGUINAL ADULT;  Surgeon: [name], MD,FACS;  Location: [facility] OR;  Service: General;  Laterality: Right;  . TIBIA IM NAIL INSERTION Left 01/22/2013   Procedure: INTRAMEDULLARY (IM) NAIL TIBIAL;  Surgeon: [name], MD;  Location: [facility] OR;  Service: Orthopedics;  Laterality: Left;   Social History   Occupational History  . Not on file  Tobacco Use  . Smoking status: Current Every Day Smoker    Packs/day: 0.50    Types: Cigarettes  . Smokeless tobacco: Never Used  .  Tobacco comment: 1 ppd  Substance and Sexual Activity  . Alcohol use: Yes    Alcohol/week: 14.0 standard drinks    Types: 14 Cans of beer per week    Comment: occasionally; generally drinks (2) 40s/day  . Drug use: No  . Sexual activity: Never

## 2019-12-25 ENCOUNTER — Ambulatory Visit: Payer: Medicare Other | Admitting: Neurology

## 2019-12-25 ENCOUNTER — Encounter: Payer: Self-pay | Admitting: Neurology

## 2019-12-25 ENCOUNTER — Telehealth: Payer: Self-pay

## 2019-12-25 NOTE — Telephone Encounter (Signed)
Patient called and same day canceled his 12/25/2019 appt with Dr. Frances Furbish. This is his first no show.

## 2020-01-30 ENCOUNTER — Ambulatory Visit: Payer: Medicare Other | Admitting: Neurology

## 2020-02-12 ENCOUNTER — Encounter (HOSPITAL_COMMUNITY): Payer: Self-pay | Admitting: Emergency Medicine

## 2020-02-12 ENCOUNTER — Other Ambulatory Visit: Payer: Self-pay

## 2020-02-12 ENCOUNTER — Emergency Department (HOSPITAL_COMMUNITY): Payer: Medicare Other

## 2020-02-12 ENCOUNTER — Emergency Department (HOSPITAL_COMMUNITY)
Admission: EM | Admit: 2020-02-12 | Discharge: 2020-02-12 | Disposition: A | Discharge status: Home / Self Care | Payer: Medicare Other | Attending: Emergency Medicine | Admitting: Emergency Medicine

## 2020-02-12 DIAGNOSIS — R05 Cough: Secondary | ICD-10-CM | POA: Diagnosis not present

## 2020-02-12 DIAGNOSIS — S2241XA Multiple fractures of ribs, right side, initial encounter for closed fracture: Secondary | ICD-10-CM | POA: Diagnosis not present

## 2020-02-12 DIAGNOSIS — W06XXXA Fall from bed, initial encounter: Secondary | ICD-10-CM | POA: Insufficient documentation

## 2020-02-12 DIAGNOSIS — S299XXA Unspecified injury of thorax, initial encounter: Secondary | ICD-10-CM | POA: Diagnosis present

## 2020-02-12 DIAGNOSIS — Y9389 Activity, other specified: Secondary | ICD-10-CM | POA: Insufficient documentation

## 2020-02-12 DIAGNOSIS — F1020 Alcohol dependence, uncomplicated: Secondary | ICD-10-CM

## 2020-02-12 DIAGNOSIS — F17210 Nicotine dependence, cigarettes, uncomplicated: Secondary | ICD-10-CM | POA: Diagnosis not present

## 2020-02-12 DIAGNOSIS — Y929 Unspecified place or not applicable: Secondary | ICD-10-CM | POA: Diagnosis not present

## 2020-02-12 DIAGNOSIS — Y999 Unspecified external cause status: Secondary | ICD-10-CM | POA: Insufficient documentation

## 2020-02-12 DIAGNOSIS — R030 Elevated blood-pressure reading, without diagnosis of hypertension: Secondary | ICD-10-CM

## 2020-02-12 DIAGNOSIS — M549 Dorsalgia, unspecified: Secondary | ICD-10-CM | POA: Insufficient documentation

## 2020-02-12 DIAGNOSIS — E876 Hypokalemia: Secondary | ICD-10-CM | POA: Diagnosis not present

## 2020-02-12 HISTORY — DX: Essential (primary) hypertension: I10

## 2020-02-12 LAB — CBC
HCT: 47.5 % (ref 39.0–52.0)
Hemoglobin: 15.9 g/dL (ref 13.0–17.0)
MCH: 31.2 pg (ref 26.0–34.0)
MCHC: 33.5 g/dL (ref 30.0–36.0)
MCV: 93.3 fL (ref 80.0–100.0)
Platelets: 201 10*3/uL (ref 150–400)
RBC: 5.09 MIL/uL (ref 4.22–5.81)
RDW: 14.5 % (ref 11.5–15.5)
WBC: 7.6 10*3/uL (ref 4.0–10.5)
nRBC: 0 % (ref 0.0–0.2)

## 2020-02-12 LAB — COMPREHENSIVE METABOLIC PANEL
ALT: 22 U/L (ref 0–44)
AST: 27 U/L (ref 15–41)
Albumin: 3.1 g/dL — ABNORMAL LOW (ref 3.5–5.0)
Alkaline Phosphatase: 100 U/L (ref 38–126)
Anion gap: 14 (ref 5–15)
BUN: 6 mg/dL (ref 6–20)
CO2: 29 mmol/L (ref 22–32)
Calcium: 9.7 mg/dL (ref 8.9–10.3)
Chloride: 92 mmol/L — ABNORMAL LOW (ref 98–111)
Creatinine, Ser: 0.78 mg/dL (ref 0.61–1.24)
GFR calc Af Amer: >60 mL/min (ref >60)
GFR calc non Af Amer: >60 mL/min (ref >60)
Glucose, Bld: 113 mg/dL — ABNORMAL HIGH (ref 70–99)
Potassium: 3.4 mmol/L — ABNORMAL LOW (ref 3.5–5.1)
Sodium: 135 mmol/L (ref 135–145)
Total Bilirubin: 0.9 mg/dL (ref 0.3–1.2)
Total Protein: 7.6 g/dL (ref 6.5–8.1)

## 2020-02-12 LAB — ETHANOL: Alcohol, Ethyl (B): 46 mg/dL — ABNORMAL HIGH (ref <10)

## 2020-02-12 MED ORDER — TRAMADOL HCL 50 MG PO TABS: 50.0000 mg | ORAL_TABLET | Freq: Four times a day (QID) | ORAL | 0 refills | Status: DC | PRN

## 2020-02-12 MED ORDER — HYDROCODONE-ACETAMINOPHEN 5-325 MG PO TABS
2.0000 | ORAL_TABLET | Freq: Once | ORAL | Status: AC
  Administered 2020-02-12: 2 via ORAL
  Filled 2020-02-12: qty 2

## 2020-02-12 MED ORDER — POTASSIUM CHLORIDE CRYS ER 20 MEQ PO TBCR
40.0000 meq | EXTENDED_RELEASE_TABLET | Freq: Once | ORAL | Status: AC
  Administered 2020-02-12: 40 meq via ORAL
  Filled 2020-02-12: qty 2

## 2020-02-12 MED ORDER — IOHEXOL 300 MG/ML  SOLN
75.0000 mL | Freq: Once | INTRAMUSCULAR | Status: AC | PRN
  Administered 2020-02-12: 12:00:00 75 mL via INTRAVENOUS

## 2020-02-12 NOTE — ED Provider Notes (Signed)
[facility] EMERGENCY DEPARTMENT Provider Note   CSN: [id number] Arrival date & time: 02/12/20  0820     History Chief Complaint  Patient presents with  . Back Pain  . Chest Pain    NIKHIL OSEI is a 59 y.o. male.  Patient c/o fall from bed 4 days ago, and persistent right lateral/posterior rib pain since. Symptoms acute onset, moderate, persistent, worse w movement or change of position, sharp, non radiating. No sob. +non prod cough. No sore throat or runny nose. No known covid+ exposure. Denies abd pain or nv. No head injury or headache. No neck pain. No midline/spine pain. Denies extremity pain or injury. +hx etoh abuse. +smoker.   The history is provided by the patient.  Back Pain Associated symptoms: chest pain   Associated symptoms: no abdominal pain, no fever and no headaches   Chest Pain Associated symptoms: back pain and cough   Associated symptoms: no abdominal pain, no fever, no headache, no palpitations, no shortness of breath and no vomiting        Past Medical History:  Diagnosis Date  . Arthritis   . Asthma   . EtOH dependence (HCC)    drinks 2-3 40s a day  . Fibula fracture    Dr Dion Saucier 02-2011  . Hernia    surgery 01-17-13  . Tuberculosis     Patient Active Problem List   Diagnosis Date Noted  . Marijuana abuse 07/22/2017  . Hyponatremia 12/03/2015  . Left rib fracture 11/29/2015  . Microcytic anemia 11/29/2015  . Cigarette smoker 11/29/2015  . Protein-calorie malnutrition, severe (HCC) 11/29/2015  . Unintentional weight loss 11/29/2015  . History of tuberculosis 11/28/2015  . Cavitary lesion of lung 11/28/2015  . SBO (small bowel obstruction) (HCC) 03/10/2013  . Abdominal pain, diffuse 03/10/2013  . Nausea and vomiting 03/10/2013  . Fracture of fibula, proximal 01/21/2013  . Closed fracture of left distal distal tibia 01/21/2013  . EtOH dependence (HCC)   . Fibula fracture   . Hernia     Past Surgical History:    Procedure Laterality Date  . ANKLE SURGERY    . INGUINAL HERNIA REPAIR Right 01/19/2013   Procedure: HERNIA REPAIR INGUINAL ADULT;  Surgeon: [name], MD,FACS;  Location: [facility] OR;  Service: General;  Laterality: Right;  . TIBIA IM NAIL INSERTION Left 01/22/2013   Procedure: INTRAMEDULLARY (IM) NAIL TIBIAL;  Surgeon: [name], MD;  Location: [facility] OR;  Service: Orthopedics;  Laterality: Left;       Family History  Problem Relation Age of Onset  . Diabetes Mother   . Diabetes Father   . Hypertension Father     Social History   Tobacco Use  . Smoking status: Current Every Day Smoker    Packs/day: 0.50    Types: Cigarettes  . Smokeless tobacco: Never Used  . Tobacco comment: 1 ppd  Substance Use Topics  . Alcohol use: Yes    Alcohol/week: 14.0 standard drinks    Types: 14 Cans of beer per week    Comment: occasionally; generally drinks (2) 40s/day  . Drug use: No    Home Medications Prior to Admission medications   Medication Sig Start Date End Date Taking? Authorizing Provider  benzonatate (TESSALON) 200 MG capsule Take 1 capsule (200 mg total) by mouth 3 (three) times daily as needed for cough. 12/02/15   Kathlen Mody, MD  ferrous sulfate 325 (65 FE) MG tablet Take 1 tablet (325 mg total) by mouth  2 (two) times daily with a meal. 12/03/15   Renae Fickle, MD  fluticasone (FLONASE) 50 MCG/ACT nasal spray Place 1 spray into both nostrils daily. 07/28/15   Hedges, Tinnie Gens, PA-C  folic acid (FOLVITE) 1 MG tablet Take 1 tablet (1 mg total) by mouth daily. 12/02/15   Kathlen Mody, MD  ipratropium-albuterol (DUONEB) 0.5-2.5 (3) MG/3ML SOLN Take 3 mLs by nebulization every 6 (six) hours as needed. Patient taking differently: Take 3 mLs by nebulization every 6 (six) hours as needed (shortness of breath).  12/02/15   Kathlen Mody, MD  loratadine-pseudoephedrine (CLARITIN-D 12 HOUR) 5-120 MG per tablet Take 1 tablet by mouth 2 (two) times daily. 05/13/15   Lyndal Pulley, MD   methocarbamol (ROBAXIN) 500 MG tablet TAKE 1 TABLET TWICE DAILY AS NEEDED FOR MUSCLE SPASM 02/19/18   [provider]  Multiple Vitamin (MULTIVITAMIN WITH MINERALS) TABS tablet Take 1 tablet by mouth daily. 05/31/19   Horton, Mayer Masker, MD  SPIRIVA RESPIMAT 2.5 MCG/ACT AERS Inhale 2 sprays into the lungs daily. 11/24/15   [provider]  thiamine 100 MG tablet Take 1 tablet (100 mg total) by mouth daily. 05/31/19   Horton, Mayer Masker, MD  zolpidem (AMBIEN) 5 MG tablet Take 1 tablet (5 mg total) by mouth at bedtime as needed for sleep. 12/02/15   Kathlen Mody, MD    Allergies    Patient has no known allergies.  Review of Systems   Review of Systems  Constitutional: Negative for fever.  HENT: Negative for sore throat.   Eyes: Negative for redness.  Respiratory: Positive for cough. Negative for shortness of breath.   Cardiovascular: Positive for chest pain. Negative for palpitations and leg swelling.  Gastrointestinal: Negative for abdominal pain and vomiting.  Genitourinary: Negative for flank pain.  Musculoskeletal: Positive for back pain. Negative for neck pain.  Skin: Negative for rash and wound.  Neurological: Negative for syncope and headaches.  Hematological: Does not bruise/bleed easily.  Psychiatric/Behavioral: Negative for confusion.    Physical Exam Updated Vital Signs BP (!) 163/102   Pulse 87   Temp 98.9 F (37.2 C) (Oral)   Resp 16   Ht 1.854 m (6\' 1" )   Wt 74.8 kg   SpO2 92%   BMI 21.77 kg/m   Physical Exam Vitals and nursing note reviewed.  Constitutional:      Appearance: Normal appearance. He is well-developed.  HENT:     Head: Atraumatic.     Nose: Nose normal.     Mouth/Throat:     Mouth: Mucous membranes are moist.     Pharynx: Oropharynx is clear.  Eyes:     General: No scleral icterus.    Conjunctiva/sclera: Conjunctivae normal.     Pupils: Pupils are equal, round, and reactive to light.  Neck:     Trachea: No tracheal  deviation.  Cardiovascular:     Rate and Rhythm: Regular rhythm. Tachycardia present.     Pulses: Normal pulses.     Heart sounds: Normal heart sounds. No murmur. No friction rub. No gallop.   Pulmonary:     Effort: Pulmonary effort is normal. No accessory muscle usage or respiratory distress.     Breath sounds: Normal breath sounds.     Comments: Right posterior/lateral rib/chest wall tenderness. No crepitus. Normal chest wall movement.  Chest:     Chest wall: Tenderness present.  Abdominal:     General: Bowel sounds are normal. There is no distension.     Palpations: Abdomen is  soft.     Tenderness: There is no abdominal tenderness. There is no guarding.     Comments: No abd wall contusion or bruising.   Genitourinary:    Comments: No cva tenderness. Musculoskeletal:        General: No swelling.     Cervical back: Normal range of motion and neck supple. No rigidity.     Comments: CTLS spine, non tender, aligned, no step off. Good rom bil extremities without pain or focal bony tenderness.   Skin:    General: Skin is warm and dry.     Findings: No rash.  Neurological:     Mental Status: He is alert.     Comments: Alert, speech clear. Motor/sens grossly intact bil.  Psychiatric:        Mood and Affect: Mood normal.     ED Results / Procedures / Treatments   Labs (all labs ordered are listed, but only abnormal results are displayed) Results for orders placed or performed during the hospital encounter of 02/12/20  CBC  Result Value Ref Range   WBC 7.6 4.0 - 10.5 K/uL   RBC 5.09 4.22 - 5.81 MIL/uL   Hemoglobin 15.9 13.0 - 17.0 g/dL   HCT 40.947.5 81.139.0 - 91.452.0 %   MCV 93.3 80.0 - 100.0 fL   MCH 31.2 26.0 - 34.0 pg   MCHC 33.5 30.0 - 36.0 g/dL   RDW 78.214.5 95.611.5 - 21.315.5 %   Platelets 201 150 - 400 K/uL   nRBC 0.0 0.0 - 0.2 %  Comprehensive metabolic panel  Result Value Ref Range   Sodium 135 135 - 145 mmol/L   Potassium 3.4 (L) 3.5 - 5.1 mmol/L   Chloride 92 (L) 98 - 111 mmol/L    CO2 29 22 - 32 mmol/L   Glucose, Bld 113 (H) 70 - 99 mg/dL   BUN 6 6 - 20 mg/dL   Creatinine, Ser 0.860.78 0.61 - 1.24 mg/dL   Calcium 9.7 8.9 - 57.810.3 mg/dL   Total Protein 7.6 6.5 - 8.1 g/dL   Albumin 3.1 (L) 3.5 - 5.0 g/dL   AST 27 15 - 41 U/L   ALT 22 0 - 44 U/L   Alkaline Phosphatase 100 38 - 126 U/L   Total Bilirubin 0.9 0.3 - 1.2 mg/dL   GFR calc non Af Amer >60 >60 mL/min   GFR calc Af Amer >60 >60 mL/min   Anion gap 14 5 - 15  Ethanol  Result Value Ref Range   Alcohol, Ethyl (B) 46 (H) <10 mg/dL   DG Ribs Unilateral W/Chest Right  Result Date: 02/12/2020 CLINICAL DATA:  Pain following fall EXAM: RIGHT RIBS AND CHEST - 3+ VIEW COMPARISON:  Chest radiograph May 31, 2019 FINDINGS: Frontal chest as well as oblique and cone-down rib images obtained. There is scarring in the right upper lobe with volume loss and bullous disease in the right upper lobe/apex region. Scarring is also noted in the right lower lung zone, stable. There are scattered calcified granulomas bilaterally with scarring in the left perihilar region, stable. Scarring in the left apex is also stable. No new opacity evident. The heart size is normal. Distortion of pulmonary vascularity on the right is stable due to scarring. Pulmonary vascularity on the left appears unremarkable. No adenopathy is demonstrable. 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 No appreciable pneumothorax or pleural effusion. There is an expansile appearing lesion in the left anterolateral tenth rib measuring 3.0 x 2.9 cm. Foci of  prior rib trauma with healing response noted. There are acute appearing fractures of the anterior right seventh and eighth ribs with alignment essentially anatomic in these areas. Postoperative change noted in the right proximal humerus. IMPRESSION: 1. Acute appearing essentially nondisplaced fractures of the anterior right seventh and eighth ribs. No pneumothorax. Evidence of old rib fractures bilaterally with  healing. 2. Apparent expansile lesion in the anterior left tenth rib, not seen previously. This area of apparent expansile lesion measures 3 x 2.9 cm. Neoplastic involvement in this area must be of concern. Advise correlation with contrast enhanced chest CT to further assess this area. 3. Extensive areas of lung scarring and ill-defined opacity, stable. Underlying atypical organism pneumonia such as Mycobacterium avium-intracellulare could present in this manner. Apparent underlying granulomatous disease as well. 4.  Stable cardiac silhouette. 5.  Prior nonunited lateral right clavicle fracture. Electronically Signed   By: Lowella Grip III M.D.   On: 02/12/2020 09:40    EKG EKG Interpretation  Date/Time:  Tuesday February 12 2020 08:28:19 EST Ventricular Rate:  107 PR Interval:    QRS Duration: 87 QT Interval:  339 QTC Calculation: 453 R Axis:   66 Text Interpretation: Sinus tachycardia Nonspecific ST abnormality No significant change since last tracing Confirmed by Lajean Saver [phone]) on 02/12/2020 8:33:37 AM   Radiology DG Ribs Unilateral W/Chest Right  Result Date: 02/12/2020 CLINICAL DATA:  Pain following fall EXAM: RIGHT RIBS AND CHEST - 3+ VIEW COMPARISON:  Chest radiograph May 31, 2019 FINDINGS: Frontal chest as well as oblique and cone-down rib images obtained. There is scarring in the right upper lobe with volume loss and bullous disease in the right upper lobe/apex region. Scarring is also noted in the right lower lung zone, stable. There are scattered calcified granulomas bilaterally with scarring in the left perihilar region, stable. Scarring in the left apex is also stable. No new opacity evident. The heart size is normal. Distortion of pulmonary vascularity on the right is stable due to scarring. Pulmonary vascularity on the left appears unremarkable. No adenopathy is demonstrable. 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 No appreciable pneumothorax or pleural  effusion. There is an expansile appearing lesion in the left anterolateral tenth rib measuring 3.0 x 2.9 cm. Foci of prior rib trauma with healing response noted. There are acute appearing fractures of the anterior right seventh and eighth ribs with alignment essentially anatomic in these areas. Postoperative change noted in the right proximal humerus. IMPRESSION: 1. Acute appearing essentially nondisplaced fractures of the anterior right seventh and eighth ribs. No pneumothorax. Evidence of old rib fractures bilaterally with healing. 2. Apparent expansile lesion in the anterior left tenth rib, not seen previously. This area of apparent expansile lesion measures 3 x 2.9 cm. Neoplastic involvement in this area must be of concern. Advise correlation with contrast enhanced chest CT to further assess this area. 3. Extensive areas of lung scarring and ill-defined opacity, stable. Underlying atypical organism pneumonia such as Mycobacterium avium-intracellulare could present in this manner. Apparent underlying granulomatous disease as well. 4.  Stable cardiac silhouette. 5.  Prior nonunited lateral right clavicle fracture. Electronically Signed   By: Lowella Grip III M.D.   On: 02/12/2020 09:40   CT Chest W Contrast  Result Date: 02/12/2020 CLINICAL DATA:  Right-sided chest wall pain, fell, expansile lesion seen on rib x-ray EXAM: CT CHEST WITH CONTRAST TECHNIQUE: Multidetector CT imaging of the chest was performed during intravenous contrast administration. CONTRAST:  77mL OMNIPAQUE IOHEXOL 300 MG/ML  SOLN COMPARISON:  02/12/2020, 11/29/2015 FINDINGS: Cardiovascular: The heart is unremarkable without pericardial effusion. Thoracic aorta is normal in caliber without aneurysm or dissection. 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 Mediastinum/Nodes: No enlarged mediastinal, hilar, or  axillary lymph nodes. Thyroid gland, trachea, and esophagus demonstrate no significant findings. Lungs/Pleura: There is chronic cavitation involving the majority of the right upper lobe with associated right-sided volume loss, stable. Numerous bilateral parenchymal lung nodules are seen, overall decreased in size since prior study. No additional areas of cavitation are identified on this exam. Given the diffuse findings as above, as well as the persistence over time, chronic indolent infection such as mycobacterium or fungal is suspected. No new airspace disease, effusion, or pneumothorax. The central airways are grossly patent. Upper Abdomen: There is diffuse fatty infiltration of the liver. Musculoskeletal: There are nondisplaced acute right anterolateral seventh and eighth rib fractures, corresponding to the rib x-ray evaluation. There are numerous other chronic bilateral rib fractures. Specifically, subacute to chronic left posterior tenth and eleventh rib fractures demonstrates significant callus formation and likely account for the appearance on corresponding rib series. There is no expansile or destructive rib lesion. IMPRESSION: 1. Acute nondisplaced right anterolateral seventh and eighth rib fractures. 2. Subacute to chronic left posterior tenth and eleventh rib fractures, likely accounting for the appearance on corresponding rib series. 3. 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 Given the persistence over time, chronic indolent infection such as Mycobacterium or fungus is suspected. 4. Diffuse fatty infiltration of the liver. Electronically Signed   By: Sharlet Salina M.D.   On: 02/12/2020 13:58    Procedures Procedures (including critical care time)  Medications Ordered in ED Medications - No data to display  ED Course  I have reviewed the triage vital signs and the nursing  notes.  Pertinent labs & imaging results that were available during my care of the patient were reviewed by me and considered in my medical decision making (see chart for details).    MDM Rules/Calculators/A&P                      Labs sent. Imaging ordered. Ecg.   Reviewed nursing notes and prior charts for additional history.   Labs reviewed/interpreted by me - etoh elev. Wbc normal.  CXR reviewed/interpreed by me - + rib fractures. Left 10th rib lesion - radiology recommends CT - CT ordered. Discussed results w pt.   Hydrocodone po for pain.   Recheck pt, pain improved.  Additional labs reviewed/interpreted by me - k low. kcl po.   CT reviewed/interpreted by me - ribs fxs, chronic changes also noted - discussed w pt, remote hx tb - no fever, or increased cough. Pt to f/u pcp.  Return precautions provided.       Final Clinical Impression(s) / ED Diagnoses Final diagnoses:  None    Rx / DC Orders ED Discharge Orders    None       Cathren Laine, MD 02/12/20 1425

## 2020-02-12 NOTE — ED Notes (Signed)
Patient transported to X-ray 

## 2020-02-12 NOTE — ED Triage Notes (Signed)
Per [facility] pt coming for right sided chest/ rib cage pain. States he fell while walking and hurt right side. 324 aspirin given PTA.

## 2020-02-12 NOTE — Discharge Instructions (Addendum)
It was our pleasure to provide your ER care today - we hope that you feel better.  Your ct scan was read as showing:  IMPRESSION: 1. Acute nondisplaced right anterolateral seventh and eighth rib  fractures. 2. Subacute to chronic left posterior tenth and eleventh rib fractures, likely accounting for the appearance on corresponding rib series. 3. 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 Given the persistence over time, chronic indolent infection such as Mycobacterium or fungus is suspected.   Follow up with primary care doctor in the next 1-2 weeks and discuss above findings with them. Also have your blood pressure rechecked then, as it is high today - see hypertension instructions, and limit salt intake as well.     Your potassium level is low - eat plenty of fruits and vegetables, and follow up with primary care doctor in 1-2 weeks.   You may take ultram as need for pain - no driving for the next 6 hours, or if taking ultram.   Avoid alcohol use - use resource guide provided for community treatment options.   Return to ER if worse, new symptoms, increased trouble breathing, fevers, weak/fainting, or other concern.

## 2020-03-31 ENCOUNTER — Emergency Department (HOSPITAL_COMMUNITY)
Admission: EM | Admit: 2020-03-31 | Discharge: 2020-04-01 | Disposition: A | Discharge status: Home / Self Care | Payer: Medicare Other | Attending: Emergency Medicine | Admitting: Emergency Medicine

## 2020-03-31 ENCOUNTER — Encounter (HOSPITAL_COMMUNITY): Payer: Self-pay

## 2020-03-31 ENCOUNTER — Other Ambulatory Visit: Payer: Self-pay

## 2020-03-31 DIAGNOSIS — F10929 Alcohol use, unspecified with intoxication, unspecified: Secondary | ICD-10-CM | POA: Insufficient documentation

## 2020-03-31 DIAGNOSIS — Z5321 Procedure and treatment not carried out due to patient leaving prior to being seen by health care provider: Secondary | ICD-10-CM | POA: Insufficient documentation

## 2020-03-31 LAB — CBC
HCT: 43.2 % (ref 39.0–52.0)
Hemoglobin: 14.5 g/dL (ref 13.0–17.0)
MCH: 31.3 pg (ref 26.0–34.0)
MCHC: 33.6 g/dL (ref 30.0–36.0)
MCV: 93.3 fL (ref 80.0–100.0)
Platelets: 261 10*3/uL (ref 150–400)
RBC: 4.63 MIL/uL (ref 4.22–5.81)
RDW: 15.4 % (ref 11.5–15.5)
WBC: 4.9 10*3/uL (ref 4.0–10.5)
nRBC: 0 % (ref 0.0–0.2)
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
## 2020-04-01 LAB — COMPREHENSIVE METABOLIC PANEL
ALT: 37 U/L (ref 0–44)
AST: 49 U/L — ABNORMAL HIGH (ref 15–41)
Albumin: 3.1 g/dL — ABNORMAL LOW (ref 3.5–5.0)
Alkaline Phosphatase: 105 U/L (ref 38–126)
Anion gap: 14 (ref 5–15)
BUN: <5 mg/dL — ABNORMAL LOW (ref 6–20)
CO2: 22 mmol/L (ref 22–32)
Calcium: 9.2 mg/dL (ref 8.9–10.3)
Chloride: 100 mmol/L (ref 98–111)
Creatinine, Ser: 0.7 mg/dL (ref 0.61–1.24)
GFR calc Af Amer: >60 mL/min (ref >60)
GFR calc non Af Amer: >60 mL/min (ref >60)
Glucose, Bld: 90 mg/dL (ref 70–99)
Potassium: 3.9 mmol/L (ref 3.5–5.1)
Sodium: 136 mmol/L (ref 135–145)
Total Bilirubin: 0.7 mg/dL (ref 0.3–1.2)
Total Protein: 8 g/dL (ref 6.5–8.1)

## 2020-04-01 LAB — ETHANOL: Alcohol, Ethyl (B): 382 mg/dL (ref <10)

## 2020-04-01 LAB — RAPID URINE DRUG SCREEN, HOSP PERFORMED
Amphetamines: NOT DETECTED
Barbiturates: NOT DETECTED
Benzodiazepines: NOT DETECTED
Cocaine: NOT DETECTED
Opiates: NOT DETECTED
Tetrahydrocannabinol: POSITIVE — AB

## 2020-04-16 ENCOUNTER — Emergency Department (HOSPITAL_COMMUNITY)
Admission: EM | Admit: 2020-04-16 | Discharge: 2020-04-17 | Disposition: A | Discharge status: Home / Self Care | Payer: Medicare Other | Source: Home / Self Care | Attending: Emergency Medicine | Admitting: Emergency Medicine

## 2020-04-16 ENCOUNTER — Other Ambulatory Visit: Payer: Self-pay

## 2020-04-16 ENCOUNTER — Emergency Department (HOSPITAL_COMMUNITY): Payer: Medicare Other

## 2020-04-16 ENCOUNTER — Encounter (HOSPITAL_COMMUNITY): Payer: Self-pay

## 2020-04-16 ENCOUNTER — Emergency Department (HOSPITAL_COMMUNITY)
Admission: EM | Admit: 2020-04-16 | Discharge: 2020-04-16 | Disposition: A | Discharge status: Home / Self Care | Payer: Medicare Other | Attending: Emergency Medicine | Admitting: Emergency Medicine

## 2020-04-16 DIAGNOSIS — Y92239 Unspecified place in hospital as the place of occurrence of the external cause: Secondary | ICD-10-CM | POA: Insufficient documentation

## 2020-04-16 DIAGNOSIS — M25552 Pain in left hip: Secondary | ICD-10-CM

## 2020-04-16 DIAGNOSIS — F1020 Alcohol dependence, uncomplicated: Secondary | ICD-10-CM | POA: Insufficient documentation

## 2020-04-16 DIAGNOSIS — Z79899 Other long term (current) drug therapy: Secondary | ICD-10-CM | POA: Insufficient documentation

## 2020-04-16 DIAGNOSIS — Y900 Blood alcohol level of less than 20 mg/100 ml: Secondary | ICD-10-CM | POA: Diagnosis not present

## 2020-04-16 DIAGNOSIS — J45909 Unspecified asthma, uncomplicated: Secondary | ICD-10-CM | POA: Insufficient documentation

## 2020-04-16 DIAGNOSIS — Y9301 Activity, walking, marching and hiking: Secondary | ICD-10-CM | POA: Insufficient documentation

## 2020-04-16 DIAGNOSIS — I10 Essential (primary) hypertension: Secondary | ICD-10-CM | POA: Insufficient documentation

## 2020-04-16 DIAGNOSIS — S2241XA Multiple fractures of ribs, right side, initial encounter for closed fracture: Secondary | ICD-10-CM | POA: Insufficient documentation

## 2020-04-16 DIAGNOSIS — Y929 Unspecified place or not applicable: Secondary | ICD-10-CM | POA: Diagnosis not present

## 2020-04-16 DIAGNOSIS — F1010 Alcohol abuse, uncomplicated: Secondary | ICD-10-CM

## 2020-04-16 DIAGNOSIS — Z0489 Encounter for examination and observation for other specified reasons: Secondary | ICD-10-CM | POA: Diagnosis present

## 2020-04-16 DIAGNOSIS — Y939 Activity, unspecified: Secondary | ICD-10-CM | POA: Insufficient documentation

## 2020-04-16 DIAGNOSIS — Z20822 Contact with and (suspected) exposure to covid-19: Secondary | ICD-10-CM | POA: Diagnosis not present

## 2020-04-16 DIAGNOSIS — W19XXXA Unspecified fall, initial encounter: Secondary | ICD-10-CM

## 2020-04-16 DIAGNOSIS — X58XXXA Exposure to other specified factors, initial encounter: Secondary | ICD-10-CM | POA: Diagnosis not present

## 2020-04-16 DIAGNOSIS — Y999 Unspecified external cause status: Secondary | ICD-10-CM | POA: Insufficient documentation

## 2020-04-16 DIAGNOSIS — W1830XA Fall on same level, unspecified, initial encounter: Secondary | ICD-10-CM | POA: Insufficient documentation

## 2020-04-16 LAB — RAPID URINE DRUG SCREEN, HOSP PERFORMED
Amphetamines: NOT DETECTED
Barbiturates: NOT DETECTED
Benzodiazepines: NOT DETECTED
Cocaine: NOT DETECTED
Opiates: NOT DETECTED
Tetrahydrocannabinol: POSITIVE — AB

## 2020-04-16 LAB — COMPREHENSIVE METABOLIC PANEL
ALT: 47 U/L — ABNORMAL HIGH (ref 0–44)
AST: 63 U/L — ABNORMAL HIGH (ref 15–41)
Albumin: 3.3 g/dL — ABNORMAL LOW (ref 3.5–5.0)
Alkaline Phosphatase: 111 U/L (ref 38–126)
Anion gap: 16 — ABNORMAL HIGH (ref 5–15)
BUN: 8 mg/dL (ref 6–20)
CO2: 23 mmol/L (ref 22–32)
Calcium: 9.6 mg/dL (ref 8.9–10.3)
Chloride: 97 mmol/L — ABNORMAL LOW (ref 98–111)
Creatinine, Ser: 0.83 mg/dL (ref 0.61–1.24)
GFR calc Af Amer: >60 mL/min (ref >60)
GFR calc non Af Amer: >60 mL/min (ref >60)
Glucose, Bld: 71 mg/dL (ref 70–99)
Potassium: 4.6 mmol/L (ref 3.5–5.1)
Sodium: 136 mmol/L (ref 135–145)
Total Bilirubin: 1.7 mg/dL — ABNORMAL HIGH (ref 0.3–1.2)
Total Protein: 8.4 g/dL — ABNORMAL HIGH (ref 6.5–8.1)

## 2020-04-16 LAB — CBC
HCT: 44 % (ref 39.0–52.0)
Hemoglobin: 14.3 g/dL (ref 13.0–17.0)
MCH: 31.5 pg (ref 26.0–34.0)
MCHC: 32.5 g/dL (ref 30.0–36.0)
MCV: 96.9 fL (ref 80.0–100.0)
Platelets: 135 10*3/uL — ABNORMAL LOW (ref 150–400)
RBC: 4.54 MIL/uL (ref 4.22–5.81)
RDW: 15.4 % (ref 11.5–15.5)
WBC: 5.6 10*3/uL (ref 4.0–10.5)
nRBC: 0 % (ref 0.0–0.2)

## 2020-04-16 LAB — CBG MONITORING, ED
Glucose-Capillary: 56 mg/dL — ABNORMAL LOW (ref 70–99)
Glucose-Capillary: 87 mg/dL (ref 70–99)

## 2020-04-16 LAB — SARS CORONAVIRUS 2 BY RT PCR (HOSPITAL ORDER, PERFORMED IN ~~LOC~~ HOSPITAL LAB): SARS Coronavirus 2: NEGATIVE

## 2020-04-16 LAB — MAGNESIUM: Magnesium: 1.5 mg/dL — ABNORMAL LOW (ref 1.7–2.4)

## 2020-04-16 LAB — ETHANOL: Alcohol, Ethyl (B): 10 mg/dL — ABNORMAL HIGH (ref <10)

## 2020-04-16 MED ORDER — THIAMINE HCL 100 MG/ML IJ SOLN: 100.0000 mg | Freq: Every day | INTRAMUSCULAR | Status: DC

## 2020-04-16 MED ORDER — NICOTINE 21 MG/24HR TD PT24
21.0000 mg | MEDICATED_PATCH | Freq: Every day | TRANSDERMAL | Status: DC
  Filled 2020-04-16: qty 1

## 2020-04-16 MED ORDER — ALUM & MAG HYDROXIDE-SIMETH 200-200-20 MG/5ML PO SUSP: 30.0000 mL | Freq: Four times a day (QID) | ORAL | Status: DC | PRN

## 2020-04-16 MED ORDER — LORAZEPAM 1 MG PO TABS: 0.0000 mg | ORAL_TABLET | Freq: Four times a day (QID) | ORAL | Status: DC

## 2020-04-16 MED ORDER — MAGNESIUM SULFATE 2 GM/50ML IV SOLN
2.0000 g | Freq: Once | INTRAVENOUS | Status: AC
  Administered 2020-04-16: 2 g via INTRAVENOUS
  Filled 2020-04-16: qty 50

## 2020-04-16 MED ORDER — THIAMINE HCL 100 MG PO TABS
100.0000 mg | ORAL_TABLET | Freq: Every day | ORAL | Status: DC
  Administered 2020-04-16: 100 mg via ORAL
  Filled 2020-04-16: qty 1

## 2020-04-16 MED ORDER — SODIUM CHLORIDE 0.9 % IV BOLUS
1000.0000 mL | Freq: Once | INTRAVENOUS | Status: AC
  Administered 2020-04-16: 1000 mL via INTRAVENOUS

## 2020-04-16 MED ORDER — ONDANSETRON HCL 4 MG PO TABS: 4.0000 mg | ORAL_TABLET | Freq: Three times a day (TID) | ORAL | Status: DC | PRN

## 2020-04-16 MED ORDER — LORAZEPAM 2 MG/ML IJ SOLN
1.0000 mg | Freq: Once | INTRAMUSCULAR | Status: AC
  Administered 2020-04-16: 1 mg via INTRAVENOUS
  Filled 2020-04-16: qty 1

## 2020-04-16 MED ORDER — LORAZEPAM 2 MG/ML IJ SOLN: 0.0000 mg | Freq: Four times a day (QID) | INTRAMUSCULAR | Status: DC

## 2020-04-16 MED ORDER — LORAZEPAM 1 MG PO TABS: 0.0000 mg | ORAL_TABLET | Freq: Two times a day (BID) | ORAL | Status: DC

## 2020-04-16 MED ORDER — LORAZEPAM 2 MG/ML IJ SOLN: 0.0000 mg | Freq: Two times a day (BID) | INTRAMUSCULAR | Status: DC

## 2020-04-16 NOTE — Discharge Instructions (Addendum)
Make sure you are eating a regular diet, 3 times a day.  Avoid drinking alcohol.  Follow-up with your primary care doctor for further care and treatment of your chronic lung disease.  You have several broken ribs on the right side.  Use Tylenol as needed for pain.

## 2020-04-16 NOTE — ED Triage Notes (Signed)
Pt bib ems with reports of trying to detox from etoh. Had a 40 ounce beer yesterday around 1700. Normally drinks 2 40 ounce beers daily. gcs 15.  170/92 HR 92 96% RA CBG 56 T 97.19F
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
## 2020-04-16 NOTE — ED Notes (Signed)
TTS in progress 
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
## 2020-04-16 NOTE — ED Provider Notes (Signed)
[facility] EMERGENCY DEPARTMENT Provider Note   CSN: [id number] Arrival date & time: 04/16/20  1255     History Chief Complaint  Patient presents with  . Withdrawal    Frederick [name] is a 59 y.o. male.  Pt presents to the ED today wanting EtOH detox.  Pt has a long hx of alcohol abuse.  He said he decided to quit because his wife told him to quit.  He usually drinks 2-3 40 oz beers per day.  He last had a drink around 1700 last night.  Pt has had some pain to the right side of his chest for about 3 weeks.  The pt denies any hx of seizures or hallucinations.           Past Medical History:  Diagnosis Date  . Arthritis   . Asthma   . EtOH dependence (HCC)    drinks 2-3 40s a day  . Fibula fracture    Dr Mardelle Matte 02-2011  . Hernia    surgery 01-17-13  . Hypertension   . Tuberculosis     Patient Active Problem List   Diagnosis Date Noted  . Marijuana abuse 07/22/2017  . Hyponatremia 12/03/2015  . Left rib fracture 11/29/2015  . Microcytic anemia 11/29/2015  . Cigarette smoker 11/29/2015  . Protein-calorie malnutrition, severe ([place]) 11/29/2015  . Unintentional weight loss 11/29/2015  . History of tuberculosis 11/28/2015  . Cavitary lesion of lung 11/28/2015  . SBO (small bowel obstruction) ([place]) 03/10/2013  . Abdominal pain, diffuse 03/10/2013  . Nausea and vomiting 03/10/2013  . Fracture of fibula, proximal 01/21/2013  . Closed fracture of left distal distal tibia 01/21/2013  . EtOH dependence ([place])   . Fibula fracture   . Hernia     Past Surgical History:  Procedure Laterality Date  . ANKLE SURGERY    . INGUINAL HERNIA REPAIR Right 01/19/2013   Procedure: HERNIA REPAIR INGUINAL ADULT;  Surgeon: Gayland Curry, MD,FACS;  Location: [facility];  Service: General;  Laterality: Right;  . TIBIA IM NAIL INSERTION Left 01/22/2013   Procedure: INTRAMEDULLARY (IM) NAIL TIBIAL;  Surgeon: Johnny Bridge, MD;  Location: [facility];  Service: Orthopedics;   Laterality: Left;       Family History  Problem Relation Age of Onset  . Diabetes Mother   . Diabetes Father   . Hypertension Father     Social History   Tobacco Use  . Smoking status: Current Every Day Smoker    Packs/day: 0.50    Types: Cigarettes  . Smokeless tobacco: Never Used  . Tobacco comment: 1 ppd  Substance Use Topics  . Alcohol use: Yes    Alcohol/week: 14.0 standard drinks    Types: 14 Cans of beer per week    Comment: occasionally; generally drinks (2) 40s/day  . Drug use: No    Home Medications Prior to Admission medications   Medication Sig Start Date End Date Taking? Authorizing Provider  albuterol (VENTOLIN HFA) 108 (90 Base) MCG/ACT inhaler Inhale 2 puffs into the lungs 4 (four) times daily as needed for shortness of breath. 12/06/19   [provider]  benzonatate (TESSALON) 200 MG capsule Take 1 capsule (200 mg total) by mouth 3 (three) times daily as needed for cough. Patient not taking: Reported on 02/12/2020 12/02/15   Hosie Poisson, MD  ferrous sulfate 325 (65 FE) MG tablet Take 1 tablet (325 mg total) by mouth 2 (two) times daily with a meal. Patient not taking: Reported  on 02/12/2020 12/03/15   Renae Fickle, MD  fluticasone (FLONASE) 50 MCG/ACT nasal spray Place 1 spray into both nostrils daily. Patient taking differently: Place 1 spray into both nostrils daily as needed for allergies.  07/28/15   Hedges, Tinnie Gens, PA-C  folic acid (FOLVITE) 1 MG tablet Take 1 tablet (1 mg total) by mouth daily. Patient not taking: Reported on 02/12/2020 12/02/15   Kathlen Mody, MD  ipratropium-albuterol (DUONEB) 0.5-2.5 (3) MG/3ML SOLN Take 3 mLs by nebulization every 6 (six) hours as needed. Patient not taking: Reported on 02/12/2020 12/02/15   Kathlen Mody, MD  loratadine-pseudoephedrine (CLARITIN-D 12 HOUR) 5-120 MG per tablet Take 1 tablet by mouth 2 (two) times daily. Patient not taking: Reported on 02/12/2020 05/13/15   Lyndal Pulley, MD  Multiple Vitamin  (MULTIVITAMIN WITH MINERALS) TABS tablet Take 1 tablet by mouth daily. 05/31/19   Horton, Mayer Masker, MD  SPIRIVA RESPIMAT 2.5 MCG/ACT AERS Inhale 2 sprays into the lungs daily. 11/24/15   [provider]  thiamine 100 MG tablet Take 1 tablet (100 mg total) by mouth daily. Patient not taking: Reported on 02/12/2020 05/31/19   Horton, Mayer Masker, MD  traMADol (ULTRAM) 50 MG tablet Take 1 tablet (50 mg total) by mouth every 6 (six) hours as needed. 02/12/20   Cathren Laine, MD  zolpidem (AMBIEN) 5 MG tablet Take 1 tablet (5 mg total) by mouth at bedtime as needed for sleep. Patient not taking: Reported on 02/12/2020 12/02/15   Kathlen Mody, MD    Allergies    Patient has no known allergies.  Review of Systems   Review of Systems  All other systems reviewed and are negative.   Physical Exam Updated Vital Signs BP (!) 158/96   Pulse 100   Temp 99 F (37.2 C) (Oral)   Resp 18   SpO2 (!) 78%   Physical Exam Vitals and nursing note reviewed.  Constitutional:      Appearance: Normal appearance.  HENT:     Head: Normocephalic and atraumatic.     Right Ear: External ear normal.     Left Ear: External ear normal.     Nose: Nose normal.     Mouth/Throat:     Mouth: Mucous membranes are moist.     Pharynx: Oropharynx is clear.  Eyes:     Extraocular Movements: Extraocular movements intact.     Conjunctiva/sclera: Conjunctivae normal.     Pupils: Pupils are equal, round, and reactive to light.  Cardiovascular:     Rate and Rhythm: Normal rate and regular rhythm.     Pulses: Normal pulses.     Heart sounds: Normal heart sounds.  Pulmonary:     Effort: Pulmonary effort is normal.     Breath sounds: Normal breath sounds.  Abdominal:     General: Abdomen is flat. Bowel sounds are normal.     Palpations: Abdomen is soft.  Musculoskeletal:     Cervical back: Normal range of motion and neck supple.  Skin:    General: Skin is warm.     Capillary Refill: Capillary refill takes less  than 2 seconds.  Neurological:     General: No focal deficit present.     Mental Status: He is alert and oriented to person, place, and time.     Motor: Tremor present.  Psychiatric:        Mood and Affect: Mood normal.        Behavior: Behavior normal.        Thought  Content: Thought content normal.        Judgment: Judgment normal.     ED Results / Procedures / Treatments   Labs (all labs ordered are listed, but only abnormal results are displayed) Labs Reviewed  COMPREHENSIVE METABOLIC PANEL - Abnormal; Notable for the following components:      Result Value   Chloride 97 (*)    Total Protein 8.4 (*)    Albumin 3.3 (*)    AST 63 (*)    ALT 47 (*)    Total Bilirubin 1.7 (*)    Anion gap 16 (*)    All other components within normal limits  ETHANOL - Abnormal; Notable for the following components:   Alcohol, Ethyl (B) 10 (*)    All other components within normal limits  CBC - Abnormal; Notable for the following components:   Platelets 135 (*)    All other components within normal limits  RAPID URINE DRUG SCREEN, HOSP PERFORMED - Abnormal; Notable for the following components:   Tetrahydrocannabinol POSITIVE (*)    All other components within normal limits  MAGNESIUM - Abnormal; Notable for the following components:   Magnesium 1.5 (*)    All other components within normal limits  CBG MONITORING, ED - Abnormal; Notable for the following components:   Glucose-Capillary 56 (*)    All other components within normal limits  SARS CORONAVIRUS 2 BY RT PCR (HOSPITAL ORDER, PERFORMED IN [facility])  CBG MONITORING, ED    EKG EKG Interpretation  Date/Time:  Wednesday Apr 16 2020 16:26:02 EDT Ventricular Rate:  98 PR Interval:    QRS Duration: 86 QT Interval:  371 QTC Calculation: 474 R Axis:   68 Text Interpretation: Sinus rhythm Consider right atrial enlargement No significant change since last tracing Confirmed by Jacalyn Lefevre [id number]) on 04/16/2020  4:41:39 PM   Radiology No results found.  Procedures Procedures (including critical care time)  Medications Ordered in ED Medications  LORazepam (ATIVAN) injection 1 mg (has no administration in time range)  magnesium sulfate IVPB 2 g 50 mL (has no administration in time range)  LORazepam (ATIVAN) injection 0-4 mg (has no administration in time range)    Or  LORazepam (ATIVAN) tablet 0-4 mg (has no administration in time range)  LORazepam (ATIVAN) injection 0-4 mg (has no administration in time range)    Or  LORazepam (ATIVAN) tablet 0-4 mg (has no administration in time range)  thiamine tablet 100 mg (has no administration in time range)    Or  thiamine (B-1) injection 100 mg (has no administration in time range)  nicotine (NICODERM CQ - dosed in mg/24 hours) patch 21 mg (has no administration in time range)  ondansetron (ZOFRAN) tablet 4 mg (has no administration in time range)  alum & mag hydroxide-simeth (MAALOX/MYLANTA) 200-200-20 MG/5ML suspension 30 mL (has no administration in time range)  sodium chloride 0.9 % bolus 1,000 mL (1,000 mLs Intravenous New Bag/Given 04/16/20 1626)    ED Course  I have reviewed the triage vital signs and the nursing notes.  Pertinent labs & imaging results that were available during my care of the patient were reviewed by me and considered in my medical decision making (see chart for details).    MDM Rules/Calculators/A&P                       Mg low.  2 g given to pt.  CIWA protocol ordered with ativan.  Pt's CP has been  going on for 3 weeks.  It's very atypical.  EKG ok.  CXR pending.  If TTS does not have an inpatient place for him, he can go home with librium.  Final Clinical Impression(s) / ED Diagnoses Final diagnoses:  Alcohol abuse  Hypomagnesemia    Rx / DC Orders ED Discharge Orders    None       Jacalyn Lefevre, MD 04/16/20 1651

## 2020-04-16 NOTE — ED Notes (Signed)
Patient verbalizes understanding of discharge instructions. Opportunity for questioning and answers were provided. Armband removed by staff, pt discharged from ED via wheelchair.  

## 2020-04-16 NOTE — ED Provider Notes (Signed)
4:30 PM-checkout from Dr. Gilford Raid to evaluate after he has been seen by TTS.  He requested detox.  He has been seen by TTS, and a have referred him to peer support, will follow up with him tomorrow.  They have declined psychiatric admission or ongoing management.  Laboratory tests reviewed, drug screen positive for marijuana, magnesium slightly low, replaced, CBC normal except platelets low, alcohol level slightly elevated, c-Met with mild transaminitis.  Chest x-ray reviewed, chronic changes, present since at least 2016.  Chest x-ray reveals chronic changes of cavitary lesions, subacute rib fractures and acute right rib fractures.  Patient evaluated by me at 7:10 PM.  He is alert, calm and cooperative.  He admits to falling, as source for multiple rib fractures.  He states that he lives with his girlfriend.  He states he does not see a pulmonologist regarding his chronic lung abnormality.  Since arrival the patient's blood pressure has been trending up.  He denies headache, chest pain, abdominal pain at this time.  No respiratory distress.  He is alert and lucid.  Review of prior blood pressures indicate moderate hypertension on prior visits.  He does not take antihypertensive medications.  Weight is stable.  Repeat blood pressure at discharge is normal.        Daleen Bo, MD 04/16/20 1940

## 2020-04-16 NOTE — ED Notes (Signed)
Taken for [employer]

## 2020-04-17 ENCOUNTER — Emergency Department (HOSPITAL_COMMUNITY)
Admission: EM | Admit: 2020-04-17 | Discharge: 2020-04-17 | Disposition: A | Discharge status: Home / Self Care | Payer: Medicare Other | Attending: Emergency Medicine | Admitting: Emergency Medicine

## 2020-04-17 ENCOUNTER — Encounter (HOSPITAL_COMMUNITY): Payer: Self-pay | Admitting: Emergency Medicine

## 2020-04-17 ENCOUNTER — Other Ambulatory Visit: Payer: Self-pay

## 2020-04-17 DIAGNOSIS — F17210 Nicotine dependence, cigarettes, uncomplicated: Secondary | ICD-10-CM | POA: Insufficient documentation

## 2020-04-17 DIAGNOSIS — M25561 Pain in right knee: Secondary | ICD-10-CM | POA: Diagnosis not present

## 2020-04-17 DIAGNOSIS — F1020 Alcohol dependence, uncomplicated: Secondary | ICD-10-CM | POA: Insufficient documentation

## 2020-04-17 DIAGNOSIS — M25562 Pain in left knee: Secondary | ICD-10-CM | POA: Insufficient documentation

## 2020-04-17 DIAGNOSIS — M170 Bilateral primary osteoarthritis of knee: Secondary | ICD-10-CM | POA: Insufficient documentation

## 2020-04-17 DIAGNOSIS — G8929 Other chronic pain: Secondary | ICD-10-CM

## 2020-04-17 MED ORDER — LORAZEPAM 1 MG PO TABS
1.0000 mg | ORAL_TABLET | Freq: Once | ORAL | Status: AC
  Administered 2020-04-17: 1 mg via ORAL
  Filled 2020-04-17: qty 1

## 2020-04-17 MED ORDER — IBUPROFEN 200 MG PO TABS
400.0000 mg | ORAL_TABLET | Freq: Once | ORAL | Status: AC
  Administered 2020-04-17: 400 mg via ORAL
  Filled 2020-04-17: qty 2

## 2020-04-17 MED ORDER — CHLORDIAZEPOXIDE HCL 25 MG PO CAPS
ORAL_CAPSULE | ORAL | 0 refills | Status: DC
Start: 2020-04-17 — End: 2021-08-28

## 2020-04-17 NOTE — Discharge Instructions (Signed)
Use ibuprofen 400mg  every 6 hours for need pain. Stop taking this if you notice abdominal pain or blood in the stool.   Use librium for alcohol withdrawal symptoms and follow-up with [facility] as referred to yesterday.

## 2020-04-17 NOTE — ED Notes (Signed)
Discharge instructions discussed with pt. Pt verbalized understanding with no questions at this time. Pt to go home with friend 

## 2020-04-17 NOTE — Discharge Instructions (Addendum)
You may take ibuprofen 800 mg every 8 hours as needed for pain.

## 2020-04-17 NOTE — ED Provider Notes (Signed)
[facility] Provider Note   CSN: [id number] Arrival date & time: 04/17/20  3149     History Chief Complaint  Patient presents with  . Knee Pain    Frederick [name] is a 59 y.o. male.  Patient presents the emergency department this morning for bilateral knee pain.  He states that he has arthritis in his knees.  He has been treated with injections in the past.  Of note, this is patient's third ED visit in the past 24 hours.  First visit yesterday was for alcohol detox.  Patient was seen by TTS, referred [facility].  He was discharged at that time.  He represented to the emergency department early this morning with complaint of fall.  Patient had imaging performed which is negative for fracture.  He now presents for knee pain.  He denies any injuries to his knees.  Denies any swelling, fevers, redness.  No history of gout.  Review of records show that he was evaluated by Dr. Magnus Ivan of orthopedics earlier this year.  Onset of symptoms acute.  Course is constant.  Pain is worse with movement.        Past Medical History:  Diagnosis Date  . Arthritis   . Asthma   . EtOH dependence (HCC)    drinks 2-3 40s a day  . Fibula fracture    Dr Dion Saucier 02-2011  . Hernia    surgery 01-17-13  . Hypertension   . Tuberculosis     Patient Active Problem List   Diagnosis Date Noted  . Marijuana abuse 07/22/2017  . Hyponatremia 12/03/2015  . Left rib fracture 11/29/2015  . Microcytic anemia 11/29/2015  . Cigarette smoker 11/29/2015  . Protein-calorie malnutrition, severe (HCC) 11/29/2015  . Unintentional weight loss 11/29/2015  . History of tuberculosis 11/28/2015  . Cavitary lesion of lung 11/28/2015  . SBO (small bowel obstruction) (HCC) 03/10/2013  . Abdominal pain, diffuse 03/10/2013  . Nausea and vomiting 03/10/2013  . Fracture of fibula, proximal 01/21/2013  . Closed fracture of left distal distal tibia 01/21/2013  . EtOH dependence (HCC)   . Fibula  fracture   . Hernia     Past Surgical History:  Procedure Laterality Date  . ANKLE SURGERY    . INGUINAL HERNIA REPAIR Right 01/19/2013   Procedure: HERNIA REPAIR INGUINAL ADULT;  Surgeon: [name], MD,FACS;  Location: [facility] OR;  Service: General;  Laterality: Right;  . TIBIA IM NAIL INSERTION Left 01/22/2013   Procedure: INTRAMEDULLARY (IM) NAIL TIBIAL;  Surgeon: [name], MD;  Location: [facility] OR;  Service: Orthopedics;  Laterality: Left;       Family History  Problem Relation Age of Onset  . Diabetes Mother   . Diabetes Father   . Hypertension Father     Social History   Tobacco Use  . Smoking status: Current Every Day Smoker    Packs/day: 0.50    Types: Cigarettes  . Smokeless tobacco: Never Used  . Tobacco comment: 1 ppd  Substance Use Topics  . Alcohol use: Yes    Alcohol/week: 14.0 standard drinks    Types: 14 Cans of beer per week    Comment: occasionally; generally drinks (2) 40s/day  . Drug use: No    Home Medications Prior to Admission medications   Medication Sig Start Date End Date Taking? Authorizing Provider  acetaminophen (TYLENOL) 325 MG tablet Take 650 mg by mouth every 6 (six) hours as needed for mild pain or headache.  Yes [provider]  albuterol (VENTOLIN HFA) 108 (90 Base) MCG/ACT inhaler Inhale 2 puffs into the lungs at bedtime.  12/06/19  Yes [provider]  diclofenac (VOLTAREN) 75 MG EC tablet Take 75 mg by mouth 2 (two) times daily as needed for mild pain.  04/03/20  Yes [provider]  fluticasone (FLONASE) 50 MCG/ACT nasal spray Place 1 spray into both nostrils daily. Patient taking differently: Place 1 spray into both nostrils daily as needed for allergies.  07/28/15  Yes Hedges, Dellis Filbert, PA-C  methocarbamol (ROBAXIN) 500 MG tablet Take 500 mg by mouth 2 (two) times daily as needed for muscle spasms.  04/03/20  Yes [provider]  Multiple Vitamin (MULTIVITAMIN WITH MINERALS) TABS tablet Take 1  tablet by mouth daily. 05/31/19  Yes Horton, Barbette Hair, MD  polyvinyl alcohol (LIQUIFILM TEARS) 1.4 % ophthalmic solution Place 1 drop into both eyes as needed for dry eyes.   Yes [provider]  SPIRIVA RESPIMAT 2.5 MCG/ACT AERS Inhale 2 sprays into the lungs every other day.  11/24/15  Yes [provider]  benzonatate (TESSALON) 200 MG capsule Take 1 capsule (200 mg total) by mouth 3 (three) times daily as needed for cough. Patient not taking: Reported on 02/12/2020 12/02/15   Hosie Poisson, MD  ferrous sulfate 325 (65 FE) MG tablet Take 1 tablet (325 mg total) by mouth 2 (two) times daily with a meal. Patient not taking: Reported on 02/12/2020 12/03/15   Janece Canterbury, MD  folic acid (FOLVITE) 1 MG tablet Take 1 tablet (1 mg total) by mouth daily. Patient not taking: Reported on 02/12/2020 12/02/15   Hosie Poisson, MD  ipratropium-albuterol (DUONEB) 0.5-2.5 (3) MG/3ML SOLN Take 3 mLs by nebulization every 6 (six) hours as needed. Patient not taking: Reported on 02/12/2020 12/02/15   Hosie Poisson, MD  loratadine-pseudoephedrine (CLARITIN-D 12 HOUR) 5-120 MG per tablet Take 1 tablet by mouth 2 (two) times daily. Patient not taking: Reported on 02/12/2020 05/13/15   Leo Grosser, MD  thiamine 100 MG tablet Take 1 tablet (100 mg total) by mouth daily. Patient not taking: Reported on 02/12/2020 05/31/19   Horton, Barbette Hair, MD  traMADol (ULTRAM) 50 MG tablet Take 1 tablet (50 mg total) by mouth every 6 (six) hours as needed. Patient not taking: Reported on 04/17/2020 02/12/20   Lajean Saver, MD  zolpidem (AMBIEN) 5 MG tablet Take 1 tablet (5 mg total) by mouth at bedtime as needed for sleep. Patient not taking: Reported on 02/12/2020 12/02/15   Hosie Poisson, MD    Allergies    Patient has no known allergies.  Review of Systems   Review of Systems  Constitutional: Negative for activity change.  Musculoskeletal: Positive for arthralgias. Negative for back pain, gait problem, joint  swelling and neck pain.  Skin: Negative for wound.  Neurological: Negative for weakness and numbness.    Physical Exam Updated Vital Signs BP (!) 167/97 (BP Location: Left Arm)   Pulse 98   Temp 98.2 F (36.8 C) (Oral)   Resp 18   Ht 6\' 1"  (1.854 m)   Wt 74.8 kg   SpO2 100%   BMI 21.77 kg/m   Physical Exam Vitals and nursing note reviewed.  Constitutional:      Appearance: He is well-developed.  HENT:     Head: Normocephalic and atraumatic.  Eyes:     Conjunctiva/sclera: Conjunctivae normal.  Cardiovascular:     Pulses: Normal pulses. No decreased pulses.  Musculoskeletal:  General: Tenderness present.     Cervical back: Normal range of motion and neck supple.     Right knee: No swelling or effusion. Normal range of motion. Tenderness present over the medial joint line and lateral joint line.     Left knee: No swelling or effusion. Normal range of motion. Tenderness present over the medial joint line and lateral joint line.     Comments: Bilateral knee pain on the joint line.  No signs of effusion.  Doubt septic arthritis.  Patient is able to stand from the wheelchair and transferred to the bed without any difficulty.  He bears weight.  Skin:    General: Skin is warm and dry.  Neurological:     Mental Status: He is alert.     Sensory: No sensory deficit.     Comments: Motor, sensation, and vascular distal to the injury is fully intact.      ED Results / Procedures / Treatments   Labs (all labs ordered are listed, but only abnormal results are displayed) Labs Reviewed - No data to display  EKG None  Radiology DG Chest 2 View  Result Date: 04/16/2020 CLINICAL DATA:  Right-sided chest pain. EXAM: CHEST - 2 VIEW COMPARISON:  February 12, 2020 FINDINGS: Mild diffuse chronic appearing increased lung markings are seen. There is mild, stable right-sided volume loss. Numerous small nodular appearing opacities are seen scattered throughout both lungs. A stable 8.3 cm x  5.8 cm lobulated cavitary lesion is seen within the right apex. The heart size and mediastinal contours are within normal limits. Acute, displaced fractures of the third and fourth right ribs are seen. Multiple chronic right rib fractures are also noted. Multiple radiopaque fixation screws are seen within the proximal right humerus. Multilevel degenerative changes seen throughout the thoracic spine. IMPRESSION: 1. Acute, displaced third and fourth right rib fractures. 2. Stable 8.3 cm x 5.8 cm lobulated cavitary lesion within the right apex. 3. Stable diffuse chronic appearing increased lung markings with numerous small nodular appearing opacities scattered throughout both lungs. Electronically Signed   By: Aram Candela M.D.   On: 04/16/2020 18:50   DG Ankle Complete Left  Result Date: 04/16/2020 CLINICAL DATA:  Ankle pain EXAM: LEFT ANKLE COMPLETE - 3+ VIEW COMPARISON:  01/22/2013 FINDINGS: Orthopedic hardware is again identified in the distal fibula and the tibia. There is fracture of the distal fixation screws within the distal tibia. The medullary rod appears stable. No hardware failure along the distal fibula is seen. Healed fracture is noted. No other focal abnormality is seen. IMPRESSION: Fracture of the distal tibial fixation screws. Healed fracture in the tibia and fibula are seen. No acute bony abnormality is noted. Electronically Signed   By: Alcide Clever M.D.   On: 04/16/2020 23:10   DG Hip Unilat W or Wo Pelvis 2-3 Views Left  Result Date: 04/16/2020 CLINICAL DATA:  Hip pain EXAM: DG HIP (WITH OR WITHOUT PELVIS) 2-3V LEFT COMPARISON:  None. FINDINGS: Pelvic ring is intact. No acute fracture or dislocation is noted. No soft tissue abnormality is seen. Mild degenerative changes of the left hip joint are noted. IMPRESSION: No acute abnormality noted. Electronically Signed   By: Alcide Clever M.D.   On: 04/16/2020 23:08    Procedures Procedures (including critical care time)  Medications  Ordered in ED Medications  ibuprofen (ADVIL) tablet 400 mg (400 mg Oral Given 04/17/20 0851)  LORazepam (ATIVAN) tablet 1 mg (1 mg Oral Given 04/17/20 0851)    ED  Course  I have reviewed the triage vital signs and the nursing notes.  Pertinent labs & imaging results that were available during my care of the patient were reviewed by me and considered in my medical decision making (see chart for details).  Patient seen and examined.  This is his third visit in the past 24 hours.  Patient evaluated for chronic knee pain here.  He has no effusions or signs of emergent medical problems.  He does have some slight tremor which could be due to early alcohol withdrawal.  Patient was given 1 mg of oral Ativan.  Recent slight elevation in LFTs.  Patient was treated with 400 mg of ibuprofen given normal kidney function no current symptoms of peptic ulcer disease or GI bleeding.  Patient encouraged to follow-up with PCP, substance abuse referrals provided yesterday, orthopedics.  Vital signs reviewed and are as follows: BP (!) 167/97 (BP Location: Left Arm)   Pulse 98   Temp 98.2 F (36.8 C) (Oral)   Resp 18   Ht 6\' 1"  (1.854 m)   Wt 74.8 kg   SpO2 100%   BMI 21.77 kg/m       MDM Rules/Calculators/A&P                      Chronic knee pain.  Normal exam.  Imaging not felt indicated.  Final Clinical Impression(s) / ED Diagnoses Final diagnoses:  Chronic pain of both knees    Rx / DC Orders ED Discharge Orders         Ordered    chlordiazePOXIDE (LIBRIUM) 25 MG capsule     04/17/20 0917           04/19/20, PA-C 04/17/20 04/19/20    [id number], MD 04/17/20 [phone]

## 2020-04-17 NOTE — ED Provider Notes (Signed)
TIME SEEN: 2:12 AM  CHIEF COMPLAINT: fall  HPI: Patient is a 59 year old male with history of fibular fracture status post surgery with Dr. Mardelle Matte in 2012, alcohol abuse who presents to the emergency department with complaints of left hip pain after he slipped and fell when leaving the emergency department earlier today.  Did not hit his head or lose consciousness.  No neck or back pain.  Only complaining of pain in the left lateral hip.  Not complaining of left ankle or foot pain at this time.  Able to ambulate.  ROS: See HPI Constitutional: no fever  Eyes: no drainage  ENT: no runny nose   Cardiovascular:  no chest pain  Resp: no SOB  GI: no vomiting GU: no dysuria Integumentary: no rash  Allergy: no hives  Musculoskeletal: no leg swelling  Neurological: no slurred speech ROS otherwise negative  PAST MEDICAL HISTORY/PAST SURGICAL HISTORY:  Past Medical History:  Diagnosis Date  . Arthritis   . Asthma   . EtOH dependence (HCC)    drinks 2-3 40s a day  . Fibula fracture    Dr Mardelle Matte 02-2011  . Hernia    surgery 01-17-13  . Hypertension   . Tuberculosis     MEDICATIONS:  Prior to Admission medications   Medication Sig Start Date End Date Taking? Authorizing Provider  albuterol (VENTOLIN HFA) 108 (90 Base) MCG/ACT inhaler Inhale 2 puffs into the lungs 4 (four) times daily as needed for shortness of breath. 12/06/19   [provider]  benzonatate (TESSALON) 200 MG capsule Take 1 capsule (200 mg total) by mouth 3 (three) times daily as needed for cough. Patient not taking: Reported on 02/12/2020 12/02/15   Hosie Poisson, MD  ferrous sulfate 325 (65 FE) MG tablet Take 1 tablet (325 mg total) by mouth 2 (two) times daily with a meal. Patient not taking: Reported on 02/12/2020 12/03/15   Janece Canterbury, MD  fluticasone [facility]) 50 MCG/ACT nasal spray Place 1 spray into both nostrils daily. Patient taking differently: Place 1 spray into both nostrils daily as needed for  allergies.  07/28/15   Hedges, Dellis Filbert, PA-C  folic acid (FOLVITE) 1 MG tablet Take 1 tablet (1 mg total) by mouth daily. Patient not taking: Reported on 02/12/2020 12/02/15   Hosie Poisson, MD  ipratropium-albuterol (DUONEB) 0.5-2.5 (3) MG/3ML SOLN Take 3 mLs by nebulization every 6 (six) hours as needed. Patient not taking: Reported on 02/12/2020 12/02/15   Hosie Poisson, MD  loratadine-pseudoephedrine (CLARITIN-D 12 HOUR) 5-120 MG per tablet Take 1 tablet by mouth 2 (two) times daily. Patient not taking: Reported on 02/12/2020 05/13/15   Leo Grosser, MD  Multiple Vitamin (MULTIVITAMIN WITH MINERALS) TABS tablet Take 1 tablet by mouth daily. 05/31/19   Horton, Barbette Hair, MD  SPIRIVA RESPIMAT 2.5 MCG/ACT AERS Inhale 2 sprays into the lungs daily. 11/24/15   [provider]  thiamine 100 MG tablet Take 1 tablet (100 mg total) by mouth daily. Patient not taking: Reported on 02/12/2020 05/31/19   Horton, Barbette Hair, MD  traMADol (ULTRAM) 50 MG tablet Take 1 tablet (50 mg total) by mouth every 6 (six) hours as needed. 02/12/20   Lajean Saver, MD  zolpidem (AMBIEN) 5 MG tablet Take 1 tablet (5 mg total) by mouth at bedtime as needed for sleep. Patient not taking: Reported on 02/12/2020 12/02/15   Hosie Poisson, MD    ALLERGIES:  No Known Allergies  SOCIAL HISTORY:  Social History   Tobacco Use  . Smoking status:  Current Every Day Smoker    Packs/day: 0.50    Types: Cigarettes  . Smokeless tobacco: Never Used  . Tobacco comment: 1 ppd  Substance Use Topics  . Alcohol use: Yes    Alcohol/week: 14.0 standard drinks    Types: 14 Cans of beer per week    Comment: occasionally; generally drinks (2) 40s/day    FAMILY HISTORY: Family History  Problem Relation Age of Onset  . Diabetes Mother   . Diabetes Father   . Hypertension Father     EXAM: BP (!) 159/103 (BP Location: Right Arm)   Pulse 98   Temp 98 F (36.7 C) (Oral)   Resp 16   SpO2 100%  CONSTITUTIONAL: Alert and responds  appropriately to questions. Well-appearing; well-nourished HEAD: Normocephalic, atraumatic EYES: Conjunctivae clear, pupils appear equal ENT: normal nose; moist mucous membranes NECK: Normal range of motion CARD: Regular rate and rhythm RESP: Normal chest excursion without splinting or tachypnea; no hypoxia or respiratory distress, speaking full sentences ABD/GI: non-distended EXT: Normal ROM in all joints, no major deformities noted, no tenderness throughout the left lower extremity, extremity warm well perfused, compartments are soft, no leg length discrepancy on exam SKIN: Normal color for age and race, no rashes on exposed skin NEURO: Moves all extremities equally, normal speech, no facial asymmetry noted, ambulates with normal gait PSYCH: The patient's mood and manner are appropriate. Grooming and personal hygiene are appropriate.  MEDICAL DECISION MAKING: Patient here with left hip pain after fall.  X-rays reviewed/interpreted.  Left hip x-ray shows no acute abnormality.  No left ankle pain currently on exam.  Does have a fracture of the distal tibia fixation screws that appear new but last x-ray was in 2014.  Otherwise no bony fractures or dislocation.  He is not having any pain in this area.  He is able to ambulate here.  I feel he is safe for discharge home.  At this time, I do not feel there is any life-threatening condition present. I have reviewed, interpreted and discussed all results (EKG, imaging, lab, urine as appropriate) and exam findings with patient/family. I have reviewed nursing notes and appropriate previous records.  I feel the patient is safe to be discharged home without further emergent workup and can continue workup as an outpatient as needed. Discussed usual and customary return precautions. Patient/family verbalize understanding and are comfortable with this plan.  Outpatient follow-up has been provided as needed. All questions have been answered.   Frederick [name] was  evaluated in Emergency Department on 04/17/2020 for the symptoms described in the history of present illness. He was evaluated in the context of the global COVID-19 pandemic, which necessitated consideration that the patient might be at risk for infection with the SARS-CoV-2 virus that causes COVID-19. Institutional protocols and algorithms that pertain to the evaluation of patients at risk for COVID-19 are in a state of rapid change based on information released by regulatory bodies including the CDC and federal and state organizations. These policies and algorithms were followed during the patient's care in the ED.      Layani Foronda, Layla Maw, DO 04/17/20 0214

## 2020-04-17 NOTE — ED Triage Notes (Signed)
Pt c/o bilat knee pain chronic in nature related to work hx of [employer]. Pt requesting cortizone injection in knees

## 2020-04-18 ENCOUNTER — Ambulatory Visit (HOSPITAL_COMMUNITY)
Admission: RE | Admit: 2020-04-18 | Discharge: 2020-04-18 | Disposition: A | Discharge status: Home / Self Care | Payer: Medicare Other | Attending: Psychiatry | Admitting: Psychiatry

## 2020-04-18 DIAGNOSIS — F1020 Alcohol dependence, uncomplicated: Secondary | ICD-10-CM | POA: Diagnosis present

## 2020-04-18 DIAGNOSIS — Z590 Homelessness: Secondary | ICD-10-CM | POA: Diagnosis not present

## 2020-04-18 NOTE — H&P (Signed)
Behavioral Health Medical Screening Exam  Frederick [name] is an 59 y.o. male who presents voluntarily as a walk-in brought in by his cousin. Pt reports he came in for detox services. Pt states he drinks 40oz of liquor nightly. He reports he is currently homeless and his girlfriend put him out of the house last week and he also needs a place to stay. Pt denies SI, HI, self harm. AVH, paranoia and SA.   Total Time spent with patient: 20 minutes  Psychiatric Specialty Exam: Physical Exam  Constitutional: He is oriented to person, place, and time. He appears well-developed and well-nourished.  HENT:  Head: Normocephalic.  Eyes: Pupils are equal, round, and reactive to light.  Respiratory: Effort normal.  Musculoskeletal:        General: Normal range of motion.     Cervical back: Normal range of motion.  Neurological: He is alert and oriented to person, place, and time.  Skin: Skin is warm and dry.  Psychiatric: He has a normal mood and affect. His speech is normal and behavior is normal. Judgment and thought content normal. Cognition and memory are normal.    Review of Systems  Psychiatric/Behavioral: Negative for dysphoric mood, hallucinations, self-injury, sleep disturbance and suicidal ideas. The patient is not nervous/anxious and is not hyperactive.   All other systems reviewed and are negative.   There were no vitals taken for this visit.There is no height or weight on file to calculate BMI.  General Appearance: Casual and Fairly Groomed  Eye Contact:  Fair  Speech:  Normal Rate  Volume:  Normal  Mood:  Euthymic  Affect:  Congruent  Thought Process:  Coherent and Descriptions of Associations: Intact  Orientation:  Full (Time, Place, and Person)  Thought Content:  WDL  Suicidal Thoughts:  No  Homicidal Thoughts:  No  Memory:  Recent;   Good  Judgement:  Fair  Insight:  Fair  Psychomotor Activity:  unstable gait(pt uses a walker)  Concentration: Concentration: Good  Recall:   Fair  Fund of Knowledge:Good  Language: Good  Akathisia:  No  Handed:  Right  AIMS (if indicated):     Assets:  Communication Skills Desire for Improvement  Sleep:       Musculoskeletal: Strength & Muscle Tone: within normal limits Gait & Station: unsteady Patient leans: N/A  Recommendations:  Based on my evaluation the patient does not appear to have an emergency medical condition.  Disposition: No evidence of imminent risk to self or others at present.   Patient does not meet criteria for psychiatric inpatient admission. Supportive therapy provided about ongoing stressors. Discussed crisis plan, support from social network, calling 911, coming to the Emergency Department, and calling Suicide Hotline.   Wandra Arthurs, NP 04/18/2020, 1:06 AM
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
## 2020-04-22 ENCOUNTER — Emergency Department (HOSPITAL_COMMUNITY)
Admission: EM | Admit: 2020-04-22 | Discharge: 2020-04-22 | Discharge status: Against Medical Advice | Payer: Medicare Other

## 2020-04-26 ENCOUNTER — Encounter (HOSPITAL_COMMUNITY): Payer: Self-pay

## 2020-04-26 ENCOUNTER — Emergency Department (HOSPITAL_COMMUNITY)
Admission: EM | Admit: 2020-04-26 | Discharge: 2020-04-27 | Disposition: A | Discharge status: Home / Self Care | Payer: Medicare Other | Attending: Emergency Medicine | Admitting: Emergency Medicine

## 2020-04-26 DIAGNOSIS — Y908 Blood alcohol level of 240 mg/100 ml or more: Secondary | ICD-10-CM | POA: Insufficient documentation

## 2020-04-26 DIAGNOSIS — I10 Essential (primary) hypertension: Secondary | ICD-10-CM | POA: Insufficient documentation

## 2020-04-26 DIAGNOSIS — Z79899 Other long term (current) drug therapy: Secondary | ICD-10-CM | POA: Diagnosis not present

## 2020-04-26 DIAGNOSIS — F17210 Nicotine dependence, cigarettes, uncomplicated: Secondary | ICD-10-CM | POA: Diagnosis not present

## 2020-04-26 DIAGNOSIS — F10920 Alcohol use, unspecified with intoxication, uncomplicated: Secondary | ICD-10-CM

## 2020-04-26 NOTE — ED Triage Notes (Signed)
Pt comes via [facility] for drinking three 40 oz beers and wants detox, drinks everyday. Pt also adds that his L shoulder also hurts, denies injury.

## 2020-04-27 DIAGNOSIS — F10920 Alcohol use, unspecified with intoxication, uncomplicated: Secondary | ICD-10-CM | POA: Diagnosis not present

## 2020-04-27 LAB — CBC
HCT: 38.2 % — ABNORMAL LOW (ref 39.0–52.0)
Hemoglobin: 12.5 g/dL — ABNORMAL LOW (ref 13.0–17.0)
MCH: 31.6 pg (ref 26.0–34.0)
MCHC: 32.7 g/dL (ref 30.0–36.0)
MCV: 96.5 fL (ref 80.0–100.0)
Platelets: 305 10*3/uL (ref 150–400)
RBC: 3.96 MIL/uL — ABNORMAL LOW (ref 4.22–5.81)
RDW: 15.1 % (ref 11.5–15.5)
WBC: 7.1 10*3/uL (ref 4.0–10.5)
nRBC: 0 % (ref 0.0–0.2)

## 2020-04-27 LAB — COMPREHENSIVE METABOLIC PANEL
ALT: 26 U/L (ref 0–44)
AST: 25 U/L (ref 15–41)
Albumin: 2.9 g/dL — ABNORMAL LOW (ref 3.5–5.0)
Alkaline Phosphatase: 86 U/L (ref 38–126)
Anion gap: 15 (ref 5–15)
BUN: <5 mg/dL — ABNORMAL LOW (ref 6–20)
CO2: 21 mmol/L — ABNORMAL LOW (ref 22–32)
Calcium: 9 mg/dL (ref 8.9–10.3)
Chloride: 101 mmol/L (ref 98–111)
Creatinine, Ser: 0.65 mg/dL (ref 0.61–1.24)
GFR calc Af Amer: >60 mL/min (ref >60)
GFR calc non Af Amer: >60 mL/min (ref >60)
Glucose, Bld: 87 mg/dL (ref 70–99)
Potassium: 3.4 mmol/L — ABNORMAL LOW (ref 3.5–5.1)
Sodium: 137 mmol/L (ref 135–145)
Total Bilirubin: 0.3 mg/dL (ref 0.3–1.2)
Total Protein: 7.1 g/dL (ref 6.5–8.1)

## 2020-04-27 LAB — RAPID URINE DRUG SCREEN, HOSP PERFORMED
Amphetamines: NOT DETECTED
Barbiturates: NOT DETECTED
Benzodiazepines: NOT DETECTED
Cocaine: NOT DETECTED
Opiates: NOT DETECTED
Tetrahydrocannabinol: NOT DETECTED

## 2020-04-27 LAB — ETHANOL: Alcohol, Ethyl (B): 241 mg/dL — ABNORMAL HIGH (ref <10)

## 2020-04-27 NOTE — ED Provider Notes (Signed)
[facility] EMERGENCY DEPARTMENT Provider Note   CSN: [id number] Arrival date & time: 04/26/20  2343     History Chief Complaint  Patient presents with  . Alcohol Problem    Frederick [name] is a 59 y.o. male.  HPI    This a 59 year old male with a history of alcohol abuse, hypertension who presents with multiple complaints.  He states "I just do not feel good."  He cannot tell me why he does not feel good.  He states that he has ongoing bilateral knee pain and "a corn on my foot that I need to have cut off."  Does report alcohol use earlier this evening.  Denies any drug use.  No active chest pain, shortness of breath, abdominal pain, nausea, vomiting.  Past Medical History:  Diagnosis Date  . Arthritis   . Asthma   . EtOH dependence (HCC)    drinks 2-3 40s a day  . Fibula fracture    Dr Dion Saucier 02-2011  . Hernia    surgery 01-17-13  . Hypertension   . Tuberculosis     Patient Active Problem List   Diagnosis Date Noted  . Marijuana abuse 07/22/2017  . Hyponatremia 12/03/2015  . Left rib fracture 11/29/2015  . Microcytic anemia 11/29/2015  . Cigarette smoker 11/29/2015  . Protein-calorie malnutrition, severe (HCC) 11/29/2015  . Unintentional weight loss 11/29/2015  . History of tuberculosis 11/28/2015  . Cavitary lesion of lung 11/28/2015  . SBO (small bowel obstruction) (HCC) 03/10/2013  . Abdominal pain, diffuse 03/10/2013  . Nausea and vomiting 03/10/2013  . Fracture of fibula, proximal 01/21/2013  . Closed fracture of left distal distal tibia 01/21/2013  . EtOH dependence (HCC)   . Fibula fracture   . Hernia     Past Surgical History:  Procedure Laterality Date  . ANKLE SURGERY    . INGUINAL HERNIA REPAIR Right 01/19/2013   Procedure: HERNIA REPAIR INGUINAL ADULT;  Surgeon: [name], MD,FACS;  Location: [facility] OR;  Service: General;  Laterality: Right;  . TIBIA IM NAIL INSERTION Left 01/22/2013   Procedure: INTRAMEDULLARY (IM) NAIL TIBIAL;   Surgeon: [name], MD;  Location: [facility] OR;  Service: Orthopedics;  Laterality: Left;       Family History  Problem Relation Age of Onset  . Diabetes Mother   . Diabetes Father   . Hypertension Father     Social History   Tobacco Use  . Smoking status: Current Every Day Smoker    Packs/day: 0.50    Types: Cigarettes  . Smokeless tobacco: Never Used  . Tobacco comment: 1 ppd  Substance Use Topics  . Alcohol use: Yes    Alcohol/week: 14.0 standard drinks    Types: 14 Cans of beer per week    Comment: occasionally; generally drinks (2) 40s/day  . Drug use: No    Home Medications Prior to Admission medications   Medication Sig Start Date End Date Taking? Authorizing Provider  acetaminophen (TYLENOL) 325 MG tablet Take 650 mg by mouth every 6 (six) hours as needed for mild pain or headache.    [provider]  albuterol (VENTOLIN HFA) 108 (90 Base) MCG/ACT inhaler Inhale 2 puffs into the lungs at bedtime.  12/06/19   [provider]  chlordiazePOXIDE (LIBRIUM) 25 MG capsule 50mg  PO TID x 1D, then 25-50mg  PO BID X 1D, then 25-50mg  PO QD X 1D 04/17/20   04/19/20, PA-C  diclofenac (VOLTAREN) 75 MG EC tablet Take 75 mg  by mouth 2 (two) times daily as needed for mild pain.  04/03/20   [provider]  fluticasone (FLONASE) 50 MCG/ACT nasal spray Place 1 spray into both nostrils daily. Patient taking differently: Place 1 spray into both nostrils daily as needed for allergies.  07/28/15   Hedges, Dellis Filbert, PA-C  methocarbamol (ROBAXIN) 500 MG tablet Take 500 mg by mouth 2 (two) times daily as needed for muscle spasms.  04/03/20   [provider]  Multiple Vitamin (MULTIVITAMIN WITH MINERALS) TABS tablet Take 1 tablet by mouth daily. 05/31/19   Terik Haughey, Barbette Hair, MD  polyvinyl alcohol (LIQUIFILM TEARS) 1.4 % ophthalmic solution Place 1 drop into both eyes as needed for dry eyes.    [provider]  SPIRIVA RESPIMAT 2.5 MCG/ACT AERS Inhale  2 sprays into the lungs every other day.  11/24/15   [provider]  ferrous sulfate 325 (65 FE) MG tablet Take 1 tablet (325 mg total) by mouth 2 (two) times daily with a meal. Patient not taking: Reported on 02/12/2020 12/03/15 04/17/20  Janece Canterbury, MD  ipratropium-albuterol (DUONEB) 0.5-2.5 (3) MG/3ML SOLN Take 3 mLs by nebulization every 6 (six) hours as needed. Patient not taking: Reported on 02/12/2020 12/02/15 04/17/20  Hosie Poisson, MD  zolpidem (AMBIEN) 5 MG tablet Take 1 tablet (5 mg total) by mouth at bedtime as needed for sleep. Patient not taking: Reported on 02/12/2020 12/02/15 04/17/20  Hosie Poisson, MD    Allergies    Patient has no known allergies.  Review of Systems   Review of Systems  Constitutional: Negative for fever.  Respiratory: Negative for shortness of breath.   Cardiovascular: Negative for chest pain.  Gastrointestinal: Negative for abdominal pain, nausea and vomiting.  Musculoskeletal:       Knee pain  All other systems reviewed and are negative.   Physical Exam Updated Vital Signs BP 121/81 (BP Location: Right Arm)   Pulse 93   Temp 97.7 F (36.5 C) (Oral)   Resp 18   SpO2 100%   Physical Exam Vitals and nursing note reviewed.  Constitutional:      Appearance: He is well-developed. He is not ill-appearing.     Comments: Somnolent but arousable and will answer questions  HENT:     Head: Normocephalic and atraumatic.     Nose: Nose normal.     Mouth/Throat:     Mouth: Mucous membranes are moist.  Eyes:     Pupils: Pupils are equal, round, and reactive to light.  Cardiovascular:     Rate and Rhythm: Normal rate and regular rhythm.     Heart sounds: Normal heart sounds. No murmur.  Pulmonary:     Effort: Pulmonary effort is normal. No respiratory distress.     Breath sounds: Normal breath sounds. No wheezing.  Abdominal:     General: Bowel sounds are normal.     Palpations: Abdomen is soft.     Tenderness: There is no abdominal  tenderness. There is no rebound.  Musculoskeletal:        General: No tenderness.     Cervical back: Neck supple.     Right lower leg: No edema.     Left lower leg: No edema.  Lymphadenopathy:     Cervical: No cervical adenopathy.  Skin:    General: Skin is warm and dry.  Neurological:     Mental Status: He is oriented to person, place, and time.     Comments: Somnolent but arousable  Psychiatric:  Mood and Affect: Mood normal.     ED Results / Procedures / Treatments   Labs (all labs ordered are listed, but only abnormal results are displayed) Labs Reviewed  COMPREHENSIVE METABOLIC PANEL - Abnormal; Notable for the following components:      Result Value   Potassium 3.4 (*)    CO2 21 (*)    BUN <5 (*)    Albumin 2.9 (*)    All other components within normal limits  ETHANOL - Abnormal; Notable for the following components:   Alcohol, Ethyl (B) 241 (*)    All other components within normal limits  CBC - Abnormal; Notable for the following components:   RBC 3.96 (*)    Hemoglobin 12.5 (*)    HCT 38.2 (*)    All other components within normal limits  RAPID URINE DRUG SCREEN, HOSP PERFORMED    EKG None  Radiology No results found.  Procedures Procedures (including critical care time)  Medications Ordered in ED Medications - No data to display  ED Course  I have reviewed the triage vital signs and the nursing notes.  Pertinent labs & imaging results that were available during my care of the patient were reviewed by me and considered in my medical decision making (see chart for details).    MDM Rules/Calculators/A&P                       Patient presents with vague complaints of not feeling well.  Continues to complain of bilateral knee pain and a corn on his foot.  Although he will not take off his shoes to show me.  He is somnolent but arousable.  Appears clinically intoxicated.  History of the same.  Lab work reviewed.  No significant metabolic  derangements.  Alcohol level is 241.  Patient allowed to sleep in the emergency department.  No other clinical work-up indicated at this time.  Patient was ambulatory and able to tolerate fluids prior to discharge.  After history, exam, and medical workup I feel the patient has been appropriately medically screened and is safe for discharge home. Pertinent diagnoses were discussed with the patient. Patient was given return precautions.   Final Clinical Impression(s) / ED Diagnoses Final diagnoses:  Alcoholic intoxication without complication [facility])    Rx / DC Orders ED Discharge Orders    None       Shon Baton, MD 04/27/20 [phone]

## 2020-04-27 NOTE — ED Notes (Signed)
Patient yelled out " I need some help". When this nurse asked pt what he needed, he requested a urinal. Patient advised that he would not be allowed to use a urinal, in the lobby in front of other patrons and would need to go the the bathroom. Pt was able to get into wheelchair by himself, wheeled to the bathroom and voided. Will continue to monitor.

## 2020-04-27 NOTE — ED Notes (Signed)
Patient states he hasn't been feeling right in his head, denies SI/HI, c/o chest pain into his back. States he has been seen several times for same.

## 2020-05-06 ENCOUNTER — Other Ambulatory Visit: Payer: Self-pay

## 2020-05-06 ENCOUNTER — Ambulatory Visit (HOSPITAL_COMMUNITY)
Admission: EM | Admit: 2020-05-06 | Discharge: 2020-05-06 | Disposition: A | Discharge status: Home / Self Care | Payer: Medicare Other

## 2020-05-06 ENCOUNTER — Encounter (HOSPITAL_COMMUNITY): Payer: Self-pay

## 2020-05-06 DIAGNOSIS — S91114A Laceration without foreign body of right lesser toe(s) without damage to nail, initial encounter: Secondary | ICD-10-CM

## 2020-05-06 DIAGNOSIS — Z23 Encounter for immunization: Secondary | ICD-10-CM | POA: Diagnosis not present

## 2020-05-06 DIAGNOSIS — R21 Rash and other nonspecific skin eruption: Secondary | ICD-10-CM

## 2020-05-06 DIAGNOSIS — M79671 Pain in right foot: Secondary | ICD-10-CM | POA: Diagnosis not present

## 2020-05-06 MED ORDER — DEXAMETHASONE SODIUM PHOSPHATE 10 MG/ML IJ SOLN
10.0000 mg | Freq: Once | INTRAMUSCULAR | Status: AC
  Administered 2020-05-06: 10 mg via INTRAMUSCULAR

## 2020-05-06 MED ORDER — TETANUS-DIPHTH-ACELL PERTUSSIS 5-2.5-18.5 LF-MCG/0.5 IM SUSP
INTRAMUSCULAR | Status: AC
  Filled 2020-05-06: qty 0.5

## 2020-05-06 MED ORDER — TETANUS-DIPHTH-ACELL PERTUSSIS 5-2.5-18.5 LF-MCG/0.5 IM SUSP
0.5000 mL | Freq: Once | INTRAMUSCULAR | Status: AC
  Administered 2020-05-06: 0.5 mL via INTRAMUSCULAR

## 2020-05-06 MED ORDER — AMOXICILLIN-POT CLAVULANATE 875-125 MG PO TABS
1.0000 | ORAL_TABLET | Freq: Two times a day (BID) | ORAL | 0 refills | Status: AC
Start: 2020-05-06 — End: 2020-05-16

## 2020-05-06 MED ORDER — TRIAMCINOLONE ACETONIDE 0.1 % EX CREA
1.0000 | TOPICAL_CREAM | Freq: Two times a day (BID) | CUTANEOUS | 0 refills | Status: DC
Start: 2020-05-06 — End: 2021-08-28

## 2020-05-06 MED ORDER — TRAMADOL HCL 50 MG PO TABS: 50.0000 mg | ORAL_TABLET | Freq: Four times a day (QID) | ORAL | 0 refills | Status: DC | PRN

## 2020-05-06 MED ORDER — DEXAMETHASONE SODIUM PHOSPHATE 10 MG/ML IJ SOLN
INTRAMUSCULAR | Status: AC
  Filled 2020-05-06: qty 1

## 2020-05-06 NOTE — ED Triage Notes (Addendum)
Pt c/o diffuse itching rash to torso for approx 2 days. Also c/o laceration b/t 4th and 5th toe of right foot s/p pt attempt to cut a corn out of his foot yesterday using a knife.  Last tetanus booster 2013

## 2020-05-06 NOTE — Discharge Instructions (Addendum)
I have sent in Augmentin for you to take twice daily for 10 days  I have sent in some Tramadol for you to take as needed for pain  You have received Tdap booster today  You have also received a steroid injection to help with the itching from your rash.  I have sent in some cream for you to use for your rash as well. You may use this on your body, but do not put this cream on your face.  Follow up with podiatry as soon as you are finished with antibiotics  Follow up with this office as needed or with primary care

## 2020-05-07 NOTE — ED Provider Notes (Signed)
[facility]   [id number] 05/06/20 Arrival Time: 1957  CC: RASH  SUBJECTIVE:  Frederick [name] is a 59 y.o. male who presents with a skin complaint that began about 2 days ago. Reports that the rash is itchy and scaly. Denies precipitating event or trauma. Also states that he had a corn in between his last two toes on his right foot and that he tried to cut it out with a knife 2 days ago. Since then the foot and toes have been swollen and painful. Denies changes in soaps, detergents, close contacts with similar rash, known trigger or environmental trigger, allergy. Denies medications change or starting a new medication recently.  Localizes the rash to torso. Describes it as itchy. Has not tried any medications at home for this. Denies similar symptoms in the past. Denies fever, chills, nausea, vomiting, erythema, swelling, discharge, oral lesions, SOB, chest pain, abdominal pain, changes in bowel or bladder function.    ROS: As per HPI.  All other pertinent ROS negative.     Past Medical History:  Diagnosis Date   Arthritis    Asthma    EtOH dependence (HCC)    drinks 2-3 40s a day   Fibula fracture    Dr Dion Saucier 02-2011   Hernia    surgery 01-17-13   Hypertension    Tuberculosis    Past Surgical History:  Procedure Laterality Date   ANKLE SURGERY     INGUINAL HERNIA REPAIR Right 01/19/2013   Procedure: HERNIA REPAIR INGUINAL ADULT;  Surgeon: [name], MD,FACS;  Location: [facility] OR;  Service: General;  Laterality: Right;   TIBIA IM NAIL INSERTION Left 01/22/2013   Procedure: INTRAMEDULLARY (IM) NAIL TIBIAL;  Surgeon: [name], MD;  Location: [facility] OR;  Service: Orthopedics;  Laterality: Left;   No Known Allergies No current facility-administered medications on file prior to encounter.   Current Outpatient Medications on File Prior to Encounter  Medication Sig Dispense Refill   albuterol (VENTOLIN HFA) 108 (90 Base) MCG/ACT inhaler Inhale 2 puffs into the lungs  at bedtime.      chlordiazePOXIDE (LIBRIUM) 25 MG capsule 50mg  PO TID x 1D, then 25-50mg  PO BID X 1D, then 25-50mg  PO QD X 1D 10 capsule 0   diclofenac (VOLTAREN) 75 MG EC tablet Take 75 mg by mouth 2 (two) times daily as needed for mild pain.      fluticasone (FLONASE) 50 MCG/ACT nasal spray Place 1 spray into both nostrils daily. (Patient taking differently: Place 1 spray into both nostrils daily as needed for allergies. ) 16 g 2   methocarbamol (ROBAXIN) 500 MG tablet Take 500 mg by mouth 2 (two) times daily as needed for muscle spasms.      SPIRIVA HANDIHALER 18 MCG inhalation capsule      acetaminophen (TYLENOL) 325 MG tablet Take 650 mg by mouth every 6 (six) hours as needed for mild pain or headache.     Multiple Vitamin (MULTIVITAMIN WITH MINERALS) TABS tablet Take 1 tablet by mouth daily. 30 tablet 0   polyvinyl alcohol (LIQUIFILM TEARS) 1.4 % ophthalmic solution Place 1 drop into both eyes as needed for dry eyes.     SPIRIVA RESPIMAT 2.5 MCG/ACT AERS Inhale 2 sprays into the lungs every other day.   5   [DISCONTINUED] ferrous sulfate 325 (65 FE) MG tablet Take 1 tablet (325 mg total) by mouth 2 (two) times daily with a meal. (Patient not taking: Reported on 02/12/2020) 60 tablet 0   [  DISCONTINUED] ipratropium-albuterol (DUONEB) 0.5-2.5 (3) MG/3ML SOLN Take 3 mLs by nebulization every 6 (six) hours as needed. (Patient not taking: Reported on 02/12/2020) 360 mL 1   [DISCONTINUED] zolpidem (AMBIEN) 5 MG tablet Take 1 tablet (5 mg total) by mouth at bedtime as needed for sleep. (Patient not taking: Reported on 02/12/2020) 10 tablet 0   Social History   Socioeconomic History   Marital status: Married    Spouse name: Not on file   Number of children: Not on file   Years of education: Not on file   Highest education level: Not on file  Occupational History   Not on file  Tobacco Use   Smoking status: Current Every Day Smoker    Packs/day: 0.50    Types: Cigarettes    Smokeless tobacco: Never Used   Tobacco comment: 1 ppd  Substance and Sexual Activity   Alcohol use: Yes    Alcohol/week: 14.0 standard drinks    Types: 14 Cans of beer per week    Comment: occasionally; generally drinks (2) 40s/day   Drug use: No   Sexual activity: Never  Other Topics Concern   Not on file  Social History Narrative   Not on file   Social Determinants of Health   Financial Resource Strain:    Difficulty of Paying Living Expenses:   Food Insecurity:    Worried About Charity fundraiser in the Last Year:    Arboriculturist in the Last Year:   Transportation Needs:    Film/video editor (Medical):    Lack of Transportation (Non-Medical):   Physical Activity:    Days of Exercise per Week:    Minutes of Exercise per Session:   Stress:    Feeling of Stress :   Social Connections:    Frequency of Communication with Friends and Family:    Frequency of Social Gatherings with Friends and Family:    Attends Religious Services:    Active Member of Clubs or Organizations:    Attends Music therapist:    Marital Status:   Intimate Partner Violence:    Fear of Current or Ex-Partner:    Emotionally Abused:    Physically Abused:    Sexually Abused:    Family History  Problem Relation Age of Onset   Diabetes Mother    Diabetes Father    Hypertension Father     OBJECTIVE: Vitals:   05/06/20 2030  BP: (!) 150/87  Pulse: 86  Resp: 16  Temp: 98.4 F (36.9 C)  TempSrc: Oral  SpO2: 98%    General appearance: alert; no distress Head: NCAT Lungs: clear to auscultation bilaterally Heart: regular rate and rhythm.  Radial pulse 2+ bilaterally Extremities: no edema Skin: warm and dry; there are dry patches to back and chest, excoriation marks present, Right toes are swollen and tender, wound in between last two toes on the R foot, draining yellow fluid Psychological: alert and cooperative; normal mood and  affect  ASSESSMENT & PLAN:  1. Right foot pain   2. Laceration of fifth toe of right foot, initial encounter   3. Rash and nonspecific skin eruption     Meds ordered this encounter  Medications   amoxicillin-clavulanate (AUGMENTIN) 875-125 MG tablet    Sig: Take 1 tablet by mouth 2 (two) times daily for 10 days.    Dispense:  20 tablet    Refill:  0    Order Specific Question:   Supervising Provider  Answer:   Merrilee Jansky [id number]   Tdap (BOOSTRIX) injection 0.5 mL   dexamethasone (DECADRON) injection 10 mg   traMADol (ULTRAM) 50 MG tablet    Sig: Take 1 tablet (50 mg total) by mouth every 6 (six) hours as needed.    Dispense:  15 tablet    Refill:  0    Order Specific Question:   Supervising Provider    Answer:   Merrilee Jansky [id number]   triamcinolone cream (KENALOG) 0.1 %    Sig: Apply 1 application topically 2 (two) times daily.    Dispense:  30 g    Refill:  0    Order Specific Question:   Supervising Provider    Answer:   Merrilee Jansky [[id number]]    Rash Gave Decadron 10mg  IM in office Prescribed triamcinolone  Follow up with this office or with primary care if rash is not improving  Right Foot Pain Laceration Tdap booster in office today Prescribed Augmentin Take as prescribed and to completion Prescribed Tramadol prn pain Avoid hot showers/ baths Moisturize skin daily  Follow up with PCP if symptoms persists Return or go to the ER if you have any new or worsening symptoms such as fever, chills, nausea, vomiting, redness, swelling, discharge, if symptoms do not improve with medications  Reviewed expectations re: course of current medical issues. Questions answered. Outlined signs and symptoms indicating need for more acute intervention. Patient verbalized understanding. After Visit Summary given.   , NP 05/07/20 1735

## 2020-05-13 ENCOUNTER — Ambulatory Visit: Payer: Medicare Other | Admitting: Orthopaedic Surgery

## 2020-05-14 ENCOUNTER — Ambulatory Visit (INDEPENDENT_AMBULATORY_CARE_PROVIDER_SITE_OTHER): Payer: Medicare Other | Admitting: Physician Assistant

## 2020-05-14 ENCOUNTER — Other Ambulatory Visit: Payer: Self-pay

## 2020-05-14 ENCOUNTER — Encounter: Payer: Self-pay | Admitting: Orthopaedic Surgery

## 2020-05-14 ENCOUNTER — Encounter: Payer: Self-pay | Admitting: Neurology

## 2020-05-14 VITALS — Ht 73.0 in | Wt 165.0 lb

## 2020-05-14 DIAGNOSIS — G8929 Other chronic pain: Secondary | ICD-10-CM

## 2020-05-14 DIAGNOSIS — M25561 Pain in right knee: Secondary | ICD-10-CM | POA: Diagnosis not present

## 2020-05-14 DIAGNOSIS — M25562 Pain in left knee: Secondary | ICD-10-CM | POA: Diagnosis not present

## 2020-05-14 DIAGNOSIS — R269 Unspecified abnormalities of gait and mobility: Secondary | ICD-10-CM

## 2020-05-14 MED ORDER — METHYLPREDNISOLONE ACETATE 40 MG/ML IJ SUSP
40.0000 mg | INTRAMUSCULAR | Status: AC | PRN
  Administered 2020-05-14: 40 mg via INTRA_ARTICULAR

## 2020-05-14 MED ORDER — LIDOCAINE HCL 1 % IJ SOLN
0.5000 mL | INTRAMUSCULAR | Status: AC | PRN
  Administered 2020-05-14: .5 mL

## 2020-05-14 NOTE — Progress Notes (Signed)
Office Visit Note   Patient: Frederick [name]           Date of Birth: [birth year]           MRN: [id number] Visit Date: 05/14/2020              Requested by: Pa, [place] [address] [place],  [place] [id number] PCP: Pa, [facility]   Assessment & Plan: Visit Diagnoses:  1. Gait disturbance   2. Chronic pain of left knee   3. Chronic pain of right knee     Plan: We will refer him to the bowel or neurology to evaluate his gait disturbance.  He will follow up with [place] as needed.  He understands to wait at least 3 months between injections in his knees. Follow-Up Instructions: No follow-ups on file.   Orders:  Orders Placed This Encounter  Procedures  . Large Joint Inj: bilateral knee  . Ambulatory referral to Neurology   No orders of the defined types were placed in this encounter.     Procedures: Large Joint Inj: bilateral knee on 05/14/2020 12:15 PM Indications: pain Details: 22 G 1.5 in needle, anterolateral approach  Arthrogram: No  Medications (Right): 0.5 mL lidocaine 1 %; 40 mg methylPREDNISolone acetate 40 MG/ML Medications (Left): 0.5 mL lidocaine 1 %; 40 mg methylPREDNISolone acetate 40 MG/ML Outcome: tolerated well, no immediate complications Procedure, treatment alternatives, risks and benefits explained, specific risks discussed. Consent was given by the patient. Immediately prior to procedure a time out was called to verify the correct patient, procedure, equipment, support staff and site/side marked as required. Patient was prepped and draped in the usual sterile fashion.       Clinical Data: No additional findings.   Subjective: Chief Complaint  Patient presents with  . Right Knee - Follow-up  . Left Knee - Follow-up    HPI Frederick [name] returns today due to bilateral knee pain.  He had cortisone injections both knees back in January states that this helped him walk better.  He does have a gait disturbance we referred him to neurology  however he did not go due to high co-pay.  He is requesting injections in both knees.  He has known chronic knee pain.  He is nondiabetic.  Does state that he is stopped drinking.  No new injury to either knee.  Review of Systems See HPI otherwise negative or noncontributory.  Objective: Vital Signs: Ht 6\' 1"  (1.854 m)   Wt 165 lb (74.8 kg)   BMI 21.77 kg/m   Physical Exam General: Well-developed well-nourished male no acute distress. Ortho Exam Bilateral knees good range of motion both knees.  No abnormal warmth erythema or effusion.  He ambulates with a cane with a gait disturbance.  Specialty Comments:  No specialty comments available.  Imaging: No results found.   PMFS History: Patient Active Problem List   Diagnosis Date Noted  . Marijuana abuse 07/22/2017  . Hyponatremia 12/03/2015  . Left rib fracture 11/29/2015  . Microcytic anemia 11/29/2015  . Cigarette smoker 11/29/2015  . Protein-calorie malnutrition, severe ([place]) 11/29/2015  . Unintentional weight loss 11/29/2015  . History of tuberculosis 11/28/2015  . Cavitary lesion of lung 11/28/2015  . SBO (small bowel obstruction) ([place]) 03/10/2013  . Abdominal pain, diffuse 03/10/2013  . Nausea and vomiting 03/10/2013  . Fracture of fibula, proximal 01/21/2013  . Closed fracture of left distal distal tibia 01/21/2013  . EtOH dependence ([place])   . Fibula fracture   .  Hernia    Past Medical History:  Diagnosis Date  . Arthritis   . Asthma   . EtOH dependence (HCC)    drinks 2-3 40s a day  . Fibula fracture    Dr Dion Saucier 02-2011  . Hernia    surgery 01-17-13  . Hypertension   . Tuberculosis     Family History  Problem Relation Age of Onset  . Diabetes Mother   . Diabetes Father   . Hypertension Father     Past Surgical History:  Procedure Laterality Date  . ANKLE SURGERY    . INGUINAL HERNIA REPAIR Right 01/19/2013   Procedure: HERNIA REPAIR INGUINAL ADULT;  Surgeon: [name], MD,FACS;  Location: [facility]  OR;  Service: General;  Laterality: Right;  . TIBIA IM NAIL INSERTION Left 01/22/2013   Procedure: INTRAMEDULLARY (IM) NAIL TIBIAL;  Surgeon: [name], MD;  Location: [facility] OR;  Service: Orthopedics;  Laterality: Left;   Social History   Occupational History  . Not on file  Tobacco Use  . Smoking status: Current Every Day Smoker    Packs/day: 0.50    Types: Cigarettes  . Smokeless tobacco: Never Used  . Tobacco comment: 1 ppd  Substance and Sexual Activity  . Alcohol use: Yes    Alcohol/week: 14.0 standard drinks    Types: 14 Cans of beer per week    Comment: occasionally; generally drinks (2) 40s/day  . Drug use: No  . Sexual activity: Never

## 2020-06-06 ENCOUNTER — Encounter: Payer: Self-pay | Admitting: Podiatry

## 2020-06-06 ENCOUNTER — Ambulatory Visit (INDEPENDENT_AMBULATORY_CARE_PROVIDER_SITE_OTHER): Payer: Medicare HMO

## 2020-06-06 ENCOUNTER — Ambulatory Visit (INDEPENDENT_AMBULATORY_CARE_PROVIDER_SITE_OTHER): Payer: Medicare HMO | Admitting: Podiatry

## 2020-06-06 ENCOUNTER — Other Ambulatory Visit: Payer: Self-pay

## 2020-06-06 VITALS — Temp 97.3°F

## 2020-06-06 DIAGNOSIS — L97511 Non-pressure chronic ulcer of other part of right foot limited to breakdown of skin: Secondary | ICD-10-CM

## 2020-06-06 DIAGNOSIS — E11621 Type 2 diabetes mellitus with foot ulcer: Secondary | ICD-10-CM | POA: Diagnosis not present

## 2020-06-06 DIAGNOSIS — M2041 Other hammer toe(s) (acquired), right foot: Secondary | ICD-10-CM | POA: Diagnosis not present

## 2020-06-06 DIAGNOSIS — M79671 Pain in right foot: Secondary | ICD-10-CM

## 2020-06-06 DIAGNOSIS — B351 Tinea unguium: Secondary | ICD-10-CM

## 2020-06-06 DIAGNOSIS — M21611 Bunion of right foot: Secondary | ICD-10-CM

## 2020-06-06 DIAGNOSIS — M2042 Other hammer toe(s) (acquired), left foot: Secondary | ICD-10-CM

## 2020-06-06 DIAGNOSIS — M79674 Pain in right toe(s): Secondary | ICD-10-CM

## 2020-06-06 DIAGNOSIS — M79675 Pain in left toe(s): Secondary | ICD-10-CM | POA: Diagnosis not present

## 2020-06-06 MED ORDER — DOXYCYCLINE HYCLATE 100 MG PO CAPS: 100.0000 mg | ORAL_CAPSULE | Freq: Two times a day (BID) | ORAL | 0 refills | Status: AC

## 2020-06-06 MED ORDER — GENTAMICIN SULFATE 0.1 % EX CREA: TOPICAL_CREAM | CUTANEOUS | 0 refills | Status: DC

## 2020-06-06 NOTE — Patient Instructions (Addendum)
DRESSING CHANGES 4TH WEBSPACE RIGHT FOOT:  PERFORM ONCE DAILY  IF DISPENSED, WEAR SURGICAL SHOE/BOOT AT ALL TIMES   IF PRESCRIBED ORAL ANTIBIOTICS, TAKE ALL MEDICATION AS PRESCRIBED UNTIL ALL ARE GONE   1. KEEP right foot  FOOT DRY AT ALL TIMES!!!!  2. CLEANSE ULCER WITH SALINE.  3. DAB DRY WITH GAUZE SPONGE.  4. APPLY A LIGHT AMOUNT OF Gentamicin Cream TO right foot ULCER.  5. APPLY FABRIC BAND-AID AS INSTRUCTED.  6. WEAR SURGICAL SHOE/BOOT DAILY AT ALL TIMES. IF SUPPLIED, WEAR HEEL PROTECTORS AT ALL TIMES WHEN IN BED.  7. DO NOT WALK BAREFOOT!!!  8.  IF YOU EXPERIENCE ANY FEVER, CHILLS, NIGHTSWEATS, NAUSEA OR VOMITING, ELEVATED OR LOW BLOOD SUGARS, REPORT TO EMERGENCY ROOM.  9. IF YOU EXPERIENCE INCREASED REDNESS, PAIN, SWELLING, DISCOLORATION, ODOR, PUS, DRAINAGE OR WARMTH OF YOUR FOOT, REPORT TO EMERGENCY ROOM.

## 2020-06-06 NOTE — Progress Notes (Signed)
Subjective: Patient presents today with diabetes and cc of painful, discolored, thick toenails which interfere with daily activities. Pain is aggravated when wearing enclosed shoe gear. Pain is getting progressively worse and relieved with periodic professional debridement. He c/o interdigital corn between 4th and 5th digit right foot. He attempted to trim corn about 2-3 weeks ago and cut himself. Went to see PCP and right foot was swollen. Pt states PCP instructed him to wait until swelling goes down States he did not take any antibiotics.  Pa, [facility] is his PCP; last seen 2-3 weeks ago per patient recall.  He denies any fever, chills, night sweats, nausea or vomiting.  Current Outpatient Medications on File Prior to Visit  Medication Sig Dispense Refill  . acetaminophen (TYLENOL) 325 MG tablet Take 650 mg by mouth every 6 (six) hours as needed for mild pain or headache.    . albuterol (VENTOLIN HFA) 108 (90 Base) MCG/ACT inhaler Inhale 2 puffs into the lungs at bedtime.     . chlordiazePOXIDE (LIBRIUM) 25 MG capsule 50mg  PO TID x 1D, then 25-50mg  PO BID X 1D, then 25-50mg  PO QD X 1D 10 capsule 0  . diclofenac (VOLTAREN) 75 MG EC tablet Take 75 mg by mouth 2 (two) times daily as needed for mild pain.     . fluticasone (FLONASE) 50 MCG/ACT nasal spray Place 1 spray into both nostrils daily. (Patient taking differently: Place 1 spray into both nostrils daily as needed for allergies. ) 16 g 2  . methocarbamol (ROBAXIN) 500 MG tablet Take 500 mg by mouth 2 (two) times daily as needed for muscle spasms.     . Multiple Vitamin (MULTIVITAMIN WITH MINERALS) TABS tablet Take 1 tablet by mouth daily. 30 tablet 0  . polyvinyl alcohol (LIQUIFILM TEARS) 1.4 % ophthalmic solution Place 1 drop into both eyes as needed for dry eyes.    SPIRIVA HANDIHALER 18 MCG inhalation capsule     . SPIRIVA RESPIMAT 2.5 MCG/ACT AERS Inhale 2 sprays into the lungs every other day.   5  . traMADol (ULTRAM) 50 MG tablet  Take 1 tablet (50 mg total) by mouth every 6 (six) hours as needed. 15 tablet 0  . triamcinolone cream (KENALOG) 0.1 % Apply 1 application topically 2 (two) times daily. 30 g 0  . [DISCONTINUED] ferrous sulfate 325 (65 FE) MG tablet Take 1 tablet (325 mg total) by mouth 2 (two) times daily with a meal. (Patient not taking: Reported on 02/12/2020) 60 tablet 0  . [DISCONTINUED] ipratropium-albuterol (DUONEB) 0.5-2.5 (3) MG/3ML SOLN Take 3 mLs by nebulization every 6 (six) hours as needed. (Patient not taking: Reported on 02/12/2020) 360 mL 1  . [DISCONTINUED] zolpidem (AMBIEN) 5 MG tablet Take 1 tablet (5 mg total) by mouth at bedtime as needed for sleep. (Patient not taking: Reported on 02/12/2020) 10 tablet 0   No current facility-administered medications on file prior to visit.     No Known Allergies   Objective: Vitals:   06/06/20 0916  Temp: (!) 97.3 F (36.3 C)    Frederick [name] is a/an 59 y.o. male WD, WN in NAD.46 AAO X 3.  Vascular Examination: Capillary refill time to digits immediate b/l. Palpable pedal pulses b/l LE. Pedal hair sparse. Lower extremity skin temperature gradient within normal limits. No pain with calf compression b/l.  Dermatological Examination: Pedal skin with normal turgor, texture and tone bilaterally. No open wounds bilaterally. No interdigital macerations bilaterally. Toenails 1-5 b/l elongated, discolored, dystrophic, thickened,  crumbly with subungual debris and tenderness to dorsal palpation.    Wound Location: 4th webspace right foot. There is a moderate amount of devitalized tissue present in the wound. Predebridement Wound Measurement:  Unable to measure Postdebridement Wound Measurement: pinpoint opening to medial aspect of right 5th digit Wound Base: Mixed Granular/Fibrotic Peri-wound: Macerated Exudate: None: wound tissue dry Blood Loss during debridement: 0 cc('s). Description of tissue removed from ulceration today:  Macerated tissue. Sign(s) of  clinical bacterial infection: no clinical signs of infection noted on examination today. Material in wound which inhibits healing/promotes adjacent tissue breakdown:  Moderate macerated tissue There is no purulent drainage expressed, but there is pain on dorsal to plantar palpation of webspace.   Musculoskeletal Examination: Normal muscle strength 5/5 to all lower extremity muscle groups bilaterally. No pain crepitus or joint limitation noted with ROM b/l. Hammertoes noted to the R 4th toe and R 5th toe. Utilizes walker for ambulation assistance.  Neurological Examination: Protective sensation intact 5/5 intact bilaterally with 10g monofilament b/l. Vibratory sensation intact b/l. Clonus negative b/l.  Xray findings right foot: No gas in tissues and no bone erosion noted at location of ulceration.  Hardware fixation remains in place right ankle. Right 5th digit head of proximal phalanx and right 4th digit base of middle phalanx consistent with location of 5th digit wound. HAV with bunion deformity noted.  Assessment: 1. Pain due to onychomycosis of toenails of both feet   2. Ulcer of toe of right foot, limited to breakdown of skin (HCC)   3. Right foot pain   4. Acquired hammertoes of both feet   5. Hallux abductovalgus with bunions, right    Plan: -Patient was evaluated and treated and all questions answered.  -Patient/POA/Family member educated on diagnosis and treatment plan of routine ulcer debridement/wound care.  -Ulceration debridement achieved utilizing sharp debridement with sterile scalpel blade.. Type/amount of devitalized tissue removed:moderate macerated hyperkeratosis.  -Today's ulcer size post-debridement: pinpoint ulceration medial aspect of 5th digit. -Ulceration cleansed with wound cleanser.  Light dressing applied to base of ulceration and secured with light dressing. -Wound responded well to today's debridement. -Patient risk factors affecting healing of  ulcer: -Frequency of debridement needed to achieve healing:  -[name] given written instructions on daily wound care for Gentamicin Cream to be applied to 4th webspace webspace right foot. ulceration. -Rx for Doxycyline 100 mg, #20, to be taken twice daily for 10 days.. -Radiology ordered today: Xray, 3 views, taken and reviewed in office with Mr. Marte. -I did discuss simple hammertoe procedure with him to alleviate interdigital corn formation. Explained I could refer him back to Dr. Samuella Cota if he is interested in having this procedure performed. -Recommended he wear open-toed shoes for now to prevent pressure of toes rubbing together. -Toenails 1-5 b/l were debrided in length and girth with sterile nail nippers and dremel without iatrogenic bleeding.  -Patient to report any pedal injuries to medical professional immediately. -Patient was given written instructions on offloading of ulceration and daily dressing changes. Strict orders were given to keep foot dry.  -Patient instructed to report to emergency department with worsening appearance of ulcer/toe/foot, increased pain, foul odor, increased redness, swelling, drainage, fever, chills, nightsweats, nausea, vomiting, increased blood sugar.   Return in about 1 week (around 06/13/2020) for follow up ulcer RIGHT  4TH WEBSPACE. Freddie Breech, DPM

## 2020-06-16 ENCOUNTER — Ambulatory Visit: Payer: Medicare Other | Admitting: Podiatry

## 2020-06-18 ENCOUNTER — Ambulatory Visit: Payer: Medicare Other | Admitting: Podiatry

## 2020-07-14 ENCOUNTER — Ambulatory Visit (INDEPENDENT_AMBULATORY_CARE_PROVIDER_SITE_OTHER): Payer: Medicare Other | Admitting: Podiatry

## 2020-07-14 ENCOUNTER — Other Ambulatory Visit: Payer: Self-pay

## 2020-07-14 DIAGNOSIS — B353 Tinea pedis: Secondary | ICD-10-CM | POA: Diagnosis not present

## 2020-07-14 DIAGNOSIS — L84 Corns and callosities: Secondary | ICD-10-CM

## 2020-07-14 DIAGNOSIS — L97511 Non-pressure chronic ulcer of other part of right foot limited to breakdown of skin: Secondary | ICD-10-CM

## 2020-07-14 MED ORDER — NAFTIFINE HCL 2 % EX GEL: CUTANEOUS | 2 refills | Status: DC

## 2020-07-14 NOTE — Progress Notes (Signed)
    Subjective:  Patient ID: [name], male    DOB: 03-25-1961,  MRN: [id number]  Chief Complaint  Patient presents with  . Wound Check    R interdigital 4th-5th. Pt stated, "It seems to be the same. Pain = 9/10". No bleeding/odor/drainage/fever/chills/N&V.    59 y.o. male presents with the above complaint. History confirmed with patient.   Objective:  Physical Exam: warm, good capillary refill, normal DP and PT pulses, normal sensory exam and ulceration at interdigital sulcus right 4th interspace, limited to skin breakdown, maceration with tinea pedis  Assessment:   1. Tinea pedis, right   2. Heloma molle   3. Ulcer of right foot limited to breakdown of skin (HCC)      Plan:  Patient was evaluated and treated and all questions answered.  -Discussed with him this due to his hammertoes and that the area the toe stays moist and he has developed tinea pedis here -Naftifine gel was prescribed for him -Hammertoe correction could help to alleviate the heloma molle formation, however I prefer to avoid this due to his pack per day smoking history   Return in about 3 weeks (around 08/04/2020).

## 2020-08-05 NOTE — Progress Notes (Deleted)
NEUROLOGY CONSULTATION NOTE  RUSSELL QUINNEY MRN: [id number] DOB: [birth year]  Referring provider: Doneen Poisson, MD Primary care provider: [facility]  Reason for consult:  Gait disturbance  HISTORY OF PRESENT ILLNESS: Frederick [name] is a 59 year old ***-handed male with alcohol dependence and arthritis who presents for gait disturbance.  History supplemented by ED and referring provider's notes.  He reports weakness in the lower extremities ***.  He has bilateral chronic knee pain secondary to ***.  X-rays of his knees from January showed status post IM nailing in left tibia fracture and traumatic changes of left fibular shaft and traumatic changes of proximal right fibular shaft but no acute abnormalities . X-ray of left hip from May showed mild degenerative changes of the joint but no acute abnormality.   X-ray of left ankle from May showed fracture of the distal tibial fixation screws and healed fracture in the tibia and fibula.  He has alcohol dependence and has been seen in the ED several times this past year for alcohol intoxication.  Remote CT head from April 2013 personally reviewed showed mild chronic small vessel ischemic changes with chronic lacunar infarct in the right caudate.    PAST MEDICAL HISTORY: Past Medical History:  Diagnosis Date  . Arthritis   . Asthma   . EtOH dependence (HCC)    drinks 2-3 40s a day  . Fibula fracture    Dr Dion Saucier 02-2011  . Hernia    surgery 01-17-13  . Hypertension   . Tuberculosis     PAST SURGICAL HISTORY: Past Surgical History:  Procedure Laterality Date  . ANKLE SURGERY    . INGUINAL HERNIA REPAIR Right 01/19/2013   Procedure: HERNIA REPAIR INGUINAL ADULT;  Surgeon: [name], MD,FACS;  Location: [facility] OR;  Service: General;  Laterality: Right;  . TIBIA IM NAIL INSERTION Left 01/22/2013   Procedure: INTRAMEDULLARY (IM) NAIL TIBIAL;  Surgeon: [name], MD;  Location: [facility] OR;  Service: Orthopedics;  Laterality: Left;     MEDICATIONS: Current Outpatient Medications on File Prior to Visit  Medication Sig Dispense Refill  . acetaminophen (TYLENOL) 325 MG tablet Take 650 mg by mouth every 6 (six) hours as needed for mild pain or headache.    . albuterol (VENTOLIN HFA) 108 (90 Base) MCG/ACT inhaler Inhale 2 puffs into the lungs at bedtime.     . chlordiazePOXIDE (LIBRIUM) 25 MG capsule 50mg  PO TID x 1D, then 25-50mg  PO BID X 1D, then 25-50mg  PO QD X 1D 10 capsule 0  . diclofenac (VOLTAREN) 75 MG EC tablet Take 75 mg by mouth 2 (two) times daily as needed for mild pain.     . fluticasone (FLONASE) 50 MCG/ACT nasal spray Place 1 spray into both nostrils daily. (Patient taking differently: Place 1 spray into both nostrils daily as needed for allergies. ) 16 g 2  . gentamicin cream (GARAMYCIN) 0.1 % Apply between right 4th and 5th toes once daily 30 g 0  . methocarbamol (ROBAXIN) 500 MG tablet Take 500 mg by mouth 2 (two) times daily as needed for muscle spasms.     . Multiple Vitamin (MULTIVITAMIN WITH MINERALS) TABS tablet Take 1 tablet by mouth daily. 30 tablet 0  . Naftifine HCl 2 % GEL Apply one application daily between affected toes after bathing and drying well, let dry before going into socks/shoes 45 g 2  . polyvinyl alcohol (LIQUIFILM TEARS) 1.4 % ophthalmic solution Place 1 drop into both eyes as needed for  dry eyes.    Marland Kitchen SPIRIVA HANDIHALER 18 MCG inhalation capsule     . SPIRIVA RESPIMAT 2.5 MCG/ACT AERS Inhale 2 sprays into the lungs every other day.   5  . traMADol (ULTRAM) 50 MG tablet Take 1 tablet (50 mg total) by mouth every 6 (six) hours as needed. 15 tablet 0  . triamcinolone cream (KENALOG) 0.1 % Apply 1 application topically 2 (two) times daily. 30 g 0  . [DISCONTINUED] ferrous sulfate 325 (65 FE) MG tablet Take 1 tablet (325 mg total) by mouth 2 (two) times daily with a meal. (Patient not taking: Reported on 02/12/2020) 60 tablet 0  . [DISCONTINUED] ipratropium-albuterol (DUONEB) 0.5-2.5 (3)  MG/3ML SOLN Take 3 mLs by nebulization every 6 (six) hours as needed. (Patient not taking: Reported on 02/12/2020) 360 mL 1  . [DISCONTINUED] zolpidem (AMBIEN) 5 MG tablet Take 1 tablet (5 mg total) by mouth at bedtime as needed for sleep. (Patient not taking: Reported on 02/12/2020) 10 tablet 0   No current facility-administered medications on file prior to visit.    ALLERGIES: No Known Allergies  FAMILY HISTORY: Family History  Problem Relation Age of Onset  . Diabetes Mother   . Diabetes Father   . Hypertension Father    ***.  SOCIAL HISTORY: Social History   Socioeconomic History  . Marital status: Married    Spouse name: Not on file  . Number of children: Not on file  . Years of education: Not on file  . Highest education level: Not on file  Occupational History  . Not on file  Tobacco Use  . Smoking status: Current Every Day Smoker    Packs/day: 0.50    Types: Cigarettes  . Smokeless tobacco: Never Used  . Tobacco comment: 1 ppd  Substance and Sexual Activity  . Alcohol use: Yes    Alcohol/week: 14.0 standard drinks    Types: 14 Cans of beer per week    Comment: occasionally; generally drinks (2) 40s/day  . Drug use: No  . Sexual activity: Never  Other Topics Concern  . Not on file  Social History Narrative  . Not on file   Social Determinants of Health   Financial Resource Strain:   . Difficulty of Paying Living Expenses: Not on file  Food Insecurity:   . Worried About Programme researcher, broadcasting/film/video in the Last Year: Not on file  . Ran Out of Food in the Last Year: Not on file  Transportation Needs:   . Lack of Transportation (Medical): Not on file  . Lack of Transportation (Non-Medical): Not on file  Physical Activity:   . Days of Exercise per Week: Not on file  . Minutes of Exercise per Session: Not on file  Stress:   . Feeling of Stress : Not on file  Social Connections:   . Frequency of Communication with Friends and Family: Not on file  . Frequency of  Social Gatherings with Friends and Family: Not on file  . Attends Religious Services: Not on file  . Active Member of Clubs or Organizations: Not on file  . Attends Banker Meetings: Not on file  . Marital Status: Not on file  Intimate Partner Violence:   . Fear of Current or Ex-Partner: Not on file  . Emotionally Abused: Not on file  . Physically Abused: Not on file  . Sexually Abused: Not on file    REVIEW OF SYSTEMS: Constitutional: No fevers, chills, or sweats, no generalized fatigue, change in  appetite Eyes: No visual changes, double vision, eye pain Ear, nose and throat: No hearing loss, ear pain, nasal congestion, sore throat Cardiovascular: No chest pain, palpitations Respiratory:  No shortness of breath at rest or with exertion, wheezes GastrointestinaI: No nausea, vomiting, diarrhea, abdominal pain, fecal incontinence Genitourinary:  No dysuria, urinary retention or frequency Musculoskeletal:  No neck pain, back pain Integumentary: No rash, pruritus, skin lesions Neurological: as above Psychiatric: No depression, insomnia, anxiety Endocrine: No palpitations, fatigue, diaphoresis, mood swings, change in appetite, change in weight, increased thirst Hematologic/Lymphatic:  No purpura, petechiae. Allergic/Immunologic: no itchy/runny eyes, nasal congestion, recent allergic reactions, rashes  PHYSICAL EXAM: *** General: No acute distress.  Patient appears ***-groomed.  *** Head:  Normocephalic/atraumatic Eyes:  fundi examined but not visualized Neck: supple, no paraspinal tenderness, full range of motion Back: No paraspinal tenderness Heart: regular rate and rhythm Lungs: Clear to auscultation bilaterally. Vascular: No carotid bruits. Neurological Exam: Mental status: alert and oriented to person, place, and time, recent and remote memory intact, fund of knowledge intact, attention and concentration intact, speech fluent and not dysarthric, language  intact. Cranial nerves: CN I: not tested CN II: pupils equal, round and reactive to light, visual fields intact CN III, IV, VI:  full range of motion, no nystagmus, no ptosis CN V: facial sensation intact CN VII: upper and lower face symmetric CN VIII: hearing intact CN IX, X: gag intact, uvula midline CN XI: sternocleidomastoid and trapezius muscles intact CN XII: tongue midline Bulk & Tone: normal, no fasciculations. Motor:  5/5 throughout *** Sensation:  Pinprick *** temperature *** and vibration sensation intact.  ***. Deep Tendon Reflexes:  2+ throughout, *** toes downgoing.  *** Finger to nose testing:  Without dysmetria.  *** Heel to shin:  Without dysmetria.  *** Gait:  Normal station and stride.  Able to turn and tandem walk. Romberg ***.  IMPRESSION: ***  PLAN: ***  Thank you for allowing me to take part in the care of this patient.  Shon Millet, DO  CC: ***

## 2020-08-06 ENCOUNTER — Ambulatory Visit: Payer: Medicare Other | Admitting: Neurology

## 2020-08-07 ENCOUNTER — Ambulatory Visit: Payer: Medicare Other | Admitting: Podiatry

## 2020-11-05 DIAGNOSIS — J449 Chronic obstructive pulmonary disease, unspecified: Secondary | ICD-10-CM | POA: Diagnosis not present

## 2020-11-05 DIAGNOSIS — R0902 Hypoxemia: Secondary | ICD-10-CM | POA: Diagnosis not present

## 2020-11-29 DIAGNOSIS — J449 Chronic obstructive pulmonary disease, unspecified: Secondary | ICD-10-CM | POA: Diagnosis not present

## 2020-11-29 DIAGNOSIS — M545 Low back pain, unspecified: Secondary | ICD-10-CM | POA: Diagnosis not present

## 2020-11-29 DIAGNOSIS — M170 Bilateral primary osteoarthritis of knee: Secondary | ICD-10-CM | POA: Diagnosis not present

## 2020-11-29 DIAGNOSIS — R296 Repeated falls: Secondary | ICD-10-CM | POA: Diagnosis not present

## 2020-12-06 DIAGNOSIS — J449 Chronic obstructive pulmonary disease, unspecified: Secondary | ICD-10-CM | POA: Diagnosis not present

## 2020-12-06 DIAGNOSIS — R0902 Hypoxemia: Secondary | ICD-10-CM | POA: Diagnosis not present

## 2020-12-30 DIAGNOSIS — J449 Chronic obstructive pulmonary disease, unspecified: Secondary | ICD-10-CM | POA: Diagnosis not present

## 2020-12-30 DIAGNOSIS — M545 Low back pain, unspecified: Secondary | ICD-10-CM | POA: Diagnosis not present

## 2020-12-30 DIAGNOSIS — M170 Bilateral primary osteoarthritis of knee: Secondary | ICD-10-CM | POA: Diagnosis not present

## 2020-12-30 DIAGNOSIS — R296 Repeated falls: Secondary | ICD-10-CM | POA: Diagnosis not present

## 2021-01-06 DIAGNOSIS — J449 Chronic obstructive pulmonary disease, unspecified: Secondary | ICD-10-CM | POA: Diagnosis not present

## 2021-01-06 DIAGNOSIS — R0902 Hypoxemia: Secondary | ICD-10-CM | POA: Diagnosis not present

## 2021-01-30 DIAGNOSIS — J449 Chronic obstructive pulmonary disease, unspecified: Secondary | ICD-10-CM | POA: Diagnosis not present

## 2021-01-30 DIAGNOSIS — M545 Low back pain, unspecified: Secondary | ICD-10-CM | POA: Diagnosis not present

## 2021-01-30 DIAGNOSIS — R296 Repeated falls: Secondary | ICD-10-CM | POA: Diagnosis not present

## 2021-01-30 DIAGNOSIS — M170 Bilateral primary osteoarthritis of knee: Secondary | ICD-10-CM | POA: Diagnosis not present

## 2021-02-03 DIAGNOSIS — J449 Chronic obstructive pulmonary disease, unspecified: Secondary | ICD-10-CM | POA: Diagnosis not present

## 2021-02-03 DIAGNOSIS — R0902 Hypoxemia: Secondary | ICD-10-CM | POA: Diagnosis not present

## 2021-02-19 ENCOUNTER — Other Ambulatory Visit: Payer: Self-pay

## 2021-02-19 ENCOUNTER — Ambulatory Visit (HOSPITAL_COMMUNITY)
Admission: EM | Admit: 2021-02-19 | Discharge: 2021-02-19 | Disposition: A | Discharge status: Home / Self Care | Payer: Medicare Other | Attending: Emergency Medicine | Admitting: Emergency Medicine

## 2021-02-19 ENCOUNTER — Encounter (HOSPITAL_COMMUNITY): Payer: Self-pay | Admitting: Emergency Medicine

## 2021-02-19 DIAGNOSIS — S60419A Abrasion of unspecified finger, initial encounter: Secondary | ICD-10-CM

## 2021-02-19 DIAGNOSIS — L03011 Cellulitis of right finger: Secondary | ICD-10-CM

## 2021-02-19 DIAGNOSIS — M79644 Pain in right finger(s): Secondary | ICD-10-CM

## 2021-02-19 DIAGNOSIS — L089 Local infection of the skin and subcutaneous tissue, unspecified: Secondary | ICD-10-CM

## 2021-02-19 MED ORDER — CEPHALEXIN 500 MG PO CAPS: 500.0000 mg | ORAL_CAPSULE | Freq: Three times a day (TID) | ORAL | 0 refills | Status: AC

## 2021-02-19 NOTE — ED Triage Notes (Signed)
Pt presents today with c/o of right index finger pain x 1 month. Denies injury.

## 2021-02-19 NOTE — Discharge Instructions (Signed)
Take the antibiotic as directed.  Follow up with your primary care provider if your symptoms are not improving.     

## 2021-02-19 NOTE — ED Provider Notes (Signed)
[facility]    CSN: [id number] Arrival date & time: 02/19/21  1310      History   Chief Complaint Chief Complaint  Patient presents with  . Hand Pain    Rt index    HPI Frederick [name] is a 60 y.o. male.   Patient presents with pain from a sore on the tip of his right index finger x1 month.  He does not know how the injury occurred.  He denies weakness, numbness, paresthesias, drainage, fever, chills, or other symptoms.  No treatments attempted at home.  His medical history includes hypertension, asthma, tuberculosis, protein calorie malnutrition, small bowel obstruction, EtOH dependence, marijuana abuse, cigarette smoker.  The history is provided by the patient and medical records.    Past Medical History:  Diagnosis Date  . Arthritis   . Asthma   . EtOH dependence (HCC)    drinks 2-3 40s a day  . Fibula fracture    Dr Dion Saucier 02-2011  . Hernia    surgery 01-17-13  . Hypertension   . Tuberculosis     Patient Active Problem List   Diagnosis Date Noted  . Marijuana abuse 07/22/2017  . Hyponatremia 12/03/2015  . Left rib fracture 11/29/2015  . Microcytic anemia 11/29/2015  . Cigarette smoker 11/29/2015  . Protein-calorie malnutrition, severe (HCC) 11/29/2015  . Unintentional weight loss 11/29/2015  . History of tuberculosis 11/28/2015  . Cavitary lesion of lung 11/28/2015  . SBO (small bowel obstruction) (HCC) 03/10/2013  . Abdominal pain, diffuse 03/10/2013  . Nausea and vomiting 03/10/2013  . Fracture of fibula, proximal 01/21/2013  . Closed fracture of left distal distal tibia 01/21/2013  . EtOH dependence (HCC)   . Fibula fracture   . Hernia     Past Surgical History:  Procedure Laterality Date  . ANKLE SURGERY    . INGUINAL HERNIA REPAIR Right 01/19/2013   Procedure: HERNIA REPAIR INGUINAL ADULT;  Surgeon: [name], MD,FACS;  Location: [facility] OR;  Service: General;  Laterality: Right;  . TIBIA IM NAIL INSERTION Left 01/22/2013   Procedure:  INTRAMEDULLARY (IM) NAIL TIBIAL;  Surgeon: [name], MD;  Location: [facility] OR;  Service: Orthopedics;  Laterality: Left;       Home Medications    Prior to Admission medications   Medication Sig Start Date End Date Taking? Authorizing Provider  acetaminophen (TYLENOL) 325 MG tablet Take 650 mg by mouth every 6 (six) hours as needed for mild pain or headache.   Yes [provider]  albuterol (VENTOLIN HFA) 108 (90 Base) MCG/ACT inhaler Inhale 2 puffs into the lungs at bedtime. 12/06/19  Yes [provider]  cephALEXin (KEFLEX) 500 MG capsule Take 1 capsule (500 mg total) by mouth 3 (three) times daily for 7 days. 02/19/21 02/26/21 Yes Mickie Bail, NP  diclofenac (VOLTAREN) 75 MG EC tablet Take 75 mg by mouth 2 (two) times daily as needed for mild pain.  04/03/20  Yes [provider]  methocarbamol (ROBAXIN) 500 MG tablet Take 500 mg by mouth 2 (two) times daily as needed for muscle spasms.  04/03/20  Yes [provider]  Multiple Vitamin (MULTIVITAMIN WITH MINERALS) TABS tablet Take 1 tablet by mouth daily. 05/31/19  Yes Horton, Mayer Masker, MD  Naftifine HCl 2 % GEL Apply one application daily between affected toes after bathing and drying well, let dry before going into socks/shoes 07/14/20  Yes McDonald, Rachelle Hora, DPM  polyvinyl alcohol (LIQUIFILM TEARS) 1.4 % ophthalmic solution Place  1 drop into both eyes as needed for dry eyes.   Yes [provider]  SPIRIVA HANDIHALER 18 MCG inhalation capsule  04/21/20  Yes [provider]  SPIRIVA RESPIMAT 2.5 MCG/ACT AERS Inhale 2 sprays into the lungs every other day.  11/24/15  Yes [provider]  traMADol (ULTRAM) 50 MG tablet Take 1 tablet (50 mg total) by mouth every 6 (six) hours as needed. 05/06/20  Yes Moshe Cipro, NP  triamcinolone cream (KENALOG) 0.1 % Apply 1 application topically 2 (two) times daily. 05/06/20  Yes Moshe Cipro, NP  chlordiazePOXIDE (LIBRIUM) 25 MG capsule  50mg  PO TID x 1D, then 25-50mg  PO BID X 1D, then 25-50mg  PO QD X 1D 04/17/20   Geiple, 04/19/20, PA-C  fluticasone (FLONASE) 50 MCG/ACT nasal spray Place 1 spray into both nostrils daily. Patient taking differently: Place 1 spray into both nostrils daily as needed for allergies. 07/28/15   Hedges, 07/30/15, PA-C  gentamicin cream (GARAMYCIN) 0.1 % Apply between right 4th and 5th toes once daily 06/06/20   08/07/20, DPM  ferrous sulfate 325 (65 FE) MG tablet Take 1 tablet (325 mg total) by mouth 2 (two) times daily with a meal. Patient not taking: Reported on 02/12/2020 12/03/15 04/17/20  04/19/20, MD  ipratropium-albuterol (DUONEB) 0.5-2.5 (3) MG/3ML SOLN Take 3 mLs by nebulization every 6 (six) hours as needed. Patient not taking: Reported on 02/12/2020 12/02/15 04/17/20  04/19/20, MD  zolpidem (AMBIEN) 5 MG tablet Take 1 tablet (5 mg total) by mouth at bedtime as needed for sleep. Patient not taking: Reported on 02/12/2020 12/02/15 04/17/20  04/19/20, MD    Family History Family History  Problem Relation Age of Onset  . Diabetes Mother   . Diabetes Father   . Hypertension Father     Social History Social History   Tobacco Use  . Smoking status: Current Every Day Smoker    Packs/day: 0.50    Types: Cigarettes  . Smokeless tobacco: Never Used  . Tobacco comment: 1 ppd  Substance Use Topics  . Alcohol use: Yes    Alcohol/week: 14.0 standard drinks    Types: 14 Cans of beer per week    Comment: occasionally; generally drinks (2) 40s/day  . Drug use: No     Allergies   Patient has no known allergies.   Review of Systems Review of Systems  Constitutional: Negative for chills and fever.  HENT: Negative for ear pain and sore throat.   Eyes: Negative for pain and visual disturbance.  Respiratory: Negative for cough and shortness of breath.   Cardiovascular: Negative for chest pain and palpitations.  Gastrointestinal: Negative for abdominal pain and vomiting.   Genitourinary: Negative for dysuria and hematuria.  Musculoskeletal: Negative for arthralgias and back pain.  Skin: Positive for wound. Negative for color change and rash.  Neurological: Negative for syncope, weakness and numbness.  All other systems reviewed and are negative.    Physical Exam Triage Vital Signs ED Triage Vitals  Enc Vitals Group     BP      Pulse      Resp      Temp      Temp src      SpO2      Weight      Height      Head Circumference      Peak Flow      Pain Score      Pain Loc  Pain Edu?      Excl. in GC?    No data found.  Updated Vital Signs BP (!) 138/91 (BP Location: Left Arm)   Pulse 62   Temp (!) 97.1 F (36.2 C) (Oral)   Resp 20   SpO2 98%   Visual Acuity Right Eye Distance:   Left Eye Distance:   Bilateral Distance:    Right Eye Near:   Left Eye Near:    Bilateral Near:     Physical Exam Vitals and nursing note reviewed.  Constitutional:      General: He is not in acute distress.    Appearance: He is well-developed.  HENT:     Head: Normocephalic and atraumatic.     Mouth/Throat:     Mouth: Mucous membranes are moist.  Eyes:     Conjunctiva/sclera: Conjunctivae normal.  Cardiovascular:     Rate and Rhythm: Normal rate and regular rhythm.     Heart sounds: Normal heart sounds.  Pulmonary:     Effort: Pulmonary effort is normal. No respiratory distress.     Breath sounds: Normal breath sounds.  Abdominal:     Palpations: Abdomen is soft.     Tenderness: There is no abdominal tenderness.  Musculoskeletal:        General: Tenderness present. No swelling or deformity. Normal range of motion.     Cervical back: Neck supple.     Comments: Right index finger: Strength 5/5, sensation intact, brisk capillary refill.  Tender healing wound on tip of finger with mild localized erythema.  No drainage.  Skin:    General: Skin is warm and dry.     Capillary Refill: Capillary refill takes less than 2 seconds.     Findings:  Lesion present.  Neurological:     Mental Status: He is alert. Mental status is at baseline.     Sensory: No sensory deficit.     Motor: No weakness.     Comments: In wheelchair.  Psychiatric:        Mood and Affect: Mood normal.        Behavior: Behavior normal.          UC Treatments / Results  Labs (all labs ordered are listed, but only abnormal results are displayed) Labs Reviewed - No data to display  EKG   Radiology No results found.  Procedures Procedures (including critical care time)  Medications Ordered in UC Medications - No data to display  Initial Impression / Assessment and Plan / UC Course  I have reviewed the triage vital signs and the nursing notes.  Pertinent labs & imaging results that were available during my care of the patient were reviewed by me and considered in my medical decision making (see chart for details).   Pain of right index finger due to cellulitis.  Wound appears to be healing but has slight localized erythema.  Treating with Keflex.  Wound care instructions and signs of worsening infection discussed.  Instructed patient to follow-up with his PCP if his symptoms are not improving.  He agrees to plan of care.   Final Clinical Impressions(s) / UC Diagnoses   Final diagnoses:  Pain of finger of right hand  Cellulitis of finger of right hand     Discharge Instructions     Take the antibiotic as directed.    Follow up with your primary care provider if your symptoms are not improving.        ED Prescriptions    Medication  Sig Dispense Auth. Provider   cephALEXin (KEFLEX) 500 MG capsule Take 1 capsule (500 mg total) by mouth 3 (three) times daily for 7 days. 21 capsule Mickie Bail, NP     PDMP not reviewed this encounter.   Mickie Bail, NP 02/19/21 1515

## 2021-03-05 ENCOUNTER — Ambulatory Visit (HOSPITAL_COMMUNITY)
Admission: EM | Admit: 2021-03-05 | Discharge: 2021-03-05 | Disposition: A | Discharge status: Home / Self Care | Payer: Medicare Other

## 2021-03-05 ENCOUNTER — Emergency Department (HOSPITAL_COMMUNITY)
Admission: EM | Admit: 2021-03-05 | Discharge: 2021-03-05 | Disposition: A | Discharge status: Home / Self Care | Payer: Medicare Other | Attending: Emergency Medicine | Admitting: Emergency Medicine

## 2021-03-05 ENCOUNTER — Encounter (HOSPITAL_COMMUNITY): Payer: Self-pay | Admitting: Emergency Medicine

## 2021-03-05 ENCOUNTER — Other Ambulatory Visit: Payer: Self-pay

## 2021-03-05 DIAGNOSIS — Z7951 Long term (current) use of inhaled steroids: Secondary | ICD-10-CM | POA: Insufficient documentation

## 2021-03-05 DIAGNOSIS — J45909 Unspecified asthma, uncomplicated: Secondary | ICD-10-CM | POA: Insufficient documentation

## 2021-03-05 DIAGNOSIS — L03011 Cellulitis of right finger: Secondary | ICD-10-CM

## 2021-03-05 DIAGNOSIS — I10 Essential (primary) hypertension: Secondary | ICD-10-CM | POA: Diagnosis not present

## 2021-03-05 DIAGNOSIS — F17210 Nicotine dependence, cigarettes, uncomplicated: Secondary | ICD-10-CM | POA: Diagnosis not present

## 2021-03-05 DIAGNOSIS — L02511 Cutaneous abscess of right hand: Secondary | ICD-10-CM | POA: Diagnosis not present

## 2021-03-05 DIAGNOSIS — L0291 Cutaneous abscess, unspecified: Secondary | ICD-10-CM

## 2021-03-05 DIAGNOSIS — R2231 Localized swelling, mass and lump, right upper limb: Secondary | ICD-10-CM | POA: Diagnosis present

## 2021-03-05 MED ORDER — LIDOCAINE HCL (PF) 1 % IJ SOLN
5.0000 mL | Freq: Once | INTRAMUSCULAR | Status: AC
  Administered 2021-03-05: 5 mL
  Filled 2021-03-05: qty 5

## 2021-03-05 MED ORDER — DOXYCYCLINE HYCLATE 100 MG PO CAPS: 100.0000 mg | ORAL_CAPSULE | Freq: Two times a day (BID) | ORAL | 0 refills | Status: DC

## 2021-03-05 MED ORDER — DOXYCYCLINE HYCLATE 100 MG PO TABS
100.0000 mg | ORAL_TABLET | Freq: Once | ORAL | Status: AC
  Administered 2021-03-05: 100 mg via ORAL
  Filled 2021-03-05: qty 1

## 2021-03-05 NOTE — ED Triage Notes (Addendum)
Pt presents today with continued pain to right 2nd finger. He reports it is swollen and painful. He is unable to sleep. He was seen here for same recently. Denies injury.

## 2021-03-05 NOTE — ED Provider Notes (Signed)
[facility]    CSN: [id number] Arrival date & time: 03/05/21  1027      History   Chief Complaint Chief Complaint  Patient presents with  . Hand Pain    Right 2nd finger    HPI Frederick [name] is a 60 y.o. male.   Patient presents with ongoing and worsening pain, swelling, and redness in the tip of his right index finger.  The pain is described as throbbing, 10/10.  He was seen here on 02/19/2021; diagnosed with pain and cellulitis of his right index finger; treated with Keflex follow-up with his PCP if his symptoms not improving.  He denies numbness, weakness, fever, chills, open wounds, drainage, or other symptoms.  His medical history includes hypertension, asthma, anemia, small bowel obstruction, EtOH dependence, marijuana abuse, unintentional weight loss, protein calorie malnutrition, cigarette smoker.  The history is provided by the patient and medical records.    Past Medical History:  Diagnosis Date  . Arthritis   . Asthma   . EtOH dependence (HCC)    drinks 2-3 40s a day  . Fibula fracture    Dr Dion Saucier 02-2011  . Hernia    surgery 01-17-13  . Hypertension   . Tuberculosis     Patient Active Problem List   Diagnosis Date Noted  . Marijuana abuse 07/22/2017  . Hyponatremia 12/03/2015  . Left rib fracture 11/29/2015  . Microcytic anemia 11/29/2015  . Cigarette smoker 11/29/2015  . Protein-calorie malnutrition, severe (HCC) 11/29/2015  . Unintentional weight loss 11/29/2015  . History of tuberculosis 11/28/2015  . Cavitary lesion of lung 11/28/2015  . SBO (small bowel obstruction) (HCC) 03/10/2013  . Abdominal pain, diffuse 03/10/2013  . Nausea and vomiting 03/10/2013  . Fracture of fibula, proximal 01/21/2013  . Closed fracture of left distal distal tibia 01/21/2013  . EtOH dependence (HCC)   . Fibula fracture   . Hernia     Past Surgical History:  Procedure Laterality Date  . ANKLE SURGERY    . INGUINAL HERNIA REPAIR Right 01/19/2013    Procedure: HERNIA REPAIR INGUINAL ADULT;  Surgeon: [name], MD,FACS;  Location: [facility] OR;  Service: General;  Laterality: Right;  . TIBIA IM NAIL INSERTION Left 01/22/2013   Procedure: INTRAMEDULLARY (IM) NAIL TIBIAL;  Surgeon: [name], MD;  Location: [facility] OR;  Service: Orthopedics;  Laterality: Left;       Home Medications    Prior to Admission medications   Medication Sig Start Date End Date Taking? Authorizing Provider  acetaminophen (TYLENOL) 325 MG tablet Take 650 mg by mouth every 6 (six) hours as needed for mild pain or headache.    [provider]  albuterol (VENTOLIN HFA) 108 (90 Base) MCG/ACT inhaler Inhale 2 puffs into the lungs at bedtime. 12/06/19   [provider]  chlordiazePOXIDE (LIBRIUM) 25 MG capsule 50mg  PO TID x 1D, then 25-50mg  PO BID X 1D, then 25-50mg  PO QD X 1D 04/17/20   04/19/20, PA-C  diclofenac (VOLTAREN) 75 MG EC tablet Take 75 mg by mouth 2 (two) times daily as needed for mild pain.  04/03/20   [provider]  fluticasone (FLONASE) 50 MCG/ACT nasal spray Place 1 spray into both nostrils daily. Patient taking differently: Place 1 spray into both nostrils daily as needed for allergies. 07/28/15   Hedges, 07/30/15, PA-C  gentamicin cream (GARAMYCIN) 0.1 % Apply between right 4th and 5th toes once daily 06/06/20   08/07/20, DPM  methocarbamol (ROBAXIN) 500  MG tablet Take 500 mg by mouth 2 (two) times daily as needed for muscle spasms.  04/03/20   [provider]  Multiple Vitamin (MULTIVITAMIN WITH MINERALS) TABS tablet Take 1 tablet by mouth daily. 05/31/19   Horton, Mayer Masker, MD  Naftifine HCl 2 % GEL Apply one application daily between affected toes after bathing and drying well, let dry before going into socks/shoes 07/14/20   McDonald, Adam R, DPM  polyvinyl alcohol (LIQUIFILM TEARS) 1.4 % ophthalmic solution Place 1 drop into both eyes as needed for dry eyes.    [provider]  SPIRIVA HANDIHALER  18 MCG inhalation capsule  04/21/20   [provider]  SPIRIVA RESPIMAT 2.5 MCG/ACT AERS Inhale 2 sprays into the lungs every other day.  11/24/15   [provider]  traMADol (ULTRAM) 50 MG tablet Take 1 tablet (50 mg total) by mouth every 6 (six) hours as needed. 05/06/20   Moshe Cipro, NP  triamcinolone cream (KENALOG) 0.1 % Apply 1 application topically 2 (two) times daily. 05/06/20   Moshe Cipro, NP  ferrous sulfate 325 (65 FE) MG tablet Take 1 tablet (325 mg total) by mouth 2 (two) times daily with a meal. Patient not taking: Reported on 02/12/2020 12/03/15 04/17/20  Renae Fickle, MD  ipratropium-albuterol (DUONEB) 0.5-2.5 (3) MG/3ML SOLN Take 3 mLs by nebulization every 6 (six) hours as needed. Patient not taking: Reported on 02/12/2020 12/02/15 04/17/20  Kathlen Mody, MD  zolpidem (AMBIEN) 5 MG tablet Take 1 tablet (5 mg total) by mouth at bedtime as needed for sleep. Patient not taking: Reported on 02/12/2020 12/02/15 04/17/20  Kathlen Mody, MD    Family History Family History  Problem Relation Age of Onset  . Diabetes Mother   . Diabetes Father   . Hypertension Father     Social History Social History   Tobacco Use  . Smoking status: Current Every Day Smoker    Packs/day: 0.50    Types: Cigarettes  . Smokeless tobacco: Never Used  . Tobacco comment: 1 ppd  Substance Use Topics  . Alcohol use: Yes    Alcohol/week: 14.0 standard drinks    Types: 14 Cans of beer per week    Comment: occasionally; generally drinks (2) 40s/day  . Drug use: No     Allergies   Patient has no known allergies.   Review of Systems Review of Systems  Constitutional: Negative for chills and fever.  HENT: Negative for ear pain and sore throat.   Eyes: Negative for pain and visual disturbance.  Respiratory: Negative for cough and shortness of breath.   Cardiovascular: Negative for chest pain and palpitations.  Gastrointestinal: Negative for abdominal pain and  vomiting.  Genitourinary: Negative for dysuria and hematuria.  Musculoskeletal: Negative for arthralgias and back pain.  Skin: Positive for color change and wound.  Neurological: Negative for syncope, weakness and numbness.  All other systems reviewed and are negative.    Physical Exam Triage Vital Signs ED Triage Vitals  Enc Vitals Group     BP      Pulse      Resp      Temp      Temp src      SpO2      Weight      Height      Head Circumference      Peak Flow      Pain Score      Pain Loc      Pain Edu?  Excl. in GC?    No data found.  Updated Vital Signs BP (!) 148/86 (BP Location: Right Arm)   Pulse (!) 115   Temp 97.9 F (36.6 C) (Oral)   Resp 20   SpO2 100%   Visual Acuity Right Eye Distance:   Left Eye Distance:   Bilateral Distance:    Right Eye Near:   Left Eye Near:    Bilateral Near:     Physical Exam Vitals and nursing note reviewed.  Constitutional:      General: He is not in acute distress.    Appearance: He is well-developed.  HENT:     Head: Normocephalic and atraumatic.     Mouth/Throat:     Mouth: Mucous membranes are moist.  Eyes:     Conjunctiva/sclera: Conjunctivae normal.  Cardiovascular:     Rate and Rhythm: Normal rate and regular rhythm.     Heart sounds: Normal heart sounds.  Pulmonary:     Effort: Pulmonary effort is normal. No respiratory distress.     Breath sounds: Normal breath sounds.  Abdominal:     Palpations: Abdomen is soft.     Tenderness: There is no abdominal tenderness.  Musculoskeletal:        General: Swelling and tenderness present. No deformity. Normal range of motion.     Cervical back: Neck supple.  Skin:    General: Skin is warm and dry.     Capillary Refill: Capillary refill takes less than 2 seconds.     Findings: Erythema and lesion present.  Neurological:     General: No focal deficit present.     Mental Status: He is alert and oriented to person, place, and time.  Psychiatric:         Mood and Affect: Mood normal.        Behavior: Behavior normal.         UC Treatments / Results  Labs (all labs ordered are listed, but only abnormal results are displayed) Labs Reviewed - No data to display  EKG   Radiology No results found.  Procedures Procedures (including critical care time)  Medications Ordered in UC Medications - No data to display  Initial Impression / Assessment and Plan / UC Course  I have reviewed the triage vital signs and the nursing notes.  Pertinent labs & imaging results that were available during my care of the patient were reviewed by me and considered in my medical decision making (see chart for details).   Felon of right index finger.  Patient was recently seen and treated with Keflex; he reports no improvement and the patient's right index finger appears worse with increased erythema, tenderness, edema.  This appears to be a felon.  Sending patient to the ED for evaluation.  He agrees to plan of care.   Final Clinical Impressions(s) / UC Diagnoses   Final diagnoses:  Felon of finger of right hand     Discharge Instructions     Go to the emergency department for evaluation of the infection in your finger.    ED Prescriptions    None     I have reviewed the PDMP during this encounter.   Mickie Bail, NP 03/05/21 [id number]

## 2021-03-05 NOTE — ED Notes (Signed)
Patient is being discharged from the Urgent Care and sent to the Emergency Department via POV. Per Ermalinda Memos, patient is in need of higher level of care due to finger felon. Patient is aware and verbalizes understanding of plan of care.  Vitals:   03/05/21 1040  BP: (!) 148/86  Pulse: (!) 115  Resp: 20  Temp: 97.9 F (36.6 C)  SpO2: 100%

## 2021-03-05 NOTE — ED Triage Notes (Signed)
Pt here for further evaluation of right fingrer infection. Pt states he's been taking keflex for infection but no improvement in symptoms.

## 2021-03-05 NOTE — Discharge Instructions (Addendum)
Take doxycycline as prescribed and complete the full course.  Recommend warm water soaks with gentle range of motion of your finger 3 times daily.  Follow-up with hand Ortho if symptoms are not improving, return to the emergency room at anytime for any worsening or concerning symptoms.

## 2021-03-05 NOTE — Discharge Instructions (Addendum)
Go to the emergency department for evaluation of the infection in your finger.

## 2021-03-05 NOTE — ED Notes (Signed)
Pt verbalizes understanding of discharge instructions. Opportunity for questions and answers were provided. Armband removed by staff, pt discharged from the ED.  

## 2021-03-05 NOTE — ED Provider Notes (Signed)
[facility] EMERGENCY DEPARTMENT Provider Note   CSN: [id number] Arrival date & time: 03/05/21  1111     History Chief Complaint  Patient presents with  . Wound Infection    Frederick [name] is a 60 y.o. male.  60 year old male with history of HTN, additional history as listed below, presents with ongoing right index finger pain, swelling, redness. No pain with movement of his finger. Patient states symptoms started 2 weeks ago without injury, went to UC and was given Keflex. Patient went back to UC today and was sent to the ER for concern for felon. States he is not a diabetic, denies possible retained foreign body/thorn injury. Last td 2021.         Past Medical History:  Diagnosis Date  . Arthritis   . Asthma   . EtOH dependence (HCC)    drinks 2-3 40s a day  . Fibula fracture    Dr Dion Saucier 02-2011  . Hernia    surgery 01-17-13  . Hypertension   . Tuberculosis     Patient Active Problem List   Diagnosis Date Noted  . Marijuana abuse 07/22/2017  . Hyponatremia 12/03/2015  . Left rib fracture 11/29/2015  . Microcytic anemia 11/29/2015  . Cigarette smoker 11/29/2015  . Protein-calorie malnutrition, severe (HCC) 11/29/2015  . Unintentional weight loss 11/29/2015  . History of tuberculosis 11/28/2015  . Cavitary lesion of lung 11/28/2015  . SBO (small bowel obstruction) (HCC) 03/10/2013  . Abdominal pain, diffuse 03/10/2013  . Nausea and vomiting 03/10/2013  . Fracture of fibula, proximal 01/21/2013  . Closed fracture of left distal distal tibia 01/21/2013  . EtOH dependence (HCC)   . Fibula fracture   . Hernia     Past Surgical History:  Procedure Laterality Date  . ANKLE SURGERY    . INGUINAL HERNIA REPAIR Right 01/19/2013   Procedure: HERNIA REPAIR INGUINAL ADULT;  Surgeon: [name], MD,FACS;  Location: [facility];  Service: General;  Laterality: Right;  . TIBIA IM NAIL INSERTION Left 01/22/2013   Procedure: INTRAMEDULLARY (IM) NAIL TIBIAL;   Surgeon: [name], MD;  Location: [facility] OR;  Service: Orthopedics;  Laterality: Left;       Family History  Problem Relation Age of Onset  . Diabetes Mother   . Diabetes Father   . Hypertension Father     Social History   Tobacco Use  . Smoking status: Current Every Day Smoker    Packs/day: 0.50    Types: Cigarettes  . Smokeless tobacco: Never Used  . Tobacco comment: 1 ppd  Substance Use Topics  . Alcohol use: Yes    Alcohol/week: 14.0 standard drinks    Types: 14 Cans of beer per week    Comment: occasionally; generally drinks (2) 40s/day  . Drug use: No    Home Medications Prior to Admission medications   Medication Sig Start Date End Date Taking? Authorizing Provider  doxycycline (VIBRAMYCIN) 100 MG capsule Take 1 capsule (100 mg total) by mouth 2 (two) times daily. 03/05/21  Yes Jeannie Fend, PA-C  acetaminophen (TYLENOL) 325 MG tablet Take 650 mg by mouth every 6 (six) hours as needed for mild pain or headache.    [provider]  albuterol (VENTOLIN HFA) 108 (90 Base) MCG/ACT inhaler Inhale 2 puffs into the lungs at bedtime. 12/06/19   [provider]  chlordiazePOXIDE (LIBRIUM) 25 MG capsule 50mg  PO TID x 1D, then 25-50mg  PO BID X 1D, then 25-50mg  PO  QD X 1D 04/17/20   Renne Crigler, PA-C  diclofenac (VOLTAREN) 75 MG EC tablet Take 75 mg by mouth 2 (two) times daily as needed for mild pain.  04/03/20   [provider]  fluticasone (FLONASE) 50 MCG/ACT nasal spray Place 1 spray into both nostrils daily. Patient taking differently: Place 1 spray into both nostrils daily as needed for allergies. 07/28/15   Hedges, Tinnie Gens, PA-C  gentamicin cream (GARAMYCIN) 0.1 % Apply between right 4th and 5th toes once daily 06/06/20   Freddie Breech, DPM  methocarbamol (ROBAXIN) 500 MG tablet Take 500 mg by mouth 2 (two) times daily as needed for muscle spasms.  04/03/20   [provider]  Multiple Vitamin (MULTIVITAMIN WITH MINERALS) TABS  tablet Take 1 tablet by mouth daily. 05/31/19   Horton, Mayer Masker, MD  Naftifine HCl 2 % GEL Apply one application daily between affected toes after bathing and drying well, let dry before going into socks/shoes 07/14/20   McDonald, Adam R, DPM  polyvinyl alcohol (LIQUIFILM TEARS) 1.4 % ophthalmic solution Place 1 drop into both eyes as needed for dry eyes.    [provider]  SPIRIVA HANDIHALER 18 MCG inhalation capsule  04/21/20   [provider]  SPIRIVA RESPIMAT 2.5 MCG/ACT AERS Inhale 2 sprays into the lungs every other day.  11/24/15   [provider]  traMADol (ULTRAM) 50 MG tablet Take 1 tablet (50 mg total) by mouth every 6 (six) hours as needed. 05/06/20   Moshe Cipro, NP  triamcinolone cream (KENALOG) 0.1 % Apply 1 application topically 2 (two) times daily. 05/06/20   Moshe Cipro, NP  ferrous sulfate 325 (65 FE) MG tablet Take 1 tablet (325 mg total) by mouth 2 (two) times daily with a meal. Patient not taking: Reported on 02/12/2020 12/03/15 04/17/20  Renae Fickle, MD  ipratropium-albuterol (DUONEB) 0.5-2.5 (3) MG/3ML SOLN Take 3 mLs by nebulization every 6 (six) hours as needed. Patient not taking: Reported on 02/12/2020 12/02/15 04/17/20  Kathlen Mody, MD  zolpidem (AMBIEN) 5 MG tablet Take 1 tablet (5 mg total) by mouth at bedtime as needed for sleep. Patient not taking: Reported on 02/12/2020 12/02/15 04/17/20  Kathlen Mody, MD    Allergies    Patient has no known allergies.  Review of Systems   Review of Systems  Constitutional: Negative for fever.  Musculoskeletal: Positive for myalgias.  Skin: Positive for color change. Negative for wound.  Allergic/Immunologic: Negative for immunocompromised state.  Neurological: Negative for weakness and numbness.  Hematological: Negative for adenopathy.    Physical Exam Updated Vital Signs BP (!) 164/114   Pulse (!) 108   Temp 99 F (37.2 C) (Oral)   Resp 18   Ht 6\' 1"  (1.854 m)   Wt 79.4 kg    SpO2 97%   BMI 23.09 kg/m   Physical Exam Vitals and nursing note reviewed.  Constitutional:      General: He is not in acute distress.    Appearance: He is well-developed. He is not diaphoretic.  HENT:     Head: Normocephalic and atraumatic.  Pulmonary:     Effort: Pulmonary effort is normal.  Musculoskeletal:        General: Swelling and tenderness present.     Comments: Right index finger distal phalanx/tip of finger with callus, distal phalanx is slightly swollen, red, no warmth, is tender to the touch without fluctuance or drainage.  Able to fully flex and extend the digit without pain, no pain with  palpation along flexor or extensor tendons.  Skin:    General: Skin is warm and dry.  Neurological:     Mental Status: He is alert and oriented to person, place, and time.     Sensory: No sensory deficit.     Motor: No weakness.  Psychiatric:        Behavior: Behavior normal.     ED Results / Procedures / Treatments   Labs (all labs ordered are listed, but only abnormal results are displayed) Labs Reviewed - No data to display  EKG None  Radiology No results found.  Procedures .Nerve Block  Date/Time: 03/05/2021 12:36 PM Performed by: Jeannie FendMurphy, Beatris Belen A, PA-C Authorized by: Jeannie FendMurphy, Koi Yarbro A, PA-C   Consent:    Consent obtained:  Verbal   Consent given by:  Patient   Risks, benefits, and alternatives were discussed: yes     Risks discussed:  Bleeding, infection, nerve damage, pain, swelling and unsuccessful block   Alternatives discussed:  No treatment Universal protocol:    Patient identity confirmed:  Verbally with patient Indications:    Indications:  Pain relief Location:    Body area:  Upper extremity   Upper extremity nerve blocked: right 2nd finger.   Laterality:  Right Pre-procedure details:    Skin preparation:  Povidone-iodine   Preparation: Patient was prepped and draped in usual sterile fashion   Skin anesthesia:    Skin anesthesia method:   None Procedure details:    Block needle gauge:  25 G   Anesthetic injected:  Lidocaine 1% w/o epi   Steroid injected:  None   Additive injected:  None   Injection procedure:  Anatomic landmarks identified, incremental injection, anatomic landmarks palpated and introduced needle Post-procedure details:    Dressing:  None   Outcome:  Anesthesia achieved   Procedure completion:  Tolerated well, no immediate complications     Medications Ordered in ED Medications  lidocaine (PF) (XYLOCAINE) 1 % injection 5 mL (5 mLs Infiltration Given by Other 03/05/21 1236)  doxycycline (VIBRA-TABS) tablet 100 mg (100 mg Oral Given 03/05/21 1229)    ED Course  I have reviewed the triage vital signs and the nursing notes.  Pertinent labs & imaging results that were available during my care of the patient were reviewed by me and considered in my medical decision making (see chart for details).  Clinical Course as of 03/05/21 1312  Thu Mar 05, 2021  [id number] 60 year old male with infection to tip of right index finger, not improving with Keflex. On exam, callous to tip of right index finger with mild swelling and redness to distal phalanx without fluctuance, drainage. Normal ROM of digit, no pain with flexeril/extension.  [LM]  1311 Scalpel used to remove callous, small amount of purulent material present. No additional drainage present.  Digital block as requested by patient for pain management, advised this would be very limited. Recommend Doxycycline and warm water soaks with ROM TID. Follow up with hand ortho if not improving, return precautions given.  [LM]    Clinical Course User Index [LM] Alden HippMurphy, Lekha Dancer A, PA-C   MDM Rules/Calculators/A&P                          Final Clinical Impression(s) / ED Diagnoses Final diagnoses:  Abscess    Rx / DC Orders ED Discharge Orders         Ordered    doxycycline (VIBRAMYCIN) 100 MG capsule  2  times daily        03/05/21 1156           Alden Hipp 03/05/21 1312    Melene Plan, DO 03/05/21 1446

## 2021-03-06 DIAGNOSIS — R0902 Hypoxemia: Secondary | ICD-10-CM | POA: Diagnosis not present

## 2021-03-06 DIAGNOSIS — J449 Chronic obstructive pulmonary disease, unspecified: Secondary | ICD-10-CM | POA: Diagnosis not present

## 2021-03-15 ENCOUNTER — Encounter (HOSPITAL_COMMUNITY): Payer: Self-pay | Admitting: Emergency Medicine

## 2021-03-15 ENCOUNTER — Emergency Department (HOSPITAL_COMMUNITY): Payer: Medicare Other

## 2021-03-15 ENCOUNTER — Other Ambulatory Visit: Payer: Self-pay

## 2021-03-15 ENCOUNTER — Emergency Department (HOSPITAL_COMMUNITY)
Admission: EM | Admit: 2021-03-15 | Discharge: 2021-03-15 | Disposition: A | Discharge status: Home / Self Care | Payer: Medicare Other | Attending: Emergency Medicine | Admitting: Emergency Medicine

## 2021-03-15 DIAGNOSIS — F17210 Nicotine dependence, cigarettes, uncomplicated: Secondary | ICD-10-CM | POA: Diagnosis not present

## 2021-03-15 DIAGNOSIS — L02512 Cutaneous abscess of left hand: Secondary | ICD-10-CM | POA: Insufficient documentation

## 2021-03-15 DIAGNOSIS — L02511 Cutaneous abscess of right hand: Secondary | ICD-10-CM | POA: Diagnosis not present

## 2021-03-15 DIAGNOSIS — J45909 Unspecified asthma, uncomplicated: Secondary | ICD-10-CM | POA: Insufficient documentation

## 2021-03-15 DIAGNOSIS — I10 Essential (primary) hypertension: Secondary | ICD-10-CM | POA: Insufficient documentation

## 2021-03-15 DIAGNOSIS — L089 Local infection of the skin and subcutaneous tissue, unspecified: Secondary | ICD-10-CM | POA: Diagnosis not present

## 2021-03-15 DIAGNOSIS — Z7951 Long term (current) use of inhaled steroids: Secondary | ICD-10-CM | POA: Diagnosis not present

## 2021-03-15 DIAGNOSIS — L02519 Cutaneous abscess of unspecified hand: Secondary | ICD-10-CM

## 2021-03-15 DIAGNOSIS — M7989 Other specified soft tissue disorders: Secondary | ICD-10-CM | POA: Diagnosis present

## 2021-03-15 MED ORDER — LIDOCAINE HCL (PF) 1 % IJ SOLN
5.0000 mL | Freq: Once | INTRAMUSCULAR | Status: AC
  Administered 2021-03-15: 5 mL
  Filled 2021-03-15: qty 5

## 2021-03-15 MED ORDER — DOXYCYCLINE HYCLATE 100 MG PO CAPS: 100.0000 mg | ORAL_CAPSULE | Freq: Two times a day (BID) | ORAL | 0 refills | Status: DC

## 2021-03-15 MED ORDER — BACITRACIN ZINC 500 UNIT/GM EX OINT: TOPICAL_OINTMENT | Freq: Two times a day (BID) | CUTANEOUS | Status: AC

## 2021-03-15 NOTE — ED Provider Notes (Signed)
[facility]   CSN: [id number] Arrival date & time: 03/15/21  0915     History No chief complaint on file.   Frederick [name] is a 60 y.o. male.  60 year old male presents with ongoing right second finger infection.  Patient reports onset of infection in the finger about a month ago, has been to urgent care in the ER, taken antibiotics however continues to have swelling in the tip of his finger which is painful.  Patient has not followed up with hand surgery as referred at previous visit.        Past Medical History:  Diagnosis Date  . Arthritis   . Asthma   . EtOH dependence (HCC)    drinks 2-3 40s a day  . Fibula fracture    Dr Dion SaucierLandau 02-2011  . Hernia    surgery 01-17-13  . Hypertension   . Tuberculosis     Patient Active Problem List   Diagnosis Date Noted  . Marijuana abuse 07/22/2017  . Hyponatremia 12/03/2015  . Left rib fracture 11/29/2015  . Microcytic anemia 11/29/2015  . Cigarette smoker 11/29/2015  . Protein-calorie malnutrition, severe (HCC) 11/29/2015  . Unintentional weight loss 11/29/2015  . History of tuberculosis 11/28/2015  . Cavitary lesion of lung 11/28/2015  . SBO (small bowel obstruction) (HCC) 03/10/2013  . Abdominal pain, diffuse 03/10/2013  . Nausea and vomiting 03/10/2013  . Fracture of fibula, proximal 01/21/2013  . Closed fracture of left distal distal tibia 01/21/2013  . EtOH dependence (HCC)   . Fibula fracture   . Hernia     Past Surgical History:  Procedure Laterality Date  . ANKLE SURGERY    . INGUINAL HERNIA REPAIR Right 01/19/2013   Procedure: HERNIA REPAIR INGUINAL ADULT;  Surgeon: Atilano InaEric M Wilson, MD,FACS;  Location: [facility] OR;  Service: General;  Laterality: Right;  . TIBIA IM NAIL INSERTION Left 01/22/2013   Procedure: INTRAMEDULLARY (IM) NAIL TIBIAL;  Surgeon: Eulas PostJoshua P Landau, MD;  Location: [facility] OR;  Service: Orthopedics;  Laterality: Left;       Family History  Problem  Relation Age of Onset  . Diabetes Mother   . Diabetes Father   . Hypertension Father     Social History   Tobacco Use  . Smoking status: Current Every Day Smoker    Packs/day: 0.50    Types: Cigarettes  . Smokeless tobacco: Never Used  . Tobacco comment: 1 ppd  Substance Use Topics  . Alcohol use: Yes    Alcohol/week: 14.0 standard drinks    Types: 14 Cans of beer per week    Comment: occasionally; generally drinks (2) 40s/day  . Drug use: No    Home Medications Prior to Admission medications   Medication Sig Start Date End Date Taking? Authorizing Provider  acetaminophen (TYLENOL) 325 MG tablet Take 650 mg by mouth every 6 (six) hours as needed for mild pain or headache.    [provider]  albuterol (VENTOLIN HFA) 108 (90 Base) MCG/ACT inhaler Inhale 2 puffs into the lungs at bedtime. 12/06/19   [provider]  chlordiazePOXIDE (LIBRIUM) 25 MG capsule 50mg  PO TID x 1D, then 25-50mg  PO BID X 1D, then 25-50mg  PO QD X 1D 04/17/20   Renne CriglerGeiple, Joshua, PA-C  diclofenac (VOLTAREN) 75 MG EC tablet Take 75 mg by mouth 2 (two) times daily as needed for mild pain.  04/03/20   [provider]  doxycycline (VIBRAMYCIN) 100 MG capsule Take 1 capsule (100 mg  total) by mouth 2 (two) times daily. 03/15/21   Jeannie Fend, PA-C  fluticasone (FLONASE) 50 MCG/ACT nasal spray Place 1 spray into both nostrils daily. Patient taking differently: Place 1 spray into both nostrils daily as needed for allergies. 07/28/15   Hedges, Tinnie Gens, PA-C  gentamicin cream (GARAMYCIN) 0.1 % Apply between right 4th and 5th toes once daily 06/06/20   Freddie Breech, DPM  methocarbamol (ROBAXIN) 500 MG tablet Take 500 mg by mouth 2 (two) times daily as needed for muscle spasms.  04/03/20   [provider]  Multiple Vitamin (MULTIVITAMIN WITH MINERALS) TABS tablet Take 1 tablet by mouth daily. 05/31/19   Horton, Mayer Masker, MD  Naftifine HCl 2 % GEL Apply one application daily between  affected toes after bathing and drying well, let dry before going into socks/shoes 07/14/20   McDonald, Adam R, DPM  polyvinyl alcohol (LIQUIFILM TEARS) 1.4 % ophthalmic solution Place 1 drop into both eyes as needed for dry eyes.    [provider]  SPIRIVA HANDIHALER 18 MCG inhalation capsule  04/21/20   [provider]  SPIRIVA RESPIMAT 2.5 MCG/ACT AERS Inhale 2 sprays into the lungs every other day.  11/24/15   [provider]  traMADol (ULTRAM) 50 MG tablet Take 1 tablet (50 mg total) by mouth every 6 (six) hours as needed. 05/06/20   Moshe Cipro, NP  triamcinolone cream (KENALOG) 0.1 % Apply 1 application topically 2 (two) times daily. 05/06/20   Moshe Cipro, NP  ferrous sulfate 325 (65 FE) MG tablet Take 1 tablet (325 mg total) by mouth 2 (two) times daily with a meal. Patient not taking: Reported on 02/12/2020 12/03/15 04/17/20  Renae Fickle, MD  ipratropium-albuterol (DUONEB) 0.5-2.5 (3) MG/3ML SOLN Take 3 mLs by nebulization every 6 (six) hours as needed. Patient not taking: Reported on 02/12/2020 12/02/15 04/17/20  Kathlen Mody, MD  zolpidem (AMBIEN) 5 MG tablet Take 1 tablet (5 mg total) by mouth at bedtime as needed for sleep. Patient not taking: Reported on 02/12/2020 12/02/15 04/17/20  Kathlen Mody, MD    Allergies    Patient has no known allergies.  Review of Systems   Review of Systems  Constitutional: Negative for fever.  Musculoskeletal: Positive for myalgias.  Skin: Positive for wound.  Allergic/Immunologic: Negative for immunocompromised state.  Neurological: Negative for weakness and numbness.    Physical Exam Updated Vital Signs BP (!) 138/95 (BP Location: Right Arm)   Pulse 97   Temp 98.2 F (36.8 C) (Oral)   Resp 18   SpO2 100%   Physical Exam Vitals and nursing note reviewed.  Constitutional:      General: He is not in acute distress.    Appearance: He is well-developed. He is not diaphoretic.  HENT:     Head:  Normocephalic and atraumatic.  Cardiovascular:     Pulses: Normal pulses.  Pulmonary:     Effort: Pulmonary effort is normal.  Musculoskeletal:        General: Swelling and tenderness present.     Comments: Eschar to tip of left index finger with mild swelling and tenderness of distal phalanx.  No active drainage.  No pain with range of motion of the finger or with palpation of flexor or extensor tendons.  Skin:    General: Skin is warm and dry.     Findings: No erythema.  Neurological:     Mental Status: He is alert and oriented to person, place, and time.  Sensory: No sensory deficit.  Psychiatric:        Behavior: Behavior normal.     ED Results / Procedures / Treatments   Labs (all labs ordered are listed, but only abnormal results are displayed) Labs Reviewed - No data to display  EKG None  Radiology DG Finger Index Right  Result Date: 03/15/2021 CLINICAL DATA:  Finger infection. EXAM: RIGHT INDEX FINGER 2+V COMPARISON:  None. FINDINGS: No fracture. No subluxation or dislocation. No worrisome lytic or sclerotic osseous abnormality. No evidence for radiopaque soft tissue foreign body. No definite soft tissue gas. IMPRESSION: Negative. Electronically Signed   By: Kennith Center M.D.   On: 03/15/2021 10:17    Procedures .Marland KitchenIncision and Drainage  Date/Time: 03/15/2021 11:00 AM Performed by: Jeannie Fend, PA-C Authorized by: Jeannie Fend, PA-C   Consent:    Consent obtained:  Verbal   Consent given by:  Patient   Risks discussed:  Bleeding, incomplete drainage, pain and damage to other organs   Alternatives discussed:  No treatment Universal protocol:    Procedure explained and questions answered to patient or proxy's satisfaction: yes     Relevant documents present and verified: yes     Test results available : yes     Imaging studies available: yes     Required blood products, implants, devices, and special equipment available: yes     Site/side marked: yes      Immediately prior to procedure, a time out was called: yes     Patient identity confirmed:  Verbally with patient Location:    Type:  Abscess   Size:  1cm x 1cm Pre-procedure details:    Skin preparation:  Betadine Anesthesia:    Anesthesia method:  Nerve block   Block location:  2nd MCP digital block   Block needle gauge:  25 G   Block anesthetic:  Lidocaine 1% w/o epi   Block injection procedure:  Anatomic landmarks identified, anatomic landmarks palpated, incremental injection and introduced needle   Block outcome:  Anesthesia achieved Procedure type:    Complexity:  Simple Procedure details:    Incision types:  Single straight   Incision depth:  Dermal   Drainage:  Purulent   Drainage amount:  Scant   Wound treatment:  Wound left open   Packing materials:  None Post-procedure details:    Procedure completion:  Tolerated well, no immediate complications     Medications Ordered in ED Medications  lidocaine (PF) (XYLOCAINE) 1 % injection 5 mL (has no administration in time range)  bacitracin ointment (has no administration in time range)    ED Course  I have reviewed the triage vital signs and the nursing notes.  Pertinent labs & imaging results that were available during my care of the patient were reviewed by me and considered in my medical decision making (see chart for details).  Clinical Course as of 03/15/21 1101  Sun Mar 15, 2021  4548 60 year old male with ongoing right index finger pain.  Patient was last seen in this emergency room about 10 days ago, given prescription for doxycycline which he completed however did not follow-up with hand specialist and has not been doing warm water soaks. On exam, has eschar to tip of finger with swelling and tenderness of the distal phalanx. X-ray does not show bony involvement or foreign body. Anesthetized with digital block with resolution of pain.  Eschar removed, very trace amount of purulent drainage towards the  anterior aspect of the finger, exploration  of that area does not reveal further purulent material or drainable collection. Patient will be placed on further antibiotics, again recommend warm compresses.  Stressed the importance of follow-up with hand Ortho and smoking cessation.  Informed patient poor compliance could lead to worsening infection or loss of his finger.  Patient verbalizes understanding and agrees to call tomorrow for an appointment. [LM]    Clinical Course User Index [LM] Alden Hipp   MDM Rules/Calculators/A&P                          Final Clinical Impression(s) / ED Diagnoses Final diagnoses:  Abscess of index finger    Rx / DC Orders ED Discharge Orders         Ordered    doxycycline (VIBRAMYCIN) 100 MG capsule  2 times daily        03/15/21 1046           Alden Hipp 03/15/21 1101    Melene Plan, DO 03/15/21 1347

## 2021-03-15 NOTE — Discharge Instructions (Addendum)
Take antibiotics as prescribed and complete the full course. Warm water soaks for 20 minutes at a time at least 3 times daily.  Follow-up with the hand specialist, it is important that you call and schedule this appointment to avoid worsening infection or loss of finger.

## 2021-03-15 NOTE — ED Triage Notes (Signed)
Pt. Stated, Frederick [name] had this finger infection for 2 weeks and have been here twice.  Right index finger.

## 2021-03-24 ENCOUNTER — Ambulatory Visit: Payer: Medicare Other | Admitting: Orthopaedic Surgery

## 2021-04-01 ENCOUNTER — Encounter: Payer: Self-pay | Admitting: Orthopaedic Surgery

## 2021-04-01 ENCOUNTER — Ambulatory Visit (INDEPENDENT_AMBULATORY_CARE_PROVIDER_SITE_OTHER): Payer: Medicare Other | Admitting: Orthopaedic Surgery

## 2021-04-01 DIAGNOSIS — L03012 Cellulitis of left finger: Secondary | ICD-10-CM

## 2021-04-01 MED ORDER — HYDROCODONE-ACETAMINOPHEN 5-325 MG PO TABS: 1.0000 | ORAL_TABLET | Freq: Four times a day (QID) | ORAL | 0 refills | Status: DC | PRN

## 2021-04-01 MED ORDER — DOXYCYCLINE HYCLATE 100 MG PO TABS: 100.0000 mg | ORAL_TABLET | Freq: Two times a day (BID) | ORAL | 0 refills | Status: DC

## 2021-04-01 NOTE — Progress Notes (Signed)
She is referred to [place] from the emergency room after having been there 3 different times for an infection involving his left index finger.  He was actually referred after his second visit to Dr. Betha Loa who had been on call.  I am not sure how he did not make that appointment.  He had been on doxycycline as well.  He reports pain at night and is requesting oxycodone.  He is 60 years old.  This started sometime in early March.  He denies being a diabetic.  I reviewed x-rays from April 10 from his ER visit involving the left index finger.  There is no evidence of osteomyelitis or abnormalities of the bone.  Examination of his left index finger shows some pain just behind the fingernail itself but there is no redness no drainage at all this point.  There is eschar at the end of the finger suggesting he had a wound at the fingertip but there is no drainage from this at all.  It does look like his infection is resolving.  He should continue to his local wound care and at least 1 more round of doxycycline.  I will call in a one-time prescription for Norco but he should use this sparingly because I will not refill this.  I recommended Tylenol and anti-inflammatory such as ibuprofen or Aleve.

## 2021-04-05 DIAGNOSIS — J449 Chronic obstructive pulmonary disease, unspecified: Secondary | ICD-10-CM | POA: Diagnosis not present

## 2021-04-05 DIAGNOSIS — R0902 Hypoxemia: Secondary | ICD-10-CM | POA: Diagnosis not present

## 2021-04-08 DIAGNOSIS — J302 Other seasonal allergic rhinitis: Secondary | ICD-10-CM | POA: Diagnosis not present

## 2021-04-08 DIAGNOSIS — Z0000 Encounter for general adult medical examination without abnormal findings: Secondary | ICD-10-CM | POA: Diagnosis not present

## 2021-04-08 DIAGNOSIS — I10 Essential (primary) hypertension: Secondary | ICD-10-CM | POA: Diagnosis not present

## 2021-04-08 DIAGNOSIS — Z13220 Encounter for screening for lipoid disorders: Secondary | ICD-10-CM | POA: Diagnosis not present

## 2021-04-08 DIAGNOSIS — E559 Vitamin D deficiency, unspecified: Secondary | ICD-10-CM | POA: Diagnosis not present

## 2021-04-08 DIAGNOSIS — Z1211 Encounter for screening for malignant neoplasm of colon: Secondary | ICD-10-CM | POA: Diagnosis not present

## 2021-04-08 DIAGNOSIS — Z131 Encounter for screening for diabetes mellitus: Secondary | ICD-10-CM | POA: Diagnosis not present

## 2021-04-08 DIAGNOSIS — M179 Osteoarthritis of knee, unspecified: Secondary | ICD-10-CM | POA: Diagnosis not present

## 2021-04-08 DIAGNOSIS — S61200A Unspecified open wound of right index finger without damage to nail, initial encounter: Secondary | ICD-10-CM | POA: Diagnosis not present

## 2021-05-06 DIAGNOSIS — R0902 Hypoxemia: Secondary | ICD-10-CM | POA: Diagnosis not present

## 2021-05-06 DIAGNOSIS — J449 Chronic obstructive pulmonary disease, unspecified: Secondary | ICD-10-CM | POA: Diagnosis not present

## 2021-05-08 ENCOUNTER — Encounter (HOSPITAL_COMMUNITY): Payer: Self-pay | Admitting: Pharmacy Technician

## 2021-05-08 ENCOUNTER — Emergency Department (HOSPITAL_COMMUNITY)
Admission: EM | Admit: 2021-05-08 | Discharge: 2021-05-08 | Disposition: A | Discharge status: Home / Self Care | Payer: Medicare Other | Attending: Emergency Medicine | Admitting: Emergency Medicine

## 2021-05-08 ENCOUNTER — Other Ambulatory Visit: Payer: Self-pay

## 2021-05-08 DIAGNOSIS — M79644 Pain in right finger(s): Secondary | ICD-10-CM | POA: Diagnosis not present

## 2021-05-08 DIAGNOSIS — Z5321 Procedure and treatment not carried out due to patient leaving prior to being seen by health care provider: Secondary | ICD-10-CM | POA: Diagnosis not present

## 2021-05-08 DIAGNOSIS — L089 Local infection of the skin and subcutaneous tissue, unspecified: Secondary | ICD-10-CM | POA: Insufficient documentation

## 2021-05-08 NOTE — ED Notes (Signed)
Pt stated he is leaving and he is not sitting here all night

## 2021-05-08 NOTE — ED Triage Notes (Signed)
Pt here with concern for R index finger infection. No redness/drainage noted. Pt states taking abx without relief. Denies fevers.

## 2021-05-08 NOTE — ED Notes (Signed)
Pt did not answer for room. 

## 2021-05-08 NOTE — ED Provider Notes (Signed)
Emergency Medicine Provider Triage Evaluation Note  Frederick [name] , a 60 y.o. male  was evaluated in triage.  Pt complains of right index finger pain and infection.  States that he has been on antibiotics for this before.  Denies any fevers.  Asking for sandwich.  Review of Systems  Positive: Finger infection Negative: Fever  Physical Exam  BP 123/84 (BP Location: Left Arm)   Pulse 90   Temp 98.7 F (37.1 C) (Oral)   Resp 16   SpO2 100%  Gen:   Awake, no distress   Resp:  Normal effort  MSK:   Moves extremities without difficulty  Other:  See below     Medical Decision Making  Medically screening exam initiated at 5:49 PM.  Appropriate orders placed.  [name] was informed that the remainder of the evaluation will be completed by another provider, this initial triage assessment does not replace that evaluation, and the importance of remaining in the ED until their evaluation is complete.     Dietrich Pates, PA-C 05/08/21 1751    Tegeler, Canary Brim, MD 05/08/21 1754

## 2021-05-16 ENCOUNTER — Emergency Department (HOSPITAL_COMMUNITY)
Admission: EM | Admit: 2021-05-16 | Discharge: 2021-05-16 | Discharge status: Against Medical Advice | Payer: Medicare Other | Attending: Emergency Medicine | Admitting: Emergency Medicine

## 2021-05-16 ENCOUNTER — Encounter (HOSPITAL_COMMUNITY): Payer: Self-pay | Admitting: Emergency Medicine

## 2021-05-16 ENCOUNTER — Emergency Department (HOSPITAL_COMMUNITY): Payer: Medicare Other

## 2021-05-16 DIAGNOSIS — M7989 Other specified soft tissue disorders: Secondary | ICD-10-CM | POA: Diagnosis not present

## 2021-05-16 DIAGNOSIS — Z5321 Procedure and treatment not carried out due to patient leaving prior to being seen by health care provider: Secondary | ICD-10-CM | POA: Insufficient documentation

## 2021-05-16 DIAGNOSIS — M79644 Pain in right finger(s): Secondary | ICD-10-CM | POA: Insufficient documentation

## 2021-05-16 LAB — CBC WITH DIFFERENTIAL/PLATELET
Abs Immature Granulocytes: 0.04 10*3/uL (ref 0.00–0.07)
Basophils Absolute: 0 10*3/uL (ref 0.0–0.1)
Basophils Relative: 1 %
Eosinophils Absolute: 0.1 10*3/uL (ref 0.0–0.5)
Eosinophils Relative: 3 %
HCT: 43 % (ref 39.0–52.0)
Hemoglobin: 14.3 g/dL (ref 13.0–17.0)
Immature Granulocytes: 1 %
Lymphocytes Relative: 22 %
Lymphs Abs: 1.1 10*3/uL (ref 0.7–4.0)
MCH: 31.1 pg (ref 26.0–34.0)
MCHC: 33.3 g/dL (ref 30.0–36.0)
MCV: 93.5 fL (ref 80.0–100.0)
Monocytes Absolute: 0.9 10*3/uL (ref 0.1–1.0)
Monocytes Relative: 17 %
Neutro Abs: 3 10*3/uL (ref 1.7–7.7)
Neutrophils Relative %: 56 %
Platelets: 184 10*3/uL (ref 150–400)
RBC: 4.6 MIL/uL (ref 4.22–5.81)
RDW: 14.7 % (ref 11.5–15.5)
WBC: 5.2 10*3/uL (ref 4.0–10.5)
nRBC: 0 % (ref 0.0–0.2)

## 2021-05-16 LAB — BASIC METABOLIC PANEL
Anion gap: 8 (ref 5–15)
BUN: <5 mg/dL — ABNORMAL LOW (ref 6–20)
CO2: 26 mmol/L (ref 22–32)
Calcium: 8.7 mg/dL — ABNORMAL LOW (ref 8.9–10.3)
Chloride: 101 mmol/L (ref 98–111)
Creatinine, Ser: 0.82 mg/dL (ref 0.61–1.24)
GFR, Estimated: >60 mL/min (ref >60)
Glucose, Bld: 82 mg/dL (ref 70–99)
Potassium: 3.9 mmol/L (ref 3.5–5.1)
Sodium: 135 mmol/L (ref 135–145)

## 2021-05-16 LAB — LACTIC ACID, PLASMA: Lactic Acid, Venous: 2.6 mmol/L (ref 0.5–1.9)

## 2021-05-16 NOTE — ED Provider Notes (Signed)
Emergency Medicine Provider Triage Evaluation Note  Frederick [name] , a 60 y.o. male  was evaluated in triage.  Pt complains of right index finger pain and swelling.  Began 3 weeks ago.  Not necessarily worse however not improving.  He is unable to bend his finger.  He has some darkened skin to his distal fingertip.  He denies any prior history of osteomyelitis or diabetes.  Denies any paresthesias or weakness.  Denies any injury.  Review of Systems  Positive: Right index finger pain and swelling Negative: Paresthesias, weakness, fever, chills  Physical Exam  BP 126/83   Pulse 89   Temp 98.2 F (36.8 C) (Oral)   Resp 18   Ht 6\' 1"  (1.854 m)   Wt 81.6 kg   SpO2 100%   BMI 23.75 kg/m  Gen:   Awake, no distress   Resp:  Normal effort  MSK:   Diffuse fusiform swelling to right index finger.  Skin breakdown to distal tip.  Some mild erythema.  No bony tenderness to metacarpals, proximal hand Other:    Medical Decision Making  Medically screening exam initiated at 6:40 PM.  Appropriate orders placed.  was informed that the remainder of the evaluation will be completed by another provider, this initial triage assessment does not replace that evaluation, and the importance of remaining in the ED until their evaluation is complete.  Right finger swelling and warmth.  Appears of underlying infection.  Denies any systemic symptoms.  Labs and imaging ordered.   Adelle Zachar A, PA-C 05/16/21 1842    07/16/21, MD 05/16/21 1940

## 2021-05-16 NOTE — ED Triage Notes (Signed)
Pt reports possible infection to right index finger for 3 weeks. Pain started last night.

## 2021-05-16 NOTE — ED Notes (Signed)
PT is not answering call for room admittance

## 2021-05-16 NOTE — ED Provider Notes (Signed)
Lab called, lactic acid 2.6  Reviewed x-ray patient with osteomyelitis and gas at distal fingertip.  Nursing notified patient needs room in back   Sion Reinders A, PA-C 05/16/21 2015    Benjiman Core, MD 05/16/21 2030

## 2021-05-20 ENCOUNTER — Emergency Department (HOSPITAL_COMMUNITY)
Admission: EM | Admit: 2021-05-20 | Discharge: 2021-05-20 | Disposition: A | Discharge status: Home / Self Care | Payer: Medicare Other | Attending: Emergency Medicine | Admitting: Emergency Medicine

## 2021-05-20 ENCOUNTER — Encounter (HOSPITAL_COMMUNITY): Payer: Self-pay | Admitting: Emergency Medicine

## 2021-05-20 ENCOUNTER — Other Ambulatory Visit: Payer: Self-pay

## 2021-05-20 ENCOUNTER — Emergency Department (HOSPITAL_COMMUNITY): Payer: Medicare Other

## 2021-05-20 DIAGNOSIS — Z7951 Long term (current) use of inhaled steroids: Secondary | ICD-10-CM | POA: Diagnosis not present

## 2021-05-20 DIAGNOSIS — I10 Essential (primary) hypertension: Secondary | ICD-10-CM | POA: Diagnosis not present

## 2021-05-20 DIAGNOSIS — L03011 Cellulitis of right finger: Secondary | ICD-10-CM | POA: Diagnosis not present

## 2021-05-20 DIAGNOSIS — F17210 Nicotine dependence, cigarettes, uncomplicated: Secondary | ICD-10-CM | POA: Diagnosis not present

## 2021-05-20 DIAGNOSIS — M79644 Pain in right finger(s): Secondary | ICD-10-CM | POA: Diagnosis not present

## 2021-05-20 DIAGNOSIS — J45909 Unspecified asthma, uncomplicated: Secondary | ICD-10-CM | POA: Diagnosis not present

## 2021-05-20 LAB — CBC WITH DIFFERENTIAL/PLATELET
Abs Immature Granulocytes: 0.03 10*3/uL (ref 0.00–0.07)
Basophils Absolute: 0 10*3/uL (ref 0.0–0.1)
Basophils Relative: 1 %
Eosinophils Absolute: 0.1 10*3/uL (ref 0.0–0.5)
Eosinophils Relative: 1 %
HCT: 44 % (ref 39.0–52.0)
Hemoglobin: 14.8 g/dL (ref 13.0–17.0)
Immature Granulocytes: 1 %
Lymphocytes Relative: 19 %
Lymphs Abs: 1 10*3/uL (ref 0.7–4.0)
MCH: 31.4 pg (ref 26.0–34.0)
MCHC: 33.6 g/dL (ref 30.0–36.0)
MCV: 93.4 fL (ref 80.0–100.0)
Monocytes Absolute: 0.8 10*3/uL (ref 0.1–1.0)
Monocytes Relative: 16 %
Neutro Abs: 3.2 10*3/uL (ref 1.7–7.7)
Neutrophils Relative %: 62 %
Platelets: 144 10*3/uL — ABNORMAL LOW (ref 150–400)
RBC: 4.71 MIL/uL (ref 4.22–5.81)
RDW: 14.8 % (ref 11.5–15.5)
WBC: 5.1 10*3/uL (ref 4.0–10.5)
nRBC: 0 % (ref 0.0–0.2)

## 2021-05-20 LAB — LACTIC ACID, PLASMA
Lactic Acid, Venous: 3.2 mmol/L (ref 0.5–1.9)
Lactic Acid, Venous: 3.8 mmol/L (ref 0.5–1.9)

## 2021-05-20 LAB — COMPREHENSIVE METABOLIC PANEL
ALT: 60 U/L — ABNORMAL HIGH (ref 0–44)
AST: 95 U/L — ABNORMAL HIGH (ref 15–41)
Albumin: 3.2 g/dL — ABNORMAL LOW (ref 3.5–5.0)
Alkaline Phosphatase: 97 U/L (ref 38–126)
Anion gap: 10 (ref 5–15)
BUN: <5 mg/dL — ABNORMAL LOW (ref 6–20)
CO2: 21 mmol/L — ABNORMAL LOW (ref 22–32)
Calcium: 9.1 mg/dL (ref 8.9–10.3)
Chloride: 101 mmol/L (ref 98–111)
Creatinine, Ser: 0.76 mg/dL (ref 0.61–1.24)
GFR, Estimated: >60 mL/min (ref >60)
Glucose, Bld: 81 mg/dL (ref 70–99)
Potassium: 3.6 mmol/L (ref 3.5–5.1)
Sodium: 132 mmol/L — ABNORMAL LOW (ref 135–145)
Total Bilirubin: 0.8 mg/dL (ref 0.3–1.2)
Total Protein: 8 g/dL (ref 6.5–8.1)

## 2021-05-20 LAB — SEDIMENTATION RATE: Sed Rate: 15 mm/hr (ref 0–16)

## 2021-05-20 LAB — C-REACTIVE PROTEIN: CRP: <0.5 mg/dL (ref <1.0)

## 2021-05-20 MED ORDER — LACTATED RINGERS IV BOLUS
1000.0000 mL | Freq: Once | INTRAVENOUS | Status: AC
  Administered 2021-05-20: 1000 mL via INTRAVENOUS

## 2021-05-20 MED ORDER — DOXYCYCLINE HYCLATE 100 MG PO CAPS: 100.0000 mg | ORAL_CAPSULE | Freq: Two times a day (BID) | ORAL | 0 refills | Status: DC

## 2021-05-20 NOTE — Discharge Instructions (Addendum)
I am concerned the infection in your index finger is getting worse and now starting to affect the bone in your finger.  I want you to follow-up with Dr. Melvyn Novas, he is a Producer, television/film/video. His contact information is below.   I am prescribing you an antibiotic called doxycycline. You have taken this before for your finger. Please take this twice per day for the next 10 days. Please do not stop taking this early.  If you develop any new or worsening symptoms please come back to the emergency department.

## 2021-05-20 NOTE — ED Triage Notes (Signed)
Patient presents with pain in the right index finger. Patient recently went to [facility] due to concerns for infection, but was unable to stay to be seen.

## 2021-05-20 NOTE — ED Notes (Signed)
X-ray at bedside

## 2021-05-20 NOTE — ED Provider Notes (Signed)
[facility] DEPT Provider Note   CSN: [id number] Arrival date & time: 05/20/21  1251     History Chief Complaint  Patient presents with   Hand Pain    Frederick [name] is a 60 y.o. male.  HPI Patient is a 60 year old male with a history of alcohol dependence, hypertension, who presents to the emergency department due to right second digit pain.  Patient states his symptoms started about 2 to 3 months ago.  He has been evaluated in the emergency department multiple times with recurrent infection in the affected finger.  Also evaluated by orthopedics.  He states about 2 days ago his finger began hurting once again.  He states that it is chronically swollen tendinitis and has acutely worsened.  No fevers, chills, nausea, or vomiting.  No other complaints.    Past Medical History:  Diagnosis Date   Arthritis    Asthma    EtOH dependence ([place])    drinks 2-3 40s a day   Fibula fracture    Dr Mardelle Matte 02-2011   Hernia    surgery 01-17-13   Hypertension    Tuberculosis     Patient Active Problem List   Diagnosis Date Noted   Marijuana abuse 07/22/2017   Hyponatremia 12/03/2015   Left rib fracture 11/29/2015   Microcytic anemia 11/29/2015   Cigarette smoker 11/29/2015   Protein-calorie malnutrition, severe ([place]) 11/29/2015   Unintentional weight loss 11/29/2015   History of tuberculosis 11/28/2015   Cavitary lesion of lung 11/28/2015   SBO (small bowel obstruction) ([place]) 03/10/2013   Abdominal pain, diffuse 03/10/2013   Nausea and vomiting 03/10/2013   Fracture of fibula, proximal 01/21/2013   Closed fracture of left distal distal tibia 01/21/2013   EtOH dependence ([place])    Fibula fracture    Hernia     Past Surgical History:  Procedure Laterality Date   ANKLE SURGERY     INGUINAL HERNIA REPAIR Right 01/19/2013   Procedure: HERNIA REPAIR INGUINAL ADULT;  Surgeon: Gayland Curry, MD,FACS;  Location: [facility];  Service: General;  Laterality: Right;    TIBIA IM NAIL INSERTION Left 01/22/2013   Procedure: INTRAMEDULLARY (IM) NAIL TIBIAL;  Surgeon: Johnny Bridge, MD;  Location: [facility];  Service: Orthopedics;  Laterality: Left;       Family History  Problem Relation Age of Onset   Diabetes Mother    Diabetes Father    Hypertension Father     Social History   Tobacco Use   Smoking status: Every Day    Packs/day: 0.50    Pack years: 0.00    Types: Cigarettes   Smokeless tobacco: Never   Tobacco comments:    1 ppd  Substance Use Topics   Alcohol use: Yes    Alcohol/week: 14.0 standard drinks    Types: 14 Cans of beer per week    Comment: occasionally; generally drinks (2) 40s/day   Drug use: No    Home Medications Prior to Admission medications   Medication Sig Start Date End Date Taking? Authorizing Provider  doxycycline (VIBRAMYCIN) 100 MG capsule Take 1 capsule (100 mg total) by mouth 2 (two) times daily. 05/20/21  Yes Rayna Sexton, PA-C  acetaminophen (TYLENOL) 325 MG tablet Take 650 mg by mouth every 6 (six) hours as needed for mild pain or headache.    [provider]  albuterol (VENTOLIN HFA) 108 (90 Base) MCG/ACT inhaler Inhale 2 puffs into the lungs at bedtime. 12/06/19   [provider]  chlordiazePOXIDE (LIBRIUM) 25 MG capsule 25m PO TID x 1D, then 25-518mPO BID X 1D, then 25-5082mO QD X 1D 04/17/20   GeiCarlisle CaterA-C  diclofenac (VOLTAREN) 75 MG EC tablet Take 75 mg by mouth 2 (two) times daily as needed for mild pain.  04/03/20   [provider]  fluticasone (FLONASE) 50 MCG/ACT nasal spray Place 1 spray into both nostrils daily. Patient taking differently: Place 1 spray into both nostrils daily as needed for allergies. 07/28/15   Hedges, JefDellis FilbertA-C  gentamicin cream (GARAMYCIN) 0.1 % Apply between right 4th and 5th toes once daily 06/06/20   GalMarzetta BoardPM  HYDROcodone-acetaminophen (NORCO/VICODIN) 5-325 MG tablet Take 1 tablet by mouth every 6 (six) hours as needed  for moderate pain. 04/01/21   BlaMcarthur RossettiD  methocarbamol (ROBAXIN) 500 MG tablet Take 500 mg by mouth 2 (two) times daily as needed for muscle spasms.  04/03/20   [provider]  Multiple Vitamin (MULTIVITAMIN WITH MINERALS) TABS tablet Take 1 tablet by mouth daily. 05/31/19   Horton, CouBarbette HairD  Naftifine HCl 2 % GEL Apply one application daily between affected toes after bathing and drying well, let dry before going into socks/shoes 07/14/20   McDonald, Adam R, DPM  polyvinyl alcohol (LIQUIFILM TEARS) 1.4 % ophthalmic solution Place 1 drop into both eyes as needed for dry eyes.    [provider]  SPIRIVA HANDIHALER 18 MCG inhalation capsule  04/21/20   [provider]  SPIRIVA RESPIMAT 2.5 MCG/ACT AERS Inhale 2 sprays into the lungs every other day.  11/24/15   [provider]  traMADol (ULTRAM) 50 MG tablet Take 1 tablet (50 mg total) by mouth every 6 (six) hours as needed. 05/06/20   MatFaustino CongressP  triamcinolone cream (KENALOG) 0.1 % Apply 1 application topically 2 (two) times daily. 05/06/20   MatFaustino CongressP  ferrous sulfate 325 (65 FE) MG tablet Take 1 tablet (325 mg total) by mouth 2 (two) times daily with a meal. Patient not taking: Reported on 02/12/2020 12/03/15 04/17/20  ShoJanece CanterburyD  ipratropium-albuterol (DUONEB) 0.5-2.5 (3) MG/3ML SOLN Take 3 mLs by nebulization every 6 (six) hours as needed. Patient not taking: Reported on 02/12/2020 12/02/15 04/17/20  AkuHosie PoissonD  zolpidem (AMBIEN) 5 MG tablet Take 1 tablet (5 mg total) by mouth at bedtime as needed for sleep. Patient not taking: Reported on 02/12/2020 12/02/15 04/17/20  AkuHosie PoissonD    Allergies    Patient has no known allergies.  Review of Systems   Review of Systems  All other systems reviewed and are negative. Ten systems reviewed and are negative for acute change, except as noted in the HPI.   Physical Exam Updated Vital Signs BP (!) 163/107    Pulse 91   Temp 99 F (37.2 C) (Oral)   Resp 17   Ht 6' 1" (1.854 m)   Wt 81.6 kg   SpO2 95%   BMI 23.75 kg/m   Physical Exam Vitals and nursing note reviewed.  Constitutional:      General: He is not in acute distress.    Appearance: Normal appearance. He is not ill-appearing, toxic-appearing or diaphoretic.  HENT:     Head: Normocephalic and atraumatic.     Right Ear: External ear normal.     Left Ear: External ear normal.     Nose: Nose normal.     Mouth/Throat:     Mouth: Mucous membranes are  moist.     Pharynx: Oropharynx is clear. No oropharyngeal exudate or posterior oropharyngeal erythema.  Eyes:     General: No scleral icterus.       Right eye: No discharge.        Left eye: No discharge.     Extraocular Movements: Extraocular movements intact.     Conjunctiva/sclera: Conjunctivae normal.  Cardiovascular:     Rate and Rhythm: Normal rate and regular rhythm.     Pulses: Normal pulses.     Heart sounds: Normal heart sounds. No murmur heard.   No friction rub. No gallop.  Pulmonary:     Effort: Pulmonary effort is normal. No respiratory distress.     Breath sounds: Normal breath sounds. No stridor. No wheezing, rhonchi or rales.  Abdominal:     General: Abdomen is flat.     Tenderness: There is no abdominal tenderness.  Musculoskeletal:        General: Tenderness present. No swelling. Normal range of motion.     Cervical back: Normal range of motion and neck supple. No tenderness.     Comments: Right second digit diffusely edematous.  Moderate tenderness overlying the distal phalanx.  Finger is foul-smelling.  Skin:    General: Skin is warm and dry.  Neurological:     General: No focal deficit present.     Mental Status: He is alert and oriented to person, place, and time.  Psychiatric:        Mood and Affect: Mood normal.        Behavior: Behavior normal.      ED Results / Procedures / Treatments   Labs (all labs ordered are listed, but only abnormal  results are displayed) Labs Reviewed  LACTIC ACID, PLASMA - Abnormal; Notable for the following components:      Result Value   Lactic Acid, Venous 3.2 (*)    All other components within normal limits  COMPREHENSIVE METABOLIC PANEL - Abnormal; Notable for the following components:   Sodium 132 (*)    CO2 21 (*)    BUN <5 (*)    Albumin 3.2 (*)    AST 95 (*)    ALT 60 (*)    All other components within normal limits  CBC WITH DIFFERENTIAL/PLATELET - Abnormal; Notable for the following components:   Platelets 144 (*)    All other components within normal limits  LACTIC ACID, PLASMA  SEDIMENTATION RATE  C-REACTIVE PROTEIN   EKG None  Radiology DG Hand Complete Right  Result Date: 05/20/2021 CLINICAL DATA:  Right index finger pain for 3 weeks. No known injury. Clinical concern for infection. EXAM: RIGHT HAND - COMPLETE 3+ VIEW COMPARISON:  Radiographs 05/16/2021 and 03/15/2021. FINDINGS: As seen on the most recent study, there is irregular bone destruction involving the tip of the distal 2nd phalanx suspicious for osteomyelitis. The overlying soft tissues appear attenuated and may be necrotic. No definite residual soft tissue emphysema or foreign body. The additional bones appear unchanged with mild degenerative changes in the radial aspect of the wrist. IMPRESSION: No significant change in appearance of the distal phalanx of the right index finger over the last 4 days, remaining suspicious for osteomyelitis. Electronically Signed   By: Richardean Sale M.D.   On: 05/20/2021 17:10    Procedures Procedures   Medications Ordered in ED Medications  lactated ringers bolus 1,000 mL (0 mLs Intravenous Stopped 05/20/21 1745)   ED Course  I have reviewed the triage vital signs and the  nursing notes.  Pertinent labs & imaging results that were available during my care of the patient were reviewed by me and considered in my medical decision making (see chart for details).  Clinical Course as  of 05/20/21 1808  Wed May 20, 2021  1637 Lactic Acid, Venous(!!): 3.2 [LJ]    Clinical Course User Index [LJ] Rayna Sexton, PA-C   MDM Rules/Calculators/A&P                          Pt is a 60 y.o. male who presents to the ED d/t a worsening infection in the right index finger with concerning findings for osteomyelitis.   Labs: CBC with platelets of 144. CMP with a sodium of 132, CO2 21, BUN less than 5, albumin of 3.2, AST of 95, ALT of 60. Lactic acid of 3.2 with a  repeat pending.  CRP pending. ESR pending.  Imaging: X-ray of the right hand shows no significant change in appearance of the distal phalanx of the right index finger over the last 4 days.  Remains suspicious for osteomyelitis.  I, Rayna Sexton, PA-C, personally reviewed and evaluated these images and lab results as part of my medical decision-making.  Patient discussed with Dr. Caralyn Guile who recommends patient follow-up with his office outpatient.  Patient afebrile denies any systemic symptoms.  No leukocytosis or neutrophilia.  Mildly elevated lactic acid.  Patient given a liter of IV fluids.  Repeat lactic acid is pending but patient requests to be discharged immediately.  Patient's infection appears chronic appearing.  Feel that discharge is reasonable.  We will send him a new prescription for doxycycline.  He states he is going to follow-up with Dr. Caralyn Guile tomorrow.  We discussed return precautions in length.  His questions were answered and he was amicable at the time of discharge.  Note: Portions of this report may have been transcribed using voice recognition software. Every effort was made to ensure accuracy; however, inadvertent computerized transcription errors may be present.   Final Clinical Impression(s) / ED Diagnoses Final diagnoses:  Cellulitis of right index finger   Rx / DC Orders ED Discharge Orders          Ordered    doxycycline (VIBRAMYCIN) 100 MG capsule  2 times daily         05/20/21 1806             Rayna Sexton, PA-C 05/20/21 1809    Lajean Saver, MD 05/21/21 [phone]

## 2021-05-20 NOTE — ED Notes (Signed)
Pt refused discharge vital signs

## 2021-05-20 NOTE — ED Notes (Signed)
Pt refused to wear pulse ox and BP cuff and requested RN to remove his IV. Pt requesting to speak with PA about his results. RN informed patient his lab results are still processing and the PA will come back to the room when the labs result. Pt persistent about wanting to speak with PA. PA Logan made aware and at bedside.

## 2021-05-25 ENCOUNTER — Ambulatory Visit (INDEPENDENT_AMBULATORY_CARE_PROVIDER_SITE_OTHER): Payer: Medicare Other | Admitting: Physician Assistant

## 2021-05-25 ENCOUNTER — Encounter: Payer: Self-pay | Admitting: Physician Assistant

## 2021-05-25 ENCOUNTER — Other Ambulatory Visit: Payer: Self-pay | Admitting: Physician Assistant

## 2021-05-25 ENCOUNTER — Other Ambulatory Visit: Payer: Self-pay

## 2021-05-25 DIAGNOSIS — M869 Osteomyelitis, unspecified: Secondary | ICD-10-CM | POA: Diagnosis not present

## 2021-05-25 NOTE — Progress Notes (Signed)
Office Visit Note   Patient: Frederick [name]           Date of Birth: [birth year]           MRN: [id number] Visit Date: 05/25/2021              Requested by: [facility] [address] [place],  [place] [address] PCP: [facility]   Assessment & Plan: Visit Diagnoses:  1. Osteomyelitis of finger of right hand (HCC)     Plan: Given the radiographic changes and clinical findings recommend right index finger partial amputation.  We will proceed with this in the near future.  Questions encouraged and answered Dr. Magnus Ivan and myself.  Follow-Up Instructions: Return 2 weeks post op.   Orders:  No orders of the defined types were placed in this encounter.  No orders of the defined types were placed in this encounter.     Procedures: No procedures performed   Clinical Data: No additional findings.   Subjective: Chief Complaint  Patient presents with   Right Index Finger - Pain, Wound Check    HPI Frederick [name] is a 60 year old male comes in today with right index finger pain is been ongoing for at least 2 to 3 months.  No known injury to the right index finger.  He has been to the ER due to the right second finger pain radiographs were obtained of his index finger.  These are reviewed and shows changes involving the distal phalanx consistent with osteomyelitis.  Gas noted in the soft tissue of the right index finger on a film 05/16/2021.  No gas noted 05/20/2021 films. Patient's medical history pertinent for alcohol abuse, hypertension and marijuana abuse.  He denies any fevers or chills.  He is currently on doxycycline which he has been on since 05/20/2021 prescription was given for 10 days.  Review of Systems See HPI otherwise negative or noncontributory.  Objective: Vital Signs: There were no vitals taken for this visit.  Physical Exam General: Well-developed well-nourished male in no acute distress. Psych: Alert and oriented x3 Ortho Exam Right hand index  finger tenderness over the distal phalanx.  No purulence encountered.  Large ulcer over the volar aspect tip of the index finger.  Slight edema throughout the finger.  Dorsal aspect of the index finger the distal portion with some early necrotic changes.  No abnormal warmth.  Specialty Comments:  No specialty comments available.  Imaging: No results found.   PMFS History: Patient Active Problem List   Diagnosis Date Noted   Marijuana abuse 07/22/2017   Hyponatremia 12/03/2015   Left rib fracture 11/29/2015   Microcytic anemia 11/29/2015   Cigarette smoker 11/29/2015   Protein-calorie malnutrition, severe (HCC) 11/29/2015   Unintentional weight loss 11/29/2015   History of tuberculosis 11/28/2015   Cavitary lesion of lung 11/28/2015   SBO (small bowel obstruction) (HCC) 03/10/2013   Abdominal pain, diffuse 03/10/2013   Nausea and vomiting 03/10/2013   Fracture of fibula, proximal 01/21/2013   Closed fracture of left distal distal tibia 01/21/2013   EtOH dependence (HCC)    Fibula fracture    Hernia    Past Medical History:  Diagnosis Date   Arthritis    Asthma    EtOH dependence (HCC)    drinks 2-3 40s a day   Fibula fracture    Dr Dion Saucier 02-2011   Hernia    surgery 01-17-13   Hypertension    Tuberculosis     Family History  Problem Relation Age of Onset   Diabetes Mother    Diabetes Father    Hypertension Father     Past Surgical History:  Procedure Laterality Date   ANKLE SURGERY     INGUINAL HERNIA REPAIR Right 01/19/2013   Procedure: HERNIA REPAIR INGUINAL ADULT;  Surgeon: [name], MD,FACS;  Location: [facility];  Service: General;  Laterality: Right;   TIBIA IM NAIL INSERTION Left 01/22/2013   Procedure: INTRAMEDULLARY (IM) NAIL TIBIAL;  Surgeon: [name], MD;  Location[facility];  Service: Orthopedics;  Laterality: Left;   Social History   Occupational History   Not on file  Tobacco Use   Smoking status: Every Day    Packs/day: 0.50    Pack years:  0.00    Types: Cigarettes   Smokeless tobacco: Never   Tobacco comments:    1 ppd  Substance and Sexual Activity   Alcohol use: Yes    Alcohol/week: 14.0 standard drinks    Types: 14 Cans of beer per week    Comment: occasionally; generally drinks (2) 40s/day   Drug use: No   Sexual activity: Never

## 2021-05-26 ENCOUNTER — Encounter (HOSPITAL_BASED_OUTPATIENT_CLINIC_OR_DEPARTMENT_OTHER): Payer: Self-pay | Admitting: Orthopaedic Surgery

## 2021-05-26 ENCOUNTER — Other Ambulatory Visit: Payer: Self-pay

## 2021-05-26 ENCOUNTER — Telehealth: Payer: Self-pay

## 2021-05-26 DIAGNOSIS — M869 Osteomyelitis, unspecified: Secondary | ICD-10-CM

## 2021-05-26 HISTORY — DX: Osteomyelitis, unspecified: M86.9

## 2021-05-26 NOTE — Telephone Encounter (Signed)
Left message for patient to call me back so I can notify of location change for Friday.

## 2021-05-27 NOTE — Progress Notes (Signed)
Spoke with Frederick [name] billing via secure chat, she called pt and pt wife vm full, pt will be left on surgery schedule since he needs to be done and see if he shows up day of surgery

## 2021-05-27 NOTE — Progress Notes (Signed)
LEFT MESSAGE  AND SENT SECURE CHAT VIA Epic WITH SHERRIE BILLINGS PT NOT RETURNING CALL FOR PRE OP HISTORY AND INSTRUCTIONS.

## 2021-05-27 NOTE — Telephone Encounter (Signed)
Received message from [place] at [facility] that she could not reach patient and voice mail is full.  I advised her that I scheduled him while in the office for [facility] OR.  The OR directors decided to move the surgery to [facility].  I called patient and was able to leave him a voice mail yesterday for him to call me back so I could tell him of the change.  I have not received a return call.  I called again just now and got the voice mail full.  I called the wife's number and call could not be completed.  I told Jasmine December that he needs the surgery so we would leave him on the schedule.  I will keep trying to call him.

## 2021-05-28 ENCOUNTER — Encounter (HOSPITAL_BASED_OUTPATIENT_CLINIC_OR_DEPARTMENT_OTHER): Payer: Self-pay | Admitting: Orthopaedic Surgery

## 2021-05-28 ENCOUNTER — Other Ambulatory Visit: Payer: Self-pay

## 2021-05-28 DIAGNOSIS — R0600 Dyspnea, unspecified: Secondary | ICD-10-CM

## 2021-05-28 DIAGNOSIS — M869 Osteomyelitis, unspecified: Secondary | ICD-10-CM

## 2021-05-28 DIAGNOSIS — R600 Localized edema: Secondary | ICD-10-CM

## 2021-05-28 DIAGNOSIS — R059 Cough, unspecified: Secondary | ICD-10-CM

## 2021-05-28 HISTORY — DX: Cough, unspecified: R05.9

## 2021-05-28 HISTORY — DX: Localized edema: R60.0

## 2021-05-28 HISTORY — DX: Dyspnea, unspecified: R06.00

## 2021-05-28 NOTE — Progress Notes (Addendum)
Spoke w/ via phone for pre-op interview---pt and girlfriend Frederick [name] cell [phone] Lab needs dos----  I stat ekg             Lab results------see below COVID test -----patient states asymptomatic no test needed Arrive at -------1145 am 05-29-2021 NPO after MN NO Solid Food.  Clear liquids from MN until---1045 am then npo Med rec completed Medications to take morning of surgery -----bring use inhalers use prn Diabetic medication -----n/a Patient instructed no nail polish to be worn day of surgery Patient instructed to bring photo id and insurance card day of surgery Patient aware to have Driver (ride ) / caregiver   girlfriend Frederick sercey cell [phone] will drop pt off  for 24 hours after surgery  Patient Special Instructions -----pt told no smoking and no more beer until after surgery Pre-Op special Istructions -----none Patient verbalized understanding of instructions that were given at this phone interview. Patient denies shortness of breath, chest pain, fever, cough at this phone interview.   Spoke with dr Casimiro Needle foster mda and reviewed pt history, pt ok for [facility] and order received for cmp and ekg pre op tomorrow and order placed in epic

## 2021-05-28 NOTE — H&P (Signed)
Frederick [name] is an 60 y.o. male.   Chief Complaint: Infection and pain of the right index fingertip HPI:   The patient is a 60 year old gentleman with osteomyelitis involving the right index finger fingertip.  He has been dealing with a chronic infection for a while and was referred to [place] from the emergency room.  X-rays of the right index fingertip are consistent with osteomyelitis of the distal phalanx.  Given the failure of antibiotics and conservative treatment and the known natural history of osteomyelitis of the bone, a fingertip amputation has been recommended and is warranted for treatment of this type of infection.  Past Medical History:  Diagnosis Date   Arthritis    Asthma    Bilateral lower extremity edema 05/28/2021   propping feet up   Cough 05/28/2021   with clear sputum   Dyspnea    Dyspnea 05/28/2021   with exertion   EtOH dependence (HCC)    drinks 2-3 of 40ounes a day   Fibula fracture    Dr Dion Saucier 02-2011   Finger osteomyelitis, right (HCC) 05/26/2021   right index finger   Hernia    surgery 01-17-13   Hypertension    not taking bp meds since may 2022   Tuberculosis    couple of yrs ago took tx at [facility] departmen per pt on 05-28-2021    Past Surgical History:  Procedure Laterality Date   ANKLE SURGERY     INGUINAL HERNIA REPAIR Right 01/19/2013   Procedure: HERNIA REPAIR INGUINAL ADULT;  Surgeon: [name], MD,FACS;  Location: [facility];  Service: General;  Laterality: Right;   TIBIA IM NAIL INSERTION Left 01/22/2013   Procedure: INTRAMEDULLARY (IM) NAIL TIBIAL;  Surgeon: [name], MD;  Location: [facility];  Service: Orthopedics;  Laterality: Left;    Family History  Problem Relation Age of Onset   Diabetes Mother    Diabetes Father    Hypertension Father    Social History:  reports that he has been smoking cigarettes. He has been smoking an average of 0.50 packs per day. He has never used smokeless tobacco. He reports current alcohol  use. He reports that he does not use drugs.  Allergies: No Known Allergies  No medications prior to admission.    No results found for this or any previous visit (from the past 48 hour(s)). No results found.  Review of Systems  Height 6\' 1"  (1.854 m), weight 83.9 kg. Physical Exam Vitals reviewed.  HENT:     Head: Normocephalic and atraumatic.  Eyes:     Pupils: Pupils are equal, round, and reactive to light.  Cardiovascular:     Rate and Rhythm: Normal rate.  Pulmonary:     Effort: Pulmonary effort is normal.  Abdominal:     Palpations: Abdomen is soft.  Musculoskeletal:     Right hand: Swelling present.       Hands:     Cervical back: Normal range of motion.  Neurological:     Mental Status: He is alert and oriented to person, place, and time.  Psychiatric:        Behavior: Behavior normal.     Assessment/Plan Right index fingertip osteomyelitis  The plan is to proceed to surgery today as an outpatient for right index fingertip amputation due to chronic osteomyelitis and the inability of this to be able to heal.  The patient understands this fully and the risk and benefits of surgery have been described in detail.  Kathryne Hitch, MD 05/28/2021, 6:45 PM

## 2021-05-28 NOTE — Telephone Encounter (Signed)
Spoke with patient today and conferenced Frederick [name] [phone] at [facility] in so she could go over instructions and questions with patient.

## 2021-05-29 ENCOUNTER — Ambulatory Visit (HOSPITAL_BASED_OUTPATIENT_CLINIC_OR_DEPARTMENT_OTHER): Payer: Medicare Other | Admitting: Physician Assistant

## 2021-05-29 ENCOUNTER — Ambulatory Visit (HOSPITAL_BASED_OUTPATIENT_CLINIC_OR_DEPARTMENT_OTHER)
Admission: RE | Admit: 2021-05-29 | Discharge: 2021-05-29 | Disposition: A | Discharge status: Home / Self Care | Payer: Medicare Other | Attending: Orthopaedic Surgery | Admitting: Orthopaedic Surgery

## 2021-05-29 ENCOUNTER — Encounter (HOSPITAL_BASED_OUTPATIENT_CLINIC_OR_DEPARTMENT_OTHER)
Admission: RE | Disposition: A | Discharge status: Home / Self Care | Payer: Self-pay | Source: Home / Self Care | Attending: Orthopaedic Surgery

## 2021-05-29 ENCOUNTER — Encounter (HOSPITAL_BASED_OUTPATIENT_CLINIC_OR_DEPARTMENT_OTHER): Payer: Self-pay | Admitting: Orthopaedic Surgery

## 2021-05-29 DIAGNOSIS — E871 Hypo-osmolality and hyponatremia: Secondary | ICD-10-CM | POA: Diagnosis not present

## 2021-05-29 DIAGNOSIS — I10 Essential (primary) hypertension: Secondary | ICD-10-CM | POA: Diagnosis not present

## 2021-05-29 DIAGNOSIS — M869 Osteomyelitis, unspecified: Secondary | ICD-10-CM | POA: Diagnosis not present

## 2021-05-29 DIAGNOSIS — F17210 Nicotine dependence, cigarettes, uncomplicated: Secondary | ICD-10-CM | POA: Diagnosis not present

## 2021-05-29 DIAGNOSIS — M86641 Other chronic osteomyelitis, right hand: Secondary | ICD-10-CM | POA: Insufficient documentation

## 2021-05-29 DIAGNOSIS — E43 Unspecified severe protein-calorie malnutrition: Secondary | ICD-10-CM | POA: Diagnosis not present

## 2021-05-29 HISTORY — PX: AMPUTATION: SHX166

## 2021-05-29 LAB — POCT I-STAT, CHEM 8
BUN: 6 mg/dL (ref 6–20)
Calcium, Ion: 1.26 mmol/L (ref 1.15–1.40)
Chloride: 98 mmol/L (ref 98–111)
Creatinine, Ser: 0.7 mg/dL (ref 0.61–1.24)
Glucose, Bld: 91 mg/dL (ref 70–99)
HCT: 48 % (ref 39.0–52.0)
Hemoglobin: 16.3 g/dL (ref 13.0–17.0)
Potassium: 3.7 mmol/L (ref 3.5–5.1)
Sodium: 137 mmol/L (ref 135–145)
TCO2: 26 mmol/L (ref 22–32)

## 2021-05-29 SURGERY — AMPUTATION DIGIT
Anesthesia: Monitor Anesthesia Care | Site: Hand | Laterality: Right

## 2021-05-29 MED ORDER — ACETAMINOPHEN 160 MG/5ML PO SOLN: 325.0000 mg | ORAL | Status: DC | PRN

## 2021-05-29 MED ORDER — PROPOFOL 10 MG/ML IV BOLUS
INTRAVENOUS | Status: DC | PRN
  Administered 2021-05-29: 40 mg via INTRAVENOUS

## 2021-05-29 MED ORDER — CEFAZOLIN SODIUM-DEXTROSE 2-4 GM/100ML-% IV SOLN
INTRAVENOUS | Status: AC
  Filled 2021-05-29: qty 100

## 2021-05-29 MED ORDER — ONDANSETRON HCL 4 MG/2ML IJ SOLN: 4.0000 mg | Freq: Once | INTRAMUSCULAR | Status: DC | PRN

## 2021-05-29 MED ORDER — FENTANYL CITRATE (PF) 100 MCG/2ML IJ SOLN
INTRAMUSCULAR | Status: AC
  Filled 2021-05-29: qty 2

## 2021-05-29 MED ORDER — ONDANSETRON HCL 4 MG/2ML IJ SOLN
INTRAMUSCULAR | Status: DC | PRN
  Administered 2021-05-29: 4 mg via INTRAVENOUS

## 2021-05-29 MED ORDER — ACETAMINOPHEN 325 MG PO TABS
325.0000 mg | ORAL_TABLET | ORAL | Status: DC | PRN
Start: 2021-05-29 — End: 2021-05-29

## 2021-05-29 MED ORDER — MIDAZOLAM HCL 2 MG/2ML IJ SOLN
INTRAMUSCULAR | Status: AC
  Filled 2021-05-29: qty 2

## 2021-05-29 MED ORDER — PROPOFOL 500 MG/50ML IV EMUL
INTRAVENOUS | Status: DC | PRN
  Administered 2021-05-29: 150 ug/kg/min via INTRAVENOUS

## 2021-05-29 MED ORDER — BUPIVACAINE HCL (PF) 0.25 % IJ SOLN
INTRAMUSCULAR | Status: DC | PRN
  Administered 2021-05-29: 13 mL

## 2021-05-29 MED ORDER — MIDAZOLAM HCL 5 MG/5ML IJ SOLN
INTRAMUSCULAR | Status: DC | PRN
  Administered 2021-05-29: 2 mg via INTRAVENOUS

## 2021-05-29 MED ORDER — LIDOCAINE HCL (PF) 2 % IJ SOLN
INTRAMUSCULAR | Status: AC
  Filled 2021-05-29: qty 5

## 2021-05-29 MED ORDER — ONDANSETRON HCL 4 MG/2ML IJ SOLN
INTRAMUSCULAR | Status: AC
  Filled 2021-05-29: qty 2

## 2021-05-29 MED ORDER — FENTANYL CITRATE (PF) 100 MCG/2ML IJ SOLN
INTRAMUSCULAR | Status: DC | PRN
  Administered 2021-05-29: 25 ug via INTRAVENOUS

## 2021-05-29 MED ORDER — OXYCODONE HCL 5 MG PO TABS: 5.0000 mg | ORAL_TABLET | Freq: Once | ORAL | Status: DC | PRN

## 2021-05-29 MED ORDER — PROPOFOL 10 MG/ML IV BOLUS
INTRAVENOUS | Status: AC
  Filled 2021-05-29: qty 20

## 2021-05-29 MED ORDER — FENTANYL CITRATE (PF) 100 MCG/2ML IJ SOLN: 25.0000 ug | INTRAMUSCULAR | Status: DC | PRN

## 2021-05-29 MED ORDER — HYDROCODONE-ACETAMINOPHEN 5-325 MG PO TABS: 1.0000 | ORAL_TABLET | Freq: Four times a day (QID) | ORAL | 0 refills | Status: DC | PRN

## 2021-05-29 MED ORDER — PHENYLEPHRINE 40 MCG/ML (10ML) SYRINGE FOR IV PUSH (FOR BLOOD PRESSURE SUPPORT)
PREFILLED_SYRINGE | INTRAVENOUS | Status: AC
  Filled 2021-05-29: qty 10

## 2021-05-29 MED ORDER — CEFAZOLIN SODIUM-DEXTROSE 2-4 GM/100ML-% IV SOLN
2.0000 g | INTRAVENOUS | Status: AC
  Administered 2021-05-29: 2 g via INTRAVENOUS

## 2021-05-29 MED ORDER — LIDOCAINE HCL (CARDIAC) PF 100 MG/5ML IV SOSY
PREFILLED_SYRINGE | INTRAVENOUS | Status: DC | PRN
  Administered 2021-05-29 (×2): 40 mg via INTRAVENOUS

## 2021-05-29 MED ORDER — PROPOFOL 500 MG/50ML IV EMUL
INTRAVENOUS | Status: AC
  Filled 2021-05-29: qty 50

## 2021-05-29 MED ORDER — LACTATED RINGERS IV SOLN: INTRAVENOUS | Status: DC

## 2021-05-29 MED ORDER — MEPERIDINE HCL 25 MG/ML IJ SOLN: 6.2500 mg | INTRAMUSCULAR | Status: DC | PRN

## 2021-05-29 MED ORDER — OXYCODONE HCL 5 MG/5ML PO SOLN: 5.0000 mg | Freq: Once | ORAL | Status: DC | PRN

## 2021-05-29 SURGICAL SUPPLY — 41 items
APL PRP STRL LF DISP 70% ISPRP (MISCELLANEOUS) ×1
BLADE SURG 15 STRL LF DISP TIS (BLADE) ×2 IMPLANT
BLADE SURG 15 STRL SS (BLADE) ×6
BNDG CMPR 9X4 STRL LF SNTH (GAUZE/BANDAGES/DRESSINGS)
BNDG COHESIVE 1X5 TAN NS LF (GAUZE/BANDAGES/DRESSINGS) ×2 IMPLANT
BNDG COHESIVE 1X5 TAN STRL LF (GAUZE/BANDAGES/DRESSINGS) ×3 IMPLANT
BNDG CONFORM 2 STRL LF (GAUZE/BANDAGES/DRESSINGS) ×3 IMPLANT
BNDG ELASTIC 3X5.8 VLCR STR LF (GAUZE/BANDAGES/DRESSINGS) IMPLANT
BNDG ESMARK 4X9 LF (GAUZE/BANDAGES/DRESSINGS) IMPLANT
CHLORAPREP W/TINT 26 (MISCELLANEOUS) ×2 IMPLANT
CORD BIPOLAR FORCEPS 12FT (ELECTRODE) ×3 IMPLANT
COVER BACK TABLE 60X90IN (DRAPES) ×3 IMPLANT
COVER MAYO STAND STRL (DRAPES) ×3 IMPLANT
COVER WAND RF STERILE (DRAPES) IMPLANT
CUFF TOURN SGL QUICK 18X4 (TOURNIQUET CUFF) IMPLANT
CUFF TOURN SGL QUICK 24 (TOURNIQUET CUFF)
CUFF TRNQT CYL 24X4X16.5-23 (TOURNIQUET CUFF) IMPLANT
DRAPE EXTREMITY T 121X128X90 (DISPOSABLE) ×3 IMPLANT
DRAPE SURG 17X23 STRL (DRAPES) ×3 IMPLANT
DURAPREP 26ML APPLICATOR (WOUND CARE) IMPLANT
GAUZE SPONGE 4X4 12PLY STRL (GAUZE/BANDAGES/DRESSINGS) ×3 IMPLANT
GAUZE XEROFORM 1X8 LF (GAUZE/BANDAGES/DRESSINGS) ×3 IMPLANT
GLOVE SRG 8 PF TXTR STRL LF DI (GLOVE) ×2 IMPLANT
GLOVE SURG ENC MOIS LTX SZ7.5 (GLOVE) ×6 IMPLANT
GLOVE SURG UNDER POLY LF SZ8 (GLOVE) ×6
GOWN STRL REUS W/ TWL LRG LVL3 (GOWN DISPOSABLE) ×1 IMPLANT
GOWN STRL REUS W/TWL LRG LVL3 (GOWN DISPOSABLE) ×3
GOWN STRL REUS W/TWL XL LVL3 (GOWN DISPOSABLE) ×6 IMPLANT
NDL HYPO 25X1 1.5 SAFETY (NEEDLE) IMPLANT
NEEDLE HYPO 25X1 1.5 SAFETY (NEEDLE) IMPLANT
NS IRRIG 1000ML POUR BTL (IV SOLUTION) ×3 IMPLANT
PACK BASIN DAY SURGERY FS (CUSTOM PROCEDURE TRAY) ×3 IMPLANT
PAD CAST 3X4 CTTN HI CHSV (CAST SUPPLIES) ×1 IMPLANT
PADDING CAST COTTON 3X4 STRL (CAST SUPPLIES) ×3
SLEEVE SCD COMPRESS KNEE MED (STOCKING) IMPLANT
STOCKINETTE 4X48 STRL (DRAPES) ×3 IMPLANT
SUT ETHILON 3 0 PS 1 (SUTURE) ×3 IMPLANT
SYR BULB EAR ULCER 3OZ GRN STR (SYRINGE) ×3 IMPLANT
SYR CONTROL 10ML LL (SYRINGE) IMPLANT
TOWEL OR 17X26 10 PK STRL BLUE (TOWEL DISPOSABLE) ×3 IMPLANT
UNDERPAD 30X36 HEAVY ABSORB (UNDERPADS AND DIAPERS) ×3 IMPLANT

## 2021-05-29 NOTE — Brief Op Note (Signed)
05/29/2021  1:32 PM  PATIENT:  Frederick [name]  60 y.o. male  PRE-OPERATIVE DIAGNOSIS:  right 2nd finger osteomyelitis  POST-OPERATIVE DIAGNOSIS:  right 2nd finger osteomyelitis  PROCEDURE:  Procedure(s): PARTIAL RIGHT 2ND FINGER AMPUTATION (Right)  SURGEON:  Surgeon(s) and Role:    Kathryne Hitch, MD - Primary  PHYSICIAN ASSISTANT:  Rexene Edison, PA-C  ANESTHESIA:   local and IV sedation  COUNTS:  YES  DICTATION: .Other Dictation: Dictation Number [id number]  PLAN OF CARE: Discharge to home after PACU  PATIENT DISPOSITION:  PACU - hemodynamically stable.   Delay start of Pharmacological VTE agent (>24hrs) due to surgical blood loss or risk of bleeding: not applicable

## 2021-05-29 NOTE — Anesthesia Preprocedure Evaluation (Signed)
Anesthesia Evaluation  Patient identified by MRN, date of birth, ID band Patient awake    Reviewed: Allergy & Precautions, NPO status , Patient's Chart, lab work & pertinent test results  Airway Mallampati: II  TM Distance: >3 FB Neck ROM: Full    Dental no notable dental hx. (+) Poor Dentition, Chipped, Missing, Dental Advisory Given   Pulmonary asthma , Current Smoker,    Pulmonary exam normal breath sounds clear to auscultation       Cardiovascular hypertension, negative cardio ROS Normal cardiovascular exam Rhythm:Regular Rate:Normal     Neuro/Psych negative neurological ROS  negative psych ROS   GI/Hepatic negative GI ROS, (+)     substance abuse  alcohol use,   Endo/Other  negative endocrine ROS  Renal/GU negative Renal ROS  negative genitourinary   Musculoskeletal negative musculoskeletal ROS (+)   Abdominal   Peds negative pediatric ROS (+)  Hematology negative hematology ROS (+)   Anesthesia Other Findings   Reproductive/Obstetrics negative OB ROS                             Anesthesia Physical Anesthesia Plan  ASA: 3  Anesthesia Plan: MAC   Post-op Pain Management:    Induction: Intravenous  PONV Risk Score and Plan: 2  Airway Management Planned: Mask, Natural Airway, Nasal Cannula and Simple Face Mask  Additional Equipment: None  Intra-op Plan:   Post-operative Plan:   Informed Consent:   Plan Discussed with: Anesthesiologist and CRNA  Anesthesia Plan Comments:         Anesthesia Quick Evaluation

## 2021-05-29 NOTE — Transfer of Care (Signed)
Immediate Anesthesia Transfer of Care Note  Patient: [name]  Procedure(s) Performed: Procedure(s) (LRB): PARTIAL RIGHT 2ND FINGER AMPUTATION (Right)  Patient Location: PACU  Anesthesia Type: General  Level of Consciousness: awake, alert  and oriented  Airway & Oxygen Therapy: Patient Spontanous Breathing and Patient connected to face mask oxygen  Post-op Assessment: Report given to PACU RN and Post -op Vital signs reviewed and stable  Post vital signs: Reviewed and stable  Complications: No apparent anesthesia complications  Last Vitals:  Vitals Value Taken Time  BP 134/88 05/29/21 1337  Temp 36.9 C 05/29/21 1335  Pulse 80 05/29/21 1335  Resp 22 05/29/21 1339  SpO2 100 % 05/29/21 1335  Vitals shown include unvalidated device data.  Last Pain:  Vitals:   05/29/21 1335  TempSrc:   PainSc: 0-No pain      Patients Stated Pain Goal: 3 (05/29/21 1152)  Complications: No notable events documented.

## 2021-05-29 NOTE — Interval H&P Note (Signed)
History and Physical Interval Note: The patient understands that he is here today for an amputation of his right index finger fingertip due to chronic osteomyelitis which is infection in the bone of the distal phalanx.  He understands fully while we have recommended this surgery.  There has been no acute or interval change in his medical status.  See recent H&P.  The risks and benefits of surgery have been described in detail and informed consent is obtained.  The right index finger has been marked.  05/29/2021 12:14 PM  [name]  has presented today for surgery, with the diagnosis of right 2nd finger osteomyelitis.  The various methods of treatment have been discussed with the patient and family. After consideration of risks, benefits and other options for treatment, the patient has consented to  Procedure(s): PARTIAL RIGHT 2ND FINGER AMPUTATION (Right) as a surgical intervention.  The patient's history has been reviewed, patient examined, no change in status, stable for surgery.  I have reviewed the patient's chart and labs.  Questions were answered to the patient's satisfaction.     Kathryne Hitch

## 2021-05-29 NOTE — Op Note (Signed)
[name], Frederick [name] Frederick [name]: [id number] ACCOUNT [name]: [id number] DATE OF BIRTH: 25-Dec-1960 FACILITY: [facility] LOCATION: [facility] PHYSICIAN: Vanita Panda. Magnus Ivan, MD  Operative Report   DATE OF PROCEDURE: 05/29/2021  PREOPERATIVE DIAGNOSIS:  Chronic osteomyelitis, right index fingertip proximal phalanx.  POSTOPERATIVE DIAGNOSIS:  Chronic osteomyelitis, right index fingertip proximal phalanx.  PROCEDURE:  Right index finger fingertip amputation through the DIP joint into the middle phalanx.  SURGEON:  Vanita Panda. Magnus Ivan, MD  ASSISTANT:  Rexene Edison, PA-C  ANESTHESIA: 1.  Mask ventilation, IV sedation. 2.  Local digital block of the right index finger with 0.25% plain Marcaine.  ANTIBIOTICS:  2 g IV Ancef.  BLOOD LOSS:  Minimal.  COMPLICATIONS:  None.  INDICATIONS:  The patient is a 60 year old gentleman who has been dealing with chronic infection of his right index finger.  He has been seen in the emergency room several times and has been on antibiotics.  He continues to have drainage from the  fingertip, and x-ray showed weathering of the bone and a moth-eaten appearance consistent with osteomyelitis.  At this point, the treatment for this type of infection is an amputation of the fingertip.  I described this to him in detail, explained why  this is necessary, and he does wish to proceed given the longevity of this infection of his finger and the fact it has been hurting and continued to drain purulent material.  DESCRIPTION OF PROCEDURE:  After informed consent was obtained, appropriate right index finger was marked.  He was brought to the operating room and placed supine on the operating table with the right arm on an arm table.  His right hand, wrist, and  index finger were prepped and draped with DuraPrep and sterile drapes.  A timeout was called.  He was identified as correct patient, correct right index finger.  Mask ventilation, IV sedation was obtained, and  then we used 0.25% plain Marcaine as a  digital block of the right index finger.  We then used a Penrose drain proximally around the index finger as a local tourniquet.  We then made an incision over the dorsum of the index finger at the DIP joint and carried this in a fishmouth type format to  remove the fingertip.  We then removed the cartilage of the distal aspect of the middle phalanx and backed this down enough so we could have a nice flap of tissue from volar to dorsal to cover the end of the bone.  We did remove the Penrose drain and  hemostasis obtained with electrocautery of the neurovascular bundles.  We then irrigated the soft tissue with normal saline solution and reapproximated the skin flap from volar to dorsal with interrupted 3-0 nylon suture.  Xeroform well-padded sterile  dressing was applied.  He was taken to recovery room in stable condition with all final counts being correct.  [name].   ROH D: 05/29/2021 1:31:40 pm T: 05/29/2021 10:28:00 pm  JOB: [id number]/ [id number]

## 2021-05-29 NOTE — Anesthesia Postprocedure Evaluation (Signed)
Anesthesia Post Note  Patient: Frederick [name]  Procedure(s) Performed: PARTIAL RIGHT 2ND FINGER AMPUTATION (Right: Hand)     Patient location during evaluation: PACU Anesthesia Type: MAC Level of consciousness: awake and alert Pain management: pain level controlled Vital Signs Assessment: post-procedure vital signs reviewed and stable Respiratory status: spontaneous breathing, nonlabored ventilation, respiratory function stable and patient connected to nasal cannula oxygen Cardiovascular status: stable and blood pressure returned to baseline Postop Assessment: no apparent nausea or vomiting Anesthetic complications: no   No notable events documented.  Last Vitals:  Vitals:   05/29/21 1335 05/29/21 1345  BP: 134/88 133/84  Pulse: 80   Resp: 20 (!) 27  Temp: 36.9 C   SpO2: 100% 100%    Last Pain:  Vitals:   05/29/21 1335  TempSrc:   PainSc: 0-No pain                 Leyna Vanderkolk

## 2021-05-29 NOTE — Anesthesia Procedure Notes (Signed)
Procedure Name: MAC Date/Time: 05/29/2021 12:55 PM Performed by: Mechele Claude, CRNA Oxygen Delivery Method: Simple face mask Placement Confirmation: positive ETCO2 and breath sounds checked- equal and bilateral

## 2021-05-29 NOTE — Discharge Instructions (Addendum)
Keep your dressing clean and dry. Do not remove your dressing for at least one week.  Call your surgeon if you experience:   1.  Fever over 101.0. 2.  Inability to urinate. 3.  Nausea and/or vomiting. 4.  Extreme swelling or bruising at the surgical site. 5.  Continued bleeding from the incision. 6.  Increased pain, redness or drainage from the incision. 7.  Problems related to your pain medication. 8.  Any problems and/or concerns  Post Anesthesia Home Care Instructions  Activity: Get plenty of rest for the remainder of the day. A responsible individual must stay with you for 24 hours following the procedure.  For the next 24 hours, DO NOT: -Drive a car -Advertising copywriter -Drink alcoholic beverages -Take any medication unless instructed by your physician -Make any legal decisions or sign important papers.  Meals: Start with liquid foods such as gelatin or soup. Progress to regular foods as tolerated. Avoid greasy, spicy, heavy foods. If nausea and/or vomiting occur, drink only clear liquids until the nausea and/or vomiting subsides. Call your physician if vomiting continues.  Special Instructions/Symptoms: Your throat may feel dry or sore from the anesthesia or the breathing tube placed in your throat during surgery. If this causes discomfort, gargle with warm salt water. The discomfort should disappear within 24 hours.  If you had a scopolamine patch placed behind your ear for the management of post- operative nausea and/or vomiting:  1. The medication in the patch is effective for 72 hours, after which it should be removed.  Wrap patch in a tissue and discard in the trash. Wash hands thoroughly with soap and water. 2. You may remove the patch earlier than 72 hours if you experience unpleasant side effects which may include dry mouth, dizziness or visual disturbances. 3. Avoid touching the patch. Wash your hands with soap and water after contact with the patch.

## 2021-06-01 ENCOUNTER — Encounter (HOSPITAL_BASED_OUTPATIENT_CLINIC_OR_DEPARTMENT_OTHER): Payer: Self-pay | Admitting: Orthopaedic Surgery

## 2021-06-05 DIAGNOSIS — R0902 Hypoxemia: Secondary | ICD-10-CM | POA: Diagnosis not present

## 2021-06-05 DIAGNOSIS — J449 Chronic obstructive pulmonary disease, unspecified: Secondary | ICD-10-CM | POA: Diagnosis not present

## 2021-06-09 ENCOUNTER — Encounter: Payer: Medicare Other | Admitting: Orthopaedic Surgery

## 2021-06-15 ENCOUNTER — Encounter: Payer: Self-pay | Admitting: Physician Assistant

## 2021-06-15 ENCOUNTER — Other Ambulatory Visit: Payer: Self-pay

## 2021-06-15 ENCOUNTER — Ambulatory Visit (INDEPENDENT_AMBULATORY_CARE_PROVIDER_SITE_OTHER): Payer: Medicare Other | Admitting: Physician Assistant

## 2021-06-15 DIAGNOSIS — Z89021 Acquired absence of right finger(s): Secondary | ICD-10-CM

## 2021-06-15 NOTE — Progress Notes (Signed)
HPI: Frederick [name] returns today for follow-up status post right second finger partial amputation 05/29/2021.  He is overall doing well.  No fevers chills.  Right hand: Second finger surgical incisions are approximated interrupted nylon sutures.  No signs of gross infection.  No wound dehiscence.  Slight serosanguineous drainage.  Unable to express any purulence.  Impression: Status post right second finger partial amputation 05/29/2021  Plan: I have him back in a week for removal of sutures.  In the interim he will wash the hand daily with an antibacterial soap and dry completely.  He is given mupirocin which she will apply it then amount over the incision site and then wrapped with 4 x 4' gauze and Coban as shown today.  Questions were encouraged and answered

## 2021-06-22 ENCOUNTER — Telehealth: Payer: Self-pay

## 2021-06-22 NOTE — Telephone Encounter (Signed)
Lvm for wife to cb to get pt scheduled for this week

## 2021-06-22 NOTE — Telephone Encounter (Signed)
Patient's wife called. She wants patient scheduled for today for suture removal s/p right second finger amputation 05/29/2021. I explained that his schedule and [facility] was already full, but would have his assistance call to schedule if able to overbook. [phone]. Thanks!

## 2021-06-23 ENCOUNTER — Other Ambulatory Visit: Payer: Self-pay

## 2021-06-23 ENCOUNTER — Ambulatory Visit (INDEPENDENT_AMBULATORY_CARE_PROVIDER_SITE_OTHER): Payer: Medicare Other | Admitting: Orthopaedic Surgery

## 2021-06-23 DIAGNOSIS — Z89021 Acquired absence of right finger(s): Secondary | ICD-10-CM

## 2021-06-23 NOTE — Progress Notes (Signed)
HPI: [name] returns status post right second finger amputation through the DIP joint.  He is overall doing well.  He has no concerns other than has some difficulty moving finger through the PIP joint.  Physical exam: Right second finger amputation site is healing well no signs of infection sutures well approximate the incision.  There is no expressible purulence.  No abnormal warmth erythema or malodor.  Passively I can flex finger through the PIP joint without significant discomfort.  Impression: Status post right second finger amputation through the DIP joint 05/29/2021  Plan: Sutures removed.  He will wash the finger with an antibacterial soap.  Work on Social research officer, government.  He will also work on range of motion through the PIP joint.  We will see him back in just 2 weeks to check the wound.  He will return sooner if there is any signs of infections or questions.  Questions were encouraged and answered

## 2021-07-06 DIAGNOSIS — R0902 Hypoxemia: Secondary | ICD-10-CM | POA: Diagnosis not present

## 2021-07-06 DIAGNOSIS — J449 Chronic obstructive pulmonary disease, unspecified: Secondary | ICD-10-CM | POA: Diagnosis not present

## 2021-07-07 ENCOUNTER — Encounter: Payer: Medicare Other | Admitting: Physician Assistant

## 2021-07-20 DIAGNOSIS — R296 Repeated falls: Secondary | ICD-10-CM | POA: Diagnosis not present

## 2021-07-20 DIAGNOSIS — J449 Chronic obstructive pulmonary disease, unspecified: Secondary | ICD-10-CM | POA: Diagnosis not present

## 2021-07-20 DIAGNOSIS — M170 Bilateral primary osteoarthritis of knee: Secondary | ICD-10-CM | POA: Diagnosis not present

## 2021-07-20 DIAGNOSIS — M545 Low back pain, unspecified: Secondary | ICD-10-CM | POA: Diagnosis not present

## 2021-08-10 ENCOUNTER — Inpatient Hospital Stay (HOSPITAL_COMMUNITY): Payer: Medicare Other

## 2021-08-10 ENCOUNTER — Emergency Department (HOSPITAL_COMMUNITY): Payer: Medicare Other

## 2021-08-10 ENCOUNTER — Inpatient Hospital Stay (HOSPITAL_COMMUNITY)
Admission: EM | Admit: 2021-08-10 | Discharge: 2021-08-28 | DRG: 896 | Disposition: A | Discharge status: Skilled Nursing Facility | Payer: Medicare Other | Attending: Internal Medicine | Admitting: Internal Medicine

## 2021-08-10 DIAGNOSIS — G40901 Epilepsy, unspecified, not intractable, with status epilepticus: Secondary | ICD-10-CM | POA: Diagnosis present

## 2021-08-10 DIAGNOSIS — I82C12 Acute embolism and thrombosis of left internal jugular vein: Secondary | ICD-10-CM | POA: Diagnosis not present

## 2021-08-10 DIAGNOSIS — R2689 Other abnormalities of gait and mobility: Secondary | ICD-10-CM | POA: Diagnosis not present

## 2021-08-10 DIAGNOSIS — R0609 Other forms of dyspnea: Secondary | ICD-10-CM | POA: Diagnosis not present

## 2021-08-10 DIAGNOSIS — R000 Tachycardia, unspecified: Secondary | ICD-10-CM | POA: Diagnosis not present

## 2021-08-10 DIAGNOSIS — Q211 Atrial septal defect: Secondary | ICD-10-CM

## 2021-08-10 DIAGNOSIS — G319 Degenerative disease of nervous system, unspecified: Secondary | ICD-10-CM | POA: Diagnosis not present

## 2021-08-10 DIAGNOSIS — B951 Streptococcus, group B, as the cause of diseases classified elsewhere: Secondary | ICD-10-CM | POA: Diagnosis present

## 2021-08-10 DIAGNOSIS — R569 Unspecified convulsions: Secondary | ICD-10-CM

## 2021-08-10 DIAGNOSIS — E440 Moderate protein-calorie malnutrition: Secondary | ICD-10-CM | POA: Diagnosis present

## 2021-08-10 DIAGNOSIS — Z4682 Encounter for fitting and adjustment of non-vascular catheter: Secondary | ICD-10-CM | POA: Diagnosis not present

## 2021-08-10 DIAGNOSIS — R609 Edema, unspecified: Secondary | ICD-10-CM | POA: Diagnosis not present

## 2021-08-10 DIAGNOSIS — F10239 Alcohol dependence with withdrawal, unspecified: Principal | ICD-10-CM | POA: Diagnosis present

## 2021-08-10 DIAGNOSIS — R911 Solitary pulmonary nodule: Secondary | ICD-10-CM | POA: Diagnosis present

## 2021-08-10 DIAGNOSIS — I82612 Acute embolism and thrombosis of superficial veins of left upper extremity: Secondary | ICD-10-CM | POA: Diagnosis not present

## 2021-08-10 DIAGNOSIS — J984 Other disorders of lung: Secondary | ICD-10-CM

## 2021-08-10 DIAGNOSIS — I426 Alcoholic cardiomyopathy: Secondary | ICD-10-CM | POA: Diagnosis present

## 2021-08-10 DIAGNOSIS — Z8611 Personal history of tuberculosis: Secondary | ICD-10-CM | POA: Diagnosis not present

## 2021-08-10 DIAGNOSIS — E43 Unspecified severe protein-calorie malnutrition: Secondary | ICD-10-CM | POA: Diagnosis not present

## 2021-08-10 DIAGNOSIS — Z9981 Dependence on supplemental oxygen: Secondary | ICD-10-CM

## 2021-08-10 DIAGNOSIS — Z23 Encounter for immunization: Secondary | ICD-10-CM

## 2021-08-10 DIAGNOSIS — I5021 Acute systolic (congestive) heart failure: Secondary | ICD-10-CM | POA: Diagnosis present

## 2021-08-10 DIAGNOSIS — I081 Rheumatic disorders of both mitral and tricuspid valves: Secondary | ICD-10-CM | POA: Diagnosis not present

## 2021-08-10 DIAGNOSIS — R1310 Dysphagia, unspecified: Secondary | ICD-10-CM | POA: Diagnosis not present

## 2021-08-10 DIAGNOSIS — J690 Pneumonitis due to inhalation of food and vomit: Secondary | ICD-10-CM | POA: Diagnosis not present

## 2021-08-10 DIAGNOSIS — J9601 Acute respiratory failure with hypoxia: Secondary | ICD-10-CM | POA: Diagnosis present

## 2021-08-10 DIAGNOSIS — I10 Essential (primary) hypertension: Secondary | ICD-10-CM | POA: Diagnosis not present

## 2021-08-10 DIAGNOSIS — E871 Hypo-osmolality and hyponatremia: Secondary | ICD-10-CM | POA: Diagnosis present

## 2021-08-10 DIAGNOSIS — R404 Transient alteration of awareness: Secondary | ICD-10-CM | POA: Diagnosis not present

## 2021-08-10 DIAGNOSIS — Z79899 Other long term (current) drug therapy: Secondary | ICD-10-CM

## 2021-08-10 DIAGNOSIS — R7881 Bacteremia: Secondary | ICD-10-CM | POA: Diagnosis not present

## 2021-08-10 DIAGNOSIS — Z743 Need for continuous supervision: Secondary | ICD-10-CM | POA: Diagnosis not present

## 2021-08-10 DIAGNOSIS — E872 Acidosis: Secondary | ICD-10-CM | POA: Diagnosis present

## 2021-08-10 DIAGNOSIS — J449 Chronic obstructive pulmonary disease, unspecified: Secondary | ICD-10-CM | POA: Diagnosis not present

## 2021-08-10 DIAGNOSIS — I447 Left bundle-branch block, unspecified: Secondary | ICD-10-CM | POA: Diagnosis present

## 2021-08-10 DIAGNOSIS — Z4659 Encounter for fitting and adjustment of other gastrointestinal appliance and device: Secondary | ICD-10-CM

## 2021-08-10 DIAGNOSIS — Z20822 Contact with and (suspected) exposure to covid-19: Secondary | ICD-10-CM | POA: Diagnosis present

## 2021-08-10 DIAGNOSIS — G9341 Metabolic encephalopathy: Secondary | ICD-10-CM | POA: Diagnosis not present

## 2021-08-10 DIAGNOSIS — Q2547 Right aortic arch: Secondary | ICD-10-CM | POA: Diagnosis not present

## 2021-08-10 DIAGNOSIS — I2510 Atherosclerotic heart disease of native coronary artery without angina pectoris: Secondary | ICD-10-CM | POA: Diagnosis not present

## 2021-08-10 DIAGNOSIS — I82B12 Acute embolism and thrombosis of left subclavian vein: Secondary | ICD-10-CM | POA: Diagnosis not present

## 2021-08-10 DIAGNOSIS — I499 Cardiac arrhythmia, unspecified: Secondary | ICD-10-CM | POA: Diagnosis not present

## 2021-08-10 DIAGNOSIS — Z8249 Family history of ischemic heart disease and other diseases of the circulatory system: Secondary | ICD-10-CM

## 2021-08-10 DIAGNOSIS — I700 Atherosclerosis of aorta: Secondary | ICD-10-CM | POA: Diagnosis not present

## 2021-08-10 DIAGNOSIS — E512 Wernicke's encephalopathy: Secondary | ICD-10-CM | POA: Diagnosis not present

## 2021-08-10 DIAGNOSIS — Z9911 Dependence on respirator [ventilator] status: Secondary | ICD-10-CM | POA: Diagnosis not present

## 2021-08-10 DIAGNOSIS — K760 Fatty (change of) liver, not elsewhere classified: Secondary | ICD-10-CM | POA: Diagnosis present

## 2021-08-10 DIAGNOSIS — I110 Hypertensive heart disease with heart failure: Secondary | ICD-10-CM | POA: Diagnosis present

## 2021-08-10 DIAGNOSIS — Z6821 Body mass index (BMI) 21.0-21.9, adult: Secondary | ICD-10-CM

## 2021-08-10 DIAGNOSIS — F17210 Nicotine dependence, cigarettes, uncomplicated: Secondary | ICD-10-CM | POA: Diagnosis present

## 2021-08-10 DIAGNOSIS — Z993 Dependence on wheelchair: Secondary | ICD-10-CM

## 2021-08-10 DIAGNOSIS — Q7649 Other congenital malformations of spine, not associated with scoliosis: Secondary | ICD-10-CM | POA: Diagnosis not present

## 2021-08-10 DIAGNOSIS — M1990 Unspecified osteoarthritis, unspecified site: Secondary | ICD-10-CM | POA: Diagnosis present

## 2021-08-10 DIAGNOSIS — E11649 Type 2 diabetes mellitus with hypoglycemia without coma: Secondary | ICD-10-CM | POA: Diagnosis present

## 2021-08-10 DIAGNOSIS — Z8673 Personal history of transient ischemic attack (TIA), and cerebral infarction without residual deficits: Secondary | ICD-10-CM

## 2021-08-10 DIAGNOSIS — Z833 Family history of diabetes mellitus: Secondary | ICD-10-CM

## 2021-08-10 DIAGNOSIS — M6281 Muscle weakness (generalized): Secondary | ICD-10-CM | POA: Diagnosis not present

## 2021-08-10 DIAGNOSIS — R6889 Other general symptoms and signs: Secondary | ICD-10-CM | POA: Diagnosis not present

## 2021-08-10 DIAGNOSIS — F10231 Alcohol dependence with withdrawal delirium: Secondary | ICD-10-CM | POA: Diagnosis not present

## 2021-08-10 DIAGNOSIS — I509 Heart failure, unspecified: Secondary | ICD-10-CM | POA: Diagnosis not present

## 2021-08-10 DIAGNOSIS — J9690 Respiratory failure, unspecified, unspecified whether with hypoxia or hypercapnia: Secondary | ICD-10-CM | POA: Diagnosis not present

## 2021-08-10 DIAGNOSIS — A419 Sepsis, unspecified organism: Secondary | ICD-10-CM | POA: Diagnosis not present

## 2021-08-10 DIAGNOSIS — E162 Hypoglycemia, unspecified: Secondary | ICD-10-CM | POA: Diagnosis not present

## 2021-08-10 DIAGNOSIS — K8020 Calculus of gallbladder without cholecystitis without obstruction: Secondary | ICD-10-CM | POA: Diagnosis not present

## 2021-08-10 DIAGNOSIS — R262 Difficulty in walking, not elsewhere classified: Secondary | ICD-10-CM | POA: Diagnosis not present

## 2021-08-10 DIAGNOSIS — R1312 Dysphagia, oropharyngeal phase: Secondary | ICD-10-CM | POA: Diagnosis not present

## 2021-08-10 DIAGNOSIS — R410 Disorientation, unspecified: Secondary | ICD-10-CM | POA: Diagnosis not present

## 2021-08-10 DIAGNOSIS — B955 Unspecified streptococcus as the cause of diseases classified elsewhere: Secondary | ICD-10-CM | POA: Diagnosis not present

## 2021-08-10 DIAGNOSIS — E1165 Type 2 diabetes mellitus with hyperglycemia: Secondary | ICD-10-CM | POA: Diagnosis not present

## 2021-08-10 DIAGNOSIS — R0600 Dyspnea, unspecified: Secondary | ICD-10-CM | POA: Diagnosis not present

## 2021-08-10 DIAGNOSIS — J90 Pleural effusion, not elsewhere classified: Secondary | ICD-10-CM | POA: Diagnosis not present

## 2021-08-10 DIAGNOSIS — E876 Hypokalemia: Secondary | ICD-10-CM | POA: Diagnosis not present

## 2021-08-10 LAB — HEMOGLOBIN A1C
Hgb A1c MFr Bld: 4.8 % (ref 4.8–5.6)
Mean Plasma Glucose: 91.06 mg/dL

## 2021-08-10 LAB — RESP PANEL BY RT-PCR (FLU A&B, COVID) ARPGX2
Influenza A by PCR: NEGATIVE
Influenza B by PCR: NEGATIVE
SARS Coronavirus 2 by RT PCR: NEGATIVE

## 2021-08-10 LAB — CBC WITH DIFFERENTIAL/PLATELET
Abs Immature Granulocytes: 0.05 10*3/uL (ref 0.00–0.07)
Basophils Absolute: 0 10*3/uL (ref 0.0–0.1)
Basophils Relative: 0 %
Eosinophils Absolute: 0.1 10*3/uL (ref 0.0–0.5)
Eosinophils Relative: 1 %
HCT: 23.9 % — ABNORMAL LOW (ref 39.0–52.0)
Hemoglobin: 7.5 g/dL — ABNORMAL LOW (ref 13.0–17.0)
Immature Granulocytes: 1 %
Lymphocytes Relative: 13 %
Lymphs Abs: 0.6 10*3/uL — ABNORMAL LOW (ref 0.7–4.0)
MCH: 31.4 pg (ref 26.0–34.0)
MCHC: 31.4 g/dL (ref 30.0–36.0)
MCV: 100 fL (ref 80.0–100.0)
Monocytes Absolute: 0.5 10*3/uL (ref 0.1–1.0)
Monocytes Relative: 12 %
Neutro Abs: 3 10*3/uL (ref 1.7–7.7)
Neutrophils Relative %: 73 %
Platelets: 113 10*3/uL — ABNORMAL LOW (ref 150–400)
RBC: 2.39 MIL/uL — ABNORMAL LOW (ref 4.22–5.81)
RDW: 15.3 % (ref 11.5–15.5)
WBC: 4.2 10*3/uL (ref 4.0–10.5)
nRBC: 0 % (ref 0.0–0.2)

## 2021-08-10 LAB — URINALYSIS, ROUTINE W REFLEX MICROSCOPIC
Bilirubin Urine: NEGATIVE
Glucose, UA: NEGATIVE mg/dL
Ketones, ur: NEGATIVE mg/dL
Leukocytes,Ua: NEGATIVE
Nitrite: NEGATIVE
Protein, ur: NEGATIVE mg/dL
Specific Gravity, Urine: 1.012 (ref 1.005–1.030)
pH: 5 (ref 5.0–8.0)

## 2021-08-10 LAB — ECHOCARDIOGRAM COMPLETE
Height: 73 in
Weight: 2571.45 oz

## 2021-08-10 LAB — CBG MONITORING, ED: Glucose-Capillary: 127 mg/dL — ABNORMAL HIGH (ref 70–99)

## 2021-08-10 LAB — ETHANOL: Alcohol, Ethyl (B): <10 mg/dL (ref <10)

## 2021-08-10 LAB — I-STAT ARTERIAL BLOOD GAS, ED
Acid-Base Excess: 6 mmol/L — ABNORMAL HIGH (ref 0.0–2.0)
Bicarbonate: 29.3 mmol/L — ABNORMAL HIGH (ref 20.0–28.0)
Calcium, Ion: 1.19 mmol/L (ref 1.15–1.40)
HCT: 42 % (ref 39.0–52.0)
Hemoglobin: 14.3 g/dL (ref 13.0–17.0)
O2 Saturation: 100 %
Patient temperature: 98.8
Potassium: 3.3 mmol/L — ABNORMAL LOW (ref 3.5–5.1)
Sodium: 133 mmol/L — ABNORMAL LOW (ref 135–145)
TCO2: 30 mmol/L (ref 22–32)
pCO2 arterial: 38 mmHg (ref 32.0–48.0)
pH, Arterial: 7.496 — ABNORMAL HIGH (ref 7.350–7.450)
pO2, Arterial: 457 mmHg — ABNORMAL HIGH (ref 83.0–108.0)

## 2021-08-10 LAB — GLUCOSE, CAPILLARY
Glucose-Capillary: 44 mg/dL — CL (ref 70–99)
Glucose-Capillary: 159 mg/dL — ABNORMAL HIGH (ref 70–99)
Glucose-Capillary: <10 mg/dL — CL (ref 70–99)
Glucose-Capillary: 149 mg/dL — ABNORMAL HIGH (ref 70–99)
Glucose-Capillary: 229 mg/dL — ABNORMAL HIGH (ref 70–99)
Glucose-Capillary: <10 mg/dL — CL (ref 70–99)
Glucose-Capillary: 153 mg/dL — ABNORMAL HIGH (ref 70–99)

## 2021-08-10 LAB — COMPREHENSIVE METABOLIC PANEL
ALT: 21 U/L (ref 0–44)
AST: 30 U/L (ref 15–41)
Albumin: 1.3 g/dL — ABNORMAL LOW (ref 3.5–5.0)
Alkaline Phosphatase: 35 U/L — ABNORMAL LOW (ref 38–126)
Anion gap: 5 (ref 5–15)
BUN: <5 mg/dL — ABNORMAL LOW (ref 8–23)
CO2: 16 mmol/L — ABNORMAL LOW (ref 22–32)
Calcium: 5.2 mg/dL — CL (ref 8.9–10.3)
Chloride: 115 mmol/L — ABNORMAL HIGH (ref 98–111)
Creatinine, Ser: 0.46 mg/dL — ABNORMAL LOW (ref 0.61–1.24)
GFR, Estimated: >60 mL/min (ref >60)
Glucose, Bld: 78 mg/dL (ref 70–99)
Potassium: 2.5 mmol/L — CL (ref 3.5–5.1)
Sodium: 136 mmol/L (ref 135–145)
Total Bilirubin: 0.6 mg/dL (ref 0.3–1.2)
Total Protein: 3.9 g/dL — ABNORMAL LOW (ref 6.5–8.1)

## 2021-08-10 LAB — PROCALCITONIN: Procalcitonin: 5.2 ng/mL

## 2021-08-10 LAB — GLUCOSE, RANDOM: Glucose, Bld: 225 mg/dL — ABNORMAL HIGH (ref 70–99)

## 2021-08-10 LAB — BRAIN NATRIURETIC PEPTIDE: B Natriuretic Peptide: 401.9 pg/mL — ABNORMAL HIGH (ref 0.0–100.0)

## 2021-08-10 LAB — MRSA NEXT GEN BY PCR, NASAL: MRSA by PCR Next Gen: NOT DETECTED

## 2021-08-10 LAB — CK: Total CK: 85 U/L (ref 49–397)

## 2021-08-10 LAB — MAGNESIUM: Magnesium: 1.3 mg/dL — ABNORMAL LOW (ref 1.7–2.4)

## 2021-08-10 LAB — CORTISOL: Cortisol, Plasma: 9.8 ug/dL

## 2021-08-10 LAB — LACTIC ACID, PLASMA: Lactic Acid, Venous: 3.3 mmol/L (ref 0.5–1.9)

## 2021-08-10 LAB — HIV ANTIBODY (ROUTINE TESTING W REFLEX): HIV Screen 4th Generation wRfx: NONREACTIVE

## 2021-08-10 MED ORDER — PANTOPRAZOLE SODIUM 40 MG PO PACK
40.0000 mg | PACK | Freq: Every day | ORAL | Status: DC
  Administered 2021-08-11: 40 mg
  Filled 2021-08-10 (×2): qty 20

## 2021-08-10 MED ORDER — LORAZEPAM 2 MG/ML IJ SOLN
1.0000 mg | Freq: Once | INTRAMUSCULAR | Status: AC
  Administered 2021-08-10: 1 mg via INTRAVENOUS
  Filled 2021-08-10: qty 1

## 2021-08-10 MED ORDER — FENTANYL CITRATE PF 50 MCG/ML IJ SOSY
50.0000 ug | PREFILLED_SYRINGE | INTRAMUSCULAR | Status: DC | PRN
  Filled 2021-08-10 (×2): qty 1

## 2021-08-10 MED ORDER — LEVETIRACETAM IN NACL 1000 MG/100ML IV SOLN
1000.0000 mg | Freq: Once | INTRAVENOUS | Status: AC
  Administered 2021-08-10: 1000 mg via INTRAVENOUS
  Filled 2021-08-10: qty 100

## 2021-08-10 MED ORDER — ORAL CARE MOUTH RINSE
15.0000 mL | OROMUCOSAL | Status: DC
  Administered 2021-08-10 – 2021-08-12 (×17): 15 mL via OROMUCOSAL

## 2021-08-10 MED ORDER — POTASSIUM CHLORIDE 10 MEQ/100ML IV SOLN
10.0000 meq | Freq: Once | INTRAVENOUS | Status: AC
  Administered 2021-08-10: 10 meq via INTRAVENOUS
  Filled 2021-08-10: qty 100

## 2021-08-10 MED ORDER — DOCUSATE SODIUM 50 MG/5ML PO LIQD: 100.0000 mg | Freq: Two times a day (BID) | ORAL | Status: DC | PRN

## 2021-08-10 MED ORDER — DEXTROSE 50 % IV SOLN: 25.0000 g | INTRAVENOUS | Status: AC

## 2021-08-10 MED ORDER — ROCURONIUM BROMIDE 50 MG/5ML IV SOLN
INTRAVENOUS | Status: AC | PRN
  Administered 2021-08-10: 70 mg via INTRAVENOUS

## 2021-08-10 MED ORDER — PANTOPRAZOLE SODIUM 40 MG PO PACK: 40.0000 mg | PACK | Freq: Every day | ORAL | Status: DC

## 2021-08-10 MED ORDER — FENTANYL 2500MCG IN NS 250ML (10MCG/ML) PREMIX INFUSION
0.0000 ug/h | INTRAVENOUS | Status: DC
  Administered 2021-08-10: 25 ug/h via INTRAVENOUS
  Filled 2021-08-10: qty 250

## 2021-08-10 MED ORDER — DEXTROSE 50 % IV SOLN: 25.0000 g | Freq: Once | INTRAVENOUS | Status: AC

## 2021-08-10 MED ORDER — CHLORHEXIDINE GLUCONATE CLOTH 2 % EX PADS
6.0000 | MEDICATED_PAD | Freq: Every day | CUTANEOUS | Status: DC
  Administered 2021-08-10 – 2021-08-28 (×18): 6 via TOPICAL

## 2021-08-10 MED ORDER — MIDAZOLAM BOLUS VIA INFUSION
0.0000 mg | INTRAVENOUS | Status: DC | PRN
  Administered 2021-08-10 (×2): 5 mg via INTRAVENOUS
  Filled 2021-08-10: qty 5

## 2021-08-10 MED ORDER — MIDAZOLAM-SODIUM CHLORIDE 100-0.9 MG/100ML-% IV SOLN
0.0000 mg/h | INTRAVENOUS | Status: DC
Start: 2021-08-10 — End: 2021-08-11
  Administered 2021-08-10: 5 mg/h via INTRAVENOUS
  Administered 2021-08-10 – 2021-08-11 (×2): 8 mg/h via INTRAVENOUS
  Filled 2021-08-10 (×3): qty 100

## 2021-08-10 MED ORDER — DEXTROSE IN LACTATED RINGERS 5 % IV SOLN: INTRAVENOUS | Status: DC

## 2021-08-10 MED ORDER — LEVETIRACETAM IN NACL 1000 MG/100ML IV SOLN
1000.0000 mg | Freq: Two times a day (BID) | INTRAVENOUS | Status: DC
  Administered 2021-08-10 – 2021-08-16 (×13): 1000 mg via INTRAVENOUS
  Filled 2021-08-10 (×13): qty 100

## 2021-08-10 MED ORDER — CHLORHEXIDINE GLUCONATE 0.12% ORAL RINSE (MEDLINE KIT)
15.0000 mL | Freq: Two times a day (BID) | OROMUCOSAL | Status: DC
  Administered 2021-08-10 – 2021-08-28 (×34): 15 mL via OROMUCOSAL

## 2021-08-10 MED ORDER — IPRATROPIUM-ALBUTEROL 0.5-2.5 (3) MG/3ML IN SOLN
3.0000 mL | Freq: Four times a day (QID) | RESPIRATORY_TRACT | Status: DC
  Administered 2021-08-10 – 2021-08-12 (×7): 3 mL via RESPIRATORY_TRACT
  Filled 2021-08-10 (×7): qty 3

## 2021-08-10 MED ORDER — BUDESONIDE 0.25 MG/2ML IN SUSP
0.2500 mg | Freq: Two times a day (BID) | RESPIRATORY_TRACT | Status: DC
  Administered 2021-08-10: 0.25 mg via RESPIRATORY_TRACT
  Filled 2021-08-10: qty 2

## 2021-08-10 MED ORDER — LORAZEPAM 2 MG/ML IJ SOLN
2.0000 mg | Freq: Once | INTRAMUSCULAR | Status: AC
  Administered 2021-08-10: 2 mg via INTRAVENOUS
  Filled 2021-08-10: qty 1

## 2021-08-10 MED ORDER — LORAZEPAM 2 MG/ML IJ SOLN
1.0000 mg | Freq: Once | INTRAMUSCULAR | Status: AC
  Administered 2021-08-10: 1 mg via INTRAVENOUS

## 2021-08-10 MED ORDER — INSULIN ASPART 100 UNIT/ML IJ SOLN
0.0000 [IU] | INTRAMUSCULAR | Status: DC
  Administered 2021-08-10: 2 [IU] via SUBCUTANEOUS
  Administered 2021-08-11 (×3): 1 [IU] via SUBCUTANEOUS
  Administered 2021-08-12: 2 [IU] via SUBCUTANEOUS
  Administered 2021-08-12: 1 [IU] via SUBCUTANEOUS
  Administered 2021-08-13: 2 [IU] via SUBCUTANEOUS
  Administered 2021-08-14 – 2021-08-19 (×7): 1 [IU] via SUBCUTANEOUS

## 2021-08-10 MED ORDER — FENTANYL CITRATE PF 50 MCG/ML IJ SOSY
50.0000 ug | PREFILLED_SYRINGE | INTRAMUSCULAR | Status: AC | PRN
  Administered 2021-08-10 – 2021-08-12 (×3): 50 ug via INTRAVENOUS
  Filled 2021-08-10 (×2): qty 1

## 2021-08-10 MED ORDER — CALCIUM GLUCONATE-NACL 1-0.675 GM/50ML-% IV SOLN
1.0000 g | Freq: Once | INTRAVENOUS | Status: AC
  Administered 2021-08-10: 1000 mg via INTRAVENOUS
  Filled 2021-08-10: qty 50

## 2021-08-10 MED ORDER — DEXTROSE 50 % IV SOLN
INTRAVENOUS | Status: AC
  Administered 2021-08-10: 25 g via INTRAVENOUS
  Filled 2021-08-10: qty 50

## 2021-08-10 MED ORDER — POLYETHYLENE GLYCOL 3350 17 G PO PACK: 17.0000 g | PACK | Freq: Every day | ORAL | Status: DC | PRN

## 2021-08-10 MED ORDER — SODIUM CHLORIDE 0.9 % IV SOLN
20.0000 mg/kg | Freq: Once | INTRAVENOUS | Status: AC
  Administered 2021-08-10: 1500 mg via INTRAVENOUS
  Filled 2021-08-10: qty 30

## 2021-08-10 MED ORDER — ETOMIDATE 2 MG/ML IV SOLN
INTRAVENOUS | Status: AC | PRN
  Administered 2021-08-10: 20 mg via INTRAVENOUS

## 2021-08-10 MED ORDER — ONDANSETRON HCL 4 MG/2ML IJ SOLN: 4.0000 mg | Freq: Four times a day (QID) | INTRAMUSCULAR | Status: DC | PRN

## 2021-08-10 NOTE — Progress Notes (Signed)
EEG complete - results pending 

## 2021-08-10 NOTE — ED Notes (Signed)
Pt's wife called, Katharine Look [phone]

## 2021-08-10 NOTE — ED Notes (Signed)
Attempted to call report

## 2021-08-10 NOTE — H&P (Addendum)
NAME:  Frederick [name], MRN:  [id number], DOB:  May 05, 1961, LOS: 0 ADMISSION DATE:  08/10/2021, CONSULTATION DATE:  08/10/2022 REFERRING MD:  , CHIEF COMPLAINT:  Seizure with airway compromise  All information obtained from Medical records  History of Present Illness:  Frederick [name] is an 60 y.o. male  current every day smoker , chronic heavy beer drinker (significantly > 80 oz per day per wife) with more recent escalation of intake to include hard liquor, but with decreased intake yesterday relative to his baseline. (Drank "only" two 40 oz beers per wife) presenting from home with a 1 week history of recurrent focal seizure activity based on wife's description. She had called EMS for what she felt was a stroke. On EMS arrival, he was actively seizing with GTC seizure and was administered 5 mg IM Versed which stopped the seizure. CBG was 70 in the field per EMS,  without cessation of seizure activity. O2 was originally 80% on room air however he went up to 93 on nasal cannula. In the ED he received 2 mg IV Ativan and 3000 mg IV Keppra, also without cessation.   Pt decompensated and was intubated for airway protection in setting of seizures in the ED after second dose of Ativan.   In the ED Head imaging was negative for acute intracranial abnormalities. 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  Of note patient has a history of TB which was diagnosed in 2001. He was treated at that time by the [place] x 6 months. Pt had recurrence in 2016 , and was treated with  isoniazid, rifampin, ethambutol, pyrazinamide and B6 by the [place].  Labs reviewed : K 2.5, Chloride 115, CO2 16, Creatinine 0.47,Calcium 5.2, Albumin 1.3, Alk Phos 35 HGB 7.5, WBC 4.2, platelets of 113 T Max of 98.8  CXR 9/5>>   Extensive chronic lung disease changes with  peribronchial thickening, chronic nodularity and chronic interstitial disease.   Chronic scarring and volume loss in RIGHT upper lobe with mediastinal shift to RIGHT  Pertinent  Medical History   Past Medical History:  Diagnosis Date   Arthritis    Asthma    Bilateral lower extremity edema 05/28/2021   propping feet up   Cough 05/28/2021   with clear sputum   Dyspnea    Dyspnea 05/28/2021   with exertion   EtOH dependence (HCC)    drinks 2-3 of 40ounes a day   Fibula fracture    Dr Mardelle Matte 02-2011   Finger osteomyelitis, right ([place]) 05/26/2021   right index finger   Hernia    surgery 01-17-13   Hypertension    not taking bp meds since may 2022   Tuberculosis    couple of yrs ago took tx at [facility] per pt on 05-28-2021     Significant Hospital Events: Including procedures, antibiotic start and stop dates in addition to other pertinent events   Admission 08/10/2022  Interim History / Subjective:  Stable on vent , sedated with versed, MAE x 4, but non-purposeful, does not follow commands. Versed infusing at 6 mg/hr  Objective   Blood pressure 112/85, pulse (!) 135, temperature 98.8 F (37.1 C), temperature source Axillary, resp. rate 15, height '6\' 1"'  (1.854 m), weight 75 kg, SpO2 100 %.    Vent Mode: PRVC FiO2 (%):  [100 %]  100 % Set Rate:  [15 bmp] 15 bmp Vt Set:  [630 mL] 630 mL PEEP:  [5 cmH20] 5 cmH20 Plateau Pressure:  [15 cmH20] 15 cmH20  No intake or output data in the 24 hours ending 08/10/21 1310 Filed Weights   08/10/21 1000  Weight: 75 kg    Examination: General: Sedated and intubated on vent HENT: NCAT, No JVD, No LAD, ETT is secure and intact, OG tube secure and intact Lungs: Bilateral chest excursion , rhonchi with scant wheezing, diminished per bases Cardiovascular: S1, S2, RRR, No RMG, brisk  refill Abdomen:  Soft, flat, non-tender, BS +, Body mass index is 21.81 kg/m. Extremities: No obvious deformities Neuro: sedated  and intubated , Right side with purposeful movement  GU: NA  Resolved Hospital Problem list     Assessment & Plan:  Acute Respiratory Failure in setting of new onset Seizures/ Suspected ETOH withdrawal. History of Extensive Chronic Pulmonary Disease Peribronchial thickening, chronic nodularity and chronic interstitial disease. History of TB diagnosed in 2016 Plan Wean FiO2 and PEEP as able CT Chest without contrast Versed for sedation , consider transitioning to Propofol RASS Goal of -2, and suppression of seizure activity No SBT for now ABG in am and prn CXR in am and prn Duonebs and Pulmicort nebs Check Mag , goal of > 2  Seizures in setting of possible ETOH withdrawal No acute intracranial abnormalities per Imaging Initial EEG>> diffuse encephalopathy , non-specific encephalopathy, No seizures Plan Per Neuro Keppra Load and maintenance  Cerebyx Continuous EEG Seizure precautions  Follow up imaging per Neuro Sedation as noted above for seizure suppression Check lactate and CK   Hypokalemia Plan Trend BMET Replete electrolytes as needed   Calcium of 5.2>> Corrects to 7.4 Calcium Gluconate 1 gram  Check ionized calcium in am Start nutrition  Albumin 1.3 Plan Initiate TF per Dietary    Endocrine Plan CBG Q 4 SSI  Best Practice (right click and "Reselect all SmartList Selections" daily)   Diet/type: NPO DVT prophylaxis: SCD GI prophylaxis: PPI Lines: N/A Foley:  N/A Code Status:  full code Last date of multidisciplinary goals of care discussion [ Dr. Lamonte Sakai Attempted to call wife to update 08/10/2021. There was no answer  Labs   CBC: Recent Labs  Lab 08/10/21 1010 08/10/21 1244  WBC 4.2  --   NEUTROABS 3.0  --   HGB 7.5* 14.3  HCT 23.9* 42.0  MCV 100.0  --   PLT 113*  --     Basic Metabolic Panel: Recent Labs  Lab 08/10/21 1010 08/10/21 1244  NA 136 133*  K 2.5* 3.3*  CL 115*  --   CO2 16*  --   GLUCOSE 78  --   BUN <5*  --    CREATININE 0.46*  --   CALCIUM 5.2*  --    GFR: Estimated Creatinine Clearance: 102.9 mL/min (A) (by C-G formula based on SCr of 0.46 mg/dL (L)). Recent Labs  Lab 08/10/21 1010  WBC 4.2    Liver Function Tests: Recent Labs  Lab 08/10/21 1010  AST 30  ALT 21  ALKPHOS 35*  BILITOT 0.6  PROT 3.9*  ALBUMIN 1.3*   No results for input(s): LIPASE, AMYLASE in the last 168 hours. No results for input(s): AMMONIA in the last 168 hours.  ABG    Component Value Date/Time   PHART 7.496 (H) 08/10/2021 1244   PCO2ART 38.0 08/10/2021 1244   PO2ART 457 (H) 08/10/2021 1244   HCO3 29.3 (H) 08/10/2021  1244   TCO2 30 08/10/2021 1244   O2SAT 100.0 08/10/2021 1244     Coagulation Profile: No results for input(s): INR, PROTIME in the last 168 hours.  Cardiac Enzymes: No results for input(s): CKTOTAL, CKMB, CKMBINDEX, TROPONINI in the last 168 hours.  HbA1C: No results found for: HGBA1C  CBG: Recent Labs  Lab 08/10/21 1012  GLUCAP 127*    Review of Systems:   Unable as sedated and intubated  Past Medical History:  He,  has a past medical history of Arthritis, Asthma, Bilateral lower extremity edema (05/28/2021), Cough (05/28/2021), Dyspnea, Dyspnea (05/28/2021), EtOH dependence ([place]), Fibula fracture, Finger osteomyelitis, right ([place]) (05/26/2021), Hernia, Hypertension, and Tuberculosis.   Surgical History:   Past Surgical History:  Procedure Laterality Date   AMPUTATION Right 05/29/2021   Procedure: PARTIAL RIGHT 2ND FINGER AMPUTATION;  Surgeon: Mcarthur Rossetti, MD;  Location: [facility];  Service: Orthopedics;  Laterality: Right;   ANKLE SURGERY     INGUINAL HERNIA REPAIR Right 01/19/2013   Procedure: HERNIA REPAIR INGUINAL ADULT;  Surgeon: Gayland Curry, MD,FACS;  Location: [facility];  Service: General;  Laterality: Right;   TIBIA IM NAIL INSERTION Left 01/22/2013   Procedure: INTRAMEDULLARY (IM) NAIL TIBIAL;  Surgeon: Johnny Bridge, MD;  Location:  [facility];  Service: Orthopedics;  Laterality: Left;     Social History:   reports that he has been smoking cigarettes. He has been smoking an average of .5 packs per day. He has never used smokeless tobacco. He reports current alcohol use. He reports that he does not use drugs.   Family History:  His family history includes Diabetes in his father and mother; Hypertension in his father.   Allergies No Known Allergies   Home Medications  Prior to Admission medications   Medication Sig Start Date End Date Taking? Authorizing Provider  albuterol (VENTOLIN HFA) 108 (90 Base) MCG/ACT inhaler Inhale 2 puffs into the lungs every 4 (four) hours as needed. 12/06/19   [provider]  chlordiazePOXIDE (LIBRIUM) 25 MG capsule 42m PO TID x 1D, then 25-560mPO BID X 1D, then 25-5059mO QD X 1D Patient not taking: No sig reported 04/17/20   GeiCarlisle CaterA-C  doxycycline (VIBRAMYCIN) 100 MG capsule Take 1 capsule (100 mg total) by mouth 2 (two) times daily. 05/20/21   JolRayna SextonA-C  fluticasone (FLONASE) 50 MCG/ACT nasal spray Place 1 spray into both nostrils daily. Patient taking differently: Place 1 spray into both nostrils daily as needed for allergies. 07/28/15   Hedges, JefDellis FilbertA-C  gentamicin cream (GARAMYCIN) 0.1 % Apply between right 4th and 5th toes once daily Patient not taking: No sig reported 06/06/20   GalMarzetta BoardPM  HYDROcodone-acetaminophen (NORCO/VICODIN) 5-325 MG tablet Take 1-2 tablets by mouth every 6 (six) hours as needed for moderate pain. 05/29/21 05/29/22  BlaMcarthur RossettiD  methocarbamol (ROBAXIN) 500 MG tablet Take 500 mg by mouth 2 (two) times daily as needed for muscle spasms.  04/03/20   [provider]  Multiple Vitamin (MULTIVITAMIN WITH MINERALS) TABS tablet Take 1 tablet by mouth daily. 05/31/19   Horton, CouBarbette HairD  Naftifine HCl 2 % GEL Apply one application daily between affected toes after bathing and drying well, let dry  before going into socks/shoes 07/14/20   McDonald, AdaStephan MinisterPM  OXYGEN Inhale into the lungs. At hs not using not sure amount used    [provider]  SPIRIVA HANDIHALER 18 MCG inhalation capsule  04/21/20   [provider]  SPIRIVA RESPIMAT 2.5 MCG/ACT AERS Inhale 2 sprays into the lungs every other day.  Patient not taking: No sig reported 11/24/15   [provider]  triamcinolone cream (KENALOG) 0.1 % Apply 1 application topically 2 (two) times daily. 05/06/20   Faustino Congress, NP  ferrous sulfate 325 (65 FE) MG tablet Take 1 tablet (325 mg total) by mouth 2 (two) times daily with a meal. Patient not taking: Reported on 02/12/2020 12/03/15 04/17/20  Janece Canterbury, MD  ipratropium-albuterol (DUONEB) 0.5-2.5 (3) MG/3ML SOLN Take 3 mLs by nebulization every 6 (six) hours as needed. Patient not taking: Reported on 02/12/2020 12/02/15 04/17/20  Hosie Poisson, MD  zolpidem (AMBIEN) 5 MG tablet Take 1 tablet (5 mg total) by mouth at bedtime as needed for sleep. Patient not taking: Reported on 02/12/2020 12/02/15 04/17/20  Hosie Poisson, MD     Critical care time: 50 minutes   Magdalen Spatz, MSN, AGACNP-BC [facility] for personal pager PCCM on call pager [phone]  08/10/2021 3:10 PM

## 2021-08-10 NOTE — Progress Notes (Signed)
Started cEEG study in ED.  Tested patient event button.

## 2021-08-10 NOTE — Progress Notes (Signed)
Pt moved to 4N15 with EEG w/o issues. Pt connected and monitored through [facility]

## 2021-08-10 NOTE — Progress Notes (Signed)
  Echocardiogram 2D Echocardiogram has been performed.  Roosvelt Maser F 08/10/2021, 5:56 PM

## 2021-08-10 NOTE — Progress Notes (Signed)
Hypoglycemic Event  CBG: <10  Treatment: D50 50 mL (25 gm)  Symptoms: None  Follow-up CBG: Time:1957 CBG Result:149  Possible Reasons for Event: Unknown  Comments/MD notified: Follow-up CBG from finger stick was <10. Retook CBG on ear lobe read 229. CBG on toe read 149. Notified E-link nurse.     Shary Decamp

## 2021-08-10 NOTE — ED Notes (Signed)
Pt given 1 mg remainder of Ativan, 1 mg from previous pulled vial. Pharmacy at bedside as witness

## 2021-08-10 NOTE — Progress Notes (Signed)
CBG on arrival from ED was 44, amp of d5o given per protocol. Blood glucose rechecked and is 159.

## 2021-08-10 NOTE — Progress Notes (Addendum)
eLink Physician-Brief Progress Note Patient Name: Frederick [name] DOB: [birth year] MRN: [id number]   Date of Service  08/10/2021  HPI/Events of Note  Pt hypoglycemic to 30's.  D50 push given  eICU Interventions  Inc'ed rate of D5LR from 30/hr to 125/hr.  Also, pt restless.  Had received mult boluses of fent during day shift; added a fent gtt.    Addendum:  fingersticks were not accurate; repeat draws from other sites showed widely variable readings. Will check serum lab glucose values instead  Intervention Category Intermediate Interventions: Electrolyte abnormality - evaluation and management  Jacinta Shoe 08/10/2021, 7:52 PM

## 2021-08-10 NOTE — Progress Notes (Signed)
Sent Dr. Delton Coombes secure chat and let MD know that CBG on arrival to ICU was 44 treated with amp of d50 and cbg came up to 129. Also made MD aware that lactic acid 3.3, MD acknowledged all stated above and ordering IVF.

## 2021-08-10 NOTE — Progress Notes (Signed)
Pt transported from on vent from ED to 4N15  without any complications. Vitals stable, RN at bedside, RT will continue to monitor.

## 2021-08-10 NOTE — ED Triage Notes (Signed)
Pt arrives from home via EMS. (EMS reports seeing insects in house, possible bed bugs and filth on sheets in bed).  Pt was on bed tonic clonic-seizing per EMS. Pt's wife was not able to provide much information, but does not thing pt has hx of seizures. Unknown alcohol use. Wheelchair present but wife does not know why pt uses wheelchair, unknown if pt is ambulatory. 20g IV established in LFA. 5 mg versed IV administered. Seizure stopped. Pt is postictal upon arrival to ED. No injuries noted.  BP 166/100 HR 120 O2 80'r RA, 93% on 4L [place] BGL 70

## 2021-08-10 NOTE — Consult Note (Addendum)
NEURO HOSPITALIST CONSULT NOTE   Requestig physician: Dr. Rubin PayorPickering  Reason for Consult: Status epilepticus  History obtained from:  EDP and Chart     HPI:                                                                                                                                          Frederick [name] is an 60 y.o. male chronic heavy beer drinker (significantly > 80 oz per day per wife) with more recent escalation of intake to include hard liquor, but with decreased intake yesterday relative to his baseline (drank "only" two 40 oz beers per wife) presenting from home with a 1 week history of recurrent focal seizure activity based on wife's description. She had called EMS for what she felt was a stroke. On EMS arrival, he was actively seizing with GTC seizure and was administered 5 mg IM Versed which stopped the seizure. CBG was 70 per EMS. in the field without cessation of seizure activity. O2 was originally 80% on room air however he went up to 93 on nasal cannula. In the ED he received 2 mg IV Ativan and 3000 mg IV Keppra, also without cessation.   His PMHx includes HTN, dyspnea with exertion, right finger osteomyelitis s/p distal amputation and TB. He has no known history of seizure or stroke.   Past Medical History:  Diagnosis Date   Arthritis    Asthma    Bilateral lower extremity edema 05/28/2021   propping feet up   Cough 05/28/2021   with clear sputum   Dyspnea    Dyspnea 05/28/2021   with exertion   EtOH dependence (HCC)    drinks 2-3 of 40ounes a day   Fibula fracture    Dr Dion SaucierLandau 02-2011   Finger osteomyelitis, right (HCC) 05/26/2021   right index finger   Hernia    surgery 01-17-13   Hypertension    not taking bp meds since may 2022   Tuberculosis    [facility]rs [facility]    Past Surgical History:  Procedure Laterality Date   AMPUTATION Right 05/29/2021   Procedure: PARTIAL RIGHT  2ND FINGER AMPUTATION;  Surgeon: Kathryne HitchBlackman, Christopher Y, MD;  [facility] [place];  Service: Orthopedics;  Laterality: Right;   ANKLE SURGERY     INGUINAL HERNIA REPAIR Right 01/19/2013   Procedure: HERNIA REPAIR INGUINAL ADULT;  Surgeon: Atilano InaEric M Wilson, MD,FACS;  Location: [facility] OR;  Service: General;  Laterality: Right;   TIBIA IM NAIL INSERTION Left 01/22/2013   Procedure: INTRAMEDULLARY (IM) NAIL TIBIAL;  Surgeon: Eulas PostJoshua P Landau, MD;  Location: [facility] OR;  Service: Orthopedics;  Laterality: Left;    Family History  Problem Relation Age of  Onset   Diabetes Mother    Diabetes Father    Hypertension Father              Social History:  reports that he has been smoking cigarettes. He has been smoking an average of .5 packs per day. He has never used smokeless tobacco. He reports current alcohol use. He reports that he does not use drugs.  No Known Allergies  HOME MEDICATIONS:                                                                                                                      No current facility-administered medications on file prior to encounter.   Current Outpatient Medications on File Prior to Encounter  Medication Sig Dispense Refill   albuterol (VENTOLIN HFA) 108 (90 Base) MCG/ACT inhaler Inhale 2 puffs into the lungs every 4 (four) hours as needed.     chlordiazePOXIDE (LIBRIUM) 25 MG capsule 50mg  PO TID x 1D, then 25-50mg  PO BID X 1D, then 25-50mg  PO QD X 1D (Patient not taking: No sig reported) 10 capsule 0   doxycycline (VIBRAMYCIN) 100 MG capsule Take 1 capsule (100 mg total) by mouth 2 (two) times daily. 20 capsule 0   fluticasone (FLONASE) 50 MCG/ACT nasal spray Place 1 spray into both nostrils daily. (Patient taking differently: Place 1 spray into both nostrils daily as needed for allergies.) 16 g 2   gentamicin cream (GARAMYCIN) 0.1 % Apply between right 4th and 5th toes once daily (Patient not taking: No sig reported) 30 g 0    HYDROcodone-acetaminophen (NORCO/VICODIN) 5-325 MG tablet Take 1-2 tablets by mouth every 6 (six) hours as needed for moderate pain. 20 tablet 0   methocarbamol (ROBAXIN) 500 MG tablet Take 500 mg by mouth 2 (two) times daily as needed for muscle spasms.      Multiple Vitamin (MULTIVITAMIN WITH MINERALS) TABS tablet Take 1 tablet by mouth daily. 30 tablet 0   Naftifine HCl 2 % GEL Apply one application daily between affected toes after bathing and drying well, let dry before going into socks/shoes 45 g 2   OXYGEN Inhale into the lungs. At hs not using not sure amount used     SPIRIVA HANDIHALER 18 MCG inhalation capsule  (Patient not taking: No sig reported)     SPIRIVA RESPIMAT 2.5 MCG/ACT AERS Inhale 2 sprays into the lungs every other day.  (Patient not taking: No sig reported)  5   triamcinolone cream (KENALOG) 0.1 % Apply 1 application topically 2 (two) times daily. 30 g 0   [DISCONTINUED] ferrous sulfate 325 (65 FE) MG tablet Take 1 tablet (325 mg total) by mouth 2 (two) times daily with a meal. (Patient not taking: Reported on 02/12/2020) 60 tablet 0   [DISCONTINUED] ipratropium-albuterol (DUONEB) 0.5-2.5 (3) MG/3ML SOLN Take 3 mLs by nebulization every 6 (six) hours as needed. (Patient not taking: Reported on 02/12/2020) 360 mL 1   [DISCONTINUED] zolpidem (AMBIEN) 5 MG tablet Take 1 tablet (5 mg  total) by mouth at bedtime as needed for sleep. (Patient not taking: Reported on 02/12/2020) 10 tablet 0     ROS:                                                                                                                                       Unable to obtain due to AMS.    Blood pressure (!) 138/102, temperature 98.8 F (37.1 C), temperature source Axillary, resp. rate (!) 30, weight 79.4 kg, SpO2 92 %.   General Examination:                                                                                                       Physical Exam  HEENT-  [place]/AT.   Lungs-Sonorous respirations in the  context of ongoing seizure activity Abdomen- Continuous rhythmic twitching of left abdomen Extremities- Warm to touch. Right finger amputation and left foot skin lesion noted.   Neurological Examination Mental Status: Flexes and moves RUE semipurposefully after noxious stimulation. Nonverbal with eyes closed. Not following any commands. Not responding to any auditory stimuli.  Cranial Nerves: II: No blink to threat bilaterally. Pupils 3 mm and reactive to 2 mm bilaterally.  III,IV, VI: Eyes slightly disconjugate (esotropic) and rotated several mm to the left, but no jerking movements of globes are noted.  V,VII: Grimaces equally to noxious brow ridge pressure. No facial twitching noted.  VIII: No responses to auditory stimuli IX,X: Unable to assess XI: Head is midline XII: Unable to assess Motor/Sensory: RUE: Flexes and moves semipurposefully to noxious stimuli. Increased flexor tone.  LUE: Flaccid tone with no movement to noxious.  RLE: Withdraws briskly to noxious plantar stimulation. Tone mildly increased  LLE: Withdraws to noxious but significantly less briskly than on the right. Tone decreased.  Deep Tendon Reflexes: Hypoactive on the left.  Plantars: Right: Downgoing   Left: [place] Cerebellar/Gait: Unable to assess   Lab Results: Basic Metabolic Panel: No results for input(s): NA, K, CL, CO2, GLUCOSE, BUN, CREATININE, CALCIUM, MG, PHOS in the last 168 hours.  CBC: Recent Labs  Lab 08/10/21 1010  WBC 4.2  NEUTROABS 3.0  HGB 7.5*  HCT 23.9*  MCV 100.0  PLT 113*    Cardiac Enzymes: No results for input(s): CKTOTAL, CKMB, CKMBINDEX, TROPONINI in the last 168 hours.  Lipid Panel: No results for input(s): CHOL, TRIG, HDL, CHOLHDL, VLDL, LDLCALC in the last 168 hours.  Imaging: No results found.   Assessment: 60 year old male, chronic  heavy beer drinker (significantly > 80 oz per day per wife) with more recent escalation of intake to include hard liquor, but with  decreased intake yesterday relative to his baseline (drank "only" two 40 oz beers per wife) presenting from home with a 1 week history of recurrent focal seizure activity based on wife's description. She had called EMS for what she felt was a stroke. On EMS arrival, he was actively seizing with GTC seizure and was administered 5 mg IM Versed which stopped the seizure. CBG was 70 per EMS. in the field without cessation of seizure activity. O2 was originally 80% on room air however he went up to 93 on nasal cannula. In the ED he received 2 mg IV Ativan and 3000 mg IV Keppra, also without cessation.  1. Exam reveals continuous left sided jerking of abdominal muscles and RLE in conjunction with encephalopathic state, the latter most likely either due to postictal state versus continuous seizure activity.  2. DDx for his status epilepticus includes ICH, stroke, herpes encephalitis, other CNS infection and EtOH withdrawal.  3. Glucose level not significantly elevated in the ED and he was not hypoglycemic per EMS 4. Decompensating in terms of ability to protect airway after second dose of Ativan 2 mg IV. EDP has determined that the patient will need to be emergently intubated.     Recommendations: 1. STAT CT head 2. STAT LTM EEG after CT 3. Loading with fosphenytoin 20 mg PE/kg. If he is still seizing after the load, will need to start on IV Versed or propofol drip.  4. Frequent neuro checks.  5. Seizure precautions.  6. May need LP and/or MRI brain. Further recommendations pending CT head and EEG results.   45 minutes spent in the emergent neurological evaluation and management of this critically ill patient.   Addendum: - CT head: No acute intracranial abnormalities. Atrophy with small vessel chronic ischemic changes of deep cerebral white matter. 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 - EEG: Continuous slow, generalized. This study is suggestive of severe diffuse encephalopathy,  nonspecific etiology but likely related to sedation. No seizures or epileptiform discharges were seen throughout the recording.  Addendum (4:00 PM): - Examined on 4N. No seizure activity clinically. EEG monitor without electrographic seizure on low amplitude background.  - Per nursing staff, no witnessed seizure activity since arrival to the floor - On Versed gtt at a rate of 7 - Continue Keppra at 1000 mg IV BID.  - Will hold off on further phenytoin dosing - Continue Versed gtt for now. Goal is to start weaning off Versed after 24 hours.    Electronically signed: Dr. Caryl Pina 08/10/2021, 10:55 AM

## 2021-08-10 NOTE — Progress Notes (Signed)
Patient unavailable.  Will re-attempt when he returns from CT.

## 2021-08-10 NOTE — Procedures (Addendum)
Patient Name: Frederick [name]  MRN: [id number]  Epilepsy Attending: Charlsie Quest  Referring Physician/Provider: Dr Caryl Pina Date: 08/10/2021  Duration: 22 mins  Patient history: 59 y.o. male with a past medical hypertension and EtOH dependence BIB EMS after his wife complained of patient having a stroke.  EMS said that when they arrived he was actively seizing, tonic clonic in nature.  Given 5 mg Versed IM which stopped his seizure.  BG reportedly 70.  EEG to evaluate for status epilepticus.  Level of alertness:  lethargic  AEDs during EEG study: Versed, LEV  Technical aspects: This EEG study was done with scalp electrodes positioned according to the 10-20 International system of electrode placement. Electrical activity was acquired at a sampling rate of 500Hz  and reviewed with a high frequency filter of 70Hz  and a low frequency filter of 1Hz . EEG data were recorded continuously and digitally stored.   Description: EEG showed continuous generalized 2 to 3 Hz delta slowing with overriding 15 to 18 Hz beta activity. Hyperventilation and photic stimulation were not performed.     ABNORMALITY - Continuous slow, generalized  IMPRESSION: This study is suggestive of severe diffuse encephalopathy, nonspecific etiology but likely related to sedation. No seizures or epileptiform discharges were seen throughout the recording.  Dr. was notified.  Frederick [name] 

## 2021-08-10 NOTE — ED Provider Notes (Signed)
[facility] EMERGENCY DEPARTMENT Provider Note   CSN: [id number] Arrival date & time: 08/10/21  0931     History Chief Complaint  Patient presents with   Seizures    Frederick [name] is a 60 y.o. male with a past medical hypertension and EtOH dependence BIB EMS after his wife complained of patient having a stroke.  EMS said that when they arrived he was actively seizing, tonic clonic in nature.  Given 5 mg Versed IM which stopped his seizure.  BG reportedly 70.  O2 was originally 80% on room air however he went up to 93 on nasal cannula.  No known history of seizure or stroke.    Seizures     Past Medical History:  Diagnosis Date   Arthritis    Asthma    Bilateral lower extremity edema 05/28/2021   propping feet up   Cough 05/28/2021   with clear sputum   Dyspnea    Dyspnea 05/28/2021   with exertion   EtOH dependence (HCC)    drinks 2-3 of 40ounes a day   Fibula fracture    Dr Mardelle Matte 02-2011   Finger osteomyelitis, right ([place]) 05/26/2021   right index finger   Hernia    surgery 01-17-13   Hypertension    not taking bp meds since may 2022   Tuberculosis    couple of yrs ago took tx at [place] per pt on 05-28-2021    Patient Active Problem List   Diagnosis Date Noted   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   Osteomyelitis of index finger of right hand ([place]) 05/28/2021   Marijuana abuse 07/22/2017   Hyponatremia 12/03/2015   Left rib fracture 11/29/2015   Microcytic anemia 11/29/2015   Cigarette smoker 11/29/2015   Protein-calorie malnutrition, severe ([place]) 11/29/2015   Unintentional weight loss 11/29/2015   History of tuberculosis 11/28/2015   Cavitary lesion of lung 11/28/2015   SBO (small bowel obstruction) ([place]) 03/10/2013   Abdominal pain, diffuse 03/10/2013   Nausea and vomiting 03/10/2013   Fracture of fibula, proximal 01/21/2013   Closed fracture of left distal distal tibia  01/21/2013   EtOH dependence ([place])    Fibula fracture    Hernia     Past Surgical History:  Procedure Laterality Date   AMPUTATION Right 05/29/2021   Procedure: PARTIAL RIGHT 2ND FINGER AMPUTATION;  Surgeon: Mcarthur Rossetti, MD;  Location: [facility];  Service: Orthopedics;  Laterality: Right;   ANKLE SURGERY     INGUINAL HERNIA REPAIR Right 01/19/2013   Procedure: HERNIA REPAIR INGUINAL ADULT;  Surgeon: Gayland Curry, MD,FACS;  Location: [facility];  Service: General;  Laterality: Right;   TIBIA IM NAIL INSERTION Left 01/22/2013   Procedure: INTRAMEDULLARY (IM) NAIL TIBIAL;  Surgeon: Johnny Bridge, MD;  Location: [facility];  Service: Orthopedics;  Laterality: Left;       Family History  Problem Relation Age of Onset   Diabetes Mother    Diabetes Father    Hypertension Father     Social History   Tobacco Use   Smoking status: Every Day    Packs/day: 0.50    Types: Cigarettes   Smokeless tobacco: Never   Tobacco comments:    1 ppd  Vaping Use   Vaping Use: Never used  Substance Use Topics   Alcohol use: Yes    Comment: 1 beer this am per pt on 05-28-2021   Drug use: No  Home Medications Prior to Admission medications   Medication Sig Start Date End Date Taking? Authorizing Provider  albuterol (VENTOLIN HFA) 108 (90 Base) MCG/ACT inhaler Inhale 2 puffs into the lungs every 4 (four) hours as needed. 12/06/19   [provider]  chlordiazePOXIDE (LIBRIUM) 25 MG capsule 59m PO TID x 1D, then 25-549mPO BID X 1D, then 25-5037mO QD X 1D Patient not taking: No sig reported 04/17/20   GeiCarlisle CaterA-C  doxycycline (VIBRAMYCIN) 100 MG capsule Take 1 capsule (100 mg total) by mouth 2 (two) times daily. 05/20/21   JolRayna SextonA-C  fluticasone (FLONASE) 50 MCG/ACT nasal spray Place 1 spray into both nostrils daily. Patient taking differently: Place 1 spray into both nostrils daily as needed for allergies. 07/28/15   Hedges, JefDellis FilbertA-C   gentamicin cream (GARAMYCIN) 0.1 % Apply between right 4th and 5th toes once daily Patient not taking: No sig reported 06/06/20   GalMarzetta BoardPM  HYDROcodone-acetaminophen (NORCO/VICODIN) 5-325 MG tablet Take 1-2 tablets by mouth every 6 (six) hours as needed for moderate pain. 05/29/21 05/29/22  BlaMcarthur RossettiD  methocarbamol (ROBAXIN) 500 MG tablet Take 500 mg by mouth 2 (two) times daily as needed for muscle spasms.  04/03/20   [provider]  Multiple Vitamin (MULTIVITAMIN WITH MINERALS) TABS tablet Take 1 tablet by mouth daily. 05/31/19   Horton, CouBarbette HairD  Naftifine HCl 2 % GEL Apply one application daily between affected toes after bathing and drying well, let dry before going into socks/shoes 07/14/20   McDonald, AdaStephan MinisterPM  OXYGEN Inhale into the lungs. At hs not using not sure amount used    [provider]  SPIVa [facility]NDIHALER 18 MCG inhalation capsule  04/21/20   [provider]  SPIRIVA RESPIMAT 2.5 MCG/ACT AERS Inhale 2 sprays into the lungs every other day.  Patient not taking: No sig reported 11/24/15   [provider]  triamcinolone cream (KENALOG) 0.1 % Apply 1 application topically 2 (two) times daily. 05/06/20   MatFaustino CongressP  ferrous sulfate 325 (65 FE) MG tablet Take 1 tablet (325 mg total) by mouth 2 (two) times daily with a meal. Patient not taking: Reported on 02/12/2020 12/03/15 04/17/20  ShoJanece CanterburyD  ipratropium-albuterol (DUONEB) 0.5-2.5 (3) MG/3ML SOLN Take 3 mLs by nebulization every 6 (six) hours as needed. Patient not taking: Reported on 02/12/2020 12/02/15 04/17/20  AkuHosie PoissonD  zolpidem (AMBIEN) 5 MG tablet Take 1 tablet (5 mg total) by mouth at bedtime as needed for sleep. Patient not taking: Reported on 02/12/2020 12/02/15 04/17/20  AkuHosie PoissonD    Allergies    Patient has no known allergies.  Review of Systems   Review of Systems  Reason unable to perform ROS: LEVEL 5 CAVEAT AMS.    Physical Exam Updated Vital Signs There were no vitals taken for this visit.  Physical Exam Vitals and nursing note reviewed.  Constitutional:      Appearance: Normal appearance. He is not ill-appearing.     Comments: Patient appears unkempt with active seizure activity  HENT:     Head: Normocephalic and atraumatic.     Right Ear: Tympanic membrane normal.     Left Ear: Tympanic membrane normal.     Nose: Nose normal.     Mouth/Throat:     Mouth: Mucous membranes are dry.     Pharynx: Oropharynx is clear.  Eyes:     General: No scleral icterus.  Conjunctiva/sclera: Conjunctivae normal.     Pupils: Pupils are equal, round, and reactive to light.     Comments: Apparent left sided neglect  Neck:     Comments: Unable to assess Cardiovascular:     Rate and Rhythm: Regular rhythm. Tachycardia present.     Pulses: Normal pulses.  Pulmonary:     Effort: Pulmonary effort is normal. No respiratory distress.  Abdominal:     General: Abdomen is flat.     Palpations: Abdomen is soft.  Musculoskeletal:        General: No swelling or deformity.     Comments: Patient with purposeful movements of right upper extremity.  Left upper extremity rigidity and without purposeful movement  Skin:    General: Skin is warm and dry.     Findings: No bruising, lesion or rash.  Neurological:     Mental Status: He is disoriented.    ED Results / Procedures / Treatments   Labs (all labs ordered are listed, but only abnormal results are displayed) Labs Reviewed  COMPREHENSIVE METABOLIC PANEL - Abnormal; Notable for the following components:      Result Value   Potassium 2.5 (*)    Chloride 115 (*)    CO2 16 (*)    BUN <5 (*)    Creatinine, Ser 0.46 (*)    Calcium 5.2 (*)    Total Protein 3.9 (*)    Albumin 1.3 (*)    Alkaline Phosphatase 35 (*)    All other components within normal limits  CBC WITH DIFFERENTIAL/PLATELET - Abnormal; Notable for the following components:   RBC 2.39 (*)     Hemoglobin 7.5 (*)    HCT 23.9 (*)    Platelets 113 (*)    Lymphs Abs 0.6 (*)    All other components within normal limits  GLUCOSE, CAPILLARY - Abnormal; Notable for the following components:   Glucose-Capillary <10 (*)    All other components within normal limits  GLUCOSE, CAPILLARY - Abnormal; Notable for the following components:   Glucose-Capillary <10 (*)    All other components within normal limits  GLUCOSE, CAPILLARY - Abnormal; Notable for the following components:   Glucose-Capillary 44 (*)    All other components within normal limits  GLUCOSE, CAPILLARY - Abnormal; Notable for the following components:   Glucose-Capillary 159 (*)    All other components within normal limits  CBG MONITORING, ED - Abnormal; Notable for the following components:   Glucose-Capillary 127 (*)    All other components within normal limits  I-STAT ARTERIAL BLOOD GAS, ED - Abnormal; Notable for the following components:   pH, Arterial 7.496 (*)    pO2, Arterial 457 (*)    Bicarbonate 29.3 (*)    Acid-Base Excess 6.0 (*)    Sodium 133 (*)    Potassium 3.3 (*)    All other components within normal limits  RESP PANEL BY RT-PCR (FLU A&B, COVID) [id number]      EKG EKG Interpretation  Date/Time:  Monday August 10 2021 09:41:13 EDT Ventricular Rate:  107 PR Interval:  163 QRS Duration: 107 QT Interval:  362 QTC Calculation: 483 R Axis:   72 Text Interpretation: Sinus tachycardia Biatrial enlargement Incomplete left bundle branch block ST elevation, consider inferior injury Borderline prolonged QT interval Artifact in lead(s) I II III aVR aVL V1 V2 V3 V4 V5 V6 Confirmed by Davonna Belling [phone]) on 08/10/2021 10:02:46 AM  Radiology No results found.  Procedures .Critical Care  Date/Time: 08/10/2021 4:12 PM  Performed by: Rhae Hammock, PA-C Authorized by: Rhae Hammock, PA-C   Critical care provider statement:    Critical care time (minutes):  60   Critical care time was  exclusive of:  Separately billable procedures and treating other patients   Critical care was necessary to treat or prevent imminent or life-threatening deterioration of the following conditions:  CNS failure or compromise   Critical care was time spent personally by me on the following activities:  Development of treatment plan with patient or surrogate, discussions with consultants, discussions with primary provider, evaluation of patient's response to treatment, examination of patient, review of old charts, ventilator management, pulse oximetry, ordering and performing treatments and interventions and ordering and review of laboratory studies   I assumed direction of critical care for this patient from another provider in my specialty: no     Care discussed with: admitting provider   Comments:     Patient in status epilepticus.  Required medication management, intubation and multiple neuro neurology consulted.   Medications Ordered in ED Medications  levETIRAcetam (KEPPRA) IVPB 1000 mg/100 mL premix (0 mg Intravenous Stopped 08/10/21 1051)  midazolam (VERSED) 100 mg/100 mL (1 mg/mL) premix infusion (7 mg/hr Intravenous Rate/Dose Change 08/10/21 1344)  midazolam (VERSED) bolus via infusion 0-5 mg (5 mg Intravenous Bolus from Bag 08/10/21 1452)  docusate (COLACE) 50 MG/5ML liquid 100 mg (has no administration in time range)  polyethylene glycol (MIRALAX / GLYCOLAX) packet 17 g (has no administration in time range)  pantoprazole sodium (PROTONIX) 40 mg oral suspension 40 mg (has no administration in time range)  ondansetron (ZOFRAN) injection 4 mg (has no administration in time range)  fentaNYL (SUBLIMAZE) injection 50 mcg (50 mcg Intravenous Given 08/10/21 1524)  fentaNYL (SUBLIMAZE) injection 50-200 mcg (has no administration in time range)  ipratropium-albuterol (DUONEB) 0.5-2.5 (3) MG/3ML nebulizer solution 3 mL (has no administration in time range)  budesonide (PULMICORT) nebulizer solution 0.25 mg  (has no administration in time range)  insulin aspart (novoLOG) injection 0-9 Units (0 Units Subcutaneous Not Given 08/10/21 1530)  calcium gluconate 1 g/ 50 mL sodium chloride IVPB (1,000 mg Intravenous New Bag/Given 08/10/21 1541)  Chlorhexidine Gluconate Cloth 2 % PADS 6 each (6 each Topical Given 08/10/21 1549)  chlorhexidine gluconate (MEDLINE KIT) (PERIDEX) 0.12 % solution 15 mL (has no administration in time range)  MEDLINE mouth rinse (has no administration in time range)  levETIRAcetam (KEPPRA) IVPB 1000 mg/100 mL premix (0 mg Intravenous Stopped 08/10/21 1020)  levETIRAcetam (KEPPRA) IVPB 1000 mg/100 mL premix (0 mg Intravenous Stopped 08/10/21 1020)  LORazepam (ATIVAN) injection 1 mg (1 mg Intravenous Given 08/10/21 1008)  LORazepam (ATIVAN) injection 1 mg (1 mg Intravenous Given 08/10/21 1020)  LORazepam (ATIVAN) injection 2 mg (2 mg Intravenous Given 08/10/21 1055)  fosPHENYtoin (CEREBYX) 1,500 mg PE in sodium chloride 0.9 % 50 mL IVPB (0 mg PE/kg  75 kg Intravenous Stopped 08/10/21 1136)  etomidate (AMIDATE) injection (20 mg Intravenous Given 08/10/21 1113)  rocuronium (ZEMURON) injection (70 mg Intravenous Given 08/10/21 1113)  potassium chloride 10 mEq in 100 mL IVPB (0 mEq Intravenous Stopped 08/10/21 1342)  dextrose 50 % solution 25 g (0 g Intravenous Duplicate 12/13/54 3149)    ED Course  I have reviewed the triage vital signs and the nursing notes.  Patient case collaborated with MD Alvino Chapel.  Pertinent labs & imaging results that were available during my care of the patient were reviewed by me and considered in my medical decision making (see  chart for details).  Patient continues to seize after 4 mg of IV Ativan and 3 g of IV Keppra.  Patient intubated by MD Alvino Chapel.  Spoke with the patient's wife who said that her husband is wheelchair-bound however continues to be as active as he can.  He was normal when he first woke up however after a few minutes she noticed him leaning over the bed and  shaking.  She says that he was "blowing bubbles out of his mouth."  He was not able to speak to her at first however eventually he was able to tell her that he has been feeling this way all week.  He tells me that the patient drinks alcohol "all day."  Says that he usually sticks to 40s however over the past weeks he has also been drinking liquor.  She says that he had 2 57s yesterday which is much less than his usual intake.  Neurology, MD Cheral Marker was consulted for the patient's status epilepticus.  MDM Rules/Calculators/A&P 60 year old male with a past medical history of EtOH use disorder and osteomyelitis of the right index finger requiring amputation presented today in status epilepticus.  After speaking with the patient's wife I learned that the patient is a heavy alcohol drinker averaging around 8-12 drinks each day.  She reported that yesterday he drank less than usual, noting "2 40s."  Patient was given Versed by EMS and then 2 mg of Ativan in the emergency department.  Patient continued to seize and was given Keppra 3 g.  Patient continued to seize and was given 2 mg of Ativan and intubated by MD Pickering.  Patient was found to have a potassium of 2.5.  I replaced this.  I consulted pharmacy about the patient's hypocalcemia and they requested that I get a magnesium level before supplementing this.  Magnesium pending at this time.  I spoke with [facility] and they agreed to admit the patient to the ICU for status epilepticus.  See their note for further updates.  Imaging pending at this time. Final Clinical Impression(s) / ED Diagnoses Final diagnoses:  Status epilepticus [facility])    Rx / DC Orders Admit to ICU   Rhae Hammock, PA-C 08/10/21 1615    Davonna Belling, MD 08/19/21 [phone]

## 2021-08-10 NOTE — Progress Notes (Signed)
Pt transported on vent from 001 to CT and back without any complications. RN at bedside, RT will continue to monitor

## 2021-08-11 ENCOUNTER — Inpatient Hospital Stay (HOSPITAL_COMMUNITY): Payer: Medicare Other

## 2021-08-11 DIAGNOSIS — R569 Unspecified convulsions: Secondary | ICD-10-CM | POA: Diagnosis not present

## 2021-08-11 DIAGNOSIS — Z9911 Dependence on respirator [ventilator] status: Secondary | ICD-10-CM | POA: Diagnosis not present

## 2021-08-11 DIAGNOSIS — J9601 Acute respiratory failure with hypoxia: Secondary | ICD-10-CM | POA: Diagnosis not present

## 2021-08-11 LAB — CBC
HCT: 42.5 % (ref 39.0–52.0)
Hemoglobin: 14.2 g/dL (ref 13.0–17.0)
MCH: 32 pg (ref 26.0–34.0)
MCHC: 33.4 g/dL (ref 30.0–36.0)
MCV: 95.7 fL (ref 80.0–100.0)
Platelets: 161 10*3/uL (ref 150–400)
RBC: 4.44 MIL/uL (ref 4.22–5.81)
RDW: 15.4 % (ref 11.5–15.5)
WBC: 11.1 10*3/uL — ABNORMAL HIGH (ref 4.0–10.5)
nRBC: 0 % (ref 0.0–0.2)

## 2021-08-11 LAB — GLUCOSE, CAPILLARY
Glucose-Capillary: 135 mg/dL — ABNORMAL HIGH (ref 70–99)
Glucose-Capillary: 124 mg/dL — ABNORMAL HIGH (ref 70–99)
Glucose-Capillary: 126 mg/dL — ABNORMAL HIGH (ref 70–99)
Glucose-Capillary: 98 mg/dL (ref 70–99)
Glucose-Capillary: 95 mg/dL (ref 70–99)
Glucose-Capillary: 109 mg/dL — ABNORMAL HIGH (ref 70–99)

## 2021-08-11 LAB — BASIC METABOLIC PANEL
Anion gap: 10 (ref 5–15)
BUN: 5 mg/dL — ABNORMAL LOW (ref 8–23)
CO2: 27 mmol/L (ref 22–32)
Calcium: 8.8 mg/dL — ABNORMAL LOW (ref 8.9–10.3)
Chloride: 97 mmol/L — ABNORMAL LOW (ref 98–111)
Creatinine, Ser: 0.86 mg/dL (ref 0.61–1.24)
GFR, Estimated: >60 mL/min (ref >60)
Glucose, Bld: 138 mg/dL — ABNORMAL HIGH (ref 70–99)
Potassium: 4.1 mmol/L (ref 3.5–5.1)
Sodium: 134 mmol/L — ABNORMAL LOW (ref 135–145)

## 2021-08-11 LAB — LACTIC ACID, PLASMA
Lactic Acid, Venous: 7.5 mmol/L (ref 0.5–1.9)
Lactic Acid, Venous: 2.6 mmol/L (ref 0.5–1.9)

## 2021-08-11 LAB — GLUCOSE, RANDOM
Glucose, Bld: 164 mg/dL — ABNORMAL HIGH (ref 70–99)
Glucose, Bld: 98 mg/dL (ref 70–99)

## 2021-08-11 LAB — MAGNESIUM
Magnesium: 1.3 mg/dL — ABNORMAL LOW (ref 1.7–2.4)
Magnesium: 1.9 mg/dL (ref 1.7–2.4)

## 2021-08-11 LAB — PHOSPHORUS: Phosphorus: 2.3 mg/dL — ABNORMAL LOW (ref 2.5–4.6)

## 2021-08-11 MED ORDER — VITAL HIGH PROTEIN PO LIQD: 1000.0000 mL | ORAL | Status: DC

## 2021-08-11 MED ORDER — PROSOURCE TF PO LIQD: 45.0000 mL | Freq: Two times a day (BID) | ORAL | Status: DC

## 2021-08-11 MED ORDER — FOLIC ACID 1 MG PO TABS
1.0000 mg | ORAL_TABLET | Freq: Every day | ORAL | Status: DC
  Administered 2021-08-11 – 2021-08-23 (×10): 1 mg
  Filled 2021-08-11 (×11): qty 1

## 2021-08-11 MED ORDER — SODIUM CHLORIDE 0.9 % IV SOLN
3.0000 g | Freq: Three times a day (TID) | INTRAVENOUS | Status: DC
  Administered 2021-08-11 – 2021-08-12 (×4): 3 g via INTRAVENOUS
  Filled 2021-08-11 (×4): qty 8

## 2021-08-11 MED ORDER — SODIUM PHOSPHATES 45 MMOLE/15ML IV SOLN
15.0000 mmol | Freq: Once | INTRAVENOUS | Status: AC
  Administered 2021-08-11: 15 mmol via INTRAVENOUS
  Filled 2021-08-11: qty 5

## 2021-08-11 MED ORDER — PROSOURCE TF PO LIQD
45.0000 mL | Freq: Two times a day (BID) | ORAL | Status: DC
  Administered 2021-08-11: 45 mL
  Filled 2021-08-11 (×2): qty 45

## 2021-08-11 MED ORDER — ENOXAPARIN SODIUM 40 MG/0.4ML IJ SOSY
40.0000 mg | PREFILLED_SYRINGE | INTRAMUSCULAR | Status: DC
  Administered 2021-08-11 – 2021-08-17 (×7): 40 mg via SUBCUTANEOUS
  Filled 2021-08-11 (×7): qty 0.4

## 2021-08-11 MED ORDER — THIAMINE HCL 100 MG PO TABS
100.0000 mg | ORAL_TABLET | Freq: Every day | ORAL | Status: DC
  Administered 2021-08-11: 100 mg
  Filled 2021-08-11 (×2): qty 1

## 2021-08-11 MED ORDER — MAGNESIUM SULFATE 2 GM/50ML IV SOLN
2.0000 g | Freq: Once | INTRAVENOUS | Status: AC
  Administered 2021-08-11: 2 g via INTRAVENOUS
  Filled 2021-08-11: qty 50

## 2021-08-11 MED ORDER — VITAL AF 1.2 CAL PO LIQD
1000.0000 mL | ORAL | Status: DC
  Administered 2021-08-11: 1000 mL

## 2021-08-11 MED ORDER — ADULT MULTIVITAMIN LIQUID CH
15.0000 mL | Freq: Every day | ORAL | Status: DC
  Administered 2021-08-11: 15 mL
  Filled 2021-08-11 (×3): qty 15

## 2021-08-11 MED ORDER — MAGNESIUM SULFATE 4 GM/100ML IV SOLN
4.0000 g | Freq: Once | INTRAVENOUS | Status: AC
  Administered 2021-08-11: 4 g via INTRAVENOUS
  Filled 2021-08-11: qty 100

## 2021-08-11 MED ORDER — PROPOFOL 1000 MG/100ML IV EMUL
5.0000 ug/kg/min | INTRAVENOUS | Status: DC
  Administered 2021-08-11: 40 ug/kg/min via INTRAVENOUS
  Administered 2021-08-11: 50 ug/kg/min via INTRAVENOUS
  Filled 2021-08-11 (×3): qty 100

## 2021-08-11 NOTE — Progress Notes (Signed)
NAME:  Frederick [name], MRN:  [id number], DOB:  12-Aug-1961, LOS: 1 ADMISSION DATE:  08/10/2021, CONSULTATION DATE:  08/10/2022 REFERRING MD:  , CHIEF COMPLAINT:  Seizure with airway compromise  All information obtained from Medical records  History of Present Illness:  Frederick [name] is an 60 y.o. male  current every day smoker , chronic heavy beer drinker (significantly > 80 oz per day per wife) with more recent escalation of intake to include hard liquor, but with decreased intake yesterday relative to his baseline. (Drank "only" two 40 oz beers per wife) presenting from home with a 1 week history of recurrent focal seizure activity based on wife's description. She had called EMS for what she felt was a stroke. On EMS arrival, he was actively seizing with GTC seizure and was administered 5 mg IM Versed which stopped the seizure. CBG was 70 in the field per EMS,  without cessation of seizure activity. O2 was originally 80% on room air however he went up to 93 on nasal cannula. In the ED he received 2 mg IV Ativan and 3000 mg IV Keppra, also without cessation.   Pt decompensated and was intubated for airway protection in setting of seizures in the ED after second dose of Ativan.   In the ED Head imaging was negative for acute intracranial abnormalities. 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  Of note patient has a history of TB which was diagnosed in 2001. He was treated at that time by the [facility] x 6 months. Pt had recurrence in 2016 , and was treated with  isoniazid, rifampin, ethambutol, pyrazinamide and B6 by the [facility].  Labs reviewed : K 2.5, Chloride 115, CO2 16, Creatinine 0.47,Calcium 5.2, Albumin 1.3, Alk Phos 35 HGB 7.5, WBC 4.2, platelets of 113 T Max of 98.8  CXR 9/5>>   Extensive chronic lung disease changes with  peribronchial thickening, chronic nodularity and chronic interstitial disease.   Chronic scarring and volume loss in RIGHT upper lobe with mediastinal shift to RIGHT  Pertinent  Medical History   Past Medical History:  Diagnosis Date   Arthritis    Asthma    Bilateral lower extremity edema 05/28/2021   propping feet up   Cough 05/28/2021   with clear sputum   Dyspnea    Dyspnea 05/28/2021   with exertion   EtOH dependence (HCC)    drinks 2-3 of 40ounes a day   Fibula fracture    Dr Mardelle Matte 02-2011   Finger osteomyelitis, right ([place]) 05/26/2021   right index finger   Hernia    surgery 01-17-13   Hypertension    not taking bp meds since may 2022   Tuberculosis    couple of yrs ago took tx at [place] per pt on 05-28-2021     Significant Hospital Events: Including procedures, antibiotic start and stop dates in addition to other pertinent events   Admission 08/10/2022  Interim History / Subjective:  Started on fentanyl for grimacing. Still on versed.   Objective   Blood pressure 102/80, pulse (!) 118, temperature 99.5 F (37.5 C), temperature source Axillary, resp. rate 15, height _0  (1.854 m), weight 72.9 kg, SpO2 100 %.    Vent Mode: PRVC FiO2 (%):  [45 %-100 %] 45 % Set Rate:  [15 bmp] 15 bmp Vt Set:  [  630 mL] 630 mL PEEP:  [5 cmH20] 5 cmH20 Plateau Pressure:  [15 cmH20-18 cmH20] 15 cmH20   Intake/Output Summary (Last 24 hours) at 08/11/2021 0727 Last data filed at 08/11/2021 0700 Gross per 24 hour  Intake 1592.39 ml  Output 525 ml  Net 1067.39 ml   Filed Weights   08/10/21 1000 08/10/21 1627  Weight: 75 kg 72.9 kg    Examination: General: critically ill appearing man lying in bed in NAD, intubated and sedated HENT: [place]/AT, eyes anicteric. ETT in place.  Lungs: rhonchi bilaterally, thick clear/ white secretions from ETT. Synchronous with MV. Cardiovascular: S1S2, tachycardic, reg rhythm, sinus tach on tele. Abdomen:  soft, grimaces  with palpation Extremities: no c/c/e Neuro: RASS -4, grimaces during exam and resists eye opening. Pinpoint pupuls. Derm: warm, dry  CXR personally reviewed> chronic volume loss and RUL scarring persists. New RLL infiltrates suspicious for aspiration.  LA 3.3 > 7.5 PCT 5.2  Blood cultures> NGTD  Resolved Hospital Problem list   hypokalemia  Assessment & Plan:  Acute Respiratory Failure in setting of new onset seizures, suspected to be due to ETOH withdrawal. 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 Plan -LTVV, 4-8cc/kg IBW with goal Pplat<30 and DP<15 -PAD protocol for sedation -VAP prevention protocol -wean FiO2 to maintain SpO2 >90% -daily SAT & SBT as appropriate; will start once neuro confirms ok to wean versed infusion -Wean FiO2 and PEEP as able -resp culture, start empiric unasyn for likely aspiration pneumonia  Seizures in setting of possible ETOH withdrawal No acute intracranial abnormalities on CT -appreciate Neurology's assistance; con't keppra and fosphenytoin. -cEEG per Neuro -maintain seizure precautions   Lactic acidosis, uptrending. Possibly unrecognized seizures activity vs uncontrolled infection. No apparent hypotension overnight. -recheck LA this morning  Hypomagnesemia hypophosphatemia -repleted -monitor  DM with controlled hyperglycemia. Fingersticks not correlating with serum samples. -SSI PRN -goal BG 140-180 -fingersticks unreliable  Best Practice (right click and "Reselect all SmartList Selections" daily)   Diet/type: NPO DVT prophylaxis: SCD GI prophylaxis: PPI Lines: N/A Foley:  N/A Code Status:  full code Last date of multidisciplinary goals of care discussion [ Dr. Lamonte Sakai Attempted to call wife to update 08/10/2021. There was no answer  Labs   CBC: Recent Labs  Lab 08/10/21 1010 08/10/21 1244 08/11/21 0447  WBC 4.2  --  11.1*  NEUTROABS 3.0   --   --   HGB 7.5* 14.3 14.2  HCT 23.9* 42.0 42.5  MCV 100.0  --  95.7  PLT 113*  --  161     Basic Metabolic Panel: Recent Labs  Lab 08/10/21 1010 08/10/21 1244 08/10/21 1537 08/10/21 2055 08/11/21 0225  NA 136 133*  --   --  134*  K 2.5* 3.3*  --   --  4.1  CL 115*  --   --   --  97*  CO2 16*  --   --   --  27  GLUCOSE 78  --   --  225* 138*  BUN <5*  --   --   --  5*  CREATININE 0.46*  --   --   --  0.86  CALCIUM 5.2*  --   --   --  8.8*  MG  --   --  1.3*  --  1.3*  PHOS  --   --   --   --  2.3*    GFR: Estimated Creatinine Clearance: 93 mL/min (by C-G  formula based on SCr of 0.86 mg/dL). Recent Labs  Lab 08/10/21 1010 08/10/21 1537 08/11/21 0225 08/11/21 0447  PROCALCITON  --  5.20  --   --   WBC 4.2  --   --  11.1*  LATICACIDVEN  --  3.3* 7.5*  --      This patient is critically ill with multiple organ system failure which requires frequent high complexity decision making, assessment, support, evaluation, and titration of therapies. This was completed through the application of advanced monitoring technologies and extensive interpretation of multiple databases. During this encounter critical care time was devoted to patient care services described in this note for 40 minutes.  Frederick Hy, DO 08/11/21 7:58 AM [facility]

## 2021-08-11 NOTE — Progress Notes (Signed)
Subjective: No clinical seizures overnight.  ROS: Unable to obtain due to poor mental status  Examination  Vital signs in last 24 hours: Temp:  [98.6 F (37 C)-99.5 F (37.5 C)] 98.9 F (37.2 C) (09/06 0755) Pulse Rate:  [56-144] 111 (09/06 0800) Resp:  [13-26] 15 (09/06 0800) BP: (92-189)/(72-151) 107/79 (09/06 0800) SpO2:  [90 %-100 %] 100 % (09/06 0800) FiO2 (%):  [40 %-100 %] 40 % (09/06 0800) Weight:  [72.9 kg-75 kg] 72.9 kg (09/05 1627)  General: lying in bed, NAD CVS: pulse-normal rate and rhythm RS: Intubated, coarse breath sounds bilaterally Extremities: warm, no edema  Neuro: Off sedation, winces to noxious stimuli but does not open eyes, does not follow commands, PERRLA, corneal reflex intact, gag reflex intact, withdraws to noxious stimuli in all 4 extremities  Basic Metabolic Panel: Recent Labs  Lab 08/10/21 1010 08/10/21 1244 08/10/21 1537 08/10/21 2055 08/11/21 0225 08/11/21 0838  NA 136 133*  --   --  134*  --   K 2.5* 3.3*  --   --  4.1  --   CL 115*  --   --   --  97*  --   CO2 16*  --   --   --  27  --   GLUCOSE 78  --   --  225* 138* 164*  BUN <5*  --   --   --  5*  --   CREATININE 0.46*  --   --   --  0.86  --   CALCIUM 5.2*  --   --   --  8.8*  --   MG  --   --  1.3*  --  1.3*  --   PHOS  --   --   --   --  2.3*  --     CBC: Recent Labs  Lab 08/10/21 1010 08/10/21 1244 08/11/21 0447  WBC 4.2  --  11.1*  NEUTROABS 3.0  --   --   HGB 7.5* 14.3 14.2  HCT 23.9* 42.0 42.5  MCV 100.0  --  95.7  PLT 113*  --  161     Coagulation Studies: No results for input(s): LABPROT, INR in the last 72 hours.  Imaging CT head without contrast 08/10/2021: Atrophy with small vessel chronic ischemic changes of deep cerebral white matter. 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 No acute intracranial abnormalities.   ASSESSMENT AND PLAN: 60 year old male with history of alcohol use presented with generalized tonic-clonic status epilepticus.    Convulsive status epilepticus, resolved Alcohol use disorder Hyponatremia Hypomagnesemia Hypophosphatemia Lactic acidosis Leukocytosis -Status epilepticus in the setting of alcohol withdrawal -LTM EEG overnight did not show any seizures  Recommendation - We will stop Versed today - Continue Keppra 1000 mg twice daily -We will consider discontinuing LTM if patient remains seizure-free after about 24 hours of weaning Versed - Will consider obtaining MRI brain without contrast after discontinuing LTM EEG -Patient's alcohol level was less than 10 on arrival, recommend CIWA protocol and treat with benzodiazepine taper if needed -Continue thiamine 100 mg.  Can consider high-dose IV thiamine if needed after weaning sedation -Continue seizure precautions -As needed IV Versed for clinical seizure-like activity -Management of rest of comorbidities per primary team  CRITICAL CARE Performed by: Charlsie Quest   Total critical care time: 32 minutes  Critical care time was exclusive of separately billable procedures and treating other patients.  Critical care was necessary to treat or prevent imminent  or life-threatening deterioration.  Critical care was time spent personally by me on the following activities: development of treatment plan with patient and/or surrogate as well as nursing, discussions with consultants, evaluation of patient's response to treatment, examination of patient, obtaining history from patient or surrogate, ordering and performing treatments and interventions, ordering and review of laboratory studies, ordering and review of radiographic studies, pulse oximetry and re-evaluation of patient's condition.  Lindie Spruce Epilepsy [facility] For questions after 5pm please refer to AMION to reach the Neurologist on call

## 2021-08-11 NOTE — Progress Notes (Signed)
Tolerating SBT 5/5 with RR 22, Vt in 400s, but RASS remains -4. Will order propofol PRN for overnight. Hopeful for AM extubation.  Steffanie Dunn, DO 08/11/21 5:12 PM [facility]

## 2021-08-11 NOTE — Progress Notes (Signed)
[facility] ADULT ICU REPLACEMENT PROTOCOL   The patient does apply for the [facility] Adult ICU Electrolyte Replacment Protocol based on the criteria listed below:   1.Exclusion criteria: TCTS patients, ECMO patients and Hypothermia Protocol, and   Dialysis patients 2. Is GFR >/= 30 ml/min? Yes.    Patient's GFR today is >60 3. Is SCr </= 2? Yes.   Patient's SCr is 0.86 mg/dL 4. Did SCr increase >/= 0.5 in 24 hours? No. 5.Pt's weight >40kg  Yes.   6. Abnormal electrolyte(s):  Mg 1.3, Phos 2.3  7. Electrolytes replaced per protocol 8.  Call MD STAT for K+ </= 2.5, Phos </= 1, or Mag </= 1 Physician:  M. Dinkels  Frederick [name] 08/11/2021 5:39 AM

## 2021-08-11 NOTE — Progress Notes (Signed)
Pharmacy Antibiotic Note  Frederick [name] is a 60 y.o. male admitted on 08/10/2021 with seizure with airway compromise, now concerned with aspiration pneumonia.  Pharmacy has been consulted for ampicillin/sulbactam dosing.  Though afebrile, WBC WNL, patient has an elevated lactic acid today (7.5). Renal function stable.   Plan: Ampicillin/sulbactam  3g IV Q8H Monitor s/x of infection, renal function, and cultures  Height: 6\' 1"  (185.4 cm) Weight: 72.9 kg (160 lb 11.5 oz) IBW/kg (Calculated) : 79.9  Temp (24hrs), Avg:98.9 F (37.2 C), Min:98.6 F (37 C), Max:99.5 F (37.5 C)  Recent Labs  Lab 08/10/21 1010 08/10/21 1537 08/11/21 0225 08/11/21 0447  WBC 4.2  --   --  11.1*  CREATININE 0.46*  --  0.86  --   LATICACIDVEN  --  3.3* 7.5*  --     Estimated Creatinine Clearance: 93 mL/min (by C-G formula based on SCr of 0.86 mg/dL).    No Known Allergies  Unasyn 9/6 >>  Microbiology results: 9/5 BCx: NG 24 hrs  9/5 MRSA PCR: Negative  9/6 Resp Cx: pending  Thank you for allowing pharmacy to be a part of this patient's care.  11/6 Frederick [name] 08/11/2021 9:05 AM

## 2021-08-11 NOTE — Progress Notes (Addendum)
Initial Nutrition Assessment  DOCUMENTATION CODES:   Non-severe (moderate) malnutrition in context of chronic illness  INTERVENTION:   Initiate Vital AF 1.2 @ 65 ml/hr via OGT (1560 ml/day)   Tube feeding regimen provides 1872 kcal (100% of needs), 117 grams of protein, and 1265 ml of H2O.    NUTRITION DIAGNOSIS:   Moderate Malnutrition related to chronic illness (ETOH abuse) as evidenced by mild fat depletion, mild muscle depletion, percent weight loss.  GOAL:   Patient will meet greater than or equal to 90% of their needs  MONITOR:   Vent status, Labs, Weight trends, TF tolerance, Skin, I & O's  REASON FOR ASSESSMENT:   Consult, Ventilator Enteral/tube feeding initiation and management  ASSESSMENT:   60 year old daily smoker, heavy alcohol use.  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  Pt admitted with acute respiratory failure in the setting of new onset seizures and suspect ETOH withdrawal.   Patient is currently intubated on ventilator support MV: 9.6 L/min Temp (24hrs), Avg:98.7 F (37.1 C), Min:97.6 F (36.4 C), Max:99.5 F (37.5 C)  MAP: 105  Reviewed I/O's: +1.1 L x 24 hours   UOP: 525 ml x 24 hours  Pt with OGT, currently connected to low, intermittent suction. He remains on continuous EEG monitoring.   Reviewed wt hx; pt has experienced a 10.7% wt loss over the past 3 months, which is significant for time frame.   Medications reviewed and include folic acid, thiamine, dextrose 5% in lactated ringers infusion @ 30 ml/hr, versed, and keppra.  Labs reviewed: CBGS: 124 (inpatient orders for glycemic control are none).    NUTRITION - FOCUSED PHYSICAL EXAM:  Flowsheet Row Most Recent Value  Orbital Region Mild depletion  Upper Arm Region Mild depletion  Thoracic and Lumbar Region Mild depletion  Buccal Region Mild  depletion  Temple Region Mild depletion  Clavicle Bone Region Moderate depletion  Clavicle and Acromion Bone Region Mild depletion  Scapular Bone Region Mild depletion  Dorsal Hand No depletion  Patellar Region Mild depletion  Anterior Thigh Region Mild depletion  Posterior Calf Region Mild depletion  Edema (RD Assessment) None  Hair Reviewed  Eyes Reviewed  Mouth Reviewed  Skin Reviewed  Nails Reviewed       Diet Order:   Diet Order             Diet NPO time specified  Diet effective now                   EDUCATION NEEDS:      Skin:  Skin Assessment: Reviewed RN Assessment  Last BM:  Unknown  Height:   Ht Readings from Last 1 Encounters:  08/10/21 6\' 1"  (1.854 m)    Weight:   Wt Readings from Last 1 Encounters:  08/10/21 72.9 kg    Ideal Body Weight:  83.6 kg  BMI:  Body mass index is 21.2 kg/m.  Estimated Nutritional Needs:   Kcal:  1876  Protein:  110-125 grams  Fluid:  > 1.8 L    10/10/21, RD, LDN, CDCES Registered Dietitian II Certified Diabetes Care and Education Specialist Please refer to [facility] for RD and/or RD on-call/weekend/after hours pager

## 2021-08-11 NOTE — Procedures (Addendum)
Patient Name: ALDRIC WENZLER  MRN: [id number]  Epilepsy Attending: Charlsie Quest  Referring Physician/Provider: Dr Caryl Pina Duration: 08/10/2021 1330 to 08/11/2021 1330   Patient history: 60 y.o. male with a past medical hypertension and EtOH dependence BIB EMS after his wife complained of patient having a stroke.  EMS said that when they arrived he was actively seizing, tonic clonic in nature.  Given 5 mg Versed IM which stopped his seizure.  BG reportedly 70.  EEG to evaluate for status epilepticus.   Level of alertness:  lethargic   AEDs during EEG study: Versed, LEV   Technical aspects: This EEG study was done with scalp electrodes positioned according to the 10-20 International system of electrode placement. Electrical activity was acquired at a sampling rate of 500Hz  and reviewed with a high frequency filter of 70Hz  and a low frequency filter of 1Hz . EEG data were recorded continuously and digitally stored.    Description: EEG showed continuous generalized 2 to 3 Hz delta slowing with overriding 15 to 18 Hz beta activity. Hyperventilation and photic stimulation were not performed.      ABNORMALITY - Continuous slow, generalized   IMPRESSION: This study is suggestive of severe diffuse encephalopathy, nonspecific etiology but likely related to sedation. No seizures or epileptiform discharges were seen throughout the recording.   Sumner Boesch 

## 2021-08-12 DIAGNOSIS — B951 Streptococcus, group B, as the cause of diseases classified elsewhere: Secondary | ICD-10-CM

## 2021-08-12 DIAGNOSIS — R7881 Bacteremia: Secondary | ICD-10-CM | POA: Diagnosis present

## 2021-08-12 DIAGNOSIS — J9601 Acute respiratory failure with hypoxia: Secondary | ICD-10-CM | POA: Diagnosis not present

## 2021-08-12 DIAGNOSIS — I5021 Acute systolic (congestive) heart failure: Secondary | ICD-10-CM | POA: Diagnosis not present

## 2021-08-12 DIAGNOSIS — Z9911 Dependence on respirator [ventilator] status: Secondary | ICD-10-CM | POA: Diagnosis not present

## 2021-08-12 DIAGNOSIS — B955 Unspecified streptococcus as the cause of diseases classified elsewhere: Secondary | ICD-10-CM | POA: Diagnosis not present

## 2021-08-12 DIAGNOSIS — G40901 Epilepsy, unspecified, not intractable, with status epilepticus: Secondary | ICD-10-CM | POA: Diagnosis not present

## 2021-08-12 DIAGNOSIS — R569 Unspecified convulsions: Secondary | ICD-10-CM | POA: Diagnosis not present

## 2021-08-12 DIAGNOSIS — E440 Moderate protein-calorie malnutrition: Secondary | ICD-10-CM | POA: Diagnosis present

## 2021-08-12 LAB — GLUCOSE, CAPILLARY
Glucose-Capillary: 97 mg/dL (ref 70–99)
Glucose-Capillary: 142 mg/dL — ABNORMAL HIGH (ref 70–99)
Glucose-Capillary: 21 mg/dL — CL (ref 70–99)
Glucose-Capillary: 110 mg/dL — ABNORMAL HIGH (ref 70–99)
Glucose-Capillary: 60 mg/dL — ABNORMAL LOW (ref 70–99)
Glucose-Capillary: 122 mg/dL — ABNORMAL HIGH (ref 70–99)
Glucose-Capillary: 98 mg/dL (ref 70–99)
Glucose-Capillary: 95 mg/dL (ref 70–99)

## 2021-08-12 LAB — BLOOD CULTURE ID PANEL (REFLEXED) - BCID2

## 2021-08-12 LAB — BASIC METABOLIC PANEL
Anion gap: 9 (ref 5–15)
BUN: 10 mg/dL (ref 8–23)
CO2: 25 mmol/L (ref 22–32)
Calcium: 8.4 mg/dL — ABNORMAL LOW (ref 8.9–10.3)
Chloride: 101 mmol/L (ref 98–111)
Creatinine, Ser: 0.93 mg/dL (ref 0.61–1.24)
GFR, Estimated: >60 mL/min (ref >60)
Glucose, Bld: 117 mg/dL — ABNORMAL HIGH (ref 70–99)
Potassium: 3.2 mmol/L — ABNORMAL LOW (ref 3.5–5.1)
Sodium: 135 mmol/L (ref 135–145)

## 2021-08-12 LAB — CALCIUM, IONIZED: Calcium, Ionized, Serum: 4.8 mg/dL (ref 4.5–5.6)

## 2021-08-12 LAB — TRIGLYCERIDES: Triglycerides: 72 mg/dL (ref <150)

## 2021-08-12 LAB — MAGNESIUM: Magnesium: 1.8 mg/dL (ref 1.7–2.4)

## 2021-08-12 LAB — PHOSPHORUS: Phosphorus: 3.9 mg/dL (ref 2.5–4.6)

## 2021-08-12 MED ORDER — IPRATROPIUM-ALBUTEROL 0.5-2.5 (3) MG/3ML IN SOLN: 3.0000 mL | Freq: Four times a day (QID) | RESPIRATORY_TRACT | Status: DC | PRN

## 2021-08-12 MED ORDER — LORAZEPAM 2 MG/ML IJ SOLN
1.0000 mg | INTRAMUSCULAR | Status: AC | PRN
  Administered 2021-08-12: 1 mg via INTRAVENOUS
  Administered 2021-08-12 (×2): 3 mg via INTRAVENOUS
  Administered 2021-08-12: 2 mg via INTRAVENOUS
  Administered 2021-08-13 (×2): 3 mg via INTRAVENOUS
  Filled 2021-08-12 (×2): qty 2
  Filled 2021-08-12: qty 1
  Filled 2021-08-12: qty 2
  Filled 2021-08-12 (×2): qty 1
  Filled 2021-08-12: qty 2

## 2021-08-12 MED ORDER — POTASSIUM CHLORIDE 10 MEQ/100ML IV SOLN
10.0000 meq | INTRAVENOUS | Status: AC
  Administered 2021-08-12 (×6): 10 meq via INTRAVENOUS
  Filled 2021-08-12: qty 100

## 2021-08-12 MED ORDER — CARVEDILOL 3.125 MG PO TABS
3.1250 mg | ORAL_TABLET | Freq: Two times a day (BID) | ORAL | Status: DC
  Administered 2021-08-14 – 2021-08-15 (×2): 3.125 mg
  Filled 2021-08-12 (×2): qty 1

## 2021-08-12 MED ORDER — LORAZEPAM 2 MG/ML IJ SOLN: 2.0000 mg | Freq: Once | INTRAMUSCULAR | Status: DC

## 2021-08-12 MED ORDER — MAGNESIUM SULFATE 2 GM/50ML IV SOLN
2.0000 g | Freq: Once | INTRAVENOUS | Status: AC
  Administered 2021-08-12: 2 g via INTRAVENOUS
  Filled 2021-08-12: qty 50

## 2021-08-12 MED ORDER — LORAZEPAM 1 MG PO TABS
1.0000 mg | ORAL_TABLET | ORAL | Status: AC | PRN
  Administered 2021-08-14: 4 mg
  Administered 2021-08-14: 2 mg
  Filled 2021-08-12: qty 4

## 2021-08-12 MED ORDER — LABETALOL HCL 5 MG/ML IV SOLN
10.0000 mg | INTRAVENOUS | Status: DC | PRN
  Administered 2021-08-14: 10 mg via INTRAVENOUS
  Filled 2021-08-12: qty 4

## 2021-08-12 MED ORDER — SODIUM CHLORIDE 0.9 % IV SOLN
3.0000 g | Freq: Four times a day (QID) | INTRAVENOUS | Status: DC
  Administered 2021-08-12 – 2021-08-13 (×3): 3 g via INTRAVENOUS
  Filled 2021-08-12 (×3): qty 8

## 2021-08-12 NOTE — Procedures (Addendum)
Patient Name: Frederick [name]  MRN: [id number]  Epilepsy Attending: Charlsie Quest  Referring Physician/Provider: Dr Caryl Pina Duration: 08/11/2021 1330 to 08/12/2021 1043   Patient history: 60 y.o. male with a past medical hypertension and EtOH dependence BIB EMS after his wife complained of patient having a stroke.  EMS said that when they arrived he was actively seizing, tonic clonic in nature.  Given 5 mg Versed IM which stopped his seizure.  BG reportedly 70.  EEG to evaluate for status epilepticus.   Level of alertness:  awake, asleep   AEDs during EEG study: LEV   Technical aspects: This EEG study was done with scalp electrodes positioned according to the 10-20 International system of electrode placement. Electrical activity was acquired at a sampling rate of 500Hz  and reviewed with a high frequency filter of 70Hz  and a low frequency filter of 1Hz . EEG data were recorded continuously and digitally stored.    Description: No clear posterior dominant rhythm was seen. Sleep was characterized by sleep spindles (12 to 14 Hz), maximum frontocentral region. EEG showed continuous generalized 3-6Hz  theta- delta slowing with overriding 15 to 18 Hz beta activity. Hyperventilation and photic stimulation were not performed.      ABNORMALITY - Continuous slow, generalized   IMPRESSION: This study is suggestive of moderate diffuse encephalopathy, nonspecific etiology but likely related to sedation. No seizures or epileptiform discharges were seen throughout the recording.   Frederick [name] 

## 2021-08-12 NOTE — Progress Notes (Signed)
Pharmacy Electrolyte Replacement  Recent Labs:  Recent Labs    08/12/21 0453  K 3.2*  MG 1.8  PHOS 3.9  CREATININE 0.93    Low Critical Values (K </= 2.5, Phos </= 1, Mg </= 1) Present: None   Plan:  - K 3.2 - K-runs x 6 already ordered - Mg 1.8 - will supplement Mg 2g IV x 1 - Recheck K/Mg with AM labs  Thank you for allowing pharmacy to be a part of this patient's care.  Georgina Pillion, PharmD, BCPS Clinical Pharmacist Clinical phone for 08/12/2021: [phone] 08/12/2021 7:00 AM   **Pharmacist phone directory can now be found on [email or web address] ([email or web address]).  Listed under [facility].

## 2021-08-12 NOTE — Progress Notes (Signed)
Disconnected patient from LTM monitoring.

## 2021-08-12 NOTE — Progress Notes (Signed)
Pharmacy Antibiotic Note  Frederick [name] is a 59 y.o. male admitted on 08/10/2021 with seizure with airway compromise, now concerned with aspiration pneumonia.  Pharmacy has been consulted for ampicillin/sulbactam dosing.    Plan: Adjust Unasyn to 3g IV every 6 hours Monitor s/x of infection, renal function, and cultures  Height: 6\' 1"  (185.4 cm) Weight: 72.9 kg (160 lb 11.5 oz) IBW/kg (Calculated) : 79.9  Temp (24hrs), Avg:98.6 F (37 C), Min:97.6 F (36.4 C), Max:99.4 F (37.4 C)  Recent Labs  Lab 08/10/21 1010 08/10/21 1537 08/11/21 0225 08/11/21 0447 08/11/21 0820 08/12/21 0453  WBC 4.2  --   --  11.1*  --   --   CREATININE 0.46*  --  0.86  --   --  0.93  LATICACIDVEN  --  3.3* 7.5*  --  2.6*  --      Estimated Creatinine Clearance: 86 mL/min (by C-G formula based on SCr of 0.93 mg/dL).    No Known Allergies  Unasyn 9/6 >>  Microbiology results: 9/5 BCx: 2/3 Strep species  9/5 MRSA PCR: Negative  9/6 Resp Cx: pending  Thank you for allowing pharmacy to be a part of this patient's care.  11/6, PharmD, BCPS Clinical Pharmacist Clinical phone for 08/12/2021: [phone] 08/12/2021 1:25 PM   **Pharmacist phone directory can now be found on [email or web address] ([email or web address]).  Listed under [facility].

## 2021-08-12 NOTE — Progress Notes (Signed)
PHARMACY - PHYSICIAN COMMUNICATION CRITICAL VALUE ALERT - BLOOD CULTURE IDENTIFICATION (BCID)  Frederick [name] is an 60 y.o. male who presented to [facility] on 08/10/2021 with a chief complaint of seizure.  Assessment:  Admitted after seizure with airway compromise, started on Unasyn for suspected aspiration PNA, now growing strep spp in 2 of 3 blood cx bottles.  Name of physician (or Provider) Contacted: OOgan MD  Current antibiotics: Unasyn  Changes to prescribed antibiotics recommended:  Continue Unasyn for now.  Results for orders placed or performed during the hospital encounter of 08/10/21  Blood Culture ID Panel (Reflexed) (Collected: 08/10/2021  2:00 PM)  Result Value Ref Range   Enterococcus faecalis NOT DETECTED NOT DETECTED   Enterococcus Faecium NOT DETECTED NOT DETECTED   Listeria monocytogenes NOT DETECTED NOT DETECTED   Staphylococcus species NOT DETECTED NOT DETECTED   Staphylococcus aureus (BCID) NOT DETECTED NOT DETECTED   Staphylococcus epidermidis NOT DETECTED NOT DETECTED   Staphylococcus lugdunensis NOT DETECTED NOT DETECTED   Streptococcus species DETECTED (A) NOT DETECTED   Streptococcus agalactiae NOT DETECTED NOT DETECTED   Streptococcus pneumoniae NOT DETECTED NOT DETECTED   Streptococcus pyogenes NOT DETECTED NOT DETECTED   A.calcoaceticus-baumannii NOT DETECTED NOT DETECTED   Bacteroides fragilis NOT DETECTED NOT DETECTED   Enterobacterales NOT DETECTED NOT DETECTED   Enterobacter cloacae complex NOT DETECTED NOT DETECTED   Escherichia coli NOT DETECTED NOT DETECTED   Klebsiella aerogenes NOT DETECTED NOT DETECTED   Klebsiella oxytoca NOT DETECTED NOT DETECTED   Klebsiella pneumoniae NOT DETECTED NOT DETECTED   Proteus species NOT DETECTED NOT DETECTED   Salmonella species NOT DETECTED NOT DETECTED   Serratia marcescens NOT DETECTED NOT DETECTED   Haemophilus influenzae NOT DETECTED NOT DETECTED   Neisseria meningitidis NOT DETECTED NOT DETECTED    Pseudomonas aeruginosa NOT DETECTED NOT DETECTED   Stenotrophomonas maltophilia NOT DETECTED NOT DETECTED   Candida albicans NOT DETECTED NOT DETECTED   Candida auris NOT DETECTED NOT DETECTED   Candida glabrata NOT DETECTED NOT DETECTED   Candida krusei NOT DETECTED NOT DETECTED   Candida parapsilosis NOT DETECTED NOT DETECTED   Candida tropicalis NOT DETECTED NOT DETECTED   Cryptococcus neoformans/gattii NOT DETECTED NOT DETECTED    Vernard Gambles, PharmD, BCPS  08/12/2021  1:29 AM

## 2021-08-12 NOTE — Consult Note (Signed)
Cardiology Consultation:   Patient ID: ACXEL DINGEE MRN: [id number]; DOB: [birth year]  Admit date: 08/10/2021 Date of Consult: 08/12/2021  PCP:  Pa, [place] Providers Cardiologist:  None    Patient Profile:   Frederick [name] is a 60 y.o. male with a history COPD/asthma, tuberculosis in 2001 (treated by [facility]) with recurrence in 2016 (treated by [facility]), hypertension, and alcohol abuse who is being seen for the evaluation of CHF at the request of Dr. Carlis Abbott.  History of Present Illness:   Frederick [name] is a 60 year old male with the above history. No prior cardiac history prior to this admission.  He presented to the ED on 08/11/2019 292 via EMS for seizure.   Upon EMS arrival, patient was noted to have tonic-clonic seizing.  He was given IV Versed and seizure stopped.  CBG also 70 in the field.  Upon arrival to the ED, patient was still in postictal state.  Patient decompensated in the ED and was intubated for airway protection after second dose of Ativan. 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  Patient has a significant vacant history of alcohol abuse.  Per wife, patient drinks greater than 80 ounces of beer per day and more recently has started adding liquor to this as well.  Wife reported that he decreased his intake recently prior to admission so seizures felt to likely be due to alcohol withdrawal. Neurology was consulted and patient was started on Keppra.  Echo showed LVEF of 25 to 30% with akinesis of mid LV wall segments while the apex and basal segments contract.  Possibly a mid wall Takotsubo variant.  RV normal in size with mildly reduced systolic function.  Allergy consulted for further evaluation.  At the time of this evaluation, patient resting in no  acute distress. He is tachycardic with rates in the 120s.He does have a wet sounding cough though. He was extubated earlier today and is still very hoarse. Voice is soft and is difficult to hear the patient. However, he denies any chest pain, shortness of breath, orthopnea, PND, palpitations, lightheadedness, dizziness. He denies syncope outside of seizures. No family at bedside so difficult to gather any additional history.  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/26/2021   right index finger   Hernia    surgery 01-17-13   Hypertension    not taking bp meds since may 2022   Tuberculosis    couple of yrs ago took tx at [place] per pt on 05-28-2021    Past Surgical History:  Procedure Laterality Date   AMPUTATION Right 05/29/2021   Procedure: PARTIAL RIGHT 2ND FINGER AMPUTATION;  Surgeon: Mcarthur Rossetti, MD;  Location: [facility];  Service: Orthopedics;  Laterality: Right;   ANKLE SURGERY     INGUINAL HERNIA REPAIR Right 01/19/2013   Procedure: HERNIA REPAIR INGUINAL ADULT;  Surgeon: Gayland Curry,  MD,FACS;  Location: [place];  Service: General;  Laterality: Right;   TIBIA IM NAIL INSERTION Left 01/22/2013   Procedure: INTRAMEDULLARY (IM) NAIL TIBIAL;  Surgeon: Johnny Bridge, MD;  Location: [place];  Service: Orthopedics;  Laterality: Left;     Home Medications:  Prior to Admission medications   Medication Sig Start Date End Date Taking? Authorizing Provider  albuterol (VENTOLIN HFA) 108 (90 Base) MCG/ACT inhaler Inhale 2 puffs into the lungs every 4 (four) hours as needed. 12/06/19  Yes [provider]  fluticasone (FLONASE)  50 MCG/ACT nasal spray Place 1 spray into both nostrils daily. Patient taking differently: Place 1 spray into both nostrils daily as needed for allergies. 07/28/15  Yes Hedges, Dellis Filbert, PA-C  chlordiazePOXIDE (LIBRIUM) 25 MG capsule 24m PO TID x 1D, then 25-515mPO BID X 1D, then 25-5015mO QD X 1D Patient not taking: No sig reported 04/17/20   GeiCarlisle CaterA-C  doxycycline (VIBRAMYCIN) 100 MG capsule Take 1 capsule (100 mg total) by mouth 2 (two) times daily. Patient not taking: Reported on 08/10/2021 05/20/21   JolRayna SextonA-C  gentamicin cream (GARAMYCIN) 0.1 % Apply between right 4th and 5th toes once daily Patient not taking: No sig reported 06/06/20   GalMarzetta BoardPM  HYDROcodone-acetaminophen (NORCO/VICODIN) 5-325 MG tablet Take 1-2 tablets by mouth every 6 (six) hours as needed for moderate pain. Patient not taking: No sig reported 05/29/21 05/29/22  BlaMcarthur RossettiD  Multiple Vitamin (MULTIVITAMIN WITH MINERALS) TABS tablet Take 1 tablet by mouth daily. Patient not taking: No sig reported 05/31/19   Horton, CouBarbette HairD  Naftifine HCl 2 % GEL Apply one application daily between affected toes after bathing and drying well, let dry before going into socks/shoes Patient not taking: Reported on 08/10/2021 07/14/20   McDCriselda PeachesPM  OXYGEN Inhale into the lungs. At hs not using not sure amount used    [provider]  triamcinolone cream (KENALOG) 0.1 % Apply 1 application topically 2 (two) times daily. Patient not taking: No sig reported 05/06/20   MatFaustino CongressP  ferrous sulfate 325 (65 FE) MG tablet Take 1 tablet (325 mg total) by mouth 2 (two) times daily with a meal. Patient not taking: Reported on 02/12/2020 12/03/15 04/17/20  ShoJanece CanterburyD  ipratropium-albuterol (DUONEB) 0.5-2.5 (3) MG/3ML SOLN Take 3 mLs by nebulization every 6 (six) hours as needed. Patient not taking: Reported on 02/12/2020 12/02/15 04/17/20  AkuHosie PoissonD  zolpidem  (AMBIEN) 5 MG tablet Take 1 tablet (5 mg total) by mouth at bedtime as needed for sleep. Patient not taking: Reported on 02/12/2020 12/02/15 04/17/20  AkuHosie PoissonD    Inpatient Medications: Scheduled Meds:  carvedilol  3.125 mg Per Tube BID WC   chlorhexidine gluconate (MEDLINE KIT)  15 mL Mouth Rinse BID   Chlorhexidine Gluconate Cloth  6 each Topical Daily   enoxaparin (LOVENOX) injection  40 mg Subcutaneous Q24H   feeding supplement (PROSource TF)  45 mL Per Tube BID   folic acid  1 mg Per Tube Daily   insulin aspart  0-9 Units Subcutaneous Q4H   multivitamin  15 mL Per Tube Daily   pantoprazole sodium  40 mg Per Tube Daily   thiamine  100 mg Per Tube Daily   Continuous Infusions:  ampicillin-sulbactam (UNASYN) IV     feeding supplement (VITAL AF 1.2 CAL) 65 mL/hr at 08/12/21 0600   levETIRAcetam Stopped (08/12/21 1024)   PRN Meds:  docusate, ipratropium-albuterol, labetalol, LORazepam **OR** LORazepam, ondansetron (ZOFRAN) IV, polyethylene glycol  Allergies:   No Known Allergies  Social History:   Social History   Socioeconomic History   Marital status: Married    Spouse name: Not on file   Number of children: Not on file   Years of education: Not on file   Highest education level: Not on file  Occupational History   Not on file  Tobacco Use   Smoking status: Every Day    Packs/day: 0.50    Types: Cigarettes   Smokeless tobacco: Never   Tobacco comments:    1 ppd  Vaping Use   Vaping Use: Never used  Substance and Sexual Activity   Alcohol use: Yes    Comment: 1 beer this am per pt on 05-28-2021   Drug use: No   Sexual activity: Never  Other Topics Concern   Not on file  Social History Narrative   Not on file   Social Determinants of Health   Financial Resource Strain: Not on file  Food Insecurity: Not on file  Transportation Needs: Not on file  Physical Activity: Not on file  Stress: Not on file  Social Connections: Not on file  Intimate Partner  Violence: Not on file    Family History:   Family History  Problem Relation Age of Onset   Diabetes Mother    Diabetes Father    Hypertension Father      ROS:  Please see the history of present illness.  Review of Systems  Unable to perform ROS: Other (patient extubated earlier today. Patient is very hoarse and voice is very soft. Difficult to hear patient)    Physical Exam/Data:   Vitals:   08/12/21 0944 08/12/21 1000 08/12/21 1100 08/12/21 1200  BP: (!) 156/88 (!) 154/92 (!) 143/89 (!) 153/97  Pulse: (!) 122 (!) 124 (!) 125 (!) 126  Resp:  (!) 32 (!) 26 (!) 24  Temp:    98.8 F (37.1 C)  TempSrc:    Axillary  SpO2:  100% 100% 100%  Weight:      Height:        Intake/Output Summary (Last 24 hours) at 08/12/2021 1434 Last data filed at 08/12/2021 1200 Gross per 24 hour  Intake 2426.63 ml  Output 1010 ml  Net 1416.63 ml   Last 3 Weights 08/10/2021 08/10/2021 05/29/2021  Weight (lbs) 160 lb 11.5 oz 165 lb 5.5 oz 162 lb 8 oz  Weight (kg) 72.9 kg 75 kg 73.71 kg     Body mass index is 21.2 kg/m.  General: 60 y.o. African-American  male resting comfortably in no acute distress. HEENT: Normocephalic and atraumatic. Sclera clear.  Neck: Supple. No JVD. Heart: Tachycardic with regular rhythm. Distinct S1 and S2. No murmurs, gallops, or rubs. Radial and distal pedal pulses 2+ and equal bilaterally. Lungs: No increased work of breathing. Clear to ausculation bilaterally. No wheezes, rhonchi, or rales.  Abdomen: Soft, non-distended, and non-tender to palpation. Bowel sounds present. Extremities: No lower extremity edema.    Skin: Warm and dry. Neuro: Alert and oriented x3. No focal deficits. Psych: Currently in mittens given CIWA score of 11 earlier. Responds appropriately.  EKG:  The EKG was personally reviewed and demonstrates:  Sinus tachycardia, rate 107 bpm, with wandering baseline and a lot of underlying artifact making it difficult to assess for ST/T changes. Telemetry:   Telemetry was personally reviewed and demonstrates:  Sinus tachycardia with rates ranging from the low 100s to  140s. Mostly in the 120s.  Relevant CV Studies:  Echocardiogram 08/10/2021: Impressions:  1. Left ventricular ejection fraction, by estimation, is 25 to 30%. The  left ventricle has severely decreased function. The left ventricle  demonstrates regional wall motion abnormalities. Unusual pattern with the  mid LV wall segments appearing akinetic  while the apex and the basal segments contract. Possibly a mid-wall  Takotsubo variant. Left ventricular diastolic function could not be  evaluated.   2. Right ventricular systolic function is mildly reduced. The right  ventricular size is normal.   3. The mitral valve is normal in structure. No evidence of mitral valve  regurgitation. No evidence of mitral stenosis.   4. The aortic valve was not well visualized. Aortic valve regurgitation  is not visualized. No aortic stenosis is present.   5. Technically difficult study with very limited images.   Laboratory Data:  High Sensitivity Troponin:  No results for input(s): TROPONINIHS in the last 720 hours.   Chemistry Recent Labs  Lab 08/10/21 1010 08/10/21 1244 08/10/21 2055 08/11/21 0225 08/11/21 0838 08/11/21 1439 08/12/21 0453  NA 136 133*  --  134*  --   --  135  K 2.5* 3.3*  --  4.1  --   --  3.2*  CL 115*  --   --  97*  --   --  101  CO2 16*  --   --  27  --   --  25  GLUCOSE 78  --    < > 138* 164* 98 117*  BUN <5*  --   --  5*  --   --  10  CREATININE 0.46*  --   --  0.86  --   --  0.93  CALCIUM 5.2*  --   --  8.8*  --   --  8.4*  GFRNONAA >60  --   --  >60  --   --  >60  ANIONGAP 5  --   --  10  --   --  9   < > = values in this interval not displayed.    Recent Labs  Lab 08/10/21 1010  PROT 3.9*  ALBUMIN 1.3*  AST 30  ALT 21  ALKPHOS 35*  BILITOT 0.6   Hematology Recent Labs  Lab 08/10/21 1010 08/10/21 1244 08/11/21 0447  WBC 4.2  --  11.1*  RBC  2.39*  --  4.44  HGB 7.5* 14.3 14.2  HCT 23.9* 42.0 42.5  MCV 100.0  --  95.7  MCH 31.4  --  32.0  MCHC 31.4  --  33.4  RDW 15.3  --  15.4  PLT 113*  --  161   BNP Recent Labs  Lab 08/10/21 1537  BNP 401.9*    DDimer No results for input(s): DDIMER in the last 168 hours.   Radiology/Studies:  CT HEAD WO CONTRAST (5MM)  Result Date: 08/10/2021 CLINICAL DATA:  Seizure, abnormal neurological exam EXAM: CT HEAD WITHOUT CONTRAST TECHNIQUE: Contiguous axial images were obtained from the base of the skull through the vertex without intravenous contrast. Sagittal and coronal MPR images reconstructed from axial data set. COMPARISON:  06/17/2009 FINDINGS: Brain: Generalized atrophy. Normal ventricular morphology. No midline shift or mass effect. Small vessel chronic ischemic changes of deep cerebral white matter. 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 No intracranial hemorrhage, mass lesion, evidence of acute infarction, or extra-axial fluid collection. Vascular: No hyperdense vessels. Atherosclerotic calcification of internal carotid arteries at skull  base. Skull: Intact Sinuses/Orbits: Clear.  Nasal septal deviation to the RIGHT. Other: N/A IMPRESSION: Atrophy with small vessel chronic ischemic changes of deep cerebral white matter. 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 No acute intracranial abnormalities. Electronically Signed   By: Lavonia Dana M.D.   On: 08/10/2021 12:14   DG Chest Port 1 View  Result Date: 08/11/2021 CLINICAL DATA:  Respiratory failure. EXAM: PORTABLE CHEST 1 VIEW COMPARISON:  August 10, 2021. FINDINGS: The heart size and mediastinal contours are within normal limits. Endotracheal and nasogastric tubes are unchanged in position. Stable right apical chronic scarring is noted. Stable right basilar scarring is noted as well. Stable left perihilar nodular opacity is noted. The visualized skeletal structures are unremarkable. IMPRESSION: Stable support  apparatus. Stable right basilar and apical scarring. Stable left perihilar nodular opacity is noted. No significant change compared to prior exam. Electronically Signed   By: Marijo Conception M.D.   On: 08/11/2021 08:07   DG Chest Portable 1 View  Result Date: 08/10/2021 CLINICAL DATA:  Intubation. EXAM: PORTABLE CHEST 1 VIEW COMPARISON:  One-view chest x-ray 08/10/2021 at 10:33 a.m. FINDINGS: Patient is now intubated. Endotracheal tube terminates 6 cm above the carina. NG tube courses off the inferior border the film. Stable chronic scarring at the right apex. Other patchy opacities unchanged. No significant superimposed acute disease. IMPRESSION: 1. Interval intubation. The endotracheal tube is in satisfactory position. 2. Stable patchy bilateral airspace disease without significant interval change. Electronically Signed   By: San Morelle M.D.   On: 08/10/2021 11:44   DG Chest Portable 1 View  Result Date: 08/10/2021 CLINICAL DATA:  Seizure, hypertension, dyspnea EXAM: PORTABLE CHEST 1 VIEW COMPARISON:  Portable exam 1046 hours compared to 04/16/2020 Correlation: CT chest 02/12/2020 FINDINGS: Normal heart size and pulmonary vascularity. Patchy chronic opacities in both lungs corresponding to peribronchial thickening, nodularity, and chronic interstitial prominence. Scarring and volume loss with bullous disease changes at RIGHT apex. Mild mediastinal shift to RIGHT, chronic. No definite acute infiltrate, pleural effusion or pneumothorax. Bones demineralized. IMPRESSION: Extensive chronic lung disease changes with peribronchial thickening, chronic nodularity and chronic interstitial disease. Chronic scarring and volume loss in RIGHT upper lobe with mediastinal shift to RIGHT. Electronically Signed   By: Lavonia Dana M.D.   On: 08/10/2021 10:58   DG Abd Portable 1V  Result Date: 08/10/2021 CLINICAL DATA:  Orogastric tube placement. EXAM: PORTABLE ABDOMEN - 1 VIEW COMPARISON:  Abdominal x-ray  03/14/2013. FINDINGS: Orogastric tube tip is at the level of the mid stomach. No dilated bowel loops are seen in the upper abdomen. IMPRESSION: Orogastric tube tip is at the level of the mid stomach. Electronically Signed   By: Ronney Asters M.D.   On: 08/10/2021 18:23   EEG adult  Result Date: 08/10/2021 Lora Havens, MD     08/10/2021  2:41 PM Patient Name: COSTA JHA MRN: [id number] Epilepsy Attending: Lora Havens Referring Physician/Provider: Dr Kerney Elbe Date: 08/10/2021 Duration: 22 mins Patient history: 60 y.o. male with a past medical hypertension and EtOH dependence BIB EMS after his wife complained of patient having a stroke.  EMS said that when they arrived he was actively seizing, tonic clonic in nature.  Given 5 mg Versed IM which stopped his seizure.  BG reportedly 70.  EEG to evaluate for status epilepticus. Level of alertness:  lethargic AEDs during EEG study: Versed, LEV Technical aspects: This EEG study was done with scalp electrodes positioned according to the 10-20 International  system of electrode placement. Electrical activity was acquired at a sampling rate of '500Hz'  and reviewed with a high frequency filter of '70Hz'  and a low frequency filter of '1Hz' . EEG data were recorded continuously and digitally stored. Description: EEG showed continuous generalized 2 to 3 Hz delta slowing with overriding 15 to 18 Hz beta activity. Hyperventilation and photic stimulation were not performed.   ABNORMALITY - Continuous slow, generalized IMPRESSION: This study is suggestive of severe diffuse encephalopathy, nonspecific etiology but likely related to sedation. No seizures or epileptiform discharges were seen throughout the recording. Dr. Kerney Elbe was notified. Priyanka Barbra Sarks   Overnight EEG with video  Result Date: 08/11/2021 Lora Havens, MD     08/12/2021 10:16 AM Patient Name: KERN GINGRAS MRN: [id number] Epilepsy Attending: Lora Havens Referring Physician/Provider: Dr Kerney Elbe Duration: 08/10/2021 1330 to 08/11/2021 1330  Patient history: 60 y.o. male with a past medical hypertension and EtOH dependence BIB EMS after his wife complained of patient having a stroke.  EMS said that when they arrived he was actively seizing, tonic clonic in nature.  Given 5 mg Versed IM which stopped his seizure.  BG reportedly 70.  EEG to evaluate for status epilepticus.  Level of alertness:  lethargic  AEDs during EEG study: Versed, LEV  Technical aspects: This EEG study was done with scalp electrodes positioned according to the 10-20 International system of electrode placement. Electrical activity was acquired at a sampling rate of '500Hz'  and reviewed with a high frequency filter of '70Hz'  and a low frequency filter of '1Hz' . EEG data were recorded continuously and digitally stored.  Description: EEG showed continuous generalized 2 to 3 Hz delta slowing with overriding 15 to 18 Hz beta activity. Hyperventilation and photic stimulation were not performed.    ABNORMALITY - Continuous slow, generalized  IMPRESSION: This study is suggestive of severe diffuse encephalopathy, nonspecific etiology but likely related to sedation. No seizures or epileptiform discharges were seen throughout the recording.  Lora Havens   ECHOCARDIOGRAM COMPLETE  Result Date: 08/10/2021    ECHOCARDIOGRAM REPORT   Patient Name:   BAILEN GEFFRE Date of Exam: 08/10/2021 Medical Rec #:  [id number]      Height:       73.0 in Accession #:    [id number]     Weight:       160.7 lb Date of Birth:  [birth year]      BSA:          1.961 m Patient Age:    49 years       BP:           114/91 mmHg Patient Gender: M              HR:           122 bpm. Exam Location:  Inpatient Procedure: 2D Echo, Color Doppler and Cardiac Doppler Indications:    Dyspnea  History:        Patient has no prior history of Echocardiogram examinations.                 Respiratory failure.  Sonographer:    Merrie Roof RDCS Referring Phys: [place]  Comments: Very difficult suboptimal images due to patient condition, position and tachycardia IMPRESSIONS  1. Left ventricular ejection fraction, by estimation, is 25 to 30%. The left ventricle has severely decreased function. The left ventricle demonstrates regional wall motion abnormalities. Unusual pattern with the mid  LV wall segments appearing akinetic while the apex and the basal segments contract. Possibly a mid-wall Takotsubo variant. Left ventricular diastolic function could not be evaluated.  2. Right ventricular systolic function is mildly reduced. The right ventricular size is normal.  3. The mitral valve is normal in structure. No evidence of mitral valve regurgitation. No evidence of mitral stenosis.  4. The aortic valve was not well visualized. Aortic valve regurgitation is not visualized. No aortic stenosis is present.  5. Technically difficult study with very limited images. FINDINGS  Left Ventricle: Left ventricular ejection fraction, by estimation, is 25 to 30%. The left ventricle has severely decreased function. The left ventricle demonstrates regional wall motion abnormalities. The left ventricular internal cavity size was normal  in size. There is no left ventricular hypertrophy. Left ventricular diastolic function could not be evaluated. Right Ventricle: The right ventricular size is normal. No increase in right ventricular wall thickness. Right ventricular systolic function is mildly reduced. Left Atrium: Left atrial size was normal in size. Right Atrium: Right atrial size was normal in size. Pericardium: There is no evidence of pericardial effusion. Mitral Valve: The mitral valve is normal in structure. No evidence of mitral valve regurgitation. No evidence of mitral valve stenosis. Tricuspid Valve: The tricuspid valve is normal in structure. Tricuspid valve regurgitation is not demonstrated. Aortic Valve: The aortic valve was not well visualized. Aortic valve regurgitation is not  visualized. No aortic stenosis is present. Pulmonic Valve: The pulmonic valve was not well visualized. Pulmonic valve regurgitation is not visualized. Aorta: The aortic root was not well visualized. Venous: The inferior vena cava was not well visualized. IAS/Shunts: No atrial level shunt detected by color flow Doppler. [employer] Electronically signed by Franki Monte Signature Date/Time: 08/10/2021/6:01:36 PM    Final      Assessment and Plan:   New Onset Systolic CHF - Patient admitted with seizures (suspected to be due to alcohol withdrawal) and found to have a newly reduced EF.  - BNP 401.9.  - Chest x-ray on 08/11/2021 showed stable right basilar and apical scarring but no edema noted. - Echo showed LVEF of 25 to 30% with akinesis of mid LV wall segments while the apex and basal segments contract.  Possibly a mid wall Takotsubo variant.  RV normal in size with mildly reduced systolic function. - Has not received any diuresis. Does not appear volume overloaded on exam so think we can hold off on this at this time. - Coreg was ordered but not able to give due to trouble swallowing. Speech Consult placed. When able to take PO, would also recommend adding Entresto. 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 Per wife patient drinks >80 ounces of beer daily and has recently started adding liquor to that. Cessation will be incredibly important. Can repeat Echo in 3 months after optimization of GDMT if patient is able to quit drinking. If EF still low at that time, can consider ischemic evaluation.  Sinus Tachycardia - Rates mostly in the 120s but ranging from 100s to 140s.  - Suspect secondary to alcohol withdrawal. - Will add beta-blocker when able to swallow given CHF.  Bacteremia - Plan is for TEE on Friday 08/14/2021.  - No family at bedside and unsure if patient can fully consent today. Will need to try to get consent tomorrow. -  Otherwise, management per primary team.  Otherwise, per primary team: - Acute hypoxic respiratory failure requiring intubation (extubated 08/12/2021) -  Seizure in the setting of likely ETOH withdrawal: On Keppra and Fosphenytoin - Lactic acidosis: resolved - Type 2 diabetes mellitus - Alcohol abuse  Risk Assessment/Risk Scores:    New York Heart Association (NYHA) Functional Class NYHA Class I   For questions or updates, please contact [facility] Please consult [email or web address] for contact info under    Signed, Darreld Mclean, PA-C  08/12/2021 2:34 PM And seizure stopped.  Upon arrival to the ED patient was still in [place] postictal state

## 2021-08-12 NOTE — Progress Notes (Signed)
[facility] ADULT ICU REPLACEMENT PROTOCOL   The patient does apply for the [facility] Adult ICU Electrolyte Replacment Protocol based on the criteria listed below:   1.Exclusion criteria: TCTS patients, ECMO patients and Hypothermia Protocol, and   Dialysis patients 2. Is GFR >/= 30 ml/min? Yes.    Patient's GFR today is >60 3. Is SCr </= 2? Yes.   Patient's SCr is 0.93 mg/dL 4. Did SCr increase >/= 0.5 in 24 hours? No. 5.Pt's weight >40kg  Yes.   6. Abnormal electrolyte(s): K+ 3.2  7. Electrolytes replaced per protocol 8.  Call MD STAT for K+ </= 2.5, Phos </= 1, or Mag </= 1 Physician:  Dr. Eliseo Gum, Loni Beckwith 08/12/2021 6:09 AM

## 2021-08-12 NOTE — Progress Notes (Signed)
Reassessed post- extubation. Awake, no respiratory distress. HR 120s, regular. EKG with sinus tachycardia. May need low-dose metoprolol vs coreg once able to swallow. SLP consult placed.  Steffanie Dunn, DO 08/12/21 10:11 AM [facility]

## 2021-08-12 NOTE — TOC Initial Note (Signed)
Transition of Care [facility]) - Initial/Assessment Note    Patient Details  Name: Frederick [name] MRN: [id number] Date of Birth: [birth year]  Transition of Care [facility]) CM/SW Contact:    Glennon Mac, RN Phone Number: 08/12/2021, 3:37 PM  Clinical Narrative: Patient admitted after a tonic clonic seizure on 08/10/2021, witnessed by his wife.  Patient with significant, heavy alcohol use; he required intubation for hypoxemic respiratory failure and airway protection. Patient extubated today; plan physical and occupational therapy evaluations when patient appropriate.  Spoke with patient's wife, at her request by phone.  She states that she and patient are not married, but have been together for over 10 years.  Patient has no other children or family members to assist with decision-making.  Significant other is concerned that patient will need rehabilitation at discharge; she states that she and patient "may be separating", and she is in "the process of moving".  Await PT/OT recommendations for level of care needed at discharge; significant other states patient can stand briefly but is unable to walk.  He spends the majority of his time in a Hoveround wheelchair, per her report.  Transitions of Care to follow/assist with disposition pending progress with therapies.                 Expected Discharge Plan: Skilled Nursing Facility Barriers to Discharge: Continued Medical Work up      Expected Discharge Plan and Services Expected Discharge Plan: Skilled Nursing Facility   Discharge Planning Services: CM Consult   Living arrangements for the past 2 months: Single Family Home                                      Prior Living Arrangements/Services Living arrangements for the past 2 months: Single Family Home Lives with:: Significant Other Patient language and need for interpreter reviewed:: Yes        Need for Family Participation in Patient Care: Yes (Comment) Care giver support system in  place?: No (comment)   Criminal Activity/Legal Involvement Pertinent to Current Situation/Hospitalization: No - Comment as needed                 Emotional Assessment Appearance:: Appears stated age Attitude/Demeanor/Rapport: Lethargic Affect (typically observed): Appropriate Orientation: : Oriented to Self Alcohol / Substance Use: Alcohol Use    Admission diagnosis:  Status epilepticus (HCC) [G40.901] Seizures (HCC) [R56.9] Patient Active Problem List   Diagnosis Date Noted   Malnutrition of moderate degree 08/12/2021   Seizures (HCC) 08/10/2021   Status epilepticus (HCC)    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   Osteomyelitis of index finger of right hand (HCC) 05/28/2021   Marijuana abuse 07/22/2017   Hyponatremia 12/03/2015   Left rib fracture 11/29/2015   Microcytic anemia 11/29/2015   Cigarette smoker 11/29/2015   Protein-calorie malnutrition, severe (HCC) 11/29/2015   Unintentional weight loss 11/29/2015   History of tuberculosis 11/28/2015   Cavitary lesion of lung 11/28/2015   SBO (small bowel obstruction) (HCC) 03/10/2013   Abdominal pain, diffuse 03/10/2013   Nausea and vomiting 03/10/2013   Fracture of fibula, proximal 01/21/2013   Closed fracture of left distal distal tibia 01/21/2013   EtOH dependence (HCC)    Fibula fracture    Hernia    PCP:  [facility] Pharmacy:   [id number] - [place], [place] - [address] OF  [address] [place] [address] Phone: [phone] Fax: [phone]     Social Determinants of Health (SDOH) Interventions    Readmission Risk Interventions No flowsheet data found.  Quintella Baton, RN, BSN  Trauma/Neuro ICU Case Manager [phone]

## 2021-08-12 NOTE — Progress Notes (Signed)
NAME:  GLYN GERADS, MRN:  [id number], DOB:  1961/07/16, LOS: 2 ADMISSION DATE:  08/10/2021, CONSULTATION DATE:  08/10/2022 REFERRING MD:  , CHIEF COMPLAINT:  Seizure with airway compromise  All information obtained from Medical records  History of Present Illness:  Frederick [name] is an 60 y.o. male  current every day smoker , chronic heavy beer drinker (significantly > 80 oz per day per wife) with more recent escalation of intake to include hard liquor, but with decreased intake yesterday relative to his baseline. (Drank "only" two 40 oz beers per wife) presenting from home with a 1 week history of recurrent focal seizure activity based on wife's description. She had called EMS for what she felt was a stroke. On EMS arrival, he was actively seizing with GTC seizure and was administered 5 mg IM Versed which stopped the seizure. CBG was 70 in the field per EMS,  without cessation of seizure activity. O2 was originally 80% on room air however he went up to 93 on nasal cannula. In the ED he received 2 mg IV Ativan and 3000 mg IV Keppra, also without cessation.   Pt decompensated and was intubated for airway protection in setting of seizures in the ED after second dose of Ativan.   In the ED Head imaging was negative for acute intracranial abnormalities. 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  Of note patient has a history of TB which was diagnosed in 2001. He was treated at that time by the [facility] x 6 months. Pt had recurrence in 2016 , and was treated with  isoniazid, rifampin, ethambutol, pyrazinamide and B6 by the [facility].  Labs reviewed : K 2.5, Chloride 115, CO2 16, Creatinine 0.47,Calcium 5.2, Albumin 1.3, Alk Phos 35 HGB 7.5, WBC 4.2, platelets of 113 T Max of 98.8  CXR 9/5>>   Extensive chronic lung disease changes with  peribronchial thickening, chronic nodularity and chronic interstitial disease.   Chronic scarring and volume loss in RIGHT upper lobe with mediastinal shift to RIGHT  Pertinent  Medical History   Past Medical History:  Diagnosis Date   Arthritis    Asthma    Bilateral lower extremity edema 05/28/2021   propping feet up   Cough 05/28/2021   with clear sputum   Dyspnea    Dyspnea 05/28/2021   with exertion   EtOH dependence (HCC)    drinks 2-3 of 40ounes a day   Fibula fracture    Dr Mardelle Matte 02-2011   Finger osteomyelitis, right ([place]) 05/26/2021   right index finger   Hernia    surgery 01-17-13   Hypertension    not taking bp meds since may 2022   Tuberculosis    couple of yrs ago took tx at [facility] per pt on 05-28-2021     Significant Hospital Events: Including procedures, antibiotic start and stop dates in addition to other pertinent events   Admission 08/10/2022  Interim History / Subjective:  Agitated overnight, given fentanyl bolus and propofol increased.  Objective   Blood pressure 132/87, pulse (!) 106, temperature 97.6 F (36.4 C), temperature source Axillary, resp. rate 14, height 6' 1" (1.854 m), weight 72.9 kg, SpO2 100 %.    Vent Mode: PRVC FiO2 (%):  [30 %-40 %] 30 % Set Rate:  [14 bmp-15 bmp] 14 bmp Vt Set:  [  630 mL] 630 mL PEEP:  [5 cmH20] 5 cmH20 Pressure Support:  [12 cmH20] 12 cmH20 Plateau Pressure:  [17 cmH20-19 cmH20] 17 cmH20   Intake/Output Summary (Last 24 hours) at 08/12/2021 0727 Last data filed at 08/12/2021 0600 Gross per 24 hour  Intake 2270.73 ml  Output 800 ml  Net 1470.73 ml    Filed Weights   08/10/21 1000 08/10/21 1627  Weight: 75 kg 72.9 kg    Examination: General: critically ill appearing man lying in bed in NAD HENT: [place]/AT, ETT in place Lungs: CTAB, thick secretions from ETT Cardiovascular: S1S2, tachycardic, reg rhythm, sinus tach on teel. Abdomen:  soft, ND, grimaces with palpation Extremities:  no peripheral edema, no cyanosis Neuro: grimaces and will not open eyes during exam. Sitting up in bed and moving extremities after suctioning. Derm: warm, dry, no rashes  K+3.2 BUN 10 Cr 0.93  Resp culture>  Blood cultures> GPC  2/4 bottles - aerobic bottles only, BCID strep  Echo: LVEF 25-30%, RWMA with akinetic mid-LV wall segments- possibly variant of Takotsubo. Mildly reduced RV function. Limited evaluation of valves.  EEG> severe diffuse encephalopathy  Resolved Hospital Problem list   hypokalemia  Assessment & Plan:  Acute hypoxic respiratory failure in setting of new onset seizures, suspected to be due to ETOH withdrawal. 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 Plan -Con't LTVV, 4-8cc/kg IBW -aggressively weaning sedation; PAD protocol -VAP prevention protocol -wean FiO2 to maintain SpO2 >90% -SAT & SBT; hopeful for extubation today -con't unasyn, follow resp culture  Strep bacteremia; from separate sites this is less likely a contaminant -repeat blood cultures today -may need TEE -con't unasyn -no CVCs  Acute HFrEF- possibly stress induced CM -Will need GDMT and possibly ischemia evaluation this admission. Will consult cardiology for evaluation.  Seizures in setting of likely ETOH withdrawal No acute intracranial abnormalities on CT -appreciate Neurology's assistance; con't keppra and fosphenytoin. -cEEG per Neuro -maintain seizure precautions   Lactic acidosis, resolved -no additional monitoring  Hypokalemia -repleted -monitor  DM with controlled hyperglycemia. Fingersticks not correlating with serum samples. -SSI PRN -goal BG 140-180 -fingersticks unreliable  Best Practice (right click and "Reselect all SmartList Selections" daily)   Diet/type: tubefeeds DVT prophylaxis: LMWH GI prophylaxis: PPI Lines: N/A Foley:  N/A Code Status:  full code Last date of  multidisciplinary goals of care discussion [ ]  Labs   CBC: Recent Labs  Lab 08/10/21 1010 08/10/21 1244 08/11/21 0447  WBC 4.2  --  11.1*  NEUTROABS 3.0  --   --   HGB 7.5* 14.3 14.2  HCT 23.9* 42.0 42.5  MCV 100.0  --  95.7  PLT 113*  --  161     Basic Metabolic Panel: Recent Labs  Lab 08/10/21 1010 08/10/21 1244 08/10/21 1537 08/10/21 2055 08/11/21 0225 08/11/21 0838 08/11/21 1439 08/11/21 2030 08/12/21 0453  NA 136 133*  --   --  134*  --   --   --  135  K 2.5* 3.3*  --   --  4.1  --   --   --  3.2*  CL 115*  --   --   --  97*  --   --   --  101  CO2 16*  --   --   --  27  --   --   --  25  GLUCOSE 78  --   --  225* 138* 164* 98  --  117*  BUN <5*  --   --   --  5*  --   --   --  10  CREATININE 0.46*  --   --   --  0.86  --   --   --  0.93  CALCIUM 5.2*  --   --   --  8.8*  --   --   --  8.4*  MG  --   --  1.3*  --  1.3*  --   --  1.9 1.8  PHOS  --   --   --   --  2.3*  --   --   --  3.9    GFR: Estimated Creatinine Clearance: 86 mL/min (by C-G formula based on SCr of 0.93 mg/dL). Recent Labs  Lab 08/10/21 1010 08/10/21 1537 08/11/21 0225 08/11/21 0447 08/11/21 0820  PROCALCITON  --  5.20  --   --   --   WBC 4.2  --   --  11.1*  --   LATICACIDVEN  --  3.3* 7.5*  --  2.6*     This patient is critically ill with multiple organ system failure which requires frequent high complexity decision making, assessment, support, evaluation, and titration of therapies. This was completed through the application of advanced monitoring technologies and extensive interpretation of multiple databases. During this encounter critical care time was devoted to patient care services described in this note for 34 minutes.  [name] Hy, DO 08/12/21 7:43 AM [facility]

## 2021-08-12 NOTE — Progress Notes (Signed)
    [facility] has been requested to perform a transesophageal echocardiogram on Frederick [name] for bacteremia.  After careful review of history and examination, the risks and benefits of transesophageal echocardiogram have been explained including risks of esophageal damage, perforation (1:10,000 risk), bleeding, pharyngeal hematoma as well as other potential complications associated with conscious sedation including aspiration, arrhythmia, respiratory failure and death. Alternatives to treatment were discussed, questions were answered. Patient is currently in alcohol withdrawal and not confident that he is able to consent. Called and spoke with wife, Gillian Shields, and reviewed the above and she agreed to consent.  RN Alycia Rossetti confirmed consent - we signed consent form and placed it in paper chart.  Procedure scheduled for 08/14/2021 at 10:00am with Dr. Eden Emms.  Corrin Parker, PA-C 08/12/2021 5:19 PM

## 2021-08-12 NOTE — Progress Notes (Signed)
Subjective: No seizures overnight.  Extubated.  ROS: negative except above  Examination  Vital signs in last 24 hours: Temp:  [97.6 F (36.4 C)-99.4 F (37.4 C)] 98.8 F (37.1 C) (09/07 1200) Pulse Rate:  [99-138] 126 (09/07 1200) Resp:  [14-32] 24 (09/07 1200) BP: (97-181)/(73-122) 153/97 (09/07 1200) SpO2:  [91 %-100 %] 100 % (09/07 1200) FiO2 (%):  [30 %-40 %] 30 % (09/07 0725)  General: lying in be, NAD CVS: pulse-normal rate and rhythm RS: breathing comfortably, CTAB Extremities: normal, warm Neuro: awake, alert, oriented to self and place, follows commands, CN 2-12 grossly intact, antigravity in all extremities  Basic Metabolic Panel: Recent Labs  Lab 08/10/21 1010 08/10/21 1244 08/10/21 1537 08/10/21 2055 08/11/21 0225 08/11/21 0838 08/11/21 1439 08/11/21 2030 08/12/21 0453  NA 136 133*  --   --  134*  --   --   --  135  K 2.5* 3.3*  --   --  4.1  --   --   --  3.2*  CL 115*  --   --   --  97*  --   --   --  101  CO2 16*  --   --   --  27  --   --   --  25  GLUCOSE 78  --   --  225* 138* 164* 98  --  117*  BUN <5*  --   --   --  5*  --   --   --  10  CREATININE 0.46*  --   --   --  0.86  --   --   --  0.93  CALCIUM 5.2*  --   --   --  8.8*  --   --   --  8.4*  MG  --   --  1.3*  --  1.3*  --   --  1.9 1.8  PHOS  --   --   --   --  2.3*  --   --   --  3.9    CBC: Recent Labs  Lab 08/10/21 1010 08/10/21 1244 08/11/21 0447  WBC 4.2  --  11.1*  NEUTROABS 3.0  --   --   HGB 7.5* 14.3 14.2  HCT 23.9* 42.0 42.5  MCV 100.0  --  95.7  PLT 113*  --  161     Coagulation Studies: No results for input(s): LABPROT, INR in the last 72 hours.  Imaging No new brain imaging overnight  ASSESSMENT AND PLAN: 60 year old male with history of alcohol use presented with generalized tonic-clonic status epilepticus.    Convulsive status epilepticus, resolved Alcohol use disorder -Status epilepticus in the setting of alcohol withdrawal -LTM EEG overnight did not  show any seizures   Recommendation -DC LTM as no seizures after weaning sedation  -Continue Keppra 1000 mg twice daily.  These could be provoked seizures in the setting of alcohol withdrawal.  However, as patient presented in status epilepticus, would recommend discharging on Keppra 1000 mg twice daily. This could likely be weaned off in a few months. - MRI brain without contrast to look for any acute abnormality -Patient's alcohol level was less than 10 on arrival, recommend CIWA protocol and treat with benzodiazepine taper if needed -Continue thiamine 100 mg.  Can consider high-dose IV thiamine if needed after weaning sedation -Continue seizure precautions -As needed IV Versed for clinical seizure-like activity -Management of rest of comorbidities per primary team -Follow-up with neurology  in 6 to 8 weeks after discharge  Lyah [facility]ons [facility] For questions after 5pm please refer to AMION to reach the Neurologist on call

## 2021-08-12 NOTE — Procedures (Signed)
Extubation Procedure Note  Patient Details:   Name: Frederick [name] DOB: [birth year] MRN: [id number]   Airway Documentation:    Vent end date: 08/12/21 Vent end time: 0904   Evaluation  O2 sats: stable throughout Complications: No apparent complications Patient did tolerate procedure well. Bilateral Breath Sounds: Rhonchi, Diminished   Yes  Positive cuff leak noted.  Patient placed on [place] 4L with humidity, no stridor noted.  Rayburn Felt 08/12/2021, 9:32 AM

## 2021-08-13 DIAGNOSIS — I5021 Acute systolic (congestive) heart failure: Secondary | ICD-10-CM | POA: Diagnosis not present

## 2021-08-13 DIAGNOSIS — F10231 Alcohol dependence with withdrawal delirium: Secondary | ICD-10-CM | POA: Diagnosis not present

## 2021-08-13 DIAGNOSIS — B951 Streptococcus, group B, as the cause of diseases classified elsewhere: Secondary | ICD-10-CM | POA: Diagnosis not present

## 2021-08-13 DIAGNOSIS — J9601 Acute respiratory failure with hypoxia: Secondary | ICD-10-CM | POA: Diagnosis not present

## 2021-08-13 DIAGNOSIS — G9341 Metabolic encephalopathy: Secondary | ICD-10-CM | POA: Diagnosis not present

## 2021-08-13 DIAGNOSIS — R7881 Bacteremia: Secondary | ICD-10-CM | POA: Diagnosis not present

## 2021-08-13 DIAGNOSIS — I426 Alcoholic cardiomyopathy: Secondary | ICD-10-CM

## 2021-08-13 LAB — CULTURE, RESPIRATORY W GRAM STAIN: Culture: NORMAL

## 2021-08-13 LAB — BASIC METABOLIC PANEL
Anion gap: 9 (ref 5–15)
BUN: 7 mg/dL — ABNORMAL LOW (ref 8–23)
CO2: 27 mmol/L (ref 22–32)
Calcium: 8.8 mg/dL — ABNORMAL LOW (ref 8.9–10.3)
Chloride: 99 mmol/L (ref 98–111)
Creatinine, Ser: 0.82 mg/dL (ref 0.61–1.24)
GFR, Estimated: >60 mL/min (ref >60)
Glucose, Bld: 96 mg/dL (ref 70–99)
Potassium: 3.8 mmol/L (ref 3.5–5.1)
Sodium: 135 mmol/L (ref 135–145)

## 2021-08-13 LAB — GLUCOSE, CAPILLARY
Glucose-Capillary: 96 mg/dL (ref 70–99)
Glucose-Capillary: 85 mg/dL (ref 70–99)
Glucose-Capillary: 85 mg/dL (ref 70–99)
Glucose-Capillary: 62 mg/dL — ABNORMAL LOW (ref 70–99)
Glucose-Capillary: 155 mg/dL — ABNORMAL HIGH (ref 70–99)
Glucose-Capillary: 46 mg/dL — ABNORMAL LOW (ref 70–99)
Glucose-Capillary: 114 mg/dL — ABNORMAL HIGH (ref 70–99)

## 2021-08-13 LAB — PHOSPHORUS: Phosphorus: 3.5 mg/dL (ref 2.5–4.6)

## 2021-08-13 LAB — MAGNESIUM: Magnesium: 1.7 mg/dL (ref 1.7–2.4)

## 2021-08-13 MED ORDER — ADULT MULTIVITAMIN W/MINERALS CH
1.0000 | ORAL_TABLET | Freq: Every day | ORAL | Status: DC
  Administered 2021-08-15 – 2021-08-23 (×9): 1
  Filled 2021-08-13 (×9): qty 1

## 2021-08-13 MED ORDER — SODIUM CHLORIDE 0.9 % IV SOLN
2.0000 g | INTRAVENOUS | Status: DC
  Administered 2021-08-13 – 2021-08-16 (×4): 2 g via INTRAVENOUS
  Filled 2021-08-13 (×5): qty 20

## 2021-08-13 MED ORDER — DEXTROSE 50 % IV SOLN: 25.0000 g | INTRAVENOUS | Status: AC

## 2021-08-13 MED ORDER — THIAMINE HCL 100 MG/ML IJ SOLN
100.0000 mg | Freq: Every day | INTRAMUSCULAR | Status: DC
  Administered 2021-08-13 – 2021-08-16 (×4): 100 mg via INTRAVENOUS
  Filled 2021-08-13 (×4): qty 2

## 2021-08-13 MED ORDER — OSMOLITE 1.5 CAL PO LIQD
1000.0000 mL | ORAL | Status: DC
  Administered 2021-08-14 – 2021-08-19 (×7): 1000 mL

## 2021-08-13 MED ORDER — DEXTROSE 50 % IV SOLN
INTRAVENOUS | Status: AC
  Filled 2021-08-13: qty 50

## 2021-08-13 MED ORDER — DEXTROSE 50 % IV SOLN
12.5000 g | INTRAVENOUS | Status: AC
  Administered 2021-08-13: 12.5 g via INTRAVENOUS

## 2021-08-13 MED ORDER — PROSOURCE TF PO LIQD
45.0000 mL | Freq: Two times a day (BID) | ORAL | Status: DC
  Administered 2021-08-14 – 2021-08-23 (×19): 45 mL
  Filled 2021-08-13 (×19): qty 45

## 2021-08-13 MED ORDER — DEXTROSE 10 % IV SOLN: INTRAVENOUS | Status: DC

## 2021-08-13 MED ORDER — DEXTROSE 50 % IV SOLN
INTRAVENOUS | Status: AC
  Administered 2021-08-13: 25 g via INTRAVENOUS
  Filled 2021-08-13: qty 50

## 2021-08-13 MED ORDER — MAGNESIUM SULFATE 2 GM/50ML IV SOLN: 2.0000 g | Freq: Once | INTRAVENOUS | Status: DC

## 2021-08-13 NOTE — Progress Notes (Signed)
SLP Cancellation Note  Patient Details Name: ROBERT SPERL MRN: [id number] DOB: [birth year]   Cancelled treatment:        Reason eval/treat not completed: Patient very lethargic and not attending to SLP or RN. SLP will follow for evaluation when pt demonstrates readiness.   Jeannie Done, SLP-Student    [facility] Ailanie Ruttan 08/13/2021, 11:32 AM

## 2021-08-13 NOTE — Progress Notes (Signed)
NAME:  Frederick [name], MRN:  [id number], DOB:  02-03-1961, LOS: 3 ADMISSION DATE:  08/10/2021, CONSULTATION DATE:  08/10/2022 REFERRING MD:  , CHIEF COMPLAINT:  Seizure with airway compromise  History of Present Illness:  Frederick [name] is an 60 y.o. male  current every day smoker , chronic heavy beer drinker (significantly > 80 oz per day per wife) with more recent escalation of intake to include hard liquor, but with decreased intake yesterday relative to his baseline. (Drank "only" two 40 oz beers per wife) presenting from home with a 1 week history of recurrent focal seizure activity based on wife's description. She had called EMS for what she felt was a stroke. On EMS arrival, he was actively seizing with GTC seizure and was administered 5 mg IM Versed which stopped the seizure. CBG was 70 in the field per EMS,  without cessation of seizure activity. O2 was originally 80% on room air however he went up to 93 on nasal cannula. In the ED he received 2 mg IV Ativan and 3000 mg IV Keppra, also without cessation.   Pt decompensated and was intubated for airway protection in setting of seizures in the ED after second dose of Ativan.   In the ED Head imaging was negative for acute intracranial abnormalities. 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  Of note patient has a history of TB which was diagnosed in 2001. He was treated at that time by the [facility] x 6 months. Pt had recurrence in 2016 , and was treated with  isoniazid, rifampin, ethambutol, pyrazinamide and B6 by the [facility].  Labs reviewed : K 2.5, Chloride 115, CO2 16, Creatinine 0.47,Calcium 5.2, Albumin 1.3, Alk Phos 35 HGB 7.5, WBC 4.2, platelets of 113 T Max of 98.8  CXR 9/5>>  Extensive chronic lung disease changes with peribronchial thickening, chronic nodularity and  chronic interstitial disease.   Chronic scarring and volume loss in RIGHT upper lobe with mediastinal shift to RIGHT  Pertinent  Medical History   Past Medical History:  Diagnosis Date   Arthritis    Asthma    Bilateral lower extremity edema 05/28/2021   propping feet up   Cough 05/28/2021   with clear sputum   Dyspnea    Dyspnea 05/28/2021   with exertion   EtOH dependence (HCC)    drinks 2-3 of 40ounes a day   Fibula fracture    Dr Mardelle Matte 02-2011   Finger osteomyelitis, right ([place]) 05/26/2021   right index finger   Hernia    surgery 01-17-13   Hypertension    not taking bp meds since may 2022   Tuberculosis    couple of yrs ago took tx at [facility] per pt on 05-28-2021     Significant Hospital Events: Including procedures, antibiotic start and stop dates in addition to other pertinent events   Admission 08/10/2022 Started on unasyn for presumed aspiration pneumonia 9/6; overnight blood cultures from admission returned positive.  Extubated 9/7  Interim History / Subjective:  Requiring ativan for CIWA- gets more alert with this.   Objective   Blood pressure (!) 143/94, pulse (!) 106, temperature 99 F (37.2 C), temperature source Axillary, resp. rate (!) 30, height _0  (1.854 m), weight 72.9 kg, SpO2 97 %.    Vent Mode: PRVC FiO2 (%):  [30 %-  40 %] 30 % Set Rate:  [14 bmp] 14 bmp Vt Set:  [630 mL] 630 mL PEEP:  [5 cmH20] 5 cmH20 Plateau Pressure:  [17 cmH20] 17 cmH20   Intake/Output Summary (Last 24 hours) at 08/13/2021 0711 Last data filed at 08/13/2021 0600 Gross per 24 hour  Intake 1028.69 ml  Output 960 ml  Net 68.69 ml    Filed Weights   08/10/21 1000 08/10/21 1627  Weight: 75 kg 72.9 kg    Examination: General: chronically ill appearing man lying in bed in NAD HENT: [place]/AT, dry lips Lungs: tachypnea without accessory muscle use. Moderate strength cough. Mild rhonchi cleared with suctioning.  Cardiovascular: S1S2, RRR Abdomen:   soft, ND Extremities: no peripheral edema, no cyanosis or clubbing Neuro: Grimaces, sometimes answers questions, moving all extremities spontaneously.  Derm: no rashes, warm & dry  BUN 7 Cr 0.82  Resp culture> few WBCs Blood cultures> GPC  2/4 bottles - aerobic bottles only, Strep anginosis  cEEG> moderate diffuse encephalopathy  Resolved Hospital Problem list   hypokalemia  Assessment & Plan:  Acute hypoxic respiratory failure in setting of new onset seizures, suspected to be due to ETOH withdrawal. 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 Concern for aspiration pneumonia -con't to follow respiratory culture; can deescalate to ceftriaxone today if remains negative -wean supplemental O2 as able to maintain SpO2>90% -Needs ongoing ICU care for respiratory monitoring with encephalopathy. Hopeful that he will be more alert this afternoon and hopefully be ready to transfer out of ICU. -NTS PRN  Acute metabolic encephalopathy due to ETOH withdrawal -CIWA -reoritentation -not able to cooperate for MRI at this time  Strep bacteremia; from separate sites this is less likely a contaminant -repeat blood cultures pending from 9/7 -TEE tomorrow; NPO p midnight -Con't unasyn; may switch to ceftriaxone if respiratory culture remains negative -no CVCs  Acute HFrEF- most likely due to ETOH cardiomyopathy -Start coreg and entresto once able to take PO meds -Appreciate cardiology's input. No ischemia evaluation needed.  HTN -labetalol PRN -will start coreg and Entresto once taking PO/ enteral meds  Seizures in setting of likely ETOH withdrawal No acute intracranial abnormalities on CT -appreciate Neurology's assistance; con't keppra, which will be weaned off as an outpatient. -Maintain seizure precautions  -Needs brain MRI without contrast per neuro once able to reliably remain still.  DM, no  hyperglycemia while off TF. Fingersticks not correlating with serum samples. -SSI PRN -goal BG 140-180 -fingersticks unreliable  Moderate protein energy malnutrition due to ETOH abuse -needs Cortrak to resume TF if unable to pass swallow evaluation today. -vitamins; switch thiamine to IV for now  Deconditioning -PT, OT, SLP  Wife Katharine Look updated by phone.  Best Practice (right click and "Reselect all SmartList Selections" daily)   Diet/type: NPO- pending SLP evaluation, NPO p mn for TEE Fri DVT prophylaxis: LMWH GI prophylaxis: N/A Lines: N/A Foley:  N/A Code Status:  full code Last date of multidisciplinary goals of care discussion [ 9/8- wife Sandra]  Labs   CBC: Recent Labs  Lab 08/10/21 1010 08/10/21 1244 08/11/21 0447  WBC 4.2  --  11.1*  NEUTROABS 3.0  --   --   HGB 7.5* 14.3 14.2  HCT 23.9* 42.0 42.5  MCV 100.0  --  95.7  PLT 113*  --  161     Basic Metabolic Panel: Recent Labs  Lab 08/10/21 1010 08/10/21 1244 08/10/21 1537 08/10/21 2055 08/11/21 0225 08/11/21  1886 08/11/21 1439 08/11/21 2030 08/12/21 0453 08/13/21 0347  NA 136 133*  --   --  134*  --   --   --  135 135  K 2.5* 3.3*  --   --  4.1  --   --   --  3.2* 3.8  CL 115*  --   --   --  97*  --   --   --  101 99  CO2 16*  --   --   --  27  --   --   --  25 27  GLUCOSE 78  --   --    < > 138* 164* 98  --  117* 96  BUN <5*  --   --   --  5*  --   --   --  10 7*  CREATININE 0.46*  --   --   --  0.86  --   --   --  0.93 0.82  CALCIUM 5.2*  --   --   --  8.8*  --   --   --  8.4* 8.8*  MG  --   --  1.3*  --  1.3*  --   --  1.9 1.8 1.7  PHOS  --   --   --   --  2.3*  --   --   --  3.9 3.5   < > = values in this interval not displayed.    GFR: Estimated Creatinine Clearance: 97.5 mL/min (by C-G formula based on SCr of 0.82 mg/dL). Recent Labs  Lab 08/10/21 1010 08/10/21 1537 08/11/21 0225 08/11/21 0447 08/11/21 0820  PROCALCITON  --  5.20  --   --   --   WBC 4.2  --   --  11.1*  --    LATICACIDVEN  --  3.3* 7.5*  --  2.6*     This patient is critically ill with multiple organ system failure which requires frequent high complexity decision making, assessment, support, evaluation, and titration of therapies. This was completed through the application of advanced monitoring technologies and extensive interpretation of multiple databases. During this encounter critical care time was devoted to patient care services described in this note for 37 minutes.  [name] Hy, DO 08/13/21 8:00 AM [facility]

## 2021-08-13 NOTE — Progress Notes (Addendum)
Nutrition Follow-up  DOCUMENTATION CODES:   Non-severe (moderate) malnutrition in context of chronic illness  INTERVENTION:   Once Cortrak placed on 08/14/21 and placement is confirmed, initiate enteral nutrition: - Osmolite 1.5 @ 55 ml/hr (1320 ml/day) - ProSource TF 45 ml BID  Tube feeding regimen provides 2060 kcal, 105 grams of protein, and 1006 ml of H2O.  Once enteral nutrition is initiated, monitor magnesium, potassium, and phosphorus twice daily for at least 3 days, MD to replete as needed, as pt is at risk for refeeding syndrome given malnutrition, hx EtOH abuse.  - MVI with minerals daily per tube  - RD will follow for diet advancement and add oral nutrition supplements as appropriate  NUTRITION DIAGNOSIS:   Moderate Malnutrition related to chronic illness (ETOH abuse) as evidenced by mild fat depletion, mild muscle depletion, percent weight loss.  Ongoing, being addressed via initiation of enteral nutrition on 08/14/21  GOAL:  Patient will meet greater than or equal to 90% of their needs  Progressing with initiation of enteral nutrition on 08/14/21  MONITOR:   Diet advancement, Labs, Weight trends, TF tolerance, I & O's  REASON FOR ASSESSMENT:   Consult, Ventilator Enteral/tube feeding initiation and management  ASSESSMENT:   60 year old male with PMH of tobacco abuse, EtOH abuse, HTN, COPD/asthma, treated tuberculosis ([place], [place]) with residual right upper lobe scar/bullous and scattered nodular disease. Pt admitted with acute respiratory failure in the setting of new onset seizures and suspect ETOH withdrawal.  9/07 - extubated  Cardiology consulted for evaluation of newly diagnosed CHF and bacteremia. Noted plan for TEE tomorrow.  Pt received tube feeds while intubated (started on 9/06 then ended on 9/07 after pt was extubated). No documentation of pt with difficulty tolerating tube feeds.  SLP not able to evaluate pt for diet  advancement today due to lethargy. Discussed with CCM. Order in place for Cortrak to be placed tomorrow, 08/14/21, after TEE (which is scheduled for 1000) by Cortrak team. RD to order tube feeds to start tomorrow after TEE.  Admit weight: 75 kg Current weight: 72.9 kg  Medications reviewed and include: folic acid, SSI q 4 hours, liquid MVI, IV thiamine, IV abx  Labs reviewed. CBG's: 85-122 x 24 hours  UOP: 960 ml x 24 hours I/O's: +2.3 L since admit  Diet Order:   Diet Order             Diet NPO time specified  Diet effective midnight           Diet NPO time specified  Diet effective now                   EDUCATION NEEDS:   Not appropriate for education at this time  Skin:  Skin Assessment: Reviewed RN Assessment  Last BM:  no documented BM  Height:   Ht Readings from Last 1 Encounters:  08/10/21 6\' 1"  (1.854 m)    Weight:   Wt Readings from Last 1 Encounters:  08/10/21 72.9 kg    Ideal Body Weight:  83.6 kg  BMI:  Body mass index is 21.2 kg/m.  Estimated Nutritional Needs:   Kcal:  1900-2100  Protein:  95-115 grams  Fluid:  > 1.8 L    10/10/21, MS, RD, LDN Inpatient Clinical Dietitian Please see AMiON for contact information.

## 2021-08-13 NOTE — Progress Notes (Signed)
Progress Note  Patient Name: Frederick [name] Date of Encounter: 08/13/2021  [facility] Cardiologist: None   Subjective   Remains confused, on scheduled CIWA protocol. Unable to take any oral meds presently.   Inpatient Medications    Scheduled Meds:  carvedilol  3.125 mg Per Tube BID WC   chlorhexidine gluconate (MEDLINE KIT)  15 mL Mouth Rinse BID   Chlorhexidine Gluconate Cloth  6 each Topical Daily   enoxaparin (LOVENOX) injection  40 mg Subcutaneous Q24H   feeding supplement (PROSource TF)  45 mL Per Tube BID   folic acid  1 mg Per Tube Daily   insulin aspart  0-9 Units Subcutaneous Q4H   multivitamin  15 mL Per Tube Daily   thiamine injection  100 mg Intravenous Daily   Continuous Infusions:  ampicillin-sulbactam (UNASYN) IV 3 g (08/13/21 0610)   feeding supplement (VITAL AF 1.2 CAL) Stopped (08/12/21 0900)   levETIRAcetam Stopped (08/12/21 2341)   PRN Meds: docusate, ipratropium-albuterol, labetalol, LORazepam **OR** LORazepam, ondansetron (ZOFRAN) IV, polyethylene glycol   Vital Signs    Vitals:   08/13/21 0700 08/13/21 0800 08/13/21 0816 08/13/21 0830  BP: (!) 147/91 (!) 176/86 (!) 172/125 (!) 141/87  Pulse: (!) 104  (!) 111   Resp: 18 (!) 35 (!) 35 (!) 23  Temp:   98.8 F (37.1 C)   TempSrc:   Axillary   SpO2: 100% 97% 96%   Weight:      Height:        Intake/Output Summary (Last 24 hours) at 08/13/2021 0916 Last data filed at 08/13/2021 0600 Gross per 24 hour  Intake 826.57 ml  Output 900 ml  Net -73.43 ml   Last 3 Weights 08/10/2021 08/10/2021 05/29/2021  Weight (lbs) 160 lb 11.5 oz 165 lb 5.5 oz 162 lb 8 oz  Weight (kg) 72.9 kg 75 kg 73.71 kg      Telemetry    ST rates 100s-120s at times (HR stable while resting) - Personally Reviewed  ECG    No new tracing  Physical Exam   GEN: No acute distress, confused. Calm and resting.  Neck: No JVD Cardiac: tachy RR, no murmurs, rubs, or gallops.  Respiratory: Clear to auscultation  bilaterally. GI: Soft, nontender, non-distended  MS: No edema; No deformity. Neuro:  Nonfocal  Psych: confused, unable to answer questions but calm at present  Labs    High Sensitivity Troponin:  No results for input(s): TROPONINIHS in the last 720 hours.    Chemistry Recent Labs  Lab 08/10/21 1010 08/10/21 1244 08/11/21 0225 08/11/21 0838 08/11/21 1439 08/12/21 0453 08/13/21 0347  NA 136   < > 134*  --   --  135 135  K 2.5*   < > 4.1  --   --  3.2* 3.8  CL 115*  --  97*  --   --  101 99  CO2 16*  --  27  --   --  25 27  GLUCOSE 78   < > 138*   < > 98 117* 96  BUN <5*  --  5*  --   --  10 7*  CREATININE 0.46*  --  0.86  --   --  0.93 0.82  CALCIUM 5.2*  --  8.8*  --   --  8.4* 8.8*  PROT 3.9*  --   --   --   --   --   --   ALBUMIN 1.3*  --   --   --   --   --   --  AST 30  --   --   --   --   --   --   ALT 21  --   --   --   --   --   --   ALKPHOS 35*  --   --   --   --   --   --   BILITOT 0.6  --   --   --   --   --   --   GFRNONAA >60  --  >60  --   --  >60 >60  ANIONGAP 5  --  10  --   --  9 9   < > = values in this interval not displayed.     Hematology Recent Labs  Lab 08/10/21 1010 08/10/21 1244 08/11/21 0447  WBC 4.2  --  11.1*  RBC 2.39*  --  4.44  HGB 7.5* 14.3 14.2  HCT 23.9* 42.0 42.5  MCV 100.0  --  95.7  MCH 31.4  --  32.0  MCHC 31.4  --  33.4  RDW 15.3  --  15.4  PLT 113*  --  161    BNP Recent Labs  Lab 08/10/21 1537  BNP 401.9*     DDimer No results for input(s): DDIMER in the last 168 hours.   Radiology    Overnight EEG with video  Result Date: 08/11/2021 Lora Havens, MD     08/12/2021 10:16 AM Patient Name: Frederick [name] MRN: [id number] Epilepsy Attending: Lora Havens Referring Physician/Provider: Dr Kerney Elbe Duration: 08/10/2021 1330 to 08/11/2021 1330  Patient history: 60 y.o. male with a past medical hypertension and EtOH dependence BIB EMS after his wife complained of patient having a stroke.  EMS said that when they  arrived he was actively seizing, tonic clonic in nature.  Given 5 mg Versed IM which stopped his seizure.  BG reportedly 70.  EEG to evaluate for status epilepticus.  Level of alertness:  lethargic  AEDs during EEG study: Versed, LEV  Technical aspects: This EEG study was done with scalp electrodes positioned according to the 10-20 International system of electrode placement. Electrical activity was acquired at a sampling rate of _0  and reviewed with a high frequency filter of _1  and a low frequency filter of _2 . EEG data were recorded continuously and digitally stored.  Description: EEG showed continuous generalized 2 to 3 Hz delta slowing with overriding 15 to 18 Hz beta activity. Hyperventilation and photic stimulation were not performed.    ABNORMALITY - Continuous slow, generalized  IMPRESSION: This study is suggestive of severe diffuse encephalopathy, nonspecific etiology but likely related to sedation. No seizures or epileptiform discharges were seen throughout the recording.  Lora Havens    Cardiac Studies   Echo: 08/10/21  IMPRESSIONS     1. Left ventricular ejection fraction, by estimation, is 25 to 30%. The  left ventricle has severely decreased function. The left ventricle  demonstrates regional wall motion abnormalities. Unusual pattern with the  mid LV wall segments appearing akinetic  while the apex and the basal segments contract. Possibly a mid-wall  Takotsubo variant. Left ventricular diastolic function could not be  evaluated.   2. Right ventricular systolic function is mildly reduced. The right  ventricular size is normal.   3. The mitral valve is normal in structure. No evidence of mitral valve  regurgitation. No evidence of mitral stenosis.   4. The aortic valve was not well visualized. Aortic valve regurgitation  is not visualized. No aortic stenosis is present.   5. Technically difficult study with very limited images.   FINDINGS   Left Ventricle: Left  ventricular ejection fraction, by estimation, is 25  to 30%. The left ventricle has severely decreased function. The left  ventricle demonstrates regional wall motion abnormalities. The left  ventricular internal cavity size was normal   in size. There is no left ventricular hypertrophy. Left ventricular  diastolic function could not be evaluated.   Right Ventricle: The right ventricular size is normal. No increase in  right ventricular wall thickness. Right ventricular systolic function is  mildly reduced.   Left Atrium: Left atrial size was normal in size.   Right Atrium: Right atrial size was normal in size.   Pericardium: There is no evidence of pericardial effusion.   Mitral Valve: The mitral valve is normal in structure. No evidence of  mitral valve regurgitation. No evidence of mitral valve stenosis.   Tricuspid Valve: The tricuspid valve is normal in structure. Tricuspid  valve regurgitation is not demonstrated.   Aortic Valve: The aortic valve was not well visualized. Aortic valve  regurgitation is not visualized. No aortic stenosis is present.   Pulmonic Valve: The pulmonic valve was not well visualized. Pulmonic valve  regurgitation is not visualized.   Aorta: The aortic root was not well visualized.   Venous: The inferior vena cava was not well visualized.   IAS/Shunts: No atrial level shunt detected by color flow Doppler.   Patient Profile     60 y.o. male with a history COPD/asthma, tuberculosis in 2001 (treated by [facility]) with recurrence in 2016 (treated by [facility]), hypertension, and alcohol abuse who is being seen for the evaluation of CHF at the request of Dr. Carlis Abbott.  Assessment & Plan    New Onset Systolic CHF: Patient admitted with seizures (suspected to be due to alcohol withdrawal) and found to have a newly reduced EF. BNP 401.9. Chest x-ray on 08/11/2021 showed stable right basilar and apical scarring but no  edema noted. -- Echo showed LVEF of 25 to 30% with akinesis of mid LV wall segments while the apex and basal segments contract.  Possibly a mid wall Takotsubo variant.  RV normal in size with mildly reduced systolic function. -- remains volume stable on exam, therefore will hold on diuresis for now -- GDMT is limited at present with his confusion and inability to take oral medications. When able would recommend adding Coreg /Entresto -- Etiology likely secondary to alcohol vs stress induced cardiomyopathy from seizures (suspect alcohol). Per wife patient drinks >80 ounces of beer daily and has recently started adding liquor to that. Cessation will be incredibly important.   Sinus Tachycardia: Rates mostly in the low 100 range. Appropriately HR is elevated and agitated  -- Suspect secondary to alcohol withdrawal.   Bacteremia: Plan is for TEE on Friday 08/14/2021.  -- consent obtained by Sande Rives, PA yesterday, noted in the chart -- Otherwise, management per primary team.   Otherwise, per primary team: -- Acute hypoxic respiratory failure requiring intubation (extubated 08/12/2021) -- Seizure in the setting of likely ETOH withdrawal: On Keppra and Fosphenytoin -- Lactic acidosis: resolved -- Type 2 diabetes mellitus -- Alcohol abuse  For questions or updates, please contact [place] Please consult [email or web address] for contact info under        Signed, Reino Bellis, NP  08/13/2021, 9:16 AM

## 2021-08-14 ENCOUNTER — Inpatient Hospital Stay (HOSPITAL_COMMUNITY): Payer: Medicare Other | Admitting: Certified Registered"

## 2021-08-14 ENCOUNTER — Encounter (HOSPITAL_COMMUNITY): Payer: Self-pay | Admitting: Emergency Medicine

## 2021-08-14 ENCOUNTER — Encounter (HOSPITAL_COMMUNITY)
Admission: EM | Disposition: A | Discharge status: Skilled Nursing Facility | Payer: Self-pay | Source: Home / Self Care | Attending: Critical Care Medicine

## 2021-08-14 ENCOUNTER — Inpatient Hospital Stay (HOSPITAL_COMMUNITY)
Admit: 2021-08-14 | Discharge: 2021-08-14 | Disposition: A | Discharge status: Home / Self Care | Payer: Medicare Other | Attending: Student | Admitting: Student

## 2021-08-14 ENCOUNTER — Inpatient Hospital Stay (HOSPITAL_COMMUNITY): Payer: Medicare Other

## 2021-08-14 ENCOUNTER — Other Ambulatory Visit (HOSPITAL_COMMUNITY): Payer: Self-pay

## 2021-08-14 DIAGNOSIS — R7881 Bacteremia: Secondary | ICD-10-CM | POA: Diagnosis not present

## 2021-08-14 DIAGNOSIS — I10 Essential (primary) hypertension: Secondary | ICD-10-CM | POA: Diagnosis not present

## 2021-08-14 DIAGNOSIS — B951 Streptococcus, group B, as the cause of diseases classified elsewhere: Secondary | ICD-10-CM | POA: Diagnosis not present

## 2021-08-14 DIAGNOSIS — I5021 Acute systolic (congestive) heart failure: Secondary | ICD-10-CM | POA: Diagnosis not present

## 2021-08-14 DIAGNOSIS — Q211 Atrial septal defect: Secondary | ICD-10-CM

## 2021-08-14 DIAGNOSIS — R569 Unspecified convulsions: Secondary | ICD-10-CM | POA: Diagnosis not present

## 2021-08-14 HISTORY — PX: TEE WITHOUT CARDIOVERSION: SHX5443

## 2021-08-14 LAB — CBC
HCT: 37 % — ABNORMAL LOW (ref 39.0–52.0)
Hemoglobin: 12.3 g/dL — ABNORMAL LOW (ref 13.0–17.0)
MCH: 31.1 pg (ref 26.0–34.0)
MCHC: 33.2 g/dL (ref 30.0–36.0)
MCV: 93.7 fL (ref 80.0–100.0)
Platelets: 196 10*3/uL (ref 150–400)
RBC: 3.95 MIL/uL — ABNORMAL LOW (ref 4.22–5.81)
RDW: 14.6 % (ref 11.5–15.5)
WBC: 8.3 10*3/uL (ref 4.0–10.5)
nRBC: 0 % (ref 0.0–0.2)

## 2021-08-14 LAB — POCT I-STAT 7, (LYTES, BLD GAS, ICA,H+H)
Acid-Base Excess: 5 mmol/L — ABNORMAL HIGH (ref 0.0–2.0)
Bicarbonate: 27.4 mmol/L (ref 20.0–28.0)
Calcium, Ion: 1.22 mmol/L (ref 1.15–1.40)
HCT: 38 % — ABNORMAL LOW (ref 39.0–52.0)
Hemoglobin: 12.9 g/dL — ABNORMAL LOW (ref 13.0–17.0)
O2 Saturation: 99 %
Patient temperature: 99.5
Potassium: 3.2 mmol/L — ABNORMAL LOW (ref 3.5–5.1)
Sodium: 136 mmol/L (ref 135–145)
TCO2: 28 mmol/L (ref 22–32)
pCO2 arterial: 32.9 mmHg (ref 32.0–48.0)
pH, Arterial: 7.53 — ABNORMAL HIGH (ref 7.350–7.450)
pO2, Arterial: 127 mmHg — ABNORMAL HIGH (ref 83.0–108.0)

## 2021-08-14 LAB — BASIC METABOLIC PANEL
Anion gap: 8 (ref 5–15)
BUN: 9 mg/dL (ref 8–23)
CO2: 29 mmol/L (ref 22–32)
Calcium: 8.8 mg/dL — ABNORMAL LOW (ref 8.9–10.3)
Chloride: 96 mmol/L — ABNORMAL LOW (ref 98–111)
Creatinine, Ser: 0.77 mg/dL (ref 0.61–1.24)
GFR, Estimated: >60 mL/min (ref >60)
Glucose, Bld: 106 mg/dL — ABNORMAL HIGH (ref 70–99)
Potassium: 3.5 mmol/L (ref 3.5–5.1)
Sodium: 133 mmol/L — ABNORMAL LOW (ref 135–145)

## 2021-08-14 LAB — CULTURE, BLOOD (ROUTINE X 2)

## 2021-08-14 LAB — GLUCOSE, CAPILLARY
Glucose-Capillary: 125 mg/dL — ABNORMAL HIGH (ref 70–99)
Glucose-Capillary: 113 mg/dL — ABNORMAL HIGH (ref 70–99)
Glucose-Capillary: 106 mg/dL — ABNORMAL HIGH (ref 70–99)
Glucose-Capillary: 95 mg/dL (ref 70–99)
Glucose-Capillary: 110 mg/dL — ABNORMAL HIGH (ref 70–99)
Glucose-Capillary: 105 mg/dL — ABNORMAL HIGH (ref 70–99)

## 2021-08-14 LAB — MAGNESIUM
Magnesium: 1.6 mg/dL — ABNORMAL LOW (ref 1.7–2.4)
Magnesium: 2.1 mg/dL (ref 1.7–2.4)

## 2021-08-14 LAB — PHOSPHORUS
Phosphorus: 3 mg/dL (ref 2.5–4.6)
Phosphorus: 3.4 mg/dL (ref 2.5–4.6)

## 2021-08-14 SURGERY — ECHOCARDIOGRAM, TRANSESOPHAGEAL
Anesthesia: Monitor Anesthesia Care

## 2021-08-14 MED ORDER — MAGNESIUM SULFATE 2 GM/50ML IV SOLN
2.0000 g | Freq: Once | INTRAVENOUS | Status: AC
  Administered 2021-08-14: 2 g via INTRAVENOUS
  Filled 2021-08-14: qty 50

## 2021-08-14 MED ORDER — PROPOFOL 500 MG/50ML IV EMUL
INTRAVENOUS | Status: DC | PRN
  Administered 2021-08-14: 200 ug/kg/min via INTRAVENOUS

## 2021-08-14 MED ORDER — SODIUM CHLORIDE 0.9 % IV SOLN: INTRAVENOUS | Status: DC

## 2021-08-14 NOTE — Progress Notes (Signed)
NAME:  FRANCISO DIERKS, MRN:  [id number], DOB:  1961/08/16, LOS: 4 ADMISSION DATE:  08/10/2021, CONSULTATION DATE:  08/10/2022 REFERRING MD:  , CHIEF COMPLAINT:  Seizure with airway compromise  History of Present Illness:  Frederick [name] is an 60 y.o. male  current every day smoker , chronic heavy beer drinker (significantly > 80 oz per day per wife) with more recent escalation of intake to include hard liquor, but with decreased intake yesterday relative to his baseline. (Drank "only" two 40 oz beers per wife) presenting from home with a 1 week history of recurrent focal seizure activity based on wife's description. She had called EMS for what she felt was a stroke. On EMS arrival, he was actively seizing with GTC seizure and was administered 5 mg IM Versed which stopped the seizure. CBG was 70 in the field per EMS,  without cessation of seizure activity. O2 was originally 80% on room air however he went up to 93 on nasal cannula. In the ED he received 2 mg IV Ativan and 3000 mg IV Keppra, also without cessation.   Pt decompensated and was intubated for airway protection in setting of seizures in the ED after second dose of Ativan.   In the ED Head imaging was negative for acute intracranial abnormalities. 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  Of note patient has a history of TB which was diagnosed in 2001. He was treated at that time by the [facility] x 6 months. Pt had recurrence in 2016 , and was treated with  isoniazid, rifampin, ethambutol, pyrazinamide and B6 by the [facility].  Labs reviewed : K 2.5, Chloride 115, CO2 16, Creatinine 0.47,Calcium 5.2, Albumin 1.3, Alk Phos 35 HGB 7.5, WBC 4.2, platelets of 113 T Max of 98.8  CXR 9/5>>  Extensive chronic lung disease changes with peribronchial thickening, chronic nodularity and  chronic interstitial disease.   Chronic scarring and volume loss in RIGHT upper lobe with mediastinal shift to RIGHT  Pertinent  Medical History   Past Medical History:  Diagnosis Date   Arthritis    Asthma    Bilateral lower extremity edema 05/28/2021   propping feet up   Cough 05/28/2021   with clear sputum   Dyspnea    Dyspnea 05/28/2021   with exertion   EtOH dependence (HCC)    drinks 2-3 of 40ounes a day   Fibula fracture    Dr Mardelle Matte 02-2011   Finger osteomyelitis, right ([place]) 05/26/2021   right index finger   Hernia    surgery 01-17-13   Hypertension    not taking bp meds since may 2022   Tuberculosis    couple of yrs ago took tx at [facility] per pt on 05-28-2021     Significant Hospital Events: Including procedures, antibiotic start and stop dates in addition to other pertinent events   Admission 08/10/2022 Started on unasyn for presumed aspiration pneumonia 9/6; overnight blood cultures from admission returned positive.  Extubated 9/7  Interim History / Subjective:  Drowsy this morning for me however intermittently follows commands for RN overnight. Protecting airway.  Objective   Blood pressure (!) 169/96, pulse (!) 101, temperature 98.9 F (37.2 C), temperature source Axillary, resp. rate (!) 34, height '6\' 1"'  (1.854 m), weight 72.9 kg, SpO2 100 %.  Intake/Output Summary (Last 24 hours) at 08/14/2021 0703 Last data filed at 08/14/2021 0600 Gross per 24 hour  Intake 839.98 ml  Output 1265 ml  Net -425.02 ml   Filed Weights   08/10/21 1000 08/10/21 1627  Weight: 75 kg 72.9 kg   Physical Exam: General: Chronically ill-appearing, drowsy and snoring, no acute distress HENT: [place], AT, OP clear, MMM Eyes: EOMI, PERRL, no scleral icterus Respiratory: Clear to auscultation bilaterally.  No crackles, wheezing or rales Cardiovascular: RRR, -M/R/G, no JVD GI: BS+, soft, nontender Extremities:-Edema,-tenderness Neuro: Sleeping,  withdraws to noxious stimuli x 4  Mg 1.6  9/5 Blood cultures in 2 of 4 bottles - Strep anginosis.  9/6 Resp culture - normal flora   cEEG> moderate diffuse encephalopathy  Resolved Hospital Problem list   hypokalemia  Assessment & Plan:  Acute hypoxic respiratory failure in setting of new onset seizures, suspected to be due to ETOH withdrawal. 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 No ischemia evaluation needed.  HTN -labetalol PRN -will start coreg and Entresto once taking PO/ enteral meds  Seizures in setting of likely ETOH withdrawal No acute intracranial abnormalities on CT -appreciate Neurology's assistance; con't keppra, which will be weaned off as an outpatient. -Maintain seizure precautions  -Needs brain MRI without contrast per neuro once able to reliably remain still.  DM, no hyperglycemia while off TF. Fingersticks not correlating with serum samples. -SSI PRN -goal BG 140-180 -fingersticks unreliable  Moderate protein energy malnutrition due to ETOH abuse -needs Cortrak to resume TF if unable to pass swallow evaluation today. -vitamins; switch thiamine to IV for now  Deconditioning -PT, OT, SLP   Best Practice (right click and "Reselect all SmartList Selections" daily)    Diet/type: NPO- Planning for cortrak during or after TEE DVT prophylaxis: LMWH GI prophylaxis: N/A Lines: N/A Foley:  N/A Code Status:  full code Last date of multidisciplinary goals of care discussion [ 9/8- wife Sandra] Dispo: Remain in ICU  Labs   CBC: Recent Labs  Lab 08/10/21 1010 08/10/21 1244 08/11/21 0447  WBC 4.2  --  11.1*  NEUTROABS 3.0  --   --   HGB 7.5* 14.3 14.2  HCT 23.9* 42.0 42.5  MCV 100.0  --  95.7  PLT 113*  --  920    Basic Metabolic Panel: Recent Labs  Lab 08/10/21 1010 08/10/21 1244 08/10/21 1537 08/11/21 0225 08/11/21 0838 08/11/21 1439 08/11/21 2030 08/12/21 0453 08/13/21 0347 08/14/21 0458  NA 136 133*  --  134*  --   --   --  135 135  --   K 2.5* 3.3*  --  4.1  --   --   --  3.2* 3.8  --   CL 115*  --   --  97*  --   --   --  101 99  --   CO2 16*  --   --  27  --   --   --  25 27  --   GLUCOSE 78  --    < > 138* 164* 98  --  117* 96  --   BUN <5*  --   --  5*  --   --   --  10 7*  --   CREATININE 0.46*  --   --  0.86  --   --   --  0.93 0.82  --   CALCIUM 5.2*  --   --  8.8*  --   --   --  8.4* 8.8*  --   MG  --   --    < > 1.3*  --   --  1.9 1.8 1.7 1.6*  PHOS  --   --   --  2.3*  --   --   --  3.9 3.5 3.0   < > = values in this interval not displayed.   GFR: Estimated Creatinine Clearance: 97.5 mL/min (by C-G formula based on SCr of 0.82 mg/dL). Recent Labs  Lab 08/10/21 1010 08/10/21 1537 08/11/21 0225 08/11/21 0447 08/11/21 0820  PROCALCITON  --  5.20  --   --   --   WBC 4.2  --   --  11.1*  --   LATICACIDVEN  --  3.3* 7.5*  --  2.6*     The patient is critically ill with altered mental status and respiratory failure with high deterioration. Also requires high complexity decision making for assessment and support, frequent evaluation and titration of therapies, application of advanced monitoring technologies and extensive interpretation of multiple databases.  Independent Critical Care Time: 35 Minutes.   Rodman Pickle, M.D. [facility] Pulmonary/Critical Care Medicine 08/14/2021 8:04 AM   Please see Amion for pager number to reach on-call Pulmonary and Critical Care Team.

## 2021-08-14 NOTE — Progress Notes (Signed)
Progress Note  Patient Name: Frederick [name] Date of Encounter: 08/14/2021  [facility] Cardiologist: None   Subjective   Remains confused, lethargic on scheduled CIWA protocol. Hand restraints. Unable to take any oral meds presently.   Inpatient Medications    Scheduled Meds:  carvedilol  3.125 mg Per Tube BID WC   chlorhexidine gluconate (MEDLINE KIT)  15 mL Mouth Rinse BID   Chlorhexidine Gluconate Cloth  6 each Topical Daily   enoxaparin (LOVENOX) injection  40 mg Subcutaneous Q24H   feeding supplement (PROSource TF)  45 mL Per Tube BID   folic acid  1 mg Per Tube Daily   insulin aspart  0-9 Units Subcutaneous Q4H   multivitamin with minerals  1 tablet Per Tube Daily   thiamine injection  100 mg Intravenous Daily   Continuous Infusions:  sodium chloride     cefTRIAXone (ROCEPHIN)  IV Stopped (08/13/21 1221)   dextrose 50 mL/hr at 08/14/21 0900   feeding supplement (OSMOLITE 1.5 CAL)     levETIRAcetam 1,000 mg (08/14/21 1053)   magnesium sulfate bolus IVPB     PRN Meds: docusate, ipratropium-albuterol, labetalol, LORazepam **OR** LORazepam, ondansetron (ZOFRAN) IV, polyethylene glycol   Vital Signs    Vitals:   08/14/21 0800 08/14/21 0900 08/14/21 0930 08/14/21 1200  BP: (!) 148/106 (!) 171/93 (!) 149/99   Pulse: 96 97 95   Resp: (!) 33 (!) 32    Temp: 97.9 F (36.6 C)   97.8 F (36.6 C)  TempSrc: Axillary   Axillary  SpO2: 98% 99%    Weight:      Height:        Intake/Output Summary (Last 24 hours) at 08/14/2021 1231 Last data filed at 08/14/2021 0900 Gross per 24 hour  Intake 889.79 ml  Output 1000 ml  Net -110.21 ml    Last 3 Weights 08/10/2021 08/10/2021 05/29/2021  Weight (lbs) 160 lb 11.5 oz 165 lb 5.5 oz 162 lb 8 oz  Weight (kg) 72.9 kg 75 kg 73.71 kg      Telemetry    ST rates 100s-120s at times (HR stable while resting) - Personally Reviewed  ECG    No new tracing  Physical Exam   GEN: No acute distress, lethargic. Calm and  sleeping Neck: No JVD Cardiac: tachy RR, no murmurs, rubs, or gallops.  Respiratory: Clear to auscultation bilaterally. GI: Soft, nontender, non-distended  MS: No edema; No deformity. Neuro:  Nonfocal  Psych: confused, unable to answer questions but calm at present  Labs    High Sensitivity Troponin:  No results for input(s): TROPONINIHS in the last 720 hours.    Chemistry Recent Labs  Lab 08/10/21 1010 08/10/21 1244 08/11/21 0225 08/11/21 0838 08/11/21 1439 08/12/21 0453 08/13/21 0347  NA 136   < > 134*  --   --  135 135  K 2.5*   < > 4.1  --   --  3.2* 3.8  CL 115*  --  97*  --   --  101 99  CO2 16*  --  27  --   --  25 27  GLUCOSE 78   < > 138*   < > 98 117* 96  BUN <5*  --  5*  --   --  10 7*  CREATININE 0.46*  --  0.86  --   --  0.93 0.82  CALCIUM 5.2*  --  8.8*  --   --  8.4* 8.8*  PROT 3.9*  --   --   --   --   --   --  ALBUMIN 1.3*  --   --   --   --   --   --   AST 30  --   --   --   --   --   --   ALT 21  --   --   --   --   --   --   ALKPHOS 35*  --   --   --   --   --   --   BILITOT 0.6  --   --   --   --   --   --   GFRNONAA >60  --  >60  --   --  >60 >60  ANIONGAP 5  --  10  --   --  9 9   < > = values in this interval not displayed.      Hematology Recent Labs  Lab 08/10/21 1010 08/10/21 1244 08/11/21 0447  WBC 4.2  --  11.1*  RBC 2.39*  --  4.44  HGB 7.5* 14.3 14.2  HCT 23.9* 42.0 42.5  MCV 100.0  --  95.7  MCH 31.4  --  32.0  MCHC 31.4  --  33.4  RDW 15.3  --  15.4  PLT 113*  --  161     BNP Recent Labs  Lab 08/10/21 1537  BNP 401.9*      DDimer No results for input(s): DDIMER in the last 168 hours.   Radiology    No results found.  Cardiac Studies   Echo: 08/10/21  IMPRESSIONS     1. Left ventricular ejection fraction, by estimation, is 25 to 30%. The  left ventricle has severely decreased function. The left ventricle  demonstrates regional wall motion abnormalities. Unusual pattern with the  mid LV wall segments  appearing akinetic  while the apex and the basal segments contract. Possibly a mid-wall  Takotsubo variant. Left ventricular diastolic function could not be  evaluated.   2. Right ventricular systolic function is mildly reduced. The right  ventricular size is normal.   3. The mitral valve is normal in structure. No evidence of mitral valve  regurgitation. No evidence of mitral stenosis.   4. The aortic valve was not well visualized. Aortic valve regurgitation  is not visualized. No aortic stenosis is present.   5. Technically difficult study with very limited images.   FINDINGS   Left Ventricle: Left ventricular ejection fraction, by estimation, is 25  to 30%. The left ventricle has severely decreased function. The left  ventricle demonstrates regional wall motion abnormalities. The left  ventricular internal cavity size was normal   in size. There is no left ventricular hypertrophy. Left ventricular  diastolic function could not be evaluated.   Right Ventricle: The right ventricular size is normal. No increase in  right ventricular wall thickness. Right ventricular systolic function is  mildly reduced.   Left Atrium: Left atrial size was normal in size.   Right Atrium: Right atrial size was normal in size.   Pericardium: There is no evidence of pericardial effusion.   Mitral Valve: The mitral valve is normal in structure. No evidence of  mitral valve regurgitation. No evidence of mitral valve stenosis.   Tricuspid Valve: The tricuspid valve is normal in structure. Tricuspid  valve regurgitation is not demonstrated.   Aortic Valve: The aortic valve was not well visualized. Aortic valve  regurgitation is not visualized. No aortic stenosis is present.   Pulmonic Valve: The pulmonic valve was  not well visualized. Pulmonic valve  regurgitation is not visualized.   Aorta: The aortic root was not well visualized.   Venous: The inferior vena cava was not well visualized.    IAS/Shunts: No atrial level shunt detected by color flow Doppler.   Patient Profile     60 y.o. male with a history COPD/asthma, tuberculosis in 2001 (treated by [facility]) with recurrence in 2016 (treated by [facility]), hypertension, and alcohol abuse who is being seen for the evaluation of CHF at the request of Dr. Carlis Abbott.  Assessment & Plan    New Onset Systolic CHF: Patient admitted with seizures (suspected to be due to alcohol withdrawal) and found to have a newly reduced EF. BNP 401.9. Chest x-ray on 08/11/2021 showed stable right basilar and apical scarring but no edema noted. -- Echo showed LVEF of 25 to 30%. 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 Doubt Takotsubo without chest pain or dynamic Ecg changes.  -- remains volume stable on exam, therefore will hold on diuresis for now -- GDMT is limited at present with his confusion and inability to take oral medications. When able would recommend adding Coreg /Entresto -- Etiology likely secondary to alcohol  Per wife patient drinks >80 ounces of beer daily and has recently started adding liquor to that. Cessation will be incredibly important. -- unable to perform swallowing evaluation due to cooperation.  -- plan Cortrak placement later today. Once in place can initiate heart failure therapy   Sinus Tachycardia: Rates mostly in the low 100 range. Appropriately HR is elevated and agitated  -- Suspect secondary to alcohol withdrawal.   Bacteremia: Plan is for TEE today -- consent obtained by Sande Rives, PA noted in the chart -- Otherwise, management per primary team.   Otherwise, per primary team: -- Acute hypoxic respiratory failure requiring intubation (extubated 08/12/2021) -- Seizure in the setting of likely ETOH withdrawal: On Keppra and Fosphenytoin -- Lactic acidosis: resolved -- Type 2 diabetes mellitus -- Alcohol abuse  For questions or updates, please contact [facility] Please  consult [email or web address] for contact info under        Signed, Gyan Cambre [place], MD  08/14/2021, 12:31 PM

## 2021-08-14 NOTE — Progress Notes (Signed)
PT Cancellation Note  Patient Details Name: Frederick [name] MRN: [id number] DOB: Apr 24, 1961   Cancelled Treatment:    Reason Eval/Treat Not Completed: Patient at procedure or test/unavailable; patient down for MRI, then has TEE scheduled at noon. Will attempt another day.   Elray Mcgregor 08/14/2021, 9:39 AM Sheran Lawless, PT Acute Rehabilitation Services Pager:[phone] Office:[phone] 08/14/2021

## 2021-08-14 NOTE — Transfer of Care (Signed)
Immediate Anesthesia Transfer of Care Note  Patient: Frederick [name]  Procedure(s) Performed: TRANSESOPHAGEAL ECHOCARDIOGRAM (TEE)  Patient Location: Endoscopy Unit  Anesthesia Type:MAC  Level of Consciousness: sedated and responds to stimulation  Airway & Oxygen Therapy: Patient Spontanous Breathing and Patient connected to nasal cannula oxygen  Post-op Assessment: Report given to RN, Post -op Vital signs reviewed and stable and Patient moving all extremities  Post vital signs: Reviewed and stable  Last Vitals:  Vitals Value Taken Time  BP    Temp    Pulse    Resp    SpO2      Last Pain:  Vitals:   08/14/21 1250  TempSrc: Temporal         Complications: No notable events documented.

## 2021-08-14 NOTE — Procedures (Signed)
Cortrak  Person Inserting Tube:  Decklyn Hyder E, RD Tube Type:  Cortrak - 43 inches Tube Size:  10 Tube Location:  Left nare Initial Placement:  Stomach Secured by: Bridle Technique Used to Measure Tube Placement:  Marking at nare/corner of mouth Cortrak Secured At:  70 cm Cortrak Tube Team Note:  Consult received to place a Cortrak feeding tube.   X-ray is required, abdominal x-ray has been ordered by the Cortrak team. Please confirm tube placement before using the Cortrak tube.   If the tube becomes dislodged please keep the tube and contact the Cortrak team at [email or web address] ([email or web address]) for replacement.  If after hours and replacement cannot be delayed, place a NG tube and confirm placement with an abdominal x-ray.    Tamanna Whitson, MS, RD, LDN (she/her/hers) RD pager number and weekend/on-call pager number located in Amion.   

## 2021-08-14 NOTE — TOC Benefit Eligibility Note (Signed)
Patient Product/process development scientist completed.    The patient is currently admitted and upon discharge could be taking Entresto 24-26 mg.  The current 30 day co-pay is, $0.00.   The patient is currently admitted and upon discharge could be taking Jardiance 10 mg.  The current 30 day co-pay is, $0.00.   The patient is currently admitted and upon discharge could be taking Farxiga 10 mg.  The current 30 day co-pay is, $0.00.   The patient is insured through [employer] Medicare Part D/[place] Medicaid     Frederick [name], CPhT Pharmacy Patient Advocate Specialist [facility] Antimicrobial Stewardship Team Direct Number: [phone]  Fax: [phone]

## 2021-08-14 NOTE — Progress Notes (Signed)
OT Cancellation Note  Patient Details Name: [name] MRN: [id number] DOB: 1961-05-11   Cancelled Treatment:    Reason Eval/Treat Not Completed: Patient at procedure or test/ unavailable (MRI; has TEE scheduled at noon)  [facility] Elanora Quin, OT/L   Acute OT Clinical Specialist Acute Rehabilitation Services Pager [phone] Office [phone]  08/14/2021, 9:21 AM

## 2021-08-14 NOTE — Progress Notes (Signed)
SLP Cancellation Note  Patient Details Name: Frederick [name] MRN: [id number] DOB: [birth year]   Cancelled treatment:        Pt currently in TEE. This am,in MRI. Continue attempts for swallow and speech-language eval.    Royce Macadamia 08/14/2021, 1:30 PM

## 2021-08-14 NOTE — Anesthesia Preprocedure Evaluation (Addendum)
Anesthesia Evaluation  Patient identified by MRN, date of birth, ID bandGeneral Assessment Comment:Patient minimally responsive  Reviewed: Allergy & Precautions, NPO status , Patient's Chart, lab work & pertinent test results  Airway Mallampati: III  TM Distance: >3 FB Neck ROM: Full    Dental  (+) Chipped, Poor Dentition   Pulmonary shortness of breath and Long-Term Oxygen Therapy, asthma , Current Smoker and Patient abstained from smoking.,    Pulmonary exam normal breath sounds clear to auscultation       Cardiovascular hypertension, +CHF   Rhythm:Regular Rate:Tachycardia     Neuro/Psych Seizures -,  PSYCHIATRIC DISORDERS    GI/Hepatic negative GI ROS, (+)     substance abuse  ,   Endo/Other  negative endocrine ROS  Renal/GU negative Renal ROS     Musculoskeletal  (+) Arthritis ,   Abdominal   Peds  Hematology  (+) anemia ,   Anesthesia Other Findings BACTEREMIA  Reproductive/Obstetrics                            Anesthesia Physical Anesthesia Plan  ASA: 4  Anesthesia Plan: MAC   Post-op Pain Management:    Induction: Intravenous  PONV Risk Score and Plan: 1 and Propofol infusion and Treatment may vary due to age or medical condition  Airway Management Planned: Nasal Cannula  Additional Equipment:   Intra-op Plan:   Post-operative Plan:   Informed Consent: I have reviewed the patients History and Physical, chart, labs and discussed the procedure including the risks, benefits and alternatives for the proposed anesthesia with the patient or authorized representative who has indicated his/her understanding and acceptance.     Consent reviewed with POA  Plan Discussed with: CRNA  Anesthesia Plan Comments: (Anesthetic plan discussed with wife via telephone)        Anesthesia Quick Evaluation

## 2021-08-14 NOTE — CV Procedure (Signed)
TEE: Anesthesia: Propofol Patient obtunded prior to test Consent From wife via PA on 9/7  EF 45-50%  PFO with redundant atrial septum No SBE/vegetations Normal RV No effusion  No LAA thrombus  See full report in [place]  Charlton Haws MD [facility]

## 2021-08-15 ENCOUNTER — Encounter (HOSPITAL_COMMUNITY): Payer: Self-pay | Admitting: Cardiovascular Disease

## 2021-08-15 DIAGNOSIS — R7881 Bacteremia: Secondary | ICD-10-CM | POA: Diagnosis not present

## 2021-08-15 DIAGNOSIS — E440 Moderate protein-calorie malnutrition: Secondary | ICD-10-CM | POA: Diagnosis not present

## 2021-08-15 DIAGNOSIS — I5021 Acute systolic (congestive) heart failure: Secondary | ICD-10-CM | POA: Diagnosis not present

## 2021-08-15 DIAGNOSIS — R569 Unspecified convulsions: Secondary | ICD-10-CM | POA: Diagnosis not present

## 2021-08-15 LAB — CBC
HCT: 35.7 % — ABNORMAL LOW (ref 39.0–52.0)
Hemoglobin: 12.3 g/dL — ABNORMAL LOW (ref 13.0–17.0)
MCH: 31.4 pg (ref 26.0–34.0)
MCHC: 34.5 g/dL (ref 30.0–36.0)
MCV: 91.1 fL (ref 80.0–100.0)
Platelets: 208 10*3/uL (ref 150–400)
RBC: 3.92 MIL/uL — ABNORMAL LOW (ref 4.22–5.81)
RDW: 14.5 % (ref 11.5–15.5)
WBC: 6.7 10*3/uL (ref 4.0–10.5)
nRBC: 0 % (ref 0.0–0.2)

## 2021-08-15 LAB — BASIC METABOLIC PANEL
Anion gap: 9 (ref 5–15)
BUN: 11 mg/dL (ref 8–23)
CO2: 27 mmol/L (ref 22–32)
Calcium: 9 mg/dL (ref 8.9–10.3)
Chloride: 97 mmol/L — ABNORMAL LOW (ref 98–111)
Creatinine, Ser: 0.77 mg/dL (ref 0.61–1.24)
GFR, Estimated: >60 mL/min (ref >60)
Glucose, Bld: 132 mg/dL — ABNORMAL HIGH (ref 70–99)
Potassium: 3.5 mmol/L (ref 3.5–5.1)
Sodium: 133 mmol/L — ABNORMAL LOW (ref 135–145)

## 2021-08-15 LAB — GLUCOSE, CAPILLARY
Glucose-Capillary: 69 mg/dL — ABNORMAL LOW (ref 70–99)
Glucose-Capillary: 97 mg/dL (ref 70–99)
Glucose-Capillary: 106 mg/dL — ABNORMAL HIGH (ref 70–99)
Glucose-Capillary: 65 mg/dL — ABNORMAL LOW (ref 70–99)
Glucose-Capillary: 118 mg/dL — ABNORMAL HIGH (ref 70–99)
Glucose-Capillary: 120 mg/dL — ABNORMAL HIGH (ref 70–99)
Glucose-Capillary: 106 mg/dL — ABNORMAL HIGH (ref 70–99)
Glucose-Capillary: 114 mg/dL — ABNORMAL HIGH (ref 70–99)
Glucose-Capillary: 113 mg/dL — ABNORMAL HIGH (ref 70–99)

## 2021-08-15 LAB — PHOSPHORUS
Phosphorus: 3.9 mg/dL (ref 2.5–4.6)
Phosphorus: 3.6 mg/dL (ref 2.5–4.6)

## 2021-08-15 LAB — MAGNESIUM
Magnesium: 1.8 mg/dL (ref 1.7–2.4)
Magnesium: 1.9 mg/dL (ref 1.7–2.4)

## 2021-08-15 LAB — TRIGLYCERIDES: Triglycerides: 90 mg/dL (ref <150)

## 2021-08-15 MED ORDER — CARVEDILOL 6.25 MG PO TABS
6.2500 mg | ORAL_TABLET | Freq: Two times a day (BID) | ORAL | Status: DC
  Administered 2021-08-15 – 2021-08-23 (×17): 6.25 mg
  Filled 2021-08-15: qty 1
  Filled 2021-08-15 (×2): qty 2
  Filled 2021-08-15 (×6): qty 1
  Filled 2021-08-15: qty 2
  Filled 2021-08-15: qty 1
  Filled 2021-08-15: qty 2
  Filled 2021-08-15 (×3): qty 1
  Filled 2021-08-15: qty 2
  Filled 2021-08-15: qty 1

## 2021-08-15 MED ORDER — DEXTROSE 10 % IV SOLN: INTRAVENOUS | Status: DC

## 2021-08-15 MED ORDER — GUAIFENESIN 100 MG/5ML PO SOLN
5.0000 mL | Freq: Four times a day (QID) | ORAL | Status: DC
Start: 2021-08-15 — End: 2021-08-16
  Administered 2021-08-15 – 2021-08-16 (×6): 100 mg via ORAL
  Filled 2021-08-15 (×6): qty 15

## 2021-08-15 MED ORDER — DEXTROSE 50 % IV SOLN: 12.5000 g | INTRAVENOUS | Status: AC

## 2021-08-15 MED ORDER — SACUBITRIL-VALSARTAN 24-26 MG PO TABS
1.0000 | ORAL_TABLET | Freq: Two times a day (BID) | ORAL | Status: DC
  Administered 2021-08-15 (×2): 1 via ORAL
  Filled 2021-08-15 (×4): qty 1

## 2021-08-15 MED ORDER — MAGNESIUM SULFATE 2 GM/50ML IV SOLN
2.0000 g | Freq: Once | INTRAVENOUS | Status: AC
  Administered 2021-08-15: 2 g via INTRAVENOUS
  Filled 2021-08-15: qty 50

## 2021-08-15 NOTE — Progress Notes (Signed)
Progress Note  Patient Name: Frederick [name] Date of Encounter: 08/15/2021  Primary Cardiologist:   None   Subjective   Somnolent.  However, he apparently was able to sit at the side of the bed with PT today.    Inpatient Medications    Scheduled Meds:  carvedilol  3.125 mg Per Tube BID WC   chlorhexidine gluconate (MEDLINE KIT)  15 mL Mouth Rinse BID   Chlorhexidine Gluconate Cloth  6 each Topical Daily   dextrose  12.5 g Intravenous STAT   enoxaparin (LOVENOX) injection  40 mg Subcutaneous Q24H   feeding supplement (PROSource TF)  45 mL Per Tube BID   folic acid  1 mg Per Tube Daily   guaiFENesin  5 mL Oral Q6H   insulin aspart  0-9 Units Subcutaneous Q4H   multivitamin with minerals  1 tablet Per Tube Daily   sacubitril-valsartan  1 tablet Oral BID   thiamine injection  100 mg Intravenous Daily   Continuous Infusions:  cefTRIAXone (ROCEPHIN)  IV Stopped (08/14/21 1726)   dextrose     feeding supplement (OSMOLITE 1.5 CAL) 1,000 mL (08/14/21 1710)   levETIRAcetam Stopped (08/15/21 0946)   PRN Meds: docusate, ipratropium-albuterol, labetalol, ondansetron (ZOFRAN) IV, polyethylene glycol   Vital Signs    Vitals:   08/15/21 0800 08/15/21 0816 08/15/21 0900 08/15/21 1000  BP: (!) 140/110 (!) 140/110 (!) 120/92 (!) 120/93  Pulse: 100 100 (!) 101 100  Resp: (!) 32  (!) 32 (!) 34  Temp: 99.3 F (37.4 C)     TempSrc: Axillary     SpO2: 100%  96% 92%  Weight:      Height:        Intake/Output Summary (Last 24 hours) at 08/15/2021 1055 Last data filed at 08/15/2021 1000 Gross per 24 hour  Intake 1599.55 ml  Output 700 ml  Net 899.55 ml   Filed Weights   08/10/21 1000 08/10/21 1627  Weight: 75 kg 72.9 kg    Telemetry    NSR, sinus tach - Personally Reviewed  ECG    NA - Personally Reviewed  Physical Exam   GEN: No acute distress.   Neck: No  JVD Cardiac: RRR, no murmurs, rubs, or gallops.  Respiratory:     Decreased breath sounds and transmitted  upper airway sounds GI: Soft, nontender, non-distended  MS:   Mild diffuse upper extremity edema non pitting.  Neuro:  Unable to assess Psych:     Unable to assess.   Labs    Chemistry Recent Labs  Lab 08/10/21 1010 08/10/21 1244 08/13/21 0347 08/14/21 1220 08/15/21 0431  NA 136   < > 135 133* 133*  K 2.5*   < > 3.8 3.5 3.5  CL 115*   < > 99 96* 97*  CO2 16*   < > '27 29 27  ' GLUCOSE 78   < > 96 106* 132*  BUN <5*   < > 7* 9 11  CREATININE 0.46*   < > 0.82 0.77 0.77  CALCIUM 5.2*   < > 8.8* 8.8* 9.0  PROT 3.9*  --   --   --   --   ALBUMIN 1.3*  --   --   --   --   AST 30  --   --   --   --   ALT 21  --   --   --   --   ALKPHOS 35*  --   --   --   --  BILITOT 0.6  --   --   --   --   GFRNONAA >60   < > >60 >60 >60  ANIONGAP 5   < > '9 8 9   ' < > = values in this interval not displayed.     Hematology Recent Labs  Lab 08/11/21 0447 08/11/21 0508 08/14/21 1220 08/15/21 0431  WBC 11.1*  --  8.3 6.7  RBC 4.44  --  3.95* 3.92*  HGB 14.2 12.9* 12.3* 12.3*  HCT 42.5 38.0* 37.0* 35.7*  MCV 95.7  --  93.7 91.1  MCH 32.0  --  31.1 31.4  MCHC 33.4  --  33.2 34.5  RDW 15.4  --  14.6 14.5  PLT 161  --  196 208    Cardiac EnzymesNo results for input(s): TROPONINI in the last 168 hours. No results for input(s): TROPIPOC in the last 168 hours.   BNP Recent Labs  Lab 08/10/21 1537  BNP 401.9*     DDimer No results for input(s): DDIMER in the last 168 hours.   Radiology    MR BRAIN WO CONTRAST  Result Date: 08/14/2021 CLINICAL DATA:  Seizure, abnormal neuro exam EXAM: MRI HEAD WITHOUT CONTRAST TECHNIQUE: Multiplanar, multiecho pulse sequences of the brain and surrounding structures were obtained without intravenous contrast. COMPARISON:  CT head 08/10/2021. FINDINGS: Moderate to severely motion limited exam.  Within this limitation: Brain: No acute infarction, hemorrhage, hydrocephalus, extra-axial collection or mass lesion. Moderate atrophy with ex vacuo ventricular  dilation. Mild for age scattered T2 hyperintensities in the white matter, nonspecific but compatible with chronic microvascular ischemic disease. Vascular: Major arterial flow voids are maintained skull base. Skull and upper cervical spine: Normal marrow signal. Sinuses/Orbits: Clear sinuses.  No acute orbital findings. Other: No sizable mastoid effusions. IMPRESSION: 1. Moderate to severely motion limited exam without obvious acute abnormality. 2. Moderate atrophy and mild chronic microvascular ischemic disease. Electronically Signed   By: Margaretha Sheffield M.D.   On: 08/14/2021 12:49   DG Abd Portable 1V  Result Date: 08/14/2021 CLINICAL DATA:  Feeding tube placement. EXAM: PORTABLE ABDOMEN - 1 VIEW COMPARISON:  August 10, 2021. FINDINGS: The bowel gas pattern is normal. Distal tip of feeding tube is seen in expected position of distal stomach. No radio-opaque calculi or other significant radiographic abnormality are seen. IMPRESSION: Distal tip of feeding tube is seen in expected position of distal stomach. Electronically Signed   By: Marijo Conception M.D.   On: 08/14/2021 16:09   ECHO TEE  Result Date: 08/14/2021    TRANSESOPHOGEAL ECHO REPORT   Patient Name:   Frederick [name] Date of Exam: 08/14/2021 Medical Rec #:  [id number]      Height:       73.0 in Accession #:    [id number]     Weight:       160.7 lb Date of Birth:  1961-07-04      BSA:          1.961 m Patient Age:    60 years       BP:           110/70 mmHg Patient Gender: M              HR:           115 bpm. Exam Location:  Inpatient Procedure: Transesophageal Echo Indications:    Bacteremia  History:        Patient has prior history of Echocardiogram examinations.  Signs/Symptoms:Bacteremia.  Sonographer:    Merrie Roof RDCS Referring Phys: [id number] Marty: The transesophogeal probe was passed without difficulty through the esophogus of the patient. Sedation performed by different physician. The patient developed  no complications during the procedure. IMPRESSIONS  1. Left ventricular ejection fraction, by estimation, is 50 to 55%. The left ventricle has low normal function.  2. Right ventricular systolic function is normal. The right ventricular size is normal.  3. Left atrial size was mildly dilated. No left atrial/left atrial appendage thrombus was detected.  4. The mitral valve is normal in structure. No evidence of mitral valve regurgitation.  5. The aortic valve is tricuspid. Aortic valve regurgitation is not visualized. No aortic stenosis is present.  6. Evidence of atrial level shunting detected by color flow Doppler. There is a small patent foramen ovale with predominantly left to right shunting across the atrial septum. FINDINGS  Left Ventricle: Left ventricular ejection fraction, by estimation, is 50 to 55%. The left ventricle has low normal function. The left ventricular internal cavity size was normal in size. Right Ventricle: The right ventricular size is normal. No increase in right ventricular wall thickness. Right ventricular systolic function is normal. Left Atrium: Left atrial size was mildly dilated. No left atrial/left atrial appendage thrombus was detected. Right Atrium: Right atrial size was normal in size. Pericardium: There is no evidence of pericardial effusion. Mitral Valve: The mitral valve is normal in structure. No evidence of mitral valve regurgitation. There is no evidence of mitral valve vegetation. Tricuspid Valve: The tricuspid valve is normal in structure. Tricuspid valve regurgitation is mild. There is no evidence of tricuspid valve vegetation. Aortic Valve: The aortic valve is tricuspid. Aortic valve regurgitation is not visualized. No aortic stenosis is present. There is no evidence of aortic valve vegetation. Pulmonic Valve: The pulmonic valve was normal in structure. Pulmonic valve regurgitation is trivial. There is no evidence of pulmonic valve vegetation. Aorta: The aortic root is  normal in size and structure. IAS/Shunts: Evidence of atrial level shunting detected by color flow Doppler. A small patent foramen ovale is detected with predominantly left to right shunting across the atrial septum. Jenkins Rouge MD Electronically signed by Jenkins Rouge MD Signature Date/Time: 08/14/2021/2:03:13 PM    Final     Cardiac Studies   TTE  9/5   1. Left ventricular ejection fraction, by estimation, is 25 to 30%. The  left ventricle has severely decreased function. The left ventricle  demonstrates regional wall motion abnormalities. Unusual pattern with the  mid LV wall segments appearing akinetic  while the apex and the basal segments contract. Possibly a mid-wall  Takotsubo variant. Left ventricular diastolic function could not be  evaluated.   2. Right ventricular systolic function is mildly reduced. The right  ventricular size is normal.   3. The mitral valve is normal in structure. No evidence of mitral valve  regurgitation. No evidence of mitral stenosis.   4. The aortic valve was not well visualized. Aortic valve regurgitation  is not visualized. No aortic stenosis is present.   5. Technically difficult study with very limited images.   TEE  9/9   1. Left ventricular ejection fraction, by estimation, is 50 to 55%. The  left ventricle has low normal function.   2. Right ventricular systolic function is normal. The right ventricular  size is normal.   3. Left atrial size was mildly dilated. No left atrial/left atrial  appendage thrombus was detected.   4. The  mitral valve is normal in structure. No evidence of mitral valve  regurgitation.   5. The aortic valve is tricuspid. Aortic valve regurgitation is not  visualized. No aortic stenosis is present.   6. Evidence of atrial level shunting detected by color flow Doppler.  There is a small patent foramen ovale with predominantly left to right  shunting across the atrial septum.   Patient Profile     60 y.o. 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), hypertension, and alcohol abuse who is being seen for the evaluation of CHF at the request of Dr. Carlis Abbott.  Assessment & Plan    New Onset Systolic CHF:     EF appears to be better on the TEE.  Holding diuresis.   I will increase his beta blocker today.  Likely resume some PO diuresis in the morning.    Sinus Tachycardia:     Increase beta blocker.    Bacteremia:     No evidence of endocarditis.   Small PFO incidental finding.    For questions or updates, please contact [place] Please consult [email or web address] for contact info under Cardiology/STEMI.   Signed, Minus Breeding, MD  08/15/2021, 10:55 AM

## 2021-08-15 NOTE — Evaluation (Signed)
Occupational Therapy Evaluation Patient Details Name: Frederick [name] MRN: [id number] DOB: [birth year] Today's Date: 08/15/2021    History of Present Illness Pt is a 60 year old man admitted on 08/10/21 with ETOH induced seizure. Intubated 08/10/21 to 08/12/21. Head CT negative for acute abnormality. Pt on CIWA precautions. 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 Cortrak placed 08/14/21. 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   Clinical Impression   Pt could stand briefly and transfer to his motorized w/c prior to admission. He lived with his significant other, but this will not be an option at d/c per chart. Pt unable to offer prior level of function or home set up other than he lives in a house. Pt presents with impaired cognition. Speech is minimally intelligible even after oral care. He is globally weak, requiring +2 assist for bed level mobility and mod to max assist for sitting balance. He is dependent in all ADL. Pt with stable VS throughout session HR in low 100s and BP 130/88. Pt will need SNF upon discharge.     Follow Up Recommendations  SNF;Supervision/Assistance - 24 hour    Equipment Recommendations  Other (comment) (defer to next venue)    Recommendations for Other Services       Precautions / Restrictions Precautions Precautions: Fall Precaution Comments: cortrak      Mobility Bed Mobility Overal bed mobility: Needs Assistance Bed Mobility: Supine to Sit;Sit to Supine     Supine to sit: +2 for physical assistance;Total assist Sit to supine: +2 for physical assistance;Total assist   General bed mobility comments: multimodal cues for technique, assist for all aspects    Transfers                 General transfer comment: pt became increasingly more agitated when asked to attempt to stand, abandoned attempt and assisted pt to return to supine    Balance  Overall balance assessment: Needs assistance Sitting-balance support: Feet supported Sitting balance-Leahy Scale: Zero Sitting balance - Comments: mod to max assist x 20 minutes Postural control: Posterior lean;Right lateral lean                                 ADL either performed or assessed with clinical judgement   ADL                                         General ADL Comments: Total assist     Vision   Additional Comments: unable to assess vision due to impaired cognition     Perception     Praxis      Pertinent Vitals/Pain Pain Assessment: Faces Faces Pain Scale: No hurt     Hand Dominance Right   Extremity/Trunk Assessment Upper Extremity Assessment Upper Extremity Assessment: Generalized weakness (no functional use)   Lower Extremity Assessment Lower Extremity Assessment: Defer to PT evaluation   Cervical / Trunk Assessment Cervical / Trunk Assessment: Other exceptions Cervical / Trunk Exceptions: truncal weakness, head rotated toward L   Communication Communication Communication: Expressive difficulties   Cognition Arousal/Alertness: Lethargic;Awake/alert (eyes open, but with lethargy) Behavior During Therapy: Flat affect Overall Cognitive Status: Impaired/Different from baseline Area of Impairment: Attention;Following commands;Safety/judgement;Problem solving;Memory;Orientation;Awareness  Orientation Level: Disoriented to;Place;Time;Situation Current Attention Level: Focused Memory: Decreased short-term memory Following Commands: Follows one step commands inconsistently (with multimodal cues) Safety/Judgement: Decreased awareness of safety;Decreased awareness of deficits Awareness: Intellectual Problem Solving: Slow processing;Decreased initiation;Difficulty sequencing;Requires verbal cues;Requires tactile cues General Comments: pt with escalation of agitation when asked to attempt to stand    General Comments       Exercises     Shoulder Instructions      Home Living Family/patient expects to be discharged to:: Private residence Living Arrangements: Spouse/significant other                           Home Equipment: Wheelchair - power   Additional Comments: per chart, pt lived with his S/O x 10 years, but she is moving out of their home and he will not have care once is is discharged      Prior Functioning/Environment Level of Independence: Independent with assistive device(s)        Comments: transferred to motorized w/c per chart        OT Problem List: Decreased strength;Impaired balance (sitting and/or standing);Decreased cognition;Decreased knowledge of use of DME or AE;Impaired UE functional use      OT Treatment/Interventions: Self-care/ADL training;DME and/or AE instruction;Therapeutic activities;Cognitive remediation/compensation;Patient/family education;Balance training    OT Goals(Current goals can be found in the care plan section) Acute Rehab OT Goals Patient Stated Goal: pt unable to state OT Goal Formulation: Patient unable to participate in goal setting Time For Goal Achievement: 08/29/21 Potential to Achieve Goals: Fair ADL Goals Pt Will Perform Eating: with mod assist;sitting Pt Will Perform Grooming: with mod assist;sitting Pt Will Transfer to Toilet: with +2 assist;with mod assist Additional ADL Goal #1: Pt will demonstrate fair sitting balance at EOB. Additional ADL Goal #2: Pt will follow one step commands with 50% accuracy. Additional ADL Goal #3: Pt will perform bed mobility with moderate assistance in preparation for ADL.  OT Frequency: Min 2X/week   Barriers to D/C: Decreased caregiver support          Co-evaluation PT/OT/SLP Co-Evaluation/Treatment: Yes Reason for Co-Treatment: For patient/therapist safety;Necessary to address cognition/behavior during functional activity   OT goals addressed during session:  ADL's and self-care;Strengthening/ROM      AM-PAC OT "6 Clicks" Daily Activity     Outcome Measure Help from another person eating meals?: Total Help from another person taking care of personal grooming?: Total Help from another person toileting, which includes using toliet, bedpan, or urinal?: Total Help from another person bathing (including washing, rinsing, drying)?: Total Help from another person to put on and taking off regular upper body clothing?: Total Help from another person to put on and taking off regular lower body clothing?: Total 6 Click Score: 6   End of Session Nurse Communication: Mobility status  Activity Tolerance: Treatment limited secondary to agitation;Patient limited by lethargy Patient left: in bed;with call bell/phone within reach;with bed alarm set;with nursing/sitter in room, restraints applied to hands (mitts)  OT Visit Diagnosis: Muscle weakness (generalized) (M62.81);Other symptoms and signs involving cognitive function                Time: 2979-8921 OT Time Calculation (min): 31 min Charges:  OT General Charges $OT Visit: 1 Visit OT Evaluation $OT Eval Moderate Complexity: 1 Mod  Martie Round, OTR/L Acute Rehabilitation Services Pager: [phone] Office: [phone]  Evern Bio 08/15/2021, 12:04 PM

## 2021-08-15 NOTE — Progress Notes (Signed)
Attempted to place a midline x 2 without success. Assessed x 2 IVT RN's.Veins difficult to access, and pt jerking occasionally. When blood return was obtained, pt clamping and unable to advance the catheter. RN aware. Will secure chat MD.

## 2021-08-15 NOTE — Progress Notes (Signed)
RT came by to do CPT, but therapy was working with patient. RT will try back later.

## 2021-08-15 NOTE — Progress Notes (Signed)
SLP Cancellation Note  Patient Details Name: Frederick [name] MRN: [id number] DOB: 08-09-1961   Cancelled treatment:       Reason Eval/Treat Not Completed: Fatigue/lethargy limiting ability to participate; pt woke up briefly and then fell back asleep; nursing stated this has been his baseline; had Ativan last night/continuing to withdraw; ST will continue efforts as able.   Tressie Stalker, M.S., CCC-SLP 08/15/2021, 12:40 PM

## 2021-08-15 NOTE — Evaluation (Signed)
Physical Therapy Evaluation Patient Details Name: Frederick [name] MRN: [id number] DOB: [birth year] Today's Date: 08/15/2021   History of Present Illness  Pt is a 60 year old man admitted on 08/10/21 with ETOH induced seizure. Intubated 08/10/21 to 08/12/21. Head CT negative for acute abnormality. Pt on CIWA precautions. 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 Cortrak placed 08/14/21. 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  Clinical Impression  Pt admitted with above diagnosis. Pt is from home where he used w/c as primary mobility. Per chart, wife will not be staying with him upon d/c. Pt unable to give home info due to cognitive deficits combined with expressive difficulties. Pt needed tot A for all aspects of mobility and became quite agitated when PT/OT attempted to facilitate sit>stand. Pt moving extremities minimally but L even less than R. Recommend SNF at d/c.  Pt currently with functional limitations due to the deficits listed below (see PT Problem List). Pt will benefit from skilled PT to increase their independence and safety with mobility to allow discharge to the venue listed below.       Follow Up Recommendations SNF;Supervision/Assistance - 24 hour    Equipment Recommendations  None recommended by PT    Recommendations for Other Services       Precautions / Restrictions Precautions Precautions: Fall Precaution Comments: cortrak, B mitts Restrictions Weight Bearing Restrictions: No      Mobility  Bed Mobility Overal bed mobility: Needs Assistance Bed Mobility: Supine to Sit;Sit to Supine     Supine to sit: +2 for physical assistance;Total assist Sit to supine: +2 for physical assistance;Total assist   General bed mobility comments: multimodal cues for technique, assist for all aspects    Transfers Overall transfer level: Needs assistance Equipment used: 2 person  hand held assist Transfers: Sit to/from Stand           General transfer comment: Attempted facilitation of sit>stand but pt with posterior lean and became increasingly more agitated, asking for his w/c.  abandoned attempt and assisted pt to return to supine  Ambulation/Gait             General Gait Details: ambulates minimally at baseline, uses w/c for primary mobility.  Stairs            Wheelchair Mobility    Modified Rankin (Stroke Patients Only)       Balance Overall balance assessment: Needs assistance Sitting-balance support: Feet supported Sitting balance-Leahy Scale: Zero Sitting balance - Comments: mod to max assist x 20 minutes Postural control: Posterior lean;Right lateral lean                                   Pertinent Vitals/Pain Pain Assessment: Faces Faces Pain Scale: No hurt    Home Living Family/patient expects to be discharged to:: Private residence Living Arrangements: Spouse/significant other Available Help at Discharge: Family;Available 24 hours/day           Home Equipment: Wheelchair - power Additional Comments: per chart, pt lived with his S/O x 10 years, but she is moving out of their home and he will not have care once is is discharged    Prior Function Level of Independence: Independent with assistive device(s)         Comments: transferred to motorized w/c per chart     Hand Dominance   Dominant  Hand: Right    Extremity/Trunk Assessment   Upper Extremity Assessment Upper Extremity Assessment: Defer to OT evaluation    Lower Extremity Assessment Lower Extremity Assessment: Generalized weakness;Difficult to assess due to impaired cognition (moves BLE's in limited range. L appears weaker than R)    Cervical / Trunk Assessment Cervical / Trunk Assessment: Other exceptions Cervical / Trunk Exceptions: truncal weakness, head rotated toward L  Communication   Communication: Expressive difficulties   Cognition Arousal/Alertness: Lethargic;Awake/alert (eyes open, but with lethargy) Behavior During Therapy: Flat affect Overall Cognitive Status: Impaired/Different from baseline Area of Impairment: Attention;Following commands;Safety/judgement;Problem solving;Memory;Orientation;Awareness                 Orientation Level: Disoriented to;Place;Time;Situation Current Attention Level: Focused Memory: Decreased short-term memory Following Commands: Follows one step commands inconsistently (with multimodal cues) Safety/Judgement: Decreased awareness of safety;Decreased awareness of deficits Awareness: Intellectual Problem Solving: Slow processing;Decreased initiation;Difficulty sequencing;Requires verbal cues;Requires tactile cues General Comments: Difficulty understanding pt in part due to poor mgmt of secretions. Pt initially thinking he is at home. Pt with escalation of agitation when asked to attempt to stand      General Comments General comments (skin integrity, edema, etc.): HR maintained in low 100's, SPO2 in upper 90's on RA, BP stable 130/88 after session    Exercises     Assessment/Plan    PT Assessment Patient needs continued PT services  PT Problem List Decreased strength;Decreased range of motion;Decreased activity tolerance;Decreased balance;Decreased mobility;Decreased cognition;Decreased knowledge of use of DME;Decreased safety awareness;Decreased coordination       PT Treatment Interventions DME instruction;Stair training;Functional mobility training;Therapeutic activities;Therapeutic exercise;Balance training;Cognitive remediation;Neuromuscular re-education;Patient/family education    PT Goals (Current goals can be found in the Care Plan section)  Acute Rehab PT Goals Patient Stated Goal: pt unable to state PT Goal Formulation: Patient unable to participate in goal setting Time For Goal Achievement: 08/29/21 Potential to Achieve Goals: Fair    Frequency  Min 2X/week   Barriers to discharge Decreased caregiver support apparently wife will not be with him at d/c    Co-evaluation PT/OT/SLP Co-Evaluation/Treatment: Yes Reason for Co-Treatment: Complexity of the patient's impairments (multi-system involvement);Necessary to address cognition/behavior during functional activity;For patient/therapist safety PT goals addressed during session: Mobility/safety with mobility;Balance OT goals addressed during session: ADL's and self-care;Strengthening/ROM       AM-PAC PT "6 Clicks" Mobility  Outcome Measure Help needed turning from your back to your side while in a flat bed without using bedrails?: Total Help needed moving from lying on your back to sitting on the side of a flat bed without using bedrails?: Total Help needed moving to and from a bed to a chair (including a wheelchair)?: Total Help needed standing up from a chair using your arms (e.g., wheelchair or bedside chair)?: Total Help needed to walk in hospital room?: Total Help needed climbing 3-5 steps with a railing? : Total 6 Click Score: 6    End of Session   Activity Tolerance: Treatment limited secondary to agitation Patient left: in bed;with call bell/phone within reach;with bed alarm set;with nursing/sitter in room;with restraints reapplied (B mitts) Nurse Communication: Mobility status PT Visit Diagnosis: Muscle weakness (generalized) (M62.81)    Time: 9628-3662 PT Time Calculation (min) (ACUTE ONLY): 30 min   Charges:   PT Evaluation $PT Eval Moderate Complexity: 1 Mod          [place], PT  Acute Rehab Services  Pager [phone] Office [phone]   Lawana Chambers Zennie Ayars 08/15/2021, 1:20 PM

## 2021-08-15 NOTE — Anesthesia Postprocedure Evaluation (Signed)
Anesthesia Post Note  Patient: [name]  Procedure(s) Performed: TRANSESOPHAGEAL ECHOCARDIOGRAM (TEE)     Patient location during evaluation: Endoscopy Anesthesia Type: MAC Level of consciousness: responds to stimulation Pain management: pain level controlled Vital Signs Assessment: post-procedure vital signs reviewed and stable Respiratory status: spontaneous breathing, nonlabored ventilation, respiratory function stable and patient connected to nasal cannula oxygen Cardiovascular status: stable and blood pressure returned to baseline Postop Assessment: no apparent nausea or vomiting Anesthetic complications: no   No notable events documented.  Last Vitals:  Vitals:   08/15/21 0300 08/15/21 0400  BP: (!) 142/76 (!) 165/75  Pulse: 100 97  Resp: (!) 30 (!) 31  Temp:  37.8 C  SpO2: 96% 95%    Last Pain:  Vitals:   08/15/21 0400  TempSrc: Axillary                 Antonella Upson P Sion Thane

## 2021-08-15 NOTE — Progress Notes (Signed)
NAME:  Frederick [name], MRN:  [id number], DOB:  1961/07/12, LOS: 5 ADMISSION DATE:  08/10/2021, CONSULTATION DATE:  08/10/2022 REFERRING MD:  , CHIEF COMPLAINT:  Seizure with airway compromise  History of Present Illness:  Frederick [name] is an 60 y.o. male  current every day smoker , chronic heavy beer drinker (significantly > 80 oz per day per wife) with more recent escalation of intake to include hard liquor, but with decreased intake yesterday relative to his baseline. (Drank "only" two 40 oz beers per wife) presenting from home with a 1 week history of recurrent focal seizure activity based on wife's description. She had called EMS for what she felt was a stroke. On EMS arrival, he was actively seizing with GTC seizure and was administered 5 mg IM Versed which stopped the seizure. CBG was 70 in the field per EMS,  without cessation of seizure activity. O2 was originally 80% on room air however he went up to 93 on nasal cannula. In the ED he received 2 mg IV Ativan and 3000 mg IV Keppra, also without cessation.   Pt decompensated and was intubated for airway protection in setting of seizures in the ED after second dose of Ativan.   In the ED Head imaging was negative for acute intracranial abnormalities. 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  Of note patient has a history of TB which was diagnosed in 2001. He was treated at that time by the [place] x 6 months. Pt had recurrence in 2016 , and was treated with  isoniazid, rifampin, ethambutol, pyrazinamide and B6 by the [facility].  Labs reviewed : K 2.5, Chloride 115, CO2 16, Creatinine 0.47,Calcium 5.2, Albumin 1.3, Alk Phos 35 HGB 7.5, WBC 4.2, platelets of 113 T Max of 98.8  CXR 9/5>>  Extensive chronic lung disease changes with peribronchial thickening, chronic nodularity and  chronic interstitial disease.   Chronic scarring and volume loss in RIGHT upper lobe with mediastinal shift to RIGHT  Pertinent  Medical History   Past Medical History:  Diagnosis Date   Arthritis    Asthma    Bilateral lower extremity edema 05/28/2021   propping feet up   Cough 05/28/2021   with clear sputum   Dyspnea    Dyspnea 05/28/2021   with exertion   EtOH dependence (HCC)    drinks 2-3 of 40ounes a day   Fibula fracture    Dr Mardelle Matte 02-2011   Finger osteomyelitis, right ([place]) 05/26/2021   right index finger   Hernia    surgery 01-17-13   Hypertension    not taking bp meds since may 2022   Tuberculosis    couple of yrs ago took tx at [place] per pt on 05-28-2021     Significant Hospital Events: Including procedures, antibiotic start and stop dates in addition to other pertinent events   Admission 08/10/2022 Started on unasyn for presumed aspiration pneumonia 9/6; overnight blood cultures from admission returned positive.  Extubated 9/7  Interim History / Subjective:  Drowsy this morning for me however intermittently follows commands for RN overnight. Protecting airway.  Objective   Blood pressure (!) 143/98, pulse (!) 101, temperature 100 F (37.8 C), temperature source Axillary, resp. rate (!) 35, height '6\' 1"'  (1.854 m), weight 72.9 kg, SpO2 97 %.  Intake/Output Summary (Last 24 hours) at 08/15/2021 0759 Last data filed at 08/15/2021 0700 Gross per 24 hour  Intake 1284.95 ml  Output 700 ml  Net 584.95 ml   Filed Weights   08/10/21 1000 08/10/21 1627  Weight: 75 kg 72.9 kg   Physical Exam: General: Chronically ill-appearing, alert, encephalopathic, mild agitation HENT: [place], AT, OP clear, MMM Eyes: EOMI, no scleral icterus Respiratory: Anterior rhonchi, no wheezing Cardiovascular: RRR, -M/R/G, no JVD GI: BS+, soft, nontender Extremities:-Edema,-tenderness Neuro: AAO to self, moves extremities x 4 spontaneously, follows  commands   CBC and BMET reviewed Na 133 Mg 1.8 - repleted  9/5 Blood cultures in 2 of 4 bottles - Strep anginosis.  9/6 Resp culture - normal flora 9/7 Blood cultures - NGTD   cEEG> moderate diffuse encephalopathy  Resolved Hospital Problem list   hypokalemia  Assessment & Plan:  Acute hypoxic respiratory failure in setting of new onset seizures, suspected to be due to ETOH withdrawal. 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 Concern for aspiration pneumonia -Continue Ceftriaxone -Wean supplemental O2 as able to maintain SpO2>90% -CPT, guaifenesin  -NTS PRN -Speech evaluation when mental status improves  Acute metabolic encephalopathy due to ETOH withdrawal -CIWA -reorient  Strep bacteremia; from separate sites this is less likely a contaminant -repeat blood cultures pending from 9/7 NGTD -TEE neg for vegetations -Continue Ceftriaxone D3 -no CVCs  Acute HFrEF EF25-30%>45-50% on TEE most likely due to ETOH cardiomyopathy -Start coreg and entresto  -Appreciate cardiology's input. No ischemia evaluation needed.  HTN -labetalol PRN -Start coreg and Entresto. Monitor K and Cr  Seizures in setting of likely ETOH withdrawal No acute intracranial abnormalities on CT -appreciate Neurology's assistance; con't keppra, which will be weaned off as an outpatient. -Maintain seizure precautions  -MRI limited by severe motion  DM, no hyperglycemia while off TF. Fingersticks not correlating with serum samples. -SSI PRN -goal BG 140-180 -fingersticks unreliable. Plan for midline for lab draws  Moderate protein energy malnutrition due to ETOH abuse -TF  Deconditioning -PT, OT, SLP   Best Practice (right click and "Reselect all SmartList Selections" daily)   Diet/type: tubefeeds DVT prophylaxis: LMWH GI prophylaxis: N/A Lines: N/A Foley:  N/A Code Status:  full code Last date of  multidisciplinary goals of care discussion [ 9/8- friend/ex-girlfriend Sandra] Dispo: Remain in ICU  Labs   CBC: Recent Labs  Lab 08/10/21 1010 08/10/21 1244 08/11/21 0447 08/11/21 0508 08/14/21 1220 08/15/21 0431  WBC 4.2  --  11.1*  --  8.3 6.7  NEUTROABS 3.0  --   --   --   --   --   HGB 7.5* 14.3 14.2 12.9* 12.3* 12.3*  HCT 23.9* 42.0 42.5 38.0* 37.0* 35.7*  MCV 100.0  --  95.7  --  93.7 91.1  PLT 113*  --  161  --  196 976    Basic Metabolic Panel: Recent Labs  Lab 08/11/21 0225 08/11/21 0508 08/11/21 0838 08/11/21 1439 08/11/21 2030 08/12/21 0453 08/13/21 0347 08/14/21 0458 08/14/21 1220 08/14/21 1752 08/15/21 0431  NA 134* 136  --   --   --  135 135  --  133*  --  133*  K 4.1 3.2*  --   --   --  3.2* 3.8  --  3.5  --  3.5  CL 97*  --   --   --   --  101 99  --  96*  --  97*  CO2 27  --   --   --   --  25 27  --  29  --  27  GLUCOSE 138*  --    < > 98  --  117* 96  --  106*  --  132*  BUN 5*  --   --   --   --  10 7*  --  9  --  11  CREATININE 0.86  --   --   --   --  0.93 0.82  --  0.77  --  0.77  CALCIUM 8.8*  --   --   --   --  8.4* 8.8*  --  8.8*  --  9.0  MG 1.3*  --   --   --    < > 1.8 1.7 1.6*  --  2.1 1.8  PHOS 2.3*  --   --   --   --  3.9 3.5 3.0  --  3.4 3.9   < > = values in this interval not displayed.   GFR: Estimated Creatinine Clearance: 100 mL/min (by C-G formula based on SCr of 0.77 mg/dL). Recent Labs  Lab 08/10/21 1010 08/10/21 1537 08/11/21 0225 08/11/21 0447 08/11/21 0820 08/14/21 1220 08/15/21 0431  PROCALCITON  --  5.20  --   --   --   --   --   WBC 4.2  --   --  11.1*  --  8.3 6.7  LATICACIDVEN  --  3.3* 7.5*  --  2.6*  --   --     The patient is critically ill with altered mental status, bacteremia, concern for air protection and is high risk for deterioration and requires high complexity decision making for assessment and support, frequent evaluation and titration of therapies, application of advanced monitoring  technologies and extensive interpretation of multiple databases.  Independent Critical Care Time: 40 Minutes.   Rodman Pickle, M.D. [facility] Pulmonary/Critical Care Medicine 08/15/2021 7:59 AM   Please see Amion for pager number to reach on-call Pulmonary and Critical Care Team.

## 2021-08-16 DIAGNOSIS — I5021 Acute systolic (congestive) heart failure: Secondary | ICD-10-CM | POA: Diagnosis not present

## 2021-08-16 DIAGNOSIS — R7881 Bacteremia: Secondary | ICD-10-CM | POA: Diagnosis not present

## 2021-08-16 DIAGNOSIS — B951 Streptococcus, group B, as the cause of diseases classified elsewhere: Secondary | ICD-10-CM | POA: Diagnosis not present

## 2021-08-16 DIAGNOSIS — R569 Unspecified convulsions: Secondary | ICD-10-CM | POA: Diagnosis not present

## 2021-08-16 LAB — BASIC METABOLIC PANEL
Anion gap: 7 (ref 5–15)
BUN: 14 mg/dL (ref 8–23)
CO2: 26 mmol/L (ref 22–32)
Calcium: 8.6 mg/dL — ABNORMAL LOW (ref 8.9–10.3)
Chloride: 99 mmol/L (ref 98–111)
Creatinine, Ser: 0.81 mg/dL (ref 0.61–1.24)
GFR, Estimated: >60 mL/min (ref >60)
Glucose, Bld: 122 mg/dL — ABNORMAL HIGH (ref 70–99)
Potassium: 4.2 mmol/L (ref 3.5–5.1)
Sodium: 132 mmol/L — ABNORMAL LOW (ref 135–145)

## 2021-08-16 LAB — PHOSPHORUS
Phosphorus: 3.6 mg/dL (ref 2.5–4.6)
Phosphorus: 3.6 mg/dL (ref 2.5–4.6)

## 2021-08-16 LAB — GLUCOSE, CAPILLARY
Glucose-Capillary: <10 mg/dL — CL (ref 70–99)
Glucose-Capillary: <10 mg/dL — CL (ref 70–99)
Glucose-Capillary: <10 mg/dL — CL (ref 70–99)
Glucose-Capillary: 118 mg/dL — ABNORMAL HIGH (ref 70–99)
Glucose-Capillary: 124 mg/dL — ABNORMAL HIGH (ref 70–99)
Glucose-Capillary: 95 mg/dL (ref 70–99)
Glucose-Capillary: 117 mg/dL — ABNORMAL HIGH (ref 70–99)
Glucose-Capillary: 124 mg/dL — ABNORMAL HIGH (ref 70–99)
Glucose-Capillary: 92 mg/dL (ref 70–99)

## 2021-08-16 LAB — CBC
HCT: 33.5 % — ABNORMAL LOW (ref 39.0–52.0)
Hemoglobin: 11.6 g/dL — ABNORMAL LOW (ref 13.0–17.0)
MCH: 31.9 pg (ref 26.0–34.0)
MCHC: 34.6 g/dL (ref 30.0–36.0)
MCV: 92 fL (ref 80.0–100.0)
Platelets: 199 10*3/uL (ref 150–400)
RBC: 3.64 MIL/uL — ABNORMAL LOW (ref 4.22–5.81)
RDW: 14.2 % (ref 11.5–15.5)
WBC: 8.2 10*3/uL (ref 4.0–10.5)
nRBC: 0 % (ref 0.0–0.2)

## 2021-08-16 LAB — MAGNESIUM
Magnesium: 1.7 mg/dL (ref 1.7–2.4)
Magnesium: 2 mg/dL (ref 1.7–2.4)

## 2021-08-16 MED ORDER — SODIUM CHLORIDE 0.9 % IV SOLN
INTRAVENOUS | Status: DC | PRN
  Administered 2021-08-16: 500 mL via INTRAVENOUS

## 2021-08-16 MED ORDER — LORAZEPAM 2 MG/ML IJ SOLN: 1.0000 mg | INTRAMUSCULAR | Status: DC | PRN

## 2021-08-16 MED ORDER — SODIUM CHLORIDE 0.9 % IV SOLN
INTRAVENOUS | Status: DC | PRN
  Administered 2021-08-16 – 2021-08-17 (×2): 500 mL via INTRAVENOUS

## 2021-08-16 MED ORDER — GUAIFENESIN 100 MG/5ML PO SOLN
5.0000 mL | Freq: Four times a day (QID) | ORAL | Status: AC
Start: 2021-08-16 — End: 2021-08-18
  Administered 2021-08-16 – 2021-08-18 (×6): 100 mg
  Filled 2021-08-16: qty 15
  Filled 2021-08-16: qty 5
  Filled 2021-08-16: qty 15
  Filled 2021-08-16: qty 5
  Filled 2021-08-16 (×2): qty 15

## 2021-08-16 MED ORDER — MAGNESIUM SULFATE 2 GM/50ML IV SOLN
2.0000 g | Freq: Once | INTRAVENOUS | Status: AC
  Administered 2021-08-16: 2 g via INTRAVENOUS
  Filled 2021-08-16: qty 50

## 2021-08-16 MED ORDER — LORAZEPAM 1 MG PO TABS: 1.0000 mg | ORAL_TABLET | ORAL | Status: DC | PRN

## 2021-08-16 MED ORDER — LEVETIRACETAM 100 MG/ML PO SOLN
1000.0000 mg | Freq: Two times a day (BID) | ORAL | Status: DC
  Administered 2021-08-16 – 2021-08-23 (×15): 1000 mg
  Filled 2021-08-16 (×15): qty 10

## 2021-08-16 MED ORDER — THIAMINE HCL 100 MG PO TABS: 100.0000 mg | ORAL_TABLET | Freq: Every day | ORAL | Status: DC

## 2021-08-16 MED ORDER — LORAZEPAM 1 MG PO TABS
1.0000 mg | ORAL_TABLET | ORAL | Status: AC | PRN
  Administered 2021-08-16 – 2021-08-17 (×3): 2 mg
  Administered 2021-08-17: 1 mg
  Administered 2021-08-17: 2 mg
  Filled 2021-08-16 (×2): qty 2
  Filled 2021-08-16: qty 1
  Filled 2021-08-16 (×2): qty 2

## 2021-08-16 MED ORDER — LORAZEPAM 2 MG/ML IJ SOLN
1.0000 mg | INTRAMUSCULAR | Status: AC | PRN
  Administered 2021-08-18 – 2021-08-19 (×2): 2 mg via INTRAVENOUS
  Filled 2021-08-16 (×2): qty 1

## 2021-08-16 NOTE — Progress Notes (Signed)
Progress Note  Patient Name: Frederick [name] Date of Encounter: 08/16/2021  Primary Cardiologist:   None   Subjective   Answers questions.  Denies pain. No SOB.   Inpatient Medications    Scheduled Meds:  carvedilol  6.25 mg Per Tube BID WC   chlorhexidine gluconate (MEDLINE KIT)  15 mL Mouth Rinse BID   Chlorhexidine Gluconate Cloth  6 each Topical Daily   enoxaparin (LOVENOX) injection  40 mg Subcutaneous Q24H   feeding supplement (PROSource TF)  45 mL Per Tube BID   folic acid  1 mg Per Tube Daily   guaiFENesin  5 mL Oral Q6H   insulin aspart  0-9 Units Subcutaneous Q4H   multivitamin with minerals  1 tablet Per Tube Daily   sacubitril-valsartan  1 tablet Oral BID   thiamine injection  100 mg Intravenous Daily   Continuous Infusions:  cefTRIAXone (ROCEPHIN)  IV Stopped (08/15/21 1318)   feeding supplement (OSMOLITE 1.5 CAL) 1,000 mL (08/15/21 1406)   levETIRAcetam 1,000 mg (08/16/21 0937)   PRN Meds: docusate, ipratropium-albuterol, labetalol, ondansetron (ZOFRAN) IV, polyethylene glycol   Vital Signs    Vitals:   08/16/21 0400 08/16/21 0500 08/16/21 0600 08/16/21 0803  BP: 98/74  103/68 121/72  Pulse: (!) 104 (!) 109 99 (!) 110  Resp: (!) 30 (!) 24 (!) 23   Temp: 99.8 F (37.7 C)     TempSrc: Axillary     SpO2: 93% 93% 93%   Weight:      Height:        Intake/Output Summary (Last 24 hours) at 08/16/2021 1113 Last data filed at 08/16/2021 0800 Gross per 24 hour  Intake 1245.06 ml  Output 540 ml  Net 705.06 ml   Filed Weights   08/10/21 1000 08/10/21 1627  Weight: 75 kg 72.9 kg    Telemetry    NSR, sinus tach - Personally Reviewed  ECG    NA - Personally Reviewed  Physical Exam   GEN: No acute distress.   Chronically ill appearing Neck: No  JVD Cardiac: RRR, no murmurs, rubs, or gallops.  Respiratory: Clear   to auscultation bilaterally. GI: Soft, nontender, non-distended, normal bowel sounds  MS:  No  edema; No deformity. Neuro:    Nonfocal  Psych:   Confused but answers questions.     Labs    Chemistry Recent Labs  Lab 08/10/21 1010 08/10/21 1244 08/14/21 1220 08/15/21 0431 08/16/21 0541  NA 136   < > 133* 133* 132*  K 2.5*   < > 3.5 3.5 4.2  CL 115*   < > 96* 97* 99  CO2 16*   < > _0 GLUCOSE 78   < > 106* 132* 122*  BUN <5*   < > _1 CREATININE 0.46*   < > 0.77 0.77 0.81  CALCIUM 5.2*   < > 8.8* 9.0 8.6*  PROT 3.9*  --   --   --   --   ALBUMIN 1.3*  --   --   --   --   AST 30  --   --   --   --   ALT 21  --   --   --   --   ALKPHOS 35*  --   --   --   --   BILITOT 0.6  --   --   --   --   GFRNONAA >60   < > >60 >60 >60  ANIONGAP 5   < > _0 < > = values in this interval not displayed.     Hematology Recent Labs  Lab 08/14/21 1220 08/15/21 0431 08/16/21 0541  WBC 8.3 6.7 8.2  RBC 3.95* 3.92* 3.64*  HGB 12.3* 12.3* 11.6*  HCT 37.0* 35.7* 33.5*  MCV 93.7 91.1 92.0  MCH 31.1 31.4 31.9  MCHC 33.2 34.5 34.6  RDW 14.6 14.5 14.2  PLT 196 208 199    Cardiac EnzymesNo results for input(s): TROPONINI in the last 168 hours. No results for input(s): TROPIPOC in the last 168 hours.   BNP Recent Labs  Lab 08/10/21 1537  BNP 401.9*     DDimer No results for input(s): DDIMER in the last 168 hours.   Radiology    DG Abd Portable 1V  Result Date: 08/14/2021 CLINICAL DATA:  Feeding tube placement. EXAM: PORTABLE ABDOMEN - 1 VIEW COMPARISON:  August 10, 2021. FINDINGS: The bowel gas pattern is normal. Distal tip of feeding tube is seen in expected position of distal stomach. No radio-opaque calculi or other significant radiographic abnormality are seen. IMPRESSION: Distal tip of feeding tube is seen in expected position of distal stomach. Electronically Signed   By: Marijo Conception M.D.   On: 08/14/2021 16:09   ECHO TEE  Result Date: 08/14/2021    TRANSESOPHOGEAL ECHO REPORT   Patient Name:   [name] Date of Exam: 08/14/2021 Medical Rec #:  [id number]      Height:       73.0  in Accession #:    [id number]     Weight:       160.7 lb Date of Birth:  [birth year]      BSA:          1.961 m Patient Age:    60 years       BP:           110/70 mmHg Patient Gender: M              HR:           115 bpm. Exam Location:  Inpatient Procedure: Transesophageal Echo Indications:    Bacteremia  History:        Patient has prior history of Echocardiogram examinations.                 Signs/Symptoms:Bacteremia.  Sonographer:    Merrie Roof RDCS Referring Phys: [id number] Advance: The transesophogeal probe was passed without difficulty through the esophogus of the patient. Sedation performed by different physician. The patient developed no complications during the procedure. IMPRESSIONS  1. Left ventricular ejection fraction, by estimation, is 50 to 55%. The left ventricle has low normal function.  2. Right ventricular systolic function is normal. The right ventricular size is normal.  3. Left atrial size was mildly dilated. No left atrial/left atrial appendage thrombus was detected.  4. The mitral valve is normal in structure. No evidence of mitral valve regurgitation.  5. The aortic valve is tricuspid. Aortic valve regurgitation is not visualized. No aortic stenosis is present.  6. Evidence of atrial level shunting detected by color flow Doppler. There is a small patent foramen ovale with predominantly left to right shunting across the atrial septum. FINDINGS  Left Ventricle: Left ventricular ejection fraction, by estimation, is 50 to 55%. The left ventricle has low normal function. The left ventricular internal cavity size was normal in size. Right Ventricle: The right ventricular size is  normal. No increase in right ventricular wall thickness. Right ventricular systolic function is normal. Left Atrium: Left atrial size was mildly dilated. No left atrial/left atrial appendage thrombus was detected. Right Atrium: Right atrial size was normal in size. Pericardium: There is no evidence of  pericardial effusion. Mitral Valve: The mitral valve is normal in structure. No evidence of mitral valve regurgitation. There is no evidence of mitral valve vegetation. Tricuspid Valve: The tricuspid valve is normal in structure. Tricuspid valve regurgitation is mild. There is no evidence of tricuspid valve vegetation. Aortic Valve: The aortic valve is tricuspid. Aortic valve regurgitation is not visualized. No aortic stenosis is present. There is no evidence of aortic valve vegetation. Pulmonic Valve: The pulmonic valve was normal in structure. Pulmonic valve regurgitation is trivial. There is no evidence of pulmonic valve vegetation. Aorta: The aortic root is normal in size and structure. IAS/Shunts: Evidence of atrial level shunting detected by color flow Doppler. A small patent foramen ovale is detected with predominantly left to right shunting across the atrial septum. Jenkins Rouge MD Electronically signed by Jenkins Rouge MD Signature Date/Time: 08/14/2021/2:03:13 PM    Final     Cardiac Studies   TTE  9/5   1. Left ventricular ejection fraction, by estimation, is 25 to 30%. The  left ventricle has severely decreased function. The left ventricle  demonstrates regional wall motion abnormalities. Unusual pattern with the  mid LV wall segments appearing akinetic  while the apex and the basal segments contract. Possibly a mid-wall  Takotsubo variant. Left ventricular diastolic function could not be  evaluated.   2. Right ventricular systolic function is mildly reduced. The right  ventricular size is normal.   3. The mitral valve is normal in structure. No evidence of mitral valve  regurgitation. No evidence of mitral stenosis.   4. The aortic valve was not well visualized. Aortic valve regurgitation  is not visualized. No aortic stenosis is present.   5. Technically difficult study with very limited images.   TEE  9/9   1. Left ventricular ejection fraction, by estimation, is 50 to 55%. The   left ventricle has low normal function.   2. Right ventricular systolic function is normal. The right ventricular  size is normal.   3. Left atrial size was mildly dilated. No left atrial/left atrial  appendage thrombus was detected.   4. The mitral valve is normal in structure. No evidence of mitral valve  regurgitation.   5. The aortic valve is tricuspid. Aortic valve regurgitation is not  visualized. No aortic stenosis is present.   6. Evidence of atrial level shunting detected by color flow Doppler.  There is a small patent foramen ovale with predominantly left to right  shunting across the atrial septum.   Patient Profile     60 y.o. 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 Carlis Abbott.  Assessment & Plan    New Onset Systolic CHF:     EF appears to be better on the TEE.   Increased beta blocker.    Hold diuresis again today.    Sinus Tachycardia:     Increased beta blocker yesterday.  BP is low.  No further med titration.      Bacteremia:     No evidence of endocarditis.   Small PFO  incidental finding.    For questions or updates, please contact [place] Please consult [email or web address] for contact info under Cardiology/STEMI.   Signed, Minus Breeding, MD  08/16/2021, 11:13 AM

## 2021-08-16 NOTE — Progress Notes (Signed)
NAME:  Frederick [name], MRN:  [id number], DOB:  Jul 03, 1961, LOS: 6 ADMISSION DATE:  08/10/2021, CONSULTATION DATE:  08/10/2022 REFERRING MD:  , CHIEF COMPLAINT:  Seizure with airway compromise  History of Present Illness:  Frederick [name] is an 60 y.o. male  current every day smoker , chronic heavy beer drinker (significantly > 80 oz per day per wife) with more recent escalation of intake to include hard liquor, but with decreased intake yesterday relative to his baseline. (Drank "only" two 40 oz beers per wife) presenting from home with a 1 week history of recurrent focal seizure activity based on wife's description. She had called EMS for what she felt was a stroke. On EMS arrival, he was actively seizing with GTC seizure and was administered 5 mg IM Versed which stopped the seizure. CBG was 70 in the field per EMS,  without cessation of seizure activity. O2 was originally 80% on room air however he went up to 93 on nasal cannula. In the ED he received 2 mg IV Ativan and 3000 mg IV Keppra, also without cessation.   Pt decompensated and was intubated for airway protection in setting of seizures in the ED after second dose of Ativan.   In the ED Head imaging was negative for acute intracranial abnormalities. 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  Of note patient has a history of TB which was diagnosed in 2001. He was treated at that time by the [facility] x 6 months. Pt had recurrence in 2016 , and was treated with  isoniazid, rifampin, ethambutol, pyrazinamide and B6 by the [place] [facility].  Labs reviewed : K 2.5, Chloride 115, CO2 16, Creatinine 0.47,Calcium 5.2, Albumin 1.3, Alk Phos 35 HGB 7.5, WBC 4.2, platelets of 113 T Max of 98.8  CXR 9/5>>  Extensive chronic lung disease changes with peribronchial thickening, chronic nodularity and  chronic interstitial disease.   Chronic scarring and volume loss in RIGHT upper lobe with mediastinal shift to RIGHT  Pertinent  Medical History   Past Medical History:  Diagnosis Date   Arthritis    Asthma    Bilateral lower extremity edema 05/28/2021   propping feet up   Cough 05/28/2021   with clear sputum   Dyspnea    Dyspnea 05/28/2021   with exertion   EtOH dependence (HCC)    drinks 2-3 of 40ounes a day   Fibula fracture    Dr Mardelle Matte 02-2011   Finger osteomyelitis, right ([place]) 05/26/2021   right index finger   Hernia    surgery 01-17-13   Hypertension    not taking bp meds since may 2022   Tuberculosis    couple of yrs ago took tx at [place] per pt on 05-28-2021     Significant Hospital Events: Including procedures, antibiotic start and stop dates in addition to other pertinent events   Admission 08/10/2022 Started on unasyn for presumed aspiration pneumonia 9/6; overnight blood cultures from admission returned positive.  Extubated 9/7  Interim History / Subjective:  Awake this morning. Tremulous  Objective   Blood pressure 108/67, pulse (!) 108, temperature 99.8 F (37.7 C), temperature source Axillary, resp. rate (!) 32, height '6\' 1"'  (1.854 m), weight 72.9 kg, SpO2 97 %.        Intake/Output Summary (Last 24 hours) at 08/16/2021 1126 Last data  filed at 08/16/2021 0800 Gross per 24 hour  Intake 1245.06 ml  Output 540 ml  Net 705.06 ml   Filed Weights   08/10/21 1000 08/10/21 1627  Weight: 75 kg 72.9 kg   Physical Exam: General: Chronically ill-appearing, no acute distress HENT: [place], AT, OP clear, MMM Eyes: EOMI, no scleral icterus Respiratory: Anterior rhonchi bilaterally.  No crackles, wheezing Cardiovascular: Mild tachycardia. Regular rate.  -M/R/G, no JVD GI: BS+, soft, nontender Extremities:-Edema,-tenderness Neuro: Awake, oriented to self, follows commands, moves extremities spontaneously x 4   CBC and BMET  reviewed Na 132 Glucose 122 Mg 1.7  9/5 Blood cultures in 2 of 4 bottles - Strep anginosis.  9/6 Resp culture - normal flora 9/7 Blood cultures - NGTD  cEEG> moderate diffuse encephalopathy  Resolved Hospital Problem list   hypokalemia  Assessment & Plan:  Acute hypoxic respiratory failure in setting of new onset seizures, suspected to be due to ETOH withdrawal. 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 No ischemia evaluation needed.  HTN - normotensive - labetalol PRN - Continue coreg  - Start entresto when pressure tolerates  Seizures in setting of likely ETOH withdrawal No acute intracranial abnormalities on CT -appreciate Neurology's assistance; con't keppra, which will be weaned off as an outpatient. -Maintain seizure precautions  -MRI limited by severe motion  DM, no hyperglycemia while off TF. Fingersticks not correlating with serum samples. -SSI PRN -goal BG 140-180 -fingersticks unreliable. Blood glucose on BMET ok  Moderate protein energy malnutrition due to ETOH abuse -TF  Deconditioning -PT, OT, SLP   Best Practice (right click and "Reselect all SmartList Selections" daily)   Diet/type: tubefeeds DVT prophylaxis: LMWH GI prophylaxis: N/A Lines: N/A Foley:  N/A Code Status:  full code Last date of  multidisciplinary goals of care discussion [ 9/8- friend/ex-girlfriend Sandra] Dispo: Transfer to Progressive, TRH to pick-up 9/12  Labs   CBC: Recent Labs  Lab 08/10/21 1010 08/10/21 1244 08/11/21 0447 08/11/21 0508 08/14/21 1220 08/15/21 0431 08/16/21 0541  WBC 4.2  --  11.1*  --  8.3 6.7 8.2  NEUTROABS 3.0  --   --   --   --   --   --   HGB 7.5*   < > 14.2 12.9* 12.3* 12.3* 11.6*  HCT 23.9*   < > 42.5 38.0* 37.0* 35.7* 33.5*  MCV 100.0  --  95.7  --  93.7 91.1 92.0  PLT 113*  --  161  --  196 208 199   < > = values in this interval not displayed.    Basic Metabolic Panel: Recent Labs  Lab 08/12/21 0453 08/13/21 0347 08/14/21 0458 08/14/21 1220 08/14/21 1752 08/15/21 0431 08/15/21 1655 08/16/21 0541  NA 135 135  --  133*  --  133*  --  132*  K 3.2* 3.8  --  3.5  --  3.5  --  4.2  CL 101 99  --  96*  --  97*  --  99  CO2 25 27  --  29  --  27  --  26  GLUCOSE 117* 96  --  106*  --  132*  --  122*  BUN 10 7*  --  9  --  11  --  14  CREATININE 0.93 0.82  --  0.77  --  0.77  --  0.81  CALCIUM 8.4* 8.8*  --  8.8*  --  9.0  --  8.6*  MG 1.8 1.7 1.6*  --  2.1 1.8 1.9 1.7  PHOS 3.9 3.5 3.0  --  3.4 3.9 3.6 3.6   GFR: Estimated Creatinine Clearance: 98.8 mL/min (by C-G formula based on SCr of 0.81 mg/dL). Recent Labs  Lab 08/10/21 1537 08/11/21 0225 08/11/21 0447 08/11/21 0820 08/14/21 1220 08/15/21 0431 08/16/21 0541  PROCALCITON 5.20  --   --   --   --   --   --   WBC  --   --  11.1*  --  8.3 6.7 8.2  LATICACIDVEN 3.3* 7.5*  --  2.6*  --   --   --    Rodman Pickle, M.D. [facility] Pulmonary/Critical Care Medicine 08/16/2021 11:27 AM   See Amion for personal pager For hours between 7 PM to 7 AM, please call [place] for urgent questions

## 2021-08-16 NOTE — Progress Notes (Signed)
SLP Cancellation Note  Patient Details Name: Frederick [name] MRN: [id number] DOB: [birth year]   Cancelled treatment:       Reason Eval/Treat Not Completed: Patient at procedure or test/unavailable (Pt currently being assessed by MD. SLP will follow up later today as schedule allows.)  Liseth Wann I. Vear Clock, MS, CCC-SLP Acute Rehabilitation Services Office number [phone] Pager [phone]   Scheryl Marten 08/16/2021, 11:22 AM

## 2021-08-17 ENCOUNTER — Inpatient Hospital Stay (HOSPITAL_COMMUNITY): Payer: Medicare Other

## 2021-08-17 DIAGNOSIS — I5021 Acute systolic (congestive) heart failure: Secondary | ICD-10-CM | POA: Diagnosis not present

## 2021-08-17 DIAGNOSIS — R7881 Bacteremia: Secondary | ICD-10-CM | POA: Diagnosis not present

## 2021-08-17 LAB — CULTURE, BLOOD (ROUTINE X 2)
Culture: NO GROWTH
Culture: NO GROWTH
Special Requests: ADEQUATE

## 2021-08-17 LAB — GLUCOSE, CAPILLARY
Glucose-Capillary: 96 mg/dL (ref 70–99)
Glucose-Capillary: 123 mg/dL — ABNORMAL HIGH (ref 70–99)
Glucose-Capillary: 106 mg/dL — ABNORMAL HIGH (ref 70–99)
Glucose-Capillary: 94 mg/dL (ref 70–99)
Glucose-Capillary: 88 mg/dL (ref 70–99)
Glucose-Capillary: 110 mg/dL — ABNORMAL HIGH (ref 70–99)

## 2021-08-17 MED ORDER — IOHEXOL 350 MG/ML SOLN
100.0000 mL | Freq: Once | INTRAVENOUS | Status: AC | PRN
  Administered 2021-08-17: 100 mL via INTRAVENOUS

## 2021-08-17 MED ORDER — PENICILLIN G POTASSIUM 20000000 UNITS IJ SOLR
4.0000 10*6.[IU] | INTRAVENOUS | Status: DC
  Administered 2021-08-17 – 2021-08-18 (×5): 4 10*6.[IU] via INTRAVENOUS
  Filled 2021-08-17 (×10): qty 4

## 2021-08-17 MED ORDER — THIAMINE HCL 100 MG PO TABS
100.0000 mg | ORAL_TABLET | Freq: Every day | ORAL | Status: DC
  Administered 2021-08-25 – 2021-08-28 (×4): 100 mg via ORAL
  Filled 2021-08-17 (×5): qty 1

## 2021-08-17 MED ORDER — IOHEXOL 9 MG/ML PO SOLN
ORAL | Status: AC
  Administered 2021-08-17: 1000 mL
  Filled 2021-08-17: qty 1000

## 2021-08-17 MED ORDER — THIAMINE HCL 100 MG/ML IJ SOLN
500.0000 mg | Freq: Three times a day (TID) | INTRAVENOUS | Status: AC
  Administered 2021-08-17 – 2021-08-19 (×9): 500 mg via INTRAVENOUS
  Filled 2021-08-17 (×10): qty 5

## 2021-08-17 MED ORDER — THIAMINE HCL 100 MG/ML IJ SOLN
250.0000 mg | Freq: Every day | INTRAVENOUS | Status: AC
  Administered 2021-08-20 – 2021-08-24 (×5): 250 mg via INTRAVENOUS
  Filled 2021-08-17 (×5): qty 2.5

## 2021-08-17 NOTE — TOC Progression Note (Addendum)
Transition of Care [facility]) - Progression Note    Patient Details  Name: Frederick [name] MRN: [id number] Date of Birth: 1960-12-20  Transition of Care [facility]) CM/SW Contact  Mearl Latin, LCSW Phone Number: 08/17/2021, 9:46 AM  Clinical Narrative:    CSW continuing to follow for SNF placement. Will complete Fl2 once cortrak removed. CSW also received consult for substance use counseling however, patient not oriented enough to participate in resources.    Expected Discharge Plan: Skilled Nursing Facility Barriers to Discharge: Continued Medical Work up  Expected Discharge Plan and Services Expected Discharge Plan: Skilled Nursing Facility   Discharge Planning Services: CM Consult   Living arrangements for the past 2 months: Single Family Home                                       Social Determinants of Health (SDOH) Interventions    Readmission Risk Interventions No flowsheet data found.

## 2021-08-17 NOTE — Progress Notes (Signed)
Updated significant other that he would be transferred to [facility] Room 10 after CT.

## 2021-08-17 NOTE — Progress Notes (Signed)
Progress Note  Patient Name: Frederick [name] Date of Encounter: 08/17/2021  [facility] Cardiologist: Dr. Peter Martinique  Subjective   Patient alert and communicative, denies symptoms  Inpatient Medications    Scheduled Meds:  carvedilol  6.25 mg Per Tube BID WC   chlorhexidine gluconate (MEDLINE KIT)  15 mL Mouth Rinse BID   Chlorhexidine Gluconate Cloth  6 each Topical Daily   enoxaparin (LOVENOX) injection  40 mg Subcutaneous Q24H   feeding supplement (PROSource TF)  45 mL Per Tube BID   folic acid  1 mg Per Tube Daily   guaiFENesin  5 mL Per Tube Q6H   insulin aspart  0-9 Units Subcutaneous Q4H   levETIRAcetam  1,000 mg Per Tube BID   multivitamin with minerals  1 tablet Per Tube Daily   [START ON 08/25/2021] thiamine  100 mg Oral Daily   Continuous Infusions:  sodium chloride 5 mL/hr at 08/17/21 0700   sodium chloride 5 mL/hr at 08/17/21 0700   cefTRIAXone (ROCEPHIN)  IV Stopped (08/16/21 1252)   feeding supplement (OSMOLITE 1.5 CAL) 1,000 mL (08/17/21 1043)   thiamine injection 500 mg (08/17/21 0959)   Followed by   Derrill Memo ON 08/20/2021] thiamine injection     PRN Meds: sodium chloride, sodium chloride, docusate, ipratropium-albuterol, labetalol, LORazepam **OR** LORazepam, ondansetron (ZOFRAN) IV, polyethylene glycol   Vital Signs    Vitals:   08/17/21 0620 08/17/21 0700 08/17/21 0728 08/17/21 0826  BP:  116/77  115/77  Pulse: (!) 104 (!) 104  (!) 104  Resp:  (!) 25    Temp:   99.5 F (37.5 C)   TempSrc:   Axillary   SpO2:  100%    Weight:      Height:        Intake/Output Summary (Last 24 hours) at 08/17/2021 1057 Last data filed at 08/17/2021 0800 Gross per 24 hour  Intake 1877.66 ml  Output 850 ml  Net 1027.66 ml   Last 3 Weights 08/17/2021 08/10/2021 08/10/2021  Weight (lbs) 171 lb 15.3 oz 160 lb 11.5 oz 165 lb 5.5 oz  Weight (kg) 78 kg 72.9 kg 75 kg      Telemetry    Sinus tachycardia- Personally Reviewed  ECG    Not performed today-  Personally Reviewed  Physical Exam   GEN: No acute distress.   Neck: No JVD Cardiac: RRR, no murmurs, rubs, or gallops.  Respiratory: Clear to auscultation bilaterally. GI: Soft, nontender, non-distended  MS: No edema; No deformity. Neuro:  Nonfocal  Psych: Normal affect   Labs    High Sensitivity Troponin:  No results for input(s): TROPONINIHS in the last 720 hours.    Chemistry Recent Labs  Lab 08/14/21 1220 08/15/21 0431 08/16/21 0541  NA 133* 133* 132*  K 3.5 3.5 4.2  CL 96* 97* 99  CO2 '29 27 26  ' GLUCOSE 106* 132* 122*  BUN '9 11 14  ' CREATININE 0.77 0.77 0.81  CALCIUM 8.8* 9.0 8.6*  GFRNONAA >60 >60 >60  ANIONGAP '8 9 7     ' Hematology Recent Labs  Lab 08/14/21 1220 08/15/21 0431 08/16/21 0541  WBC 8.3 6.7 8.2  RBC 3.95* 3.92* 3.64*  HGB 12.3* 12.3* 11.6*  HCT 37.0* 35.7* 33.5*  MCV 93.7 91.1 92.0  MCH 31.1 31.4 31.9  MCHC 33.2 34.5 34.6  RDW 14.6 14.5 14.2  PLT 196 208 199    BNP Recent Labs  Lab 08/10/21 1537  BNP 401.9*     DDimer No  results for input(s): DDIMER in the last 168 hours.   Radiology    No results found.  Cardiac Studies   Transesophageal echo (08/14/2021)  IMPRESSIONS     1. Left ventricular ejection fraction, by estimation, is 50 to 55%. The  left ventricle has low normal function.   2. Right ventricular systolic function is normal. The right ventricular  size is normal.   3. Left atrial size was mildly dilated. No left atrial/left atrial  appendage thrombus was detected.   4. The mitral valve is normal in structure. No evidence of mitral valve  regurgitation.   5. The aortic valve is tricuspid. Aortic valve regurgitation is not  visualized. No aortic stenosis is present.   6. Evidence of atrial level shunting detected by color flow Doppler.  There is a small patent foramen ovale with predominantly left to right  shunting across the atrial septum.  Patient Profile     60 y.o. 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 Carlis Abbott.  Assessment & Plan    1: Left ventricular dysfunction-initial 2D echocardiogram performed 08/10/2021 revealed an EF of 25 to 30%.  Transesophageal echo performed 4 days later because of bacteremia looking for endocarditis revealed an EF of 55%.  2: Sinus tachycardia-on carvedilol 6.25 mg p.o. twice daily.  Can titrate based on blood pressure.  Heart rate is in the low 100 range.  Given normal EF there is no need for an ACE/ARB.  He appears dry on exam.  3: Bacteremia-no evidence of endocarditis on TEE.  At this point, his major problem is alcohol withdrawal seizure bacteremia.  Does have an NG tube in.  He is minimally communicative.  No further cardiovascular evaluation is warranted at this time.  We will sign off and see as needed.  [facility] will sign off.   Medication Recommendations: Titrate beta-blocker to heart rate and blood pressure Other recommendations (labs, testing, etc): No further Testing required Follow up as an outpatient: No outpatient follow-up necessary  For questions or updates, please contact [place] Please consult [email or web address] for contact info under        Signed, Quay Burow, MD  08/17/2021, 10:57 AM

## 2021-08-17 NOTE — Progress Notes (Signed)
PROGRESS NOTE    Frederick [name]  SUP:[id number] DOB: 1961/06/30 DOA: 08/10/2021 PCP: [name]   Chief Complaint  Patient presents with   Seizures   Brief Narrative:  60 yo M with hx of tobacco abuse, etoh abuse, hx TB s/p treatment (in 2001 and 2016)  and multiple other medical problems who presented with 1 week of focal recurrent seizure activity.  EMS was called by wife 2/2 concern for stroke.  On EMS arrival he was actively seizing with generalized tonic clonic seizure.  He was brought to the ED and required intubation for airway protection.   He was admitted to the ICU.  Neurology was consulted for status epilepticus in the setting of etoh withdrawal.  He's being continued on keppra until outpatient follow up.  MRI was limited by motion, but without notable abnormalities.  Hospitalization was complicated by concern for aspiration pneumonia as well as strep anginosis bacteremia.  Frederick [name] TEE was negative for vegetations.  An echo at admission showed reduced EF of 25-30%, which was improved to 50-55% on the TEE.  Cardiology has followed, now signed off.   He's currently receiving tube feeds by cortrak.  He continues to be encephalopathic.  ID has been consulted for his bacteremia.    TRH following as of 9/12.   Assessment & Plan:   Active Problems:   Seizures (HCC)   Status epilepticus (HCC)   Malnutrition of moderate degree   Bacteremia due to group B Streptococcus   Acute systolic CHF (congestive heart failure) (HCC)  Status Epilepticus EEG on 9/5 withotu seizures or epileptiform discharges Overnight EEG suggestive of severe diffuse encephalopathy Continue keppra 1000 mg BID  Neurology was c/s, now signed off - recommending continue keppra 100 mg BID, can likely be weaned in [place] few months given provoked in setting of etoh withdrawal PRN versed for seizure like activity Follow with neurology in 6-8 weeks  Strep Anginosis Bacteremia  Concern for aspiration  pneumonia Transition to penicillin  Blood cx from 9/5 with strep anginosus Repeat blood cx from 9/7 NGTD Follow CT chest/abdomen/pelvis as well as max face to eval for abscesses ID consulted Temp has been elevated - last true temp appears to have been 9/11 at 8 am  Etoh Withdrawal  He's still altered CIWA with prn ativan  Continue to monitor   Acute Metabolic Encephalopathy High dose thiamine - ? Wernickes MRI motion limited, without obvious acute abnormality Follow Tsh, vitamin b12, folate, RPR Delirium precautions W/u further as indicated  HFrEF  Sinus Tachycardia EF on Echo 25-30% TEE with EF 50-55% Continue coreg Per cards, no need for ace/arb based on normal EF  Bilateral Upper Extremity Edema Follow [place]  Hypertension Coreg Continue labetalol prn  Moderate protein calorie malnutrition Cortrak in place with tube feeds  A1c 4.8 On tube feeds, continue SSI for now  DVT prophylaxis: lovenox Code Status: full  Family Communication: none at bedside Disposition:   Status is: Inpatient  Remains inpatient appropriate because:Inpatient level of care appropriate due to severity of illness  Dispo: The patient is from: Home              Anticipated d/c is to:  pending              Patient currently is not medically stable to d/c.   Difficult to place patient No       Consultants:  PCCM Cardiology  ID  Procedures:  Echo IMPRESSIONS     1. Left ventricular  ejection fraction, by estimation, is 25 to 30%. The  left ventricle has severely decreased function. The left ventricle  demonstrates regional wall motion abnormalities. Unusual pattern with the  mid LV wall segments appearing akinetic  while the apex and the basal segments contract. Possibly Kim Lauver mid-wall  Takotsubo variant. Left ventricular diastolic function could not be  evaluated.   2. Right ventricular systolic function is mildly reduced. The right  ventricular size is normal.   3. The mitral  valve is normal in structure. No evidence of mitral valve  regurgitation. No evidence of mitral stenosis.   4. The aortic valve was not well visualized. Aortic valve regurgitation  is not visualized. No aortic stenosis is present.   5. Technically difficult study with very limited images.   TEE IMPRESSIONS     1. Left ventricular ejection fraction, by estimation, is 50 to 55%. The  left ventricle has low normal function.   2. Right ventricular systolic function is normal. The right ventricular  size is normal.   3. Left atrial size was mildly dilated. No left atrial/left atrial  appendage thrombus was detected.   4. The mitral valve is normal in structure. No evidence of mitral valve  regurgitation.   5. The aortic valve is tricuspid. Aortic valve regurgitation is not  visualized. No aortic stenosis is present.   6. Evidence of atrial level shunting detected by color flow Doppler.  There is Hulen Mandler small patent foramen ovale with predominantly left to right  shunting across the atrial septum.   Antimicrobials:  Anti-infectives (From admission, onward)    Start     Dose/Rate Route Frequency Ordered Stop   08/17/21 1215  penicillin G potassium 4 Million Units in dextrose 5 % 250 mL IVPB        4 Million Units 250 mL/hr over 60 Minutes Intravenous Every 4 hours 08/17/21 1128     08/13/21 1200  cefTRIAXone (ROCEPHIN) 2 g in sodium chloride 0.9 % 100 mL IVPB  Status:  Discontinued        2 g 200 mL/hr over 30 Minutes Intravenous Every 24 hours 08/13/21 1107 08/17/21 1128   08/12/21 1800  Ampicillin-Sulbactam (UNASYN) 3 g in sodium chloride 0.9 % 100 mL IVPB  Status:  Discontinued        3 g 200 mL/hr over 30 Minutes Intravenous Every 6 hours 08/12/21 1324 08/13/21 1107   08/11/21 1000  Ampicillin-Sulbactam (UNASYN) 3 g in sodium chloride 0.9 % 100 mL IVPB  Status:  Discontinued        3 g 200 mL/hr over 30 Minutes Intravenous Every 8 hours 08/11/21 0902 08/12/21 1324           Subjective: Difficult to understand  Objective: Vitals:   08/17/21 1100 08/17/21 1119 08/17/21 1200 08/17/21 1300  BP: 103/71  98/79 101/65  Pulse: 99  (!) 101 (!) 105  Resp: (!) 23  (!) 22 (!) 26  Temp:  99.1 F (37.3 C)    TempSrc:  Oral    SpO2: 100%  100% 100%  Weight:      Height:        Intake/Output Summary (Last 24 hours) at 08/17/2021 1457 Last data filed at 08/17/2021 1100 Gross per 24 hour  Intake 1647.6 ml  Output 700 ml  Net 947.6 ml   Filed Weights   08/10/21 1000 08/10/21 1627 08/17/21 0500  Weight: 75 kg 72.9 kg 78 kg    Examination:  General exam: Appears calm and comfortable  Respiratory system: unlabored Cardiovascular system: RRR Gastrointestinal system: Abdomen is nondistended, soft and nontender - NG place Central nervous system: disoriented, difficult to understand - follows commands intermittently.  Extremities: bilateral upper extremity edema L>R Skin: healing laceration on R - no fluctuance below     Data Reviewed: I have personally reviewed following labs and imaging studies  CBC: Recent Labs  Lab 08/11/21 0447 08/11/21 0508 08/14/21 1220 08/15/21 0431 08/16/21 0541  WBC 11.1*  --  8.3 6.7 8.2  HGB 14.2 12.9* 12.3* 12.3* 11.6*  HCT 42.5 38.0* 37.0* 35.7* 33.5*  MCV 95.7  --  93.7 91.1 92.0  PLT 161  --  196 208 098    Basic Metabolic Panel: Recent Labs  Lab 08/12/21 0453 08/13/21 0347 08/14/21 0458 08/14/21 1220 08/14/21 1752 08/15/21 0431 08/15/21 1655 08/16/21 0541 08/16/21 1703  NA 135 135  --  133*  --  133*  --  132*  --   K 3.2* 3.8  --  3.5  --  3.5  --  4.2  --   CL 101 99  --  96*  --  97*  --  99  --   CO2 25 27  --  29  --  27  --  26  --   GLUCOSE 117* 96  --  106*  --  132*  --  122*  --   BUN 10 7*  --  9  --  11  --  14  --   CREATININE 0.93 0.82  --  0.77  --  0.77  --  0.81  --   CALCIUM 8.4* 8.8*  --  8.8*  --  9.0  --  8.6*  --   MG 1.8 1.7   < >  --  2.1 1.8 1.9 1.7 2.0  PHOS 3.9 3.5   <  >  --  3.4 3.9 3.6 3.6 3.6   < > = values in this interval not displayed.    GFR: Estimated Creatinine Clearance: 105.7 mL/min (by C-G formula based on SCr of 0.81 mg/dL).  Liver Function Tests: No results for input(s): AST, ALT, ALKPHOS, BILITOT, PROT, ALBUMIN in the last 168 hours.  CBG: Recent Labs  Lab 08/16/21 2011 08/16/21 2315 08/17/21 0311 08/17/21 0701 08/17/21 1118  GLUCAP 124* 92 96 123* 106*     Recent Results (from the past 240 hour(s))  Resp Panel by RT-PCR (Flu Marcello Tuzzolino&B, Covid) Nasopharyngeal Swab     Status: None   Collection Time: 08/10/21 12:15 PM   Specimen: Nasopharyngeal Swab; Nasopharyngeal(NP) swabs in vial transport medium  Result Value Ref Range Status   SARS Coronavirus 2 by RT PCR NEGATIVE NEGATIVE Final    Comment: (NOTE) SARS-CoV-2 target nucleic acids are NOT DETECTED.  The SARS-CoV-2 RNA is generally detectable in upper respiratory specimens during the acute phase of infection. The lowest concentration of SARS-CoV-2 viral copies this assay can detect is 138 copies/mL. Starlit Raburn negative result does not preclude SARS-Cov-2 infection and should not be used as the sole basis for treatment or other patient management decisions. Tayvian Holycross negative result may occur with  improper specimen collection/handling, submission of specimen other than nasopharyngeal swab, presence of viral mutation(s) within the areas targeted by this assay, and inadequate number of viral copies(<138 copies/mL). Ife Vitelli negative result must be combined with clinical observations, patient history, and epidemiological information. The expected result is Negative.  Fact Sheet for Patients:  EntrepreneurPulse.com.au  Fact Sheet for Healthcare Providers:  IncredibleEmployment.be  This  test is no t yet approved or cleared by the [place] and  has been authorized for detection and/or diagnosis of SARS-CoV-2 by FDA under an Emergency Use Authorization  (EUA). This EUA will remain  in effect (meaning this test can be used) for the duration of the COVID-19 declaration under Section 564(b)(1) of the Act, 21 U.S.C.section 360bbb-3(b)(1), unless the authorization is terminated  or revoked sooner.       Influenza Olivine Hiers by PCR NEGATIVE NEGATIVE Final   Influenza B by PCR NEGATIVE NEGATIVE Final    Comment: (NOTE) The Xpert Xpress SARS-CoV-2/FLU/RSV plus assay is intended as an aid in the diagnosis of influenza from Nasopharyngeal swab specimens and should not be used as Dezaria Methot sole basis for treatment. Nasal washings and aspirates are unacceptable for Xpert Xpress SARS-CoV-2/FLU/RSV testing.  Fact Sheet for Patients: EntrepreneurPulse.com.au  Fact Sheet for Healthcare Providers: IncredibleEmployment.be  This test is not yet approved or cleared by the [place] FDA and has been authorized for detection and/or diagnosis of SARS-CoV-2 by FDA under an Emergency Use Authorization (EUA). This EUA will remain in effect (meaning this test can be used) for the duration of the COVID-19 declaration under Section 564(b)(1) of the Act, 21 U.S.C. section 360bbb-3(b)(1), unless the authorization is terminated or revoked.  Performed at [facility], [place] [address] [place], [place] [address]   Culture, blood (routine x 2)     Status: Abnormal   Collection Time: 08/10/21  2:00 PM   Specimen: BLOOD LEFT HAND  Result Value Ref Range Status   Specimen Description BLOOD LEFT HAND  Final   Special Requests   Final    BOTTLES DRAWN AEROBIC ONLY Blood Culture results may not be optimal due to an inadequate volume of blood received in culture bottles   Culture  Setup Time   Final    GRAM POSITIVE COCCI IN CHAINS AEROBIC BOTTLE ONLY CRITICAL RESULT CALLED TO, READ BACK BY AND VERIFIED WITH: V BRYK,PHARMD'@0110'  08/12/21 [place] Performed at [facility], [place] [address] [place], [place] [address]    Culture  STREPTOCOCCUS ANGINOSIS (Ulysee Fyock)  Final   Report Status 08/14/2021 FINAL  Final   Organism ID, Bacteria STREPTOCOCCUS ANGINOSIS  Final      Susceptibility   Streptococcus anginosis - MIC*    PENICILLIN 0.12 SENSITIVE Sensitive     CEFTRIAXONE 0.5 SENSITIVE Sensitive     ERYTHROMYCIN >=8 RESISTANT Resistant     LEVOFLOXACIN 1 SENSITIVE Sensitive     VANCOMYCIN 0.5 SENSITIVE Sensitive     * STREPTOCOCCUS ANGINOSIS  Blood Culture ID Panel (Reflexed)     Status: Abnormal   Collection Time: 08/10/21  2:00 PM  Result Value Ref Range Status   Enterococcus faecalis NOT DETECTED NOT DETECTED Final   Enterococcus Faecium NOT DETECTED NOT DETECTED Final   Listeria monocytogenes NOT DETECTED NOT DETECTED Final   Staphylococcus species NOT DETECTED NOT DETECTED Final   Staphylococcus aureus (BCID) NOT DETECTED NOT DETECTED Final   Staphylococcus epidermidis NOT DETECTED NOT DETECTED Final   Staphylococcus lugdunensis NOT DETECTED NOT DETECTED Final   Streptococcus species DETECTED (Quintina Hakeem) NOT DETECTED Final    Comment: Not Enterococcus species, Streptococcus agalactiae, Streptococcus pyogenes, or Streptococcus pneumoniae. CRITICAL RESULT CALLED TO, READ BACK BY AND VERIFIED WITH: V BRYK,PHARMD'@0110'  08/12/21 [place]    Streptococcus agalactiae NOT DETECTED NOT DETECTED Final   Streptococcus pneumoniae NOT DETECTED NOT DETECTED Final   Streptococcus pyogenes NOT DETECTED NOT DETECTED Final   Donnel Venuto.calcoaceticus-baumannii NOT  DETECTED NOT DETECTED Final   Bacteroides fragilis NOT DETECTED NOT DETECTED Final   Enterobacterales NOT DETECTED NOT DETECTED Final   Enterobacter cloacae complex NOT DETECTED NOT DETECTED Final   Escherichia coli NOT DETECTED NOT DETECTED Final   Klebsiella aerogenes NOT DETECTED NOT DETECTED Final   Klebsiella oxytoca NOT DETECTED NOT DETECTED Final   Klebsiella pneumoniae NOT DETECTED NOT DETECTED Final   Proteus species NOT DETECTED NOT DETECTED Final   Salmonella species NOT DETECTED  NOT DETECTED Final   Serratia marcescens NOT DETECTED NOT DETECTED Final   Haemophilus influenzae NOT DETECTED NOT DETECTED Final   Neisseria meningitidis NOT DETECTED NOT DETECTED Final   Pseudomonas aeruginosa NOT DETECTED NOT DETECTED Final   Stenotrophomonas maltophilia NOT DETECTED NOT DETECTED Final   Candida albicans NOT DETECTED NOT DETECTED Final   Candida auris NOT DETECTED NOT DETECTED Final   Candida glabrata NOT DETECTED NOT DETECTED Final   Candida krusei NOT DETECTED NOT DETECTED Final   Candida parapsilosis NOT DETECTED NOT DETECTED Final   Candida tropicalis NOT DETECTED NOT DETECTED Final   Cryptococcus neoformans/gattii NOT DETECTED NOT DETECTED Final    Comment: Performed at [facility], [place] [address], [place], [place] [address]  MRSA Next Gen by PCR, Nasal     Status: None   Collection Time: 08/10/21  4:13 PM   Specimen: Urine, Clean Catch; Nasal Swab  Result Value Ref Range Status   MRSA by PCR Next Gen NOT DETECTED NOT DETECTED Final    Comment: (NOTE) The GeneXpert MRSA Assay (FDA approved for NASAL specimens only), is one component of Agnes Probert comprehensive MRSA colonization surveillance program. It is not intended to diagnose MRSA infection nor to guide or monitor treatment for MRSA infections. Test performance is not FDA approved in patients less than 62 years old. Performed at [facility], [place] [address] [place], [place] [address]   Culture, blood (routine x 2)     Status: Abnormal   Collection Time: 08/10/21  4:21 PM   Specimen: BLOOD  Result Value Ref Range Status   Specimen Description BLOOD SITE NOT SPECIFIED  Final   Special Requests   Final    BOTTLES DRAWN AEROBIC AND ANAEROBIC Blood Culture results may not be optimal due to an inadequate volume of blood received in culture bottles   Culture  Setup Time   Final    GRAM POSITIVE COCCI IN CHAINS AEROBIC BOTTLE ONLY CRITICAL VALUE NOTED.  VALUE IS CONSISTENT WITH PREVIOUSLY REPORTED AND  CALLED VALUE.    Culture (Reshonda Koerber)  Final    STREPTOCOCCUS ANGINOSIS SUSCEPTIBILITIES PERFORMED ON PREVIOUS CULTURE WITHIN THE LAST 5 DAYS. Performed at [facility], [place] [address], [place], [place] [address]    Report Status 08/14/2021 FINAL  Final  Culture, Respiratory w Gram Stain     Status: None   Collection Time: 08/11/21  8:09 AM   Specimen: Tracheal Aspirate; Respiratory  Result Value Ref Range Status   Specimen Description TRACHEAL ASPIRATE  Final   Special Requests NONE  Final   Gram Stain   Final    FEW WBC PRESENT,BOTH PMN AND MONONUCLEAR NO ORGANISMS SEEN    Culture   Final    Normal respiratory flora-no Staph aureus or Pseudomonas seen Performed at [facility], [address] [place], [place] [address]    Report Status 08/13/2021 FINAL  Final  Culture, blood (routine x 2)     Status: None   Collection Time:  08/12/21  7:57 AM   Specimen: BLOOD  Result Value Ref Range Status   Specimen Description BLOOD RIGHT ANTECUBITAL  Final   Special Requests   Final    BOTTLES DRAWN AEROBIC AND ANAEROBIC Blood Culture results may not be optimal due to an excessive volume of blood received in culture bottles   Culture   Final    NO GROWTH 5 DAYS Performed at [facility], [place] [address], [place], [place] [address]    Report Status 08/17/2021 FINAL  Final  Culture, blood (routine x 2)     Status: None   Collection Time: 08/12/21  8:14 AM   Specimen: BLOOD LEFT HAND  Result Value Ref Range Status   Specimen Description BLOOD LEFT HAND  Final   Special Requests   Final    BOTTLES DRAWN AEROBIC AND ANAEROBIC Blood Culture adequate volume   Culture   Final    NO GROWTH 5 DAYS Performed at [facility], [place] [address], [place], [place] [address]    Report Status 08/17/2021 FINAL  Final         Radiology Studies: No results found.      Scheduled Meds:  carvedilol  6.25 mg Per Tube BID WC   chlorhexidine gluconate (MEDLINE KIT)  15 mL Mouth  Rinse BID   Chlorhexidine Gluconate Cloth  6 each Topical Daily   enoxaparin (LOVENOX) injection  40 mg Subcutaneous Q24H   feeding supplement (PROSource TF)  45 mL Per Tube BID   folic acid  1 mg Per Tube Daily   guaiFENesin  5 mL Per Tube Q6H   insulin aspart  0-9 Units Subcutaneous Q4H   iohexol       levETIRAcetam  1,000 mg Per Tube BID   multivitamin with minerals  1 tablet Per Tube Daily   [START ON 08/25/2021] thiamine  100 mg Oral Daily   Continuous Infusions:  sodium chloride 5 mL/hr at 08/17/21 0700   sodium chloride 5 mL/hr at 08/17/21 0700   feeding supplement (OSMOLITE 1.5 CAL) 1,000 mL (08/17/21 1043)   pencillin G potassium IV 4 Million Units (08/17/21 1243)   thiamine injection 500 mg (08/17/21 0959)   Followed by   Derrill Memo ON 08/20/2021] thiamine injection       LOS: 7 days    Time spent: over 3 min    Fayrene Helper, MD [facility]   To contact the attending provider between 7A-7P or the covering provider during after hours 7P-7A, please log into the web site [email or web address] and access using universal [place] password for that web site. If you do not have the password, please call the hospital operator.  08/17/2021, 2:57 PM

## 2021-08-17 NOTE — Progress Notes (Signed)
SLP Cancellation Note  Patient Details Name: Frederick [name] MRN: [id number] DOB: [birth year]   Cancelled treatment:        Checked on pt this am. Observed RN interacting with pt- he is more awake but having difficulty attending and following commands. Wet vocal quality indicative of continued decreased secretion management. Will plan to see tomorrow.    Royce Macadamia 08/17/2021, 11:30 AM  Breck Coons Lonell Face.Ed Nurse, children's [phone] Office [phone]

## 2021-08-17 NOTE — Progress Notes (Signed)
OT Cancellation Note  Patient Details Name: Frederick [name] MRN: [id number] DOB: 16-Dec-1960   Cancelled Treatment:    Reason Eval/Treat Not Completed: Medical issues which prohibited therapy.  MD in room as OT entering. MD stated plan to [place] BUEs to r/o DVT and agrees that OT should hold until results of [place] available. Will continue efforts.   Theodoro Clock 08/17/2021, 1:28 PM

## 2021-08-18 ENCOUNTER — Inpatient Hospital Stay (HOSPITAL_COMMUNITY): Payer: Medicare Other

## 2021-08-18 DIAGNOSIS — R569 Unspecified convulsions: Secondary | ICD-10-CM | POA: Diagnosis not present

## 2021-08-18 DIAGNOSIS — R7881 Bacteremia: Secondary | ICD-10-CM | POA: Diagnosis not present

## 2021-08-18 DIAGNOSIS — R609 Edema, unspecified: Secondary | ICD-10-CM | POA: Diagnosis not present

## 2021-08-18 DIAGNOSIS — Z8611 Personal history of tuberculosis: Secondary | ICD-10-CM

## 2021-08-18 DIAGNOSIS — B951 Streptococcus, group B, as the cause of diseases classified elsewhere: Secondary | ICD-10-CM | POA: Diagnosis not present

## 2021-08-18 LAB — GLUCOSE, CAPILLARY
Glucose-Capillary: 79 mg/dL (ref 70–99)
Glucose-Capillary: 48 mg/dL — ABNORMAL LOW (ref 70–99)
Glucose-Capillary: 95 mg/dL (ref 70–99)
Glucose-Capillary: 104 mg/dL — ABNORMAL HIGH (ref 70–99)
Glucose-Capillary: 55 mg/dL — ABNORMAL LOW (ref 70–99)
Glucose-Capillary: 83 mg/dL (ref 70–99)
Glucose-Capillary: 122 mg/dL — ABNORMAL HIGH (ref 70–99)
Glucose-Capillary: 127 mg/dL — ABNORMAL HIGH (ref 70–99)

## 2021-08-18 LAB — COMPREHENSIVE METABOLIC PANEL
ALT: 35 U/L (ref 0–44)
AST: 56 U/L — ABNORMAL HIGH (ref 15–41)
Albumin: 1.6 g/dL — ABNORMAL LOW (ref 3.5–5.0)
Alkaline Phosphatase: 63 U/L (ref 38–126)
Anion gap: 7 (ref 5–15)
BUN: 13 mg/dL (ref 8–23)
CO2: 26 mmol/L (ref 22–32)
Calcium: 8.6 mg/dL — ABNORMAL LOW (ref 8.9–10.3)
Chloride: 94 mmol/L — ABNORMAL LOW (ref 98–111)
Creatinine, Ser: 0.77 mg/dL (ref 0.61–1.24)
GFR, Estimated: >60 mL/min (ref >60)
Glucose, Bld: 133 mg/dL — ABNORMAL HIGH (ref 70–99)
Potassium: 4.1 mmol/L (ref 3.5–5.1)
Sodium: 127 mmol/L — ABNORMAL LOW (ref 135–145)
Total Bilirubin: 0.4 mg/dL (ref 0.3–1.2)
Total Protein: 6.2 g/dL — ABNORMAL LOW (ref 6.5–8.1)

## 2021-08-18 LAB — CBC WITH DIFFERENTIAL/PLATELET
Abs Immature Granulocytes: 0.08 10*3/uL — ABNORMAL HIGH (ref 0.00–0.07)
Basophils Absolute: 0 10*3/uL (ref 0.0–0.1)
Basophils Relative: 1 %
Eosinophils Absolute: 0.2 10*3/uL (ref 0.0–0.5)
Eosinophils Relative: 4 %
HCT: 30.6 % — ABNORMAL LOW (ref 39.0–52.0)
Hemoglobin: 10.5 g/dL — ABNORMAL LOW (ref 13.0–17.0)
Immature Granulocytes: 1 %
Lymphocytes Relative: 17 %
Lymphs Abs: 1.1 10*3/uL (ref 0.7–4.0)
MCH: 31.5 pg (ref 26.0–34.0)
MCHC: 34.3 g/dL (ref 30.0–36.0)
MCV: 91.9 fL (ref 80.0–100.0)
Monocytes Absolute: 0.9 10*3/uL (ref 0.1–1.0)
Monocytes Relative: 15 %
Neutro Abs: 3.8 10*3/uL (ref 1.7–7.7)
Neutrophils Relative %: 62 %
Platelets: 238 10*3/uL (ref 150–400)
RBC: 3.33 MIL/uL — ABNORMAL LOW (ref 4.22–5.81)
RDW: 14.5 % (ref 11.5–15.5)
WBC: 6.1 10*3/uL (ref 4.0–10.5)
nRBC: 0 % (ref 0.0–0.2)

## 2021-08-18 LAB — OSMOLALITY: Osmolality: 276 mOsm/kg (ref 275–295)

## 2021-08-18 LAB — T4, FREE: Free T4: 1.03 ng/dL (ref 0.61–1.12)

## 2021-08-18 LAB — RPR: RPR Ser Ql: NONREACTIVE

## 2021-08-18 LAB — FOLATE: Folate: 13.4 ng/mL (ref >5.9)

## 2021-08-18 LAB — TSH: TSH: 10.807 u[IU]/mL — ABNORMAL HIGH (ref 0.350–4.500)

## 2021-08-18 LAB — MAGNESIUM: Magnesium: 1.6 mg/dL — ABNORMAL LOW (ref 1.7–2.4)

## 2021-08-18 LAB — HEPARIN LEVEL (UNFRACTIONATED): Heparin Unfractionated: 0.19 IU/mL — ABNORMAL LOW (ref 0.30–0.70)

## 2021-08-18 LAB — PHOSPHORUS: Phosphorus: 3.9 mg/dL (ref 2.5–4.6)

## 2021-08-18 LAB — VITAMIN B12: Vitamin B-12: 341 pg/mL (ref 180–914)

## 2021-08-18 LAB — SODIUM, URINE, RANDOM: Sodium, Ur: 64 mmol/L

## 2021-08-18 MED ORDER — HEPARIN BOLUS VIA INFUSION
2000.0000 [IU] | Freq: Once | INTRAVENOUS | Status: AC
  Administered 2021-08-18: 2000 [IU] via INTRAVENOUS
  Filled 2021-08-18: qty 2000

## 2021-08-18 MED ORDER — LINEZOLID 600 MG PO TABS
600.0000 mg | ORAL_TABLET | Freq: Two times a day (BID) | ORAL | Status: DC
  Administered 2021-08-18 – 2021-08-22 (×10): 600 mg via ORAL
  Filled 2021-08-18 (×11): qty 1

## 2021-08-18 MED ORDER — HEPARIN (PORCINE) 25000 UT/250ML-% IV SOLN
1750.0000 [IU]/h | INTRAVENOUS | Status: DC
  Administered 2021-08-18: 1300 [IU]/h via INTRAVENOUS
  Administered 2021-08-19: 1550 [IU]/h via INTRAVENOUS
  Administered 2021-08-20 – 2021-08-26 (×8): 1850 [IU]/h via INTRAVENOUS
  Filled 2021-08-18 (×13): qty 250

## 2021-08-18 MED ORDER — HEPARIN BOLUS VIA INFUSION
4000.0000 [IU] | Freq: Once | INTRAVENOUS | Status: AC
  Administered 2021-08-18: 4000 [IU] via INTRAVENOUS
  Filled 2021-08-18: qty 4000

## 2021-08-18 MED ORDER — LINEZOLID 100 MG/5ML PO SUSR
600.0000 mg | Freq: Two times a day (BID) | ORAL | Status: DC
  Filled 2021-08-18: qty 30

## 2021-08-18 MED ORDER — INFLUENZA VAC SPLIT QUAD 0.5 ML IM SUSY
0.5000 mL | PREFILLED_SYRINGE | INTRAMUSCULAR | Status: AC
  Administered 2021-08-19: 0.5 mL via INTRAMUSCULAR
  Filled 2021-08-18: qty 0.5

## 2021-08-18 MED ORDER — MAGNESIUM SULFATE 2 GM/50ML IV SOLN
2.0000 g | Freq: Once | INTRAVENOUS | Status: AC
  Administered 2021-08-18: 2 g via INTRAVENOUS
  Filled 2021-08-18: qty 50

## 2021-08-18 MED ORDER — PNEUMOCOCCAL VAC POLYVALENT 25 MCG/0.5ML IJ INJ
0.5000 mL | INJECTION | INTRAMUSCULAR | Status: AC
  Administered 2021-08-19: 0.5 mL via INTRAMUSCULAR
  Filled 2021-08-18: qty 0.5

## 2021-08-18 MED ORDER — DEXTROSE 50 % IV SOLN
INTRAVENOUS | Status: AC
  Filled 2021-08-18: qty 50

## 2021-08-18 NOTE — Evaluation (Signed)
Speech Language Pathology Evaluation Patient Details Name: Frederick [name] MRN: [id number] DOB: 1961/11/17 Today's Date: 08/18/2021 Time: 0950-1010 SLP Time Calculation (min) (ACUTE ONLY): 20 min  Problem List:  Patient Active Problem List   Diagnosis Date Noted   Malnutrition of moderate degree 08/12/2021   Bacteremia due to group B Streptococcus    Acute systolic CHF (congestive heart failure) (HCC)    Seizures (HCC) 08/10/2021   Status epilepticus (HCC)    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   Osteomyelitis of index finger of right hand (HCC) 05/28/2021   Marijuana abuse 07/22/2017   Hyponatremia 12/03/2015   Left rib fracture 11/29/2015   Microcytic anemia 11/29/2015   Cigarette smoker 11/29/2015   Protein-calorie malnutrition, severe (HCC) 11/29/2015   Unintentional weight loss 11/29/2015   History of tuberculosis 11/28/2015   Cavitary lesion of lung 11/28/2015   SBO (small bowel obstruction) (HCC) 03/10/2013   Abdominal pain, diffuse 03/10/2013   Nausea and vomiting 03/10/2013   Fracture of fibula, proximal 01/21/2013   Closed fracture of left distal distal tibia 01/21/2013   EtOH dependence (HCC)    Fibula fracture    Hernia    Past Medical History:  Past Medical History:  Diagnosis Date   Arthritis    Asthma    Bilateral lower extremity edema 05/28/2021   propping feet up   Cough 05/28/2021   with clear sputum   Dyspnea    Dyspnea 05/28/2021   with exertion   EtOH dependence (HCC)    drinks 2-3 of 40ounes a day   Fibula fracture    Dr Dion Saucier 02-2011   Finger osteomyelitis, right (HCC) 05/26/2021   right index finger   Hernia    surgery 01-17-13   Hypertension    not taking bp meds since may 2022   Tuberculosis    couple of yrs ago took tx at [facility] pt on 05-28-2021   Past Surgical History:  Past Surgical History:  Procedure Laterality Date   AMPUTATION Right 05/29/2021   Procedure: PARTIAL  RIGHT 2ND FINGER AMPUTATION;  Surgeon: Kathryne Hitch, MD;  Location: [facility];  Service: Orthopedics;  Laterality: Right;   ANKLE SURGERY     INGUINAL HERNIA REPAIR Right 01/19/2013   Procedure: HERNIA REPAIR INGUINAL ADULT;  Surgeon: [name], MD,FACS;  Location: [facility] OR;  Service: General;  Laterality: Right;   TEE WITHOUT CARDIOVERSION N/A 08/14/2021   Procedure: TRANSESOPHAGEAL ECHOCARDIOGRAM (TEE);  Surgeon: Wendall Stade, MD;  Location: [facility] ENDOSCOPY;  Service: Cardiovascular;  Laterality: N/A;   TIBIA IM NAIL INSERTION Left 01/22/2013   Procedure: INTRAMEDULLARY (IM) NAIL TIBIAL;  Surgeon: [name], MD;  Location: [facility] OR;  Service: Orthopedics;  Laterality: Left;   HPI:  Frederick [name] is a 60 y.o. male with tobacco abuse, ETOH abuse (significantly > 80 oz beer per day, liquor per wife). Admitted with 1 week history of recurrent focal seizure activity. CT atrophy with small vessel chronic ischemic changes of deep cerebral white matter. 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 Required intubated 9/5-9/7. Other PMH: TBI x 2 (2001, 2016), asthma, cough.   Assessment / Plan / Recommendation Clinical Impression  The Mini-Mental State Examination (MMSE) was administered. Pt scored 13/30, indicating significant cognitive impairment. Deficits were noted in orientation, attention/calculation (problem solving), delayed recall, following complex verbal directions, and executive functions. No family present to discuss baseline level of cognition. SLP will continue to follow  acutely to address cognitive impairments including orientation, attention, memory, and functional problem solving. If mentation does not improve significantly, 24 hour supervision will be needed for safety.    SLP Assessment  SLP Recommendation/Assessment: Patient needs continued Speech Language Pathology Services  SLP Visit Diagnosis: Cognitive communication deficit (R41.841)     Follow Up Recommendations  Other (comment) (TBD)    Frequency and Duration min 1 x/week  2 weeks      SLP Evaluation Cognition  Overall Cognitive Status: No family/caregiver present to determine baseline cognitive functioning Arousal/Alertness: Awake/alert Orientation Level: Oriented to person;Disoriented to time;Disoriented to place;Disoriented to situation Memory: Impaired Memory Impairment: Retrieval deficit;Decreased recall of new information;Decreased short term memory       Comprehension  Auditory Comprehension Overall Auditory Comprehension: Appears within functional limits for tasks assessed    Expression Expression Primary Mode of Expression: Verbal Verbal Expression Overall Verbal Expression: Appears within functional limits for tasks assessed   Oral / Motor  Oral Motor/Sensory Function Overall Oral Motor/Sensory Function: Within functional limits Motor Speech Overall Motor Speech: Appears within functional limits for tasks assessed   GO                   Frederick [name], [facility], CCC-SLP Speech Language Pathologist Office: [phone]  Leigh Aurora 08/18/2021, 10:25 AM

## 2021-08-18 NOTE — Consult Note (Signed)
I have seen the patient and reviewed and agreed with the history/physical exam finding/labs with our our resident dr Ruben Im, and I agreed with the assessment/plan unless otherwise noted  Please see her note for detailed hpi   60 yo male with hx pulm tb s/p treatment several years prior to this admission, residual rul cavitary changes/scar, etoh/smoker, admitted after a generalized tonic-cloni seizure in setting decreased etoh intake, found to have high burden strep anginosus bsi  Tee negative for endocarditis Abd pelv ct no occult abscess Head ct no cns abscess  Patient mentation improving  Patient clinically improving with appropriate abx which is now penicillin   Exam: In mitt restraint Alert/oriented, appropriate history interaction Normocephalic; conj clear Skin no rash Cv rrr no mrg Lungs clear Abd soft Ext no edema Neuro generalized weakness but no obvious focal deficit   Labs and imaging reviewed   A/p Strep angi bacteremia unclear source Etoh abuse with gtc seizure and metabolic encephalopathy -- improving Hx pulm tb s/p tx 2016 residual pulm ct changes  Initially concern for encephalopathy with hx pulm tb but appears unrelated Bacteremia clinically well treated. Would finish 2 week course of treatment  -F/u pcp for ongoing monitoring yearly b-symptoms/cough and consideration of repeat chest ct in 6-8 weeks. Would defer further workup if no sepsis syndrome or b-sx or dyspnea progression otherwise -stop penicillin -start linezolid -- total abx course 2 weeks from 9/7-9/21 -repeat cbc in 7 days while on linezolid -id will sign off   I spent 60 minute reviewing data/chart, and coordinating care and >50% direct face to face time providing counseling/discussing diagnostics/treatment plan with patient

## 2021-08-18 NOTE — Progress Notes (Signed)
ANTICOAGULATION CONSULT NOTE - Initial Consult  Pharmacy Consult for Heparin  Indication: DVT/VTE   No Known Allergies  Patient Measurements: Height: _0  (185.4 cm) Weight: 78 kg (171 lb 15.3 oz) IBW/kg (Calculated) : 79.9  Heparin Dosing Weight: 78 kg  Vital Signs: Temp: 98.7 F (37.1 C) (09/13 0745) Temp Source: Axillary (09/13 0745) BP: 140/68 (09/13 0924) Pulse Rate: 85 (09/13 0924)  Labs: Recent Labs    08/16/21 0541 08/18/21 0426  HGB 11.6* 10.5*  HCT 33.5* 30.6*  PLT 199 238  CREATININE 0.81 0.77    Estimated Creatinine Clearance: 107 mL/min (by C-G formula based on SCr of 0.77 mg/dL).   Medical History: Past Medical History:  Diagnosis Date   Arthritis    Asthma    Bilateral lower extremity edema 05/28/2021   propping feet up   Cough 05/28/2021   with clear sputum   Dyspnea    Dyspnea 05/28/2021   with exertion   EtOH dependence (HCC)    drinks 2-3 of 40ounes a day   Fibula fracture    Dr Mardelle Matte 02-2011   Finger osteomyelitis, right ([place]) 05/26/2021   right index finger   Hernia    surgery 01-17-13   Hypertension    not taking bp meds since may 2022   Tuberculosis    couple of yrs ago took tx at [place] per pt on 05-28-2021    Medications:  Scheduled:   carvedilol  6.25 mg Per Tube BID WC   chlorhexidine gluconate (MEDLINE KIT)  15 mL Mouth Rinse BID   Chlorhexidine Gluconate Cloth  6 each Topical Daily   dextrose       enoxaparin (LOVENOX) injection  40 mg Subcutaneous Q24H   feeding supplement (PROSource TF)  45 mL Per Tube BID   folic acid  1 mg Per Tube Daily   insulin aspart  0-9 Units Subcutaneous Q4H   levETIRAcetam  1,000 mg Per Tube BID   multivitamin with minerals  1 tablet Per Tube Daily   [START ON 08/25/2021] thiamine  100 mg Oral Daily   Infusions:   sodium chloride 5 mL/hr at 08/17/21 1600   sodium chloride 500 mL (08/17/21 2155)   feeding supplement (OSMOLITE 1.5 CAL) 1,000 mL (08/17/21 2200)    magnesium sulfate bolus IVPB 2 g (08/18/21 1021)   pencillin G potassium IV 4 Million Units (08/18/21 1020)   thiamine injection 500 mg (08/18/21 0644)   Followed by   Derrill Memo ON 08/20/2021] thiamine injection      Assessment: 60 Y.O male presented with focal recurrent seizure activity now with concerns for upper extremity VTE. Pharmacy consulted to manage heparin.  Hgb trending down from 14.3 >> 10.5. Platelets remain WNL. Per chart review, no s/sx bleeding. Will conservatively bolus at lower end of range and start heparin drip @ 1300 units/hr.    Goal of Therapy:  Heparin level 0.3-0.7 units/ml Monitor platelets by anticoagulation protocol: Yes   Plan:  - Give 4000 units bolus x 1 - Start heparin infusion at 1300 units/hr - Check heparin level in 6 hours and daily while on heparin - Continue to monitor H&H and platelets   Thank you for allowing pharmacy to be a part of this patient's care.  Ardyth Harps, PharmD Clinical Pharmacist

## 2021-08-18 NOTE — Care Management Important Message (Signed)
Important Message  Patient Details  Name: Frederick [name] MRN: [id number] Date of Birth: 08/23/1961   Medicare Important Message Given:  Yes     Narissa Beaufort Stefan Church 08/18/2021, 3:03 PM

## 2021-08-18 NOTE — Progress Notes (Signed)
Pt asked to talk to his wife when I told him she called last night. I told him she'll back and now that he's more awake he can talk to her.

## 2021-08-18 NOTE — Progress Notes (Signed)
Bilateral upper extremity venous duplex completed. Refer to "CV Proc" under chart review to view preliminary results.  Preliminary results discussed with Caryn Bee, RN, and Dr. Lowell Guitar via secure chat.  08/18/2021 10:35 AM Eula Fried., MHA, RVT, RDCS, RDMS

## 2021-08-18 NOTE — Progress Notes (Signed)
ANTICOAGULATION CONSULT NOTE  Pharmacy Consult for Heparin  Indication: DVT/VTE   No Known Allergies  Patient Measurements: Height: '6\' 1"'  (185.4 cm) Weight: 78 kg (171 lb 15.3 oz) IBW/kg (Calculated) : 79.9  Heparin Dosing Weight: 78 kg  Vital Signs: Temp: 98.8 F (37.1 C) (09/13 1539) Temp Source: Axillary (09/13 1539) BP: 110/69 (09/13 1752) Pulse Rate: 85 (09/13 1752)  Labs: Recent Labs    08/16/21 0541 08/18/21 0426 08/18/21 1758  HGB 11.6* 10.5*  --   HCT 33.5* 30.6*  --   PLT 199 238  --   HEPARINUNFRC  --   --  0.19*  CREATININE 0.81 0.77  --      Estimated Creatinine Clearance: 107 mL/min (by C-G formula based on SCr of 0.77 mg/dL).   Medical History: Past Medical History:  Diagnosis Date   Arthritis    Asthma    Bilateral lower extremity edema 05/28/2021   propping feet up   Cough 05/28/2021   with clear sputum   Dyspnea    Dyspnea 05/28/2021   with exertion   EtOH dependence (HCC)    drinks 2-3 of 40ounes a day   Fibula fracture    Dr Mardelle Matte 02-2011   Finger osteomyelitis, right ([place]) 05/26/2021   right index finger   Hernia    surgery 01-17-13   Hypertension    not taking bp meds since may 2022   Tuberculosis    couple of yrs ago took tx at [place] per pt on 05-28-2021    Medications:  Scheduled:   carvedilol  6.25 mg Per Tube BID WC   chlorhexidine gluconate (MEDLINE KIT)  15 mL Mouth Rinse BID   Chlorhexidine Gluconate Cloth  6 each Topical Daily   dextrose       feeding supplement (PROSource TF)  45 mL Per Tube BID   folic acid  1 mg Per Tube Daily   insulin aspart  0-9 Units Subcutaneous Q4H   levETIRAcetam  1,000 mg Per Tube BID   linezolid  600 mg Oral Q12H   multivitamin with minerals  1 tablet Per Tube Daily   [START ON 08/25/2021] thiamine  100 mg Oral Daily   Infusions:   sodium chloride 5 mL/hr at 08/17/21 1600   sodium chloride 500 mL (08/17/21 2155)   feeding supplement (OSMOLITE 1.5 CAL)  1,000 mL (08/18/21 1531)   heparin 1,300 Units/hr (08/18/21 1230)   thiamine injection 500 mg (08/18/21 1433)   Followed by   Derrill Memo ON 08/20/2021] thiamine injection      Assessment: 60 Y.O male presented with focal recurrent seizure activity now with concerns for upper extremity VTE. Pharmacy consulted to manage heparin.  Initial heparin level subtherapeutic at 0.19, drawn slightly early.  Goal of Therapy:  Heparin level 0.3-0.7 units/ml Monitor platelets by anticoagulation protocol: Yes   Plan:  -Heparin bolus 2000 units x1  -Increase heparin infusion to 1550 units/h -Recheck heparin level in 8h  Frederick [name], PharmD, [place], [facility] Clinical Pharmacist [phone] Please check AMION for all [facility] Pharmacy numbers 08/18/2021

## 2021-08-18 NOTE — Evaluation (Signed)
Clinical/Bedside Swallow Evaluation Patient Details  Name: Frederick [name] MRN: [id number] Date of Birth: 04-Mar-1961  Today's Date: 08/18/2021 Time: SLP Start Time (ACUTE ONLY): 0930 SLP Stop Time (ACUTE ONLY): 0950 SLP Time Calculation (min) (ACUTE ONLY): 20 min  Past Medical History:  Past Medical History:  Diagnosis Date   Arthritis    Asthma    Bilateral lower extremity edema 05/28/2021   propping feet up   Cough 05/28/2021   with clear sputum   Dyspnea    Dyspnea 05/28/2021   with exertion   EtOH dependence (HCC)    drinks 2-3 of 40ounes a day   Fibula fracture    Dr Dion Saucier 02-2011   Finger osteomyelitis, right (HCC) 05/26/2021   right index finger   Hernia    surgery 01-17-13   Hypertension    not taking bp meds since may 2022   Tuberculosis    couple of yrs ago took tx at [facility] per pt on 05-28-2021   Past Surgical History:  Past Surgical History:  Procedure Laterality Date   AMPUTATION Right 05/29/2021   Procedure: PARTIAL RIGHT 2ND FINGER AMPUTATION;  Surgeon: Kathryne Hitch, MD;  Location: [facility];  Service: Orthopedics;  Laterality: Right;   ANKLE SURGERY     INGUINAL HERNIA REPAIR Right 01/19/2013   Procedure: HERNIA REPAIR INGUINAL ADULT;  Surgeon: [name], MD,FACS;  Location: [facility] OR;  Service: General;  Laterality: Right;   TEE WITHOUT CARDIOVERSION N/A 08/14/2021   Procedure: TRANSESOPHAGEAL ECHOCARDIOGRAM (TEE);  Surgeon: Wendall Stade, MD;  Location: [facility] ENDOSCOPY;  Service: Cardiovascular;  Laterality: N/A;   TIBIA IM NAIL INSERTION Left 01/22/2013   Procedure: INTRAMEDULLARY (IM) NAIL TIBIAL;  Surgeon: [name], MD;  Location: [facility] OR;  Service: Orthopedics;  Laterality: Left;   HPI:  Frederick [name] is a 60 y.o. male with tobacco abuse, ETOH abuse (significantly > 80 oz beer per day, liquor per wife). Admitted with 1 week history of recurrent focal seizure activity. CT atrophy with small vessel  chronic ischemic changes of deep cerebral white matter. 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 Required intubated 9/5-9/7. Other PMH: TBI x 2 (2001, 2016), asthma, cough.   Assessment / Plan / Recommendation Clinical Impression  Pt seen at bedside for assessment of swallow function and safety. Pt was resting in bed, but awakened easily with verbal stim. Rattly breath sounds noted, as well as slightly wet voice quality. Oral care was completed, with successful removal of dried secretions on the lips, and secretions coughed up during oral care. Pt was given individual ice chips x2, with immediate cough response elicited. Pt then accepted small boluses of applesauce, which resulted in immediate congested nonproductive cough. PO trials were terminated at this point, due to suspected high risk of aspiration. Recommend continuing with regular thorough oral care and strict NPO status. SLP will continue to follow to assess readiness for PO intake and/or instrumental study. RN and MD informed.  SLP Visit Diagnosis: Dysphagia, unspecified (R13.10)    Aspiration Risk  Severe aspiration risk    Diet Recommendation NPO   Medication Administration: Via alternative means    Other  Recommendations Oral Care Recommendations: Oral care QID Other Recommendations: Have oral suction available   Follow up Recommendations Other (comment) (TBD)      Frequency and Duration min 1 x/week  1 week;2 weeks       Prognosis Prognosis for Safe Diet Advancement: Fair Barriers to Reach  Goals: Cognitive deficits      Swallow Study   General HPI: Frederick [name] is a 60 y.o. male with tobacco abuse, ETOH abuse (significantly > 80 oz beer per day, liquor per wife). Admitted with 1 week history of recurrent focal seizure activity. CT atrophy with small vessel chronic ischemic changes of deep cerebral white matter. 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 Required intubated 9/5-9/7. Other PMH: TBI x  2 (2001, 2016), asthma, cough. Type of Study: Bedside Swallow Evaluation Previous Swallow Assessment: none Diet Prior to this Study: NPO;NG Tube Temperature Spikes Noted: No Respiratory Status: Nasal cannula Behavior/Cognition: Alert;Requires cueing Oral Care Completed by SLP: Yes Oral Cavity - Dentition: Missing dentition Vision: Functional for self-feeding Self-Feeding Abilities: Total assist Patient Positioning: Upright in bed Baseline Vocal Quality: Normal Volitional Cough: Strong Volitional Swallow: Able to elicit    Oral/Motor/Sensory Function Overall Oral Motor/Sensory Function: Within functional limits   Ice Chips Ice chips: Impaired Presentation: Spoon Oral Phase Functional Implications: Prolonged oral transit Pharyngeal Phase Impairments: Cough - Immediate;Wet Vocal Quality   Thin Liquid Thin Liquid: Not tested    Nectar Thick Nectar Thick Liquid: Not tested   Honey Thick Honey Thick Liquid: Not tested   Puree Puree: Impaired Presentation: Spoon Oral Phase Functional Implications: Prolonged oral transit Pharyngeal Phase Impairments: Cough - Immediate   Solid     Solid: Not tested     Daphne Karrer B. Murvin Natal, [facility], CCC-SLP Speech Language Pathologist Office: [phone]  Leigh Aurora 08/18/2021,10:15 AM

## 2021-08-18 NOTE — Progress Notes (Signed)
Pt mumbled a few things to significant other on the phone but kept falling asleep

## 2021-08-18 NOTE — Progress Notes (Signed)
PT Cancellation Note  Patient Details Name: ASER NYLUND MRN: [id number] DOB: 1961-04-05   Cancelled Treatment:    Reason Eval/Treat Not Completed: (P) Patient not medically ready (Care team just initiated heparin drip, will need 24 hours for anticoagulation prior to PT session.) Will continue efforts next date per PT POC as schedule permits.   Dorathy Kinsman Camellia Popescu 08/18/2021, 11:46 AM

## 2021-08-18 NOTE — Progress Notes (Signed)
Arrived on the unit at this time per bed. Pt in stable condition and connected to Tele

## 2021-08-18 NOTE — Progress Notes (Signed)
OT Cancellation Note  Patient Details Name: Frederick [name] MRN: [id number] DOB: 1961-02-08   Cancelled Treatment:    Reason Eval/Treat Not Completed: Patient not medically ready Pt with initial consult for heparin this date and to be started on heparin at 12:30 this date. OT will hold at this time as pt is not appropriate for therapy at this time. Will return after 24hour window and as pt is appropriate.   [facility] OTR/L Acute Rehabilitation Services Office: [phone]   Rebeca Alert 08/18/2021, 11:43 AM

## 2021-08-18 NOTE — Consult Note (Signed)
[facility]    Date of Admission:  08/10/2021   Total days of antibiotics 7        Day 1 Penicillin G        Unasyn 08/11/21-08/13/21        Rocephin 08/13/21-08/17/21       Reason for Consult: Strep. Anginosus bacteremia    Provider: Prudencio Pair, MD Primary Care Provider: [facility], [facility]  Assessment: 60 year old male with history of AUD; TB (2001 and 2016); COPD/asthma; daily smoker presented to the ED on 08/10/2021 after witnessed tonic-clonic seizure in the setting of decreased alcohol consumption.  Patient admitted for acute encephalopathy.  Patient was found to have strep anginosus bacteremia on blood cultures obtained 08/10/2021.  Chest x-ray revealed chronic lung scarring in the right upper lobe with mediastinal shift to the right.  CT of the chest shows persistent cavitary changes in the right upper lobe; with increased bilateral nodular airspace (seen on previous imaging).  Repeat blood cultures 08/12/2021 revealed no growth. Respiratory gram stain and culture revealed no growth Echocardiogram-no vegetation TEE-no vegetation  Encephalopathy most likely caused by alcohol abuse with gtc seizure.  Patient is clinically improving and current antibiotic management is appropriate.   Plan: Streptococcus Anginosus bacteremia source unknown Initial concern encephalopathy caused by bacteremia however patient has been showing clinical improvement since beginning antibiotic therapy.  Discontinue penicillin G; start linezolid for total duration of 2 weeks 08/12/2021 - 08/26/2021 Repeat CBC in 7 days while on linezolid due to risk of thrombocytopenia/neutropenia Patient can follow-up with PCP for repeat chest CT in 6 to 8 weeks for ongoing monitoring of B symptoms ID will sign off  Active Problems:   Seizures ([place])   Status epilepticus ([place])   Malnutrition of moderate degree   Bacteremia due to group B Streptococcus   Acute systolic CHF (congestive heart failure)  (HCC)   Scheduled Meds:  carvedilol  6.25 mg Per Tube BID WC   chlorhexidine gluconate (MEDLINE KIT)  15 mL Mouth Rinse BID   Chlorhexidine Gluconate Cloth  6 each Topical Daily   dextrose       feeding supplement (PROSource TF)  45 mL Per Tube BID   folic acid  1 mg Per Tube Daily   insulin aspart  0-9 Units Subcutaneous Q4H   levETIRAcetam  1,000 mg Per Tube BID   linezolid  600 mg Oral Q12H   multivitamin with minerals  1 tablet Per Tube Daily   [START ON 08/25/2021] thiamine  100 mg Oral Daily   Continuous Infusions:  sodium chloride 5 mL/hr at 08/17/21 1600   sodium chloride 500 mL (08/17/21 2155)   feeding supplement (OSMOLITE 1.5 CAL) 1,000 mL (08/17/21 2200)   heparin 1,300 Units/hr (08/18/21 1230)   thiamine injection 500 mg (08/18/21 0644)   Followed by   Derrill Memo ON 08/20/2021] thiamine injection     PRN Meds:.sodium chloride, sodium chloride, docusate, ipratropium-albuterol, labetalol, LORazepam **OR** LORazepam, ondansetron (ZOFRAN) IV, polyethylene glycol  HPI: Frederick [name] is a 61 y.o. 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  Patient was intubated on arrival due to respiratory failure and airway protection, patient was started on midazolam infusion.  Patient was not hypoglycemic.  Initial imaging revealed CT scan of the head  showed no stroke or active bleeding; EEG showed diffuse encephalopathy; chest x-ray revealed chronic lung scarring of the right upper lobe with mediastinal shift to the right; chest CT revealed persistent cavitary changes in right upper lobe; increase presence of bilateral nodule airspace (which has been seen on previous imaging); peritracheal lymph nodes noted.  Blood cultures drawn on 08/10/2021 revealed strep Anginosus; patient was started on  Unasyn (08/11/2021 - 08/13/2021) and Rocephin (08/13/2021 - 08/17/2021); repeat blood cultures on 08/12/2021 revealed no growth.  Respiratory gram staining and culture revealed no growth  Echocardiogram revealed no vegetation; TEE revealed no vegetation  Procalcitonin 5.2; lactic acid levels trending down; HIV negative; RPR-nonreactive  Patient started on penicillin G 08/17/2021  Today, I seen and evaluated Frederick [name] at bedside.  He was A&Ox3.  He denies any complaints at this time.  He states that he wants to go home.  He denies headache, chest pain, shortness of breath, cough, N/B, constipation, diarrhea, lower extremity discomfort.  He denies subjective fevers or chills.   Review of Systems: Review of Systems  Constitutional:  Negative for chills and fever.  Respiratory:  Negative for cough and shortness of breath.   Cardiovascular:  Negative for chest pain and leg swelling.  Gastrointestinal:  Negative for constipation, diarrhea, nausea and vomiting.  Musculoskeletal:  Negative for myalgias.  Skin:  Negative for rash.   Past Medical History:  Diagnosis Date   Arthritis    Asthma    Bilateral lower extremity edema 05/28/2021   propping feet up   Cough 05/28/2021   with clear sputum   Dyspnea    Dyspnea 05/28/2021   with exertion   EtOH dependence (HCC)    drinks 2-3 of 40ounes a day   Fibula fracture    Dr Mardelle Matte 02-2011   Finger osteomyelitis, right ([place]) 05/26/2021   right index finger   Hernia    surgery 01-17-13   Hypertension    not taking bp meds since may 2022   Tuberculosis    couple of yrs ago took tx at [facility] per pt on 05-28-2021    Social History   Tobacco Use   Smoking status: Every Day    Packs/day: 0.50    Types: Cigarettes   Smokeless tobacco: Never   Tobacco comments:    1 ppd  Vaping Use   Vaping Use: Never used  Substance Use Topics   Alcohol use: Yes    Comment: 1 beer this am per pt on 05-28-2021   Drug use: No     Family History  Problem Relation Age of Onset   Diabetes Mother    Diabetes Father    Hypertension Father    No Known Allergies  OBJECTIVE: Blood pressure 108/67, pulse 96, temperature 99.5 F (37.5 C), temperature source Oral, resp. rate 18, height '6\' 1"'  (1.854 m), weight 78 kg, SpO2 99 %.  Physical Exam Constitutional:      General: He is awake.     Comments: Feeding tube present  HENT:     Head: Normocephalic and atraumatic.  Cardiovascular:     Rate and Rhythm: Normal rate.     Heart sounds: Normal heart sounds.  Pulmonary:     Breath sounds: No decreased breath sounds.  Musculoskeletal:     Right lower leg: No edema.     Left lower leg: No edema.  Skin:    Comments: No rash or lesions noted on the skin  Neurological:     Mental  Status: He is alert and oriented to person, place, and time.  Psychiatric:        Behavior: Behavior is cooperative.     Comments: Patient behavior is appropriate and expresses interest in going home.    Lab Results Lab Results  Component Value Date   WBC 6.1 08/18/2021   HGB 10.5 (L) 08/18/2021   HCT 30.6 (L) 08/18/2021   MCV 91.9 08/18/2021   PLT 238 08/18/2021    Lab Results  Component Value Date   CREATININE 0.77 08/18/2021   BUN 13 08/18/2021   NA 127 (L) 08/18/2021   K 4.1 08/18/2021   CL 94 (L) 08/18/2021   CO2 26 08/18/2021    Lab Results  Component Value Date   ALT 35 08/18/2021   AST 56 (H) 08/18/2021   ALKPHOS 63 08/18/2021   BILITOT 0.4 08/18/2021     Microbiology: Recent Results (from the past 240 hour(s))  Resp Panel by RT-PCR (Flu A&B, Covid) Nasopharyngeal Swab     Status: None   Collection Time: 08/10/21 12:15 PM   Specimen: Nasopharyngeal Swab; Nasopharyngeal(NP) swabs in vial transport medium  Result Value Ref Range Status   SARS Coronavirus 2 by RT PCR NEGATIVE NEGATIVE Final    Comment: (NOTE) SARS-CoV-2 target nucleic acids are NOT DETECTED.  The SARS-CoV-2 RNA is generally detectable in  upper respiratory specimens during the acute phase of infection. The lowest concentration of SARS-CoV-2 viral copies this assay can detect is 138 copies/mL. A negative result does not preclude SARS-Cov-2 infection and should not be used as the sole basis for treatment or other patient management decisions. A negative result may occur with  improper specimen collection/handling, submission of specimen other than nasopharyngeal swab, presence of viral mutation(s) within the areas targeted by this assay, and inadequate number of viral copies(<138 copies/mL). A negative result must be combined with clinical observations, patient history, and epidemiological information. The expected result is Negative.  Fact Sheet for Patients:  EntrepreneurPulse.com.au  Fact Sheet for Healthcare Providers:  IncredibleEmployment.be  This test is no t yet approved or cleared by the [place] FDA and  has been authorized for detection and/or diagnosis of SARS-CoV-2 by FDA under an Emergency Use Authorization (EUA). This EUA will remain  in effect (meaning this test can be used) for the duration of the COVID-19 declaration under Section 564(b)(1) of the Act, 21 U.S.C.section 360bbb-3(b)(1), unless the authorization is terminated  or revoked sooner.       Influenza A by PCR NEGATIVE NEGATIVE Final   Influenza B by PCR NEGATIVE NEGATIVE Final    Comment: (NOTE) The Xpert Xpress SARS-CoV-2/FLU/RSV plus assay is intended as an aid in the diagnosis of influenza from Nasopharyngeal swab specimens and should not be used as a sole basis for treatment. Nasal washings and aspirates are unacceptable for Xpert Xpress SARS-CoV-2/FLU/RSV testing.  Fact Sheet for Patients: EntrepreneurPulse.com.au  Fact Sheet for Healthcare Providers: IncredibleEmployment.be  This test is not yet approved or cleared by the [place] FDA and has been  authorized for detection and/or diagnosis of SARS-CoV-2 by FDA under an Emergency Use Authorization (EUA). This EUA will remain in effect (meaning this test can be used) for the duration of the COVID-19 declaration under Section 564(b)(1) of the Act, 21 U.S.C. section 360bbb-3(b)(1), unless the authorization is terminated or revoked.  Performed at [facility], [place] [address] [place], [place] [address]   Culture, blood (routine x 2)     Status: Abnormal  Collection Time: 08/10/21  2:00 PM   Specimen: BLOOD LEFT HAND  Result Value Ref Range Status   Specimen Description BLOOD LEFT HAND  Final   Special Requests   Final    BOTTLES DRAWN AEROBIC ONLY Blood Culture results may not be optimal due to an inadequate volume of blood received in culture bottles   Culture  Setup Time   Final    GRAM POSITIVE COCCI IN CHAINS AEROBIC BOTTLE ONLY CRITICAL RESULT CALLED TO, READ BACK BY AND VERIFIED WITH: V BRYK,PHARMD'@0110'  08/12/21 [place] Performed at [facility], [place] [address], [place], [place] [address]    Culture STREPTOCOCCUS ANGINOSIS (A)  Final   Report Status 08/14/2021 FINAL  Final   Organism ID, Bacteria STREPTOCOCCUS ANGINOSIS  Final      Susceptibility   Streptococcus anginosis - MIC*    PENICILLIN 0.12 SENSITIVE Sensitive     CEFTRIAXONE 0.5 SENSITIVE Sensitive     ERYTHROMYCIN >=8 RESISTANT Resistant     LEVOFLOXACIN 1 SENSITIVE Sensitive     VANCOMYCIN 0.5 SENSITIVE Sensitive     * STREPTOCOCCUS ANGINOSIS  Blood Culture ID Panel (Reflexed)     Status: Abnormal   Collection Time: 08/10/21  2:00 PM  Result Value Ref Range Status   Enterococcus faecalis NOT DETECTED NOT DETECTED Final   Enterococcus Faecium NOT DETECTED NOT DETECTED Final   Listeria monocytogenes NOT DETECTED NOT DETECTED Final   Staphylococcus species NOT DETECTED NOT DETECTED Final   Staphylococcus aureus (BCID) NOT DETECTED NOT DETECTED Final   Staphylococcus epidermidis NOT DETECTED NOT DETECTED  Final   Staphylococcus lugdunensis NOT DETECTED NOT DETECTED Final   Streptococcus species DETECTED (A) NOT DETECTED Final    Comment: Not Enterococcus species, Streptococcus agalactiae, Streptococcus pyogenes, or Streptococcus pneumoniae. CRITICAL RESULT CALLED TO, READ BACK BY AND VERIFIED WITH: V BRYK,PHARMD'@0110'  08/12/21 [place]    Streptococcus agalactiae NOT DETECTED NOT DETECTED Final   Streptococcus pneumoniae NOT DETECTED NOT DETECTED Final   Streptococcus pyogenes NOT DETECTED NOT DETECTED Final   A.calcoaceticus-baumannii NOT DETECTED NOT DETECTED Final   Bacteroides fragilis NOT DETECTED NOT DETECTED Final   Enterobacterales NOT DETECTED NOT DETECTED Final   Enterobacter cloacae complex NOT DETECTED NOT DETECTED Final   Escherichia coli NOT DETECTED NOT DETECTED Final   Klebsiella aerogenes NOT DETECTED NOT DETECTED Final   Klebsiella oxytoca NOT DETECTED NOT DETECTED Final   Klebsiella pneumoniae NOT DETECTED NOT DETECTED Final   Proteus species NOT DETECTED NOT DETECTED Final   Salmonella species NOT DETECTED NOT DETECTED Final   Serratia marcescens NOT DETECTED NOT DETECTED Final   Haemophilus influenzae NOT DETECTED NOT DETECTED Final   Neisseria meningitidis NOT DETECTED NOT DETECTED Final   Pseudomonas aeruginosa NOT DETECTED NOT DETECTED Final   Stenotrophomonas maltophilia NOT DETECTED NOT DETECTED Final   Candida albicans NOT DETECTED NOT DETECTED Final   Candida auris NOT DETECTED NOT DETECTED Final   Candida glabrata NOT DETECTED NOT DETECTED Final   Candida krusei NOT DETECTED NOT DETECTED Final   Candida parapsilosis NOT DETECTED NOT DETECTED Final   Candida tropicalis NOT DETECTED NOT DETECTED Final   Cryptococcus neoformans/gattii NOT DETECTED NOT DETECTED Final    Comment: Performed at [facility], [address], [place], [place] [address]  MRSA Next Gen by PCR, Nasal     Status: None   Collection Time: 08/10/21  4:13 PM   Specimen: Urine, Clean  Catch; Nasal Swab  Result Value Ref Range Status   MRSA by  PCR Next Gen NOT DETECTED NOT DETECTED Final    Comment: (NOTE) The GeneXpert MRSA Assay (FDA approved for NASAL specimens only), is one component of a comprehensive MRSA colonization surveillance program. It is not intended to diagnose MRSA infection nor to guide or monitor treatment for MRSA infections. Test performance is not FDA approved in patients less than 68 years old. Performed at [facility], [place] [address] [place], [place] [address]   Culture, blood (routine x 2)     Status: Abnormal   Collection Time: 08/10/21  4:21 PM   Specimen: BLOOD  Result Value Ref Range Status   Specimen Description BLOOD SITE NOT SPECIFIED  Final   Special Requests   Final    BOTTLES DRAWN AEROBIC AND ANAEROBIC Blood Culture results may not be optimal due to an inadequate volume of blood received in culture bottles   Culture  Setup Time   Final    GRAM POSITIVE COCCI IN CHAINS AEROBIC BOTTLE ONLY CRITICAL VALUE NOTED.  VALUE IS CONSISTENT WITH PREVIOUSLY REPORTED AND CALLED VALUE.    Culture (A)  Final    STREPTOCOCCUS ANGINOSIS SUSCEPTIBILITIES PERFORMED ON PREVIOUS CULTURE WITHIN THE LAST 5 DAYS. Performed at [facility], [place] [address], [place], [place] [address]    Report Status 08/14/2021 FINAL  Final  Culture, Respiratory w Gram Stain     Status: None   Collection Time: 08/11/21  8:09 AM   Specimen: Tracheal Aspirate; Respiratory  Result Value Ref Range Status   Specimen Description TRACHEAL ASPIRATE  Final   Special Requests NONE  Final   Gram Stain   Final    FEW WBC PRESENT,BOTH PMN AND MONONUCLEAR NO ORGANISMS SEEN    Culture   Final    Normal respiratory flora-no Staph aureus or Pseudomonas seen Performed at [facility], [address] [place], [place] [address]    Report Status 08/13/2021 FINAL  Final  Culture, blood (routine x 2)     Status: None   Collection Time: 08/12/21  7:57 AM    Specimen: BLOOD  Result Value Ref Range Status   Specimen Description BLOOD RIGHT ANTECUBITAL  Final   Special Requests   Final    BOTTLES DRAWN AEROBIC AND ANAEROBIC Blood Culture results may not be optimal due to an excessive volume of blood received in culture bottles   Culture   Final    NO GROWTH 5 DAYS Performed at [facility], [place] [address], [place], [place] [address]    Report Status 08/17/2021 FINAL  Final  Culture, blood (routine x 2)     Status: None   Collection Time: 08/12/21  8:14 AM   Specimen: BLOOD LEFT HAND  Result Value Ref Range Status   Specimen Description BLOOD LEFT HAND  Final   Special Requests   Final    BOTTLES DRAWN AEROBIC AND ANAEROBIC Blood Culture adequate volume   Culture   Final    NO GROWTH 5 DAYS Performed at [facility], [place] [address], [place],  [address]    Report Status 08/17/2021 FINAL  Final   CT CHEST, ABDOMEN, AND PELVIS WITH CONTRAST   TECHNIQUE: Multidetector CT imaging of the chest, abdomen and pelvis was performed following the standard protocol during bolus administration of intravenous contrast.   CONTRAST:  121m OMNIPAQUE IOHEXOL 350 MG/ML SOLN   COMPARISON:  08/14/2021, 02/12/2020   FINDINGS: CT CHEST FINDINGS   Cardiovascular: The heart is unremarkable without pericardial  effusion. No evidence of thoracic aortic aneurysm or dissection. Minimal atherosclerosis of the aorta and coronary vasculature. 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   Mediastinum/Nodes: Enteric catheter extends into the gastric lumen. Thyroid and trachea are unremarkable. Subcentimeter right paratracheal lymph nodes may be reactive. No pathologic adenopathy.   Lungs/Pleura: Large area of cavitation in volume loss within the right apex again noted. Innumerable bilateral nodular opacities are again seen, increased in size and number since previous study.  The waxing and waning appearance since 2016 suggests chronic atypical infection such as mycobacterium or fungal organism. Trace bilateral pleural effusions are noted. No pneumothorax. Central airways are patent.   Musculoskeletal: There are no acute displaced rib fractures. Chronic bilateral healed rib fractures again noted. Reconstructed images demonstrate no additional findings.   CT ABDOMEN PELVIS FINDINGS   Hepatobiliary: There are small calcified gallstones without evidence of acute cholecystitis. Hepatic steatosis again noted, otherwise the liver is unremarkable.   Pancreas: Unremarkable. No pancreatic ductal dilatation or surrounding inflammatory changes.   Spleen: Normal in size without focal abnormality.   Adrenals/Urinary Tract: The kidneys enhance normally and symmetrically. No urinary tract calculi or obstructive uropathy. The adrenals are unremarkable. The bladder is decompressed, limiting its evaluation.   Stomach/Bowel: No bowel obstruction or ileus. Normal appendix right lower quadrant. No bowel wall thickening or inflammatory change.   Vascular/Lymphatic: Aortic atherosclerosis. No enlarged abdominal or pelvic lymph nodes.   Reproductive: Prostate is unremarkable.   Other: No free fluid or free intraperitoneal gas. No abdominal wall hernia.   Musculoskeletal: No acute bony abnormalities. Chronic compression deformity within the superior endplate of L5. Reconstructed images demonstrate no additional findings.   IMPRESSION: 1. Persistent cavitary changes within the right upper lobe, with increased prominence of the bilateral nodular airspace disease seen previously. Given the persistence over time, findings are compatible with chronic infection such as mycobacterium or fungus. 2. Trace bilateral pleural effusions. 3. Subcentimeter reactive right paratracheal lymph nodes. No pathologic adenopathy within the chest, abdomen, or pelvis. 4. Cholelithiasis  without cholecystitis. 5. Hepatic steatosis. 6.  Aortic Atherosclerosis (ICD10-I70.0).   CT MAXILLOFACIAL WITH CONTRAST   TECHNIQUE: Multidetector CT imaging of the maxillofacial structures was performed with intravenous contrast. Multiplanar CT image reconstructions were also generated.   CONTRAST:  1103m OMNIPAQUE IOHEXOL 350 MG/ML SOLN   COMPARISON:  Brain MRI 08/14/2021, CT head 08/10/2021   FINDINGS: Osseous: The facial bones are unremarkable, with no evidence of fracture or osseous destruction. There are bulky anterior osteophytes throughout the imaged cervical spine.   Orbits: The globes and orbits are unremarkable.   Sinuses: The paranasal sinuses are clear. The mastoid air cells are clear.   Soft tissues: Unremarkable. There is no evidence of abscess. There is no lymphadenopathy in the neck to the level imaged.   Limited intracranial: The imaged portions of the intracranial compartment are unremarkable.   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 There is calcified atherosclerotic plaque of the bilateral carotid bulbs and cavernous ICAs without hemodynamically significant stenosis or occlusion.   Other: An enteric catheter is in place.   IMPRESSION: No evidence of abscess or other acute finding.  PORTABLE ABDOMEN - 1 VIEW   COMPARISON:  August 10, 2021.   FINDINGS: The bowel gas pattern is normal. Distal tip of feeding tube is seen in expected position of distal stomach. No radio-opaque calculi or other significant radiographic abnormality are seen.  IMPRESSION: Distal tip of feeding tube is seen in expected position of distal stomach.  CT HEAD WITHOUT CONTRAST   TECHNIQUE: Contiguous axial images were obtained from the base of the skull through the vertex without intravenous contrast. Sagittal and coronal MPR images reconstructed from axial data set.   COMPARISON:  06/17/2009   FINDINGS: Brain: Generalized  atrophy. Normal ventricular morphology. No midline shift or mass effect. Small vessel chronic ischemic changes of deep cerebral white matter. 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 No intracranial hemorrhage, mass lesion, evidence of acute infarction, or extra-axial fluid collection.   Vascular: No hyperdense vessels. Atherosclerotic calcification of internal carotid arteries at skull base.   Skull: Intact   Sinuses/Orbits: Clear.  Nasal septal deviation to the RIGHT.   Other: N/A   IMPRESSION: Atrophy with small vessel chronic ischemic changes of deep cerebral white matter.   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   No acute intracranial abnormalities.    Timothy Lasso, MD PGY-1 Internal Medicine Resident [facility] for Infectious Disease [place] [phone] pager    08/18/2021, 1:43 PM

## 2021-08-18 NOTE — Progress Notes (Addendum)
PROGRESS NOTE    Frederick [name]  HAL:[id number] DOB: [birth year] DOA: 08/10/2021 PCP: [name]   Chief Complaint  Patient presents with   Seizures   Brief Narrative:  60 yo M with hx of tobacco abuse, etoh abuse, hx TB s/p treatment (in 2001 and 2016)  and multiple other medical problems who presented with 1 week of focal recurrent seizure activity.  EMS was called by wife 2/2 concern for stroke.  On EMS arrival he was actively seizing with generalized tonic clonic seizure.  He was brought to the ED and required intubation for airway protection.   He was admitted to the ICU.  Neurology was consulted for status epilepticus in the setting of etoh withdrawal.  He's being continued on keppra until outpatient follow up.  MRI was limited by motion, but without notable abnormalities.  Hospitalization was complicated by concern for aspiration pneumonia as well as strep anginosis bacteremia.  Frederick [name] TEE was negative for vegetations.  An echo at admission showed reduced EF of 25-30%, which was improved to 50-55% on the TEE.  Cardiology has followed, now signed off.   He's currently receiving tube feeds by cortrak.  He continues to be encephalopathic.  ID has been consulted for his bacteremia.    TRH following as of 9/12.   Assessment & Plan:   Active Problems:   Seizures ([place])   Status epilepticus (HCC)   Malnutrition of moderate degree   Bacteremia due to group B Streptococcus   Acute systolic CHF (congestive heart failure) (HCC)  Status Epilepticus EEG on 9/5 withotu seizures or epileptiform discharges Overnight EEG suggestive of severe diffuse encephalopathy Continue keppra 1000 mg BID  Neurology was c/s, now signed off - recommending continue keppra 100 mg BID, can likely be weaned in Frederick [name] few months given provoked in setting of etoh withdrawal PRN versed for seizure like activity Follow with neurology in 6-8 weeks  Strep Anginosis Bacteremia  Concern for aspiration  pneumonia Transition to penicillin -> planning for linezolid x2 weeks, needs repeat CBC in 7 days Blood cx from 9/5 with strep anginosus Repeat blood cx from 9/7 NGTD CT chest/abd/pelvis with persistent cavitary changes in RUL, trace bilateral effusion, subcentimeter reactiv R paratracheal LN's.  No evidence of abscess or acute findings on CT max/face. ID consulted - recommending linezolid, repeat labs, repeat imaging in 6-8 weeks Temp has been elevated (100 9/12) - last true temp appears to have been 9/11 at 8 am  Persistent Cavitary changes within the right upper lobe Prominence of bilateral nodular airspace disease - concerning for chronic infection such as mycobacterium or fungus Hx pulm tb s/p tx 2016 ID recommending monitoring yearly b symptoms/cough - consider repeat CT chest in 6-8 weeks  Etoh Withdrawal  He's still altered CIWA with prn ativan  Continue to monitor   Acute Metabolic Encephalopathy High dose thiamine - ? 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 Chronicity, will discuss with neurology - neurology notes this would not cause LUE weakness - consider repeat imaging additional w/u, will monitor for now  HFrEF  Sinus Tachycardia EF on Echo 25-30% TEE with EF 50-55% Continue coreg Per cards, no need for ace/arb based on normal EF  Age Indeterminate DVT of L subclavian  Superficial vein thrombosis  of L cephalic vein  Bilateral Upper Extremity Edema Heparin gtt  Hypertension Coreg Continue labetalol prn  Dysphagia  Severe Aspiration Risk  Moderate protein calorie malnutrition Cortrak in place with tube feeds Follow for improvement  A1c 4.8 On tube feeds, continue SSI for  now Recurrent hypoglycemia, from report from RN yesterday, finger sticks have been unreliable (per discussion with RN today, left hand is unreliable - right hand, ear lobes and toes ok)  DVT prophylaxis: lovenox Code Status: full  Family Communication: none at bedside - wife did not answer phone Disposition:   Status is: Inpatient  Remains inpatient appropriate because:Inpatient level of care appropriate due to severity of illness  Dispo: The patient is from: Home              Anticipated d/c is to:  pending              Patient currently is not medically stable to d/c.   Difficult to place patient No       Consultants:  PCCM Cardiology  ID  Procedures:  Echo IMPRESSIONS     1. Left ventricular ejection fraction, by estimation, is 25 to 30%. The  left ventricle has severely decreased function. The left ventricle  demonstrates regional wall motion abnormalities. Unusual pattern with the  mid LV wall segments appearing akinetic  while the apex and the basal segments contract. Possibly Frederick [name] mid-wall  Takotsubo variant. Left ventricular diastolic function could not be  evaluated.   2. Right ventricular systolic function is mildly reduced. The right  ventricular size is normal.   3. The mitral valve is normal in structure. No evidence of mitral valve  regurgitation. No evidence of mitral stenosis.   4. The aortic valve was not well visualized. Aortic valve regurgitation  is not visualized. No aortic stenosis is present.   5. Technically difficult study with very limited images.   TEE IMPRESSIONS     1. Left ventricular ejection fraction, by estimation, is 50 to 55%. The  left ventricle has low normal function.   2. Right ventricular systolic function is normal. The right ventricular  size is normal.   3. Left atrial size was mildly dilated. No left atrial/left atrial  appendage thrombus was detected.   4. The mitral valve is normal in structure. No evidence of mitral  valve  regurgitation.   5. The aortic valve is tricuspid. Aortic valve regurgitation is not  visualized. No aortic stenosis is present.   6. Evidence of atrial level shunting detected by color flow Doppler.  There is Frederick [name] small patent foramen ovale with predominantly left to right  shunting across the atrial septum.   Antimicrobials:  Anti-infectives (From admission, onward)    Start     Dose/Rate Route Frequency Ordered Stop   08/18/21 1230  linezolid (ZYVOX) tablet 600 mg        600 mg Oral Every 12 hours 08/18/21 1138 08/26/21 2359   08/18/21 1215  linezolid (ZYVOX) 100 MG/5ML suspension 600 mg  Status:  Discontinued        600 mg Per Tube Every 12 hours 08/18/21 1128 08/18/21 1138   08/17/21 1215  penicillin G potassium 4 Million Units in dextrose 5 % 250 mL IVPB  Status:  Discontinued        4 Million Units 250 mL/hr over 60 Minutes Intravenous Every 4 hours 08/17/21 1128 08/18/21 1128   08/13/21 1200  cefTRIAXone (ROCEPHIN) 2 g in sodium chloride 0.9 % 100 mL IVPB  Status:  Discontinued        2 g 200 mL/hr over 30 Minutes Intravenous Every 24 hours 08/13/21 1107 08/17/21 1128   08/12/21 1800  Ampicillin-Sulbactam (UNASYN) 3 g in sodium chloride 0.9 % 100 mL IVPB  Status:  Discontinued        3 g 200 mL/hr over 30 Minutes Intravenous Every 6 hours 08/12/21 1324 08/13/21 1107   08/11/21 1000  Ampicillin-Sulbactam (UNASYN) 3 g in sodium chloride 0.9 % 100 mL IVPB  Status:  Discontinued        3 g 200 mL/hr over 30 Minutes Intravenous Every 8 hours 08/11/21 0902 08/12/21 1324          Subjective: Much clearer today Florean Hoobler&Ox2-3 (knew hospital, didn't know exactly which) Remembered me from yesterday  Objective: Vitals:   08/18/21 0745 08/18/21 0924 08/18/21 1155 08/18/21 1539  BP: 139/75 140/68 108/67 113/70  Pulse: 87 85 96 90  Resp: _0 Temp: 98.7 F (37.1 C)  99.5 F (37.5 C) 98.8 F (37.1 C)  TempSrc: Axillary  Oral Axillary  SpO2:   99%   Weight:       Height:        Intake/Output Summary (Last 24 hours) at 08/18/2021 1618 Last data filed at 08/18/2021 0220 Gross per 24 hour  Intake 1185 ml  Output 200 ml  Net 985 ml   Filed Weights   08/10/21 1000 08/10/21 1627 08/17/21 0500  Weight: 75 kg 72.9 kg 78 kg    Examination:  General: No acute distress. Cardiovascular: RRR. Lungs: unlabored Abdomen: Soft, nontender, nondistended- cortrak in place. Neurological: Alert and oriented 2-3. Moves all extremities 4, LUE seems weaker than other extremiteis. Cranial nerves II through XII grossly intact. Skin: Warm and dry. No rashes or lesions. Extremities: No clubbing or cyanosis. No edema.  Data Reviewed: I have personally reviewed following labs and imaging studies  CBC: Recent Labs  Lab 08/14/21 1220 08/15/21 0431 08/16/21 0541 08/18/21 0426  WBC 8.3 6.7 8.2 6.1  NEUTROABS  --   --   --  3.8  HGB 12.3* 12.3* 11.6* 10.5*  HCT 37.0* 35.7* 33.5* 30.6*  MCV 93.7 91.1 92.0 91.9  PLT 196 208 199 287    Basic Metabolic Panel: Recent Labs  Lab 08/13/21 0347 08/14/21 0458 08/14/21 1220 08/14/21 1752 08/15/21 0431 08/15/21 1655 08/16/21 0541 08/16/21 1703 08/18/21 0426  NA 135  --  133*  --  133*  --  132*  --  127*  K 3.8  --  3.5  --  3.5  --  4.2  --  4.1  CL 99  --  96*  --  97*  --  99  --  94*  CO2 27  --  29  --  27  --  26  --  26  GLUCOSE 96  --  106*  --  132*  --  122*  --  133*  BUN 7*  --  9  --  11  --  14  --  13  CREATININE 0.82  --  0.77  --  0.77  --  0.81  --  0.77  CALCIUM 8.8*  --  8.8*  --  9.0  --  8.6*  --  8.6*  MG 1.7   < >  --    < > 1.8 1.9 1.7 2.0 1.6*  PHOS 3.5   < >  --    < > 3.9 3.6 3.6  3.6 3.9   < > = values in this interval not displayed.    GFR: Estimated Creatinine Clearance: 107 mL/min (by C-G formula based on SCr of 0.77 mg/dL).  Liver Function Tests: Recent Labs  Lab 08/18/21 0426  AST 56*  ALT 35  ALKPHOS 63  BILITOT 0.4  PROT 6.2*  ALBUMIN 1.6*    CBG: Recent  Labs  Lab 08/18/21 0741 08/18/21 0750 08/18/21 1150 08/18/21 1151 08/18/21 1537  GLUCAP 48* 95 55* 104* 83     Recent Results (from the past 240 hour(s))  Resp Panel by RT-PCR (Flu Orbin Mayeux&B, Covid) Nasopharyngeal Swab     Status: None   Collection Time: 08/10/21 12:15 PM   Specimen: Nasopharyngeal Swab; Nasopharyngeal(NP) swabs in vial transport medium  Result Value Ref Range Status   SARS Coronavirus 2 by RT PCR NEGATIVE NEGATIVE Final    Comment: (NOTE) SARS-CoV-2 target nucleic acids are NOT DETECTED.  The SARS-CoV-2 RNA is generally detectable in upper respiratory specimens during the acute phase of infection. The lowest concentration of SARS-CoV-2 viral copies this assay can detect is 138 copies/mL. Evyn Putzier negative result does not preclude SARS-Cov-2 infection and should not be used as the sole basis for treatment or other patient management decisions. Murriel Holwerda negative result may occur with  improper specimen collection/handling, submission of specimen other than nasopharyngeal swab, presence of viral mutation(s) within the areas targeted by this assay, and inadequate number of viral copies(<138 copies/mL). Loriel Diehl negative result must be combined with clinical observations, patient history, and epidemiological information. The expected result is Negative.  Fact Sheet for Patients:  EntrepreneurPulse.com.au  Fact Sheet for Healthcare Providers:  IncredibleEmployment.be  This test is no t yet approved or cleared by the [place] FDA and  has been authorized for detection and/or diagnosis of SARS-CoV-2 by FDA under an Emergency Use Authorization (EUA). This EUA will remain  in effect (meaning this test can be used) for the duration of the COVID-19 declaration under Section 564(b)(1) of the Act, 21 U.S.C.section 360bbb-3(b)(1), unless the authorization is terminated  or revoked sooner.       Influenza Faiga Stones by PCR NEGATIVE NEGATIVE Final   Influenza B  by PCR NEGATIVE NEGATIVE Final    Comment: (NOTE) The Xpert Xpress SARS-CoV-2/FLU/RSV plus assay is intended as an aid in the diagnosis of influenza from Nasopharyngeal swab specimens and should not be used as Teletha Petrea sole basis for treatment. Nasal washings and aspirates are unacceptable for Xpert Xpress SARS-CoV-2/FLU/RSV testing.  Fact Sheet for Patients: EntrepreneurPulse.com.au  Fact Sheet for Healthcare Providers: IncredibleEmployment.be  This test is not yet approved or cleared by the [place] FDA and has been authorized for detection and/or diagnosis of SARS-CoV-2 by FDA under an Emergency Use Authorization (EUA). This EUA will remain in effect (meaning this test can be used) for the duration of the COVID-19 declaration under Section 564(b)(1) of the Act, 21 U.S.C. section 360bbb-3(b)(1), unless the authorization is terminated or revoked.  Performed at [facility], [place] [address] [place], [place] [address]   Culture, blood (routine x 2)     Status: Abnormal   Collection Time: 08/10/21  2:00 PM   Specimen: BLOOD LEFT HAND  Result Value Ref Range Status   Specimen Description BLOOD LEFT HAND  Final   Special Requests   Final    BOTTLES DRAWN AEROBIC ONLY Blood Culture results may not be optimal due to an inadequate volume of blood received in culture bottles   Culture  Setup Time  Final    GRAM POSITIVE COCCI IN CHAINS AEROBIC BOTTLE ONLY CRITICAL RESULT CALLED TO, READ BACK BY AND VERIFIED WITH: V BRYK,[email or web address]  08/12/21 [place] Performed at [facility], [place] [address] [place], [place] [address]    Culture STREPTOCOCCUS ANGINOSIS (Khrystyne Arpin)  Final   Report Status 08/14/2021 FINAL  Final   Organism ID, Bacteria STREPTOCOCCUS ANGINOSIS  Final      Susceptibility   Streptococcus anginosis - MIC*    PENICILLIN 0.12 SENSITIVE Sensitive     CEFTRIAXONE 0.5 SENSITIVE Sensitive     ERYTHROMYCIN >=8 RESISTANT Resistant      LEVOFLOXACIN 1 SENSITIVE Sensitive     VANCOMYCIN 0.5 SENSITIVE Sensitive     * STREPTOCOCCUS ANGINOSIS  Blood Culture ID Panel (Reflexed)     Status: Abnormal   Collection Time: 08/10/21  2:00 PM  Result Value Ref Range Status   Enterococcus faecalis NOT DETECTED NOT DETECTED Final   Enterococcus Faecium NOT DETECTED NOT DETECTED Final   Listeria monocytogenes NOT DETECTED NOT DETECTED Final   Staphylococcus species NOT DETECTED NOT DETECTED Final   Staphylococcus aureus (BCID) NOT DETECTED NOT DETECTED Final   Staphylococcus epidermidis NOT DETECTED NOT DETECTED Final   Staphylococcus lugdunensis NOT DETECTED NOT DETECTED Final   Streptococcus species DETECTED (Alea Ryer) NOT DETECTED Final    Comment: Not Enterococcus species, Streptococcus agalactiae, Streptococcus pyogenes, or Streptococcus pneumoniae. CRITICAL RESULT CALLED TO, READ BACK BY AND VERIFIED WITH: V BRYK,[email or web address]_1  08/12/21 [place]    Streptococcus agalactiae NOT DETECTED NOT DETECTED Final   Streptococcus pneumoniae NOT DETECTED NOT DETECTED Final   Streptococcus pyogenes NOT DETECTED NOT DETECTED Final   Vivika Poythress.calcoaceticus-baumannii NOT DETECTED NOT DETECTED Final   Bacteroides fragilis NOT DETECTED NOT DETECTED Final   Enterobacterales NOT DETECTED NOT DETECTED Final   Enterobacter cloacae complex NOT DETECTED NOT DETECTED Final   Escherichia coli NOT DETECTED NOT DETECTED Final   Klebsiella aerogenes NOT DETECTED NOT DETECTED Final   Klebsiella oxytoca NOT DETECTED NOT DETECTED Final   Klebsiella pneumoniae NOT DETECTED NOT DETECTED Final   Proteus species NOT DETECTED NOT DETECTED Final   Salmonella species NOT DETECTED NOT DETECTED Final   Serratia marcescens NOT DETECTED NOT DETECTED Final   Haemophilus influenzae NOT DETECTED NOT DETECTED Final   Neisseria meningitidis NOT DETECTED NOT DETECTED Final   Pseudomonas aeruginosa NOT DETECTED NOT DETECTED Final   Stenotrophomonas maltophilia NOT DETECTED NOT DETECTED Final    Candida albicans NOT DETECTED NOT DETECTED Final   Candida auris NOT DETECTED NOT DETECTED Final   Candida glabrata NOT DETECTED NOT DETECTED Final   Candida krusei NOT DETECTED NOT DETECTED Final   Candida parapsilosis NOT DETECTED NOT DETECTED Final   Candida tropicalis NOT DETECTED NOT DETECTED Final   Cryptococcus neoformans/gattii NOT DETECTED NOT DETECTED Final    Comment: Performed at [facility], [address], [place], [place] [address]  MRSA Next Gen by PCR, Nasal     Status: None   Collection Time: 08/10/21  4:13 PM   Specimen: Urine, Clean Catch; Nasal Swab  Result Value Ref Range Status   MRSA by PCR Next Gen NOT DETECTED NOT DETECTED Final    Comment: (NOTE) The GeneXpert MRSA Assay (FDA approved for NASAL specimens only), is one component of Shahil Speegle comprehensive MRSA colonization surveillance program. It is not intended to diagnose MRSA infection nor to guide or monitor treatment for MRSA infections. Test performance is not FDA approved in patients less than 56 years old. Performed at [facility], [place] [address], [place], [place] [address]   Culture, blood (routine x 2)     Status: Abnormal   Collection Time: 08/10/21  4:21 PM   Specimen: BLOOD  Result Value Ref Range Status   Specimen Description BLOOD SITE NOT SPECIFIED  Final   Special Requests   Final    BOTTLES DRAWN AEROBIC AND ANAEROBIC Blood Culture results may not be optimal due to an inadequate volume of blood received in culture bottles   Culture  Setup Time   Final    GRAM POSITIVE COCCI IN CHAINS AEROBIC BOTTLE ONLY CRITICAL VALUE NOTED.  VALUE IS CONSISTENT WITH PREVIOUSLY REPORTED AND CALLED VALUE.    Culture (Tally Mattox)  Final    STREPTOCOCCUS ANGINOSIS SUSCEPTIBILITIES PERFORMED ON PREVIOUS CULTURE WITHIN THE LAST 5 DAYS. Performed at [facility], [place] [address], [place], [place] [address]    Report Status 08/14/2021 FINAL  Final  Culture, Respiratory w Gram Stain     Status: None    Collection Time: 08/11/21  8:09 AM   Specimen: Tracheal Aspirate; Respiratory  Result Value Ref Range Status   Specimen Description TRACHEAL ASPIRATE  Final   Special Requests NONE  Final   Gram Stain   Final    FEW WBC PRESENT,BOTH PMN AND MONONUCLEAR NO ORGANISMS SEEN    Culture   Final    Normal respiratory flora-no Staph aureus or Pseudomonas seen Performed at [facility], [address], [place], [place] [address]    Report Status 08/13/2021 FINAL  Final  Culture, blood (routine x 2)     Status: None   Collection Time: 08/12/21  7:57 AM   Specimen: BLOOD  Result Value Ref Range Status   Specimen Description BLOOD RIGHT ANTECUBITAL  Final   Special Requests   Final    BOTTLES DRAWN AEROBIC AND ANAEROBIC Blood Culture results may not be optimal due to an excessive volume of blood received in culture bottles   Culture   Final    NO GROWTH 5 DAYS Performed at [facility], [place] [address] [place], [place] [address]    Report Status 08/17/2021 FINAL  Final  Culture, blood (routine x 2)     Status: None   Collection Time: 08/12/21  8:14 AM   Specimen: BLOOD LEFT HAND  Result Value Ref Range Status   Specimen Description BLOOD LEFT HAND  Final   Special Requests   Final    BOTTLES DRAWN AEROBIC AND ANAEROBIC Blood Culture adequate volume   Culture   Final    NO GROWTH 5 DAYS Performed at [facility], [place] [address], [place], [place] [address]    Report Status 08/17/2021 FINAL  Final         Radiology Studies: CT CHEST ABDOMEN PELVIS W CONTRAST  Result Date: 08/17/2021 CLINICAL DATA:  Sepsis EXAM: CT CHEST, ABDOMEN, AND PELVIS WITH CONTRAST TECHNIQUE: Multidetector CT imaging of the chest, abdomen and pelvis was performed following the standard protocol during bolus administration of intravenous contrast. CONTRAST:  123m OMNIPAQUE IOHEXOL 350 MG/ML SOLN COMPARISON:  08/14/2021, 02/12/2020 FINDINGS: CT CHEST FINDINGS Cardiovascular: The heart is  unremarkable without pericardial effusion. No evidence of thoracic aortic aneurysm or dissection. Minimal atherosclerosis of the aorta and coronary vasculature. 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 Mediastinum/Nodes: Enteric catheter extends into the gastric lumen. Thyroid and trachea are unremarkable. Subcentimeter right paratracheal lymph nodes may  be reactive. No pathologic adenopathy. Lungs/Pleura: Large area of cavitation in volume loss within the right apex again noted. Innumerable bilateral nodular opacities are again seen, increased in size and number since previous study. The waxing and waning appearance since 2016 suggests chronic atypical infection such as mycobacterium or fungal organism. Trace bilateral pleural effusions are noted. No pneumothorax. Central airways are patent. Musculoskeletal: There are no acute displaced rib fractures. Chronic bilateral healed rib fractures again noted. Reconstructed images demonstrate no additional findings. CT ABDOMEN PELVIS FINDINGS Hepatobiliary: There are small calcified gallstones without evidence of acute cholecystitis. Hepatic steatosis again noted, otherwise the liver is unremarkable. Pancreas: Unremarkable. No pancreatic ductal dilatation or surrounding inflammatory changes. Spleen: Normal in size without focal abnormality. Adrenals/Urinary Tract: The kidneys enhance normally and symmetrically. No urinary tract calculi or obstructive uropathy. The adrenals are unremarkable. The bladder is decompressed, limiting its evaluation. Stomach/Bowel: No bowel obstruction or ileus. Normal appendix right lower quadrant. No bowel wall thickening or inflammatory change. Vascular/Lymphatic: Aortic atherosclerosis. No enlarged abdominal or pelvic lymph nodes. Reproductive: Prostate is unremarkable. Other: No free fluid or free intraperitoneal gas. No abdominal wall hernia. Musculoskeletal: No acute bony  abnormalities. Chronic compression deformity within the superior endplate of L5. Reconstructed images demonstrate no additional findings. IMPRESSION: 1. Persistent cavitary changes within the right upper lobe, with increased prominence of the bilateral nodular airspace disease seen previously. Given the persistence over time, findings are compatible with chronic infection such as mycobacterium or fungus. 2. Trace bilateral pleural effusions. 3. Subcentimeter reactive right paratracheal lymph nodes. No pathologic adenopathy within the chest, abdomen, or pelvis. 4. Cholelithiasis without cholecystitis. 5. Hepatic steatosis. 6.  Aortic Atherosclerosis (ICD10-I70.0). Electronically Signed   By: Randa Ngo M.D.   On: 08/17/2021 20:18   CT MAXILLOFACIAL W CONTRAST  Result Date: 08/17/2021 CLINICAL DATA:  Sepsis, bacteremia EXAM: CT MAXILLOFACIAL WITH CONTRAST TECHNIQUE: Multidetector CT imaging of the maxillofacial structures was performed with intravenous contrast. Multiplanar CT image reconstructions were also generated. CONTRAST:  17m OMNIPAQUE IOHEXOL 350 MG/ML SOLN COMPARISON:  Brain MRI 08/14/2021, CT head 08/10/2021 FINDINGS: Osseous: The facial bones are unremarkable, with no evidence of fracture or osseous destruction. There are bulky anterior osteophytes throughout the imaged cervical spine. Orbits: The globes and orbits are unremarkable. Sinuses: The paranasal sinuses are clear. The mastoid air cells are clear. Soft tissues: Unremarkable. There is no evidence of abscess. There is no lymphadenopathy in the neck to the level imaged. Limited intracranial: The imaged portions of the intracranial compartment are unremarkable. 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 There is calcified atherosclerotic plaque of the bilateral carotid bulbs and cavernous ICAs without hemodynamically significant stenosis or occlusion. Other: An enteric catheter is in place. IMPRESSION: No  evidence of abscess or other acute finding. Electronically Signed   By: PValetta MoleM.D.   On: 08/17/2021 19:42   VAS [place]PPER EXTREMITY VENOUS DUPLEX  Result Date: 08/18/2021 UPPER VENOUS STUDY  Patient Name:  AJAHMARI ESBENSHADE Date of Exam:   08/18/2021 Medical Rec #: [id number]      Accession #:    [id number] of Birth: [birth year]      Patient Gender: M Patient Age:   89years [facility]      VAS [place]ER EXTREMITY VENOUS DUPLEX Referring Phys: Surah Pelley POWELL JR --------------------------------------------------------------------------------  Indications: Edema Comparison Study: No prior study Performing Technologist: MMaudry MayhewMHA, RDMS, RVT, RDCS  Examination Guidelines:  Manraj Yeo complete evaluation includes B-mode imaging, spectral Doppler, color Doppler, and power Doppler as needed of all accessible portions of each vessel. Bilateral testing is considered an integral part of Jeriann Sayres complete examination. Limited examinations for reoccurring indications may be performed as noted.  Right Findings: +----------+------------+---------+-----------+----------+-------+ RIGHT     CompressiblePhasicitySpontaneousPropertiesSummary +----------+------------+---------+-----------+----------+-------+ IJV           Full       Yes       Yes                      +----------+------------+---------+-----------+----------+-------+ Subclavian    Full       Yes       Yes                      +----------+------------+---------+-----------+----------+-------+ Axillary      Full       Yes       Yes                      +----------+------------+---------+-----------+----------+-------+ Brachial      Full       Yes       Yes                      +----------+------------+---------+-----------+----------+-------+ Radial        Full                                          +----------+------------+---------+-----------+----------+-------+ Ulnar         Full                                           +----------+------------+---------+-----------+----------+-------+ Cephalic      Full                                          +----------+------------+---------+-----------+----------+-------+ Basilic       Full                                          +----------+------------+---------+-----------+----------+-------+  Left Findings: +----------+------------+---------+-----------+----------+-----------------+ LEFT      CompressiblePhasicitySpontaneousProperties     Summary      +----------+------------+---------+-----------+----------+-----------------+ IJV           Full       Yes       Yes                                +----------+------------+---------+-----------+----------+-----------------+ Subclavian    None                 No               Age Indeterminate +----------+------------+---------+-----------+----------+-----------------+ Axillary      Full       Yes       Yes                                +----------+------------+---------+-----------+----------+-----------------+  Brachial      Full       Yes       Yes                                +----------+------------+---------+-----------+----------+-----------------+ Radial        Full                                                    +----------+------------+---------+-----------+----------+-----------------+ Ulnar         Full                                                    +----------+------------+---------+-----------+----------+-----------------+ Cephalic      None                                        Acute       +----------+------------+---------+-----------+----------+-----------------+  Summary:  Right: No evidence of deep vein thrombosis in the upper extremity. No evidence of superficial vein thrombosis in the upper extremity.  Left: Findings consistent with acute superficial vein thrombosis involving the left cephalic vein.  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  *See table(s) above for measurements and observations.    Preliminary         Scheduled Meds:  carvedilol  6.25 mg Per Tube BID WC   chlorhexidine gluconate (MEDLINE KIT)  15 mL Mouth Rinse BID   Chlorhexidine Gluconate Cloth  6 each Topical Daily   dextrose       feeding supplement (PROSource TF)  45 mL Per Tube BID   folic acid  1 mg Per Tube Daily   insulin aspart  0-9 Units Subcutaneous Q4H   levETIRAcetam  1,000 mg Per Tube BID   linezolid  600 mg Oral Q12H   multivitamin with minerals  1 tablet Per Tube Daily   [START ON 08/25/2021] thiamine  100 mg Oral Daily   Continuous Infusions:  sodium chloride 5 mL/hr at 08/17/21 1600   sodium chloride 500 mL (08/17/21 2155)   feeding supplement (OSMOLITE 1.5 CAL) 1,000 mL (08/18/21 1531)   heparin 1,300 Units/hr (08/18/21 1230)   thiamine injection 500 mg (08/18/21 1433)   Followed by   Derrill Memo ON 08/20/2021] thiamine injection       LOS: 8 days    Time spent: over 30 min    Fayrene Helper, MD [facility]   To contact the attending provider between 7A-7P or the covering provider during after hours 7P-7A, please log into the web site [email or web address] and access using universal  password for that web site. If you do not have the password, please call the hospital operator.  08/18/2021, 4:18 PM

## 2021-08-19 ENCOUNTER — Other Ambulatory Visit: Payer: Self-pay

## 2021-08-19 DIAGNOSIS — J984 Other disorders of lung: Secondary | ICD-10-CM | POA: Diagnosis not present

## 2021-08-19 DIAGNOSIS — F10239 Alcohol dependence with withdrawal, unspecified: Principal | ICD-10-CM

## 2021-08-19 DIAGNOSIS — F10939 Alcohol use, unspecified with withdrawal, unspecified: Secondary | ICD-10-CM

## 2021-08-19 DIAGNOSIS — I5021 Acute systolic (congestive) heart failure: Secondary | ICD-10-CM | POA: Diagnosis not present

## 2021-08-19 DIAGNOSIS — R7881 Bacteremia: Secondary | ICD-10-CM | POA: Diagnosis not present

## 2021-08-19 DIAGNOSIS — N183 Chronic kidney disease, stage 3 unspecified: Secondary | ICD-10-CM | POA: Insufficient documentation

## 2021-08-19 DIAGNOSIS — R1310 Dysphagia, unspecified: Secondary | ICD-10-CM

## 2021-08-19 LAB — CBC WITH DIFFERENTIAL/PLATELET
Abs Immature Granulocytes: 0.08 10*3/uL — ABNORMAL HIGH (ref 0.00–0.07)
Basophils Absolute: 0 10*3/uL (ref 0.0–0.1)
Basophils Relative: 1 %
Eosinophils Absolute: 0.2 10*3/uL (ref 0.0–0.5)
Eosinophils Relative: 3 %
HCT: 32.6 % — ABNORMAL LOW (ref 39.0–52.0)
Hemoglobin: 11.2 g/dL — ABNORMAL LOW (ref 13.0–17.0)
Immature Granulocytes: 1 %
Lymphocytes Relative: 14 %
Lymphs Abs: 1 10*3/uL (ref 0.7–4.0)
MCH: 32.1 pg (ref 26.0–34.0)
MCHC: 34.4 g/dL (ref 30.0–36.0)
MCV: 93.4 fL (ref 80.0–100.0)
Monocytes Absolute: 0.6 10*3/uL (ref 0.1–1.0)
Monocytes Relative: 9 %
Neutro Abs: 4.8 10*3/uL (ref 1.7–7.7)
Neutrophils Relative %: 72 %
Platelets: 249 10*3/uL (ref 150–400)
RBC: 3.49 MIL/uL — ABNORMAL LOW (ref 4.22–5.81)
RDW: 14.3 % (ref 11.5–15.5)
WBC: 6.7 10*3/uL (ref 4.0–10.5)
nRBC: 0 % (ref 0.0–0.2)

## 2021-08-19 LAB — GLUCOSE, CAPILLARY
Glucose-Capillary: 115 mg/dL — ABNORMAL HIGH (ref 70–99)
Glucose-Capillary: 116 mg/dL — ABNORMAL HIGH (ref 70–99)
Glucose-Capillary: 128 mg/dL — ABNORMAL HIGH (ref 70–99)
Glucose-Capillary: 133 mg/dL — ABNORMAL HIGH (ref 70–99)
Glucose-Capillary: 55 mg/dL — ABNORMAL LOW (ref 70–99)
Glucose-Capillary: 58 mg/dL — ABNORMAL LOW (ref 70–99)
Glucose-Capillary: 63 mg/dL — ABNORMAL LOW (ref 70–99)
Glucose-Capillary: 64 mg/dL — ABNORMAL LOW (ref 70–99)
Glucose-Capillary: 75 mg/dL (ref 70–99)

## 2021-08-19 LAB — HEPARIN LEVEL (UNFRACTIONATED)
Heparin Unfractionated: 0.26 IU/mL — ABNORMAL LOW (ref 0.30–0.70)
Heparin Unfractionated: 0.39 IU/mL (ref 0.30–0.70)
Heparin Unfractionated: 0.35 IU/mL (ref 0.30–0.70)

## 2021-08-19 LAB — COMPREHENSIVE METABOLIC PANEL
ALT: 35 U/L (ref 0–44)
AST: 50 U/L — ABNORMAL HIGH (ref 15–41)
Albumin: 1.8 g/dL — ABNORMAL LOW (ref 3.5–5.0)
Alkaline Phosphatase: 65 U/L (ref 38–126)
Anion gap: 7 (ref 5–15)
BUN: 10 mg/dL (ref 8–23)
CO2: 27 mmol/L (ref 22–32)
Calcium: 8.9 mg/dL (ref 8.9–10.3)
Chloride: 94 mmol/L — ABNORMAL LOW (ref 98–111)
Creatinine, Ser: 0.76 mg/dL (ref 0.61–1.24)
GFR, Estimated: >60 mL/min (ref >60)
Glucose, Bld: 107 mg/dL — ABNORMAL HIGH (ref 70–99)
Potassium: 4.3 mmol/L (ref 3.5–5.1)
Sodium: 128 mmol/L — ABNORMAL LOW (ref 135–145)
Total Bilirubin: 0.3 mg/dL (ref 0.3–1.2)
Total Protein: 6.5 g/dL (ref 6.5–8.1)

## 2021-08-19 LAB — MAGNESIUM: Magnesium: 1.6 mg/dL — ABNORMAL LOW (ref 1.7–2.4)

## 2021-08-19 LAB — PHOSPHORUS: Phosphorus: 4.6 mg/dL (ref 2.5–4.6)

## 2021-08-19 MED ORDER — JEVITY 1.5 CAL/FIBER PO LIQD
1000.0000 mL | ORAL | Status: DC
  Administered 2021-08-19 – 2021-08-20 (×2): 1000 mL
  Filled 2021-08-19 (×4): qty 1000

## 2021-08-19 MED ORDER — LORAZEPAM 2 MG/ML IJ SOLN
1.0000 mg | INTRAMUSCULAR | Status: DC | PRN
  Administered 2021-08-19 – 2021-08-20 (×4): 1 mg via INTRAVENOUS
  Filled 2021-08-19 (×4): qty 1

## 2021-08-19 MED ORDER — SODIUM CHLORIDE 0.9 % IV SOLN: INTRAVENOUS | Status: DC

## 2021-08-19 MED ORDER — INSULIN ASPART 100 UNIT/ML IJ SOLN: 0.0000 [IU] | Freq: Three times a day (TID) | INTRAMUSCULAR | Status: DC

## 2021-08-19 MED ORDER — COVID-19 MRNA VACC (MODERNA) 100 MCG/0.5ML IM SUSP
0.5000 mL | Freq: Once | INTRAMUSCULAR | Status: AC
  Filled 2021-08-19: qty 0.5

## 2021-08-19 NOTE — Progress Notes (Signed)
ANTICOAGULATION CONSULT NOTE Pharmacy Consult for Heparin  Indication: DVT/VTE   No Known Allergies  Patient Measurements: Height: 6\' 1"  (185.4 cm) Weight: 78.1 kg (172 lb 2.9 oz) IBW/kg (Calculated) : 79.9  Heparin Dosing Weight: 78 kg  Vital Signs: Temp: 98.6 F (37 C) (09/14 0344) Temp Source: Axillary (09/14 0344) BP: 128/87 (09/14 0344) Pulse Rate: 89 (09/14 0344)  Labs: Recent Labs    08/16/21 0541 08/18/21 0426 08/18/21 1758 08/19/21 0436  HGB 11.6* 10.5*  --  11.2*  HCT 33.5* 30.6*  --  32.6*  PLT 199 238  --  249  HEPARINUNFRC  --   --  0.19* 0.26*  CREATININE 0.81 0.77  --   --      Estimated Creatinine Clearance: 107.1 mL/min (by C-G formula based on SCr of 0.77 mg/dL).  Assessment: 60 y.o. male with possible LUE DVT for heparin  Goal of Therapy:  Heparin level 0.3-0.7 units/ml Monitor platelets by anticoagulation protocol: Yes   Plan:  Increase Heparin  1700 units/hr Check heparin level in 8 hours.  77, PharmD, BCPS

## 2021-08-19 NOTE — Assessment & Plan Note (Signed)
Due to alcohol withdrawal.

## 2021-08-19 NOTE — Progress Notes (Signed)
Physical Therapy Treatment Patient Details Name: Frederick [name] MRN: [id number] DOB: 02-May-1961 Today's Date: 08/19/2021   History of Present Illness Pt is a 60 year old man admitted on 08/10/21 with ETOH induced seizure. Intubated 08/10/21 to 08/12/21. Head CT negative for acute abnormality. Pt on CIWA precautions. 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 Cortrak placed 08/14/21. Left UE DVT found on 9/13, pt placed on heparin IV 12:30pm 9/13. 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    PT Comments    Pt received in supine, pt attempting to slide BLE over edge of bed and requesting to get up to bathroom. Pt performed supine>sit transfer with +2 maxA and needing up to +2 maxA for seated balance. Pt with poor command following and increased agitation once seated, unsafe to assess standing transfers this date. Per RN, pt just received ativan and plan to perform peri-care/bath for patient once medicine takes effect but pt too aggressive and not following instructions for self-care/rolling for toileting assist at this time. TotalA +2 to return pt to safe position in supine with HOB >30 degrees for safety due to cortrak. Pt continues to benefit from PT services to progress toward functional mobility goals. Plan to attempt standing transfers next session with +2 assist for safety if pt able to follow commands.   Recommendations for follow up therapy are one component of a multi-disciplinary discharge planning process, led by the attending physician.  Recommendations may be updated based on patient status, additional functional criteria and insurance authorization.  Follow Up Recommendations  SNF;Supervision/Assistance - 24 hour     Equipment Recommendations  None recommended by PT    Recommendations for Other Services       Precautions / Restrictions Precautions Precautions: Fall Precaution  Comments: cortrak, agitation/CIWA Restrictions Weight Bearing Restrictions: No     Mobility  Bed Mobility Overal bed mobility: Needs Assistance Bed Mobility: Supine to Sit;Sit to Supine     Supine to sit: Max assist;+2 for physical assistance;+2 for safety/equipment Sit to supine: +2 for physical assistance;Total assist   General bed mobility comments: multimodal cues for technique, assist for all aspects, pt able to pull up with bed rails/LUE assist but once EOB, very poor safety and pt with increased agitation so deferred standing or transfer training. Pt with poor command following throughout after sitting upright. totalA +2 to return to supine via helicopter method with shoulder/BLE support.    Transfers                 General transfer comment: Attempted facilitation of sit>stand but pt with posterior lean and became increasingly more agitated, yelling at therapist about walking to the bathroom and states he does not use a wheelchair (he does at baseline per family). Pt very agitated, not safe to assess further.  Ambulation/Gait             General Gait Details: ambulates minimally at baseline, uses w/c for primary mobility.   Stairs             Wheelchair Mobility    Modified Rankin (Stroke Patients Only)       Balance Overall balance assessment: Needs assistance Sitting-balance support: Feet supported Sitting balance-Leahy Scale: Zero Sitting balance - Comments: mod to max assist x 3 minutes with +1-2 trunk assist Postural control: Posterior lean;Right lateral lean  Cognition Arousal/Alertness: Awake/alert Behavior During Therapy: Agitated;Impulsive;Restless Overall Cognitive Status: Impaired/Different from baseline Area of Impairment: Attention;Following commands;Safety/judgement;Problem solving;Memory;Orientation;Awareness                 Orientation Level: Disoriented  to;Place;Time;Situation Current Attention Level: Focused Memory: Decreased short-term memory;Decreased recall of precautions Following Commands: Follows one step commands inconsistently (with multimodal cues) Safety/Judgement: Decreased awareness of safety;Decreased awareness of deficits Awareness: Intellectual Problem Solving: Slow processing;Difficulty sequencing;Requires verbal cues;Requires tactile cues General Comments: Pt perseverating on wanting to walk to bathroom although restless and with poor safety/situational awareness. Pt attempting to pull IV and cortrak out and not oriented to place. Attempted to reorient pt to situation and fall risk prevention once seated EOB but pt with decreased safety and pt agitated/pushing at RN/therapist so returned pt to supine with +2 assist and bed rails up/bed alarm on for safety. Pt with poor command following throughout.      Exercises Other Exercises Other Exercises: supine BLE AAROM: hip abduction/adduction x5 reps, pt unable to follow other commands and agitated when therapist attempting to engage him further.    General Comments General comments (skin integrity, edema, etc.): Difficult to assess vitals due to pt restlessness/impulsivity, no increased WOB but pt increasingly agitated. Pt pulling at lines and needing constant redirection.      Pertinent Vitals/Pain Pain Assessment: Faces Faces Pain Scale: Hurts little more Pain Location: pt grimacing frequently while supine and seated, denies pain but appears uncomfortable (also on CIWA protocol) Pain Descriptors / Indicators: Discomfort;Grimacing Pain Intervention(s): Limited activity within patient's tolerance;Monitored during session;Repositioned;Premedicated before session (pt given ativan due to agitation but still appears uncomfortable/restless)    Home Living                      Prior Function            PT Goals (current goals can now be found in the care plan section)  Acute Rehab PT Goals Patient Stated Goal: to go to the bathroom PT Goal Formulation: Patient unable to participate in goal setting Time For Goal Achievement: 08/29/21 Potential to Achieve Goals: Fair Progress towards PT goals: Not progressing toward goals - comment (too agitated to follow commands this date)    Frequency    Min 2X/week      PT Plan Current plan remains appropriate    Co-evaluation              AM-PAC PT "6 Clicks" Mobility   Outcome Measure  Help needed turning from your back to your side while in a flat bed without using bedrails?: A Lot Help needed moving from lying on your back to sitting on the side of a flat bed without using bedrails?: Total Help needed moving to and from a bed to a chair (including a wheelchair)?: Total Help needed standing up from a chair using your arms (e.g., wheelchair or bedside chair)?: Total Help needed to walk in hospital room?: Total Help needed climbing 3-5 steps with a railing? : Total 6 Click Score: 7    End of Session   Activity Tolerance: Treatment limited secondary to agitation Patient left: in bed;with call bell/phone within reach;with bed alarm set (3 rails up, bed alarm on, NT notified to check in on pt he may need a bath (per RN pt was bathed recently but difficult to check pt diaper at this time due to agitation)) Nurse Communication: Mobility status;Other (comment) (pt may need pericare cleanup, too agitated to follow commands  for therapist to check) PT Visit Diagnosis: Muscle weakness (generalized) (M62.81)     Time: 9147-8295 PT Time Calculation (min) (ACUTE ONLY): 15 min  Charges:  $Therapeutic Activity: 8-22 mins                     Markitta Ausburn P., PTA Acute Rehabilitation Services Pager: [phone] Office: [phone]    Angus Palms 08/19/2021, 5:49 PM

## 2021-08-19 NOTE — Assessment & Plan Note (Signed)
Nutrition consulted. Moderate Malnutrition related to chronic illness (ETOH abuse) as evidenced by mild fat depletion, mild muscle depletion, percent weight loss.  Ongoing, being addressed via initiation of enteral nutrition on 08/14/21

## 2021-08-19 NOTE — Assessment & Plan Note (Signed)
EF 50-55% by TEE, although 25-30% by TTE. Patient to undergo cardiac MRI on 9/15 and LHC in the future. Cardiac evaluation is to take place prior to bronchoscopy. I appreciate cardiology's assistance. Continue monitoring on telemetry.

## 2021-08-19 NOTE — Progress Notes (Signed)
Nutrition Follow-up  DOCUMENTATION CODES:  Non-severe (moderate) malnutrition in context of chronic illness  INTERVENTION:  Continue TF via Cortrak: -Transition to Jevity 1.5 @ 55 ml/hr (1320 ml/day) - ProSource TF 45 ml BID  Tube feeding regimen provides 2060 kcal, 106 grams of protein, and 1003 ml of H2O.  - MVI with minerals daily per tube  NUTRITION DIAGNOSIS:  Moderate Malnutrition related to chronic illness (ETOH abuse) as evidenced by mild fat depletion, mild muscle depletion, percent weight loss. 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 Pt admitted with acute respiratory failure in the setting of new onset seizures and suspect ETOH withdrawal.  9/07 - extubated 9/09 - Cortrak placed (gastric tip confirmed via xray)  TEE negative for vegetations. Cardiology now signed off. ID following due to bacteremia. Pt remains NPO, tolerating TF via Cortrak per RN. Current TF orders: Osmolite 1.5 @ 79m/hr with 454mProsource TF BID  Admit weight: 75 kg Current weight: 78.1 kg  Medications: folic acid, SSI, MVI with minerals, thiamine IVF: NS @ 7528mr  Labs: Na 128 (L), Mg 1.6 (L) CBGs: 58-128 x 24 hours  UOP: 2.8 L x 24 hours I/Os: +6.2 L since admit  Diet Order:   Diet Order     None      EDUCATION NEEDS:  Not appropriate for education at this time  Skin:  Skin Assessment: Reviewed RN Assessment  Last BM:  9/14 type 7  Height:  Ht Readings from Last 1 Encounters:  08/10/21 _0  (1.854 m)   Weight:  Wt Readings from Last 1  Encounters:  08/19/21 78.1 kg   Ideal Body Weight:  83.6 kg  BMI:  Body mass index is 22.72 kg/m.  Estimated Nutritional Needs:  Kcal:  1900-2100 Protein:  95-115 grams Fluid:  > 1.8 L    AmaLarkin InaS, RD, LDN (she/her/hers) RD pager number and weekend/on-call pager number located in [facility]

## 2021-08-19 NOTE — Assessment & Plan Note (Signed)
Dysphagia  Severe Aspiration Risk  Moderate protein calorie malnutrition Cortrak in place with tube feeds Follow for improvement

## 2021-08-19 NOTE — Assessment & Plan Note (Signed)
Pt required mechanical ventilation upon admission in order to protect his airway. He was placed on CIWA protocol and that has since been discontinued. However, per nursing, the patient has become very agitated and restless this afternoon. Will restart as needed ativan.

## 2021-08-19 NOTE — Progress Notes (Signed)
ANTICOAGULATION CONSULT NOTE - Follow Up Consult  Pharmacy Consult for Heparin Indication:  VTE  No Known Allergies  Patient Measurements: Height: 6\' 1"  (185.4 cm) Weight: 78.1 kg (172 lb 2.9 oz) IBW/kg (Calculated) : 79.9 Heparin Dosing Weight:  78.1 kg  Vital Signs: Temp: 98.3 F (36.8 C) (09/14 1946) Temp Source: Axillary (09/14 1634) BP: 132/76 (09/14 1946) Pulse Rate: 97 (09/14 1946)  Labs: Recent Labs    08/18/21 0426 08/18/21 1758 08/19/21 0436 08/19/21 1245 08/19/21 2017  HGB 10.5*  --  11.2*  --   --   HCT 30.6*  --  32.6*  --   --   PLT 238  --  249  --   --   HEPARINUNFRC  --    < > 0.26* 0.39 0.35  CREATININE 0.77  --  0.76  --   --    < > = values in this interval not displayed.    Estimated Creatinine Clearance: 107.1 mL/min (by C-G formula based on SCr of 0.76 mg/dL).  Assessment:  Anticoag: None PTA, Enox 40 > hep gtt - 9/13 07-29-1973 doppler: acute superficial vein thrombosis involving the left cephalic vein and age indeterminate deep vein thrombosis involving the left subclavian vein.  - HL 0.35 in goal.  Goal of Therapy:  Heparin level 0.3-0.7 units/ml Monitor platelets by anticoagulation protocol: Yes   Plan:  Con't IV heparin at 1700 units/hr Daily HL and CBC   Tanijah Morais S. [place], PharmD, BCPS Clinical Staff Pharmacist [email or web address] Merilynn Finland Stillinger 08/19/2021,9:16 PM

## 2021-08-19 NOTE — Progress Notes (Signed)
ANTICOAGULATION CONSULT NOTE - Initial Consult  Pharmacy Consult for Heparin  Indication: DVT/VTE   No Known Allergies  Patient Measurements: Height: _0  (185.4 cm) Weight: 78.1 kg (172 lb 2.9 oz) IBW/kg (Calculated) : 79.9  Heparin Dosing Weight: 78 kg  Vital Signs: Temp: 98.2 F (36.8 C) (09/14 1139) Temp Source: Axillary (09/14 1139) BP: 117/81 (09/14 1139) Pulse Rate: 92 (09/14 1139)  Labs: Recent Labs    08/18/21 0426 08/18/21 1758 08/19/21 0436 08/19/21 1245  HGB 10.5*  --  11.2*  --   HCT 30.6*  --  32.6*  --   PLT 238  --  249  --   HEPARINUNFRC  --  0.19* 0.26* 0.39  CREATININE 0.77  --  0.76  --     Estimated Creatinine Clearance: 107.1 mL/min (by C-G formula based on SCr of 0.76 mg/dL).   Medical History: Past Medical History:  Diagnosis Date   Arthritis    Asthma    Bilateral lower extremity edema 05/28/2021   propping feet up   Cough 05/28/2021   with clear sputum   Dyspnea    Dyspnea 05/28/2021   with exertion   EtOH dependence (HCC)    drinks 2-3 of 40ounes a day   Fibula fracture    Dr Mardelle Matte 02-2011   Finger osteomyelitis, right ([place]) 05/26/2021   right index finger   Hernia    surgery 01-17-13   Hypertension    not taking bp meds since may 2022   Tuberculosis    couple of yrs ago took tx at [facility] per pt on 05-28-2021    Medications:  Scheduled:   carvedilol  6.25 mg Per Tube BID WC   chlorhexidine gluconate (MEDLINE KIT)  15 mL Mouth Rinse BID   Chlorhexidine Gluconate Cloth  6 each Topical Daily   [START ON 08/20/2021] COVID-19 mRNA vaccine (Moderna)  0.5 mL Intramuscular ONCE-1600   feeding supplement (PROSource TF)  45 mL Per Tube BID   folic acid  1 mg Per Tube Daily   insulin aspart  0-6 Units Subcutaneous TID WC   levETIRAcetam  1,000 mg Per Tube BID   linezolid  600 mg Oral Q12H   multivitamin with minerals  1 tablet Per Tube Daily   [START ON 08/25/2021] thiamine  100 mg Oral Daily    Infusions:   sodium chloride Stopped (08/17/21 1750)   sodium chloride Stopped (08/18/21 2240)   sodium chloride 75 mL/hr at 08/19/21 1144   feeding supplement (OSMOLITE 1.5 CAL) 1,000 mL (08/19/21 0803)   heparin 1,700 Units/hr (08/19/21 0649)   thiamine injection 500 mg (08/19/21 1407)   Followed by   Derrill Memo ON 08/20/2021] thiamine injection      Assessment: 60 Y.O male presented with focal recurrent seizure activity now with concerns for upper extremity VTE. Pharmacy consulted to manage heparin.  Hgb trending down from 14.3 >> 11.2. Platelets remain WNL. Per chart review, no s/sx bleeding. Heparin increased to 1700 units/hr due to subtherapeutic heparin level. Heparin level now therapeutic @ 0.39. No changes needed.   Goal of Therapy:  Heparin level 0.3-0.7 units/ml Monitor platelets by anticoagulation protocol: Yes   Plan:  - Continue heparin infusion at 1700 units/hr - Check heparin level in 8 hours and daily while on heparin - Continue to monitor H&H and platelets   Thank you for allowing pharmacy to be a part of this patient's care.  Ardyth Harps, PharmD Clinical Pharmacist

## 2021-08-19 NOTE — Assessment & Plan Note (Signed)
Transition to penicillin -> planning for linezolid x2 weeks, needs repeat CBC in 7 days Blood cx from 9/5 with strep anginosus Repeat blood cx from 9/7 NGTD CT chest/abd/pelvis with persistent cavitary changes in RUL, trace bilateral effusion, subcentimeter reactiv R paratracheal LN's.  No evidence of abscess or acute findings on CT max/face. ID consulted - recommending linezolid, repeat labs, repeat imaging in 6-8 weeks Temp has been elevated (100 9/12) - last true temp appears to have been 9/11 at 8 am

## 2021-08-19 NOTE — Assessment & Plan Note (Signed)
Due to alcohol withdrawal and requiring mechanical ventilation in order to protect airway.

## 2021-08-19 NOTE — Progress Notes (Signed)
Inpatient Diabetes Program Recommendations  AACE/ADA: New Consensus Statement on Inpatient Glycemic Control  Target Ranges:  Prepandial:   less than 140 mg/dL      Peak postprandial:   less than 180 mg/dL (1-2 hours)      Critically ill patients:  140 - 180 mg/dL   Results for REIN, POPOV (MRN [id number]) as of 08/19/2021 12:13  Ref. Range 08/19/2021 03:42 08/19/2021 07:29 08/19/2021 07:31 08/19/2021 07:32 08/19/2021 11:35  Glucose-Capillary Latest Ref Range: 70 - 99 mg/dL 268 (H) 63 (L) 58 (L) 341 (H) 75   Results for SHELLEY, POOLEY (MRN [id number]) as of 08/19/2021 12:13  Ref. Range 08/18/2021 03:35 08/18/2021 07:41 08/18/2021 07:50 08/18/2021 11:50 08/18/2021 11:51 08/18/2021 15:37 08/18/2021 20:13 08/18/2021 23:21  Glucose-Capillary Latest Ref Range: 70 - 99 mg/dL 79 48 (L) 95 55 (L) 921 (H) 83 122 (H) 127 (H)   Review of Glycemic Control  Current orders for Inpatient glycemic control: Novolog 0-9 units Q4H; Osmolite @ 55 ml/hr  Inpatient Diabetes Program Recommendations:    Insulin: Please decrease Novolog correction to 0-6 units Q4H.  Thanks, Orlando Penner, RN, MSN, CDE Diabetes Coordinator Inpatient Diabetes Program [phone] (Team Pager from 8am to 5pm)

## 2021-08-19 NOTE — Assessment & Plan Note (Signed)
Prominence of bilateral nodular airspace disease - concerning for chronic infection such as mycobacterium or fungus Hx pulm tb s/p tx 2016 ID recommending monitoring yearly b symptoms/cough - consider repeat CT chest in 6-8 weeks

## 2021-08-19 NOTE — Progress Notes (Signed)
PROGRESS NOTE  Frederick [name] DOB: [birth year] DOA: 08/10/2021 PCP: [name]  Brief History   60 yo M with hx of tobacco abuse, etoh abuse, hx TB s/p treatment (in 2001 and 2016)  and multiple other medical problems who presented with 1 week of focal recurrent seizure activity.  EMS was called by wife 2/2 concern for stroke.  On EMS arrival he was actively seizing with generalized tonic clonic seizure.  He was brought to the ED and required intubation for airway protection.    He was admitted to the ICU.  Neurology was consulted for status epilepticus in the setting of etoh withdrawal.  He's being continued on keppra until outpatient follow up.  MRI was limited by motion, but without notable abnormalities.   Hospitalization was complicated by concern for aspiration pneumonia as well as strep anginosis bacteremia.  A TEE was negative for vegetations.   [place]er[place] echo at admission showed reduced EF of 25-30%, which was improved to 50-55% on the TEE.  Cardiology has followed, now signed off.    He's currently receiving tube feeds by cortrak.  He continues to be encephalopathic.  ID has been consulted for his bacteremia.    TRH following as of 9/12.     Consultants  PCCM Cardiology Infectious disease Neurology  Procedures  Mechanical ventilation  Antibiotics   Anti-infectives (From admission, onward)    Start     Dose/Rate Route Frequency Ordered Stop   08/18/21 1230  linezolid (ZYVOX) tablet 600 mg        600 mg Oral Every 12 hours 08/18/21 1138 08/26/21 2359   08/18/21 1215  linezolid (ZYVOX) 100 MG/5ML suspension 600 mg  Status:  Discontinued        600 mg Per Tube Every 12 hours 08/18/21 1128 08/18/21 1138   08/17/21 1215  penicillin G potassium 4 Million Units in dextrose 5 % 250 mL IVPB  Status:  Discontinued        4 Million Units 250 mL/hr over 60 Minutes Intravenous Every 4 hours 08/17/21 1128 08/18/21 1128   08/13/21 1200  cefTRIAXone (ROCEPHIN) 2 g in  sodium chloride 0.9 % 100 mL IVPB  Status:  Discontinued        2 g 200 mL/hr over 30 Minutes Intravenous Every 24 hours 08/13/21 1107 08/17/21 1128   08/12/21 1800  Ampicillin-Sulbactam (UNASYN) 3 g in sodium chloride 0.9 % 100 mL IVPB  Status:  Discontinued        3 g 200 mL/hr over 30 Minutes Intravenous Every 6 hours 08/12/21 1324 08/13/21 1107   08/11/21 1000  Ampicillin-Sulbactam (UNASYN) 3 g in sodium chloride 0.9 % 100 mL IVPB  Status:  Discontinued        3 g 200 mL/hr over 30 Minutes Intravenous Every 8 hours 08/11/21 0902 08/12/21 1324       Subjective  The patient appears anxious and slightly tremulous. No new complaints.  Objective   Vitals:  Vitals:   08/19/21 1139 08/19/21 1430  BP: 117/81 (!) 138/113  Pulse: 92 (!) 121  Resp: 16   Temp: 98.2 F (36.8 C)   SpO2:      Exam:  Constitutional:  The patient is awake and alert, but anxious. Respiratory:  CTA bilaterally, no w/r/r.  Respiratory effort normal. No retractions or accessory muscle use Cardiovascular:  RRR, no m/r/g No LE extremity edema   Normal pedal pulses Abdomen:  Abdomen appears normal; no tenderness or masses No hernias No HSM  Musculoskeletal:  Digits/nails BUE: no clubbing, cyanosis, petechiae, infection exam of joints, bones, muscles of at least one of following: head/neck, RUE, LUE, RLE, LLE   No tenderness, masses Cachexia Skin:  No rashes, lesions, ulcers palpation of skin: no induration or nodules Neurologic:  CN 2-12 intact Sensation all 4 extremities intact Psychiatric:  Mental status - AA&Ox3. Mood, affect  - anxious   I have personally reviewed the following:   Today's Data  Vitals  Lab Data  CBC, BMP  Imaging  CT chest abdomen and pelvis CT maxillofacial  Cardiology Data  EKG Echocardiogram  Scheduled Meds:  carvedilol  6.25 mg Per Tube BID WC   chlorhexidine gluconate (MEDLINE KIT)  15 mL Mouth Rinse BID   Chlorhexidine Gluconate Cloth  6 each  Topical Daily   [START ON 08/20/2021] COVID-19 mRNA vaccine (Moderna)  0.5 mL Intramuscular ONCE-1600   feeding supplement (PROSource TF)  45 mL Per Tube BID   folic acid  1 mg Per Tube Daily   insulin aspart  0-6 Units Subcutaneous TID WC   levETIRAcetam  1,000 mg Per Tube BID   linezolid  600 mg Oral Q12H   multivitamin with minerals  1 tablet Per Tube Daily   [START ON 08/25/2021] thiamine  100 mg Oral Daily   Continuous Infusions:  sodium chloride Stopped (08/17/21 1750)   sodium chloride Stopped (08/18/21 2240)   sodium chloride 75 mL/hr at 08/19/21 1144   feeding supplement (OSMOLITE 1.5 CAL) 1,000 mL (08/19/21 0803)   heparin 1,700 Units/hr (08/19/21 0649)   thiamine injection 500 mg (08/19/21 1407)   Followed by   Derrill Memo ON 08/20/2021] thiamine injection      Active Problems:   Cavitary lesion of lung   Seizures (HCC)   Status epilepticus (HCC)   Malnutrition of moderate degree   Bacteremia due to group B Streptococcus   Acute systolic CHF (congestive heart failure) (HCC)   Alcohol withdrawal seizure with complication, with unspecified complication ([place])   Dysphagia   LOS: 9 days   A & P  Acute systolic CHF (congestive heart failure) (HCC) EF 50-55% by TEE, although 25-30% by TTE. Patient to undergo cardiac MRI on 9/15 and LHC in the future. Cardiac evaluation is to take place prior to bronchoscopy. I appreciate cardiology's assistance. Continue monitoring on telemetry.  Alcohol withdrawal seizure with complication, with unspecified complication ([place]) Pt required mechanical ventilation upon admission in order to protect his airway. He was placed on CIWA protocol and that has since been discontinued. However, per nursing, the patient has become very agitated and restless this afternoon. Will restart as needed ativan.  Bacteremia due to group B Streptococcus Transition to penicillin -> planning for linezolid x2 weeks, needs repeat CBC in 7 days Blood cx from 9/5 with  strep anginosus Repeat blood cx from 9/7 NGTD CT chest/abd/pelvis with persistent cavitary changes in RUL, trace bilateral effusion, subcentimeter reactiv R paratracheal LN's.  No evidence of abscess or acute findings on CT max/face. ID consulted - recommending linezolid, repeat labs, repeat imaging in 6-8 weeks Temp has been elevated (100 9/12) - last true temp appears to have been 9/11 at 8 am  Cavitary lesion of lung Prominence of bilateral nodular airspace disease - concerning for chronic infection such as mycobacterium or fungus Hx pulm tb s/p tx 2016 ID recommending monitoring yearly b symptoms/cough - consider repeat CT chest in 6-8 weeks  Dysphagia Dysphagia  Severe Aspiration Risk  Moderate protein calorie malnutrition Cortrak in place with  tube feeds Follow for improvement  Malnutrition of moderate degree Nutrition consulted. Moderate Malnutrition related to chronic illness (ETOH abuse) as evidenced by mild fat depletion, mild muscle depletion, percent weight loss.   Ongoing, being addressed via initiation of enteral nutrition on 08/14/21  Seizures ([place]) Due to alcohol withdrawal.  Status epilepticus ([place]) Due to alcohol withdrawal and requiring mechanical ventilation in order to protect airway.   Inpatient status I have seen and examined this patient myself. I have spent 34 minutes in his evaluation and care.  DVT prophylaxis: Heparin gtt Code Status: Full Family Communication: None Disposition Plan: tbd    Kentavious Michele, DO [facility] Direct contact: see [email or web address]  7PM-7AM contact night coverage as above 08/19/2021, 3:36 PM  LOS: 9 days

## 2021-08-20 DIAGNOSIS — I5021 Acute systolic (congestive) heart failure: Secondary | ICD-10-CM | POA: Diagnosis not present

## 2021-08-20 DIAGNOSIS — F10239 Alcohol dependence with withdrawal, unspecified: Secondary | ICD-10-CM | POA: Diagnosis not present

## 2021-08-20 DIAGNOSIS — R7881 Bacteremia: Secondary | ICD-10-CM | POA: Diagnosis not present

## 2021-08-20 DIAGNOSIS — J984 Other disorders of lung: Secondary | ICD-10-CM | POA: Diagnosis not present

## 2021-08-20 LAB — GLUCOSE, CAPILLARY
Glucose-Capillary: 107 mg/dL — ABNORMAL HIGH (ref 70–99)
Glucose-Capillary: 109 mg/dL — ABNORMAL HIGH (ref 70–99)
Glucose-Capillary: 113 mg/dL — ABNORMAL HIGH (ref 70–99)
Glucose-Capillary: 115 mg/dL — ABNORMAL HIGH (ref 70–99)
Glucose-Capillary: 128 mg/dL — ABNORMAL HIGH (ref 70–99)
Glucose-Capillary: 132 mg/dL — ABNORMAL HIGH (ref 70–99)
Glucose-Capillary: 23 mg/dL — CL (ref 70–99)
Glucose-Capillary: 31 mg/dL — CL (ref 70–99)
Glucose-Capillary: 48 mg/dL — ABNORMAL LOW (ref 70–99)
Glucose-Capillary: 72 mg/dL (ref 70–99)

## 2021-08-20 LAB — CBC
HCT: 31.2 % — ABNORMAL LOW (ref 39.0–52.0)
Hemoglobin: 10.7 g/dL — ABNORMAL LOW (ref 13.0–17.0)
MCH: 31.7 pg (ref 26.0–34.0)
MCHC: 34.3 g/dL (ref 30.0–36.0)
MCV: 92.3 fL (ref 80.0–100.0)
Platelets: 267 10*3/uL (ref 150–400)
RBC: 3.38 MIL/uL — ABNORMAL LOW (ref 4.22–5.81)
RDW: 14.2 % (ref 11.5–15.5)
WBC: 7.2 10*3/uL (ref 4.0–10.5)
nRBC: 0 % (ref 0.0–0.2)

## 2021-08-20 LAB — BASIC METABOLIC PANEL
Anion gap: 11 (ref 5–15)
BUN: 9 mg/dL (ref 8–23)
CO2: 24 mmol/L (ref 22–32)
Calcium: 8.8 mg/dL — ABNORMAL LOW (ref 8.9–10.3)
Chloride: 95 mmol/L — ABNORMAL LOW (ref 98–111)
Creatinine, Ser: 0.7 mg/dL (ref 0.61–1.24)
GFR, Estimated: >60 mL/min (ref >60)
Glucose, Bld: 125 mg/dL — ABNORMAL HIGH (ref 70–99)
Potassium: 4.1 mmol/L (ref 3.5–5.1)
Sodium: 130 mmol/L — ABNORMAL LOW (ref 135–145)

## 2021-08-20 LAB — HEPARIN LEVEL (UNFRACTIONATED)
Heparin Unfractionated: 0.23 IU/mL — ABNORMAL LOW (ref 0.30–0.70)
Heparin Unfractionated: 0.38 IU/mL (ref 0.30–0.70)

## 2021-08-20 MED ORDER — THIAMINE HCL 100 MG PO TABS: 100.0000 mg | ORAL_TABLET | Freq: Every day | ORAL | Status: DC

## 2021-08-20 MED ORDER — ADULT MULTIVITAMIN W/MINERALS CH: 1.0000 | ORAL_TABLET | Freq: Every day | ORAL | Status: DC

## 2021-08-20 MED ORDER — FOLIC ACID 1 MG PO TABS: 1.0000 mg | ORAL_TABLET | Freq: Every day | ORAL | Status: DC

## 2021-08-20 MED ORDER — LORAZEPAM 1 MG PO TABS
1.0000 mg | ORAL_TABLET | ORAL | Status: AC | PRN
Start: 2021-08-20 — End: 2021-08-23
  Administered 2021-08-23: 2 mg via ORAL
  Filled 2021-08-20: qty 2

## 2021-08-20 MED ORDER — LORAZEPAM 2 MG/ML IJ SOLN
0.0000 mg | Freq: Four times a day (QID) | INTRAMUSCULAR | Status: AC
Start: 2021-08-20 — End: 2021-08-22
  Administered 2021-08-20: 1 mg via INTRAVENOUS
  Administered 2021-08-21 – 2021-08-22 (×3): 2 mg via INTRAVENOUS
  Filled 2021-08-20 (×5): qty 1

## 2021-08-20 MED ORDER — LORAZEPAM 2 MG/ML IJ SOLN
0.0000 mg | Freq: Two times a day (BID) | INTRAMUSCULAR | Status: AC
Start: 2021-08-22 — End: 2021-08-24
  Administered 2021-08-23 – 2021-08-24 (×2): 2 mg via INTRAVENOUS
  Filled 2021-08-20 (×2): qty 1

## 2021-08-20 MED ORDER — THIAMINE HCL 100 MG/ML IJ SOLN: 100.0000 mg | Freq: Every day | INTRAMUSCULAR | Status: DC

## 2021-08-20 MED ORDER — LORAZEPAM 2 MG/ML IJ SOLN
1.0000 mg | INTRAMUSCULAR | Status: AC | PRN
  Administered 2021-08-20 – 2021-08-22 (×3): 2 mg via INTRAVENOUS
  Filled 2021-08-20 (×2): qty 1

## 2021-08-20 NOTE — Plan of Care (Signed)

## 2021-08-20 NOTE — Progress Notes (Signed)
Occupational Therapy Treatment Patient Details Name: Frederick [name] MRN: [id number] DOB: 10-19-1961 Today's Date: 08/20/2021   History of present illness Pt is a 60 year old man admitted on 08/10/21 with ETOH induced seizure. Intubated 08/10/21 to 08/12/21. Head CT negative for acute abnormality. Pt on CIWA precautions. 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 Cortrak placed 08/14/21. Left UE DVT found on 9/13, pt placed on heparin IV 12:30pm 9/13. 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   OT comments  Pt making progress with OT goals this session. He was able to complete bed mobility with mod A and maintain upright posture sitting EOB for 10+ mins. While sitting EOB pt was able to complete exercises and basic grooming. He was able to maintain his sitting balance while reaching outside of his base of support and complete over 75% of simple commands. Recommendation for SNF continues to be most appropriate for pt progression. OT will continue to follow acutely.    Recommendations for follow up therapy are one component of a multi-disciplinary discharge planning process, led by the attending physician.  Recommendations may be updated based on patient status, additional functional criteria and insurance authorization.    Follow Up Recommendations  SNF;Supervision/Assistance - 24 hour    Equipment Recommendations  Other (comment) (TBD next venue)    Recommendations for Other Services      Precautions / Restrictions Precautions Precautions: Fall Precaution Comments: cortrak, waxing/waning agitation Restrictions Weight Bearing Restrictions: No       Mobility Bed Mobility Overal bed mobility: Needs Assistance Bed Mobility: Rolling;Sidelying to Sit;Sit to Sidelying Rolling: Min assist;Mod assist;+2 for safety/equipment Sidelying to sit: Mod assist     Sit to sidelying: Mod assist;+2 for  safety/equipment General bed mobility comments: rolling multiple reps for clean-up and pt able to follow instruction with multimodal cues for cross-body reaching to L/R sides and to pull on rails needs assist to get hips fully onto side. Pt does better sitting up today but still needing step by step tactile/verbal cues for log roll sequencing to sit EOB/return to supine    Transfers Overall transfer level: Needs assistance   Transfers: Lateral/Scoot Transfers          Lateral/Scoot Transfers: Min assist General transfer comment: Pt completed 5 lateral scoots, requiring min A as pt begins to fatigue    Balance Overall balance assessment: Needs assistance Sitting-balance support: Feet supported Sitting balance-Leahy Scale: Fair Sitting balance - Comments: min guard for seated balance ~5 minutes with 1-2 UE support Postural control: Posterior lean                                 ADL either performed or assessed with clinical judgement   ADL                                               Vision       Perception     Praxis      Cognition Arousal/Alertness: Awake/alert Behavior During Therapy: Flat affect Overall Cognitive Status: Impaired/Different from baseline Area of Impairment: Attention;Following commands;Safety/judgement;Problem solving;Memory;Orientation;Awareness                 Orientation Level: Disoriented to;Time;Situation Current Attention Level: Sustained;Focused Memory: Decreased short-term  memory;Decreased recall of precautions Following Commands: Follows one step commands consistently;Follows one step commands with increased time (with multimodal cues) Safety/Judgement: Decreased awareness of safety;Decreased awareness of deficits Awareness: Intellectual Problem Solving: Slow processing;Difficulty sequencing;Requires verbal cues;Requires tactile cues General Comments: Pt more calm and cooperative this session and  able to follow simple 1-step cues ~75% of the time, does benefit from quiet environment and minimal distractions. Pt with mild ataxia and some noted RUE tremors seeming to increase with fatigue.        Exercises Exercises: Other exercises;General Lower Extremity;General Upper Extremity General Exercises - Upper Extremity Shoulder Flexion: AROM;Both;10 reps Elbow Flexion: AROM;Strengthening;Both;10 reps Elbow Extension: AROM;Strengthening;Both;10 reps Digit Composite Flexion: AROM;Both;10 reps Composite Extension: AROM;Both;10 reps General Exercises - Lower Extremity Ankle Circles/Pumps: AROM;Both;10 reps Short Arc Quad: AROM;Both;10 reps Hip Flexion/Marching: AROM;Both;10 reps Heel Raises: AROM;Both;10 reps Other Exercises Other Exercises: Lateral scoots Other Exercises: pulling up to long sitting x5   Shoulder Instructions       General Comments VSS on RA    Pertinent Vitals/ Pain       Pain Assessment: Faces Faces Pain Scale: Hurts a little bit Pain Location: occasionally grimacing but denies acute pain Pain Descriptors / Indicators: Grimacing Pain Intervention(s): Monitored during session;Repositioned  Home Living                                          Prior Functioning/Environment              Frequency  Min 2X/week        Progress Toward Goals  OT Goals(current goals can now be found in the care plan section)  Progress towards OT goals: Progressing toward goals  Acute Rehab OT Goals Patient Stated Goal: "I want to get out of here" OT Goal Formulation: Patient unable to participate in goal setting Time For Goal Achievement: 08/29/21 Potential to Achieve Goals: Fair ADL Goals Pt Will Perform Eating: with mod assist;sitting Pt Will Perform Grooming: with mod assist;sitting Pt Will Transfer to Toilet: with +2 assist;with mod assist Additional ADL Goal #1: Pt will demonstrate fair sitting balance at EOB. Additional ADL Goal #2: Pt  will follow one step commands with 50% accuracy. Additional ADL Goal #3: Pt will perform bed mobility with moderate assistance in preparation for ADL.  Plan Discharge plan remains appropriate;Frequency remains appropriate    Co-evaluation                 AM-PAC OT "6 Clicks" Daily Activity     Outcome Measure   Help from another person eating meals?: Total Help from another person taking care of personal grooming?: A Little Help from another person toileting, which includes using toliet, bedpan, or urinal?: A Lot Help from another person bathing (including washing, rinsing, drying)?: A Lot Help from another person to put on and taking off regular upper body clothing?: A Lot Help from another person to put on and taking off regular lower body clothing?: Total 6 Click Score: 11    End of Session    OT Visit Diagnosis: Muscle weakness (generalized) (M62.81);Other symptoms and signs involving cognitive function   Activity Tolerance Patient tolerated treatment well   Patient Left in bed;with call bell/phone within reach;with bed alarm set   Nurse Communication Mobility status        Time: 5102-5852 OT Time Calculation (min): 35 min  Charges: OT General Charges $  OT Visit: 1 Visit OT Treatments $Therapeutic Activity: 8-22 mins $Therapeutic Exercise: 8-22 mins  Judaea Burgoon H., OTR/L Acute Rehabilitation  Eleen Litz Elane Bing Plume 08/20/2021, 6:23 PM

## 2021-08-20 NOTE — Progress Notes (Signed)
ANTICOAGULATION CONSULT NOTE - Follow Up Consult  Pharmacy Consult for Heparin Indication:  VTE  No Known Allergies  Patient Measurements: Height: 6\' 1"  (185.4 cm) Weight: 78.1 kg (172 lb 2.9 oz) IBW/kg (Calculated) : 79.9 Heparin Dosing Weight:  78.1 kg  Vital Signs: Temp: 99 F (37.2 C) (09/15 0352) Temp Source: Oral (09/15 0352) BP: 101/55 (09/15 0352) Pulse Rate: 88 (09/15 0352)  Labs: Recent Labs    08/18/21 0426 08/18/21 1758 08/19/21 0436 08/19/21 1245 08/19/21 2017 08/20/21 0313  HGB 10.5*  --  11.2*  --   --  10.7*  HCT 30.6*  --  32.6*  --   --  31.2*  PLT 238  --  249  --   --  267  HEPARINUNFRC  --    < > 0.26* 0.39 0.35 0.23*  CREATININE 0.77  --  0.76  --   --  0.70   < > = values in this interval not displayed.     Estimated Creatinine Clearance: 107.1 mL/min (by C-G formula based on SCr of 0.7 mg/dL).  Assessment: Pt continues on heparin gtt for DVT. Heparin level down to subtherapeutic (0.23) on gtt at 1700 units/hr. No issues with line or bleeding reported per RN.  Goal of Therapy:  Heparin level 0.3-0.7 units/ml Monitor platelets by anticoagulation protocol: Yes   Plan:  Increase IV heparin to 1850 units/hr F/u 6 hr heparin level  08/22/21, PharmD, BCPS Please see amion for complete clinical pharmacist phone list 08/20/2021,4:12 AM

## 2021-08-20 NOTE — Progress Notes (Addendum)
PROGRESS NOTE  Frederick [name] KAJ:[id number] DOB: Mar 20, 1961 DOA: 08/10/2021 [facility]: [name]  Brief History   60 yo M with hx of tobacco abuse, etoh abuse, hx TB s/p treatment (in 2001 and 2016)  and multiple other medical problems who presented with 1 week of focal recurrent seizure activity.  EMS was called by wife 2/2 concern for stroke.  On EMS arrival he was actively seizing with generalized tonic clonic seizure.  He was brought to the ED and required intubation for airway protection.    He was admitted to the ICU.  Neurology was consulted for status epilepticus in the setting of etoh withdrawal.  He's being continued on keppra until outpatient follow up.  MRI was limited by motion, but without notable abnormalities.   Hospitalization was complicated by concern for aspiration pneumonia as well as strep anginosis bacteremia.  A TEE was negative for vegetations.   An echo at admission showed reduced EF of 25-30%, which was improved to 50-55% on the TEE.  Cardiology has followed, now signed off.    He's currently receiving tube feeds by cortrak.  He continues to be encephalopathic.  ID has been consulted for his bacteremia. They have changed antibiotic coverage from penicilin G to linezolid for a total duration of two weeks of antibiotic therapy. Last day of antibiotics will be 08/26/2021. The patient's CBC will need to be followed due to risk of thrombocytopenia/neutropenia. The patient will also require and outpatient repeat CT chest in 8 weeks to continue to monitor for B symptoms.   TRH following as of 9/12.    Consultants  PCCM Cardiology Infectious disease Neurology  Procedures  Mechanical ventilation  Antibiotics   Anti-infectives (From admission, onward)    Start     Dose/Rate Route Frequency Ordered Stop   08/18/21 1230  linezolid (ZYVOX) tablet 600 mg        600 mg Oral Every 12 hours 08/18/21 1138 08/26/21 2359   08/18/21 1215  linezolid (ZYVOX) 100 MG/5ML  suspension 600 mg  Status:  Discontinued        600 mg Per Tube Every 12 hours 08/18/21 1128 08/18/21 1138   08/17/21 1215  penicillin G potassium 4 Million Units in dextrose 5 % 250 mL IVPB  Status:  Discontinued        4 Million Units 250 mL/hr over 60 Minutes Intravenous Every 4 hours 08/17/21 1128 08/18/21 1128   08/13/21 1200  cefTRIAXone (ROCEPHIN) 2 g in sodium chloride 0.9 % 100 mL IVPB  Status:  Discontinued        2 g 200 mL/hr over 30 Minutes Intravenous Every 24 hours 08/13/21 1107 08/17/21 1128   08/12/21 1800  Ampicillin-Sulbactam (UNASYN) 3 g in sodium chloride 0.9 % 100 mL IVPB  Status:  Discontinued        3 g 200 mL/hr over 30 Minutes Intravenous Every 6 hours 08/12/21 1324 08/13/21 1107   08/11/21 1000  Ampicillin-Sulbactam (UNASYN) 3 g in sodium chloride 0.9 % 100 mL IVPB  Status:  Discontinued        3 g 200 mL/hr over 30 Minutes Intravenous Every 8 hours 08/11/21 0902 08/12/21 1324       Subjective  The patient ,pre ca;m today. He remains confused. No new complaints.  Objective   Vitals:  Vitals:   08/20/21 0800 08/20/21 1106  BP: 123/76 128/71  Pulse: 91 93  Resp: 18 14  Temp: 98.2 F (36.8 C) 98.2 F (36.8 C)  SpO2:  100%    Exam:  Constitutional:  The patient is awake and alert, but confused. No acute distress. Respiratory:  CTA bilaterally, no w/r/r.  Respiratory effort normal. No retractions or accessory muscle use Cardiovascular:  RRR, no m/r/g No LE extremity edema   Normal pedal pulses Abdomen:  Abdomen appears normal; no tenderness or masses No hernias No HSM Musculoskeletal:  Digits/nails BUE: no clubbing, cyanosis, petechiae, infection exam of joints, bones, muscles of at least one of following: head/neck, RUE, LUE, RLE, LLE   No tenderness, masses Cachexia Skin:  No rashes, lesions, ulcers palpation of skin: no induration or nodules Neurologic:  CN 2-12 intact Sensation all 4 extremities intact Psychiatric:  Mental status  - AA&Ox1.  I have personally reviewed the following:   Today's Data  Vitals  Lab Data  CBC, BMP, glucose  Imaging  CT chest abdomen and pelvis CT maxillofacial  Cardiology Data  EKG Echocardiogram  Scheduled Meds:  carvedilol  6.25 mg Per Tube BID WC   chlorhexidine gluconate (MEDLINE KIT)  15 mL Mouth Rinse BID   Chlorhexidine Gluconate Cloth  6 each Topical Daily   COVID-19 mRNA vaccine (Moderna)  0.5 mL Intramuscular ONCE-1600   feeding supplement (PROSource TF)  45 mL Per Tube BID   folic acid  1 mg Per Tube Daily   insulin aspart  0-6 Units Subcutaneous TID WC   levETIRAcetam  1,000 mg Per Tube BID   linezolid  600 mg Oral Q12H   multivitamin with minerals  1 tablet Per Tube Daily   [START ON 08/25/2021] thiamine  100 mg Oral Daily   Continuous Infusions:  sodium chloride Stopped (08/17/21 1750)   sodium chloride Stopped (08/18/21 2240)   sodium chloride 75 mL/hr at 08/19/21 1144   feeding supplement (JEVITY 1.5 CAL/FIBER) 1,000 mL (08/19/21 1640)   heparin 1,850 Units/hr (08/20/21 1338)   thiamine injection 250 mg (08/20/21 1152)    Active Problems:   Cavitary lesion of lung   Seizures (HCC)   Status epilepticus (HCC)   Malnutrition of moderate degree   Bacteremia due to group B Streptococcus   Acute systolic CHF (congestive heart failure) (HCC)   Alcohol withdrawal seizure with complication, with unspecified complication ([place])   Dysphagia   LOS: 10 days   A & P  Acute systolic CHF (congestive heart failure) (HCC) EF 50-55% by TEE, although 25-30% by TTE.  I appreciate cardiology's assistance. Continue monitoring on telemetry. Due to normal EF no ACE/ARB is necessary.  Alcohol withdrawal seizure with complication, with unspecified complication ([place]) Pt required mechanical ventilation upon admission in order to protect his airway. He was placed on CIWA protocol and that has since been discontinued. However, per nursing, the patient has become very agitated  and restless this afternoon. Will restart as needed ativan.  Bacteremia due to group B Streptococcus Transition to penicillin -> planning for linezolid x2 weeks, needs repeat CBC in 7 days Blood cx from 9/5 with strep anginosus Repeat blood cx from 9/7 NGTD CT chest/abd/pelvis with persistent cavitary changes in RUL, trace bilateral effusion, subcentimeter reactiv R paratracheal LN's.  No evidence of abscess or acute findings on CT max/face. ID consulted - recommending linezolid, repeat labs, repeat imaging in 6-8 weeks Temp has been elevated (100 9/12) - last true temp appears to have been 9/11 at 8 am There is no evidence of endocarditis on TEE.  Cavitary lesion of lung Prominence of bilateral nodular airspace disease - concerning for chronic infection such as mycobacterium or  fungus Hx pulm tb s/p tx 2016 ID recommending monitoring yearly b symptoms/cough - consider repeat CT chest in 6-8 weeks.  Dysphagia Dysphagia  Severe Aspiration Risk  Moderate protein calorie malnutrition Cortrak in place with tube feeds. The patient is receiving Jevity 1.5 @ 55 cc/hr. (1320 cc a day ) With ProSource TF 45 bid. Follow for improvement  Malnutrition of moderate degree Nutrition consulted. Moderate Malnutrition related to chronic illness (ETOH abuse) as evidenced by mild fat depletion, mild muscle depletion, percent weight loss.   Ongoing, being addressed via initiation of enteral nutrition on 08/14/21  Seizures ([place]) Due to alcohol withdrawal.  Status epilepticus ([place]) Due to alcohol withdrawal and requiring mechanical ventilation in order to protect airway.   Inpatient status I have seen and examined this patient myself. I have spent 32 minutes in his evaluation and care.  DVT prophylaxis: Heparin gtt Code Status: Full Family Communication: None Disposition Plan: tbd    Nini Cavan, DO [facility] Direct contact: see [email or web address]  7PM-7AM contact night coverage as  above 08/20/2021, 1:55 PM  LOS: 9 days

## 2021-08-20 NOTE — Progress Notes (Addendum)
ANTICOAGULATION CONSULT NOTE - Follow Up Consult  Pharmacy Consult for Heparin Indication:  VTE  No Known Allergies  Patient Measurements: Height: 6\' 1"  (185.4 cm) Weight: 78.1 kg (172 lb 2.9 oz) IBW/kg (Calculated) : 79.9 Heparin Dosing Weight:  78.1 kg  Vital Signs: Temp: 98.2 F (36.8 C) (09/15 1106) Temp Source: Oral (09/15 1106) BP: 128/71 (09/15 1106) Pulse Rate: 93 (09/15 1106)  Labs: Recent Labs    08/18/21 0426 08/18/21 1758 08/19/21 0436 08/19/21 1245 08/19/21 2017 08/20/21 0313 08/20/21 1119  HGB 10.5*  --  11.2*  --   --  10.7*  --   HCT 30.6*  --  32.6*  --   --  31.2*  --   PLT 238  --  249  --   --  267  --   HEPARINUNFRC  --    < > 0.26*   < > 0.35 0.23* 0.38  CREATININE 0.77  --  0.76  --   --  0.70  --    < > = values in this interval not displayed.     Estimated Creatinine Clearance: 107.1 mL/min (by C-G formula based on SCr of 0.7 mg/dL).  Assessment: Pt continues on heparin gtt for DVT. Heparin level down to subtherapeutic (0.23) on gtt at 1700 units/hr early this morning, thus heparin drip increased to 1850 units/hr.  Next heparin level is (0.38) therapeutic on new rate 1850 units/hr  No bleeding reported. CBC is stable  Goal of Therapy:  Heparin level 0.3-0.7 units/ml Monitor platelets by anticoagulation protocol: Yes   Plan:  Continue IV heparin 1850 units/hr Daily heparin level and CBC  08/22/21, RPh Clinical Pharmacist [phone] Please see amion for complete clinical pharmacist phone list 08/20/2021,1:51 PM

## 2021-08-20 NOTE — Progress Notes (Signed)
Physical Therapy Treatment Patient Details Name: Frederick [name] MRN: [id number] DOB: 26-Sep-1961 Today's Date: 08/20/2021   History of Present Illness Pt is a 60 year old man admitted on 08/10/21 with ETOH induced seizure. Intubated 08/10/21 to 08/12/21. Head CT negative for acute abnormality. Pt on CIWA precautions. 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 Cortrak placed 08/14/21. Left UE DVT found on 9/13, pt placed on heparin IV 12:30pm 9/13. 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    PT Comments    Pt received in supine, agreeable to therapy session and more calm/cooperative this date; pt significant other Angelique Blonder present as well. Pt continues to require simple 1-step commands for sequencing functional mobility tasks but responding to ~75% of commands appropriately today with increased time and participatory in bed mobility, supine/seated UE/LE therapeutic exercises and transfer training. Pt performed sit<>stand with modA +2 assist and bed mobility with up to +2 modA. Pt continues to benefit from PT services to progress toward functional mobility goals. Plan to assess stand pivot or seated scoot transfer to wheelchair next session.  Recommendations for follow up therapy are one component of a multi-disciplinary discharge planning process, led by the attending physician.  Recommendations may be updated based on patient status, additional functional criteria and insurance authorization.  Follow Up Recommendations  SNF;Supervision/Assistance - 24 hour     Equipment Recommendations  None recommended by PT    Recommendations for Other Services       Precautions / Restrictions Precautions Precautions: Fall Precaution Comments: cortrak, waxing/waning agitation Restrictions Weight Bearing Restrictions: No     Mobility  Bed Mobility Overal bed mobility: Needs Assistance Bed Mobility:  Rolling;Sidelying to Sit;Sit to Sidelying Rolling: Min assist;Mod assist;+2 for safety/equipment Sidelying to sit: Mod assist;+2 for safety/equipment     Sit to sidelying: Mod assist;+2 for safety/equipment General bed mobility comments: rolling multiple reps for clean-up and pt able to follow instruction with multimodal cues for cross-body reaching to L/R sides and to pull on rails needs assist to get hips fully onto side. Pt does better sitting up today but still needing step by step tactile/verbal cues for log roll sequencing to sit EOB/return to supine    Transfers Overall transfer level: Needs assistance Equipment used: Rolling walker (2 wheeled) Transfers: Sit to/from Stand Sit to Stand: Mod assist;+2 physical assistance;+2 safety/equipment         General transfer comment: Pt stood for ~1 minute at EOB with RW support and RN/PTA assisting for stability, mild posterior lean throughout and pt able to follow instruction for 2 sidesteps toward [facility]; pt fatigued at this point and impulsive to sit.  Ambulation/Gait       General Gait Details: ambulates minimally at baseline, uses w/c for primary mobility.      Balance Overall balance assessment: Needs assistance Sitting-balance support: Feet supported Sitting balance-Leahy Scale: Fair Sitting balance - Comments: min guard for seated balance ~5 minutes with 1-2 UE support Postural control: Posterior lean (when standing) Standing balance support: Bilateral upper extremity supported Standing balance-Leahy Scale: Poor Standing balance comment: +2 min to modA for static standing at RW and +2 modA for sidesteps x2 toward [facility]              Cognition Arousal/Alertness: Awake/alert Behavior During Therapy: Flat affect Overall Cognitive Status: Impaired/Different from baseline Area of Impairment: Attention;Following commands;Safety/judgement;Problem solving;Memory;Orientation;Awareness  Orientation Level:  Disoriented to;Time;Situation Current Attention Level: Sustained;Focused Memory: Decreased short-term memory;Decreased recall of precautions Following Commands: Follows one step commands consistently;Follows one step commands with increased time (with multimodal cues) Safety/Judgement: Decreased awareness of safety;Decreased awareness of deficits Awareness: Intellectual Problem Solving: Slow processing;Difficulty sequencing;Requires verbal cues;Requires tactile cues General Comments: Pt more calm and cooperative this session and able to follow simple 1-step cues ~75% of the time, does benefit from quiet environment and minimal distractions. Pt with mild ataxia and some noted RUE tremors seeming to increase with fatigue, but able to participate in rolling and seated/standing without increased agitation. Pt significant other Angelique Blonder present which may be helping his orientation/demeanor.      Exercises Other Exercises Other Exercises: supine BLE AAROM: hip abduction/adduction, ankle pumps, heel slides x10 reps ea Other Exercises: seated BLE AROM: hip flexion, LAQ x10 reps ea Other Exercises: sidelying push/pull with each UE on rail for strengthening (bicep curls/tricep extension) x10 reps ea    General Comments General comments (skin integrity, edema, etc.): VSS on RA, pt cortrak intact but needs reminders at times not to pull on it; IV beeping frequently, RN notified it may not be working (it says occluded after RN hits restart on multiple occasions)      Pertinent Vitals/Pain Pain Assessment: Faces Faces Pain Scale: Hurts a little bit Pain Location: occasionally grimacing but denies acute pain Pain Descriptors / Indicators: Grimacing Pain Intervention(s): Limited activity within patient's tolerance;Monitored during session;Repositioned       Prior Function     Per significant other Angelique Blonder, he performs a lateral scoot to wheelchair at baseline, he hasn't walked in 2 or 3 years and "the  doctor didn't tell us why he stopped walking... it was slow."       PT Goals (current goals can now be found in the care plan section) Acute Rehab PT Goals Patient Stated Goal: none stated PT Goal Formulation: Patient unable to participate in goal setting Time For Goal Achievement: 08/29/21 Potential to Achieve Goals: Fair Progress towards PT goals: Progressing toward goals    Frequency    Min 2X/week      PT Plan Current plan remains appropriate       AM-PAC PT "6 Clicks" Mobility   Outcome Measure  Help needed turning from your back to your side while in a flat bed without using bedrails?: A Lot Help needed moving from lying on your back to sitting on the side of a flat bed without using bedrails?: A Lot Help needed moving to and from a bed to a chair (including a wheelchair)?: A Lot Help needed standing up from a chair using your arms (e.g., wheelchair or bedside chair)?: A Lot Help needed to walk in hospital room?: Total Help needed climbing 3-5 steps with a railing? : Total 6 Click Score: 10    End of Session   Activity Tolerance: Patient tolerated treatment well Patient left: in bed;with call bell/phone within reach;with bed alarm set;with family/visitor present (sig other Angelique Blonder present, bed in chair position) Nurse Communication: Mobility status;Other (comment) (pt IV still beeping after RN checked it, says occluded) PT Visit Diagnosis: Muscle weakness (generalized) (M62.81)     Time: 1025-8527 PT Time Calculation (min) (ACUTE ONLY): 27 min  Charges:  $Therapeutic Exercise: 8-22 mins $Therapeutic Activity: 8-22 mins                     Dustyn Dansereau P., PTA Acute Rehabilitation Services Pager: [phone] Office: [phone]  Avyan Livesay M Tashiya Souders 08/20/2021, 2:14 PM

## 2021-08-21 ENCOUNTER — Other Ambulatory Visit (HOSPITAL_COMMUNITY): Payer: Self-pay

## 2021-08-21 DIAGNOSIS — R7881 Bacteremia: Secondary | ICD-10-CM | POA: Diagnosis not present

## 2021-08-21 DIAGNOSIS — I5021 Acute systolic (congestive) heart failure: Secondary | ICD-10-CM | POA: Diagnosis not present

## 2021-08-21 DIAGNOSIS — J984 Other disorders of lung: Secondary | ICD-10-CM | POA: Diagnosis not present

## 2021-08-21 DIAGNOSIS — F10239 Alcohol dependence with withdrawal, unspecified: Secondary | ICD-10-CM | POA: Diagnosis not present

## 2021-08-21 LAB — GLUCOSE, CAPILLARY
Glucose-Capillary: 117 mg/dL — ABNORMAL HIGH (ref 70–99)
Glucose-Capillary: 103 mg/dL — ABNORMAL HIGH (ref 70–99)
Glucose-Capillary: 113 mg/dL — ABNORMAL HIGH (ref 70–99)
Glucose-Capillary: 122 mg/dL — ABNORMAL HIGH (ref 70–99)
Glucose-Capillary: 119 mg/dL — ABNORMAL HIGH (ref 70–99)

## 2021-08-21 LAB — BASIC METABOLIC PANEL
Anion gap: 10 (ref 5–15)
BUN: 9 mg/dL (ref 8–23)
CO2: 26 mmol/L (ref 22–32)
Calcium: 9.3 mg/dL (ref 8.9–10.3)
Chloride: 94 mmol/L — ABNORMAL LOW (ref 98–111)
Creatinine, Ser: 0.8 mg/dL (ref 0.61–1.24)
GFR, Estimated: >60 mL/min (ref >60)
Glucose, Bld: 106 mg/dL — ABNORMAL HIGH (ref 70–99)
Potassium: 4.5 mmol/L (ref 3.5–5.1)
Sodium: 130 mmol/L — ABNORMAL LOW (ref 135–145)

## 2021-08-21 LAB — CBC
HCT: 33.9 % — ABNORMAL LOW (ref 39.0–52.0)
Hemoglobin: 11.3 g/dL — ABNORMAL LOW (ref 13.0–17.0)
MCH: 31.2 pg (ref 26.0–34.0)
MCHC: 33.3 g/dL (ref 30.0–36.0)
MCV: 93.6 fL (ref 80.0–100.0)
Platelets: 277 10*3/uL (ref 150–400)
RBC: 3.62 MIL/uL — ABNORMAL LOW (ref 4.22–5.81)
RDW: 14.2 % (ref 11.5–15.5)
WBC: 8.7 10*3/uL (ref 4.0–10.5)
nRBC: 0 % (ref 0.0–0.2)

## 2021-08-21 LAB — HEPARIN LEVEL (UNFRACTIONATED): Heparin Unfractionated: 0.39 IU/mL (ref 0.30–0.70)

## 2021-08-21 LAB — MAGNESIUM: Magnesium: 1.6 mg/dL — ABNORMAL LOW (ref 1.7–2.4)

## 2021-08-21 MED ORDER — JEVITY 1.5 CAL/FIBER PO LIQD
1000.0000 mL | ORAL | Status: DC
  Administered 2021-08-21: 1000 mL
  Filled 2021-08-21 (×3): qty 1000

## 2021-08-21 MED ORDER — MAGNESIUM SULFATE 2 GM/50ML IV SOLN
2.0000 g | Freq: Once | INTRAVENOUS | Status: AC
  Administered 2021-08-21: 2 g via INTRAVENOUS
  Filled 2021-08-21: qty 50

## 2021-08-21 MED ORDER — DEXTROSE 50 % IV SOLN
25.0000 g | INTRAVENOUS | Status: AC
  Administered 2021-08-21: 25 g via INTRAVENOUS
  Filled 2021-08-21: qty 50

## 2021-08-21 NOTE — Progress Notes (Signed)
SLP Cancellation Note  Patient Details Name: MATTHAN SLEDGE MRN: [id number] DOB: 1961-04-10   Cancelled treatment:       Reason Eval/Treat Not Completed: Fatigue/lethargy limiting ability to participate. RN reports dose of Ativan given earlier this am. Will f/u.    Avie Echevaria, MA, CCC-SLP Acute Rehabilitation Services Office Number: [phone]  [name] 08/21/2021, 11:41 AM

## 2021-08-21 NOTE — Progress Notes (Addendum)
ANTICOAGULATION CONSULT NOTE - Follow Up Consult  Pharmacy Consult for Heparin Indication:  VTE  No Known Allergies  Patient Measurements: Height: 6\' 1"  (185.4 cm) Weight: 78.1 kg (172 lb 2.9 oz) IBW/kg (Calculated) : 79.9 Heparin Dosing Weight:  78.1 kg  Vital Signs: Temp: 98.1 F (36.7 C) (09/16 0356) Temp Source: Oral (09/16 0356) BP: 129/84 (09/16 0840) Pulse Rate: 89 (09/16 0840)  Labs: Recent Labs    08/19/21 0436 08/19/21 1245 08/20/21 0313 08/20/21 1119 08/21/21 0258  HGB 11.2*  --  10.7*  --  11.3*  HCT 32.6*  --  31.2*  --  33.9*  PLT 249  --  267  --  277  HEPARINUNFRC 0.26*   < > 0.23* 0.38 0.39  CREATININE 0.76  --  0.70  --  0.80   < > = values in this interval not displayed.     Estimated Creatinine Clearance: 107.1 mL/min (by C-G formula based on SCr of 0.8 mg/dL).  Assessment: Pt continues on heparin gtt for DVT.  Heparin level is (0.39)remains therapeutic on Heparin infusion 1850 units/hr  No bleeding reported. CBC is stable  Goal of Therapy:  Heparin level 0.3-0.7 units/ml Monitor platelets by anticoagulation protocol: Yes   Plan:  Continue IV heparin 1850 units/hr Daily heparin level and CBC Monitor for bleeding.  Follow up on oral anticoagulation plans.  Benefits copay check done by [facility] pharmacist: his copay is $0 (zero) for both Eliquis and Xarelto.   [facility], RPh Clinical Pharmacist [phone] Please see amion for complete clinical pharmacist phone list 08/21/2021,11:11 AM

## 2021-08-21 NOTE — Progress Notes (Signed)
PROGRESS NOTE  Frederick [name] WVP:[id number] DOB: 20-Nov-1961 DOA: 08/10/2021 PCP: [name]  Brief History   61 yo M with hx of tobacco abuse, etoh abuse, hx TB s/p treatment (in 2001 and 2016)  and multiple other medical problems who presented with 1 week of focal recurrent seizure activity.  EMS was called by wife 2/2 concern for stroke.  On EMS arrival he was actively seizing with generalized tonic clonic seizure.  He was brought to the ED and required intubation for airway protection.    He was admitted to the ICU.  Neurology was consulted for status epilepticus in the setting of etoh withdrawal.  He's being continued on keppra until outpatient follow up.  MRI was limited by motion, but without notable abnormalities.   Hospitalization was complicated by concern for aspiration pneumonia as well as strep anginosis bacteremia.  A TEE was negative for vegetations.   An echo at admission showed reduced EF of 25-30%, which was improved to 50-55% on the TEE.  Cardiology has followed, now signed off.    He's currently receiving tube feeds by cortrak.  He continues to be encephalopathic.  ID has been consulted for his bacteremia. They have changed antibiotic coverage from penicilin G to linezolid for a total duration of two weeks of antibiotic therapy. Last day of antibiotics will be 08/26/2021. The patient's CBC will need to be followed due to risk of thrombocytopenia/neutropenia. The patient will also require and outpatient repeat CT chest in 8 weeks to continue to monitor for B symptoms.   TRH following as of 9/12.   The patient had hypoglycemia with a glucose of 11 this morning. Tube feed rate has been increased to 65 ml/hr.   Consultants  PCCM Cardiology Infectious disease Neurology  Procedures  Mechanical ventilation  Antibiotics   Anti-infectives (From admission, onward)    Start     Dose/Rate Route Frequency Ordered Stop   08/18/21 1230  linezolid (ZYVOX) tablet 600 mg         600 mg Oral Every 12 hours 08/18/21 1138 08/26/21 2359   08/18/21 1215  linezolid (ZYVOX) 100 MG/5ML suspension 600 mg  Status:  Discontinued        600 mg Per Tube Every 12 hours 08/18/21 1128 08/18/21 1138   08/17/21 1215  penicillin G potassium 4 Million Units in dextrose 5 % 250 mL IVPB  Status:  Discontinued        4 Million Units 250 mL/hr over 60 Minutes Intravenous Every 4 hours 08/17/21 1128 08/18/21 1128   08/13/21 1200  cefTRIAXone (ROCEPHIN) 2 g in sodium chloride 0.9 % 100 mL IVPB  Status:  Discontinued        2 g 200 mL/hr over 30 Minutes Intravenous Every 24 hours 08/13/21 1107 08/17/21 1128   08/12/21 1800  Ampicillin-Sulbactam (UNASYN) 3 g in sodium chloride 0.9 % 100 mL IVPB  Status:  Discontinued        3 g 200 mL/hr over 30 Minutes Intravenous Every 6 hours 08/12/21 1324 08/13/21 1107   08/11/21 1000  Ampicillin-Sulbactam (UNASYN) 3 g in sodium chloride 0.9 % 100 mL IVPB  Status:  Discontinued        3 g 200 mL/hr over 30 Minutes Intravenous Every 8 hours 08/11/21 0902 08/12/21 1324      Subjective  The patient is resting comfortably. No new complaints.  Objective   Vitals:  Vitals:   08/21/21 0545 08/21/21 0840  BP: (!) 142/97 129/84  Pulse:  93 89  Resp:    Temp:    SpO2:     Exam:  Constitutional:  The patient is awake and alert, but confused. No acute distress. Respiratory:  CTA bilaterally, no w/r/r.  Respiratory effort normal. No retractions or accessory muscle use Cardiovascular:  RRR, no m/r/g No LE extremity edema   Normal pedal pulses Abdomen:  Abdomen appears normal; no tenderness or masses No hernias No HSM Musculoskeletal:  Digits/nails BUE: no clubbing, cyanosis, petechiae, infection exam of joints, bones, muscles of at least one of following: head/neck, RUE, LUE, RLE, LLE   No tenderness, masses Cachexia Skin:  No rashes, lesions, ulcers palpation of skin: no induration or nodules Neurologic:  CN 2-12 intact Sensation all 4  extremities intact Psychiatric:  Mental status - AA&Ox1.  I have personally reviewed the following:   Today's Data  Vitals  Lab Data  CBC, BMP, glucose  Imaging  CT chest abdomen and pelvis CT maxillofacial  Cardiology Data  EKG Echocardiogram  Scheduled Meds:  carvedilol  6.25 mg Per Tube BID WC   chlorhexidine gluconate (MEDLINE KIT)  15 mL Mouth Rinse BID   Chlorhexidine Gluconate Cloth  6 each Topical Daily   feeding supplement (PROSource TF)  45 mL Per Tube BID   folic acid  1 mg Per Tube Daily   insulin aspart  0-6 Units Subcutaneous TID WC   levETIRAcetam  1,000 mg Per Tube BID   linezolid  600 mg Oral Q12H   LORazepam  0-4 mg Intravenous Q6H   Followed by   Derrill Memo ON 08/22/2021] LORazepam  0-4 mg Intravenous Q12H   multivitamin with minerals  1 tablet Per Tube Daily   [START ON 08/25/2021] thiamine  100 mg Oral Daily   Continuous Infusions:  sodium chloride Stopped (08/17/21 1750)   sodium chloride Stopped (08/18/21 2240)   sodium chloride 75 mL/hr at 08/19/21 1144   feeding supplement (JEVITY 1.5 CAL/FIBER) 1,000 mL (08/20/21 1921)   heparin 1,850 Units/hr (08/20/21 1338)   thiamine injection 250 mg (08/21/21 1112)    Active Problems:   Cavitary lesion of lung   Seizures (HCC)   Status epilepticus (HCC)   Malnutrition of moderate degree   Bacteremia due to group B Streptococcus   Acute systolic CHF (congestive heart failure) (HCC)   Alcohol withdrawal seizure with complication, with unspecified complication ([place])   Dysphagia   LOS: 11 days   A & P  Acute systolic CHF (congestive heart failure) (HCC) EF 50-55% by TEE, although 25-30% by TTE. I appreciate cardiology's assistance. Continue monitoring on telemetry. Due to normal EF no ACE/ARB is necessary.  Alcohol withdrawal seizure with complication, with unspecified complication ([place]) Pt required mechanical ventilation upon admission in order to protect his airway. He was placed on CIWA protocol and  that has since been discontinued. However, per nursing, the patient has become very agitated and restless this afternoon. Will restart as needed ativan.  Bacteremia due to group B Streptococcus Transition from penicillin -> planning for linezolid x2 weeks, needs repeat CBC in 5 days. Last day of antibiotics 08/26/2021. Blood cx from 9/5 with strep anginosus Repeat blood cx from 9/7 NGTD CT chest/abd/pelvis with persistent cavitary changes in RUL, trace bilateral effusion, subcentimeter reactiv R paratracheal LN's.  No evidence of abscess or acute findings on CT max/face. ID consulted - recommending linezolid, repeat labs, repeat imaging in 6-8 weeks Temp has been elevated (100 9/12) - last true temp appears to have been 9/11 at 8 am  There is no evidence of endocarditis on TEE.  Cavitary lesion of lung Prominence of bilateral nodular airspace disease - concerning for chronic infection such as mycobacterium or fungus Hx pulm tb s/p tx 2016 ID recommending monitoring yearly b symptoms/cough - consider repeat CT chest in 6-8 weeks.  Dysphagia Dysphagia  Severe Aspiration Risk  Moderate protein calorie malnutrition Cortrak in place with tube feeds. The patient is receiving Jevity 1.5 @ 65 cc/hr. (1560 cc a day ). This was increased from 55cc/hr after patient had a glucose of 11 this am. With ProSource TF 45 bid. Follow for improvement.  Malnutrition of moderate degree Nutrition consulted. Moderate Malnutrition related to chronic illness (ETOH abuse) as evidenced by mild fat depletion, mild muscle depletion, percent weight loss.   Ongoing, being addressed via initiation of enteral nutrition on 08/14/21  Seizures ([place]) Due to alcohol withdrawal.  Status epilepticus ([place]) Due to alcohol withdrawal and requiring mechanical ventilation in order to protect airway.   Inpatient status I have seen and examined this patient myself. I have spent 34 minutes in his evaluation and care.  DVT  prophylaxis: Heparin gtt Code Status: Full Family Communication: None Disposition Plan: tbd    Basya Casavant, DO [facility] Direct contact: see [email or web address]  7PM-7AM contact night coverage as above 08/21/2021, 5:22 PM  LOS: 9 days

## 2021-08-21 NOTE — Plan of Care (Signed)
Pt alert to self, pt has had signs and symptoms of withdrawls through the night PRN ativan given per CIWA, pt stated he usually drink 40 oz , not specific how often. Pt noted with tremors, anxiety, agitation, restlessness, insomnia. Pt has had multiple episodes of diarrhea. Pt had tube feedings via NG tube. Heparin drip continued per order. Pt confused and attempted to get out of bed multiple times, reoriented. Alarms on.   Problem: Education: Goal: Knowledge of General Education information will improve Description: Including pain rating scale, medication(s)/side effects and non-pharmacologic comfort measures Outcome: Progressing   Problem: Health Behavior/Discharge Planning: Goal: Ability to manage health-related needs will improve Outcome: Progressing   Problem: Clinical Measurements: Goal: Ability to maintain clinical measurements within normal limits will improve Outcome: Progressing Goal: Will remain free from infection Outcome: Progressing Goal: Diagnostic test results will improve Outcome: Progressing Goal: Respiratory complications will improve Outcome: Progressing Goal: Cardiovascular complication will be avoided Outcome: Progressing

## 2021-08-21 NOTE — Progress Notes (Signed)
Speech Language Pathology Treatment: Dysphagia  Patient Details Name: Frederick [name] MRN: [id number] DOB: 06/01/1961 Today's Date: 08/21/2021 Time: 0865-7846 SLP Time Calculation (min) (ACUTE ONLY): 13 min  Assessment / Plan / Recommendation Clinical Impression  Pt very lethargic upon SLP arrival, however verbally responded to clinician's questions and expressed desire for ice chips. Oral care provided prior to POs with pt exhibiting overt coughing during completion. He required progressively increased verbal/tactile cueing to remain alert for trials of ice chips via teaspoon. He orally accepted x1 bolus, promptly orally manipulated with swallow initiation noted via palpation. A delayed cough response raises concerns for airway compromise, especially given reduced mentation. He did not orally accept any further POs and session terminated due to lethargy. Current NPO recommendation to remain. SLP to continue f/u for PO readiness.    HPI HPI: Frederick [name] is a 60 y.o. male with tobacco abuse, ETOH abuse (significantly > 80 oz beer per day, liquor per wife). Admitted with 1 week history of recurrent focal seizure activity. CT atrophy with small vessel chronic ischemic changes of deep cerebral white matter. 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 Required intubated 9/5-9/7. Other PMH: TBI x 2 (2001, 2016), asthma, cough.      SLP Plan  Continue with current plan of care      Recommendations for follow up therapy are one component of a multi-disciplinary discharge planning process, led by the attending physician.  Recommendations may be updated based on patient status, additional functional criteria and insurance authorization.    Recommendations  Diet recommendations: NPO Medication Administration: Via alternative means                Oral Care Recommendations: Oral care QID;Staff/trained caregiver to provide oral care Follow up Recommendations: Other (comment) (TBD) SLP  Visit Diagnosis: Dysphagia, unspecified (R13.10) Plan: Continue with current plan of care       GO               Avie Echevaria, MA, CCC-SLP Acute Rehabilitation Services Office Number: [phone]  [name]  08/21/2021, 1:56 PM

## 2021-08-22 DIAGNOSIS — R1310 Dysphagia, unspecified: Secondary | ICD-10-CM | POA: Diagnosis not present

## 2021-08-22 DIAGNOSIS — F10239 Alcohol dependence with withdrawal, unspecified: Secondary | ICD-10-CM | POA: Diagnosis not present

## 2021-08-22 DIAGNOSIS — R7881 Bacteremia: Secondary | ICD-10-CM | POA: Diagnosis not present

## 2021-08-22 DIAGNOSIS — J984 Other disorders of lung: Secondary | ICD-10-CM | POA: Diagnosis not present

## 2021-08-22 LAB — GLUCOSE, CAPILLARY
Glucose-Capillary: 103 mg/dL — ABNORMAL HIGH (ref 70–99)
Glucose-Capillary: 122 mg/dL — ABNORMAL HIGH (ref 70–99)
Glucose-Capillary: 122 mg/dL — ABNORMAL HIGH (ref 70–99)
Glucose-Capillary: 69 mg/dL — ABNORMAL LOW (ref 70–99)
Glucose-Capillary: 92 mg/dL (ref 70–99)
Glucose-Capillary: 99 mg/dL (ref 70–99)

## 2021-08-22 LAB — CBC
HCT: 33 % — ABNORMAL LOW (ref 39.0–52.0)
Hemoglobin: 10.9 g/dL — ABNORMAL LOW (ref 13.0–17.0)
MCH: 30.5 pg (ref 26.0–34.0)
MCHC: 33 g/dL (ref 30.0–36.0)
MCV: 92.4 fL (ref 80.0–100.0)
Platelets: 315 10*3/uL (ref 150–400)
RBC: 3.57 MIL/uL — ABNORMAL LOW (ref 4.22–5.81)
RDW: 14.1 % (ref 11.5–15.5)
WBC: 7.4 10*3/uL (ref 4.0–10.5)
nRBC: 0 % (ref 0.0–0.2)

## 2021-08-22 LAB — HEPARIN LEVEL (UNFRACTIONATED): Heparin Unfractionated: 0.44 IU/mL (ref 0.30–0.70)

## 2021-08-22 MED ORDER — JEVITY 1.5 CAL/FIBER PO LIQD
1000.0000 mL | ORAL | Status: DC
  Administered 2021-08-22: 1000 mL
  Filled 2021-08-22 (×3): qty 1000

## 2021-08-22 MED ORDER — MORPHINE SULFATE (PF) 2 MG/ML IV SOLN
1.0000 mg | Freq: Once | INTRAVENOUS | Status: AC
  Administered 2021-08-22: 1 mg via INTRAVENOUS
  Filled 2021-08-22: qty 1

## 2021-08-22 MED ORDER — DEXTROSE-NACL 5-0.2 % IV SOLN
INTRAVENOUS | Status: DC
  Filled 2021-08-22 (×2): qty 1000

## 2021-08-22 NOTE — Progress Notes (Addendum)
PROGRESS NOTE  Frederick [name] EQA:[id number] DOB: 1961/04/25 DOA: 08/10/2021 PCP: [name]  HPI/Recap of past 24 hours:   60 year old male with history of tobacco abuse, alcohol abuse, history of TB status posttreatment in 2001 and 2016,, wheelchair-bound for at least 2 years, he presented to the emergency room with 1 week history of focal recurrent seizure activities.  EMS was called and it was noted that when EMS arrived he was asked to be seizing with generalized tonic-clonic seizure.  He was brought to the emergency room and required intubation for airway protection.  He was subsequently admitted to ICU. Neurology was consulted for status epilepticus in the setting of alcohol withdrawal He is currently being continued on Keppra.  His MRI of the brain was unremarkable  His hospitalization was complicated by aspiration pneumonia as well as strep angina gnosis bacteremia a TEE was done and it was negative for vegetation He is currently receiving feeding tube by core track. Subjective August 22, 2021:  Patient seen and examined at bedside Complaining of back pain Is currently being fed with tube feeding with cortrack at 19 mils per hour Nurse is complaining of large diarrhea with his bottom being raw  Assessment/Plan: Active Problems:   Cavitary lesion of lung   Seizures (HCC)   Status epilepticus ([place])   Malnutrition of moderate degree   Bacteremia due to group B Streptococcus   Acute systolic CHF (congestive heart failure) (HCC)   Alcohol withdrawal seizure with complication, with unspecified complication ([place])   Dysphagia  #1 diarrhea this may be secondary to tube feeding will decrease his feeding tube rate from 55 mils per hour to 45 mils per hour Continue to monitor for dehydration Nurse noted some redness on his bottom due to the diarrhea.  We will put a rectal tube  2.  Status epilepticus.  Patient was admitted to ICU and the status of splitters queen was  related to possibly alcohol withdrawal Continue Keppra MRI was not diagnostic  3.  Hypoglycemia.  Patient was hypoglycemic yesterday as low as 11.  We will continue to monitor since he is having diarrhea and I have reduced his feeding rate from 55 mils an hour.  I will add D5 half-normal saline  4.  Cavitary lesion of the lung ID recommending monitoring yearly for symptoms of cough  5.  Dysphagia.  Patient is cortrack feeding  6.  Moderate malnutrition likely secondary to alcohol abuse Continue tube feeding Code Status: Full  Severity of Illness: The appropriate patient status for this patient is INPATIENT. Inpatient status is judged to be reasonable and necessary in order to provide the required intensity of service to ensure the patient's safety. The patient's presenting symptoms, physical exam findings, and initial radiographic and laboratory data in the context of their chronic comorbidities is felt to place them at high risk for further clinical deterioration. Furthermore, it is not anticipated that the patient will be medically stable for discharge from the hospital within 2 midnights of admission. The following factors support the patient status of inpatient.  Tube feeding PEG feeding   * I certify that at the point of admission it is my clinical judgment that the patient will require inpatient hospital care spanning beyond 2 midnights from the point of admission due to high intensity of service, high risk for further deterioration and high frequency of surveillance required.*   Family Communication: None at bedside  Disposition Plan: To be determined Status is: Inpatient   Dispo: The  patient is from: Home              Anticipated d/c is to:               Anticipated d/c date is:               Patient currently not medically stable for discharge  Consultants: PCCM Cardiology Infectious disease Neurology  Procedures: Mechanical ventilation TEE negative for vegetation 2D  echo  Antimicrobials: Linezolid continue till August 26, 2021 Was on penicillin G  DVT prophylaxis: Heparin   Objective: Vitals:   08/21/21 2028 08/21/21 2344 08/22/21 0346 08/22/21 0500  BP: 106/78 121/79    Pulse: 93 86    Resp:   19   Temp: 98 F (36.7 C)  97.7 F (36.5 C)   TempSrc: Axillary  Axillary   SpO2: 100%  100%   Weight:    80 kg  Height:        Intake/Output Summary (Last 24 hours) at 08/22/2021 0931 Last data filed at 08/22/2021 0700 Gross per 24 hour  Intake 4659.96 ml  Output 425 ml  Net 4234.96 ml   Filed Weights   08/17/21 0500 08/19/21 0237 08/22/21 0500  Weight: 78 kg 78.1 kg 80 kg   Body mass index is 23.27 kg/m.  Exam:  General: 60 y.o. year-old male well developed well nourished in no acute distress.  Alert and oriented x3.  Feeding tube in place Cardiovascular: Regular rate and rhythm with no rubs or gallops.  No thyromegaly or JVD noted.   Respiratory: Clear to auscultation with no wheezes or rales. Good inspiratory effort. Abdomen: Soft nontender nondistended with normal bowel sounds x4 quadrants. Musculoskeletal: No lower extremity edema. 2/4 pulses in all 4 extremities. Skin: No ulcerative lesions noted or rashes, Psychiatry: Mood is appropriate for condition and setting Neurology:    Data Reviewed: CBC: Recent Labs  Lab 08/18/21 0426 08/19/21 0436 08/20/21 0313 08/21/21 0258 08/22/21 0202  WBC 6.1 6.7 7.2 8.7 7.4  NEUTROABS 3.8 4.8  --   --   --   HGB 10.5* 11.2* 10.7* 11.3* 10.9*  HCT 30.6* 32.6* 31.2* 33.9* 33.0*  MCV 91.9 93.4 92.3 93.6 92.4  PLT 238 249 267 277 672   Basic Metabolic Panel: Recent Labs  Lab 08/15/21 1655 08/16/21 0541 08/16/21 1703 08/18/21 0426 08/19/21 0436 08/20/21 0313 08/21/21 0258  NA  --  132*  --  127* 128* 130* 130*  K  --  4.2  --  4.1 4.3 4.1 4.5  CL  --  99  --  94* 94* 95* 94*  CO2  --  26  --  _0 GLUCOSE  --  122*  --  133* 107* 125* 106*  BUN  --  14  --  _1 CREATININE  --  0.81  --  0.77 0.76 0.70 0.80  CALCIUM  --  8.6*  --  8.6* 8.9 8.8* 9.3  MG 1.9 1.7 2.0 1.6* 1.6*  --  1.6*  PHOS 3.6 3.6 3.6 3.9 4.6  --   --    GFR: Estimated Creatinine Clearance: 109.6 mL/min (by C-G formula based on SCr of 0.8 mg/dL). Liver Function Tests: Recent Labs  Lab 08/18/21 0426 08/19/21 0436  AST 56* 50*  ALT 35 35  ALKPHOS 63 65  BILITOT 0.4 0.3  PROT 6.2* 6.5  ALBUMIN 1.6* 1.8*   No results for input(s): LIPASE, AMYLASE in the last  168 hours. No results for input(s): AMMONIA in the last 168 hours. Coagulation Profile: No results for input(s): INR, PROTIME in the last 168 hours. Cardiac Enzymes: No results for input(s): CKTOTAL, CKMB, CKMBINDEX, TROPONINI in the last 168 hours. BNP (last 3 results) No results for input(s): PROBNP in the last 8760 hours. HbA1C: No results for input(s): HGBA1C in the last 72 hours. CBG: Recent Labs  Lab 08/21/21 1225 08/21/21 1631 08/21/21 1925 08/22/21 0000 08/22/21 0743  GLUCAP 113* 122* 119* 122* 122*   Lipid Profile: No results for input(s): CHOL, HDL, LDLCALC, TRIG, CHOLHDL, LDLDIRECT in the last 72 hours. Thyroid Function Tests: No results for input(s): TSH, T4TOTAL, FREET4, T3FREE, THYROIDAB in the last 72 hours. Anemia Panel: No results for input(s): VITAMINB12, FOLATE, FERRITIN, TIBC, IRON, RETICCTPCT in the last 72 hours. Urine analysis:    Component Value Date/Time   COLORURINE YELLOW 08/10/2021 1613   APPEARANCEUR HAZY (A) 08/10/2021 1613   LABSPEC 1.012 08/10/2021 1613   PHURINE 5.0 08/10/2021 1613   GLUCOSEU NEGATIVE 08/10/2021 1613   HGBUR MODERATE (A) 08/10/2021 1613   BILIRUBINUR NEGATIVE 08/10/2021 1613   KETONESUR NEGATIVE 08/10/2021 1613   PROTEINUR NEGATIVE 08/10/2021 1613   UROBILINOGEN 1.0 03/02/2015 1136   NITRITE NEGATIVE 08/10/2021 1613   LEUKOCYTESUR NEGATIVE 08/10/2021 1613   Sepsis Labs: _0 (procalcitonin:4,lacticidven:4)  )No results found for  this or any previous visit (from the past 240 hour(s)).    Studies: No results found.  Scheduled Meds:  carvedilol  6.25 mg Per Tube BID WC   chlorhexidine gluconate (MEDLINE KIT)  15 mL Mouth Rinse BID   Chlorhexidine Gluconate Cloth  6 each Topical Daily   feeding supplement (PROSource TF)  45 mL Per Tube BID   folic acid  1 mg Per Tube Daily   insulin aspart  0-6 Units Subcutaneous TID WC   levETIRAcetam  1,000 mg Per Tube BID   linezolid  600 mg Oral Q12H   LORazepam  0-4 mg Intravenous Q6H   Followed by   LORazepam  0-4 mg Intravenous Q12H   multivitamin with minerals  1 tablet Per Tube Daily   [START ON 08/25/2021] thiamine  100 mg Oral Daily    Continuous Infusions:  sodium chloride Stopped (08/17/21 1750)   sodium chloride Stopped (08/18/21 2240)   sodium chloride 75 mL/hr at 08/22/21 0658   feeding supplement (JEVITY 1.5 CAL/FIBER) 1,000 mL (08/21/21 1755)   heparin 1,850 Units/hr (08/21/21 1948)   thiamine injection 250 mg (08/22/21 0918)     LOS: 12 days     Cristal Deer, MD [facility]  To reach me or the doctor on call, go to: [email or web address] Password [facility]  08/22/2021, 9:31 AM

## 2021-08-22 NOTE — Plan of Care (Signed)
Pt is alert oriented x 1-2. Pt became agitated, restless, anxious PRN ativan given per CIWA. Effective. Ptc/o right leg pain, described it as cramping, hurting. On call provider paged and received x 1 order for 1 mg morphine.  Effective pt resting.  Tube feedings continued per order. Heparin drip continued per order.  IV fluids continued per order.     Problem: Education: Goal: Knowledge of General Education information will improve Description: Including pain rating scale, medication(s)/side effects and non-pharmacologic comfort measures Outcome: Progressing   Problem: Health Behavior/Discharge Planning: Goal: Ability to manage health-related needs will improve Outcome: Progressing   Problem: Clinical Measurements: Goal: Ability to maintain clinical measurements within normal limits will improve Outcome: Progressing Goal: Will remain free from infection Outcome: Progressing Goal: Diagnostic test results will improve Outcome: Progressing Goal: Respiratory complications will improve Outcome: Progressing Goal: Cardiovascular complication will be avoided Outcome: Progressing   Problem: Activity: Goal: Risk for activity intolerance will decrease Outcome: Progressing   Problem: Nutrition: Goal: Adequate nutrition will be maintained Outcome: Progressing   Problem: Coping: Goal: Level of anxiety will decrease Outcome: Progressing   Problem: Elimination: Goal: Will not experience complications related to bowel motility Outcome: Progressing Goal: Will not experience complications related to urinary retention Outcome: Progressing   Problem: Pain Managment: Goal: General experience of comfort will improve Outcome: Progressing   Problem: Safety: Goal: Ability to remain free from injury will improve Outcome: Progressing   Problem: Skin Integrity: Goal: Risk for impaired skin integrity will decrease Outcome: Progressing   Problem: Education: Goal: Knowledge of disease or  condition will improve Outcome: Progressing Goal: Understanding of discharge needs will improve Outcome: Progressing   Problem: Health Behavior/Discharge Planning: Goal: Ability to identify changes in lifestyle to reduce recurrence of condition will improve Outcome: Progressing Goal: Identification of resources available to assist in meeting health care needs will improve Outcome: Progressing   Problem: Health Behavior/Discharge Planning: Goal: Identification of resources available to assist in meeting health care needs will improve Outcome: Progressing   Problem: Health Behavior/Discharge Planning: Goal: Identification of resources available to assist in meeting health care needs will improve Outcome: Progressing   Problem: Physical Regulation: Goal: Complications related to the disease process, condition or treatment will be avoided or minimized Outcome: Progressing   Problem: Safety: Goal: Ability to remain free from injury will improve Outcome: Progressing

## 2021-08-22 NOTE — Progress Notes (Signed)
Patient with frequent large diarrhea. MD made aware. Received order to decrease tube feeds to 45 cc/hr. OK to place rectal tube.

## 2021-08-22 NOTE — Progress Notes (Signed)
ANTICOAGULATION CONSULT NOTE - Follow Up Consult  Pharmacy Consult for Heparin Indication:  VTE  No Known Allergies  Patient Measurements: Height: 6\' 1"  (185.4 cm) Weight: 80 kg (176 lb 5.9 oz) IBW/kg (Calculated) : 79.9 Heparin Dosing Weight:  78.1 kg  Vital Signs: Temp: 97.7 F (36.5 C) (09/17 0346) Temp Source: Axillary (09/17 0346) BP: 121/79 (09/16 2344) Pulse Rate: 86 (09/16 2344)  Labs: Recent Labs    08/20/21 0313 08/20/21 1119 08/21/21 0258 08/22/21 0202  HGB 10.7*  --  11.3* 10.9*  HCT 31.2*  --  33.9* 33.0*  PLT 267  --  277 315  HEPARINUNFRC 0.23* 0.38 0.39 0.44  CREATININE 0.70  --  0.80  --      Estimated Creatinine Clearance: 109.6 mL/min (by C-G formula based on SCr of 0.8 mg/dL).  Assessment: Pt continues on heparin gtt for DVT.  Heparin level is (0.44)remains therapeutic on Heparin infusion 1850 units/hr  No bleeding reported. CBC is stable  Goal of Therapy:  Heparin level 0.3-0.7 units/ml Monitor platelets by anticoagulation protocol: Yes   Plan:  Continue IV heparin 1850 units/hr Daily heparin level and CBC Monitor for bleeding.  Follow up for oral anticoagulation plans.  Benefits copay check done by [facility] pharmacist: his copay is $0 (zero) for both Eliquis and Xarelto.   [facility], RPh Clinical Pharmacist [phone] Please see amion for complete clinical pharmacist phone list 08/22/2021,8:42 AM

## 2021-08-23 DIAGNOSIS — J984 Other disorders of lung: Secondary | ICD-10-CM | POA: Diagnosis not present

## 2021-08-23 DIAGNOSIS — F10239 Alcohol dependence with withdrawal, unspecified: Secondary | ICD-10-CM | POA: Diagnosis not present

## 2021-08-23 DIAGNOSIS — I5021 Acute systolic (congestive) heart failure: Secondary | ICD-10-CM | POA: Diagnosis not present

## 2021-08-23 DIAGNOSIS — R7881 Bacteremia: Secondary | ICD-10-CM | POA: Diagnosis not present

## 2021-08-23 LAB — CBC
HCT: 29.4 % — ABNORMAL LOW (ref 39.0–52.0)
Hemoglobin: 10 g/dL — ABNORMAL LOW (ref 13.0–17.0)
MCH: 31.5 pg (ref 26.0–34.0)
MCHC: 34 g/dL (ref 30.0–36.0)
MCV: 92.7 fL (ref 80.0–100.0)
Platelets: 352 10*3/uL (ref 150–400)
RBC: 3.17 MIL/uL — ABNORMAL LOW (ref 4.22–5.81)
RDW: 14.3 % (ref 11.5–15.5)
WBC: 8 10*3/uL (ref 4.0–10.5)
nRBC: 0 % (ref 0.0–0.2)

## 2021-08-23 LAB — GLUCOSE, CAPILLARY
Glucose-Capillary: 103 mg/dL — ABNORMAL HIGH (ref 70–99)
Glucose-Capillary: 106 mg/dL — ABNORMAL HIGH (ref 70–99)
Glucose-Capillary: 106 mg/dL — ABNORMAL HIGH (ref 70–99)
Glucose-Capillary: 108 mg/dL — ABNORMAL HIGH (ref 70–99)
Glucose-Capillary: 111 mg/dL — ABNORMAL HIGH (ref 70–99)
Glucose-Capillary: 122 mg/dL — ABNORMAL HIGH (ref 70–99)

## 2021-08-23 LAB — HEPARIN LEVEL (UNFRACTIONATED): Heparin Unfractionated: 0.51 IU/mL (ref 0.30–0.70)

## 2021-08-23 MED ORDER — DOCUSATE SODIUM 100 MG PO CAPS: 100.0000 mg | ORAL_CAPSULE | Freq: Two times a day (BID) | ORAL | Status: DC | PRN

## 2021-08-23 MED ORDER — LINEZOLID 600 MG PO TABS
600.0000 mg | ORAL_TABLET | Freq: Two times a day (BID) | ORAL | Status: DC
  Administered 2021-08-23 (×2): 600 mg
  Filled 2021-08-23 (×2): qty 1

## 2021-08-23 MED ORDER — CARVEDILOL 6.25 MG PO TABS
6.2500 mg | ORAL_TABLET | Freq: Two times a day (BID) | ORAL | Status: DC
  Administered 2021-08-24 – 2021-08-28 (×9): 6.25 mg via ORAL
  Filled 2021-08-23 (×9): qty 1

## 2021-08-23 MED ORDER — LEVETIRACETAM 500 MG PO TABS
1000.0000 mg | ORAL_TABLET | Freq: Two times a day (BID) | ORAL | Status: DC
  Administered 2021-08-24 – 2021-08-28 (×9): 1000 mg via ORAL
  Filled 2021-08-23 (×8): qty 2
  Filled 2021-08-23: qty 4
  Filled 2021-08-23: qty 2

## 2021-08-23 MED ORDER — PROSOURCE PLUS PO LIQD
30.0000 mL | Freq: Two times a day (BID) | ORAL | Status: DC
  Administered 2021-08-23: 30 mL via ORAL
  Filled 2021-08-23 (×2): qty 30

## 2021-08-23 MED ORDER — ADULT MULTIVITAMIN W/MINERALS CH
1.0000 | ORAL_TABLET | Freq: Every day | ORAL | Status: DC
  Administered 2021-08-24 – 2021-08-28 (×5): 1 via ORAL
  Filled 2021-08-23 (×5): qty 1

## 2021-08-23 MED ORDER — POLYETHYLENE GLYCOL 3350 17 G PO PACK: 17.0000 g | PACK | Freq: Every day | ORAL | Status: DC | PRN

## 2021-08-23 MED ORDER — FOLIC ACID 1 MG PO TABS
1.0000 mg | ORAL_TABLET | Freq: Every day | ORAL | Status: DC
  Administered 2021-08-24 – 2021-08-28 (×5): 1 mg via ORAL
  Filled 2021-08-23 (×5): qty 1

## 2021-08-23 MED ORDER — LINEZOLID 600 MG PO TABS
600.0000 mg | ORAL_TABLET | Freq: Two times a day (BID) | ORAL | Status: AC
  Administered 2021-08-24 – 2021-08-26 (×6): 600 mg via ORAL
  Filled 2021-08-23 (×6): qty 1

## 2021-08-23 NOTE — Progress Notes (Addendum)
PROGRESS NOTE  Frederick [name] EHM:[id number] DOB: [birth year] DOA: 08/10/2021 [name]  HPI/Recap of past 24 hours:   60 year old male with history of tobacco abuse, alcohol abuse, history of TB status posttreatment in 2001 and 2016,, wheelchair-bound for at least 2 years, he presented to the emergency room with 1 week history of focal recurrent seizure activities.  EMS was called and it was noted that when EMS arrived he was asked to be seizing with generalized tonic-clonic seizure.  He was brought to the emergency room and required intubation for airway protection.  He was subsequently admitted to ICU. Neurology was consulted for status epilepticus in the setting of alcohol withdrawal He is currently being continued on Keppra.  His MRI of the brain was unremarkable  His hospitalization was complicated by aspiration pneumonia as well as strep angina gnosis bacteremia a TEE was done and it was negative for vegetation He is currently receiving feeding tube by core track.  Subjective August 22, 2021:  Patient seen and examined at bedside Complaining of back pain Is currently being fed with tube feeding with cortrack at 55 mils per hour Nurse is complaining of large diarrhea with his bottom being raw  August 23, 2021: Patient seen and examined at bedside. Nurse reported that patient was slightly agitated this morning and was given Ativan He was having a lot of diarrhea yesterday and his feeding tube rate was reduced to 45 mils per hour He stated he feels he can eat by mouth.  Also wanted to know when he can be discharged He wanted me to speak with his "wife".  I called his wife who stated that they were separated prior to his admission and that she herself is disabled and cannot take care of him at home also stated that he had been in a wheelchair for about 2 years and has not been able to get up also complained that she herself is disabled and has custody of her  49-year-old baby.  Also that he has an alcohol problem She is suggesting SNF because she cannot take care of him at home.  She initially spoke to [employer] officer, [employer] who told her they would decide on disposition he gets better.  Assessment/Plan: Active Problems:   Cavitary lesion of lung   Seizures (HCC)   Status epilepticus (HCC)   Malnutrition of moderate degree   Bacteremia due to group B Streptococcus   Acute systolic CHF (congestive heart failure) (HCC)   Alcohol withdrawal seizure with complication, with unspecified complication ([place])   Dysphagia  #1 diarrhea this may be secondary to tube feeding will decrease his feeding tube rate from 55 mils per hour to 45 mils per hour Continue to monitor for dehydration Nurse noted some redness on his bottom due to the diarrhea.   Rectal tube was placed Will obtain podiatry consult  2.  Status epilepticus.  Patient was admitted to ICU and the status of splitters queen was related to possibly alcohol withdrawal Continue Keppra MRI was not diagnostic  3.  Hypoglycemia.  Patient was hypoglycemic yesterday as low as 11.  We will continue to monitor since he is having diarrhea and I have reduced his feeding rate from 55 mils an hour.  I will add D5 half-normal saline  4.  Cavitary lesion of the lung ID recommending monitoring yearly for symptoms of cough  5.  Dysphagia.  Patient is cortrack feeding Patient would like to feed orally.  I will request speech to reevaluate.  Addendum.: Speech therapy reevaluated patient and advised regular diet with thin liquid.  So we will going to discontinue his NG tube as he tolerated his feeding of regular diet well  6.  Moderate malnutrition likely secondary to alcohol abuse Continue tube feeding  7.  Bacteremia due to group B streptococcus. Infectious disease was consulted and they recommended linezolid Patient was on penicillin and has been transitioned to linezolid for 2 weeks with end date of August 26, 2021  8.  Alcohol withdrawal seizure. Patient required ventilation with mechanical ventilation to protect airway he has been intubated.  He was on CIWA protocol but that has been discontinued  code Status: Full  Severity of Illness: The appropriate patient status for this patient is INPATIENT. Inpatient status is judged to be reasonable and necessary in order to provide the required intensity of service to ensure the patient's safety. The patient's presenting symptoms, physical exam findings, and initial radiographic and laboratory data in the context of their chronic comorbidities is felt to place them at high risk for further clinical deterioration. Furthermore, it is not anticipated that the patient will be medically stable for discharge from the hospital within 2 midnights of admission. The following factors support the patient status of inpatient.  Tube feeding PEG feeding   * I certify that at the point of admission it is my clinical judgment that the patient will require inpatient hospital care spanning beyond 2 midnights from the point of admission due to high intensity of service, high risk for further deterioration and high frequency of surveillance required.*   Family Communication: None at bedside  Disposition Plan: SNF Status is: Inpatient   Dispo: The patient is from: Home              Anticipated d/c is to:  [facility] consult placed for SNF as wife stated she cannot take care of him at home              Anticipated d/c date is:               Patient currently not medically stable for discharge  [facility] consult placed for SNF as wife stated she cannot take care of him at home  Consultants: PCCM Cardiology Infectious disease Neurology  Procedures: Mechanical ventilation TEE negative for vegetation 2D echo  Antimicrobials: Linezolid continue till August 26, 2021 Was on penicillin G  DVT prophylaxis: Heparin   Objective: Vitals:   08/23/21 0018 08/23/21 0423 08/23/21  0739 08/23/21 1251  BP: (!) 99/53 112/73 110/69 101/68  Pulse: 92 99 91 98  Resp:  20    Temp: 98.2 F (36.8 C) 98.1 F (36.7 C) (!) 97.5 F (36.4 C) (!) 97.4 F (36.3 C)  TempSrc: Axillary Oral Oral Oral  SpO2: 100% 95% 99% 97%  Weight:      Height:        Intake/Output Summary (Last 24 hours) at 08/23/2021 1329 Last data filed at 08/23/2021 1251 Gross per 24 hour  Intake 3668.1 ml  Output 750 ml  Net 2918.1 ml    Filed Weights   08/17/21 0500 08/19/21 0237 08/22/21 0500  Weight: 78 kg 78.1 kg 80 kg   Body mass index is 23.27 kg/m.  Exam:  General: 60 y.o. year-old male well developed well nourished in no acute distress.  Alert and oriented x3.  Feeding tube in place Cardiovascular: Regular rate and rhythm with no rubs or gallops.  No thyromegaly or JVD noted.   Respiratory: Clear  to auscultation with no wheezes or rales. Good inspiratory effort. Abdomen: Soft nontender nondistended with normal bowel sounds x4 quadrants. GU.  Rectal tube in place and producing mostly liquid stool Musculoskeletal: No lower extremity edema. 2/4 pulses in all 4 extremities. Skin: No ulcerative lesions noted or rashes, Psychiatry: Mood is appropriate for condition and setting Neurology:    Data Reviewed: CBC: Recent Labs  Lab 08/18/21 0426 08/19/21 0436 08/20/21 0313 08/21/21 0258 08/22/21 0202 08/23/21 0207  WBC 6.1 6.7 7.2 8.7 7.4 8.0  NEUTROABS 3.8 4.8  --   --   --   --   HGB 10.5* 11.2* 10.7* 11.3* 10.9* 10.0*  HCT 30.6* 32.6* 31.2* 33.9* 33.0* 29.4*  MCV 91.9 93.4 92.3 93.6 92.4 92.7  PLT 238 249 267 277 315 222    Basic Metabolic Panel: Recent Labs  Lab 08/16/21 1703 08/18/21 0426 08/19/21 0436 08/20/21 0313 08/21/21 0258  NA  --  127* 128* 130* 130*  K  --  4.1 4.3 4.1 4.5  CL  --  94* 94* 95* 94*  CO2  --  _0 GLUCOSE  --  133* 107* 125* 106*  BUN  --  _1 CREATININE  --  0.77 0.76 0.70 0.80  CALCIUM  --  8.6* 8.9 8.8* 9.3  MG 2.0 1.6*  1.6*  --  1.6*  PHOS 3.6 3.9 4.6  --   --     GFR: Estimated Creatinine Clearance: 109.6 mL/min (by C-G formula based on SCr of 0.8 mg/dL). Liver Function Tests: Recent Labs  Lab 08/18/21 0426 08/19/21 0436  AST 56* 50*  ALT 35 35  ALKPHOS 63 65  BILITOT 0.4 0.3  PROT 6.2* 6.5  ALBUMIN 1.6* 1.8*    No results for input(s): LIPASE, AMYLASE in the last 168 hours. No results for input(s): AMMONIA in the last 168 hours. Coagulation Profile: No results for input(s): INR, PROTIME in the last 168 hours. Cardiac Enzymes: No results for input(s): CKTOTAL, CKMB, CKMBINDEX, TROPONINI in the last 168 hours. BNP (last 3 results) No results for input(s): PROBNP in the last 8760 hours. HbA1C: No results for input(s): HGBA1C in the last 72 hours. CBG: Recent Labs  Lab 08/22/21 1933 08/22/21 2339 08/23/21 0356 08/23/21 0726 08/23/21 1127  GLUCAP 99 103* 122* 106* 111*    Lipid Profile: No results for input(s): CHOL, HDL, LDLCALC, TRIG, CHOLHDL, LDLDIRECT in the last 72 hours. Thyroid Function Tests: No results for input(s): TSH, T4TOTAL, FREET4, T3FREE, THYROIDAB in the last 72 hours. Anemia Panel: No results for input(s): VITAMINB12, FOLATE, FERRITIN, TIBC, IRON, RETICCTPCT in the last 72 hours. Urine analysis:    Component Value Date/Time   COLORURINE YELLOW 08/10/2021 1613   APPEARANCEUR HAZY (A) 08/10/2021 1613   LABSPEC 1.012 08/10/2021 1613   PHURINE 5.0 08/10/2021 1613   GLUCOSEU NEGATIVE 08/10/2021 1613   HGBUR MODERATE (A) 08/10/2021 1613   BILIRUBINUR NEGATIVE 08/10/2021 1613   KETONESUR NEGATIVE 08/10/2021 1613   PROTEINUR NEGATIVE 08/10/2021 1613   UROBILINOGEN 1.0 03/02/2015 1136   NITRITE NEGATIVE 08/10/2021 1613   LEUKOCYTESUR NEGATIVE 08/10/2021 1613   Sepsis Labs: _2 (procalcitonin:4,lacticidven:4)  )No results found for this or any previous visit (from the past 240 hour(s)).    Studies: No results found.  Scheduled Meds:  carvedilol   6.25 mg Per Tube BID WC   chlorhexidine gluconate (MEDLINE KIT)  15 mL Mouth Rinse BID   Chlorhexidine Gluconate Cloth  6 each Topical Daily  feeding supplement (PROSource TF)  45 mL Per Tube BID   folic acid  1 mg Per Tube Daily   insulin aspart  0-6 Units Subcutaneous TID WC   levETIRAcetam  1,000 mg Per Tube BID   linezolid  600 mg Per Tube Q12H   LORazepam  0-4 mg Intravenous Q12H   multivitamin with minerals  1 tablet Per Tube Daily   [START ON 08/25/2021] thiamine  100 mg Oral Daily    Continuous Infusions:  sodium chloride Stopped (08/17/21 1750)   sodium chloride Stopped (08/18/21 2240)   dextrose 5 % and 0.2 % NaCl 75 mL/hr at 08/22/21 2324   feeding supplement (JEVITY 1.5 CAL/FIBER) 1,000 mL (08/22/21 1719)   heparin 1,850 Units/hr (08/22/21 1106)   thiamine injection 250 mg (08/23/21 0920)     LOS: 13 days     Cristal Deer, MD [facility]  To reach me or the doctor on call, go to: [email or web address] Password [facility]  08/23/2021, 1:29 PM

## 2021-08-23 NOTE — Progress Notes (Signed)
ANTICOAGULATION CONSULT NOTE - Follow Up Consult  Pharmacy Consult for Heparin Indication:  VTE  No Known Allergies  Patient Measurements: Height: 6\' 1"  (185.4 cm) Weight: 80 kg (176 lb 5.9 oz) IBW/kg (Calculated) : 79.9 Heparin Dosing Weight:  78.1 kg  Vital Signs: Temp: 98.1 F (36.7 C) (09/18 0423) Temp Source: Oral (09/18 0423) BP: 112/73 (09/18 0423) Pulse Rate: 99 (09/18 0423)  Labs: Recent Labs    08/21/21 0258 08/22/21 0202 08/23/21 0207  HGB 11.3* 10.9* 10.0*  HCT 33.9* 33.0* 29.4*  PLT 277 315 352  HEPARINUNFRC 0.39 0.44 0.51  CREATININE 0.80  --   --      Estimated Creatinine Clearance: 109.6 mL/min (by C-G formula based on SCr of 0.8 mg/dL).  Assessment: Pt continues on heparin gtt for DVT.  Heparin level is (0.51) remains therapeutic on Heparin infusion 1850 units/hr  No bleeding reported. CBC is stable  Goal of Therapy:  Heparin level 0.3-0.7 units/ml Monitor platelets by anticoagulation protocol: Yes   Plan:  Continue IV heparin 1850 units/hr Daily heparin level and CBC Monitor for bleeding.  Follow up for oral anticoagulation plans.  Benefits copay check done by [facility] pharmacist: his copay is $0 (zero) for both Eliquis and Xarelto.   Shatoya Roets A. [facility], PharmD, BCPS, FNKF Clinical Pharmacist [place] Please utilize Amion for appropriate phone number to reach the unit pharmacist [facility])  08/23/2021,7:16 AM

## 2021-08-23 NOTE — Evaluation (Signed)
Clinical/Bedside Swallow Evaluation Patient Details  Name: Frederick [name] MRN: [id number] Date of Birth: [birth year]  Today's Date: 08/23/2021 Time: SLP Start Time (ACUTE ONLY): 1546 SLP Stop Time (ACUTE ONLY): 1554 SLP Time Calculation (min) (ACUTE ONLY): 8 min  Past Medical History:  Past Medical History:  Diagnosis Date   Arthritis    Asthma    Bilateral lower extremity edema 05/28/2021   propping feet up   Cough 05/28/2021   with clear sputum   Dyspnea    Dyspnea 05/28/2021   with exertion   EtOH dependence (HCC)    drinks 2-3 of 40ounes a day   Fibula fracture    Dr Dion Saucier 02-2011   Finger osteomyelitis, right (HCC) 05/26/2021   right index finger   Hernia    surgery 01-17-13   Hypertension    not taking bp meds since may 2022   Tuberculosis    couple of yrs ago took tx at [facility] pt on 05-28-2021   Past Surgical History:  Past Surgical History:  Procedure Laterality Date   AMPUTATION Right 05/29/2021   Procedure: PARTIAL RIGHT 2ND FINGER AMPUTATION;  Surgeon: Kathryne Hitch, MD;  Location: [facility];  Service: Orthopedics;  Laterality: Right;   ANKLE SURGERY     INGUINAL HERNIA REPAIR Right 01/19/2013   Procedure: HERNIA REPAIR INGUINAL ADULT;  Surgeon: [name], MD,FACS;  Location: [facility] OR;  Service: General;  Laterality: Right;   TEE WITHOUT CARDIOVERSION N/A 08/14/2021   Procedure: TRANSESOPHAGEAL ECHOCARDIOGRAM (TEE);  Surgeon: Wendall Stade, MD;  Location[facility] ENDOSCOPY;  Service: Cardiovascular;  Laterality: N/A;   TIBIA IM NAIL INSERTION Left 01/22/2013   Procedure: INTRAMEDULLARY (IM) NAIL TIBIAL;  Surgeon: [name], MD;  Location: [facility] OR;  Service: Orthopedics;  Laterality: Left;   HPI:  Frederick [name] is a 60 y.o. male with tobacco abuse, ETOH abuse (significantly > 80 oz beer per day, liquor per wife). Admitted with 1 week history of recurrent focal seizure activity. CT atrophy with small vessel  chronic ischemic changes of deep cerebral white matter. 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 Required intubated 9/5-9/7. Other PMH: TBI x 2 (2001, 2016), asthma, cough.    Assessment / Plan / Recommendation  Clinical Impression  New orders received to re-evaluation swallow function.  Pt awake/alert and requesting PO diet.  Pt presents with functional swallowing as assessed clinically.  There were 2 delayed coughs which did not seem related to PO intake.  Pt denied feeling of food drink going the wrong way. RN reports pt has been coughing intermittently throughout the day.  Pt exhibited good oral clearance of solids, including graham cracker which pt consumed whole in one bite. Pt tolerated serial straw sips of thin liquid without any clinical s/s of aspiration.    Recommend regular texture diet with thin liquids. SLP to follow for diet tolerance.  SLP Visit Diagnosis: Dysphagia, unspecified (R13.10)    Aspiration Risk  Mild aspiration risk    Diet Recommendation Regular;Thin liquid   Liquid Administration via: Cup;Straw Medication Administration:  (No specific precautions.) Supervision: Patient able to self feed;Staff to assist with self feeding Compensations: Slow rate;Small sips/bites Postural Changes: Seated upright at 90 degrees    Other  Recommendations Oral Care Recommendations: Oral care BID    Recommendations for follow up therapy are one component of a multi-disciplinary discharge planning process, led by the attending physician.  Recommendations may be updated based on patient status,  additional functional criteria and insurance authorization.  Follow up Recommendations None      Frequency and Duration min 1 x/week  1 week       Prognosis Prognosis for Safe Diet Advancement: Good      Swallow Study   General Date of Onset: 08/10/21 HPI: Frederick [name] is a 60 y.o. male with tobacco abuse, ETOH abuse (significantly > 80 oz beer per day, liquor per  wife). Admitted with 1 week history of recurrent focal seizure activity. CT atrophy with small vessel chronic ischemic changes of deep cerebral white matter. 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 Required intubated 9/5-9/7. Other PMH: TBI x 2 (2001, 2016), asthma, cough. Type of Study: Bedside Swallow Evaluation Previous Swallow Assessment: clinical assessment and management Diet Prior to this Study: NPO;NG Tube Temperature Spikes Noted: No History of Recent Intubation: Yes Length of Intubations (days): 2 days Date extubated: 08/12/21 Behavior/Cognition: Alert;Cooperative;Pleasant mood Oral Care Completed by SLP: No Oral Cavity - Dentition: Missing dentition Vision: Functional for self-feeding Self-Feeding Abilities: Needs set up;Needs assist Patient Positioning: Upright in bed Baseline Vocal Quality: Normal Volitional Cough: Strong Volitional Swallow: Able to elicit    Oral/Motor/Sensory Function Overall Oral Motor/Sensory Function: Within functional limits Facial ROM: Within Functional Limits Facial Symmetry: Within Functional Limits Lingual ROM: Within Functional Limits Lingual Symmetry: Within Functional Limits Lingual Strength: Within Functional Limits Velum: Within Functional Limits Mandible: Within Functional Limits   Ice Chips Ice chips: Not tested   Thin Liquid Thin Liquid: Within functional limits Presentation: Cup;Straw    Nectar Thick Nectar Thick Liquid: Not tested   Honey Thick Honey Thick Liquid: Not tested   Puree Puree: Within functional limits Presentation: Spoon   Solid     Solid: Within functional limits Presentation: Self Fed      Kerrie Pleasure, MA, CCC-SLP Acute Rehabilitation Services Office: [phone]; Pager [phone]): [phone] 08/23/2021,4:05 PM

## 2021-08-23 NOTE — Plan of Care (Signed)
Pt is alert oriented x 3. Pt verbalized hunger and desire to try to eat and get out of the bed soon. Pt would like to speak with doctor about these concerns. Pt has been irritable but has not shown any symptoms of withdrawal. Pt has rested well, cooperative with care.    Problem: Education: Goal: Knowledge of General Education information will improve Description: Including pain rating scale, medication(s)/side effects and non-pharmacologic comfort measures Outcome: Progressing   Problem: Health Behavior/Discharge Planning: Goal: Ability to manage health-related needs will improve Outcome: Progressing   Problem: Clinical Measurements: Goal: Ability to maintain clinical measurements within normal limits will improve Outcome: Progressing Goal: Will remain free from infection Outcome: Progressing Goal: Diagnostic test results will improve Outcome: Progressing Goal: Respiratory complications will improve Outcome: Progressing Goal: Cardiovascular complication will be avoided Outcome: Progressing   Problem: Activity: Goal: Risk for activity intolerance will decrease Outcome: Progressing   Problem: Nutrition: Goal: Adequate nutrition will be maintained Outcome: Progressing   Problem: Coping: Goal: Level of anxiety will decrease Outcome: Progressing   Problem: Elimination: Goal: Will not experience complications related to bowel motility Outcome: Progressing Goal: Will not experience complications related to urinary retention Outcome: Progressing   Problem: Pain Managment: Goal: General experience of comfort will improve Outcome: Progressing   Problem: Safety: Goal: Ability to remain free from injury will improve Outcome: Progressing   Problem: Education: Goal: Knowledge of disease or condition will improve Outcome: Progressing Goal: Understanding of discharge needs will improve Outcome: Progressing   Problem: Skin Integrity: Goal: Risk for impaired skin integrity  will decrease Outcome: Progressing   Problem: Health Behavior/Discharge Planning: Goal: Ability to identify changes in lifestyle to reduce recurrence of condition will improve Outcome: Progressing Goal: Identification of resources available to assist in meeting health care needs will improve Outcome: Progressing   Problem: Physical Regulation: Goal: Complications related to the disease process, condition or treatment will be avoided or minimized Outcome: Progressing   Problem: Safety: Goal: Ability to remain free from injury will improve Outcome: Progressing

## 2021-08-24 DIAGNOSIS — J984 Other disorders of lung: Secondary | ICD-10-CM | POA: Diagnosis not present

## 2021-08-24 DIAGNOSIS — I5021 Acute systolic (congestive) heart failure: Secondary | ICD-10-CM | POA: Diagnosis not present

## 2021-08-24 DIAGNOSIS — R7881 Bacteremia: Secondary | ICD-10-CM | POA: Diagnosis not present

## 2021-08-24 DIAGNOSIS — F10239 Alcohol dependence with withdrawal, unspecified: Secondary | ICD-10-CM | POA: Diagnosis not present

## 2021-08-24 LAB — GLUCOSE, CAPILLARY
Glucose-Capillary: 108 mg/dL — ABNORMAL HIGH (ref 70–99)
Glucose-Capillary: 74 mg/dL (ref 70–99)
Glucose-Capillary: 96 mg/dL (ref 70–99)
Glucose-Capillary: 100 mg/dL — ABNORMAL HIGH (ref 70–99)
Glucose-Capillary: 113 mg/dL — ABNORMAL HIGH (ref 70–99)

## 2021-08-24 LAB — HEPARIN LEVEL (UNFRACTIONATED): Heparin Unfractionated: 0.64 IU/mL (ref 0.30–0.70)

## 2021-08-24 MED ORDER — NICOTINE 21 MG/24HR TD PT24
21.0000 mg | MEDICATED_PATCH | Freq: Every day | TRANSDERMAL | Status: DC
  Administered 2021-08-24 – 2021-08-28 (×5): 21 mg via TRANSDERMAL
  Filled 2021-08-24 (×5): qty 1

## 2021-08-24 MED ORDER — LORAZEPAM 2 MG/ML IJ SOLN
2.0000 mg | Freq: Once | INTRAMUSCULAR | Status: AC
  Administered 2021-08-24: 2 mg via INTRAVENOUS

## 2021-08-24 MED ORDER — ENSURE ENLIVE PO LIQD
237.0000 mL | Freq: Two times a day (BID) | ORAL | Status: DC
  Administered 2021-08-24 – 2021-08-26 (×6): 237 mL via ORAL

## 2021-08-24 MED ORDER — LORAZEPAM 2 MG/ML IJ SOLN
INTRAMUSCULAR | Status: AC
  Filled 2021-08-24: qty 1

## 2021-08-24 MED ORDER — LORAZEPAM 1 MG PO TABS
1.0000 mg | ORAL_TABLET | Freq: Once | ORAL | Status: AC
  Administered 2021-08-24: 1 mg via ORAL
  Filled 2021-08-24: qty 1

## 2021-08-24 NOTE — Progress Notes (Signed)
ANTICOAGULATION CONSULT NOTE - Follow Up Consult  Pharmacy Consult for Heparin Indication: VTE treatment  No Known Allergies  Patient Measurements: Height: 6\' 1"  (185.4 cm) Weight: 80 kg (176 lb 5.9 oz) IBW/kg (Calculated) : 79.9 Heparin Dosing Weight: 80 kg  Vital Signs: Temp: 98.4 F (36.9 C) (09/19 0909) Temp Source: Oral (09/19 0909) BP: 120/78 (09/19 0909) Pulse Rate: 88 (09/19 0909)  Labs: Recent Labs    08/22/21 0202 08/23/21 0207 08/24/21 0532  HGB 10.9* 10.0*  --   HCT 33.0* 29.4*  --   PLT 315 352  --   HEPARINUNFRC 0.44 0.51 0.64    Estimated Creatinine Clearance: 109.6 mL/min (by C-G formula based on SCr of 0.8 mg/dL).  Assessment:  60 yr old male continues on IV heparin for 9/13 10/13 doppler: acute superficial vein thrombosis involving the left cephalic vein and age indeterminate deep vein thrombosis involving the left subclavian vein.    Heparin level remains therapeutic (0.64) on 1850 units/hr.  No CBC today  Goal of Therapy:  Heparin level 0.3-0.7 units/ml Monitor platelets by anticoagulation protocol: Yes   Plan:  Continue heparin drip at 1850 units/hr. Daily heparin level and CBC > added back for 9/20. Monitor for bleeding. Follow up for oral anticoagulation plans. Benefits copay check done by [facility] pharmacist on 9/16: his copay is $0 (zero) for both Eliquis and Xarelto.    10/16, RPh 08/24/2021,10:34 AM

## 2021-08-24 NOTE — NC FL2 (Signed)
[place] LEVEL OF CARE SCREENING TOOL     IDENTIFICATION  Patient Name: Frederick [name] Birthdate: 1961/07/04 Sex: male Admission Date (Current Location): 08/10/2021  [facility] and [place] Number:  Herbalist and Address:  [facility] [place], [place] [address], [place], [place] [address]      Provider Number: [id number]  Attending Physician Name and Address:  Oswald Hillock, MD  Relative Name and Phone Number:       Current Level of Care: Hospital Recommended Level of Care: [place] Prior Approval Number:    Date Approved/Denied:   PASRR Number: [id number]  Discharge Plan: SNF    Current Diagnoses: Patient Active Problem List   Diagnosis Date Noted   Alcohol withdrawal seizure with complication, with unspecified complication ([place]) 30/08/2329   CKD (chronic kidney disease), stage III ([place]) 08/19/2021   Dysphagia 08/19/2021   Malnutrition of moderate degree 08/12/2021   Bacteremia due to group B Streptococcus    Acute systolic CHF (congestive heart failure) ([place])    Seizures ([place]) 08/10/2021   Status epilepticus ([place])    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   Osteomyelitis of index finger of right hand ([place]) 05/28/2021   Marijuana abuse 07/22/2017   Hyponatremia 12/03/2015   Left rib fracture 11/29/2015   Microcytic anemia 11/29/2015   Cigarette smoker 11/29/2015   Moderate protein-calorie malnutrition ([place]) 11/29/2015   Unintentional weight loss 11/29/2015   History of tuberculosis 11/28/2015   Cavitary lesion of lung 11/28/2015   SBO (small bowel obstruction) ([place]) 03/10/2013   Abdominal pain, diffuse 03/10/2013   Nausea and vomiting 03/10/2013   Fracture of fibula, proximal 01/21/2013   Closed fracture of left distal distal tibia 01/21/2013   EtOH dependence ([place])    Fibula fracture    Hernia     Orientation RESPIRATION BLADDER Height & Weight     Self  Normal Incontinent  Weight: 176 lb 5.9 oz (80 kg) Height:  _0  (185.4 cm)  BEHAVIORAL SYMPTOMS/MOOD NEUROLOGICAL BOWEL NUTRITION STATUS    Convulsions/Seizures Incontinent Diet (see dc summary)  AMBULATORY STATUS COMMUNICATION OF NEEDS Skin   Extensive Assist Verbally Normal                       Personal Care Assistance Level of Assistance  Bathing, Dressing, Feeding Bathing Assistance: Maximum assistance Feeding assistance: Maximum assistance Dressing Assistance: Maximum assistance     Functional Limitations Info  Speech     Speech Info: Impaired (delayed responses)    SPECIAL CARE FACTORS FREQUENCY  PT (By licensed PT), Speech therapy, OT (By licensed OT)     PT Frequency: 5x/week OT Frequency: 5x/week     Speech Therapy Frequency: 2x/week      Contractures Contractures Info: Not present    Additional Factors Info  Code Status, Allergies, Insulin Sliding Scale Code Status Info: Full Allergies Info: NKA   Insulin Sliding Scale Info: See dc summary       Current Medications (08/24/2021):  This is the current hospital active medication list Current Facility-Administered Medications  Medication Dose Route Frequency Provider Last Rate Last Admin   (feeding supplement) PROSource Plus liquid 30 mL  30 mL Oral BID BM Cristal Deer, MD   30 mL at 08/23/21 2148   0.9 %  sodium chloride infusion   Intravenous PRN Margaretha Seeds, MD   Stopped at 08/17/21 1750   0.9 %  sodium chloride  infusion   Intravenous PRN Margaretha Seeds, MD   Stopped at 08/18/21 2240   carvedilol (COREG) tablet 6.25 mg  6.25 mg Oral BID WC Cristal Deer, MD       chlorhexidine gluconate (MEDLINE KIT) (PERIDEX) 0.12 % solution 15 mL  15 mL Mouth Rinse BID Josue Hector, MD   15 mL at 08/23/21 2153   Chlorhexidine Gluconate Cloth 2 % PADS 6 each  6 each Topical Daily Josue Hector, MD   6 each at 08/23/21 1649   dextrose 5 % and 0.2 % NaCl infusion   Intravenous Continuous Cristal Deer, MD 75 mL/hr  at 08/23/21 2309 New Bag at 08/23/21 2309   docusate sodium (COLACE) capsule 100 mg  100 mg Oral BID PRN Cristal Deer, MD       feeding supplement (JEVITY 1.5 CAL/FIBER) liquid 1,000 mL  1,000 mL Per Tube Continuous Cristal Deer, MD 45 mL/hr at 08/22/21 1719 1,000 mL at 62/83/15 1761   folic acid (FOLVITE) tablet 1 mg  1 mg Oral Daily Cristal Deer, MD       heparin ADULT infusion 100 units/mL (25000 units/219m)  1,850 Units/hr Intravenous Continuous AFranky Macho RPH 18.5 mL/hr at 08/24/21 0631 1,850 Units/hr at 08/24/21 0631   insulin aspart (novoLOG) injection 0-6 Units  0-6 Units Subcutaneous TID WC Swayze, Ava, DO       ipratropium-albuterol (DUONEB) 0.5-2.5 (3) MG/3ML nebulizer solution 3 mL  3 mL Nebulization Q6H PRN NJosue Hector MD       labetalol (NORMODYNE) injection 10 mg  10 mg Intravenous Q2H PRN NJosue Hector MD   10 mg at 08/14/21 1716   levETIRAcetam (KEPPRA) tablet 1,000 mg  1,000 mg Oral BID UCristal Deer MD       linezolid (ZYVOX) tablet 600 mg  600 mg Oral Q12H UCristal Deer MD       LORazepam (ATIVAN) 2 MG/ML injection            LORazepam (ATIVAN) injection 0-4 mg  0-4 mg Intravenous Q12H KRise Patience MD   2 mg at 08/23/21 2303   multivitamin with minerals tablet 1 tablet  1 tablet Oral Daily UCristal Deer MD       ondansetron ([facility] injection 4 mg  4 mg Intravenous Q6H PRN NJosue Hector MD       polyethylene glycol (MIRALAX / GLYCOLAX) packet 17 g  17 g Oral Daily PRN UCristal Deer MD       thiamine (B-1) 250 mg in sodium chloride 0.9 % 50 mL IVPB  250 mg Intravenous Daily PElodia Florence, MD 100 mL/hr at 08/23/21 0920 250 mg at 08/23/21 0920   Followed by   [Derrill MemoON 08/25/2021] thiamine tablet 100 mg  100 mg Oral Daily PElodia [place], MD         Discharge Medications: Please see discharge summary for a list of discharge medications.  Relevant Imaging Results:  Relevant Lab Results:   Additional  Information SSN: [place]

## 2021-08-24 NOTE — Progress Notes (Signed)
[facility]  PROGRESS NOTE  JULIA KULZER IHW:[id number] DOB: 1961-05-05 DOA: 08/10/2021 PCP: [name]   Brief HPI:    60 year old male with history of tobacco abuse, alcohol abuse, history of TB status posttreatment in 2001 in 2016, presented with 1 week history of focal recurrent seizure activity.  EMS was called and patient was actively seizing with generalized tonic-clonic seizure.  He was brought to ED, required intubation for airway protection.  He was admitted to the ICU.  Neurology was consulted for status epilepticus in setting of alcohol withdrawal.  Started on Castana.  Hospitalization was complicated by aspiration pneumonia as well as strep anginosis bacteremia.  TEE was negative for vegetations.  Echocardiogram on admission showed reduced EF of 25 to 30% which improved to 45% on TEE.  He was started on tube feeds by cortrack.  Patient continues to be encephalopathic.  ID was consulted for bacteremia.  Antibiotic coverage was changed from penicillin G to linezolid for total duration of 2 weeks of antibiotic therapy.  Last dose of antibiotics will be on 08/26/2021.  Will also require outpatient CT chest in 8 weeks to monitor for B symptoms. TRH following as of 08/17/2021   Subjective   Patient seen and examined, continues to be pleasantly confused.   Assessment/Plan:    Status epilepticus -Resolved -EEG on 9/5 showed no epileptiform discharges -Overnight EEG was suggestive of severe diffuse encephalopathy -Neurology was consulted; started on Keppra 1000 mg p.o. twice daily -Neurology has signed off, recommended to continue Keppra 1000 mg p.o. twice daily, likely can be weaned off in the next few months given provoked in setting of alcohol withdrawal -Continue as needed Versed for seizure -Follow-up neurology in 6 to 8 weeks  Streptococcus anginosus bacteremia -Concern for aspiration pneumonia -Blood culture from 08/10/2021 grew strep anginosus -Was started on  penicillin G, switched to linezolid for 2 weeks; stop date 08/26/2021 -CT chest//abdomen/pelvis showed persistent cavitary changes in right upper lobe.  No evidence of abscess or acute finding on CT maxillofacial/face -ID was consulted, recommended linezolid, repeat labs and repeat imaging in 6 to 8 weeks  Persistent cavitating changes within the right upper lobe -Prominence of bilateral nodular airspace disease-concerning for chronic infection such as Mycobacterium or fungus -History of pulmonary TB, status posttreatment in 2016 -ID recommended yearly monitoring-consider repeat CT chest in 6 to 8 weeks  Alcohol withdrawal -Started on Ativan as needed, CIWA protocol -Continue to monitor  Metabolic encephalopathy -He was given high-dose thiamine for possible Wernicke's encephalopathy -MRI showed no acute abnormality -Vitamin B12 normal, folate within normal limits, RPR nonreactive  History of stroke -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 internal capsule  Age indeterminate DVT of left subclavian/superficial vein thrombosis of left cephalic vein -Started on heparin GTT  Dysphagia -Severe aspiration risk; moderate protein calorie malnutrition -He was started on core track tube feeds -Tube feed was removed after patient was seen by speech therapy and started on regular diet with thin liquids      Data Reviewed:   CBG:  Recent Labs  Lab 08/23/21 1927 08/23/21 2344 08/24/21 0456 08/24/21 0910 08/24/21 1226  GLUCAP 106* 103* 108* 74 96    SpO2: 99 % O2 Flow Rate (L/min): 2 L/min FiO2 (%): 30 % (Titrated to 30%, sats 100%.)    Vitals:   08/24/21 0011 08/24/21 0421 08/24/21 0909 08/24/21 1227  BP: (!) 104/53 116/75 120/78 110/70  Pulse: 91 91 88 97  Resp: _0 Temp:  98 F (36.7 C) 98.2 F (36.8 C) 98.4 F (36.9 C) 98.5 F (36.9 C)  TempSrc: Oral Oral Oral   SpO2: 98% 97% 99%   Weight:      Height:         Intake/Output Summary (Last 24  hours) at 08/24/2021 1601 Last data filed at 08/23/2021 1800 Gross per 24 hour  Intake 175.4 ml  Output --  Net 175.4 ml    09/17 1901 - 09/19 0700 In: 4217.4 [I.V.:2907.4] Out: 750 [Urine:750]  Filed Weights   08/17/21 0500 08/19/21 0237 08/22/21 0500  Weight: 78 kg 78.1 kg 80 kg    Data Reviewed: Basic Metabolic Panel: Recent Labs  Lab 08/18/21 0426 08/19/21 0436 08/20/21 0313 08/21/21 0258  NA 127* 128* 130* 130*  K 4.1 4.3 4.1 4.5  CL 94* 94* 95* 94*  CO2 _0 GLUCOSE 133* 107* 125* 106*  BUN _1 CREATININE 0.77 0.76 0.70 0.80  CALCIUM 8.6* 8.9 8.8* 9.3  MG 1.6* 1.6*  --  1.6*  PHOS 3.9 4.6  --   --    Liver Function Tests: Recent Labs  Lab 08/18/21 0426 08/19/21 0436  AST 56* 50*  ALT 35 35  ALKPHOS 63 65  BILITOT 0.4 0.3  PROT 6.2* 6.5  ALBUMIN 1.6* 1.8*   No results for input(s): LIPASE, AMYLASE in the last 168 hours. No results for input(s): AMMONIA in the last 168 hours. CBC: Recent Labs  Lab 08/18/21 0426 08/19/21 0436 08/20/21 0313 08/21/21 0258 08/22/21 0202 08/23/21 0207  WBC 6.1 6.7 7.2 8.7 7.4 8.0  NEUTROABS 3.8 4.8  --   --   --   --   HGB 10.5* 11.2* 10.7* 11.3* 10.9* 10.0*  HCT 30.6* 32.6* 31.2* 33.9* 33.0* 29.4*  MCV 91.9 93.4 92.3 93.6 92.4 92.7  PLT 238 249 267 277 315 352   Cardiac Enzymes: No results for input(s): CKTOTAL, CKMB, CKMBINDEX, TROPONINI in the last 168 hours. BNP (last 3 results) Recent Labs    08/10/21 1537  BNP 401.9*    ProBNP (last 3 results) No results for input(s): PROBNP in the last 8760 hours.  CBG: Recent Labs  Lab 08/23/21 1927 08/23/21 2344 08/24/21 0456 08/24/21 0910 08/24/21 1226  GLUCAP 106* 103* 108* 74 96    No results found for this or any previous visit (from the past 240 hour(s)).   Radiology Reports  No results found.   Scheduled medications:    carvedilol  6.25 mg Oral BID WC   chlorhexidine gluconate (MEDLINE KIT)  15 mL Mouth Rinse BID    Chlorhexidine Gluconate Cloth  6 each Topical Daily   feeding supplement  237 mL Oral BID BM   folic acid  1 mg Oral Daily   insulin aspart  0-6 Units Subcutaneous TID WC   levETIRAcetam  1,000 mg Oral BID   linezolid  600 mg Oral Q12H   LORazepam  0-4 mg Intravenous Q12H   multivitamin with minerals  1 tablet Oral Daily   [START ON 08/25/2021] thiamine  100 mg Oral Daily    Antibiotics: Anti-infectives (From admission, onward)    Start     Dose/Rate Route Frequency Ordered Stop   08/24/21 1000  linezolid (ZYVOX) tablet 600 mg        600 mg Oral Every 12 hours 08/23/21 2225 08/26/21 2359   08/23/21 1000  linezolid (ZYVOX) tablet 600 mg  Status:  Discontinued  600 mg Per Tube Every 12 hours 08/23/21 0714 08/23/21 2225   08/18/21 1230  linezolid (ZYVOX) tablet 600 mg  Status:  Discontinued        600 mg Oral Every 12 hours 08/18/21 1138 08/23/21 0714   08/18/21 1215  linezolid (ZYVOX) 100 MG/5ML suspension 600 mg  Status:  Discontinued        600 mg Per Tube Every 12 hours 08/18/21 1128 08/18/21 1138   08/17/21 1215  penicillin G potassium 4 Million Units in dextrose 5 % 250 mL IVPB  Status:  Discontinued        4 Million Units 250 mL/hr over 60 Minutes Intravenous Every 4 hours 08/17/21 1128 08/18/21 1128   08/13/21 1200  cefTRIAXone (ROCEPHIN) 2 g in sodium chloride 0.9 % 100 mL IVPB  Status:  Discontinued        2 g 200 mL/hr over 30 Minutes Intravenous Every 24 hours 08/13/21 1107 08/17/21 1128   08/12/21 1800  Ampicillin-Sulbactam (UNASYN) 3 g in sodium chloride 0.9 % 100 mL IVPB  Status:  Discontinued        3 g 200 mL/hr over 30 Minutes Intravenous Every 6 hours 08/12/21 1324 08/13/21 1107   08/11/21 1000  Ampicillin-Sulbactam (UNASYN) 3 g in sodium chloride 0.9 % 100 mL IVPB  Status:  Discontinued        3 g 200 mL/hr over 30 Minutes Intravenous Every 8 hours 08/11/21 0902 08/12/21 1324         DVT prophylaxis: Heparin GTT  Code Status: Full code  Family  Communication: No family at bedside   Consultants: PCCM Cardiology Infectious disease  Procedures: Echocardiogram TEE    Objective    Physical Examination:  General-appears in no acute distress Heart-S1-S2, regular, no murmur auscultated Lungs-clear to auscultation bilaterally, no wheezing or crackles auscultated Abdomen-soft, nontender, no organomegaly Extremities-no edema in the lower extremities Neuro-alert, oriented to self only   Status is: Inpatient  Dispo: The patient is from: Home              Anticipated d/c is to: Skilled nursing facility              Anticipated d/c date is: 08/26/2021              Patient currently not stable for discharge  Barrier to discharge-awaiting bed at skilled nursing facility  COVID-19 Labs  No results for input(s): DDIMER, FERRITIN, LDH, CRP in the last 72 hours.  Lab Results  Component Value Date   [place] NEGATIVE 08/10/2021   [place] NEGATIVE 04/16/2020         Oswald Hillock   [facility] If 7PM-7AM, please contact night-coverage at [email or web address], Office  [phone]   08/24/2021, 4:01 PM  LOS: 14 days

## 2021-08-24 NOTE — Progress Notes (Signed)
Physical Therapy Treatment Patient Details Name: Frederick [name] MRN: [id number] DOB: [birth year] Today's Date: 08/24/2021   History of Present Illness Pt is a 60 year old man admitted on 08/10/21 with ETOH induced seizure. Intubated 08/10/21 to 08/12/21. Head CT negative for acute abnormality. Pt on CIWA precautions. 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 Cortrak placed 08/14/21. Left UE DVT found on 9/13, pt placed on heparin IV 12:30pm 9/13. 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    PT Comments    Pt with improved bed mobility and sit/supine today but declined OOB activity.   Participated with exercises but required frequent cues.  Frequent cues for safety. Continue plan of care.    Recommendations for follow up therapy are one component of a multi-disciplinary discharge planning process, led by the attending physician.  Recommendations may be updated based on patient status, additional functional criteria and insurance authorization.  Follow Up Recommendations  SNF;Supervision/Assistance - 24 hour     Equipment Recommendations  None recommended by PT    Recommendations for Other Services       Precautions / Restrictions Precautions Precautions: Fall Precaution Comments: waxing/waning agitation, rectal tube     Mobility  Bed Mobility Overal bed mobility: Needs Assistance Bed Mobility: Rolling;Supine to Sit Rolling: Min assist   Supine to sit: Min assist Sit to supine: Min assist   General bed mobility comments: Cues for sequencing and safety; min A to lift trunk to sit and for legs back to bed; pt able to readjust self in bed using upper extremities    Transfers                 General transfer comment: Pt declined standing today  Ambulation/Gait                 Stairs             Wheelchair Mobility    Modified Rankin (Stroke Patients  Only)       Balance Overall balance assessment: Needs assistance Sitting-balance support: Feet supported;No upper extremity supported Sitting balance-Leahy Scale: Good Sitting balance - Comments: Sat EOB for 8 mins for exercise and weight shifting; requested to lay back down after 8 mins       Standing balance comment: declined attempting                            Cognition Arousal/Alertness: Awake/alert Behavior During Therapy: Flat affect Overall Cognitive Status: Impaired/Different from baseline Area of Impairment: Attention;Following commands;Safety/judgement;Problem solving;Memory;Orientation;Awareness                 Orientation Level: Disoriented to;Time;Situation Current Attention Level: Selective Memory: Decreased short-term memory;Decreased recall of precautions Following Commands: Follows one step commands consistently Safety/Judgement: Decreased awareness of safety;Decreased awareness of deficits Awareness: Emergent Problem Solving: Slow processing;Difficulty sequencing;Requires verbal cues;Requires tactile cues        Exercises General Exercises - Lower Extremity Ankle Circles/Pumps: AROM;Both;10 reps (limited motion on R compared to L) Long Arc Quad: AROM;Both;20 reps;Seated Heel Slides: AROM;Both;10 reps;Supine Hip ABduction/ADduction: AROM;Both;10 reps;Supine Other Exercises Other Exercises: Cues for correct motion and limiting compensation with all; occasional AAROM to promote correct motion    General Comments General comments (skin integrity, edema, etc.): VSS      Pertinent Vitals/Pain Pain Assessment: No/denies pain    Home Living  Prior Function            PT Goals (current goals can now be found in the care plan section) Progress towards PT goals: Progressing toward goals    Frequency    Min 2X/week      PT Plan Current plan remains appropriate    Co-evaluation               AM-PAC PT "6 Clicks" Mobility   Outcome Measure  Help needed turning from your back to your side while in a flat bed without using bedrails?: A Little Help needed moving from lying on your back to sitting on the side of a flat bed without using bedrails?: A Little Help needed moving to and from a bed to a chair (including a wheelchair)?: Total Help needed standing up from a chair using your arms (e.g., wheelchair or bedside chair)?: Total Help needed to walk in hospital room?: Total Help needed climbing 3-5 steps with a railing? : Total 6 Click Score: 10    End of Session   Activity Tolerance: Patient tolerated treatment well Patient left: in bed;with call bell/phone within reach;with bed alarm set Nurse Communication: Mobility status PT Visit Diagnosis: Muscle weakness (generalized) (M62.81);Unsteadiness on feet (R26.81)     Time: 1414-1430 PT Time Calculation (min) (ACUTE ONLY): 16 min  Charges:  $Therapeutic Activity: 8-22 mins                     Frederick [name], PT Acute Rehab Services Pager [phone] [name] [phone]    Frederick [name] 08/24/2021, 2:41 PM

## 2021-08-24 NOTE — Progress Notes (Signed)
Nutrition Follow-up  DOCUMENTATION CODES:   Non-severe (moderate) malnutrition in context of chronic illness  INTERVENTION:   -D/c Prosource TF and Jevity 1.5, due to no feeding access -Ensure Enlive po BID, each supplement provides 350 kcal and 20 grams of protein  -Continue MVI with minerals daily -Discussed with MD regarding possibility of discontinuing CBGS checks and insulin  NUTRITION DIAGNOSIS:   Moderate Malnutrition related to chronic illness (ETOH abuse) as evidenced by mild fat depletion, mild muscle depletion, percent weight loss.  Ongoing  GOAL:   Patient will meet greater than or equal to 90% of their needs  Progressing   MONITOR:   Diet advancement, Labs, Weight trends, TF tolerance, I & O's  REASON FOR ASSESSMENT:   Consult, Ventilator Enteral/tube feeding initiation and management  ASSESSMENT:   60 year old male with PMH of tobacco abuse, EtOH abuse, HTN, COPD/asthma, treated tuberculosis ([place], [place]) with residual right upper lobe scar/bullous and scattered nodular disease. Pt admitted with acute respiratory failure in the setting of new onset seizures and suspect ETOH withdrawal.  9/07 - extubated 9/09 - Cortrak placed (gastric tip confirmed via xray) 9/17- TF decreased to 45 ml/hr, rectal tube placed 9/18- s/p BSE- advanced to regular diet with thin liquids, cortrak d/c  Reviewed I/O's: +1.4 L x 24 hours and +12.9 L since admission  UOP: 750 ml x 24 hours  Case discussed with RN, who reports pt is tolerating current diet well and has been eating most of his meals. Cortrak was removed on 08/23/21.  Spoke with pt at bedside, who reports feeling better today. He is happy that he is able to eat and that his cortrak is now removed; he feels more comfortable now. Pt shares that he has been consuming most of his meals, however, no meal completion data currently available to assess.   Per pt, diarrhea has improved since cortrak  removal.   Discussed importance of good meal and supplement intake to promote healing. Pt amenable to Ensure supplements.   Case discussed with MD regarding possibility of discontinuing insulin and CBG checks and pt is off TF and has no history of DM. MD reports he will determine once he evaluates pt later today.   Medications reviewed and include folic acid, keppra, ativan, and thiamine.   Labs reviewed: CBGS: 74-108 (inpatient orders for glycemic control are 0-6 units insulin aspart TID with meals).    Diet Order:   Diet Order             Diet regular Room service appropriate? Yes; Fluid consistency: Thin  Diet effective now                   EDUCATION NEEDS:   Not appropriate for education at this time  Skin:  Skin Assessment: Reviewed RN Assessment  Last BM:  08/23/21 (type 7)  Height:   Ht Readings from Last 1 Encounters:  08/10/21 6\' 1"  (1.854 m)    Weight:   Wt Readings from Last 1 Encounters:  08/22/21 80 kg    Ideal Body Weight:  83.6 kg  BMI:  Body mass index is 23.27 kg/m.  Estimated Nutritional Needs:   Kcal:  1900-2100  Protein:  95-115 grams  Fluid:  > 1.8 L    08/24/21, RD, LDN, CDCES Registered Dietitian II Certified Diabetes Care and Education Specialist Please refer to [facility] for RD and/or RD on-call/weekend/after hours pager

## 2021-08-25 DIAGNOSIS — J984 Other disorders of lung: Secondary | ICD-10-CM | POA: Diagnosis not present

## 2021-08-25 DIAGNOSIS — R7881 Bacteremia: Secondary | ICD-10-CM | POA: Diagnosis not present

## 2021-08-25 DIAGNOSIS — F10239 Alcohol dependence with withdrawal, unspecified: Secondary | ICD-10-CM | POA: Diagnosis not present

## 2021-08-25 DIAGNOSIS — I5021 Acute systolic (congestive) heart failure: Secondary | ICD-10-CM | POA: Diagnosis not present

## 2021-08-25 LAB — COMPREHENSIVE METABOLIC PANEL
ALT: 16 U/L (ref 0–44)
AST: 22 U/L (ref 15–41)
Albumin: 2 g/dL — ABNORMAL LOW (ref 3.5–5.0)
Alkaline Phosphatase: 56 U/L (ref 38–126)
Anion gap: 8 (ref 5–15)
BUN: 6 mg/dL — ABNORMAL LOW (ref 8–23)
CO2: 22 mmol/L (ref 22–32)
Calcium: 8.8 mg/dL — ABNORMAL LOW (ref 8.9–10.3)
Chloride: 101 mmol/L (ref 98–111)
Creatinine, Ser: 0.82 mg/dL (ref 0.61–1.24)
GFR, Estimated: >60 mL/min (ref >60)
Glucose, Bld: 97 mg/dL (ref 70–99)
Potassium: 4 mmol/L (ref 3.5–5.1)
Sodium: 131 mmol/L — ABNORMAL LOW (ref 135–145)
Total Bilirubin: 0.3 mg/dL (ref 0.3–1.2)
Total Protein: 6.5 g/dL (ref 6.5–8.1)

## 2021-08-25 LAB — CBC
HCT: 30.6 % — ABNORMAL LOW (ref 39.0–52.0)
Hemoglobin: 10.5 g/dL — ABNORMAL LOW (ref 13.0–17.0)
MCH: 31.6 pg (ref 26.0–34.0)
MCHC: 34.3 g/dL (ref 30.0–36.0)
MCV: 92.2 fL (ref 80.0–100.0)
Platelets: 365 10*3/uL (ref 150–400)
RBC: 3.32 MIL/uL — ABNORMAL LOW (ref 4.22–5.81)
RDW: 14.4 % (ref 11.5–15.5)
WBC: 9 10*3/uL (ref 4.0–10.5)
nRBC: 0 % (ref 0.0–0.2)

## 2021-08-25 LAB — GLUCOSE, CAPILLARY
Glucose-Capillary: 103 mg/dL — ABNORMAL HIGH (ref 70–99)
Glucose-Capillary: 109 mg/dL — ABNORMAL HIGH (ref 70–99)
Glucose-Capillary: 94 mg/dL (ref 70–99)
Glucose-Capillary: 82 mg/dL (ref 70–99)

## 2021-08-25 LAB — MAGNESIUM: Magnesium: 1.3 mg/dL — ABNORMAL LOW (ref 1.7–2.4)

## 2021-08-25 LAB — HEPARIN LEVEL (UNFRACTIONATED): Heparin Unfractionated: 0.62 IU/mL (ref 0.30–0.70)

## 2021-08-25 MED ORDER — MAGNESIUM SULFATE 4 GM/100ML IV SOLN
4.0000 g | Freq: Once | INTRAVENOUS | Status: AC
  Administered 2021-08-25: 4 g via INTRAVENOUS
  Filled 2021-08-25: qty 100

## 2021-08-25 NOTE — Progress Notes (Signed)
[facility]  PROGRESS NOTE  Frederick [name] RCB:[id number] DOB: 02/09/1961 DOA: 08/10/2021 PCP: [name]   Brief HPI:    60 year old male with history of tobacco abuse, alcohol abuse, history of TB status posttreatment in 2001 in 2016, presented with 1 week history of focal recurrent seizure activity.  EMS was called and patient was actively seizing with generalized tonic-clonic seizure.  He was brought to ED, required intubation for airway protection.  He was admitted to the ICU.  Neurology was consulted for status epilepticus in setting of alcohol withdrawal.  Started on Sundown.  Hospitalization was complicated by aspiration pneumonia as well as strep anginosis bacteremia.  TEE was negative for vegetations.  Echocardiogram on admission showed reduced EF of 25 to 30% which improved to 45% on TEE.  He was started on tube feeds by cortrack.  Patient continues to be encephalopathic.  ID was consulted for bacteremia.  Antibiotic coverage was changed from penicillin G to linezolid for total duration of 2 weeks of antibiotic therapy.  Last dose of antibiotics will be on 08/26/2021.  Will also require outpatient CT chest in 8 weeks to monitor for B symptoms. TRH following as of 08/17/2021   Subjective   Patient seen and examined, pleasantly confused.   Assessment/Plan:    Status epilepticus -Resolved -EEG on 9/5 showed no epileptiform discharges -Overnight EEG was suggestive of severe diffuse encephalopathy -Neurology was consulted; started on Keppra 1000 mg p.o. twice daily -Neurology has signed off, recommended to continue Keppra 1000 mg p.o. twice daily, likely can be weaned off in the next few months given provoked in setting of alcohol withdrawal -Continue as needed Versed for seizure -Follow-up neurology in 6 to 8 weeks  Streptococcus anginosus bacteremia -Concern for aspiration pneumonia -Blood culture from 08/10/2021 grew strep anginosus -Was started on penicillin G, switched  to linezolid for 2 weeks; stop date 08/26/2021 -CT chest//abdomen/pelvis showed persistent cavitary changes in right upper lobe.  No evidence of abscess or acute finding on CT maxillofacial/face -ID was consulted, recommended linezolid, repeat labs and repeat imaging in 6 to 8 weeks  Persistent cavitating changes within the right upper lobe -Prominence of bilateral nodular airspace disease-concerning for chronic infection such as Mycobacterium or fungus -History of pulmonary TB, status posttreatment in 2016 -ID recommended yearly monitoring-consider repeat CT chest in 6 to 8 weeks  Alcohol withdrawal -Started on Ativan as needed, CIWA protocol -Continue to monitor  Metabolic encephalopathy -He was given high-dose thiamine for possible Wernicke's encephalopathy -MRI showed no acute abnormality -Vitamin B12 normal, folate within normal limits, RPR nonreactive  History of stroke -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    Data Reviewed:   CBG:  Recent Labs  Lab 08/24/21 1603 08/24/21 2005 08/25/21 0619 08/25/21 1145 08/25/21 1558  GLUCAP 100* 113* 103* 109* 94    SpO2: 99 % O2 Flow Rate (L/min): 2 L/min FiO2 (%): 30 % (Titrated to 30%, sats 100%.)    Vitals:   08/25/21 0424 08/25/21 0750 08/25/21 1146 08/25/21 1554  BP: 106/62 107/60 100/64 119/80  Pulse: 89 98 92  88  Resp: '18 20 20 20  ' Temp: 97.8 F (36.6 C) 98.3 F (36.8 C) 98.3 F (36.8 C) 98.3 F (36.8 C)  TempSrc: Oral Oral Oral Oral  SpO2: 99% 98%  98% 99%  Weight:      Height:         Intake/Output Summary (Last 24 hours) at 08/25/2021 1805 Last data filed at 08/25/2021 1600 Gross per 24 hour  Intake --  Output 2750 ml  Net -2750 ml    09/18 1901 - 09/20 0700 In: 472.4 [I.V.:422.4] Out: 1500 [Urine:1500]  Filed Weights   08/17/21 0500 08/19/21 0237 08/22/21 0500  Weight: 78 kg 78.1 kg 80 kg    Data Reviewed: Basic Metabolic Panel: Recent Labs  Lab 08/19/21 0436 08/20/21 0313 08/21/21 0258 08/25/21 0300  NA 128* 130* 130* 131*  K 4.3 4.1 4.5 4.0  CL 94* 95* 94* 101  CO2 '27 24 26 22  ' GLUCOSE 107* 125* 106* 97  BUN '10 9 9 ' 6*  CREATININE 0.76 0.70 0.80 0.82  CALCIUM 8.9 8.8* 9.3 8.8*  MG 1.6*  --  1.6* 1.3*  PHOS 4.6  --   --   --    Liver Function Tests: Recent Labs  Lab 08/19/21 0436 08/25/21 0300  AST 50* 22  ALT 35 16  ALKPHOS 65 56  BILITOT 0.3 0.3  PROT 6.5 6.5  ALBUMIN 1.8* 2.0*   No results for input(s): LIPASE, AMYLASE in the last 168 hours. No results for input(s): AMMONIA in the last 168 hours. CBC: Recent Labs  Lab 08/19/21 0436 08/20/21 0313 08/21/21 0258 08/22/21 0202 08/23/21 0207 08/25/21 0300  WBC 6.7 7.2 8.7 7.4 8.0 9.0  NEUTROABS 4.8  --   --   --   --   --   HGB 11.2* 10.7* 11.3* 10.9* 10.0* 10.5*  HCT 32.6* 31.2* 33.9* 33.0* 29.4* 30.6*  MCV 93.4 92.3 93.6 92.4 92.7 92.2  PLT 249 267 277 315 352 365   Cardiac Enzymes: No results for input(s): CKTOTAL, CKMB, CKMBINDEX, TROPONINI in the last 168 hours. BNP (last 3 results) Recent Labs    08/10/21 1537  BNP 401.9*    ProBNP (last 3 results) No results for input(s): PROBNP in the last 8760 hours.  CBG: Recent Labs  Lab 08/24/21 1603 08/24/21 2005 08/25/21 0619 08/25/21 1145 08/25/21 1558  GLUCAP 100* 113* 103* 109* 94    No results found for this or any previous visit (from the past 240 hour(s)).   Radiology Reports  No results found.   Scheduled medications:    carvedilol  6.25 mg Oral BID WC    chlorhexidine gluconate (MEDLINE KIT)  15 mL Mouth Rinse BID   Chlorhexidine Gluconate Cloth  6 each Topical Daily   feeding supplement  237 mL Oral BID BM   folic acid  1 mg Oral Daily   insulin aspart  0-6 Units Subcutaneous TID WC   levETIRAcetam  1,000 mg Oral BID   linezolid  600 mg Oral Q12H   multivitamin with minerals  1 tablet Oral Daily   nicotine  21 mg Transdermal Daily   thiamine  100 mg Oral Daily    Antibiotics: Anti-infectives (From admission, onward)    Start     Dose/Rate Route Frequency Ordered Stop   08/24/21 1000  linezolid (ZYVOX) tablet 600 mg        600 mg Oral Every 12 hours 08/23/21 2225 08/26/21 2359   08/23/21 1000  linezolid (ZYVOX) tablet 600  mg  Status:  Discontinued        600 mg Per Tube Every 12 hours 08/23/21 0714 08/23/21 2225   08/18/21 1230  linezolid (ZYVOX) tablet 600 mg  Status:  Discontinued        600 mg Oral Every 12 hours 08/18/21 1138 08/23/21 0714   08/18/21 1215  linezolid (ZYVOX) 100 MG/5ML suspension 600 mg  Status:  Discontinued        600 mg Per Tube Every 12 hours 08/18/21 1128 08/18/21 1138   08/17/21 1215  penicillin G potassium 4 Million Units in dextrose 5 % 250 mL IVPB  Status:  Discontinued        4 Million Units 250 mL/hr over 60 Minutes Intravenous Every 4 hours 08/17/21 1128 08/18/21 1128   08/13/21 1200  cefTRIAXone (ROCEPHIN) 2 g in sodium chloride 0.9 % 100 mL IVPB  Status:  Discontinued        2 g 200 mL/hr over 30 Minutes Intravenous Every 24 hours 08/13/21 1107 08/17/21 1128   08/12/21 1800  Ampicillin-Sulbactam (UNASYN) 3 g in sodium chloride 0.9 % 100 mL IVPB  Status:  Discontinued        3 g 200 mL/hr over 30 Minutes Intravenous Every 6 hours 08/12/21 1324 08/13/21 1107   08/11/21 1000  Ampicillin-Sulbactam (UNASYN) 3 g in sodium chloride 0.9 % 100 mL IVPB  Status:  Discontinued        3 g 200 mL/hr over 30 Minutes Intravenous Every 8 hours 08/11/21 0902 08/12/21 1324         DVT prophylaxis: Heparin  GTT  Code Status: Full code  Family Communication: No family at bedside   Consultants: PCCM Cardiology Infectious disease  Procedures: Echocardiogram TEE    Objective    Physical Examination:  General-appears in no acute distress Heart-S1-S2, regular, no murmur auscultated Lungs-clear to auscultation bilaterally, no wheezing or crackles auscultated Abdomen-soft, nontender, no organomegaly Extremities-no edema in the lower extremities Neuro-alert, oriented x 2, no focal deficit noted   Status is: Inpatient  Dispo: The patient is from: Home              Anticipated d/c is to: Skilled nursing facility              Anticipated d/c date is: 08/26/2021              Patient currently not stable for discharge  Barrier to discharge-awaiting bed at skilled nursing facility  COVID-19 Labs  No results for input(s): DDIMER, FERRITIN, LDH, CRP in the last 72 hours.  Lab Results  Component Value Date   [place] NEGATIVE 08/10/2021   [place] NEGATIVE 04/16/2020         Oswald Hillock   [facility] If 7PM-7AM, please contact night-coverage at [email or web address], Office  [phone]   08/25/2021, 6:05 PM  LOS: 15 days

## 2021-08-25 NOTE — Progress Notes (Signed)
Speech Language Pathology Treatment: Dysphagia  Patient Details Name: Frederick [name] MRN: [id number] DOB: [birth year] Today's Date: 08/25/2021 Time: 0092-3300 SLP Time Calculation (min) (ACUTE ONLY): 21 min  Assessment / Plan / Recommendation Clinical Impression  Pt seen to assess po tolerance with dietary advancement.  Pt appears with tremoring of hands and head during session.  SLP facilitated session by providing pt with cup/straws for self feeding to prevent/decrease spillage.  SLP observed him with his breakfast = including coffee, orange juice, and bacon =  Prolonged mastication noted with bacon but adequate oral clearance.  Occasional cough noted during breakfast, dry cough, did not appear due to aspiration and pt has baseline asthma.  Pt is afebrile and he is on room air and appears to be tolerating po diet.    Pt oriented to medical situation but not length of hospital coarse.  Provided verbal and written cues admission date and length of stay.  Pt retained information and requested SLP call his wife prior to leaving room. Left pt on the phone with his wife discussing length of hospital stay.  He reports wife lives at home with him and manages all of his home duties.  Pt using his compensation strategies with Mod I cues at end of session.      Recommend continue diet - SLP to sign off at this time.     HPI HPI: Frederick [name] is a 60 y.o. male with tobacco abuse, ETOH abuse (significantly > 80 oz beer per day, liquor per wife). Admitted with 1 week history of recurrent focal seizure activity. CT atrophy with small vessel chronic ischemic changes of deep cerebral white matter. 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 Required intubated 9/5-9/7. Other PMH: TBI x 2 (2001, 2016), asthma, cough.  Pt seen for follow up to assure po tolerance s/p dietary advancement.      SLP Plan  Continue with current plan of care      Recommendations for follow up therapy are one component of a  multi-disciplinary discharge planning process, led by the attending physician.  Recommendations may be updated based on patient status, additional functional criteria and insurance authorization.    Recommendations  Diet recommendations: Regular Medication Administration:  (as tolerated) Compensations: Slow rate;Small sips/bites Postural Changes and/or Swallow Maneuvers: Upright 30-60 min after meal;Seated upright 90 degrees                Oral Care Recommendations: Oral care BID Follow up Recommendations: None SLP Visit Diagnosis: Dysphagia, unspecified (R13.10) Plan: Continue with current plan of care       GO              Frederick Infante, MS [facility] SLP Acute Rehab Services Office [phone] Pager [phone]   Frederick [name]  08/25/2021, 8:46 AM

## 2021-08-25 NOTE — Progress Notes (Signed)
ANTICOAGULATION CONSULT NOTE - Follow Up Consult  Pharmacy Consult for Heparin Indication: VTE treatment  No Known Allergies  Patient Measurements: Height: 6\' 1"  (185.4 cm) Weight: 80 kg (176 lb 5.9 oz) IBW/kg (Calculated) : 79.9 Heparin Dosing Weight: 80 kg  Vital Signs: Temp: 98.3 F (36.8 C) (09/20 0750) Temp Source: Oral (09/20 0750) BP: 107/60 (09/20 0750) Pulse Rate: 98 (09/20 0750)  Labs: Recent Labs    08/23/21 0207 08/24/21 0532 08/25/21 0300  HGB 10.0*  --  10.5*  HCT 29.4*  --  30.6*  PLT 352  --  365  HEPARINUNFRC 0.51 0.64 0.62  CREATININE  --   --  0.82     Estimated Creatinine Clearance: 106.9 mL/min (by C-G formula based on SCr of 0.82 mg/dL).  Assessment:  60 yr old male continues on IV heparin for 9/13 10/13 doppler: acute superficial vein thrombosis involving the left cephalic vein and age indeterminate deep vein thrombosis involving the left subclavian vein.    Heparin level remains therapeutic (0.62) on 1850 units/hr.  CBC stable. No bleeding reported.  Goal of Therapy:  Heparin level 0.3-0.7 units/ml Monitor platelets by anticoagulation protocol: Yes   Plan:  Continue heparin drip at 1850 units/hr. Daily heparin level and CBC > added back for 9/20. Monitor for bleeding. Follow up for oral anticoagulation plans. Benefits copay check done by [facility] pharmacist on 9/16: his copay is $0 (zero) for both Eliquis and Xarelto.    10/16, RPh 08/25/2021,10:10 AM

## 2021-08-25 NOTE — Plan of Care (Signed)

## 2021-08-26 DIAGNOSIS — I5021 Acute systolic (congestive) heart failure: Secondary | ICD-10-CM | POA: Diagnosis not present

## 2021-08-26 LAB — BASIC METABOLIC PANEL
Anion gap: 8 (ref 5–15)
Anion gap: 7 (ref 5–15)
BUN: <5 mg/dL — ABNORMAL LOW (ref 8–23)
BUN: 5 mg/dL — ABNORMAL LOW (ref 8–23)
CO2: 22 mmol/L (ref 22–32)
CO2: 24 mmol/L (ref 22–32)
Calcium: 8.9 mg/dL (ref 8.9–10.3)
Calcium: 8.7 mg/dL — ABNORMAL LOW (ref 8.9–10.3)
Chloride: 102 mmol/L (ref 98–111)
Chloride: 100 mmol/L (ref 98–111)
Creatinine, Ser: 0.88 mg/dL (ref 0.61–1.24)
Creatinine, Ser: 0.86 mg/dL (ref 0.61–1.24)
GFR, Estimated: >60 mL/min (ref >60)
GFR, Estimated: >60 mL/min (ref >60)
Glucose, Bld: 97 mg/dL (ref 70–99)
Glucose, Bld: 106 mg/dL — ABNORMAL HIGH (ref 70–99)
Potassium: 4.2 mmol/L (ref 3.5–5.1)
Potassium: 4 mmol/L (ref 3.5–5.1)
Sodium: 132 mmol/L — ABNORMAL LOW (ref 135–145)
Sodium: 131 mmol/L — ABNORMAL LOW (ref 135–145)

## 2021-08-26 LAB — CBC
HCT: 33.5 % — ABNORMAL LOW (ref 39.0–52.0)
HCT: 31.8 % — ABNORMAL LOW (ref 39.0–52.0)
Hemoglobin: 11.4 g/dL — ABNORMAL LOW (ref 13.0–17.0)
Hemoglobin: 10.8 g/dL — ABNORMAL LOW (ref 13.0–17.0)
MCH: 31.6 pg (ref 26.0–34.0)
MCH: 31.5 pg (ref 26.0–34.0)
MCHC: 34 g/dL (ref 30.0–36.0)
MCHC: 34 g/dL (ref 30.0–36.0)
MCV: 92.8 fL (ref 80.0–100.0)
MCV: 92.7 fL (ref 80.0–100.0)
Platelets: 388 10*3/uL (ref 150–400)
Platelets: 366 10*3/uL (ref 150–400)
RBC: 3.61 MIL/uL — ABNORMAL LOW (ref 4.22–5.81)
RBC: 3.43 MIL/uL — ABNORMAL LOW (ref 4.22–5.81)
RDW: 14.5 % (ref 11.5–15.5)
RDW: 14.6 % (ref 11.5–15.5)
WBC: 9.6 10*3/uL (ref 4.0–10.5)
WBC: 9.6 10*3/uL (ref 4.0–10.5)
nRBC: 0 % (ref 0.0–0.2)
nRBC: 0 % (ref 0.0–0.2)

## 2021-08-26 LAB — GLUCOSE, CAPILLARY
Glucose-Capillary: 11 mg/dL — CL (ref 70–99)
Glucose-Capillary: 80 mg/dL (ref 70–99)
Glucose-Capillary: 111 mg/dL — ABNORMAL HIGH (ref 70–99)
Glucose-Capillary: 65 mg/dL — ABNORMAL LOW (ref 70–99)
Glucose-Capillary: 32 mg/dL — CL (ref 70–99)
Glucose-Capillary: 45 mg/dL — ABNORMAL LOW (ref 70–99)
Glucose-Capillary: 74 mg/dL (ref 70–99)
Glucose-Capillary: 97 mg/dL (ref 70–99)
Glucose-Capillary: 51 mg/dL — ABNORMAL LOW (ref 70–99)
Glucose-Capillary: 140 mg/dL — ABNORMAL HIGH (ref 70–99)

## 2021-08-26 LAB — MAGNESIUM
Magnesium: 2.3 mg/dL (ref 1.7–2.4)
Magnesium: 1.7 mg/dL (ref 1.7–2.4)

## 2021-08-26 LAB — HEPARIN LEVEL (UNFRACTIONATED)
Heparin Unfractionated: 0.8 IU/mL — ABNORMAL HIGH (ref 0.30–0.70)
Heparin Unfractionated: 0.73 IU/mL — ABNORMAL HIGH (ref 0.30–0.70)

## 2021-08-26 MED ORDER — SODIUM CHLORIDE 0.9 % IV SOLN: INTRAVENOUS | Status: DC

## 2021-08-26 MED ORDER — CHLORHEXIDINE GLUCONATE 0.12 % MT SOLN
OROMUCOSAL | Status: AC
  Filled 2021-08-26: qty 15

## 2021-08-26 MED ORDER — APIXABAN 5 MG PO TABS: 5.0000 mg | ORAL_TABLET | Freq: Two times a day (BID) | ORAL | Status: DC

## 2021-08-26 MED ORDER — APIXABAN 5 MG PO TABS
10.0000 mg | ORAL_TABLET | Freq: Two times a day (BID) | ORAL | Status: DC
  Administered 2021-08-26 – 2021-08-28 (×5): 10 mg via ORAL
  Filled 2021-08-26 (×5): qty 2

## 2021-08-26 MED ORDER — DEXTROSE 50 % IV SOLN
1.0000 | Freq: Once | INTRAVENOUS | Status: AC
  Administered 2021-08-26: 50 mL via INTRAVENOUS
  Filled 2021-08-26: qty 50

## 2021-08-26 MED ORDER — PANTOPRAZOLE SODIUM 40 MG IV SOLR
40.0000 mg | Freq: Two times a day (BID) | INTRAVENOUS | Status: DC
  Administered 2021-08-26 (×2): 40 mg via INTRAVENOUS
  Filled 2021-08-26 (×2): qty 40

## 2021-08-26 MED ORDER — SACCHAROMYCES BOULARDII 250 MG PO CAPS
250.0000 mg | ORAL_CAPSULE | Freq: Two times a day (BID) | ORAL | Status: DC
  Administered 2021-08-26 – 2021-08-28 (×5): 250 mg via ORAL
  Filled 2021-08-26 (×5): qty 1

## 2021-08-26 MED ORDER — DEXTROSE IN LACTATED RINGERS 5 % IV SOLN: INTRAVENOUS | Status: DC

## 2021-08-26 NOTE — Plan of Care (Signed)

## 2021-08-26 NOTE — Progress Notes (Signed)
ANTICOAGULATION CONSULT NOTE - Follow Up Consult  Pharmacy Consult for Heparin Indication: VTE treatment  No Known Allergies  Patient Measurements: Height: 6\' 1"  (185.4 cm) Weight: 80 kg (176 lb 5.9 oz) IBW/kg (Calculated) : 79.9 Heparin Dosing Weight: 80 kg  Vital Signs: Temp: 98.3 F (36.8 C) (09/21 0328) Temp Source: Oral (09/21 0328) BP: 117/79 (09/21 0328) Pulse Rate: 87 (09/21 0328)  Labs: Recent Labs    08/24/21 0532 08/25/21 0300 08/26/21 0347  HGB  --  10.5* 11.4*  HCT  --  30.6* 33.5*  PLT  --  365 388  HEPARINUNFRC 0.64 0.62 0.80*  CREATININE  --  0.82  --      Estimated Creatinine Clearance: 106.9 mL/min (by C-G formula based on SCr of 0.82 mg/dL).  Assessment:  60 yr old male continues on IV heparin for 9/13 10/13 doppler: acute superficial vein thrombosis involving the left cephalic vein and age indeterminate deep vein thrombosis involving the left subclavian vein.   Heparin level up to supratherapeutic (0.8) on gtt at 1850 units/hr. No bleeding noted.  Goal of Therapy:  Heparin level 0.3-0.7 units/ml Monitor platelets by anticoagulation protocol: Yes   Plan:  Decrease heparin drip to 1750 units/hr. F/u 6 hr heparin level  [place], PharmD, BCPS Please see amion for complete clinical pharmacist phone list 08/26/2021,4:53 AM

## 2021-08-26 NOTE — Progress Notes (Signed)
ANTICOAGULATION CONSULT NOTE - Follow Up Consult  Pharmacy Consult for Apixaban Indication: VTE treatment  No Known Allergies  Patient Measurements: Height: 6\' 1"  (185.4 cm) Weight: 68.8 kg (151 lb 10.8 oz) IBW/kg (Calculated) : 79.9  Vital Signs: Temp: 97.7 F (36.5 C) (09/21 0724) Temp Source: Oral (09/21 0724) BP: 131/87 (09/21 0724) Pulse Rate: 90 (09/21 0724)  Labs: Recent Labs    08/24/21 0532 08/25/21 0300 08/26/21 0347  HGB  --  10.5* 11.4*  HCT  --  30.6* 33.5*  PLT  --  365 388  HEPARINUNFRC 0.64 0.62 0.80*  CREATININE  --  0.82  --      Estimated Creatinine Clearance: 92.1 mL/min (by C-G formula based on SCr of 0.82 mg/dL).  Assessment: 60 yr old male continues on IV heparin for 9/13 10/13 doppler: acute superficial vein thrombosis involving the left cephalic vein and age indeterminate deep vein thrombosis involving the left subclavian vein.   Pharmacy has been consulted to switch IV heparin to apixaban.  Will start treatment dose apixaban for VTE.  Goal of Therapy:  Therapeutic Anticoagulation Monitor platelets by anticoagulation protocol: Yes   Plan:  Discontinue heparin and associtated labs. Start Apixaban 10mg  PO BID x 7 days, then reduce dose to 5mg  BID  [place], Pharm.D., BCPS Clinical Pharmacist  **Pharmacist phone directory can be found on [email or web address] listed under [facility].  08/26/2021 10:58 AM

## 2021-08-26 NOTE — Progress Notes (Signed)
Two voicemails have been left for patients significant other without a return call. TOC following.

## 2021-08-26 NOTE — Progress Notes (Signed)
OT Cancellation Note  Patient Details Name: Frederick [name] MRN: [id number] DOB: 1961-05-23   Cancelled Treatment:    Reason Eval/Treat Not Completed: Medical issues which prohibited therapy. RN requested OT hold for today due to pt having low blood sugar and being tachy. OT will follow up as pt is medically stable and schedule allows.   Dashay Giesler H., OTR/L Acute Rehabilitation  Alger Kerstein Elane Bing Plume 08/26/2021, 4:44 PM

## 2021-08-26 NOTE — Plan of Care (Signed)

## 2021-08-26 NOTE — Progress Notes (Addendum)
PROGRESS NOTE    Frederick [name]  MIW:[id number] DOB: December 26, 1960 DOA: 08/10/2021 PCP: [name]   Brief Narrative: 60 year old with past medical history significant for tobacco abuse, alcohol abuse, history of TB s/p treatment 2001 in 2016, presented with 1 week history of focal recurrent seizure activity.  EMS was called and patient was actively seizing with generalized tonic-clonic seizure.  He was brought to the ED require intubation for airway protection.  He was admitted to the ICU.  Neurology was consulted for status epilepticus in the setting of alcohol withdrawal.  Started on Keppra.  Hospitalization was complicated by aspiration pneumonia as well as Streptococcus Angionosis bacteremia.  TEE was negative for vegetation.  Echocardiogram on admission showed reduced ejection fraction 25 to 30% which improved to  45% on TEE.  He was a started on tube feedings by core track.  Patient continued to be encephalopathic.  ID was consulted for bacteremia.  Antibiotics coverage was changed from penicillin G to linezolid for a total duration of 2 weeks of therapy.  Last dose of antibiotics will be 9/21st/2022.  Patient will require outpatient CT chest in 8 weeks to monitor for B symptoms.  Patient passed a swallow evaluation and core track tube was removed.  Tube feeding stopped.  Patient was also found to have age-indeterminate DVT of the left subclavian/superficial vein thrombosis of the left cephalic vein.  He was a started on heparin drip.  He will be transition to Eliquis on 9/21st/2022.    Assessment & Plan:   Active Problems:   Cavitary lesion of lung   Seizures (HCC)   Status epilepticus (HCC)   Malnutrition of moderate degree   Bacteremia due to group B Streptococcus   Acute systolic CHF (congestive heart failure) (HCC)   Alcohol withdrawal seizure with complication, with unspecified complication ([place])   Dysphagia  1-Status Epilepticus: Resolved EEG 9/5 showed no epileptiform  discharges. Overnight EEG was suggestive of severe diffuse encephalopathy. Resolved.  Patient was a started on Keppra 1000 mg twice daily. Neurology has signed off, recommended to continue [place], can be weaned off in the next few month given provoked in the setting of alcohol withdrawal. Need to follow-up with neurology in 6 to 8 weeks after discharge  2-Streptococcus Angionosis Bacteremia:  Blood culture positive 9/5 streptococcus angionosis.   He was initially on penicillin G subsequently changed to linezolid for 2 weeks stop date 9/21st CT chest abdomen pelvis showed persistent cavitary changes in the right upper lobe.  No evidence of abscess or acute finding on CT maxilla facial face. ID recommended linezolid and repeat imaging in 6 to 8 weeks.  3-persistent cavitary changes with a right upper lobe -Prominence of bilateral nodular airspace disease concerning for chronic infectious/Mycobacterium or fungus. -History of pulmonary TB s/p treatment 2016 ID recommended yearly monitoring consider repeat CT chest in 6 to 8 weeks  Alcohol withdrawal: Was started on Ativan as needed CIWA protocol completed.  Acute metabolic encephalopathy: Received  high-dose IV thiamine for possible Warnicke encephalopathy. B12 normal, folate within normal limits.  RPR nonreactive. 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 Plan to transition to Eliquis 9/21st  Dysphagia: Severe aspiration risk, moderate protein caloric malnutrition. He was on core track tube feedings. Patient passes swallow evaluation and started on regular diet  Hypomagnesemia: Replaced  Diarrhea;  Start  florastore.  Denies abdominal pain.  Hopefully will improved after stopping antibiotics.  Nurse report black stool . It has been like that. Plan  to repeat CBC tonight. CBC this am was stable. Start IV protonix   Hypoglycemia; monitor cbg. Tube feeding removed.   Hyponatremia; start NS>  Cardiomyopathy, repeated TEE normal EF>    Nutrition Problem: Moderate Malnutrition Etiology: chronic illness (ETOH abuse)    Signs/Symptoms: mild fat depletion, mild muscle depletion, percent weight loss Percent weight loss: 10.7 %    Interventions: Prostat, Tube feeding  Estimated body mass index is 20.01 kg/m as calculated from the following:   Height as of this encounter: '6\' 1"'  (1.854 m).   Weight as of this encounter: 68.8 kg.   DVT prophylaxis: eliquis Code Status: Full code Family Communication: no family at bedside.  Disposition Plan:  Status is: Inpatient  Remains inpatient appropriate because:IV treatments appropriate due to intensity of illness or inability to take PO  Dispo: The patient is from: Home              Anticipated d/c is to: SNF              Patient currently is medically stable to d/c.   Difficult to place patient No        Consultants:  Neurology CCM  Procedures:  TEE Antimicrobials:  Penincillin-- linezolid.   Subjective: He is alert, calm, denies abdominal pain. Still with rectal tube.    Objective: Vitals:   08/25/21 2354 08/26/21 0328 08/26/21 0536 08/26/21 0724  BP: 114/64 117/79  131/87  Pulse: 92 87  90  Resp: '18 20  16  ' Temp: 97.6 F (36.4 C) 98.3 F (36.8 C)  97.7 F (36.5 C)  TempSrc: Oral Oral  Oral  SpO2: 100% (!) 81%  97%  Weight:   68.8 kg   Height:        Intake/Output Summary (Last 24 hours) at 08/26/2021 0958 Last data filed at 08/26/2021 0900 Gross per 24 hour  Intake 240 ml  Output 1250 ml  Net -1010 ml   Filed Weights   08/19/21 0237 08/22/21 0500 08/26/21 0536  Weight: 78.1 kg 80 kg 68.8 kg    Examination:  General exam: Appears calm and comfortable  Respiratory system: Clear to auscultation. Respiratory effort normal. Cardiovascular system:  S1 & S2 heard, RRR. No JVD, murmurs, rubs, gallops or clicks. No pedal edema. Gastrointestinal system: Abdomen is nondistended, soft and nontender. No organomegaly or masses felt. Normal bowel sounds heard. Central nervous system: Alert and oriented.  Extremities: Symmetric 5 x 5 power.     Data Reviewed: I have personally reviewed following labs and imaging studies  CBC: Recent Labs  Lab 08/21/21 0258 08/22/21 0202 08/23/21 0207 08/25/21 0300 08/26/21 0347  WBC 8.7 7.4 8.0 9.0 9.6  HGB 11.3* 10.9* 10.0* 10.5* 11.4*  HCT 33.9* 33.0* 29.4* 30.6* 33.5*  MCV 93.6 92.4 92.7 92.2 92.8  PLT 277 315 352 365 245   Basic Metabolic Panel: Recent Labs  Lab 08/20/21 0313 08/21/21 0258 08/25/21 0300 08/26/21 0347  NA 130* 130* 131*  --   K 4.1 4.5 4.0  --   CL 95* 94* 101  --   CO2 '24 26 22  ' --   GLUCOSE 125* 106* 97  --   BUN 9 9 6*  --   CREATININE 0.70 0.80 0.82  --   CALCIUM 8.8* 9.3 8.8*  --   MG  --  1.6* 1.3* 2.3  GFR: Estimated Creatinine Clearance: 92.1 mL/min (by C-G formula based on SCr of 0.82 mg/dL). Liver Function Tests: Recent Labs  Lab 08/25/21 0300  AST 22  ALT 16  ALKPHOS 56  BILITOT 0.3  PROT 6.5  ALBUMIN 2.0*   No results for input(s): LIPASE, AMYLASE in the last 168 hours. No results for input(s): AMMONIA in the last 168 hours. Coagulation Profile: No results for input(s): INR, PROTIME in the last 168 hours. Cardiac Enzymes: No results for input(s): CKTOTAL, CKMB, CKMBINDEX, TROPONINI in the last 168 hours. BNP (last 3 results) No results for input(s): PROBNP in the last 8760 hours. HbA1C: No results for input(s): HGBA1C in the last 72 hours. CBG: Recent Labs  Lab 08/25/21 0619 08/25/21 1145 08/25/21 1558 08/25/21 2133 08/26/21 0823  GLUCAP 103* 109* 94 82 80   Lipid Profile: No results for input(s): CHOL, HDL, LDLCALC, TRIG, CHOLHDL, LDLDIRECT in the last 72 hours. Thyroid Function Tests: No results for input(s): TSH, T4TOTAL,  FREET4, T3FREE, THYROIDAB in the last 72 hours. Anemia Panel: No results for input(s): VITAMINB12, FOLATE, FERRITIN, TIBC, IRON, RETICCTPCT in the last 72 hours. Sepsis Labs: No results for input(s): PROCALCITON, LATICACIDVEN in the last 168 hours.  No results found for this or any previous visit (from the past 240 hour(s)).       Radiology Studies: No results found.      Scheduled Meds:  carvedilol  6.25 mg Oral BID WC   chlorhexidine       chlorhexidine gluconate (MEDLINE KIT)  15 mL Mouth Rinse BID   Chlorhexidine Gluconate Cloth  6 each Topical Daily   feeding supplement  237 mL Oral BID BM   folic acid  1 mg Oral Daily   levETIRAcetam  1,000 mg Oral BID   linezolid  600 mg Oral Q12H   multivitamin with minerals  1 tablet Oral Daily   nicotine  21 mg Transdermal Daily   thiamine  100 mg Oral Daily   Continuous Infusions:  sodium chloride Stopped (08/17/21 1750)   sodium chloride Stopped (08/18/21 2240)   heparin 1,750 Units/hr (08/26/21 0458)     LOS: 16 days    Time spent: 35 miutes    Navid Lenzen A Burdette Forehand, MD [facility]   If 7PM-7AM, please contact night-coverage [email or web address]  08/26/2021, 9:58 AM

## 2021-08-27 DIAGNOSIS — G40901 Epilepsy, unspecified, not intractable, with status epilepticus: Secondary | ICD-10-CM | POA: Diagnosis not present

## 2021-08-27 LAB — MAGNESIUM: Magnesium: 1.6 mg/dL — ABNORMAL LOW (ref 1.7–2.4)

## 2021-08-27 LAB — BASIC METABOLIC PANEL
Anion gap: 5 (ref 5–15)
BUN: 8 mg/dL (ref 8–23)
CO2: 27 mmol/L (ref 22–32)
Calcium: 8.8 mg/dL — ABNORMAL LOW (ref 8.9–10.3)
Chloride: 99 mmol/L (ref 98–111)
Creatinine, Ser: 0.87 mg/dL (ref 0.61–1.24)
GFR, Estimated: >60 mL/min (ref >60)
Glucose, Bld: 107 mg/dL — ABNORMAL HIGH (ref 70–99)
Potassium: 4.9 mmol/L (ref 3.5–5.1)
Sodium: 131 mmol/L — ABNORMAL LOW (ref 135–145)

## 2021-08-27 LAB — GLUCOSE, CAPILLARY
Glucose-Capillary: 77 mg/dL (ref 70–99)
Glucose-Capillary: 134 mg/dL — ABNORMAL HIGH (ref 70–99)
Glucose-Capillary: 54 mg/dL — ABNORMAL LOW (ref 70–99)
Glucose-Capillary: 119 mg/dL — ABNORMAL HIGH (ref 70–99)

## 2021-08-27 LAB — CBC
HCT: 31.3 % — ABNORMAL LOW (ref 39.0–52.0)
Hemoglobin: 10.4 g/dL — ABNORMAL LOW (ref 13.0–17.0)
MCH: 31.1 pg (ref 26.0–34.0)
MCHC: 33.2 g/dL (ref 30.0–36.0)
MCV: 93.7 fL (ref 80.0–100.0)
Platelets: 359 10*3/uL (ref 150–400)
RBC: 3.34 MIL/uL — ABNORMAL LOW (ref 4.22–5.81)
RDW: 14.6 % (ref 11.5–15.5)
WBC: 10.1 10*3/uL (ref 4.0–10.5)
nRBC: 0 % (ref 0.0–0.2)

## 2021-08-27 LAB — SARS CORONAVIRUS 2 (TAT 6-24 HRS): SARS Coronavirus 2: NEGATIVE

## 2021-08-27 MED ORDER — PANTOPRAZOLE SODIUM 40 MG PO TBEC
40.0000 mg | DELAYED_RELEASE_TABLET | Freq: Two times a day (BID) | ORAL | Status: DC
  Administered 2021-08-27 – 2021-08-28 (×3): 40 mg via ORAL
  Filled 2021-08-27 (×3): qty 1

## 2021-08-27 MED ORDER — MAGNESIUM SULFATE 2 GM/50ML IV SOLN
2.0000 g | Freq: Once | INTRAVENOUS | Status: AC
  Administered 2021-08-27: 2 g via INTRAVENOUS
  Filled 2021-08-27: qty 50

## 2021-08-27 MED ORDER — ENSURE ENLIVE PO LIQD
237.0000 mL | Freq: Three times a day (TID) | ORAL | Status: DC
  Administered 2021-08-27 – 2021-08-28 (×4): 237 mL via ORAL

## 2021-08-27 MED ORDER — VITAMIN B-12 100 MCG PO TABS
100.0000 ug | ORAL_TABLET | Freq: Every day | ORAL | Status: DC
  Administered 2021-08-27 – 2021-08-28 (×2): 100 ug via ORAL
  Filled 2021-08-27 (×2): qty 1

## 2021-08-27 NOTE — Progress Notes (Signed)
Occupational Therapy Treatment Patient Details Name: JONI NORROD MRN: [id number] DOB: March 29, 1961 Today's Date: 08/27/2021   History of present illness Pt is a 60 year old man admitted on 08/10/21 with ETOH induced seizure. Intubated 08/10/21 to 08/12/21. Head CT negative for acute abnormality. Pt on CIWA precautions. 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 Cortrak placed 08/14/21. Left UE DVT found on 9/13, pt placed on heparin IV 12:30pm 9/13. 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   OT comments  Pt making good progress with OT goals. He was more attentive this session and aware of his situation. Pt agreeable to exercises and bed mobility for BM clean up. Pt trunk strength appears to be improving as he is able to sit up into long sitting and complete bridges in bed with no assist. SNF continues to be appropriate for pt d/c to maximize his return to independence and OT will continue to follow acutely.    Recommendations for follow up therapy are one component of a multi-disciplinary discharge planning process, led by the attending physician.  Recommendations may be updated based on patient status, additional functional criteria and insurance authorization.    Follow Up Recommendations  SNF;Supervision/Assistance - 24 hour    Equipment Recommendations  Other (comment) (TBD next venue)    Recommendations for Other Services      Precautions / Restrictions Precautions Precautions: Fall Precaution Comments: waxing/waning agitation, rectal tube (pt doffed prior to therapy session) Restrictions Weight Bearing Restrictions: No       Mobility Bed Mobility Overal bed mobility: Needs Assistance Bed Mobility: Rolling;Supine to Sit;Sit to Supine Rolling: Min guard   Supine to sit: Min assist;+2 for safety/equipment Sit to supine: Min assist;+2 for safety/equipment   General bed mobility  comments: Cues for sequencing and safety; minA for supine to long sit x3 reps while repositioning in bed with use of bed features/rail and pt able to minimally bridge hips with multimodal cues and increased timep; min/modA +2 for posterior supine scoot with overhead rails; rolling in bed each side x4 reps    Transfers                 General transfer comment: defer due to pt needing increased time for clean-up and peri-care and RN entering room to assess IV infiltration site    Balance Overall balance assessment: Needs assistance Sitting-balance support: Feet supported;No upper extremity supported Sitting balance-Leahy Scale: Fair Sitting balance - Comments: limited assessment due to peri-care needs, pt performed long sit but needing minA for safety due to impulsivity Postural control: Posterior lean                                 ADL either performed or assessed with clinical judgement   ADL Overall ADL's : Needs assistance/impaired Eating/Feeding: Modified independent;Sitting Eating/Feeding Details (indicate cue type and reason): Pt was eating lunch as OT/PT arrived Grooming: Minimal assistance;Cueing for sequencing;Sitting Grooming Details (indicate cue type and reason): requiring min A to initiate cleaning his hands and face     Lower Body Bathing: Maximal assistance;Cueing for sequencing;Bed level Lower Body Bathing Details (indicate cue type and reason): completed LB batghing in bed this session, requiring max A for thoroughness, however pt was able to assist with cleaning his peri area sitting up in bed Upper Body Dressing : Minimal assistance;Sitting Upper Body Dressing Details (indicate cue  type and reason): min assist to initiate donning hospital gown                   General ADL Comments: Limited to bed level due to pt requiring clean up from BM and flexiseal pulling out.     Vision       Perception     Praxis      Cognition  Arousal/Alertness: Awake/alert Behavior During Therapy: Flat affect Overall Cognitive Status: Impaired/Different from baseline Area of Impairment: Attention;Following commands;Safety/judgement;Problem solving;Memory;Orientation;Awareness                 Orientation Level: Disoriented to;Time Current Attention Level: Selective;Sustained Memory: Decreased short-term memory;Decreased recall of precautions Following Commands: Follows one step commands consistently;Follows multi-step commands inconsistently Safety/Judgement: Decreased awareness of safety;Decreased awareness of deficits Awareness: Emergent Problem Solving: Slow processing;Requires verbal cues;Requires tactile cues General Comments: Pt cooperative with goal-oriented tasks, able to follow simple 1-step cues ~50-75% of the time, does benefit from quiet environment and minimal distractions. Pt with mild ataxia and some noted RUE tremors seeming to increase with fatigue.        Exercises Exercises: Other exercises General Exercises - Upper Extremity Shoulder Flexion: AROM;Both;5 reps General Exercises - Lower Extremity Ankle Circles/Pumps:  (partial ROM on R) Heel Slides: AROM;Both;10 reps;Supine;AAROM Hip ABduction/ADduction: AROM;Both;Supine;5 reps Hip Flexion/Marching: AROM;Both;5 reps;Supine Other Exercises Other Exercises: Rolling L/R multiple reps Other Exercises: bridging hips with BLE stabilized x3 reps   Shoulder Instructions       General Comments VSS on RA, cleaned up andn linens changed after BM    Pertinent Vitals/ Pain       Pain Assessment: Faces Faces Pain Scale: Hurts little more Pain Location: R forearm near IV site, appears swollen/hard, RN called into room to assess Pain Descriptors / Indicators: Grimacing;Discomfort Pain Intervention(s): Limited activity within patient's tolerance;Monitored during session;Repositioned  Home Living                                           Prior Functioning/Environment              Frequency  Min 2X/week        Progress Toward Goals  OT Goals(current goals can now be found in the care plan section)  Progress towards OT goals: Progressing toward goals  Acute Rehab OT Goals Patient Stated Goal: to go home as soon as possible OT Goal Formulation: With patient Time For Goal Achievement: 08/29/21 Potential to Achieve Goals: Fair ADL Goals Pt Will Perform Eating: with mod assist;sitting Pt Will Perform Grooming: with mod assist;sitting Pt Will Transfer to Toilet: with +2 assist;with mod assist Additional ADL Goal #1: Pt will demonstrate fair sitting balance at EOB. Additional ADL Goal #2: Pt will follow one step commands with 50% accuracy. Additional ADL Goal #3: Pt will perform bed mobility with moderate assistance in preparation for ADL.  Plan Discharge plan remains appropriate;Frequency remains appropriate    Co-evaluation    PT/OT/SLP Co-Evaluation/Treatment: Yes Reason for Co-Treatment: Complexity of the patient's impairments (multi-system involvement) PT goals addressed during session: Mobility/safety with mobility;Balance;Strengthening/ROM OT goals addressed during session: ADL's and self-care;Strengthening/ROM      AM-PAC OT "6 Clicks" Daily Activity     Outcome Measure   Help from another person eating meals?: A Little Help from another person taking care of personal grooming?: A Little Help from another person toileting,  which includes using toliet, bedpan, or urinal?: A Lot Help from another person bathing (including washing, rinsing, drying)?: A Lot Help from another person to put on and taking off regular upper body clothing?: A Little Help from another person to put on and taking off regular lower body clothing?: Total 6 Click Score: 14    End of Session    OT Visit Diagnosis: Muscle weakness (generalized) (M62.81);Other symptoms and signs involving cognitive function   Activity  Tolerance Patient tolerated treatment well   Patient Left in bed;with call bell/phone within reach;with bed alarm set   Nurse Communication Mobility status;Other (comment) (IV infiltrated and Flexiseal dislodged)        Time: 9407-6808 OT Time Calculation (min): 33 min  Charges: OT General Charges $OT Visit: 1 Visit OT Treatments $Self Care/Home Management : 8-22 mins  Ringo Sherod H., OTR/L Acute Rehabilitation  [place] Qin Elane Bing Plume 08/27/2021, 4:17 PM

## 2021-08-27 NOTE — Discharge Instructions (Signed)
Information on my medicine - ELIQUIS (apixaban)  Why was Eliquis prescribed for you? Eliquis was prescribed to treat blood clots that may have been found in the veins of your legs (deep vein thrombosis) or in your lungs (pulmonary embolism) and to reduce the risk of them occurring again.  What do You need to know about Eliquis ? The starting dose is 10 mg (two 5 mg tablets) taken TWICE daily for the FIRST SEVEN (7) DAYS, then on  09/02/2021  the dose is reduced to ONE 5 mg tablet taken TWICE daily.  Eliquis may be taken with or without food.   Try to take the dose about the same time in the morning and in the evening. If you have difficulty swallowing the tablet whole please discuss with your pharmacist how to take the medication safely.  Take Eliquis exactly as prescribed and DO NOT stop taking Eliquis without talking to the doctor who prescribed the medication.  Stopping may increase your risk of developing a new blood clot.  Refill your prescription before you run out.  After discharge, you should have regular check-up appointments with your healthcare provider that is prescribing your Eliquis.    What do you do if you miss a dose? If a dose of ELIQUIS is not taken at the scheduled time, take it as soon as possible on the same day and twice-daily administration should be resumed. The dose should not be doubled to make up for a missed dose.  Important Safety Information A possible side effect of Eliquis is bleeding. You should call your healthcare provider right away if you experience any of the following: Bleeding from an injury or your nose that does not stop. Unusual colored urine (red or dark brown) or unusual colored stools (red or black). Unusual bruising for unknown reasons. A serious fall or if you hit your head (even if there is no bleeding).  Some medicines may interact with Eliquis and might increase your risk of bleeding or clotting while on Eliquis. To help avoid  this, consult your healthcare provider or pharmacist prior to using any new prescription or non-prescription medications, including herbals, vitamins, non-steroidal anti-inflammatory drugs (NSAIDs) and supplements.  This website has more information on Eliquis (apixaban): http://[email or web address].com/eliquis/home

## 2021-08-27 NOTE — Plan of Care (Signed)

## 2021-08-27 NOTE — Progress Notes (Signed)
Physical Therapy Treatment Patient Details Name: Frederick [name] MRN: [id number] DOB: Apr 26, 1961 Today's Date: 08/27/2021   History of Present Illness Pt is a 60 year old man admitted on 08/10/21 with ETOH induced seizure. Intubated 08/10/21 to 08/12/21. Head CT negative for acute abnormality. Pt on CIWA precautions. 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 Cortrak placed 08/14/21. Left UE DVT found on 9/13, pt placed on heparin IV 12:30pm 9/13. 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    PT Comments    Pt received in supine, agreeable to therapy session and with good participation for bed mobility and seated balance training. Pt limited due to need for peri-care/cleanup and IV site noted to be swollen/painful so RN called into room to assess. Pt performed bed mobility with min guard to minA and posterior supine scooting with min/modA. Pt benefits from multimodal cueing and increased time to follow simple commands, but with improving situational awareness today. Pt motivated to perform goal-oriented tasks and to get further therapy prior to going home. Pt continues to benefit from PT services to progress toward functional mobility goals. Continue to recommend SNF.   Recommendations for follow up therapy are one component of a multi-disciplinary discharge planning process, led by the attending physician.  Recommendations may be updated based on patient status, additional functional criteria and insurance authorization.  Follow Up Recommendations  SNF;Supervision/Assistance - 24 hour     Equipment Recommendations  None recommended by PT    Recommendations for Other Services       Precautions / Restrictions Precautions Precautions: Fall Precaution Comments: waxing/waning agitation, rectal tube (pt doffed prior to therapy session) Restrictions Weight Bearing Restrictions: No     Mobility  Bed  Mobility Overal bed mobility: Needs Assistance Bed Mobility: Rolling;Supine to Sit;Sit to Supine Rolling: Min guard   Supine to sit: Min assist;+2 for safety/equipment Sit to supine: Min assist;+2 for safety/equipment   General bed mobility comments: Cues for sequencing and safety; minA for supine to long sit x3 reps while repositioning in bed with use of bed features/rail and pt able to minimally bridge hips with multimodal cues and increased timep; min/modA +2 for posterior supine scoot with overhead rails; rolling in bed each side x4 reps    Transfers                 General transfer comment: defer due to pt needing increased time for clean-up and peri-care and RN entering room to assess IV infiltration site      Balance Overall balance assessment: Needs assistance Sitting-balance support: Feet supported;No upper extremity supported Sitting balance-Leahy Scale: Fair Sitting balance - Comments: limited assessment due to peri-care needs, pt performed long sit but needing minA for safety due to impulsivity Postural control: Posterior lean                             Cognition Arousal/Alertness: Awake/alert Behavior During Therapy: Flat affect Overall Cognitive Status: Impaired/Different from baseline Area of Impairment: Attention;Following commands;Safety/judgement;Problem solving;Memory;Orientation;Awareness        Orientation Level: Disoriented to;Time Current Attention Level: Selective;Sustained Memory: Decreased short-term memory;Decreased recall of precautions Following Commands: Follows one step commands consistently;Follows multi-step commands inconsistently Safety/Judgement: Decreased awareness of safety;Decreased awareness of deficits Awareness: Emergent Problem Solving: Slow processing;Requires verbal cues;Requires tactile cues General Comments: Pt cooperative with goal-oriented tasks, able to follow simple 1-step cues ~50-75% of the time,  does  benefit from quiet environment and minimal distractions. Pt with mild ataxia and some noted RUE tremors seeming to increase with fatigue.      Exercises General Exercises - Upper Extremity Shoulder Flexion: AROM;Both;5 reps General Exercises - Lower Extremity Ankle Circles/Pumps: AROM;Both;10 reps (partial ROM on R) Heel Slides: AROM;Both;10 reps;Supine;AAROM Hip ABduction/ADduction: AROM;Both;Supine;5 reps Hip Flexion/Marching: AROM;Both;5 reps;Supine Other Exercises Other Exercises: Rolling L/R multiple reps Other Exercises: bridging hips with BLE stabilized x3 reps        Pertinent Vitals/Pain Pain Assessment: Faces Faces Pain Scale: Hurts little more Pain Location: R forearm near IV site, appears swollen/hard, RN called into room to assess Pain Descriptors / Indicators: Grimacing;Discomfort Pain Intervention(s): Limited activity within patient's tolerance;Monitored during session;Repositioned     PT Goals (current goals can now be found in the care plan section) Acute Rehab PT Goals Patient Stated Goal: to go home as soon as possible PT Goal Formulation: Patient unable to participate in goal setting Time For Goal Achievement: 08/29/21 Potential to Achieve Goals: Fair Progress towards PT goals: Progressing toward goals    Frequency    Min 2X/week      PT Plan Current plan remains appropriate    Co-evaluation PT/OT/SLP Co-Evaluation/Treatment: Yes Reason for Co-Treatment: Complexity of the patient's impairments (multi-system involvement);Necessary to address cognition/behavior during functional activity;For patient/therapist safety;To address functional/ADL transfers PT goals addressed during session: Mobility/safety with mobility;Balance;Strengthening/ROM        AM-PAC PT "6 Clicks" Mobility   Outcome Measure  Help needed turning from your back to your side while in a flat bed without using bedrails?: A Little Help needed moving from lying on your back to  sitting on the side of a flat bed without using bedrails?: A Little Help needed moving to and from a bed to a chair (including a wheelchair)?: Total Help needed standing up from a chair using your arms (e.g., wheelchair or bedside chair)?: Total Help needed to walk in hospital room?: Total Help needed climbing 3-5 steps with a railing? : Total 6 Click Score: 10    End of Session   Activity Tolerance: Patient tolerated treatment well;Other (comment);Treatment limited secondary to medical complications (Comment) (limited due to need for peri-care and IV infiltration observed, RN entering room to assess) Patient left: in bed;with call bell/phone within reach;with bed alarm set;with nursing/sitter in room Nurse Communication: Mobility status;Other (comment) (IV site pain, pt flexi-seal noted to be out of place when therapists entering room) PT Visit Diagnosis: Muscle weakness (generalized) (M62.81);Unsteadiness on feet (R26.81)     Time: 0102-7253 PT Time Calculation (min) (ACUTE ONLY): 33 min  Charges:  $Therapeutic Exercise: 8-22 mins                     Ellesse Antenucci P., PTA Acute Rehabilitation Services Pager: [phone] Office: [phone]    [place]us [place] 08/27/2021, 3:52 PM

## 2021-08-27 NOTE — Progress Notes (Addendum)
PROGRESS NOTE    Frederick [name]  VHQ:[id number] DOB: Jul 24, 1961 DOA: 08/10/2021 PCP: [name]   Brief Narrative: 60 year old with past medical history significant for tobacco abuse, alcohol abuse, history of TB s/p treatment 2001 in 2016, presented with 1 week history of focal recurrent seizure activity.  EMS was called and patient was actively seizing with generalized tonic-clonic seizure.  He was brought to the ED require intubation for airway protection.  He was admitted to the ICU.  Neurology was consulted for status epilepticus in the setting of alcohol withdrawal.  Started on Keppra.  Hospitalization was complicated by aspiration pneumonia as well as Streptococcus Angionosis bacteremia.  TEE was negative for vegetation.  Echocardiogram on admission showed reduced ejection fraction 25 to 30% which improved to  45% on TEE.  He was a started on tube feedings by core track.  Patient continued to be encephalopathic.  ID was consulted for bacteremia.  Antibiotics coverage was changed from penicillin G to linezolid for a total duration of 2 weeks of therapy.  Last dose of antibiotics will be 9/21st/2022.  Patient will require outpatient CT chest in 8 weeks to monitor for B symptoms.  Patient passed a swallow evaluation and core track tube was removed.  Tube feeding stopped.  Patient was also found to have age-indeterminate DVT of the left subclavian/superficial vein thrombosis of the left cephalic vein.  He was a started on heparin drip.  He will be transition to Eliquis on 9/21st/2022.    Assessment & Plan:   Active Problems:   Cavitary lesion of lung   Seizures (HCC)   Status epilepticus (HCC)   Malnutrition of moderate degree   Bacteremia due to group B Streptococcus   Acute systolic CHF (congestive heart failure) (HCC)   Alcohol withdrawal seizure with complication, with unspecified complication ([place])   Dysphagia  1-Status Epilepticus: Resolved EEG 9/5 showed no epileptiform  discharges. Overnight EEG was suggestive of severe diffuse encephalopathy. Resolved.  Patient was a started on Keppra 1000 mg twice daily. Neurology has signed off, recommended to continue [place], can be weaned off in the next few month given provoked in the setting of alcohol withdrawal. Need to follow-up with neurology in 6 to 8 weeks after discharge No further seizure.   2-Streptococcus Angionosis Bacteremia:  Blood culture positive 9/5 streptococcus angionosis.   He was initially on penicillin G subsequently changed to linezolid for 2 weeks stop date 9/21st CT chest abdomen pelvis showed persistent cavitary changes in the right upper lobe.  No evidence of abscess or acute finding on CT maxilla facial face. ID recommended linezolid and repeat imaging in 6 to 8 weeks. Completed antibiotics 9/21.  3-persistent cavitary changes with a right upper lobe -Prominence of bilateral nodular airspace disease concerning for chronic infectious/Mycobacterium or fungus. -History of pulmonary TB s/p treatment 2016 ID recommended yearly monitoring consider repeat CT chest in 6 to 8 weeks  Alcohol withdrawal: Was started on Ativan as needed CIWA protocol completed.  Acute metabolic encephalopathy: Received  high-dose IV thiamine for possible Warnicke encephalopathy. B12 normal, folate within normal limits.  RPR nonreactive. 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 Transition to Eliquis 9/21st  Dysphagia: Severe aspiration risk, moderate protein caloric malnutrition. He was on core track tube feedings. Patient passes swallow evaluation and started on regular diet  Hypomagnesemia:  Replaced IV  Diarrhea;  Started florastore.  Denies abdominal pain.  Hopefully will improved after stopping antibiotics.  Hb  stable.   Hypoglycemia; monitor cbg. Tube feeding removed. Continue with D 5 IV fluids. Encourage oral intake.   Hyponatremia; started  NS>  Cardiomyopathy, repeated TEE normal EF>    Nutrition Problem: Moderate Malnutrition Etiology: chronic illness (ETOH abuse)    Signs/Symptoms: mild fat depletion, mild muscle depletion, percent weight loss Percent weight loss: 10.7 %    Interventions: Prostat, Tube feeding  Estimated body mass index is 21.93 kg/m as calculated from the following:   Height as of this encounter: _0  (1.854 m).   Weight as of this encounter: 75.4 kg.   DVT prophylaxis: eliquis Code Status: Full code Family Communication: no family at bedside.  Disposition Plan:  Status is: Inpatient  Remains inpatient appropriate because:IV treatments appropriate due to intensity of illness or inability to take PO  Dispo: The patient is from: Home              Anticipated d/c is to: SNF, Awaiting improvement of Diarrhea and hypoglycemia               Patient currently is medically stable to d/c.   Difficult to place patient No        Consultants:  Neurology CCM  Procedures:  TEE Antimicrobials:  Penincillin-- linezolid.   Subjective: He is alert, feel ok. Denies abdominal pain, still has rectal tube for diarrhea.  Didn't eat a lot yesterday. I encourage him to eat.   Objective: Vitals:   08/26/21 2340 08/27/21 0115 08/27/21 0513 08/27/21 0758  BP: 115/64   126/64  Pulse: (!) 101   97  Resp: 17   18  Temp: 98 F (36.7 C)   97.7 F (36.5 C)  TempSrc: Oral   Oral  SpO2: (!) 87% 98%    Weight:   75.4 kg   Height:        Intake/Output Summary (Last 24 hours) at 08/27/2021 1242 Last data filed at 08/27/2021 1000 Gross per 24 hour  Intake 240 ml  Output 1250 ml  Net -1010 ml    Filed Weights   08/22/21 0500 08/26/21 0536 08/27/21 0513  Weight: 80 kg 68.8 kg 75.4 kg    Examination:  General exam: NAD Respiratory system:  CTA Cardiovascular system: S 1, S 2 RRR. Gastrointestinal system: BS present, soft, nt Central nervous system: alert, follows command Extremities: Symmetric power     Data Reviewed: I have personally reviewed following labs and imaging studies  CBC: Recent Labs  Lab 08/23/21 0207 08/25/21 0300 08/26/21 0347 08/26/21 1942 08/27/21 0356  WBC 8.0 9.0 9.6 9.6 10.1  HGB 10.0* 10.5* 11.4* 10.8* 10.4*  HCT 29.4* 30.6* 33.5* 31.8* 31.3*  MCV 92.7 92.2 92.8 92.7 93.7  PLT 352 365 388 366 462    Basic Metabolic Panel: Recent Labs  Lab 08/21/21 0258 08/25/21 0300 08/26/21 0347 08/26/21 1025 08/26/21 1942 08/27/21 0356  NA 130* 131*  --  132* 131* 131*  K 4.5 4.0  --  4.2 4.0 4.9  CL 94* 101  --  102 100 99  CO2 26 22  --  _1 GLUCOSE 106* 97  --  97 106* 107*  BUN 9 6*  --  <5* 5* 8  CREATININE 0.80 0.82  --  0.88 0.86 0.87  CALCIUM 9.3 8.8*  --  8.9 8.7* 8.8*  MG 1.6* 1.3* 2.3  --  1.7 1.6*    GFR: Estimated Creatinine Clearance: 95.1 mL/min (by C-G formula based on SCr of 0.87 mg/dL). Liver Function Tests: Recent Labs  Lab 08/25/21 0300  AST 22  ALT 16  ALKPHOS 56  BILITOT 0.3  PROT 6.5  ALBUMIN 2.0*    No results for input(s): LIPASE, AMYLASE in the last 168 hours. No results for input(s): AMMONIA in the last 168 hours. Coagulation Profile: No results for input(s): INR, PROTIME in the last 168 hours. Cardiac Enzymes: No results for input(s): CKTOTAL, CKMB, CKMBINDEX, TROPONINI in the last 168 hours. BNP (last 3 results) No results for input(s): PROBNP in the last 8760 hours. HbA1C: No results for input(s): HGBA1C in the last 72 hours. CBG: Recent Labs  Lab 08/26/21 2107 08/26/21 2214 08/27/21 0615 08/27/21 1110 08/27/21 1111  GLUCAP 51* 140* 77 54* 134*    Lipid Profile: No results for input(s): CHOL, HDL, LDLCALC, TRIG, CHOLHDL, LDLDIRECT in the last 72 hours. Thyroid Function Tests: No results for input(s): TSH, T4TOTAL, FREET4,  T3FREE, THYROIDAB in the last 72 hours. Anemia Panel: No results for input(s): VITAMINB12, FOLATE, FERRITIN, TIBC, IRON, RETICCTPCT in the last 72 hours. Sepsis Labs: No results for input(s): PROCALCITON, LATICACIDVEN in the last 168 hours.  No results found for this or any previous visit (from the past 240 hour(s)).       Radiology Studies: No results found.      Scheduled Meds:  apixaban  10 mg Oral BID   Followed by   Derrill Memo ON 09/02/2021] apixaban  5 mg Oral BID   carvedilol  6.25 mg Oral BID WC   chlorhexidine gluconate (MEDLINE KIT)  15 mL Mouth Rinse BID   Chlorhexidine Gluconate Cloth  6 each Topical Daily   feeding supplement  237 mL Oral TID BM   folic acid  1 mg Oral Daily   levETIRAcetam  1,000 mg Oral BID   multivitamin with minerals  1 tablet Oral Daily   nicotine  21 mg Transdermal Daily   pantoprazole  40 mg Oral BID   saccharomyces boulardii  250 mg Oral BID   thiamine  100 mg Oral Daily   Continuous Infusions:  sodium chloride Stopped (08/17/21 1750)   sodium chloride Stopped (08/18/21 2240)   dextrose 5% lactated ringers 75 mL/hr at 08/27/21 0748   magnesium sulfate bolus IVPB       LOS: 17 days    Time spent: 35 miutes    Murna Backer A Paraskevi Funez, MD [facility]   If 7PM-7AM, please contact night-coverage [email or web address]  08/27/2021, 12:42 PM

## 2021-08-27 NOTE — TOC Progression Note (Signed)
Transition of Care [facility]) - Progression Note    Patient Details  Name: Frederick [name] MRN: [id number] Date of Birth: 07/20/1961  [facility] CM/SW Contact  Kermit Balo, RN Phone Number: 08/27/2021, 2:57 PM  Clinical Narrative:    Asked MOA to start insurance auth for SNF rehab at [facility]. Need updated PT/OT notes. CM has updated the therapies.  Covid ordered for potential admission tomorrow. TOC following.   Expected Discharge Plan: Skilled Nursing Facility Barriers to Discharge: Continued Medical Work up  Expected Discharge Plan and Services Expected Discharge Plan: Skilled Nursing Facility   Discharge Planning Services: CM Consult   Living arrangements for the past 2 months: Single Family Home                                       Social Determinants of Health (SDOH) Interventions    Readmission Risk Interventions No flowsheet data found.

## 2021-08-28 DIAGNOSIS — R410 Disorientation, unspecified: Secondary | ICD-10-CM | POA: Diagnosis not present

## 2021-08-28 DIAGNOSIS — R1312 Dysphagia, oropharyngeal phase: Secondary | ICD-10-CM | POA: Diagnosis not present

## 2021-08-28 DIAGNOSIS — I509 Heart failure, unspecified: Secondary | ICD-10-CM | POA: Diagnosis not present

## 2021-08-28 DIAGNOSIS — R1310 Dysphagia, unspecified: Secondary | ICD-10-CM | POA: Diagnosis not present

## 2021-08-28 DIAGNOSIS — R569 Unspecified convulsions: Secondary | ICD-10-CM | POA: Diagnosis not present

## 2021-08-28 DIAGNOSIS — Z743 Need for continuous supervision: Secondary | ICD-10-CM | POA: Diagnosis not present

## 2021-08-28 DIAGNOSIS — R404 Transient alteration of awareness: Secondary | ICD-10-CM | POA: Diagnosis not present

## 2021-08-28 DIAGNOSIS — R5381 Other malaise: Secondary | ICD-10-CM | POA: Diagnosis not present

## 2021-08-28 DIAGNOSIS — J690 Pneumonitis due to inhalation of food and vomit: Secondary | ICD-10-CM | POA: Diagnosis not present

## 2021-08-28 DIAGNOSIS — R262 Difficulty in walking, not elsewhere classified: Secondary | ICD-10-CM | POA: Diagnosis not present

## 2021-08-28 DIAGNOSIS — Z23 Encounter for immunization: Secondary | ICD-10-CM | POA: Diagnosis not present

## 2021-08-28 DIAGNOSIS — I10 Essential (primary) hypertension: Secondary | ICD-10-CM | POA: Diagnosis not present

## 2021-08-28 DIAGNOSIS — G40901 Epilepsy, unspecified, not intractable, with status epilepticus: Secondary | ICD-10-CM | POA: Diagnosis not present

## 2021-08-28 DIAGNOSIS — I5021 Acute systolic (congestive) heart failure: Secondary | ICD-10-CM | POA: Diagnosis not present

## 2021-08-28 DIAGNOSIS — E440 Moderate protein-calorie malnutrition: Secondary | ICD-10-CM | POA: Diagnosis not present

## 2021-08-28 DIAGNOSIS — R2689 Other abnormalities of gait and mobility: Secondary | ICD-10-CM | POA: Diagnosis not present

## 2021-08-28 DIAGNOSIS — E162 Hypoglycemia, unspecified: Secondary | ICD-10-CM | POA: Diagnosis not present

## 2021-08-28 DIAGNOSIS — M6281 Muscle weakness (generalized): Secondary | ICD-10-CM | POA: Diagnosis not present

## 2021-08-28 LAB — CBC
HCT: 32.3 % — ABNORMAL LOW (ref 39.0–52.0)
Hemoglobin: 10.8 g/dL — ABNORMAL LOW (ref 13.0–17.0)
MCH: 31.3 pg (ref 26.0–34.0)
MCHC: 33.4 g/dL (ref 30.0–36.0)
MCV: 93.6 fL (ref 80.0–100.0)
Platelets: 381 10*3/uL (ref 150–400)
RBC: 3.45 MIL/uL — ABNORMAL LOW (ref 4.22–5.81)
RDW: 14.9 % (ref 11.5–15.5)
WBC: 9.8 10*3/uL (ref 4.0–10.5)
nRBC: 0 % (ref 0.0–0.2)

## 2021-08-28 LAB — GLUCOSE, CAPILLARY
Glucose-Capillary: 125 mg/dL — ABNORMAL HIGH (ref 70–99)
Glucose-Capillary: 106 mg/dL — ABNORMAL HIGH (ref 70–99)

## 2021-08-28 LAB — BASIC METABOLIC PANEL
Anion gap: 7 (ref 5–15)
BUN: 5 mg/dL — ABNORMAL LOW (ref 8–23)
CO2: 25 mmol/L (ref 22–32)
Calcium: 8.7 mg/dL — ABNORMAL LOW (ref 8.9–10.3)
Chloride: 101 mmol/L (ref 98–111)
Creatinine, Ser: 0.84 mg/dL (ref 0.61–1.24)
GFR, Estimated: >60 mL/min (ref >60)
Glucose, Bld: 105 mg/dL — ABNORMAL HIGH (ref 70–99)
Potassium: 4.2 mmol/L (ref 3.5–5.1)
Sodium: 133 mmol/L — ABNORMAL LOW (ref 135–145)

## 2021-08-28 LAB — MAGNESIUM: Magnesium: 1.7 mg/dL (ref 1.7–2.4)

## 2021-08-28 MED ORDER — SACCHAROMYCES BOULARDII 250 MG PO CAPS: 250.0000 mg | ORAL_CAPSULE | Freq: Two times a day (BID) | ORAL | 0 refills | Status: DC

## 2021-08-28 MED ORDER — LEVETIRACETAM 1000 MG PO TABS
1000.0000 mg | ORAL_TABLET | Freq: Two times a day (BID) | ORAL | 5 refills | Status: DC
Start: 2021-08-28 — End: 2022-04-12

## 2021-08-28 MED ORDER — NICOTINE 21 MG/24HR TD PT24: 21.0000 mg | MEDICATED_PATCH | Freq: Every day | TRANSDERMAL | 0 refills | Status: DC

## 2021-08-28 MED ORDER — MAGNESIUM SULFATE 2 GM/50ML IV SOLN
2.0000 g | Freq: Once | INTRAVENOUS | Status: AC
  Administered 2021-08-28: 2 g via INTRAVENOUS
  Filled 2021-08-28: qty 50

## 2021-08-28 MED ORDER — ADULT MULTIVITAMIN W/MINERALS CH: 1.0000 | ORAL_TABLET | Freq: Every day | ORAL | 0 refills | Status: DC

## 2021-08-28 MED ORDER — CYANOCOBALAMIN 100 MCG PO TABS
100.0000 ug | ORAL_TABLET | Freq: Every day | ORAL | 3 refills | Status: DC
Start: 2021-08-29 — End: 2022-01-22

## 2021-08-28 MED ORDER — FOLIC ACID 1 MG PO TABS: 1.0000 mg | ORAL_TABLET | Freq: Every day | ORAL | 3 refills | Status: DC

## 2021-08-28 MED ORDER — MAGNESIUM OXIDE -MG SUPPLEMENT 400 (240 MG) MG PO TABS
200.0000 mg | ORAL_TABLET | Freq: Two times a day (BID) | ORAL | 0 refills | Status: DC
Start: 2021-08-28 — End: 2022-04-12

## 2021-08-28 MED ORDER — IPRATROPIUM-ALBUTEROL 0.5-2.5 (3) MG/3ML IN SOLN: 3.0000 mL | Freq: Four times a day (QID) | RESPIRATORY_TRACT | 1 refills | Status: DC | PRN

## 2021-08-28 MED ORDER — ENSURE ENLIVE PO LIQD: 237.0000 mL | Freq: Three times a day (TID) | ORAL | 12 refills | Status: DC

## 2021-08-28 MED ORDER — THIAMINE HCL 100 MG PO TABS: 100.0000 mg | ORAL_TABLET | Freq: Every day | ORAL | 3 refills | Status: DC

## 2021-08-28 MED ORDER — APIXABAN 5 MG PO TABS: 5.0000 mg | ORAL_TABLET | Freq: Two times a day (BID) | ORAL | 3 refills | Status: DC

## 2021-08-28 MED ORDER — MAGNESIUM OXIDE -MG SUPPLEMENT 400 (240 MG) MG PO TABS
200.0000 mg | ORAL_TABLET | Freq: Two times a day (BID) | ORAL | Status: DC
  Administered 2021-08-28: 200 mg via ORAL
  Filled 2021-08-28: qty 1

## 2021-08-28 MED ORDER — CARVEDILOL 6.25 MG PO TABS: 6.2500 mg | ORAL_TABLET | Freq: Two times a day (BID) | ORAL | 3 refills | Status: DC

## 2021-08-28 MED ORDER — APIXABAN 5 MG PO TABS: 10.0000 mg | ORAL_TABLET | Freq: Two times a day (BID) | ORAL | 0 refills | Status: DC

## 2021-08-28 NOTE — Discharge Summary (Signed)
Physician Discharge Summary  Frederick [name] ZOX:[id number] DOB: 31-Dec-1960 DOA: 08/10/2021  PCP: Gean Birchwood, [facility]  Admit date: 08/10/2021 Discharge date: 08/28/2021  Admitted From: Home  Disposition:  SNF  Recommendations for Outpatient Follow-up:  Follow up with PCP in 1-2 weeks Please obtain BMP/CBC in one week Encourage oral intake.  Patient will need Snacks in between meals. Also continue with ensure.  Monitor CBG Needs follow up with ID (Dr Renold Don) in 4 weeks. Needs follow up CT chest in 4 weeks.  Follow Mg level.  Needs to follow up with neurology due to new Diagnosis of seizure.   Discharge Condition: Stable.  CODE STATUS:Full code Diet recommendation: Regular   Brief/Interim Summary: 60 year old with past medical history significant for tobacco abuse, alcohol abuse, history of TB s/p treatment 2001 in 2016, presented with 1 week history of focal recurrent seizure activity.  EMS was called and patient was actively seizing with generalized tonic-clonic seizure.  He was brought to the ED require intubation for airway protection.  He was admitted to the ICU.  Neurology was consulted for status epilepticus in the setting of alcohol withdrawal.  Started on Keppra.  Hospitalization was complicated by aspiration pneumonia as well as Streptococcus Angionosis bacteremia.  TEE was negative for vegetation.  Echocardiogram on admission showed reduced ejection fraction 25 to 30% which improved to  45% on TEE.  He was a started on tube feedings by core track.  Patient continued to be encephalopathic.  ID was consulted for bacteremia.  Antibiotics coverage was changed from penicillin G to linezolid for a total duration of 2 weeks of therapy.  Last dose of antibiotics will be 9/21st/2022.  Patient will require outpatient CT chest in 8 weeks to monitor for B symptoms.  Patient passed a swallow evaluation and core track tube was removed.  Tube feeding stopped.   Patient was also found to have age-indeterminate  DVT of the left subclavian/superficial vein thrombosis of the left cephalic vein.  He was a started on heparin drip.  He will be transition to Eliquis on 9/21st/2022.    1-Status Epilepticus: Resolved EEG 9/5 showed no epileptiform discharges. Overnight EEG was suggestive of severe diffuse encephalopathy. Resolved.  Patient was a started on Keppra 1000 mg twice daily. Neurology has signed off, recommended to continue Keppra, can be weaned off in the next few month given provoked in the setting of alcohol withdrawal. Need to follow-up with neurology in 6 to 8 weeks after discharge No further seizure.    2-Streptococcus Angionosis Bacteremia:  Blood culture positive 9/5 streptococcus angionosis.   He was initially on penicillin G subsequently changed to linezolid for 2 weeks stop date 9/21st CT chest abdomen pelvis showed persistent cavitary changes in the right upper lobe.  No evidence of abscess or acute finding on CT maxilla facial face. ID recommended linezolid 2 weeks and repeat imaging in 6 to 8 weeks. Completed antibiotics 9/21.   3-persistent cavitary changes with a right upper lobe -Prominence of bilateral nodular airspace disease concerning for chronic infectious/Mycobacterium or fungus. -History of pulmonary TB s/p treatment 2016 ID recommended yearly monitoring consider repeat CT chest in 6 to 8 weeks   Alcohol withdrawal: Was started on Ativan as needed CIWA protocol completed.   Acute metabolic encephalopathy: Received  high-dose IV thiamine for possible Warnicke encephalopathy. B12 normal, folate within normal limits.  RPR nonreactive. Improved History of stroke: 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 internal capsule   Age Indeterminate DVT of the left  subclavian/superficial vein thrombosis of the left cephalic vein He was a started on heparin drip. Transition to Eliquis 9/21st   Dysphagia: Severe aspiration risk, moderate protein caloric malnutrition. He  was on core track tube feedings. Patient passes swallow evaluation and started on regular diet   Hypomagnesemia: Replaced IV and will provide oral supplement.    Diarrhea; improved Started florastore.  Denies abdominal pain.  Hopefully will improved after stopping antibiotics.  Hb stable.    Hypoglycemia; monitor cbg. Tube feeding removed. Received  with D 5 IV fluids. Encourage oral intake. CBG improved. Needs snacks between meals. Continue to monitor CBG   Hyponatremia; started  NS>  Cardiomyopathy, repeated TEE normal EF>    Care discussed with Wife.     Nutrition Problem: Moderate Malnutrition Etiology: chronic illness (ETOH abuse)        Discharge Diagnoses:  Active Problems:   Cavitary lesion of lung   Seizures (HCC)   Status epilepticus (HCC)   Malnutrition of moderate degree   Bacteremia due to group B Streptococcus   Acute systolic CHF (congestive heart failure) (HCC)   Alcohol withdrawal seizure with complication, with unspecified complication [facility])   Dysphagia    Discharge Instructions  Discharge Instructions     Ambulatory referral to Infectious Disease   Complete by: As directed    Dr Alisia Ferrari ID   Ambulatory referral to Neurology   Complete by: As directed    An appointment is requested in approximately: 4 weeks   Diet - low sodium heart healthy   Complete by: As directed    Increase activity slowly   Complete by: As directed       Allergies as of 08/28/2021   No Known Allergies      Medication List     STOP taking these medications    chlordiazePOXIDE 25 MG capsule Commonly known as: LIBRIUM   doxycycline 100 MG capsule Commonly known as: VIBRAMYCIN   fluticasone 50 MCG/ACT nasal spray Commonly known as: FLONASE   gentamicin cream 0.1 % Commonly known as: GARAMYCIN   HYDROcodone-acetaminophen 5-325 MG tablet Commonly known as: NORCO/VICODIN   Naftifine HCl 2 % Gel   OXYGEN   triamcinolone cream 0.1 % Commonly  known as: KENALOG       TAKE these medications    albuterol 108 (90 Base) MCG/ACT inhaler Commonly known as: VENTOLIN HFA Inhale 2 puffs into the lungs every 4 (four) hours as needed.   apixaban 5 MG Tabs tablet Commonly known as: ELIQUIS Take 2 tablets (10 mg total) by mouth 2 (two) times daily for 5 days.   apixaban 5 MG Tabs tablet Commonly known as: ELIQUIS Take 1 tablet (5 mg total) by mouth 2 (two) times daily. Start taking on: September 02, 2021   carvedilol 6.25 MG tablet Commonly known as: COREG Take 1 tablet (6.25 mg total) by mouth 2 (two) times daily with a meal.   cyanocobalamin 100 MCG tablet Take 1 tablet (100 mcg total) by mouth daily. Start taking on: August 29, 2021   feeding supplement Liqd Take 237 mLs by mouth 3 (three) times daily between meals.   folic acid 1 MG tablet Commonly known as: FOLVITE Take 1 tablet (1 mg total) by mouth daily. Start taking on: August 29, 2021   ipratropium-albuterol 0.5-2.5 (3) MG/3ML Soln Commonly known as: DUONEB Take 3 mLs by nebulization every 6 (six) hours as needed.   levETIRAcetam 1000 MG tablet Commonly known as: KEPPRA Take 1 tablet (1,000 mg total)  by mouth 2 (two) times daily.   magnesium oxide 400 (240 Mg) MG tablet Commonly known as: MAG-OX Take 0.5 tablets (200 mg total) by mouth 2 (two) times daily.   multivitamin with minerals Tabs tablet Take 1 tablet by mouth daily.   nicotine 21 mg/24hr patch Commonly known as: NICODERM CQ - dosed in mg/24 hours Place 1 patch (21 mg total) onto the skin daily. Start taking on: August 29, 2021   saccharomyces boulardii 250 MG capsule Commonly known as: FLORASTOR Take 1 capsule (250 mg total) by mouth 2 (two) times daily.   thiamine 100 MG tablet Take 1 tablet (100 mg total) by mouth daily. Start taking on: August 29, 2021        Follow-up Information     Vu, Trung T, MD Follow up in 4 week(s).   Specialty: Infectious Diseases Contact  information: [address] [place] [address] [phone]                No Known Allergies  Consultations: Neurology CCM   Procedures/Studies: CT HEAD WO CONTRAST ( )  Result Date: 08/10/2021 CLINICAL DATA:  Seizure, abnormal neurological exam EXAM: CT HEAD WITHOUT CONTRAST TECHNIQUE: Contiguous axial images were obtained from the base of the skull through the vertex without intravenous contrast. Sagittal and coronal MPR images reconstructed from axial data set. COMPARISON:  06/17/2009 FINDINGS: Brain: Generalized atrophy. Normal ventricular morphology. No midline shift or mass effect. Small vessel chronic ischemic changes of deep cerebral white matter. 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 No intracranial hemorrhage, mass lesion, evidence of acute infarction, or extra-axial fluid collection. Vascular: No hyperdense vessels. Atherosclerotic calcification of internal carotid arteries at skull base. Skull: Intact Sinuses/Orbits: Clear.  Nasal septal deviation to the RIGHT. Other: N/A IMPRESSION: Atrophy with small vessel chronic ischemic changes of deep cerebral white matter. 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 No acute intracranial abnormalities. Electronically Signed   By: Ulyses Southward M.D.   On: 08/10/2021 12:14   MR BRAIN WO CONTRAST  Result Date: 08/14/2021 CLINICAL DATA:  Seizure, abnormal neuro exam EXAM: MRI HEAD WITHOUT CONTRAST TECHNIQUE: Multiplanar, multiecho pulse sequences of the brain and surrounding structures were obtained without intravenous contrast. COMPARISON:  CT head 08/10/2021. FINDINGS: Moderate to severely motion limited exam.  Within this limitation: Brain: No acute infarction, hemorrhage, hydrocephalus, extra-axial collection or mass lesion. Moderate atrophy with ex vacuo ventricular dilation. Mild for age scattered T2 hyperintensities in the white matter, nonspecific but compatible with chronic microvascular  ischemic disease. Vascular: Major arterial flow voids are maintained skull base. Skull and upper cervical spine: Normal marrow signal. Sinuses/Orbits: Clear sinuses.  No acute orbital findings. Other: No sizable mastoid effusions. IMPRESSION: 1. Moderate to severely motion limited exam without obvious acute abnormality. 2. Moderate atrophy and mild chronic microvascular ischemic disease. Electronically Signed   By: Feliberto Harts M.D.   On: 08/14/2021 12:49   CT CHEST ABDOMEN PELVIS W CONTRAST  Result Date: 08/17/2021 CLINICAL DATA:  Sepsis EXAM: CT CHEST, ABDOMEN, AND PELVIS WITH CONTRAST TECHNIQUE: Multidetector CT imaging of the chest, abdomen and pelvis was performed following the standard protocol during bolus administration of intravenous contrast. CONTRAST:  OMNIPAQUE IOHEXOL 350 MG/ML SOLN COMPARISON:  08/14/2021, 02/12/2020 FINDINGS: CT CHEST FINDINGS Cardiovascular: The heart is unremarkable without pericardial effusion. No evidence of thoracic aortic aneurysm or dissection. Minimal atherosclerosis of the aorta and coronary vasculature. 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 Mediastinum/Nodes: Enteric catheter extends into the gastric lumen. Thyroid and trachea are unremarkable. Subcentimeter right paratracheal lymph nodes may be reactive. No pathologic adenopathy. Lungs/Pleura: Large area of cavitation in volume loss within the right apex again noted. Innumerable bilateral nodular opacities are again seen, increased in size and number since previous study. The waxing and waning appearance since 2016 suggests chronic atypical infection such as mycobacterium or fungal organism. Trace bilateral pleural effusions are noted. No pneumothorax. Central airways are patent. Musculoskeletal: There are no acute displaced rib fractures. Chronic bilateral healed rib fractures again noted. Reconstructed images demonstrate no additional  findings. CT ABDOMEN PELVIS FINDINGS Hepatobiliary: There are small calcified gallstones without evidence of acute cholecystitis. Hepatic steatosis again noted, otherwise the liver is unremarkable. Pancreas: Unremarkable. No pancreatic ductal dilatation or surrounding inflammatory changes. Spleen: Normal in size without focal abnormality. Adrenals/Urinary Tract: The kidneys enhance normally and symmetrically. No urinary tract calculi or obstructive uropathy. The adrenals are unremarkable. The bladder is decompressed, limiting its evaluation. Stomach/Bowel: No bowel obstruction or ileus. Normal appendix right lower quadrant. No bowel wall thickening or inflammatory change. Vascular/Lymphatic: Aortic atherosclerosis. No enlarged abdominal or pelvic lymph nodes. Reproductive: Prostate is unremarkable. Other: No free fluid or free intraperitoneal gas. No abdominal wall hernia. Musculoskeletal: No acute bony abnormalities. Chronic compression deformity within the superior endplate of L5. Reconstructed images demonstrate no additional findings. IMPRESSION: 1. Persistent cavitary changes within the right upper lobe, with increased prominence of the bilateral nodular airspace disease seen previously. Given the persistence over time, findings are compatible with chronic infection such as mycobacterium or fungus. 2. Trace bilateral pleural effusions. 3. Subcentimeter reactive right paratracheal lymph nodes. No pathologic adenopathy within the chest, abdomen, or pelvis. 4. Cholelithiasis without cholecystitis. 5. Hepatic steatosis. 6.  Aortic Atherosclerosis (ICD10-I70.0). Electronically Signed   By: Sharlet Salina M.D.   On: 08/17/2021 20:18   CT MAXILLOFACIAL W CONTRAST  Result Date: 08/17/2021 CLINICAL DATA:  Sepsis, bacteremia EXAM: CT MAXILLOFACIAL WITH CONTRAST TECHNIQUE: Multidetector CT imaging of the maxillofacial structures was performed with intravenous contrast. Multiplanar CT image reconstructions were also  generated. CONTRAST:  OMNIPAQUE IOHEXOL 350 MG/ML SOLN COMPARISON:  Brain MRI 08/14/2021, CT head 08/10/2021 FINDINGS: Osseous: The facial bones are unremarkable, with no evidence of fracture or osseous destruction. There are bulky anterior osteophytes throughout the imaged cervical spine. Orbits: The globes and orbits are unremarkable. Sinuses: The paranasal sinuses are clear. The mastoid air cells are clear. Soft tissues: Unremarkable. There is no evidence of abscess. There is no lymphadenopathy in the neck to the level imaged. Limited intracranial: The imaged portions of the intracranial compartment are unremarkable. 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 There is calcified atherosclerotic plaque of the bilateral carotid bulbs and cavernous ICAs without hemodynamically significant stenosis or occlusion. Other: An enteric catheter is in place. IMPRESSION: No evidence of abscess or other acute finding. Electronically Signed   By: Lesia Hausen M.D.   On: 08/17/2021 19:42   DG Chest Port 1 View  Result Date: 08/11/2021 CLINICAL DATA:  Respiratory failure. EXAM: PORTABLE CHEST 1 VIEW COMPARISON:  August 10, 2021. FINDINGS: The heart size and mediastinal contours are within normal limits. Endotracheal and nasogastric tubes are unchanged in position. Stable right apical chronic scarring is noted. Stable right basilar scarring is noted as well. Stable left perihilar nodular opacity is noted. The visualized skeletal structures are unremarkable. IMPRESSION: Stable support apparatus. Stable right basilar and apical scarring.  Stable left perihilar nodular opacity is noted. No significant change compared to prior exam. Electronically Signed   By: Lupita Raider M.D.   On: 08/11/2021 08:07   DG Chest Portable 1 View  Result Date: 08/10/2021 CLINICAL DATA:  Intubation. EXAM: PORTABLE CHEST 1 VIEW COMPARISON:  One-view chest x-ray 08/10/2021 at 10:33 a.m. FINDINGS: Patient is  now intubated. Endotracheal tube terminates 6 cm above the carina. NG tube courses off the inferior border the film. Stable chronic scarring at the right apex. Other patchy opacities unchanged. No significant superimposed acute disease. IMPRESSION: 1. Interval intubation. The endotracheal tube is in satisfactory position. 2. Stable patchy bilateral airspace disease without significant interval change. Electronically Signed   By: Marin Roberts M.D.   On: 08/10/2021 11:44   DG Chest Portable 1 View  Result Date: 08/10/2021 CLINICAL DATA:  Seizure, hypertension, dyspnea EXAM: PORTABLE CHEST 1 VIEW COMPARISON:  Portable exam 1046 hours compared to 04/16/2020 Correlation: CT chest 02/12/2020 FINDINGS: Normal heart size and pulmonary vascularity. Patchy chronic opacities in both lungs corresponding to peribronchial thickening, nodularity, and chronic interstitial prominence. Scarring and volume loss with bullous disease changes at RIGHT apex. Mild mediastinal shift to RIGHT, chronic. No definite acute infiltrate, pleural effusion or pneumothorax. Bones demineralized. IMPRESSION: Extensive chronic lung disease changes with peribronchial thickening, chronic nodularity and chronic interstitial disease. Chronic scarring and volume loss in RIGHT upper lobe with mediastinal shift to RIGHT. Electronically Signed   By: Ulyses Southward M.D.   On: 08/10/2021 10:58   DG Abd Portable 1V  Result Date: 08/14/2021 CLINICAL DATA:  Feeding tube placement. EXAM: PORTABLE ABDOMEN - 1 VIEW COMPARISON:  August 10, 2021. FINDINGS: The bowel gas pattern is normal. Distal tip of feeding tube is seen in expected position of distal stomach. No radio-opaque calculi or other significant radiographic abnormality are seen. IMPRESSION: Distal tip of feeding tube is seen in expected position of distal stomach. Electronically Signed   By: Lupita Raider M.D.   On: 08/14/2021 16:09   DG Abd Portable 1V  Result Date: 08/10/2021 CLINICAL  DATA:  Orogastric tube placement. EXAM: PORTABLE ABDOMEN - 1 VIEW COMPARISON:  Abdominal x-ray 03/14/2013. FINDINGS: Orogastric tube tip is at the level of the mid stomach. No dilated bowel loops are seen in the upper abdomen. IMPRESSION: Orogastric tube tip is at the level of the mid stomach. Electronically Signed   By: Darliss Cheney M.D.   On: 08/10/2021 18:23   EEG adult  Result Date: 08/10/2021 Charlsie Quest, MD     08/10/2021  2:41 PM Patient Name: Frederick [name] MRN: [id number] Epilepsy Attending: Charlsie Quest Referring Physician/Provider: Dr Caryl Pina Date: 08/10/2021 Duration: 22 mins Patient history: 60 y.o. male with a past medical hypertension and EtOH dependence BIB EMS after his wife complained of patient having a stroke.  EMS said that when they arrived he was actively seizing, tonic clonic in nature.  Given 5 mg Versed IM which stopped his seizure.  BG reportedly 70.  EEG to evaluate for status epilepticus. Level of alertness:  lethargic AEDs during EEG study: Versed, LEV Technical aspects: This EEG study was done with scalp electrodes positioned according to the 10-20 International system of electrode placement. Electrical activity was acquired at a sampling rate of 500Hz  and reviewed with a high frequency filter of 70Hz  and a low frequency filter of 1Hz . EEG data were recorded continuously and digitally stored. Description: EEG showed continuous generalized 2 to 3 Hz delta slowing with  overriding 15 to 18 Hz beta activity. Hyperventilation and photic stimulation were not performed.   ABNORMALITY - Continuous slow, generalized IMPRESSION: This study is suggestive of severe diffuse encephalopathy, nonspecific etiology but likely related to sedation. No seizures or epileptiform discharges were seen throughout the recording. Dr. Caryl Pina was notified. Priyanka Annabelle Harman   Overnight EEG with video  Result Date: 08/11/2021 Charlsie Quest, MD     08/12/2021 10:16 AM Patient Name: Frederick [name] MRN: [id number] Epilepsy Attending: Charlsie Quest Referring Physician/Provider: Dr Caryl Pina Duration: 08/10/2021 1330 to 08/11/2021 1330  Patient history: 60 y.o. male with a past medical hypertension and EtOH dependence BIB EMS after his wife complained of patient having a stroke.  EMS said that when they arrived he was actively seizing, tonic clonic in nature.  Given 5 mg Versed IM which stopped his seizure.  BG reportedly 70.  EEG to evaluate for status epilepticus.  Level of alertness:  lethargic  AEDs during EEG study: Versed, LEV  Technical aspects: This EEG study was done with scalp electrodes positioned according to the 10-20 International system of electrode placement. Electrical activity was acquired at a sampling rate of  and reviewed with a high frequency filter of  and a low frequency filter of . EEG data were recorded continuously and digitally stored.  Description: EEG showed continuous generalized 2 to 3 Hz delta slowing with overriding 15 to 18 Hz beta activity. Hyperventilation and photic stimulation were not performed.    ABNORMALITY - Continuous slow, generalized  IMPRESSION: This study is suggestive of severe diffuse encephalopathy, nonspecific etiology but likely related to sedation. No seizures or epileptiform discharges were seen throughout the recording.  Charlsie Quest   ECHOCARDIOGRAM COMPLETE  Result Date: 08/10/2021    ECHOCARDIOGRAM REPORT   Patient Name:   Frederick [name] Date of Exam: 08/10/2021 Medical Rec #:  [id number]      Height:       73.0 in Accession #:    [id number]     Weight:       160.7 lb Date of Birth:  1960-12-27      BSA:          1.961 m Patient Age:    60 years       BP:           114/91 mmHg Patient Gender: M              HR:           122 bpm. Exam Location:  Inpatient Procedure: 2D Echo, Color Doppler and Cardiac Doppler Indications:    Dyspnea  History:        Patient has no prior history of Echocardiogram examinations.                  Respiratory failure.  Sonographer:    Roosvelt Maser RDCS Referring Phys: [id number] GROCE  Sonographer Comments: Very difficult suboptimal images due to patient condition, position and tachycardia IMPRESSIONS  1. Left ventricular ejection fraction, by estimation, is 25 to 30%. The left ventricle has severely decreased function. The left ventricle demonstrates regional wall motion abnormalities. Unusual pattern with the mid LV wall segments appearing akinetic while the apex and the basal segments contract. Possibly a mid-wall Takotsubo variant. Left ventricular diastolic function could not be evaluated.  2. Right ventricular systolic function is mildly reduced. The right ventricular size is normal.  3. The mitral valve is normal in structure.  No evidence of mitral valve regurgitation. No evidence of mitral stenosis.  4. The aortic valve was not well visualized. Aortic valve regurgitation is not visualized. No aortic stenosis is present.  5. Technically difficult study with very limited images. FINDINGS  Left Ventricle: Left ventricular ejection fraction, by estimation, is 25 to 30%. The left ventricle has severely decreased function. The left ventricle demonstrates regional wall motion abnormalities. The left ventricular internal cavity size was normal  in size. There is no left ventricular hypertrophy. Left ventricular diastolic function could not be evaluated. Right Ventricle: The right ventricular size is normal. No increase in right ventricular wall thickness. Right ventricular systolic function is mildly reduced. Left Atrium: Left atrial size was normal in size. Right Atrium: Right atrial size was normal in size. Pericardium: There is no evidence of pericardial effusion. Mitral Valve: The mitral valve is normal in structure. No evidence of mitral valve regurgitation. No evidence of mitral valve stenosis. Tricuspid Valve: The tricuspid valve is normal in structure. Tricuspid valve regurgitation is not  demonstrated. Aortic Valve: The aortic valve was not well visualized. Aortic valve regurgitation is not visualized. No aortic stenosis is present. Pulmonic Valve: The pulmonic valve was not well visualized. Pulmonic valve regurgitation is not visualized. Aorta: The aortic root was not well visualized. Venous: The inferior vena cava was not well visualized. IAS/Shunts: No atrial level shunt detected by color flow Doppler. [employer] Electronically signed by Wilfred Lacy Signature Date/Time: 08/10/2021/6:01:36 PM    Final    ECHO TEE  Result Date: 08/14/2021    TRANSESOPHOGEAL ECHO REPORT   Patient Name:   Frederick [name] Date of Exam: 08/14/2021 Medical Rec #:  [id number]      Height:       73.0 in Accession #:    [id number]     Weight:       160.7 lb Date of Birth:  1960-12-28      BSA:          1.961 m Patient Age:    60 years       BP:           110/70 mmHg Patient Gender: M              HR:           115 bpm. Exam Location:  Inpatient Procedure: Transesophageal Echo Indications:    Bacteremia  History:        Patient has prior history of Echocardiogram examinations.                 Signs/Symptoms:Bacteremia.  Sonographer:    Roosvelt Maser RDCS Referring Phys: [id number] CALLIE E GOODRICH PROCEDURE: The transesophogeal probe was passed without difficulty through the esophogus of the patient. Sedation performed by different physician. The patient developed no complications during the procedure. IMPRESSIONS  1. Left ventricular ejection fraction, by estimation, is 50 to 55%. The left ventricle has low normal function.  2. Right ventricular systolic function is normal. The right ventricular size is normal.  3. Left atrial size was mildly dilated. No left atrial/left atrial appendage thrombus was detected.  4. The mitral valve is normal in structure. No evidence of mitral valve regurgitation.  5. The aortic valve is tricuspid. Aortic valve regurgitation is not visualized. No aortic stenosis is present.  6. Evidence of  atrial level shunting detected by color flow Doppler. There is a small patent foramen ovale with predominantly left to right shunting across the atrial septum. FINDINGS  Left Ventricle: Left ventricular ejection fraction, by estimation, is 50 to 55%. The left ventricle has low normal function. The left ventricular internal cavity size was normal in size. Right Ventricle: The right ventricular size is normal. No increase in right ventricular wall thickness. Right ventricular systolic function is normal. Left Atrium: Left atrial size was mildly dilated. No left atrial/left atrial appendage thrombus was detected. Right Atrium: Right atrial size was normal in size. Pericardium: There is no evidence of pericardial effusion. Mitral Valve: The mitral valve is normal in structure. No evidence of mitral valve regurgitation. There is no evidence of mitral valve vegetation. Tricuspid Valve: The tricuspid valve is normal in structure. Tricuspid valve regurgitation is mild. There is no evidence of tricuspid valve vegetation. Aortic Valve: The aortic valve is tricuspid. Aortic valve regurgitation is not visualized. No aortic stenosis is present. There is no evidence of aortic valve vegetation. Pulmonic Valve: The pulmonic valve was normal in structure. Pulmonic valve regurgitation is trivial. There is no evidence of pulmonic valve vegetation. Aorta: The aortic root is normal in size and structure. IAS/Shunts: Evidence of atrial level shunting detected by color flow Doppler. A small patent foramen ovale is detected with predominantly left to right shunting across the atrial septum. Charlton Haws MD Electronically signed by Charlton Haws MD Signature Date/Time: 08/14/2021/2:03:13 PM    Final    VAS [place] UPPER EXTREMITY VENOUS DUPLEX  Result Date: 08/18/2021 UPPER VENOUS STUDY  Patient Name:  JALYN ROSERO  Date of Exam:   08/18/2021 Medical Rec #: [id number]       Accession #:    [id number] Date of Birth: 03/13/1961       Patient  Gender: M Patient Age:   48 years Exam Location:  [facility] Procedure:      VAS [place] UPPER EXTREMITY VENOUS DUPLEX Referring Phys: A POWELL JR --------------------------------------------------------------------------------  Indications: Edema Comparison Study: No prior study Performing Technologist: Gertie Fey MHA, RDMS, RVT, RDCS  Examination Guidelines: A complete evaluation includes B-mode imaging, spectral Doppler, color Doppler, and power Doppler as needed of all accessible portions of each vessel. Bilateral testing is considered an integral part of a complete examination. Limited examinations for reoccurring indications may be performed as noted.  Right Findings: +----------+------------+---------+-----------+----------+-------+ RIGHT     CompressiblePhasicitySpontaneousPropertiesSummary +----------+------------+---------+-----------+----------+-------+ IJV           Full       Yes       Yes                      +----------+------------+---------+-----------+----------+-------+ Subclavian    Full       Yes       Yes                      +----------+------------+---------+-----------+----------+-------+ Axillary      Full       Yes       Yes                      +----------+------------+---------+-----------+----------+-------+ Brachial      Full       Yes       Yes                      +----------+------------+---------+-----------+----------+-------+ Radial        Full                                          +----------+------------+---------+-----------+----------+-------+  Ulnar         Full                                          +----------+------------+---------+-----------+----------+-------+ Cephalic      Full                                          +----------+------------+---------+-----------+----------+-------+ Basilic       Full                                           +----------+------------+---------+-----------+----------+-------+  Left Findings: +----------+------------+---------+-----------+----------+-----------------+ LEFT      CompressiblePhasicitySpontaneousProperties     Summary      +----------+------------+---------+-----------+----------+-----------------+ IJV           Full       Yes       Yes                                +----------+------------+---------+-----------+----------+-----------------+ Subclavian    None                 No               Age Indeterminate +----------+------------+---------+-----------+----------+-----------------+ Axillary      Full       Yes       Yes                                +----------+------------+---------+-----------+----------+-----------------+ Brachial      Full       Yes       Yes                                +----------+------------+---------+-----------+----------+-----------------+ Radial        Full                                                    +----------+------------+---------+-----------+----------+-----------------+ Ulnar         Full                                                    +----------+------------+---------+-----------+----------+-----------------+ Cephalic      None                                        Acute       +----------+------------+---------+-----------+----------+-----------------+  Summary:  Right: No evidence of deep vein thrombosis in the upper extremity. No evidence of superficial vein thrombosis in the upper extremity.  Left: Findings consistent with acute superficial vein thrombosis involving the left cephalic vein. 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  *See table(s) above for measurements and observations.  Diagnosing physician: Coral Else MD Electronically signed by Coral Else MD on 08/18/2021 at 7:10:54 PM.    Final      Subjective: He is alert,  rectal tube put. Diarrhea improved. He is eating more.   Discharge Exam: Vitals:   08/28/21 0424 08/28/21 0929  BP: 112/63 121/74  Pulse: 98 (!) 105  Resp: 18 20  Temp: 97.8 F (36.6 C) 98.1 F (36.7 C)  SpO2: 99%      General: Pt is alert, awake, not in acute distress Cardiovascular: RRR, S1/S2 +, no rubs, no gallops Respiratory: CTA bilaterally, no wheezing, no rhonchi Abdominal: Soft, NT, ND, bowel sounds + Extremities: no edema, no cyanosis    The results of significant diagnostics from this hospitalization (including imaging, microbiology, ancillary and laboratory) are listed below for reference.     Microbiology: Recent Results (from the past 240 hour(s))  SARS CORONAVIRUS 2 (TAT 6-24 HRS) Nasopharyngeal Nasopharyngeal Swab     Status: None   Collection Time: 08/27/21 12:32 PM   Specimen: Nasopharyngeal Swab  Result Value Ref Range Status   SARS Coronavirus 2 NEGATIVE NEGATIVE Final    Comment: (NOTE) SARS-CoV-2 target nucleic acids are NOT DETECTED.  The SARS-CoV-2 RNA is generally detectable in upper and lower respiratory specimens during the acute phase of infection. Negative results do not preclude SARS-CoV-2 infection, do not rule out co-infections with other pathogens, and should not be used as the sole basis for treatment or other patient management decisions. Negative results must be combined with clinical observations, patient history, and epidemiological information. The expected result is Negative.  Fact Sheet for Patients: HairSlick.no  Fact Sheet for Healthcare Providers: quierodirigir.com  This test is not yet approved or cleared by the [place] FDA and  has been authorized for detection and/or diagnosis of SARS-CoV-2 by FDA under an Emergency Use Authorization (EUA). This EUA will remain  in effect (meaning this test can be used) for the duration of the COVID-19 declaration under Se  ction 564(b)(1) of the Act, 21 U.S.C. section 360bbb-3(b)(1), unless the authorization is terminated or revoked sooner.  Performed at [facility], [address], [place], [place] [address]      Labs: BNP (last 3 results) Recent Labs    08/10/21 1537  BNP 401.9*   Basic Metabolic Panel: Recent Labs  Lab 08/25/21 0300 08/26/21 0347 08/26/21 1025 08/26/21 1942 08/27/21 0356 08/28/21 0348  NA 131*  --  132* 131* 131* 133*  K 4.0  --  4.2 4.0 4.9 4.2  CL 101  --  102 100 99 101  CO2 22  --  GLUCOSE 97  --  97 106* 107* 105*  BUN 6*  --  <5* 5* 8 5*  CREATININE 0.82  --  0.88 0.86 0.87 0.84  CALCIUM 8.8*  --  8.9 8.7* 8.8* 8.7*  MG 1.3* 2.3  --  1.7 1.6* 1.7   Liver Function Tests: Recent Labs  Lab 08/25/21 0300  AST 22  ALT 16  ALKPHOS 56  BILITOT 0.3  PROT 6.5  ALBUMIN 2.0*   No results for input(s): LIPASE, AMYLASE in the last 168 hours. No results for input(s): AMMONIA in the last 168 hours. CBC: Recent Labs  Lab 08/25/21 0300 08/26/21 0347 08/26/21 1942 08/27/21 0356 08/28/21 0348  WBC 9.0 9.6 9.6 10.1 9.8  HGB 10.5* 11.4* 10.8* 10.4* 10.8*  HCT 30.6* 33.5* 31.8* 31.3* 32.3*  MCV 92.2 92.8 92.7 93.7 93.6  PLT 365 388 366 359 381   Cardiac Enzymes: No results for input(s): CKTOTAL, CKMB, CKMBINDEX, TROPONINI in the last 168 hours. BNP: Invalid input(s): POCBNP CBG: Recent Labs  Lab 08/27/21 0615 08/27/21 1110 08/27/21 1111 08/27/21 1655 08/28/21 0646  GLUCAP 77 54* 134* 119* 125*   D-Dimer No results for input(s): DDIMER in the last 72 hours. Hgb A1c No results for input(s): HGBA1C in the last 72 hours. Lipid Profile No results for input(s): CHOL, HDL, LDLCALC, TRIG, CHOLHDL, LDLDIRECT in the last 72 hours. Thyroid function studies No results for input(s): TSH, T4TOTAL, T3FREE, THYROIDAB in the last 72 hours.  Invalid input(s): FREET3 Anemia work up No results for input(s): VITAMINB12, FOLATE, FERRITIN, TIBC,  IRON, RETICCTPCT in the last 72 hours. Urinalysis    Component Value Date/Time   COLORURINE YELLOW 08/10/2021 1613   APPEARANCEUR HAZY (A) 08/10/2021 1613   LABSPEC 1.012 08/10/2021 1613   PHURINE 5.0 08/10/2021 1613   GLUCOSEU NEGATIVE 08/10/2021 1613   HGBUR MODERATE (A) 08/10/2021 1613   BILIRUBINUR NEGATIVE 08/10/2021 1613   KETONESUR NEGATIVE 08/10/2021 1613   PROTEINUR NEGATIVE 08/10/2021 1613   UROBILINOGEN 1.0 03/02/2015 1136   NITRITE NEGATIVE 08/10/2021 1613   LEUKOCYTESUR NEGATIVE 08/10/2021 1613   Sepsis Labs Invalid input(s): PROCALCITONIN,  WBC,  LACTICIDVEN Microbiology Recent Results (from the past 240 hour(s))  SARS CORONAVIRUS 2 (TAT 6-24 HRS) Nasopharyngeal Nasopharyngeal Swab     Status: None   Collection Time: 08/27/21 12:32 PM   Specimen: Nasopharyngeal Swab  Result Value Ref Range Status   SARS Coronavirus 2 NEGATIVE NEGATIVE Final    Comment: (NOTE) SARS-CoV-2 target nucleic acids are NOT DETECTED.  The SARS-CoV-2 RNA is generally detectable in upper and lower respiratory specimens during the acute phase of infection. Negative results do not preclude SARS-CoV-2 infection, do not rule out co-infections with other pathogens, and should not be used as the sole basis for treatment or other patient management decisions. Negative results must be combined with clinical observations, patient history, and epidemiological information. The expected result is Negative.  Fact Sheet for Patients: HairSlick.no  Fact Sheet for Healthcare Providers: quierodirigir.com  This test is not yet approved or cleared by the [place] FDA and  has been authorized for detection and/or diagnosis of SARS-CoV-2 by FDA under an Emergency Use Authorization (EUA). This EUA will remain  in effect (meaning this test can be used) for the duration of the COVID-19 declaration under Se ction 564(b)(1) of the Act, 21  U.S.C. section 360bbb-3(b)(1), unless the authorization is terminated or revoked sooner.  Performed at [facility], [address], [place], [place] [address]      Time coordinating discharge: 40 minutes  SIGNED:   Alba Cory, MD  [facility]

## 2021-08-28 NOTE — TOC Transition Note (Signed)
Transition of Care [facility]) - CM/SW Discharge Note   Patient Details  Name: Frederick [name] MRN: [id number] Date of Birth: 15-Aug-1961  [facility] CM/SW Contact:  Carley Hammed, LCSWA Phone Number: 08/28/2021, 11:03 AM   Clinical Narrative:    Pt to be transported to [facility] via [place].  Nurse to call report to [phone].   Final next level of care: Skilled Nursing Facility Barriers to Discharge: Barriers Resolved   Patient Goals and CMS Choice        Discharge Placement              Patient chooses bed at:  [facility]) Patient to be transferred to facility by: [facility] Name of family member notified: Dois Davenport Patient and family notified of of transfer: 08/28/21  Discharge Plan and Services   Discharge Planning Services: CM Consult                                 Social Determinants of Health (SDOH) Interventions     Readmission Risk Interventions No flowsheet data found.

## 2021-08-28 NOTE — Progress Notes (Signed)
Report given to [facility]. All questions were answered.

## 2021-08-31 DIAGNOSIS — J690 Pneumonitis due to inhalation of food and vomit: Secondary | ICD-10-CM | POA: Diagnosis not present

## 2021-08-31 DIAGNOSIS — E440 Moderate protein-calorie malnutrition: Secondary | ICD-10-CM | POA: Diagnosis not present

## 2021-08-31 DIAGNOSIS — I5021 Acute systolic (congestive) heart failure: Secondary | ICD-10-CM | POA: Diagnosis not present

## 2021-08-31 DIAGNOSIS — R1310 Dysphagia, unspecified: Secondary | ICD-10-CM | POA: Diagnosis not present

## 2021-09-03 DIAGNOSIS — R5381 Other malaise: Secondary | ICD-10-CM | POA: Diagnosis not present

## 2021-09-03 DIAGNOSIS — I5021 Acute systolic (congestive) heart failure: Secondary | ICD-10-CM | POA: Diagnosis not present

## 2021-09-03 DIAGNOSIS — E440 Moderate protein-calorie malnutrition: Secondary | ICD-10-CM | POA: Diagnosis not present

## 2021-09-03 DIAGNOSIS — R569 Unspecified convulsions: Secondary | ICD-10-CM | POA: Diagnosis not present

## 2021-09-07 DIAGNOSIS — R1310 Dysphagia, unspecified: Secondary | ICD-10-CM | POA: Diagnosis not present

## 2021-09-07 DIAGNOSIS — R270 Ataxia, unspecified: Secondary | ICD-10-CM | POA: Diagnosis not present

## 2021-09-07 DIAGNOSIS — R5381 Other malaise: Secondary | ICD-10-CM | POA: Diagnosis not present

## 2021-09-07 DIAGNOSIS — J690 Pneumonitis due to inhalation of food and vomit: Secondary | ICD-10-CM | POA: Diagnosis not present

## 2021-09-07 DIAGNOSIS — E440 Moderate protein-calorie malnutrition: Secondary | ICD-10-CM | POA: Diagnosis not present

## 2021-09-09 ENCOUNTER — Encounter: Payer: Self-pay | Admitting: Infectious Disease

## 2021-09-09 ENCOUNTER — Ambulatory Visit (INDEPENDENT_AMBULATORY_CARE_PROVIDER_SITE_OTHER): Payer: Medicare Other | Admitting: Infectious Disease

## 2021-09-09 ENCOUNTER — Other Ambulatory Visit: Payer: Self-pay

## 2021-09-09 VITALS — BP 109/72 | HR 98 | Ht 73.0 in | Wt 166.0 lb

## 2021-09-09 DIAGNOSIS — B955 Unspecified streptococcus as the cause of diseases classified elsewhere: Secondary | ICD-10-CM

## 2021-09-09 DIAGNOSIS — R569 Unspecified convulsions: Secondary | ICD-10-CM | POA: Diagnosis not present

## 2021-09-09 DIAGNOSIS — B951 Streptococcus, group B, as the cause of diseases classified elsewhere: Secondary | ICD-10-CM | POA: Diagnosis not present

## 2021-09-09 DIAGNOSIS — M869 Osteomyelitis, unspecified: Secondary | ICD-10-CM | POA: Diagnosis not present

## 2021-09-09 DIAGNOSIS — Z8611 Personal history of tuberculosis: Secondary | ICD-10-CM

## 2021-09-09 DIAGNOSIS — J984 Other disorders of lung: Secondary | ICD-10-CM | POA: Diagnosis not present

## 2021-09-09 DIAGNOSIS — N183 Chronic kidney disease, stage 3 unspecified: Secondary | ICD-10-CM | POA: Diagnosis not present

## 2021-09-09 DIAGNOSIS — R7881 Bacteremia: Secondary | ICD-10-CM | POA: Diagnosis not present

## 2021-09-09 DIAGNOSIS — F1029 Alcohol dependence with unspecified alcohol-induced disorder: Secondary | ICD-10-CM

## 2021-09-09 HISTORY — DX: Unspecified streptococcus as the cause of diseases classified elsewhere: B95.5

## 2021-09-09 NOTE — Progress Notes (Signed)
Subjective:  Chief complaint: Patient is wondering why he is in our clinic today   Patient ID: Frederick [name], male    DOB: 1961/10/04, 60 y.o.   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  Apparently the seizures may have been alcohol withdrawal seizures.  He was intubated due to respiratory failure and need to protect his airway.  He had CT of the lungs performed which showed the cavitary upper lobe pathology with mediastinal shift to the right along with multiple bilateral lung nodules that have apparently increased and decreased in size over the years.  Radiology suggested a possible atypical infection with a nontuberculous mycobacteria or fungal infection.  Blood cultures done on admission yielded Streptococcus angina gnosis.  He was treated initially with Unasyn and then ceftriaxone.  His blood cultures cleared his transesophageal echocardiogram was negative for endocarditis.  He was ultimately switched over to Zyvox to complete a course of therapy through September 21.  Is actually still in the hospital through 23 September and was discharged to assisted living.  He has no complaints today in terms of fevers chills or malaise he does have somewhat of a chronic cough but has not worsened.    Past Medical History:  Diagnosis Date   Arthritis    Asthma    Bilateral lower extremity edema 05/28/2021   propping feet up   Cough 05/28/2021   with clear sputum   Dyspnea    Dyspnea 05/28/2021   with exertion   EtOH dependence (HCC)    drinks 2-3 of 40ounes a day   Fibula fracture    Dr Dion Saucier 02-2011   Finger osteomyelitis, right (HCC) 05/26/2021   right index finger   Hernia    surgery 01-17-13   Hypertension    not taking bp meds since may 2022    Streptococcal bacteremia 09/09/2021   Tuberculosis    couple of yrs ago took tx at [facility] departmen per pt on 05-28-2021    Past Surgical History:  Procedure Laterality Date   AMPUTATION Right 05/29/2021   Procedure: PARTIAL RIGHT 2ND FINGER AMPUTATION;  Surgeon: Kathryne Hitch, MD;  Location: [facility];  Service: Orthopedics;  Laterality: Right;   ANKLE SURGERY     INGUINAL HERNIA REPAIR Right 01/19/2013   Procedure: HERNIA REPAIR INGUINAL ADULT;  Surgeon: [name], MD,FACS;  Location: [facility] OR;  Service: General;  Laterality: Right;   TEE WITHOUT CARDIOVERSION N/A 08/14/2021   Procedure: TRANSESOPHAGEAL ECHOCARDIOGRAM (TEE);  Surgeon: Wendall Stade, MD;  [facility] END[facility]COPY;  Service: Cardiovascular;  Laterality: N/A;   TIBIA IM NAIL INSERTION Left 01/22/2013   Procedure: INTRAMEDULLARY (IM) NAIL TIBIAL;  Surgeon: [name], MD;  Location: [facility] OR;  Service: Orthopedics;  Laterality: Left;    Family History  Problem Relation Age of Onset   Diabetes Mother    Diabetes Father    Hypertension Father       Social History   Socioeconomic History   Marital status: Married    Spouse name: Not on file   Number of children: Not on file   Years of education: Not on file   Highest education level: Not on file  Occupational History   Not on file  Tobacco Use  Smoking status: Every Day    Packs/day: 0.50    Types: Cigarettes   Smokeless tobacco: Never   Tobacco comments:    1 ppd  Vaping Use   Vaping Use: Never used  Substance and Sexual Activity   Alcohol use: Yes    Comment: 1 beer this am per pt on 05-28-2021   Drug use: No   Sexual activity: Never  Other Topics Concern   Not on file  Social History Narrative   Not on file   Social Determinants of Health   Financial Resource Strain: Not on file  Food Insecurity: Not on file  Transportation Needs: Not on file  Physical Activity: Not on file  Stress: Not on file  Social  Connections: Not on file    No Known Allergies   Current Outpatient Medications:    albuterol (VENTOLIN HFA) 108 (90 Base) MCG/ACT inhaler, Inhale 2 puffs into the lungs every 4 (four) hours as needed. (Patient not taking: Reported on 09/09/2021), Disp: , Rfl:    apixaban (ELIQUIS) 5 MG TABS tablet, Take 2 tablets (10 mg total) by mouth 2 (two) times daily for 5 days. (Patient not taking: Reported on 09/09/2021), Disp: 20 tablet, Rfl: 0   apixaban (ELIQUIS) 5 MG TABS tablet, Take 1 tablet (5 mg total) by mouth 2 (two) times daily. (Patient not taking: Reported on 09/09/2021), Disp: 60 tablet, Rfl: 3   carvedilol (COREG) 6.25 MG tablet, Take 1 tablet (6.25 mg total) by mouth 2 (two) times daily with a meal. (Patient not taking: Reported on 09/09/2021), Disp: 60 tablet, Rfl: 3   feeding supplement (ENSURE ENLIVE / ENSURE PLUS) LIQD, Take 237 mLs by mouth 3 (three) times daily between meals. (Patient not taking: Reported on 09/09/2021), Disp: 237 mL, Rfl: 12   folic acid (FOLVITE) 1 MG tablet, Take 1 tablet (1 mg total) by mouth daily. (Patient not taking: Reported on 09/09/2021), Disp: 30 tablet, Rfl: 3   ipratropium-albuterol (DUONEB) 0.5-2.5 (3) MG/3ML SOLN, Take 3 mLs by nebulization every 6 (six) hours as needed. (Patient not taking: Reported on 09/09/2021), Disp: 360 mL, Rfl: 1   levETIRAcetam (KEPPRA) 1000 MG tablet, Take 1 tablet (1,000 mg total) by mouth 2 (two) times daily. (Patient not taking: Reported on 09/09/2021), Disp: 60 tablet, Rfl: 5   magnesium oxide (MAG-OX) 400 (240 Mg) MG tablet, Take 0.5 tablets (200 mg total) by mouth 2 (two) times daily. (Patient not taking: Reported on 09/09/2021), Disp: 30 tablet, Rfl: 0   Multiple Vitamin (MULTIVITAMIN WITH MINERALS) TABS tablet, Take 1 tablet by mouth daily. (Patient not taking: Reported on 09/09/2021), Disp: 30 tablet, Rfl: 0   nicotine (NICODERM CQ - DOSED IN MG/24 HOURS) 21 mg/24hr patch, Place 1 patch (21 mg total) onto the skin daily. (Patient  not taking: Reported on 09/09/2021), Disp: 28 patch, Rfl: 0   saccharomyces boulardii (FLORASTOR) 250 MG capsule, Take 1 capsule (250 mg total) by mouth 2 (two) times daily. (Patient not taking: Reported on 09/09/2021), Disp: 30 capsule, Rfl: 0   thiamine 100 MG tablet, Take 1 tablet (100 mg total) by mouth daily. (Patient not taking: Reported on 09/09/2021), Disp: 30 tablet, Rfl: 3   vitamin B-12 100 MCG tablet, Take 1 tablet (100 mcg total) by mouth daily. (Patient not taking: Reported on 09/09/2021), Disp: 30 tablet, Rfl: 3   Past Medical History:  Diagnosis Date   Arthritis    Asthma    Bilateral lower extremity edema 05/28/2021   propping feet up  Cough 05/28/2021   with clear sputum   Dyspnea    Dyspnea 05/28/2021   with exertion   EtOH dependence (HCC)    drinks 2-3 of 40ounes a day   Fibula fracture    Dr Dion Saucier 02-2011   Finger osteomyelitis, right (HCC) 05/26/2021   right index finger   Hernia    surgery 01-17-13   Hypertension    not taking bp meds since may 2022   Streptococcal bacteremia 09/09/2021   Tuberculosis    couple of yrs ago took tx at [facility] depart[facility] per pt on 05-28-2021    Past Surgical History:  Procedure Laterality Date   AMPUTATION Right 05/29/2021   Procedure: PARTIAL RIGHT 2ND FINGER AMPUTATION;  Surgeon: Kathryne Hitch, MD;  Location: [facility];  Service: Orthopedics;  Laterality: Right;   ANKLE SURGERY     INGUINAL HERNIA REPAIR Right 01/19/2013   Procedure: HERNIA REPAIR INGUINAL ADULT;  Surgeon: [name], MD,FACS;  Location: [facility] OR;  Service: General;  Laterality: Right;   TEE WITHOUT CARDIOVERSION N/A 08/14/2021   Procedure: TRANSESOPHAGEAL ECHOCARDIOGRAM (TEE);  Surgeon: Wendall Stade, MD;  Location[facility] ENDOSCOPY;  Service: Cardiovascular;  Laterality: N/A;   TIBIA IM NAIL INSERTION Left 01/22/2013   Procedure: INTRAMEDULLARY (IM) NAIL TIBIAL;  Surgeon: [name], MD;  Location: [facility] OR;  Service:  Orthopedics;  Laterality: Left;    Family History  Problem Relation Age of Onset   Diabetes Mother    Diabetes Father    Hypertension Father       Social History   Socioeconomic History   Marital status: Married    Spouse name: Not on file   Number of children: Not on file   Years of education: Not on file   Highest education level: Not on file  Occupational History   Not on file  Tobacco Use   Smoking status: Every Day    Packs/day: 0.50    Types: Cigarettes   Smokeless tobacco: Never   Tobacco comments:    1 ppd  Vaping Use   Vaping Use: Never used  Substance and Sexual Activity   Alcohol use: Yes    Comment: 1 beer this am per pt on 05-28-2021   Drug use: No   Sexual activity: Never  Other Topics Concern   Not on file  Social History Narrative   Not on file   Social Determinants of Health   Financial Resource Strain: Not on file  Food Insecurity: Not on file  Transportation Needs: Not on file  Physical Activity: Not on file  Stress: Not on file  Social Connections: Not on file    No Known Allergies   Current Outpatient Medications:    albuterol (VENTOLIN HFA) 108 (90 Base) MCG/ACT inhaler, Inhale 2 puffs into the lungs every 4 (four) hours as needed. (Patient not taking: Reported on 09/09/2021), Disp: , Rfl:    apixaban (ELIQUIS) 5 MG TABS tablet, Take 2 tablets (10 mg total) by mouth 2 (two) times daily for 5 days. (Patient not taking: Reported on 09/09/2021), Disp: 20 tablet, Rfl: 0   apixaban (ELIQUIS) 5 MG TABS tablet, Take 1 tablet (5 mg total) by mouth 2 (two) times daily. (Patient not taking: Reported on 09/09/2021), Disp: 60 tablet, Rfl: 3   carvedilol (COREG) 6.25 MG tablet, Take 1 tablet (6.25 mg total) by mouth 2 (two) times daily with a meal. (Patient not taking: Reported on 09/09/2021), Disp: 60 tablet, Rfl: 3  feeding supplement (ENSURE ENLIVE / ENSURE PLUS) LIQD, Take 237 mLs by mouth 3 (three) times daily between meals. (Patient not taking:  Reported on 09/09/2021), Disp: 237 mL, Rfl: 12   folic acid (FOLVITE) 1 MG tablet, Take 1 tablet (1 mg total) by mouth daily. (Patient not taking: Reported on 09/09/2021), Disp: 30 tablet, Rfl: 3   ipratropium-albuterol (DUONEB) 0.5-2.5 (3) MG/3ML SOLN, Take 3 mLs by nebulization every 6 (six) hours as needed. (Patient not taking: Reported on 09/09/2021), Disp: 360 mL, Rfl: 1   levETIRAcetam (KEPPRA) 1000 MG tablet, Take 1 tablet (1,000 mg total) by mouth 2 (two) times daily. (Patient not taking: Reported on 09/09/2021), Disp: 60 tablet, Rfl: 5   magnesium oxide (MAG-OX) 400 (240 Mg) MG tablet, Take 0.5 tablets (200 mg total) by mouth 2 (two) times daily. (Patient not taking: Reported on 09/09/2021), Disp: 30 tablet, Rfl: 0   Multiple Vitamin (MULTIVITAMIN WITH MINERALS) TABS tablet, Take 1 tablet by mouth daily. (Patient not taking: Reported on 09/09/2021), Disp: 30 tablet, Rfl: 0   nicotine (NICODERM CQ - DOSED IN MG/24 HOURS) 21 mg/24hr patch, Place 1 patch (21 mg total) onto the skin daily. (Patient not taking: Reported on 09/09/2021), Disp: 28 patch, Rfl: 0   saccharomyces boulardii (FLORASTOR) 250 MG capsule, Take 1 capsule (250 mg total) by mouth 2 (two) times daily. (Patient not taking: Reported on 09/09/2021), Disp: 30 capsule, Rfl: 0   thiamine 100 MG tablet, Take 1 tablet (100 mg total) by mouth daily. (Patient not taking: Reported on 09/09/2021), Disp: 30 tablet, Rfl: 3   vitamin B-12 100 MCG tablet, Take 1 tablet (100 mcg total) by mouth daily. (Patient not taking: Reported on 09/09/2021), Disp: 30 tablet, Rfl: 3  Review of Systems  Constitutional:  Negative for activity change, appetite change, chills, diaphoresis, fatigue, fever and unexpected weight change.  HENT:  Negative for congestion, rhinorrhea, sinus pressure, sneezing, sore throat and trouble swallowing.   Eyes:  Negative for photophobia and visual disturbance.  Respiratory:  Negative for cough, chest tightness, shortness of breath,  wheezing and stridor.   Cardiovascular:  Negative for chest pain, palpitations and leg swelling.  Gastrointestinal:  Negative for abdominal distention, abdominal pain, anal bleeding, blood in stool, constipation, diarrhea, nausea and vomiting.  Genitourinary:  Negative for difficulty urinating, dysuria, flank pain and hematuria.  Musculoskeletal:  Negative for arthralgias, back pain, gait problem, joint swelling and myalgias.  Skin:  Negative for color change, pallor, rash and wound.  Neurological:  Negative for dizziness, tremors, weakness and light-headedness.  Hematological:  Negative for adenopathy. Does not bruise/bleed easily.  Psychiatric/Behavioral:  Negative for agitation, behavioral problems, confusion, decreased concentration, dysphoric mood and sleep disturbance.       Objective:   Physical Exam Constitutional:      Appearance: He is well-developed.  HENT:     Head: Normocephalic and atraumatic.  Eyes:     Conjunctiva/sclera: Conjunctivae normal.  Cardiovascular:     Rate and Rhythm: Normal rate and regular rhythm.     Heart sounds: No murmur heard.   No friction rub. No gallop.  Pulmonary:     Effort: Pulmonary effort is normal. No respiratory distress.     Breath sounds: No stridor. No wheezing or rhonchi.  Chest:     Chest wall: No tenderness.  Abdominal:     General: There is no distension.     Palpations: Abdomen is soft.  Musculoskeletal:        General: No tenderness. Normal range  of motion.     Cervical back: Normal range of motion and neck supple.  Skin:    General: Skin is warm and dry.     Coloration: Skin is not pale.     Findings: No erythema or rash.  Neurological:     General: No focal deficit present.     Mental Status: He is alert and oriented to person, place, and time.  Psychiatric:        Mood and Affect: Mood normal.        Behavior: Behavior normal.        Thought Content: Thought content normal.        Judgment: Judgment normal.      Left finger where he had prior surgery is healed and scarred     Assessment & Plan:  Streptococcus anginosus bacteremia:  Will check surveillance blood cultures today.  Cavitary lung disease due to prior tuberculosis also with multiple lung nodules that have increased and decreased in size over the years:  I do not see any reason to aggressively work-up these lung nodules at this point in time if they are increasing and decreasing they are clearly not malignant.  Fungal infection would seem exceedingly unlikely to me.  Nontuberculous mycobacterial infection is possible as these organisms can be sensitive to antibiotics that are sometimes used to treat other pneumonias or infections   Again at this point in time I would not be in favor of any aggressive work-up.  I certainly would not be in favor of repeat CT scan at this point in time unless there is some clinical indication.  Alcoholism: Likely had an alcohol withdrawal related seizure  I spent 41 minutes with the patient including face to face counseling of the patient regarding the nature of streptococcal bacteremia as tuberculosis and his lung nodules personally reviewing T of the lungs from his September admission, his blood culture data transesophageal echocardiogram along with review of medical records before and during the visit and in coordination of his care.

## 2021-09-14 DIAGNOSIS — J690 Pneumonitis due to inhalation of food and vomit: Secondary | ICD-10-CM | POA: Diagnosis not present

## 2021-09-14 DIAGNOSIS — E440 Moderate protein-calorie malnutrition: Secondary | ICD-10-CM | POA: Diagnosis not present

## 2021-09-14 DIAGNOSIS — I5021 Acute systolic (congestive) heart failure: Secondary | ICD-10-CM | POA: Diagnosis not present

## 2021-09-14 DIAGNOSIS — I509 Heart failure, unspecified: Secondary | ICD-10-CM | POA: Diagnosis not present

## 2021-09-14 DIAGNOSIS — R1310 Dysphagia, unspecified: Secondary | ICD-10-CM | POA: Diagnosis not present

## 2021-09-14 DIAGNOSIS — R270 Ataxia, unspecified: Secondary | ICD-10-CM | POA: Diagnosis not present

## 2021-09-14 DIAGNOSIS — R262 Difficulty in walking, not elsewhere classified: Secondary | ICD-10-CM | POA: Diagnosis not present

## 2021-09-14 DIAGNOSIS — R569 Unspecified convulsions: Secondary | ICD-10-CM | POA: Diagnosis not present

## 2021-09-15 LAB — CULTURE, BLOOD (SINGLE)
MICRO NUMBER:: 12481636
MICRO NUMBER:: 12481637
Result:: NO GROWTH
Result:: NO GROWTH
SPECIMEN QUALITY:: ADEQUATE
SPECIMEN QUALITY:: ADEQUATE

## 2021-09-24 DIAGNOSIS — M170 Bilateral primary osteoarthritis of knee: Secondary | ICD-10-CM | POA: Diagnosis not present

## 2021-09-24 DIAGNOSIS — R296 Repeated falls: Secondary | ICD-10-CM | POA: Diagnosis not present

## 2021-09-24 DIAGNOSIS — J449 Chronic obstructive pulmonary disease, unspecified: Secondary | ICD-10-CM | POA: Diagnosis not present

## 2021-09-24 DIAGNOSIS — M545 Low back pain, unspecified: Secondary | ICD-10-CM | POA: Diagnosis not present

## 2021-09-26 DIAGNOSIS — R569 Unspecified convulsions: Secondary | ICD-10-CM | POA: Diagnosis not present

## 2021-09-26 DIAGNOSIS — N183 Chronic kidney disease, stage 3 unspecified: Secondary | ICD-10-CM | POA: Diagnosis not present

## 2021-09-26 DIAGNOSIS — R7881 Bacteremia: Secondary | ICD-10-CM | POA: Diagnosis not present

## 2021-09-26 DIAGNOSIS — B955 Unspecified streptococcus as the cause of diseases classified elsewhere: Secondary | ICD-10-CM | POA: Diagnosis not present

## 2021-09-26 DIAGNOSIS — R1310 Dysphagia, unspecified: Secondary | ICD-10-CM | POA: Diagnosis not present

## 2021-09-30 ENCOUNTER — Ambulatory Visit: Payer: Medicare Other | Admitting: Neurology

## 2021-09-30 ENCOUNTER — Ambulatory Visit: Payer: Medicare Other | Admitting: Physician Assistant

## 2021-09-30 DIAGNOSIS — B955 Unspecified streptococcus as the cause of diseases classified elsewhere: Secondary | ICD-10-CM | POA: Diagnosis not present

## 2021-09-30 DIAGNOSIS — R1310 Dysphagia, unspecified: Secondary | ICD-10-CM | POA: Diagnosis not present

## 2021-09-30 DIAGNOSIS — R569 Unspecified convulsions: Secondary | ICD-10-CM | POA: Diagnosis not present

## 2021-09-30 DIAGNOSIS — R7881 Bacteremia: Secondary | ICD-10-CM | POA: Diagnosis not present

## 2021-09-30 DIAGNOSIS — N183 Chronic kidney disease, stage 3 unspecified: Secondary | ICD-10-CM | POA: Diagnosis not present

## 2021-10-02 DIAGNOSIS — R1310 Dysphagia, unspecified: Secondary | ICD-10-CM | POA: Diagnosis not present

## 2021-10-02 DIAGNOSIS — N183 Chronic kidney disease, stage 3 unspecified: Secondary | ICD-10-CM | POA: Diagnosis not present

## 2021-10-02 DIAGNOSIS — R7881 Bacteremia: Secondary | ICD-10-CM | POA: Diagnosis not present

## 2021-10-02 DIAGNOSIS — R569 Unspecified convulsions: Secondary | ICD-10-CM | POA: Diagnosis not present

## 2021-10-02 DIAGNOSIS — B955 Unspecified streptococcus as the cause of diseases classified elsewhere: Secondary | ICD-10-CM | POA: Diagnosis not present

## 2021-10-05 DIAGNOSIS — B955 Unspecified streptococcus as the cause of diseases classified elsewhere: Secondary | ICD-10-CM | POA: Diagnosis not present

## 2021-10-05 DIAGNOSIS — N183 Chronic kidney disease, stage 3 unspecified: Secondary | ICD-10-CM | POA: Diagnosis not present

## 2021-10-05 DIAGNOSIS — R569 Unspecified convulsions: Secondary | ICD-10-CM | POA: Diagnosis not present

## 2021-10-05 DIAGNOSIS — R7881 Bacteremia: Secondary | ICD-10-CM | POA: Diagnosis not present

## 2021-10-05 DIAGNOSIS — R1310 Dysphagia, unspecified: Secondary | ICD-10-CM | POA: Diagnosis not present

## 2021-10-14 ENCOUNTER — Ambulatory Visit (INDEPENDENT_AMBULATORY_CARE_PROVIDER_SITE_OTHER): Payer: Medicare Other | Admitting: Physician Assistant

## 2021-10-14 ENCOUNTER — Encounter: Payer: Self-pay | Admitting: Physician Assistant

## 2021-10-14 DIAGNOSIS — G8929 Other chronic pain: Secondary | ICD-10-CM | POA: Diagnosis not present

## 2021-10-14 DIAGNOSIS — M25562 Pain in left knee: Secondary | ICD-10-CM

## 2021-10-14 DIAGNOSIS — M25561 Pain in right knee: Secondary | ICD-10-CM

## 2021-10-14 MED ORDER — METHYLPREDNISOLONE ACETATE 40 MG/ML IJ SUSP
40.0000 mg | INTRAMUSCULAR | Status: AC | PRN
  Administered 2021-10-14: 40 mg via INTRA_ARTICULAR

## 2021-10-14 MED ORDER — LIDOCAINE HCL 1 % IJ SOLN
3.0000 mL | INTRAMUSCULAR | Status: AC | PRN
  Administered 2021-10-14: 3 mL

## 2021-10-14 NOTE — Progress Notes (Signed)
   Procedure Note  Patient: Frederick [name]             Date of Birth: 06/07/1961           MRN: [id number]             Visit Date: 10/14/2021 Frederick [name] is similarly seen in the past for bilateral knee pain.  He last had cortisone injections both knees on 05/14/2020.  He states that his pain in both knees was much improved with the injections for over a year.  Said no new injury to either knee.  He is requesting injections of both knees today. In September he was hospitalized for multiple issues including Streptococcus bacteremia.  He is followed up with infectious disease last month and had blood cultures that were negative.  He is currently on no medications.  He has no follow-up scheduled with infectious disease.  He denies any fevers or chills.  Review of systems see HPI otherwise negative.  Bilateral knees: No abnormal warmth erythema.  Slight effusion left knee only.  No instability valgus varus stressing of either knee.  He has full extension both knees in flexion to beyond 90 degrees.  Procedures: Visit Diagnoses:  1. Chronic pain of left knee   2. Chronic pain of right knee     Large Joint Inj: bilateral knee on 10/14/2021 1:54 PM Indications: pain Details: 22 G 1.5 in needle, anterolateral approach  Arthrogram: No  Medications (Right): 3 mL lidocaine 1 %; 40 mg methylPREDNISolone acetate 40 MG/ML Medications (Left): 3 mL lidocaine 1 %; 40 mg methylPREDNISolone acetate 40 MG/ML Aspirate (Left): 1 mL yellow Outcome: tolerated well, no immediate complications Procedure, treatment alternatives, risks and benefits explained, specific risks discussed. Consent was given by the patient. Immediately prior to procedure a time out was called to verify the correct patient, procedure, equipment, support staff and site/side marked as required. Patient was prepped and draped in the usual sterile fashion.    Plan: He will follow-up with [place] on a as needed basis knows to wait at least 3 months  between cortisone injections both knees.  Did ask about having sensation of things crawling on his skin.  I recommended that he follow-up with neurology as he was seen by neurology past.  Questions were encouraged and answered

## 2021-10-15 DIAGNOSIS — R262 Difficulty in walking, not elsewhere classified: Secondary | ICD-10-CM | POA: Diagnosis not present

## 2021-10-15 DIAGNOSIS — I509 Heart failure, unspecified: Secondary | ICD-10-CM | POA: Diagnosis not present

## 2021-10-27 ENCOUNTER — Other Ambulatory Visit: Payer: Self-pay | Admitting: Internal Medicine

## 2021-10-27 LAB — COMPLETE METABOLIC PANEL WITH GFR
AG Ratio: 0.9 (calc) — ABNORMAL LOW (ref 1.0–2.5)
ALT: 5 U/L — ABNORMAL LOW (ref 9–46)
AST: 12 U/L (ref 10–35)
Albumin: 3.4 g/dL — ABNORMAL LOW (ref 3.6–5.1)
Alkaline phosphatase (APISO): 91 U/L (ref 35–144)
BUN: 10 mg/dL (ref 7–25)
CO2: 26 mmol/L (ref 20–32)
Calcium: 9.2 mg/dL (ref 8.6–10.3)
Chloride: 103 mmol/L (ref 98–110)
Creat: 0.74 mg/dL (ref 0.70–1.35)
Globulin: 4 g/dL (calc) — ABNORMAL HIGH (ref 1.9–3.7)
Glucose, Bld: 70 mg/dL (ref 65–99)
Potassium: 3.9 mmol/L (ref 3.5–5.3)
Sodium: 137 mmol/L (ref 135–146)
Total Bilirubin: 0.3 mg/dL (ref 0.2–1.2)
Total Protein: 7.4 g/dL (ref 6.1–8.1)
eGFR: 103 mL/min/{1.73_m2} (ref >=60)

## 2021-10-27 LAB — VITAMIN B12: Vitamin B-12: 367 pg/mL (ref 200–1100)

## 2021-10-27 LAB — CBC
HCT: 39.5 % (ref 38.5–50.0)
Hemoglobin: 12.8 g/dL — ABNORMAL LOW (ref 13.2–17.1)
MCH: 29.3 pg (ref 27.0–33.0)
MCHC: 32.4 g/dL (ref 32.0–36.0)
MCV: 90.4 fL (ref 80.0–100.0)
MPV: 10.5 fL (ref 7.5–12.5)
Platelets: 287 10*3/uL (ref 140–400)
RBC: 4.37 10*6/uL (ref 4.20–5.80)
RDW: 13.2 % (ref 11.0–15.0)
WBC: 8.1 10*3/uL (ref 3.8–10.8)

## 2021-10-27 LAB — FOLATE: Folate: 21.9 ng/mL

## 2021-10-28 ENCOUNTER — Ambulatory Visit: Payer: Medicare Other | Admitting: Neurology

## 2021-11-16 ENCOUNTER — Ambulatory Visit: Payer: Medicare Other | Admitting: Physician Assistant

## 2021-11-25 ENCOUNTER — Ambulatory Visit: Payer: Medicare Other | Admitting: Physician Assistant

## 2021-11-26 ENCOUNTER — Emergency Department (HOSPITAL_COMMUNITY): Payer: Medicare Other

## 2021-11-26 ENCOUNTER — Other Ambulatory Visit: Payer: Self-pay

## 2021-11-26 ENCOUNTER — Emergency Department (HOSPITAL_COMMUNITY)
Admission: EM | Admit: 2021-11-26 | Discharge: 2021-11-26 | Disposition: A | Discharge status: Home / Self Care | Payer: Medicare Other | Attending: Emergency Medicine | Admitting: Emergency Medicine

## 2021-11-26 DIAGNOSIS — I5021 Acute systolic (congestive) heart failure: Secondary | ICD-10-CM | POA: Diagnosis not present

## 2021-11-26 DIAGNOSIS — F17210 Nicotine dependence, cigarettes, uncomplicated: Secondary | ICD-10-CM | POA: Insufficient documentation

## 2021-11-26 DIAGNOSIS — R569 Unspecified convulsions: Secondary | ICD-10-CM | POA: Diagnosis not present

## 2021-11-26 DIAGNOSIS — I130 Hypertensive heart and chronic kidney disease with heart failure and stage 1 through stage 4 chronic kidney disease, or unspecified chronic kidney disease: Secondary | ICD-10-CM | POA: Insufficient documentation

## 2021-11-26 DIAGNOSIS — R443 Hallucinations, unspecified: Secondary | ICD-10-CM | POA: Diagnosis not present

## 2021-11-26 DIAGNOSIS — Z7951 Long term (current) use of inhaled steroids: Secondary | ICD-10-CM | POA: Insufficient documentation

## 2021-11-26 DIAGNOSIS — R4182 Altered mental status, unspecified: Secondary | ICD-10-CM | POA: Insufficient documentation

## 2021-11-26 DIAGNOSIS — J45909 Unspecified asthma, uncomplicated: Secondary | ICD-10-CM | POA: Insufficient documentation

## 2021-11-26 DIAGNOSIS — Z7901 Long term (current) use of anticoagulants: Secondary | ICD-10-CM | POA: Insufficient documentation

## 2021-11-26 DIAGNOSIS — Z20822 Contact with and (suspected) exposure to covid-19: Secondary | ICD-10-CM | POA: Diagnosis not present

## 2021-11-26 DIAGNOSIS — Z79899 Other long term (current) drug therapy: Secondary | ICD-10-CM | POA: Diagnosis not present

## 2021-11-26 DIAGNOSIS — N183 Chronic kidney disease, stage 3 unspecified: Secondary | ICD-10-CM | POA: Diagnosis not present

## 2021-11-26 LAB — I-STAT VENOUS BLOOD GAS, ED
Acid-Base Excess: 3 mmol/L — ABNORMAL HIGH (ref 0.0–2.0)
Bicarbonate: 30.2 mmol/L — ABNORMAL HIGH (ref 20.0–28.0)
Calcium, Ion: 1.28 mmol/L (ref 1.15–1.40)
HCT: 41 % (ref 39.0–52.0)
Hemoglobin: 13.9 g/dL (ref 13.0–17.0)
O2 Saturation: 60 %
Potassium: 3.8 mmol/L (ref 3.5–5.1)
Sodium: 139 mmol/L (ref 135–145)
TCO2: 32 mmol/L (ref 22–32)
pCO2, Ven: 55.2 mmHg (ref 44.0–60.0)
pH, Ven: 7.346 (ref 7.250–7.430)
pO2, Ven: 33 mmHg (ref 32.0–45.0)

## 2021-11-26 LAB — CBC WITH DIFFERENTIAL/PLATELET
Abs Immature Granulocytes: 0.02 10*3/uL (ref 0.00–0.07)
Basophils Absolute: 0 10*3/uL (ref 0.0–0.1)
Basophils Relative: 0 %
Eosinophils Absolute: 0.3 10*3/uL (ref 0.0–0.5)
Eosinophils Relative: 4 %
HCT: 40.7 % (ref 39.0–52.0)
Hemoglobin: 12.8 g/dL — ABNORMAL LOW (ref 13.0–17.0)
Immature Granulocytes: 0 %
Lymphocytes Relative: 20 %
Lymphs Abs: 1.3 10*3/uL (ref 0.7–4.0)
MCH: 28 pg (ref 26.0–34.0)
MCHC: 31.4 g/dL (ref 30.0–36.0)
MCV: 89.1 fL (ref 80.0–100.0)
Monocytes Absolute: 1 10*3/uL (ref 0.1–1.0)
Monocytes Relative: 15 %
Neutro Abs: 4.1 10*3/uL (ref 1.7–7.7)
Neutrophils Relative %: 61 %
Platelets: 221 10*3/uL (ref 150–400)
RBC: 4.57 MIL/uL (ref 4.22–5.81)
RDW: 15 % (ref 11.5–15.5)
WBC: 6.8 10*3/uL (ref 4.0–10.5)
nRBC: 0 % (ref 0.0–0.2)

## 2021-11-26 LAB — COMPREHENSIVE METABOLIC PANEL
ALT: 9 U/L (ref 0–44)
AST: 15 U/L (ref 15–41)
Albumin: 3.1 g/dL — ABNORMAL LOW (ref 3.5–5.0)
Alkaline Phosphatase: 87 U/L (ref 38–126)
Anion gap: 8 (ref 5–15)
BUN: 9 mg/dL (ref 8–23)
CO2: 26 mmol/L (ref 22–32)
Calcium: 9.5 mg/dL (ref 8.9–10.3)
Chloride: 101 mmol/L (ref 98–111)
Creatinine, Ser: 0.86 mg/dL (ref 0.61–1.24)
GFR, Estimated: >60 mL/min (ref >60)
Glucose, Bld: 98 mg/dL (ref 70–99)
Potassium: 3.9 mmol/L (ref 3.5–5.1)
Sodium: 135 mmol/L (ref 135–145)
Total Bilirubin: 0.7 mg/dL (ref 0.3–1.2)
Total Protein: 7.5 g/dL (ref 6.5–8.1)

## 2021-11-26 LAB — PHOSPHORUS: Phosphorus: 3.7 mg/dL (ref 2.5–4.6)

## 2021-11-26 LAB — I-STAT CHEM 8, ED
BUN: 11 mg/dL (ref 8–23)
Calcium, Ion: 1.27 mmol/L (ref 1.15–1.40)
Chloride: 100 mmol/L (ref 98–111)
Creatinine, Ser: 0.9 mg/dL (ref 0.61–1.24)
Glucose, Bld: 94 mg/dL (ref 70–99)
HCT: 44 % (ref 39.0–52.0)
Hemoglobin: 15 g/dL (ref 13.0–17.0)
Potassium: 4 mmol/L (ref 3.5–5.1)
Sodium: 139 mmol/L (ref 135–145)
TCO2: 30 mmol/L (ref 22–32)

## 2021-11-26 LAB — LACTIC ACID, PLASMA: Lactic Acid, Venous: 0.8 mmol/L (ref 0.5–1.9)

## 2021-11-26 LAB — URINALYSIS, ROUTINE W REFLEX MICROSCOPIC
Bilirubin Urine: NEGATIVE
Glucose, UA: NEGATIVE mg/dL
Hgb urine dipstick: NEGATIVE
Ketones, ur: NEGATIVE mg/dL
Leukocytes,Ua: NEGATIVE
Nitrite: NEGATIVE
Protein, ur: NEGATIVE mg/dL
Specific Gravity, Urine: 1.015 (ref 1.005–1.030)
pH: 5 (ref 5.0–8.0)

## 2021-11-26 LAB — RAPID URINE DRUG SCREEN, HOSP PERFORMED
Amphetamines: NOT DETECTED
Barbiturates: NOT DETECTED
Benzodiazepines: NOT DETECTED
Cocaine: NOT DETECTED
Opiates: NOT DETECTED
Tetrahydrocannabinol: NOT DETECTED

## 2021-11-26 LAB — RESP PANEL BY RT-PCR (FLU A&B, COVID) ARPGX2
Influenza A by PCR: NEGATIVE
Influenza B by PCR: NEGATIVE
SARS Coronavirus 2 by RT PCR: NEGATIVE

## 2021-11-26 LAB — SALICYLATE LEVEL: Salicylate Lvl: <7 mg/dL — ABNORMAL LOW (ref 7.0–30.0)

## 2021-11-26 LAB — ETHANOL: Alcohol, Ethyl (B): <10 mg/dL (ref <10)

## 2021-11-26 LAB — MAGNESIUM: Magnesium: 1.6 mg/dL — ABNORMAL LOW (ref 1.7–2.4)

## 2021-11-26 LAB — ACETAMINOPHEN LEVEL: Acetaminophen (Tylenol), Serum: <10 ug/mL — ABNORMAL LOW (ref 10–30)

## 2021-11-26 MED ORDER — LEVETIRACETAM 250 MG PO TABS: 250.0000 mg | ORAL_TABLET | Freq: Two times a day (BID) | ORAL | 0 refills | Status: DC

## 2021-11-26 NOTE — ED Provider Notes (Signed)
[facility] EMERGENCY DEPARTMENT Provider Note   CSN: [id number] Arrival date & time: 11/26/21  1014     History Chief Complaint  Patient presents with   Seizures   Altered Mental Status   Hallucinations    Frederick [name] is a 60 y.o. male.  HPI Patient reports he had a seizure-like episode at home.  He reports he was having movement of the left upper extremity that he was not controlling.  He reports he was awake and aware of the event.  He is unsure exactly how long the symptoms persisted.  But reports once it resolved he went back to normal.  At this time he reports he feels fine and has no areas of focal weakness numbness or tingling.  Patient reports he had a similar episode of seizure and was hospitalized but it was thought at that time it was due to alcohol withdrawal.  Patient reports he has not had alcohol for almost 5 months.  He denies there is been any more recent drinking and he also reports he has avoided all drugs.  Patient does report he is taking his Keppra regularly as prescribed.  He denies he is felt unwell with fevers chills body aches or headaches.  Was report from EMS that the patient's wife, who is not present in the emergency department, ported the patient had a seizure yesterday evening and then had hallucinations and confusion through much of the night.    Past Medical History:  Diagnosis Date   Arthritis    Asthma    Bilateral lower extremity edema 05/28/2021   propping feet up   Cough 05/28/2021   with clear sputum   Dyspnea    Dyspnea 05/28/2021   with exertion   EtOH dependence (HCC)    drinks 2-3 of 40ounes a day   Fibula fracture    Dr Mardelle Matte 02-2011   Finger osteomyelitis, right ([place]) 05/26/2021   right index finger   Hernia    surgery 01-17-13   Hypertension    not taking bp meds since may 2022   Streptococcal bacteremia 09/09/2021   Tuberculosis    couple of yrs ago took tx at [place] per pt on  05-28-2021    Patient Active Problem List   Diagnosis Date Noted   Streptococcal bacteremia 09/09/2021   Alcohol withdrawal seizure with complication, with unspecified complication ([place]) [id number]   CKD (chronic kidney disease), stage III ([place]) 08/19/2021   Dysphagia 08/19/2021   Malnutrition of moderate degree 08/12/2021   Bacteremia due to group B Streptococcus    Acute systolic CHF (congestive heart failure) ([place])    Seizures ([place]) 08/10/2021   Status epilepticus ([place])    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   Osteomyelitis of index finger of right hand ([place]) 05/28/2021   Marijuana abuse 07/22/2017   Hyponatremia 12/03/2015   Left rib fracture 11/29/2015   Microcytic anemia 11/29/2015   Cigarette smoker 11/29/2015   Moderate protein-calorie malnutrition ([place]) 11/29/2015   Unintentional weight loss 11/29/2015   History of tuberculosis 11/28/2015   Cavitary lesion of lung 11/28/2015   SBO (small bowel obstruction) ([place]) 03/10/2013   Abdominal pain, diffuse 03/10/2013   Nausea and vomiting 03/10/2013   Fracture of fibula, proximal 01/21/2013   Closed fracture of left distal distal tibia 01/21/2013   EtOH dependence ([place])    Fibula fracture    Hernia     Past Surgical History:  Procedure Laterality Date  AMPUTATION Right 05/29/2021   Procedure: PARTIAL RIGHT 2ND FINGER AMPUTATION;  Surgeon: Mcarthur Rossetti, MD;  Location: [facility];  Service: Orthopedics;  Laterality: Right;   ANKLE SURGERY     INGUINAL HERNIA REPAIR Right 01/19/2013   Procedure: HERNIA REPAIR INGUINAL ADULT;  Surgeon: Gayland Curry, MD,FACS;  Location: [place];  Service: General;  Laterality: Right;   TEE WITHOUT CARDIOVERSION N/A 08/14/2021   Procedure: TRANSESOPHAGEAL ECHOCARDIOGRAM (TEE);  Surgeon: Josue Hector, MD;  Location: [facility] ENDOSCOPY;  Service: Cardiovascular;  Laterality: N/A;   TIBIA IM NAIL INSERTION Left 01/22/2013   Procedure:  INTRAMEDULLARY (IM) NAIL TIBIAL;  Surgeon: Johnny Bridge, MD;  Location: [place];  Service: Orthopedics;  Laterality: Left;       Family History  Problem Relation Age of Onset   Diabetes Mother    Diabetes Father    Hypertension Father     Social History   Tobacco Use   Smoking status: Every Day    Packs/day: 0.50    Types: Cigarettes   Smokeless tobacco: Never   Tobacco comments:    1 ppd  Vaping Use   Vaping Use: Never used  Substance Use Topics   Alcohol use: Yes    Comment: 1 beer this am per pt on 05-28-2021   Drug use: No    Home Medications Prior to Admission medications   Medication Sig Start Date End Date Taking? Authorizing Provider  albuterol (VENTOLIN HFA) 108 (90 Base) MCG/ACT inhaler Inhale 2 puffs into the lungs every 4 (four) hours as needed. 12/06/19  Yes [provider]  apixaban (ELIQUIS) 5 MG TABS tablet Take 2 tablets (10 mg total) by mouth 2 (two) times daily for 5 days. 08/28/21 01/16/22 Yes Regalado, Belkys A, MD  carvedilol (COREG) 6.25 MG tablet Take 1 tablet (6.25 mg total) by mouth 2 (two) times daily with a meal. 08/28/21  Yes Regalado, Belkys A, MD  diclofenac Sodium (VOLTAREN) 1 % GEL Apply 4 g topically 4 (four) times daily as needed for pain. 10/26/21  Yes [provider]  doxycycline (VIBRA-TABS) 100 MG tablet Take 100 mg by mouth 2 (two) times daily. 10/26/21  Yes [provider]  folic acid (FOLVITE) 1 MG tablet Take 1 tablet (1 mg total) by mouth daily. 08/29/21  Yes Regalado, Belkys A, MD  gabapentin (NEURONTIN) 100 MG capsule Take 100 mg by mouth 2 (two) times daily. 10/26/21  Yes [provider]  levETIRAcetam (KEPPRA) 1000 MG tablet Take 1 tablet (1,000 mg total) by mouth 2 (two) times daily. 08/28/21  Yes Regalado, Belkys A, MD  levETIRAcetam (KEPPRA) 250 MG tablet Take 1 tablet (250 mg total) by mouth 2 (two) times daily. Take the 250 mg tablet with your 1000 mg tablet for total of 1250 mg twice daily.  11/26/21  Yes Charlesetta Shanks, MD  magnesium oxide (MAG-OX) 400 (240 Mg) MG tablet Take 0.5 tablets (200 mg total) by mouth 2 (two) times daily. 08/28/21  Yes Regalado, Belkys A, MD  sildenafil (VIAGRA) 100 MG tablet Take 100 mg by mouth daily as needed for erectile dysfunction. 10/31/21  Yes [provider]  feeding supplement (ENSURE ENLIVE / ENSURE PLUS) LIQD Take 237 mLs by mouth 3 (three) times daily between meals. Patient not taking: Reported on 09/09/2021 08/28/21   Regalado, Jerald Kief A, MD  ipratropium-albuterol (DUONEB) 0.5-2.5 (3) MG/3ML SOLN Take 3 mLs by nebulization every 6 (six) hours as needed. Patient not taking: Reported on 09/09/2021 08/28/21  Regalado, Belkys A, MD  Multiple Vitamin (MULTIVITAMIN WITH MINERALS) TABS tablet Take 1 tablet by mouth daily. Patient not taking: Reported on 11/26/2021 08/28/21   Regalado, Jerald Kief A, MD  nicotine (NICODERM CQ - DOSED IN MG/24 HOURS) 21 mg/24hr patch Place 1 patch (21 mg total) onto the skin daily. Patient not taking: Reported on 09/09/2021 08/29/21   Regalado, Jerald Kief A, MD  saccharomyces boulardii (FLORASTOR) 250 MG capsule Take 1 capsule (250 mg total) by mouth 2 (two) times daily. Patient not taking: Reported on 09/09/2021 08/28/21   Niel Hummer A, MD  thiamine 100 MG tablet Take 1 tablet (100 mg total) by mouth daily. Patient not taking: Reported on 09/09/2021 08/29/21   Niel Hummer A, MD  vitamin B-12 100 MCG tablet Take 1 tablet (100 mcg total) by mouth daily. Patient not taking: Reported on 09/09/2021 08/29/21   Niel Hummer A, MD  ferrous sulfate 325 (65 FE) MG tablet Take 1 tablet (325 mg total) by mouth 2 (two) times daily with a meal. Patient not taking: Reported on 02/12/2020 12/03/15 04/17/20  Janece Canterbury, MD  zolpidem (AMBIEN) 5 MG tablet Take 1 tablet (5 mg total) by mouth at bedtime as needed for sleep. Patient not taking: Reported on 02/12/2020 12/02/15 04/17/20  Hosie Poisson, MD    Allergies    Patient has  no known allergies.  Review of Systems   Review of Systems 10 systems reviewed and negative except as per HPI Physical Exam Updated Vital Signs BP (!) 147/122    Pulse 90    Temp 98.1 F (36.7 C)    Resp 19    Ht 6\' 1"  (1.854 m)    Wt 77.1 kg    SpO2 97%    BMI 22.43 kg/m   Physical Exam Constitutional:      Comments: Patient is alert.  No acute distress.  He is pleasant and interactive.  HENT:     Nose: Nose normal.     Mouth/Throat:     Mouth: Mucous membranes are moist.     Pharynx: Oropharynx is clear.  Eyes:     Extraocular Movements: Extraocular movements intact.     Pupils: Pupils are equal, round, and reactive to light.  Cardiovascular:     Rate and Rhythm: Normal rate and regular rhythm.  Pulmonary:     Effort: Pulmonary effort is normal.     Breath sounds: Normal breath sounds.  Abdominal:     General: There is no distension.     Palpations: Abdomen is soft.     Tenderness: There is no abdominal tenderness. There is no guarding.  Musculoskeletal:        General: Normal range of motion.  Skin:    General: Skin is warm and dry.  Neurological:     Comments: Patient is alert.  He is oriented to person and place.  No expressive or receptive aphasia.  Patient follows commands for grip strength 5\5 bilaterally.  Lower extremity strength testing 5\5.  Patient can perform finger-nose exam bilaterally with a little bit of intention tremor at the end.  Psychiatric:        Mood and Affect: Mood normal.    ED Results / Procedures / Treatments   Labs (all labs ordered are listed, but only abnormal results are displayed) Labs Reviewed  COMPREHENSIVE METABOLIC PANEL - Abnormal; Notable for the following components:      Result Value   Albumin 3.1 (*)    All other components within normal limits  CBC WITH DIFFERENTIAL/PLATELET - Abnormal; Notable for the following components:   Hemoglobin 12.8 (*)    All other components within normal limits  SALICYLATE LEVEL - Abnormal;  Notable for the following components:   Salicylate Lvl <7.0 (*)    All other components within normal limits  ACETAMINOPHEN LEVEL - Abnormal; Notable for the following components:   Acetaminophen (Tylenol), Serum <10 (*)    All other components within normal limits  MAGNESIUM - Abnormal; Notable for the following components:   Magnesium 1.6 (*)    All other components within normal limits  I-STAT VENOUS BLOOD GAS, ED - Abnormal; Notable for the following components:   Bicarbonate 30.2 (*)    Acid-Base Excess 3.0 (*)    All other components within normal limits  RESP PANEL BY RT-PCR (FLU A&B, COVID) [id number]  ETHANOL  LACTIC ACID, PLASMA  URINALYSIS, ROUTINE W REFLEX MICROSCOPIC  RAPID URINE DRUG SCREEN, HOSP PERFORMED  PHOSPHORUS  I-STAT CHEM 8, ED    EKG EKG Interpretation  Date/Time:  Thursday November 26 2021 10:29:00 EST Ventricular Rate:  75 PR Interval:  167 QRS Duration: 79 QT Interval:  439 QTC Calculation: 491 R Axis:   60 Text Interpretation: Sinus rhythm Borderline prolonged QT interval t waves more acute compared to previous Confirmed by Arby Barrette [phone]) on 11/26/2021 10:31:19 AM  Radiology CT Head Wo Contrast  Result Date: 11/26/2021 CLINICAL DATA:  Mental status changes, visual hallucinations EXAM: CT HEAD WITHOUT CONTRAST TECHNIQUE: Contiguous axial images were obtained from the base of the skull through the vertex without intravenous contrast. COMPARISON:  08/10/2021 FINDINGS: Brain: There is atrophy and chronic small vessel disease changes. No acute intracranial abnormality. Specifically, no hemorrhage, hydrocephalus, mass lesion, acute infarction, or significant intracranial injury. Vascular: No hyperdense vessel or unexpected calcification. Skull: No acute calvarial abnormality. Sinuses/Orbits: No acute findings Other: None IMPRESSION: Atrophy, chronic microvascular disease. No acute intracranial abnormality. Electronically Signed   By: Charlett Nose M.D.    On: 11/26/2021 10:49   DG Chest Port 1 View  Result Date: 11/26/2021 CLINICAL DATA:  Altered mental status EXAM: PORTABLE CHEST 1 VIEW COMPARISON:  08/11/2021 FINDINGS: Chronic changes throughout the right lung. Stable cavitary/bullous changes at the right apex. No confluent opacity on the left. Heart is normal size. No effusions or acute bony abnormality. IMPRESSION: Stable chronic changes throughout the right lung. No active disease. Electronically Signed   By: Charlett Nose M.D.   On: 11/26/2021 10:44    Procedures Procedures   Medications Ordered in ED Medications - No data to display  ED Course  I have reviewed the triage vital signs and the nursing notes.  Pertinent labs & imaging results that were available during my care of the patient were reviewed by me and considered in my medical decision making (see chart for details).    MDM Rules/Calculators/A&P Patient presents as outlined.  He describes a episode of possible partial seizure.  He describes being awake and aware of unilateral extremity movement.  Notes from EMS indicate patient have more of a picture of confusion yesterday.  During today's encounter, patient is not exhibiting significant confusion.  He is following commands appropriately.  There are no focal motor deficits.  Review of EMR indicates prior seizure have been attributed to alcohol withdrawal.  Patient has reiterated several times that he has not been drinking since that time and is negative for alcohol currently.  Objectively patient does not appear to be in acute withdrawal.  Diagnostic work-up does  not suggest an infectious etiology.  At this time plan will be to increase Keppra dosing to 1250 twice daily and I recommend follow-up with neurology as soon as possible.  Referral given for [facility] neurology.  Careful return precautions reviewed.   Final Clinical Impression(s) / ED Diagnoses Final diagnoses:  Seizure-like activity ([place])    Rx / DC Orders ED  Discharge Orders          Ordered    levETIRAcetam (KEPPRA) 250 MG tablet  2 times daily        11/26/21 1528             Charlesetta Shanks, MD 11/26/21 1554

## 2021-11-26 NOTE — ED Triage Notes (Signed)
Pt arrives via EMS from home for AMS and visual hallucinations after having a seizure last night. Per EMS pt wife said that he had a seizure last night. He stayed up all night "talking out of his head." Wife reported that pt saw things crawling on his hands and saw himself in prison. AxOx3 at this time. Pt states that it is November 2003. Takes keppra, no missed doses per pt.

## 2021-11-26 NOTE — Discharge Instructions (Addendum)
1.  Increase your Keppra dose to 1250 mg twice daily.  I have added a 250 mg tablet that you can take with your 1000 mg tablet twice daily 2.  You should be seen by a neurologist in follow-up.  You have been given a referral to [facility] neurology.  Call their office to schedule an appointment as soon as possible. 3.  Try to get good rest, eat nutritious and regular meals, continue to avoid any alcohol and drugs, return to the emergency department if you have recurrence of symptoms, new symptoms or other concerning symptoms.

## 2021-12-14 ENCOUNTER — Ambulatory Visit: Payer: Medicare Other | Admitting: Neurology

## 2021-12-14 ENCOUNTER — Ambulatory Visit: Payer: Medicare Other | Admitting: Physician Assistant

## 2021-12-15 ENCOUNTER — Encounter: Payer: Self-pay | Admitting: Neurology

## 2022-01-05 ENCOUNTER — Encounter (HOSPITAL_COMMUNITY): Payer: Self-pay

## 2022-01-05 ENCOUNTER — Inpatient Hospital Stay (HOSPITAL_COMMUNITY): Payer: Medicare Other

## 2022-01-05 ENCOUNTER — Emergency Department (HOSPITAL_COMMUNITY): Payer: Medicare Other

## 2022-01-05 ENCOUNTER — Other Ambulatory Visit: Payer: Self-pay

## 2022-01-05 ENCOUNTER — Inpatient Hospital Stay (HOSPITAL_COMMUNITY)
Admission: EM | Admit: 2022-01-05 | Discharge: 2022-01-22 | DRG: 100 | Disposition: A | Discharge status: Skilled Nursing Facility | Payer: Medicare Other | Attending: Internal Medicine | Admitting: Internal Medicine

## 2022-01-05 DIAGNOSIS — K219 Gastro-esophageal reflux disease without esophagitis: Secondary | ICD-10-CM | POA: Diagnosis present

## 2022-01-05 DIAGNOSIS — E43 Unspecified severe protein-calorie malnutrition: Secondary | ICD-10-CM | POA: Diagnosis not present

## 2022-01-05 DIAGNOSIS — J9601 Acute respiratory failure with hypoxia: Secondary | ICD-10-CM | POA: Diagnosis not present

## 2022-01-05 DIAGNOSIS — L89890 Pressure ulcer of other site, unstageable: Secondary | ICD-10-CM | POA: Diagnosis not present

## 2022-01-05 DIAGNOSIS — I130 Hypertensive heart and chronic kidney disease with heart failure and stage 1 through stage 4 chronic kidney disease, or unspecified chronic kidney disease: Secondary | ICD-10-CM | POA: Diagnosis not present

## 2022-01-05 DIAGNOSIS — G9389 Other specified disorders of brain: Secondary | ICD-10-CM | POA: Diagnosis not present

## 2022-01-05 DIAGNOSIS — F1010 Alcohol abuse, uncomplicated: Secondary | ICD-10-CM | POA: Diagnosis not present

## 2022-01-05 DIAGNOSIS — Z79899 Other long term (current) drug therapy: Secondary | ICD-10-CM

## 2022-01-05 DIAGNOSIS — D509 Iron deficiency anemia, unspecified: Secondary | ICD-10-CM | POA: Diagnosis not present

## 2022-01-05 DIAGNOSIS — Z743 Need for continuous supervision: Secondary | ICD-10-CM | POA: Diagnosis not present

## 2022-01-05 DIAGNOSIS — I499 Cardiac arrhythmia, unspecified: Secondary | ICD-10-CM | POA: Diagnosis not present

## 2022-01-05 DIAGNOSIS — R911 Solitary pulmonary nodule: Secondary | ICD-10-CM | POA: Diagnosis not present

## 2022-01-05 DIAGNOSIS — Z9114 Patient's other noncompliance with medication regimen: Secondary | ICD-10-CM

## 2022-01-05 DIAGNOSIS — Z01818 Encounter for other preprocedural examination: Secondary | ICD-10-CM | POA: Diagnosis not present

## 2022-01-05 DIAGNOSIS — Z6822 Body mass index (BMI) 22.0-22.9, adult: Secondary | ICD-10-CM

## 2022-01-05 DIAGNOSIS — Z7901 Long term (current) use of anticoagulants: Secondary | ICD-10-CM

## 2022-01-05 DIAGNOSIS — E871 Hypo-osmolality and hyponatremia: Secondary | ICD-10-CM | POA: Diagnosis present

## 2022-01-05 DIAGNOSIS — J479 Bronchiectasis, uncomplicated: Secondary | ICD-10-CM | POA: Diagnosis present

## 2022-01-05 DIAGNOSIS — F1029 Alcohol dependence with unspecified alcohol-induced disorder: Secondary | ICD-10-CM | POA: Diagnosis not present

## 2022-01-05 DIAGNOSIS — G40901 Epilepsy, unspecified, not intractable, with status epilepticus: Principal | ICD-10-CM

## 2022-01-05 DIAGNOSIS — N183 Chronic kidney disease, stage 3 unspecified: Secondary | ICD-10-CM | POA: Diagnosis not present

## 2022-01-05 DIAGNOSIS — E785 Hyperlipidemia, unspecified: Secondary | ICD-10-CM | POA: Diagnosis present

## 2022-01-05 DIAGNOSIS — G9341 Metabolic encephalopathy: Secondary | ICD-10-CM

## 2022-01-05 DIAGNOSIS — Z781 Physical restraint status: Secondary | ICD-10-CM

## 2022-01-05 DIAGNOSIS — I6523 Occlusion and stenosis of bilateral carotid arteries: Secondary | ICD-10-CM | POA: Diagnosis not present

## 2022-01-05 DIAGNOSIS — U071 COVID-19: Secondary | ICD-10-CM | POA: Diagnosis present

## 2022-01-05 DIAGNOSIS — I6330 Cerebral infarction due to thrombosis of unspecified cerebral artery: Secondary | ICD-10-CM | POA: Diagnosis not present

## 2022-01-05 DIAGNOSIS — R1314 Dysphagia, pharyngoesophageal phase: Secondary | ICD-10-CM | POA: Diagnosis present

## 2022-01-05 DIAGNOSIS — R404 Transient alteration of awareness: Secondary | ICD-10-CM | POA: Diagnosis not present

## 2022-01-05 DIAGNOSIS — I69822 Dysarthria following other cerebrovascular disease: Secondary | ICD-10-CM | POA: Diagnosis not present

## 2022-01-05 DIAGNOSIS — R531 Weakness: Secondary | ICD-10-CM | POA: Diagnosis not present

## 2022-01-05 DIAGNOSIS — Z8616 Personal history of COVID-19: Secondary | ICD-10-CM | POA: Diagnosis not present

## 2022-01-05 DIAGNOSIS — R270 Ataxia, unspecified: Secondary | ICD-10-CM | POA: Diagnosis not present

## 2022-01-05 DIAGNOSIS — Z1159 Encounter for screening for other viral diseases: Secondary | ICD-10-CM | POA: Diagnosis not present

## 2022-01-05 DIAGNOSIS — R471 Dysarthria and anarthria: Secondary | ICD-10-CM | POA: Diagnosis not present

## 2022-01-05 DIAGNOSIS — Z8249 Family history of ischemic heart disease and other diseases of the circulatory system: Secondary | ICD-10-CM

## 2022-01-05 DIAGNOSIS — E538 Deficiency of other specified B group vitamins: Secondary | ICD-10-CM | POA: Diagnosis present

## 2022-01-05 DIAGNOSIS — I69891 Dysphagia following other cerebrovascular disease: Secondary | ICD-10-CM | POA: Diagnosis not present

## 2022-01-05 DIAGNOSIS — R509 Fever, unspecified: Secondary | ICD-10-CM

## 2022-01-05 DIAGNOSIS — I69811 Memory deficit following other cerebrovascular disease: Secondary | ICD-10-CM | POA: Diagnosis not present

## 2022-01-05 DIAGNOSIS — E875 Hyperkalemia: Secondary | ICD-10-CM | POA: Diagnosis not present

## 2022-01-05 DIAGNOSIS — J984 Other disorders of lung: Secondary | ICD-10-CM | POA: Diagnosis not present

## 2022-01-05 DIAGNOSIS — I5020 Unspecified systolic (congestive) heart failure: Secondary | ICD-10-CM | POA: Diagnosis not present

## 2022-01-05 DIAGNOSIS — R29898 Other symptoms and signs involving the musculoskeletal system: Secondary | ICD-10-CM

## 2022-01-05 DIAGNOSIS — R6889 Other general symptoms and signs: Secondary | ICD-10-CM | POA: Diagnosis not present

## 2022-01-05 DIAGNOSIS — I739 Peripheral vascular disease, unspecified: Secondary | ICD-10-CM | POA: Diagnosis not present

## 2022-01-05 DIAGNOSIS — R4020 Unspecified coma: Secondary | ICD-10-CM | POA: Diagnosis not present

## 2022-01-05 DIAGNOSIS — J45909 Unspecified asthma, uncomplicated: Secondary | ICD-10-CM | POA: Diagnosis present

## 2022-01-05 DIAGNOSIS — F1020 Alcohol dependence, uncomplicated: Secondary | ICD-10-CM | POA: Diagnosis present

## 2022-01-05 DIAGNOSIS — R739 Hyperglycemia, unspecified: Secondary | ICD-10-CM | POA: Diagnosis not present

## 2022-01-05 DIAGNOSIS — I639 Cerebral infarction, unspecified: Secondary | ICD-10-CM | POA: Diagnosis not present

## 2022-01-05 DIAGNOSIS — T426X6A Underdosing of other antiepileptic and sedative-hypnotic drugs, initial encounter: Secondary | ICD-10-CM | POA: Diagnosis present

## 2022-01-05 DIAGNOSIS — R918 Other nonspecific abnormal finding of lung field: Secondary | ICD-10-CM | POA: Diagnosis not present

## 2022-01-05 DIAGNOSIS — E162 Hypoglycemia, unspecified: Secondary | ICD-10-CM | POA: Diagnosis not present

## 2022-01-05 DIAGNOSIS — I5022 Chronic systolic (congestive) heart failure: Secondary | ICD-10-CM | POA: Diagnosis present

## 2022-01-05 DIAGNOSIS — Z7401 Bed confinement status: Secondary | ICD-10-CM | POA: Diagnosis not present

## 2022-01-05 DIAGNOSIS — Z86718 Personal history of other venous thrombosis and embolism: Secondary | ICD-10-CM | POA: Diagnosis not present

## 2022-01-05 DIAGNOSIS — I672 Cerebral atherosclerosis: Secondary | ICD-10-CM | POA: Diagnosis not present

## 2022-01-05 DIAGNOSIS — Z4659 Encounter for fitting and adjustment of other gastrointestinal appliance and device: Secondary | ICD-10-CM

## 2022-01-05 DIAGNOSIS — F17210 Nicotine dependence, cigarettes, uncomplicated: Secondary | ICD-10-CM | POA: Diagnosis present

## 2022-01-05 DIAGNOSIS — M170 Bilateral primary osteoarthritis of knee: Secondary | ICD-10-CM | POA: Diagnosis not present

## 2022-01-05 DIAGNOSIS — R262 Difficulty in walking, not elsewhere classified: Secondary | ICD-10-CM | POA: Diagnosis not present

## 2022-01-05 DIAGNOSIS — I63543 Cerebral infarction due to unspecified occlusion or stenosis of bilateral cerebellar arteries: Secondary | ICD-10-CM | POA: Diagnosis not present

## 2022-01-05 DIAGNOSIS — J90 Pleural effusion, not elsewhere classified: Secondary | ICD-10-CM | POA: Diagnosis not present

## 2022-01-05 DIAGNOSIS — E8809 Other disorders of plasma-protein metabolism, not elsewhere classified: Secondary | ICD-10-CM | POA: Diagnosis not present

## 2022-01-05 DIAGNOSIS — J150 Pneumonia due to Klebsiella pneumoniae: Secondary | ICD-10-CM | POA: Diagnosis not present

## 2022-01-05 DIAGNOSIS — F05 Delirium due to known physiological condition: Secondary | ICD-10-CM | POA: Diagnosis not present

## 2022-01-05 DIAGNOSIS — Z8611 Personal history of tuberculosis: Secondary | ICD-10-CM

## 2022-01-05 DIAGNOSIS — M6281 Muscle weakness (generalized): Secondary | ICD-10-CM | POA: Diagnosis not present

## 2022-01-05 DIAGNOSIS — R569 Unspecified convulsions: Secondary | ICD-10-CM | POA: Diagnosis not present

## 2022-01-05 DIAGNOSIS — I700 Atherosclerosis of aorta: Secondary | ICD-10-CM | POA: Diagnosis not present

## 2022-01-05 DIAGNOSIS — Z4682 Encounter for fitting and adjustment of non-vascular catheter: Secondary | ICD-10-CM | POA: Diagnosis not present

## 2022-01-05 DIAGNOSIS — I509 Heart failure, unspecified: Secondary | ICD-10-CM | POA: Diagnosis not present

## 2022-01-05 DIAGNOSIS — I10 Essential (primary) hypertension: Secondary | ICD-10-CM | POA: Diagnosis not present

## 2022-01-05 DIAGNOSIS — I6389 Other cerebral infarction: Secondary | ICD-10-CM | POA: Diagnosis not present

## 2022-01-05 HISTORY — DX: Unspecified convulsions: R56.9

## 2022-01-05 LAB — I-STAT ARTERIAL BLOOD GAS, ED
Acid-Base Excess: 8 mmol/L — ABNORMAL HIGH (ref 0.0–2.0)
Bicarbonate: 33.4 mmol/L — ABNORMAL HIGH (ref 20.0–28.0)
Calcium, Ion: 1.23 mmol/L (ref 1.15–1.40)
HCT: 40 % (ref 39.0–52.0)
Hemoglobin: 13.6 g/dL (ref 13.0–17.0)
O2 Saturation: 100 %
Patient temperature: 98.6
Potassium: 3.8 mmol/L (ref 3.5–5.1)
Sodium: 137 mmol/L (ref 135–145)
TCO2: 35 mmol/L — ABNORMAL HIGH (ref 22–32)
pCO2 arterial: 48.2 mmHg — ABNORMAL HIGH (ref 32.0–48.0)
pH, Arterial: 7.448 (ref 7.350–7.450)
pO2, Arterial: 576 mmHg — ABNORMAL HIGH (ref 83.0–108.0)

## 2022-01-05 LAB — CBC WITH DIFFERENTIAL/PLATELET
Abs Immature Granulocytes: 0.02 10*3/uL (ref 0.00–0.07)
Basophils Absolute: 0 10*3/uL (ref 0.0–0.1)
Basophils Relative: 1 %
Eosinophils Absolute: 0.3 10*3/uL (ref 0.0–0.5)
Eosinophils Relative: 4 %
HCT: 41.5 % (ref 39.0–52.0)
Hemoglobin: 13.1 g/dL (ref 13.0–17.0)
Immature Granulocytes: 0 %
Lymphocytes Relative: 16 %
Lymphs Abs: 1 10*3/uL (ref 0.7–4.0)
MCH: 27.8 pg (ref 26.0–34.0)
MCHC: 31.6 g/dL (ref 30.0–36.0)
MCV: 87.9 fL (ref 80.0–100.0)
Monocytes Absolute: 1 10*3/uL (ref 0.1–1.0)
Monocytes Relative: 16 %
Neutro Abs: 3.9 10*3/uL (ref 1.7–7.7)
Neutrophils Relative %: 63 %
Platelets: 236 10*3/uL (ref 150–400)
RBC: 4.72 MIL/uL (ref 4.22–5.81)
RDW: 15.6 % — ABNORMAL HIGH (ref 11.5–15.5)
WBC: 6.1 10*3/uL (ref 4.0–10.5)
nRBC: 0 % (ref 0.0–0.2)

## 2022-01-05 LAB — I-STAT CHEM 8, ED
BUN: 7 mg/dL — ABNORMAL LOW (ref 8–23)
Calcium, Ion: 1.22 mmol/L (ref 1.15–1.40)
Chloride: 98 mmol/L (ref 98–111)
Creatinine, Ser: 0.8 mg/dL (ref 0.61–1.24)
Glucose, Bld: 98 mg/dL (ref 70–99)
HCT: 43 % (ref 39.0–52.0)
Hemoglobin: 14.6 g/dL (ref 13.0–17.0)
Potassium: 3.9 mmol/L (ref 3.5–5.1)
Sodium: 138 mmol/L (ref 135–145)
TCO2: 28 mmol/L (ref 22–32)

## 2022-01-05 LAB — HEMOGLOBIN A1C
Hgb A1c MFr Bld: 5.5 % (ref 4.8–5.6)
Mean Plasma Glucose: 111.15 mg/dL

## 2022-01-05 LAB — CBG MONITORING, ED
Glucose-Capillary: 142 mg/dL — ABNORMAL HIGH (ref 70–99)
Glucose-Capillary: 64 mg/dL — ABNORMAL LOW (ref 70–99)
Glucose-Capillary: 184 mg/dL — ABNORMAL HIGH (ref 70–99)
Glucose-Capillary: 53 mg/dL — ABNORMAL LOW (ref 70–99)

## 2022-01-05 LAB — COMPREHENSIVE METABOLIC PANEL
ALT: 8 U/L (ref 0–44)
AST: 17 U/L (ref 15–41)
Albumin: 3 g/dL — ABNORMAL LOW (ref 3.5–5.0)
Alkaline Phosphatase: 103 U/L (ref 38–126)
Anion gap: 7 (ref 5–15)
BUN: 7 mg/dL — ABNORMAL LOW (ref 8–23)
CO2: 28 mmol/L (ref 22–32)
Calcium: 9.1 mg/dL (ref 8.9–10.3)
Chloride: 100 mmol/L (ref 98–111)
Creatinine, Ser: 0.95 mg/dL (ref 0.61–1.24)
GFR, Estimated: >60 mL/min (ref >60)
Glucose, Bld: 100 mg/dL — ABNORMAL HIGH (ref 70–99)
Potassium: 3.8 mmol/L (ref 3.5–5.1)
Sodium: 135 mmol/L (ref 135–145)
Total Bilirubin: 0.6 mg/dL (ref 0.3–1.2)
Total Protein: 7.6 g/dL (ref 6.5–8.1)

## 2022-01-05 LAB — ETHANOL: Alcohol, Ethyl (B): <10 mg/dL (ref <10)

## 2022-01-05 LAB — RESP PANEL BY RT-PCR (FLU A&B, COVID) ARPGX2
Influenza A by PCR: NEGATIVE
Influenza B by PCR: NEGATIVE
SARS Coronavirus 2 by RT PCR: POSITIVE — AB

## 2022-01-05 LAB — MRSA NEXT GEN BY PCR, NASAL: MRSA by PCR Next Gen: NOT DETECTED

## 2022-01-05 LAB — GLUCOSE, CAPILLARY: Glucose-Capillary: 130 mg/dL — ABNORMAL HIGH (ref 70–99)

## 2022-01-05 MED ORDER — LEVETIRACETAM IN NACL 1500 MG/100ML IV SOLN
1500.0000 mg | Freq: Once | INTRAVENOUS | Status: AC
  Administered 2022-01-05: 1500 mg via INTRAVENOUS
  Filled 2022-01-05: qty 100

## 2022-01-05 MED ORDER — INSULIN ASPART 100 UNIT/ML IJ SOLN
0.0000 [IU] | INTRAMUSCULAR | Status: DC
  Administered 2022-01-07 – 2022-01-10 (×5): 1 [IU] via SUBCUTANEOUS

## 2022-01-05 MED ORDER — MIDAZOLAM-SODIUM CHLORIDE 100-0.9 MG/100ML-% IV SOLN
2.0000 mg/h | INTRAVENOUS | Status: DC
  Administered 2022-01-05: 2 mg/h via INTRAVENOUS
  Filled 2022-01-05: qty 100

## 2022-01-05 MED ORDER — CHLORHEXIDINE GLUCONATE CLOTH 2 % EX PADS
6.0000 | MEDICATED_PAD | Freq: Every day | CUTANEOUS | Status: DC
  Administered 2022-01-06 – 2022-01-16 (×12): 6 via TOPICAL

## 2022-01-05 MED ORDER — LEVETIRACETAM IN NACL 1000 MG/100ML IV SOLN
1000.0000 mg | Freq: Two times a day (BID) | INTRAVENOUS | Status: AC
  Administered 2022-01-06 – 2022-01-09 (×8): 1000 mg via INTRAVENOUS
  Filled 2022-01-05 (×8): qty 100

## 2022-01-05 MED ORDER — CHLORHEXIDINE GLUCONATE 0.12% ORAL RINSE (MEDLINE KIT)
15.0000 mL | Freq: Two times a day (BID) | OROMUCOSAL | Status: DC
  Administered 2022-01-05 – 2022-01-22 (×31): 15 mL via OROMUCOSAL

## 2022-01-05 MED ORDER — THIAMINE HCL 100 MG/ML IJ SOLN
250.0000 mg | Freq: Every day | INTRAVENOUS | Status: DC
  Administered 2022-01-08 – 2022-01-12 (×5): 250 mg via INTRAVENOUS
  Filled 2022-01-05 (×6): qty 2.5

## 2022-01-05 MED ORDER — HEPARIN SODIUM (PORCINE) 5000 UNIT/ML IJ SOLN
5000.0000 [IU] | Freq: Three times a day (TID) | INTRAMUSCULAR | Status: DC
  Administered 2022-01-06 – 2022-01-22 (×51): 5000 [IU] via SUBCUTANEOUS
  Filled 2022-01-05 (×51): qty 1

## 2022-01-05 MED ORDER — ORAL CARE MOUTH RINSE
15.0000 mL | OROMUCOSAL | Status: DC
  Administered 2022-01-05 – 2022-01-13 (×71): 15 mL via OROMUCOSAL

## 2022-01-05 MED ORDER — PROPOFOL 1000 MG/100ML IV EMUL
INTRAVENOUS | Status: AC
  Filled 2022-01-05: qty 100

## 2022-01-05 MED ORDER — PROPOFOL 1000 MG/100ML IV EMUL
5.0000 ug/kg/min | INTRAVENOUS | Status: DC
  Administered 2022-01-05: 80 ug/kg/min via INTRAVENOUS
  Administered 2022-01-05: 10 ug/kg/min via INTRAVENOUS
  Administered 2022-01-05 – 2022-01-06 (×2): 80 ug/kg/min via INTRAVENOUS
  Administered 2022-01-06: 70 ug/kg/min via INTRAVENOUS
  Administered 2022-01-06 (×4): 80 ug/kg/min via INTRAVENOUS
  Administered 2022-01-07 (×2): 20 ug/kg/min via INTRAVENOUS
  Filled 2022-01-05 (×11): qty 100

## 2022-01-05 MED ORDER — LEVETIRACETAM IN NACL 1500 MG/100ML IV SOLN
1500.0000 mg | Freq: Once | INTRAVENOUS | Status: AC
  Administered 2022-01-05: 1500 mg via INTRAVENOUS
  Filled 2022-01-05 (×2): qty 100

## 2022-01-05 MED ORDER — SUCCINYLCHOLINE CHLORIDE 20 MG/ML IJ SOLN
INTRAMUSCULAR | Status: AC | PRN
  Administered 2022-01-05: 100 mg via INTRAVENOUS

## 2022-01-05 MED ORDER — LORAZEPAM 2 MG/ML IJ SOLN
2.0000 mg | Freq: Once | INTRAMUSCULAR | Status: AC
  Administered 2022-01-05: 2 mg via INTRAVENOUS
  Filled 2022-01-05: qty 1

## 2022-01-05 MED ORDER — ETOMIDATE 2 MG/ML IV SOLN
INTRAVENOUS | Status: AC | PRN
  Administered 2022-01-05: 20 mg via INTRAVENOUS

## 2022-01-05 MED ORDER — THIAMINE HCL 100 MG/ML IJ SOLN: 100.0000 mg | Freq: Every day | INTRAMUSCULAR | Status: DC

## 2022-01-05 MED ORDER — THIAMINE HCL 100 MG/ML IJ SOLN
500.0000 mg | Freq: Three times a day (TID) | INTRAVENOUS | Status: AC
  Administered 2022-01-05 – 2022-01-07 (×6): 500 mg via INTRAVENOUS
  Filled 2022-01-05 (×6): qty 5

## 2022-01-05 MED ORDER — DEXTROSE 50 % IV SOLN
50.0000 mL | Freq: Once | INTRAVENOUS | Status: AC
  Administered 2022-01-05: 50 mL via INTRAVENOUS
  Filled 2022-01-05: qty 50

## 2022-01-05 MED ORDER — PANTOPRAZOLE SODIUM 40 MG IV SOLR
40.0000 mg | Freq: Every day | INTRAVENOUS | Status: DC
  Administered 2022-01-05 – 2022-01-06 (×2): 40 mg via INTRAVENOUS
  Filled 2022-01-05 (×2): qty 40

## 2022-01-05 MED ORDER — DEXTROSE 50 % IV SOLN
1.0000 | Freq: Once | INTRAVENOUS | Status: AC
  Administered 2022-01-05: 50 mL via INTRAVENOUS

## 2022-01-05 MED ORDER — THIAMINE HCL 100 MG/ML IJ SOLN
100.0000 mg | Freq: Every day | INTRAMUSCULAR | Status: DC
  Administered 2022-01-14 – 2022-01-19 (×6): 100 mg via INTRAVENOUS
  Filled 2022-01-05 (×8): qty 2

## 2022-01-05 MED ORDER — DEXTROSE 10 % IV SOLN: INTRAVENOUS | Status: DC

## 2022-01-05 MED ORDER — PROPOFOL 1000 MG/100ML IV EMUL
5.0000 ug/kg/min | INTRAVENOUS | Status: DC
  Administered 2022-01-05: 5 ug/kg/min via INTRAVENOUS

## 2022-01-05 NOTE — ED Notes (Signed)
Pt transported to CT ?

## 2022-01-05 NOTE — Consult Note (Addendum)
NEUROLOGY CONSULTATION NOTE   Date of service: January 05, 2022 Patient Name: Frederick [name] MRN:  [id number] DOB:  Dec 02, 1961 Reason for consult: "Status epilepticus" Requesting Provider: Lynnell Catalan, MD _ _ _   _ __   _ __ _ _  __ __   _ __   __ _  History of Present Illness  Frederick [name] is a 61 y.o. male with PMH significant for HTN, TB, prior history of heavy EtOh use but quit in sept 2022, seizures and history of status epilepticus on Keppra 1G BID but takes it once a day instead of twice a day, who was brought in by EMS after prolonged seizure at home.  Neurology was consulted for evaluation and management of potential status epilepticus.  Patient is intubated and unable to provide any meaningful history.  I spoke to patient's wife over the phone who reports that Frederick [name] quit drinking alcohol in September 2022.  Wife reports that today everything seemed fine with him and then she heard him choking and heard something fall.  She went into the room and saw him seizing.  Both of his arms and legs were seizing he had some foaming at his mouth.  This lasted for 5 minutes.  EMS was called he was having persistent jerking he was given Versed 5 mg and he was still seizing in the ED.  He was noted to have left-sided jerking and he was given Ativan 2 mg and intubated with etomidate and succinylcholine.  He was started on propofol and is currently on propofol 80 mcg/kg/min it was also loaded with Keppra 1500 mg IV once.   Work-up here with CT head without contrast with no acute abnormalities but notable for chronic microvascular disease.  Vitals with hypertension and tachycardia.  His COVID-19 PCR came back positive.  On my evaluation, he has intermittent left leg jerking but it appears to be arrhythmic at this time and unclear if this is clinically consistent with seizure.  He is being hooked up to continuous EEG at this time.   ROS   I was unable to obtain due to review of system due to  intubation and sedation.  Past History   Past Medical History:  Diagnosis Date   Arthritis    Asthma    Bilateral lower extremity edema 05/28/2021   propping feet up   Cough 05/28/2021   with clear sputum   Dyspnea    Dyspnea 05/28/2021   with exertion   EtOH dependence (HCC)    drinks 2-3 of 40ounes a day   Fibula fracture    Dr Dion Saucier 02-2011   Finger osteomyelitis, right (HCC) 05/26/2021   right index finger   Hernia    surgery 01-17-13   Hypertension    not taking bp meds since may 2022   Seizures (HCC)    Streptococcal bacteremia 09/09/2021   Tuberculosis    couple of yrs ago took tx at [facility] departmen per pt on 05-28-2021   Past Surgical History:  Procedure Laterality Date   AMPUTATION Right 05/29/2021   Procedure: PARTIAL RIGHT 2ND FINGER AMPUTATION;  Surgeon: Kathryne Hitch, MD;  Location: [facility];  Service: Orthopedics;  Laterality: Right;   ANKLE SURGERY     INGUINAL HERNIA REPAIR Right 01/19/2013   Procedure: HERNIA REPAIR INGUINAL ADULT;  Surgeon: [name], MD,FACS;  Location: [facility] OR;  Service: General;  Laterality: Right;   TEE WITHOUT CARDIOVERSION N/A 08/14/2021   Procedure:  TRANSESOPHAGEAL ECHOCARDIOGRAM (TEE);  Surgeon: Wendall StadeNishan, Peter C, MD;  [facility];  Service: Cardiovascular;  Laterality: N/A;   TIBIA IM NAIL INSERTION Left 01/22/2013   Procedure: INTRAMEDULLARY (IM) NAIL TIBIAL;  Surgeon: Eulas PostJoshua P Landau, MD;  Location: [facility] OR;  Service: Orthopedics;  Laterality: Left;   Family History  Problem Relation Age of Onset   Diabetes Mother    Diabetes Father    Hypertension Father    Social History   Socioeconomic History   Marital status: Widowed    Spouse name: Not on file   Number of children: Not on file   Years of education: Not on file   Highest education level: Not on file  Occupational History   Not on file  Tobacco Use   Smoking status: Every Day    Packs/day: 0.50    Types:  Cigarettes   Smokeless tobacco: Never   Tobacco comments:    1 ppd  Vaping Use   Vaping Use: Never used  Substance and Sexual Activity   Alcohol use: Yes    Comment: 1 beer this am per pt on 05-28-2021   Drug use: No   Sexual activity: Never  Other Topics Concern   Not on file  Social History Narrative   Not on file   Social Determinants of Health   Financial Resource Strain: Not on file  Food Insecurity: Not on file  Transportation Needs: Not on file  Physical Activity: Not on file  Stress: Not on file  Social Connections: Not on file   No Known Allergies  Medications  (Not in a hospital admission)    Vitals   Vitals:   01/05/22 1825 01/05/22 1830 01/05/22 1835 01/05/22 1855  BP: (!) 177/102 (!) 172/106  (!) 168/93  Pulse:      Resp: 16 16 16 16   Temp:      TempSrc:      SpO2:      Weight:      Height:         Body mass index is 22.43 kg/m.  Physical Exam   General: Laying comfortably in bed; in no acute distress.  HENT: Normal oropharynx and mucosa. Normal external appearance of ears and nose.  Neck: Supple, no pain or tenderness  CV: No JVD. No peripheral edema.  Pulmonary: Symmetric Chest rise. Normal respiratory effort.  Abdomen: Soft to touch, non-tender.  Ext: No cyanosis, edema, or deformity  Skin: No rash. Normal palpation of skin.   Musculoskeletal: Normal digits and nails by inspection. No clubbing.   Neurologic Examination  Mental status/Cognition: No response to voice or loud clap.  With tactile stimulation he has symmetric facial grimace.  Speech/language: Intubated with no attempts to communicate.   Cranial nerves:   CN II ROUND, PINPOINT and reactive to light.   CN III,IV,VI EOMI intact to doll's eye.  No gaze preference, no gaze deviation.   CN V Corneals are weakly positive bilaterally.   CN VII Symmetric facial grimace.   CN VIII Does not turn head towards speech.   CN IX & X Cough intact, unable to elicit a gag.   CN XI Unable  to assess   CN XII midline tongue but does not protrude to command.   Motor/sensory:  Muscle bulk: poor, tone flaccid in all extremities. Unable to do detailed strength testing secondary to intubation and sedation and profound encephalopathy. He localizes with right upper extremity.  No movement noted in left upper extremity.  He withdraws bilateral  lower extremity to Babinski's reflex.  Reflexes:  Right Left Comments  Pectoralis      Biceps (C5/6) 2 2   Brachioradialis (C5/6) 2 2    Triceps (C6/7) 2 2    Patellar (L3/4) 2 2    Achilles (S1)      Hoffman      Plantar     Jaw jerk    Coordination/Complex Motor:  Unable to assess due to intubation and sedation. Unable and unsafe to assess gait secondary to intubation and sedation. Labs   CBC:  Recent Labs  Lab 01/05/22 1716 01/05/22 1720 01/05/22 1811  WBC 6.1  --   --   NEUTROABS 3.9  --   --   HGB 13.1 14.6 13.6  HCT 41.5 43.0 40.0  MCV 87.9  --   --   PLT 236  --   --     Basic Metabolic Panel:  Lab Results  Component Value Date   NA 137 01/05/2022   K 3.8 01/05/2022   CO2 28 01/05/2022   GLUCOSE 98 01/05/2022   BUN 7 (L) 01/05/2022   CREATININE 0.80 01/05/2022   CALCIUM 9.1 01/05/2022   GFRNONAA >60 01/05/2022   GFRAA >60 04/27/2020   Lipid Panel:  Lab Results  Component Value Date   LDLCALC 87 06/14/2008   HgbA1c:  Lab Results  Component Value Date   HGBA1C 4.8 08/10/2021   Urine Drug Screen:     Component Value Date/Time   LABOPIA NONE DETECTED 11/26/2021 1115   COCAINSCRNUR NONE DETECTED 11/26/2021 1115   LABBENZ NONE DETECTED 11/26/2021 1115   AMPHETMU NONE DETECTED 11/26/2021 1115   THCU NONE DETECTED 11/26/2021 1115   LABBARB NONE DETECTED 11/26/2021 1115    Alcohol Level     Component Value Date/Time   ETH <10 01/05/2022 1717    CT Head without contrast(Personally reviewed): CTH was negative for a large hypodensity concerning for a large territory infarct or hyperdensity  concerning for an ICH  MRI Brain(Personally reviewed): pending  cEEG:  pending  Impression   KELSEY EDMAN is a 61 y.o. male with PMH significant for HTN, TB, prior history of heavy EtOh use but quit in sept 2022, seizures and history of status epilepticus on Keppra 1G BID but takes it once a day instead of twice a day, who was brought in by EMS after prolonged seizure at home and was still seizing in the ED. He was intubated, given Keppra, ativan and started on propofol and Versed.  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 He would benefit from longterm AEDs.  Recommendations  - I ordered additional Keppra 1500mg  IV once - cEEG ordered and pending. - Continue propofol at current dose of 73mcg/kg/min and Versed at 2mg /hr for now until patient is on cEEG. - Versed 5mg  for any seizure activity lasting more than 3 mins - Seizure precautions - Thiamine replacement, Wernicke's dosing. - Continue Keppra 1000mg  BID. Can switch to Keppra XR at discharge to make compliance easier. - We will continue to follow along. ___________________________________________________________________  This patient is critically ill and at significant risk of neurological worsening, death and care requires constant monitoring of vital signs, hemodynamics,respiratory and cardiac monitoring, neurological assessment, discussion with family, other specialists and medical decision making of high complexity. I spent 50 minutes of neurocritical care time  in the care of  this patient. This was time spent independent of any  time provided by nurse practitioner or PA.  Erick BlinksSalman Marae Cottrell [facility] Pager Number [phone] 01/05/2022  9:11 PM  Thank you for the opportunity to take part in the care of this patient. If you have any further questions, please contact the neurology consultation attending.  Signed,  Erick BlinksSalman  Josalin Carneiro [facility] Pager Number [phone] _ _ _   _ __   _ __ _ _  __ __   _ __   __ _

## 2022-01-05 NOTE — Progress Notes (Signed)
eLink Physician-Brief Progress Note Patient Name: Frederick [name] DOB: 1961-04-02 MRN: [id number]   Date of Service  01/05/2022  HPI/Events of Note  Hypoglycemia - Several episodes. Blood glucose = 63.   eICU Interventions  Plan: D10W IV infusion at 40 mL/hour.     Intervention Category Major Interventions: Other:  Lenell Antu 01/05/2022, 10:28 PM

## 2022-01-05 NOTE — ED Provider Notes (Signed)
[facility] EMERGENCY DEPARTMENT Provider Note   CSN: [id number] Arrival date & time: 01/05/22  1704     History  No chief complaint on file.   Frederick [name] is a 61 y.o. male history of seizure, alcohol abuse, here presenting with seizure. Patient is from home. Per wife, patient has baseline tremors. He is not compliant with his seizure meds. Wife witnessed that he was choking and then saw him fall down on the floor and then had tonic clonic seizure. Patient was given Versed prior to arrival.  Patient still has tonic-clonic jerking movements on arrival.  Patient unable to give history.  Per the wife, patient has not been drinking alcohol recently.  Was intubated in September of last year with similar symptoms and was thought to have seizure secondary to alcohol withdrawal versus primary seizure  The history is provided by the spouse.  Level V caveat- AMS     Home Medications Prior to Admission medications   Medication Sig Start Date End Date Taking? Authorizing Provider  albuterol (VENTOLIN HFA) 108 (90 Base) MCG/ACT inhaler Inhale 2 puffs into the lungs every 4 (four) hours as needed. 12/06/19   [provider]  apixaban (ELIQUIS) 5 MG TABS tablet Take 2 tablets (10 mg total) by mouth 2 (two) times daily for 5 days. 08/28/21 01/16/22  Regalado, Jon Billings A, MD  carvedilol (COREG) 6.25 MG tablet Take 1 tablet (6.25 mg total) by mouth 2 (two) times daily with a meal. 08/28/21   Regalado, Belkys A, MD  diclofenac Sodium (VOLTAREN) 1 % GEL Apply 4 g topically 4 (four) times daily as needed for pain. 10/26/21   [provider]  doxycycline (VIBRA-TABS) 100 MG tablet Take 100 mg by mouth 2 (two) times daily. 10/26/21   [provider]  feeding supplement (ENSURE ENLIVE / ENSURE PLUS) LIQD Take 237 mLs by mouth 3 (three) times daily between meals. Patient not taking: Reported on 09/09/2021 08/28/21   Hartley Barefoot A, MD  folic acid (FOLVITE) 1 MG  tablet Take 1 tablet (1 mg total) by mouth daily. 08/29/21   Regalado, Belkys A, MD  gabapentin (NEURONTIN) 100 MG capsule Take 100 mg by mouth 2 (two) times daily. 10/26/21   [provider]  ipratropium-albuterol (DUONEB) 0.5-2.5 (3) MG/3ML SOLN Take 3 mLs by nebulization every 6 (six) hours as needed. Patient not taking: Reported on 09/09/2021 08/28/21   Regalado, Jon Billings A, MD  levETIRAcetam (KEPPRA) 1000 MG tablet Take 1 tablet (1,000 mg total) by mouth 2 (two) times daily. 08/28/21   Regalado, Belkys A, MD  levETIRAcetam (KEPPRA) 250 MG tablet Take 1 tablet (250 mg total) by mouth 2 (two) times daily. Take the 250 mg tablet with your 1000 mg tablet for total of 1250 mg twice daily. 11/26/21   Arby Barrette, MD  magnesium oxide (MAG-OX) 400 (240 Mg) MG tablet Take 0.5 tablets (200 mg total) by mouth 2 (two) times daily. 08/28/21   Regalado, Belkys A, MD  Multiple Vitamin (MULTIVITAMIN WITH MINERALS) TABS tablet Take 1 tablet by mouth daily. Patient not taking: Reported on 11/26/2021 08/28/21   Regalado, Jon Billings A, MD  nicotine (NICODERM CQ - DOSED IN MG/24 HOURS) 21 mg/24hr patch Place 1 patch (21 mg total) onto the skin daily. Patient not taking: Reported on 09/09/2021 08/29/21   Regalado, Jon Billings A, MD  saccharomyces boulardii (FLORASTOR) 250 MG capsule Take 1 capsule (250 mg total) by mouth 2 (two) times daily. Patient not taking: Reported on 09/09/2021 08/28/21  Regalado, Belkys A, MD  sildenafil (VIAGRA) 100 MG tablet Take 100 mg by mouth daily as needed for erectile dysfunction. 10/31/21   [provider]  thiamine 100 MG tablet Take 1 tablet (100 mg total) by mouth daily. Patient not taking: Reported on 09/09/2021 08/29/21   Hartley Barefoot A, MD  vitamin B-12 100 MCG tablet Take 1 tablet (100 mcg total) by mouth daily. Patient not taking: Reported on 09/09/2021 08/29/21   Hartley Barefoot A, MD  ferrous sulfate 325 (65 FE) MG tablet Take 1 tablet (325 mg total) by mouth 2 (two)  times daily with a meal. Patient not taking: Reported on 02/12/2020 12/03/15 04/17/20  Renae Fickle, MD  zolpidem (AMBIEN) 5 MG tablet Take 1 tablet (5 mg total) by mouth at bedtime as needed for sleep. Patient not taking: Reported on 02/12/2020 12/02/15 04/17/20  Kathlen Mody, MD      Allergies    Patient has no known allergies.    Review of Systems   Review of Systems  Neurological:  Positive for seizures.  All other systems reviewed and are negative.  Physical Exam Updated Vital Signs BP (!) 170/111    Pulse (!) 108    Resp 14    Ht 6\' 1"  (1.854 m)    Wt 77.1 kg    SpO2 100%    BMI 22.43 kg/m  Physical Exam Vitals and nursing note reviewed.  Constitutional:      Comments: Altered and patient has tonic-clonic movement worse on the left side  HENT:     Nose: Nose normal.     Mouth/Throat:     Mouth: Mucous membranes are dry.  Eyes:     Comments: Patient has no obvious eye deviation  Cardiovascular:     Rate and Rhythm: Regular rhythm. Tachycardia present.     Pulses: Normal pulses.     Heart sounds: Normal heart sounds.  Pulmonary:     Effort: Pulmonary effort is normal.     Breath sounds: Normal breath sounds.  Abdominal:     Palpations: Abdomen is soft.  Musculoskeletal:        General: Normal range of motion.     Cervical back: Normal range of motion and neck supple.  Skin:    General: Skin is warm.     Capillary Refill: Capillary refill takes less than 2 seconds.  Neurological:     Comments: Patient has no eye deviation but has tonic-clonic jerking movements worse on the left side.  Psychiatric:     Comments: Unable    ED Results / Procedures / Treatments   Labs (all labs ordered are listed, but only abnormal results are displayed) Labs Reviewed  CBG MONITORING, ED - Abnormal; Notable for the following components:      Result Value   Glucose-Capillary 64 (*)    All other components within normal limits  I-STAT CHEM 8, ED - Abnormal; Notable for the  following components:   BUN 7 (*)    All other components within normal limits  CBG MONITORING, ED - Abnormal; Notable for the following components:   Glucose-Capillary 142 (*)    All other components within normal limits  RESP PANEL BY RT-PCR (FLU A&B, COVID) ARPGX2  CBC WITH DIFFERENTIAL/PLATELET  COMPREHENSIVE METABOLIC PANEL  ETHANOL  BLOOD GAS, ARTERIAL  TRIGLYCERIDES    EKG EKG Interpretation  Date/Time:  Tuesday January 05 2022 17:12:11 EST Ventricular Rate:  119 PR Interval:  149 QRS Duration: 83 QT Interval:  330 QTC  Calculation: 465 R Axis:   69 Text Interpretation: Sinus tachycardia Right atrial enlargement Confirmed by Giannah Zavadil H [phone]([id number]) on 01/05/2022 5:38:17 PM  Radiology No results found.  Procedures Procedures   CRITICAL CARE PerfoRichardean Canalrmed by: Richardean Canalavid H Jisela Merlino   Total critical care time: 40 minutes  Critical care time was exclusive of separately billable procedures and treating other patients.  Critical care was necessary to treat or prevent imminent or life-threatening deterioration.  Critical care was time spent personally by me on the following activities: development of treatment plan with patient and/or surrogate as well as nursing, discussions with consultants, evaluation of patient's response to treatment, examination of patient, obtaining history from patient or surrogate, ordering and performing treatments and interventions, ordering and review of laboratory studies, ordering and review of radiographic studies, pulse oximetry and re-evaluation of patient's condition.  INTUBATION Performed by: Richardean Canalavid H Kamiya Acord  Required items: required blood products, implants, devices, and special equipment available Patient identity confirmed: provided demographic data and hospital-assigned identification number Time out: Immediately prior to procedure a "time out" was called to verify the correct patient, procedure, equipment, support staff and site/side marked as  required.  Indications: lethargy  Intubation method: Glidescope Laryngoscopy   Preoxygenation: BVM  Sedatives: 20 mg Etomidate Paralytic: 100 mg Succinylcholine  Tube Size: 7.5 cuffed  Post-procedure assessment: chest rise and ETCO2 monitor Breath sounds: equal and absent over the epigastrium Tube secured with: ETT holder Chest x-ray interpreted by radiologist and me.  Chest x-ray findings: endotracheal tube in appropriate position  Patient tolerated the procedure well with no immediate complications.      Medications Ordered in ED Medications  levETIRAcetam (KEPPRA) IVPB 1500 mg/ 100 mL premix (has no administration in time range)  propofol (DIPRIVAN) 1000 MG/100ML infusion (10 mcg/kg/min  77.1 kg Intravenous New Bag/Given 01/05/22 1742)  dextrose 50 % solution 50 mL (50 mLs Intravenous Given 01/05/22 1709)  etomidate (AMIDATE) injection (20 mg Intravenous Given 01/05/22 1709)  succinylcholine (ANECTINE) injection (100 mg Intravenous Given 01/05/22 1710)    ED Course/ Medical Decision Making/ A&P                           Medical Decision Making Frederick [name] is a 61 y.o. male here presenting with seizure-like activity.  Patient has history of seizure and is noncompliant with his Keppra.  Patient is altered and seizing on arrival despite Versed.  At this point, patient will need intubation.  We will give Keppra and will also consult neurology    5:51 PM Chemistry unremarkable.  Patient started having tremors again despite being on propofol drip.  We will give Ativan IV.  We will also start Versed drip.  Consulted Dr. Thomasena Edisollins from neurology to see patient and to set up for EEG.  I also consulted ICU to admit patient as well.   Problems Addressed: Status epilepticus [facility](HCC): acute illness or injury  Amount and/or Complexity of Data Reviewed Independent Historian: spouse External Data Reviewed: labs and notes.    Details: admission from September last year Labs:  ordered. Radiology: ordered.  Risk Prescription drug management. Decision regarding hospitalization.   Final Clinical Impression(s) / ED Diagnoses Final diagnoses:  None    Rx / DC Orders ED Discharge Orders     None         Charlynne PanderYao, Early Steel Hsienta, MD 01/05/22 [id number]

## 2022-01-05 NOTE — Progress Notes (Signed)
LTM EEG hooked up and running - no initial skin breakdown - push button tested - neuro notified. [facility] monitoring.  

## 2022-01-05 NOTE — H&P (Signed)
NAME:  Frederick [name], MRN:  [id number], DOB:  [birth year], LOS: 0 ADMISSION DATE:  01/05/2022 CONSULTATION DATE:  01/05/2022 REFERRING MD:  Darl Householder - EDP CHIEF COMPLAINT:  Status epilepticus   History of Present Illness:  61 year old man who presented to [facility] ED 1/31 via EMS for seizures, concern for alcohol withdrawal. PMHx significant for HTN, asthma, tuberculosis (s/p treatment at [facility]), EtOH abuse, R finger osteomyelitis with strep bacteremia.  Of note, patient has seizure history with unclear medication compliance.  Per report, patient experienced several episodes of seizure activity at home.  EMS was called and patient was continuing to seize on arrival.  On arrival to ED, patient had ongoing focal left-sided seizure activity.  He was intubated for airway protection.  Keppra load was administered and EEG was obtained.  Neurology was consulted.  PCCM was consulted for further management and ICU admission.  Pertinent Medical History:   Past Medical History:  Diagnosis Date   Arthritis    Asthma    Bilateral lower extremity edema 05/28/2021   propping feet up   Cough 05/28/2021   with clear sputum   Dyspnea    Dyspnea 05/28/2021   with exertion   EtOH dependence (HCC)    drinks 2-3 of 40ounes a day   Fibula fracture    Dr Mardelle Matte 02-2011   Finger osteomyelitis, right ([place]) 05/26/2021   right index finger   Hernia    surgery 01-17-13   Hypertension    not taking bp meds since may 2022   Streptococcal bacteremia 09/09/2021   Tuberculosis    couple of yrs ago took tx at [place] per pt on 05-28-2021   Significant Hospital Events: Including procedures, antibiotic start and stop dates in addition to other pertinent events   1/31 - BIB EMS after seizure at home, persistently seizing in ED with focal seizure activity of L hand. Difficult to sedate, history of EtOH abuse/withdrawal. Intubated for airway protection. C/f ongoing seizure, Neuro  consult, EEG ordered.  Interim History / Subjective:  PCCM consulted for admission  Objective:  Blood pressure (!) 170/111, pulse (!) 108, resp. rate 14, height 6\' 1"  (1.854 m), weight 77.1 kg, SpO2 100 %.    Vent Mode: PRVC FiO2 (%):  [100 %] 100 % Set Rate:  [16 bmp] 16 bmp Vt Set:  [540 mL-640 mL] 640 mL PEEP:  [5 cmH20] 5 cmH20 Plateau Pressure:  [14 cmH20] 14 cmH20  No intake or output data in the 24 hours ending 01/05/22 1752 Filed Weights   01/05/22 1729  Weight: 77.1 kg   Physical Examination: General: Acutely ill-appearing middle aged man in NAD. Mildly agitated. HEENT: [place]/AT, anicteric sclera, PERRL with upward left gaze deviation, moist mucous membranes. ETT in place. Neuro: Sedated. Withdraws to pain in all 4 extremities. Localizes with R hand to noxious stimuli. Not following commands. Moving RUE and BLE spontaneously. +Cough and +Gag  CV: RRR, no m/g/r. PULM: Breathing even and unlabored on vent (PEEP 5, FiO2 60%). Lung fields coarse in upper fields. GI: Soft, nontender, nondistended. Normoactive bowel sounds. Extremities: No LE edema noted. Skin: Warm/dry, no rashes.  Resolved Hospital Problem List:    Assessment & Plan:  Status epilepticus Acute encephalopathy, likely metabolic Presented to [facility] ED via EMS after seizures at home. Persistent seizure-like activity on admission to ED. - Admit to ICU for close monitoring - Neurology consult, appreciate recommendations - S/p Keppra load - AEDs per Neuro - Continue Propofol/Versed -  STAT EEG, consider LTM - Seizure precautions - Neuroprotective measures: HOB > 30 degrees, normoglycemia, normothermia, electrolytes WNL  History of EtOH abuse C/f EtOH withdrawal History of prior admissions with similar presentation, c/f EtOH withdrawal. - Monitor for signs/symptoms of withdrawal - Consider phenobarbital if ongoing concern - Versed gtt as above - Thiamine - MV when able to tolerate PO  Acute hypoxemic  respiratory failure History of asthma - Continue full vent support (4-8cc/kg IBW) - Wean FiO2 for O2 sat > 90% - Daily WUA/SBT - Bronchodilators PRN - VAP bundle - Pulmonary hygiene - PAD protocol for sedation: Propofol and Versed for goal RASS -1 to -2  GERD - Continue PPI  History of TB S/p [facility] - Monitor CXR  Best Practice: (right click and "Reselect all SmartList Selections" daily)   Diet/type: NPO DVT prophylaxis: SCD GI prophylaxis: PPI Lines: N/A Foley:  N/A Code Status:  full code Last date of multidisciplinary goals of care discussion [Pending]  Labs:  CBC: Recent Labs  Lab 01/05/22 1720  HGB 14.6  HCT 43.0   Basic Metabolic Panel: Recent Labs  Lab 01/05/22 1720  NA 138  K 3.9  CL 98  GLUCOSE 98  BUN 7*  CREATININE 0.80   GFR: Estimated Creatinine Clearance: 105.7 mL/min (by C-G formula based on SCr of 0.8 mg/dL). No results for input(s): PROCALCITON, WBC, LATICACIDVEN in the last 168 hours.  Liver Function Tests: No results for input(s): AST, ALT, ALKPHOS, BILITOT, PROT, ALBUMIN in the last 168 hours. No results for input(s): LIPASE, AMYLASE in the last 168 hours. No results for input(s): AMMONIA in the last 168 hours.  ABG:    Component Value Date/Time   PHART 7.530 (H) 08/11/2021 0508   PCO2ART 32.9 08/11/2021 0508   PO2ART 127 (H) 08/11/2021 0508   HCO3 30.2 (H) 11/26/2021 1111   TCO2 28 01/05/2022 1720   O2SAT 60.0 11/26/2021 1111    Coagulation Profile: No results for input(s): INR, PROTIME in the last 168 hours.  Cardiac Enzymes: No results for input(s): CKTOTAL, CKMB, CKMBINDEX, TROPONINI in the last 168 hours.  HbA1C: Hgb A1c MFr Bld  Date/Time Value Ref Range Status  08/10/2021 03:37 PM 4.8 4.8 - 5.6 % Final    Comment:    (NOTE) Pre diabetes:          5.7%-6.4%  Diabetes:              >6.4%  Glycemic control for   <7.0% adults with diabetes    CBG: Recent Labs  Lab 01/05/22 1708  01/05/22 1722  GLUCAP 64* 142*   Review of Systems:   Patient is encephalopathic and/or intubated. Therefore history has been obtained from chart review.   Past Medical History:  He,  has a past medical history of Arthritis, Asthma, Bilateral lower extremity edema (05/28/2021), Cough (05/28/2021), Dyspnea, Dyspnea (05/28/2021), EtOH dependence (HCC), Fibula fracture, Finger osteomyelitis, right (HCC) (05/26/2021), Hernia, Hypertension, Streptococcal bacteremia (09/09/2021), and Tuberculosis.   Surgical History:   Past Surgical History:  Procedure Laterality Date   AMPUTATION Right 05/29/2021   Procedure: PARTIAL RIGHT 2ND FINGER AMPUTATION;  Surgeon: Kathryne HitchBlackman, Christopher Y, MD;  [facility];  Service: Orthopedics;  Laterality: Right;   ANKLE SURGERY     INGUINAL HERNIA REPAIR Right 01/19/2013   Procedure: HERNIA REPAIR INGUINAL ADULT;  Surgeon: Atilano InaEric M Wilson, MD,FACS;  Location: [facility] OR;  Service: General;  Laterality: Right;   TEE WITHOUT CARDIOVERSION N/A 08/14/2021  Procedure: TRANSESOPHAGEAL ECHOCARDIOGRAM (TEE);  Surgeon: Josue Hector, MD;  Location: [facility] ENDOSCOPY;  Service: Cardiovascular;  Laterality: N/A;   TIBIA IM NAIL INSERTION Left 01/22/2013   Procedure: INTRAMEDULLARY (IM) NAIL TIBIAL;  Surgeon: Johnny Bridge, MD;  Location: [facility];  Service: Orthopedics;  Laterality: Left;   Social History:   reports that he has been smoking cigarettes. He has been smoking an average of .5 packs per day. He has never used smokeless tobacco. He reports current alcohol use. He reports that he does not use drugs.   Family History:  His family history includes Diabetes in his father and mother; Hypertension in his father.   Allergies: No Known Allergies   Home Medications: Prior to Admission medications   Medication Sig Start Date End Date Taking? Authorizing Provider  albuterol (VENTOLIN HFA) 108 (90 Base) MCG/ACT inhaler Inhale 2 puffs into the lungs every 4  (four) hours as needed. 12/06/19   [provider]  apixaban (ELIQUIS) 5 MG TABS tablet Take 2 tablets (10 mg total) by mouth 2 (two) times daily for 5 days. 08/28/21 01/16/22  Regalado, Jerald Kief A, MD  carvedilol (COREG) 6.25 MG tablet Take 1 tablet (6.25 mg total) by mouth 2 (two) times daily with a meal. 08/28/21   Regalado, Belkys A, MD  diclofenac Sodium (VOLTAREN) 1 % GEL Apply 4 g topically 4 (four) times daily as needed for pain. 10/26/21   [provider]  doxycycline (VIBRA-TABS) 100 MG tablet Take 100 mg by mouth 2 (two) times daily. 10/26/21   [provider]  feeding supplement (ENSURE ENLIVE / ENSURE PLUS) LIQD Take 237 mLs by mouth 3 (three) times daily between meals. Patient not taking: Reported on 09/09/2021 08/28/21   Niel Hummer A, MD  folic acid (FOLVITE) 1 MG tablet Take 1 tablet (1 mg total) by mouth daily. 08/29/21   Regalado, Belkys A, MD  gabapentin (NEURONTIN) 100 MG capsule Take 100 mg by mouth 2 (two) times daily. 10/26/21   [provider]  ipratropium-albuterol (DUONEB) 0.5-2.5 (3) MG/3ML SOLN Take 3 mLs by nebulization every 6 (six) hours as needed. Patient not taking: Reported on 09/09/2021 08/28/21   Regalado, Jerald Kief A, MD  levETIRAcetam (KEPPRA) 1000 MG tablet Take 1 tablet (1,000 mg total) by mouth 2 (two) times daily. 08/28/21   Regalado, Belkys A, MD  levETIRAcetam (KEPPRA) 250 MG tablet Take 1 tablet (250 mg total) by mouth 2 (two) times daily. Take the 250 mg tablet with your 1000 mg tablet for total of 1250 mg twice daily. 11/26/21   Charlesetta Shanks, MD  magnesium oxide (MAG-OX) 400 (240 Mg) MG tablet Take 0.5 tablets (200 mg total) by mouth 2 (two) times daily. 08/28/21   Regalado, Belkys A, MD  Multiple Vitamin (MULTIVITAMIN WITH MINERALS) TABS tablet Take 1 tablet by mouth daily. Patient not taking: Reported on 11/26/2021 08/28/21   Regalado, Jerald Kief A, MD  nicotine (NICODERM CQ - DOSED IN MG/24 HOURS) 21 mg/24hr patch Place 1  patch (21 mg total) onto the skin daily. Patient not taking: Reported on 09/09/2021 08/29/21   Regalado, Jerald Kief A, MD  saccharomyces boulardii (FLORASTOR) 250 MG capsule Take 1 capsule (250 mg total) by mouth 2 (two) times daily. Patient not taking: Reported on 09/09/2021 08/28/21   Regalado, Jerald Kief A, MD  sildenafil (VIAGRA) 100 MG tablet Take 100 mg by mouth daily as needed for erectile dysfunction. 10/31/21   [provider]  thiamine 100 MG tablet Take 1 tablet (100 mg total)  by mouth daily. Patient not taking: Reported on 09/09/2021 08/29/21   Niel Hummer A, MD  vitamin B-12 100 MCG tablet Take 1 tablet (100 mcg total) by mouth daily. Patient not taking: Reported on 09/09/2021 08/29/21   Niel Hummer A, MD  ferrous sulfate 325 (65 FE) MG tablet Take 1 tablet (325 mg total) by mouth 2 (two) times daily with a meal. Patient not taking: Reported on 02/12/2020 12/03/15 04/17/20  Janece Canterbury, MD  zolpidem (AMBIEN) 5 MG tablet Take 1 tablet (5 mg total) by mouth at bedtime as needed for sleep. Patient not taking: Reported on 02/12/2020 12/02/15 04/17/20  Hosie Poisson, MD    Critical care time: 45 minutes   Lestine Mount, PA-C [facility] 01/05/22 5:52 PM  Please see [email or web address] for pager details.  From 7A-7P if no response, please call [phone] After hours, please call ELink [phone]

## 2022-01-05 NOTE — Progress Notes (Signed)
EEG complete - results pending 

## 2022-01-05 NOTE — ED Triage Notes (Signed)
Pt arrived to ED via EMS from home w/ c/o seizure. EMS reports upon scene pt was lying in the floor and c/o R leg pain. When asking the pt's wife, she said pt had a seizure and pt said he didn't. EMS reports pt was not postictal. Pt reported he wanted to come to ED for leg pain. EMS reports they were taking him down the stairs w/ the chair lift when pt started having tremors which progressively got worse and progressed to facial tics, then an "posture seizure" and then full body seizure and went fully unresponsive. EMS reports while pt was posturing he would track them with his eyes when sternal rubbed. Pt newly dx w/ seizure per EMS, unknown about medication compliance and wife is poor historian per EMS. EMS gave 5mg  versed IM and then 2.5mg  IV versed once 20g R wrist was placed. Pt hypertensive w/ EMS 190/120, CBG 86, HR 120.   Upon arrival to ED pt was noted to have snoring respirations and EMS was assisting w/ ventilations. Pt was unresponsive.

## 2022-01-05 NOTE — ED Notes (Signed)
CCM providers at bedside 

## 2022-01-05 NOTE — Progress Notes (Signed)
Pt transported to CT on full vent support. No complications noted. °

## 2022-01-05 NOTE — ED Notes (Signed)
Assisted getting the pt placed on the monitor.

## 2022-01-06 ENCOUNTER — Inpatient Hospital Stay (HOSPITAL_COMMUNITY): Payer: Medicare Other

## 2022-01-06 DIAGNOSIS — G40901 Epilepsy, unspecified, not intractable, with status epilepticus: Secondary | ICD-10-CM | POA: Diagnosis not present

## 2022-01-06 LAB — CBC
HCT: 38.7 % — ABNORMAL LOW (ref 39.0–52.0)
Hemoglobin: 13 g/dL (ref 13.0–17.0)
MCH: 28 pg (ref 26.0–34.0)
MCHC: 33.6 g/dL (ref 30.0–36.0)
MCV: 83.2 fL (ref 80.0–100.0)
Platelets: 193 10*3/uL (ref 150–400)
RBC: 4.65 MIL/uL (ref 4.22–5.81)
RDW: 15.5 % (ref 11.5–15.5)
WBC: 6.2 10*3/uL (ref 4.0–10.5)
nRBC: 0 % (ref 0.0–0.2)

## 2022-01-06 LAB — GLUCOSE, CAPILLARY
Glucose-Capillary: 123 mg/dL — ABNORMAL HIGH (ref 70–99)
Glucose-Capillary: 98 mg/dL (ref 70–99)
Glucose-Capillary: 99 mg/dL (ref 70–99)
Glucose-Capillary: 50 mg/dL — ABNORMAL LOW (ref 70–99)
Glucose-Capillary: 21 mg/dL — CL (ref 70–99)
Glucose-Capillary: 59 mg/dL — ABNORMAL LOW (ref 70–99)
Glucose-Capillary: 47 mg/dL — ABNORMAL LOW (ref 70–99)

## 2022-01-06 LAB — BASIC METABOLIC PANEL
Anion gap: 8 (ref 5–15)
BUN: 7 mg/dL — ABNORMAL LOW (ref 8–23)
CO2: 23 mmol/L (ref 22–32)
Calcium: 9.2 mg/dL (ref 8.9–10.3)
Chloride: 102 mmol/L (ref 98–111)
Creatinine, Ser: 1.04 mg/dL (ref 0.61–1.24)
GFR, Estimated: >60 mL/min (ref >60)
Glucose, Bld: 119 mg/dL — ABNORMAL HIGH (ref 70–99)
Potassium: 3 mmol/L — ABNORMAL LOW (ref 3.5–5.1)
Sodium: 133 mmol/L — ABNORMAL LOW (ref 135–145)

## 2022-01-06 LAB — MAGNESIUM
Magnesium: 1.3 mg/dL — ABNORMAL LOW (ref 1.7–2.4)
Magnesium: 1.4 mg/dL — ABNORMAL LOW (ref 1.7–2.4)
Magnesium: 2 mg/dL (ref 1.7–2.4)

## 2022-01-06 LAB — PHOSPHORUS
Phosphorus: 2.7 mg/dL (ref 2.5–4.6)
Phosphorus: 3.2 mg/dL (ref 2.5–4.6)
Phosphorus: 3.6 mg/dL (ref 2.5–4.6)

## 2022-01-06 LAB — TRIGLYCERIDES: Triglycerides: 92 mg/dL (ref <150)

## 2022-01-06 LAB — GLUCOSE, RANDOM: Glucose, Bld: 79 mg/dL (ref 70–99)

## 2022-01-06 MED ORDER — MAGNESIUM SULFATE 2 GM/50ML IV SOLN
2.0000 g | Freq: Once | INTRAVENOUS | Status: AC
  Administered 2022-01-06: 2 g via INTRAVENOUS
  Filled 2022-01-06: qty 50

## 2022-01-06 MED ORDER — DEXTROSE 50 % IV SOLN
INTRAVENOUS | Status: AC
  Administered 2022-01-06: 12.5 g via INTRAVENOUS
  Filled 2022-01-06: qty 50

## 2022-01-06 MED ORDER — ACETAMINOPHEN 160 MG/5ML PO SOLN
650.0000 mg | Freq: Four times a day (QID) | ORAL | Status: DC | PRN
  Administered 2022-01-06 – 2022-01-22 (×9): 650 mg
  Filled 2022-01-06 (×9): qty 20.3

## 2022-01-06 MED ORDER — DEXTROSE 50 % IV SOLN: 12.5000 g | INTRAVENOUS | Status: AC

## 2022-01-06 MED ORDER — MAGNESIUM OXIDE -MG SUPPLEMENT 400 (240 MG) MG PO TABS
400.0000 mg | ORAL_TABLET | Freq: Every day | ORAL | Status: AC
  Administered 2022-01-06 – 2022-01-08 (×3): 400 mg
  Filled 2022-01-06 (×3): qty 1

## 2022-01-06 MED ORDER — PROSOURCE TF PO LIQD
45.0000 mL | Freq: Three times a day (TID) | ORAL | Status: DC
  Administered 2022-01-06 – 2022-01-07 (×4): 45 mL
  Filled 2022-01-06 (×3): qty 45

## 2022-01-06 MED ORDER — FENTANYL CITRATE PF 50 MCG/ML IJ SOSY
50.0000 ug | PREFILLED_SYRINGE | INTRAMUSCULAR | Status: DC | PRN
  Administered 2022-01-06 (×2): 50 ug via INTRAVENOUS
  Filled 2022-01-06 (×2): qty 1

## 2022-01-06 MED ORDER — VITAL HIGH PROTEIN PO LIQD: 1000.0000 mL | ORAL | Status: DC

## 2022-01-06 MED ORDER — FENTANYL 2500MCG IN NS 250ML (10MCG/ML) PREMIX INFUSION
0.0000 ug/h | INTRAVENOUS | Status: DC
  Administered 2022-01-06: 25 ug/h via INTRAVENOUS
  Administered 2022-01-07: 75 ug/h via INTRAVENOUS
  Filled 2022-01-06 (×2): qty 250

## 2022-01-06 MED ORDER — VITAL 1.5 CAL PO LIQD
1000.0000 mL | ORAL | Status: DC
  Administered 2022-01-06: 1000 mL

## 2022-01-06 MED ORDER — POTASSIUM CHLORIDE 20 MEQ PO PACK
40.0000 meq | PACK | Freq: Two times a day (BID) | ORAL | Status: DC
  Administered 2022-01-06 – 2022-01-07 (×4): 40 meq
  Filled 2022-01-06 (×4): qty 2

## 2022-01-06 MED ORDER — PROSOURCE TF PO LIQD
45.0000 mL | Freq: Two times a day (BID) | ORAL | Status: DC
  Filled 2022-01-06: qty 45

## 2022-01-06 NOTE — Progress Notes (Signed)
LTM maint complete - no skin breakdown under:  Fp1 Fp2 F7 [facility] monitored, Event button test confirmed by [facility].  

## 2022-01-06 NOTE — Progress Notes (Addendum)
eLink Physician-Brief Progress Note Patient Name: Frederick [name] DOB: [birth year] MRN: [id number]   Date of Service  01/06/2022  HPI/Events of Note  Multiple issues: 1. Hypoglycemia - Blood glucose =  79 from lab. POC CBG's have been erratic.2. Fever to 102 F - WBC = 6.2 this AM. Patient with history of COVID. Nursing states sputum and urine not remarkable except urine green from high dose Propofol IV infusion. 3. Agiation - Nursing reports ventilator asynchrony when weaning Propofol.  eICU Interventions  Plan: Increase D10W to 60 mL/hour. Send blood glucose specimens to lab.  Tylenol liquid 650 mg per tube Q 6 hours PRN Temp > 101.0 F. Defer further infectious w/u with cultures to PCCM rounding team.  Fentanyl IV infusion. Titrate to RASS = 0 to -1.     Intervention Category Major Interventions: Hyperglycemia - active titration of insulin therapy;Other:  Lenell Antu 01/06/2022, 10:24 PM

## 2022-01-06 NOTE — Procedures (Addendum)
Patient Name: Frederick [name]  MRN: [id number]  Epilepsy Attending: Lora Havens  Referring Physician/Provider: Donnetta Simpers, MD Duration: 01/05/2022 2224 to 01/06/2022 2224   Patient history: 61 year old male brought in with convulsive status epilepticus.  EEG to evaluate for seizure.   Level of alertness: comatose   AEDs during EEG study: LEV, propofol, Versed   Technical aspects: This EEG study was done with scalp electrodes positioned according to the 10-20 International system of electrode placement. Electrical activity was acquired at a sampling rate of 500Hz  and reviewed with a high frequency filter of 70Hz  and a low frequency filter of 1Hz . EEG data were recorded continuously and digitally stored.    Description:  EEG showed burst suppression with bursts of 5 to 6 Hz theta slowing admixed with 15 to 18 Hz generalized beta activity lasting about 3 to 4 seconds alternating with generalized EEG suppression lasting 4 to 6 seconds.  Gradually as sedation was weaned, periods of suppression became shorter and EEG evolved into continuous generalized low amplitude mixed frequencies with predominantly 3 to 6 Hz theta-delta slowing.  Hyperventilation and photic stimulation were not performed.      ABNORMALITY -Burst suppression, generalized -Continuous slow, generalized  IMPRESSION: This study was initially suggestive of profound diffuse encephalopathy, nonspecific etiology but likely related to sedation.  Gradually, as sedation was weaned EEG was suggestive severe diffuse encephalopathy.  No seizures or epileptiform discharges were seen throughout the recording.   Fanchon Papania Barbra Sarks

## 2022-01-06 NOTE — Progress Notes (Signed)
NAME:  Frederick [name], MRN:  [id number], DOB:  08-23-1961, LOS: 1 ADMISSION DATE:  01/05/2022 CONSULTATION DATE:  01/05/2022 REFERRING MD:  Silverio Lay - EDP CHIEF COMPLAINT:  Status epilepticus   History of Present Illness:  61 year old man who presented to [facility] ED 1/31 via EMS for seizures, concern for alcohol withdrawal. PMHx significant for HTN, asthma, tuberculosis (s/p treatment at [facility]), EtOH abuse, R finger osteomyelitis with strep bacteremia.  Of note, patient has seizure history with unclear medication compliance.  Per report, patient experienced several episodes of seizure activity at home.  EMS was called and patient was continuing to seize on arrival.  On arrival to ED, patient had ongoing focal left-sided seizure activity.  He was intubated for airway protection.  Keppra load was administered and EEG was obtained.  Neurology was consulted.  PCCM was consulted for further management and ICU admission.  Pertinent Medical History:   Past Medical History:  Diagnosis Date   Arthritis    Asthma    Bilateral lower extremity edema 05/28/2021   propping feet up   Cough 05/28/2021   with clear sputum   Dyspnea    Dyspnea 05/28/2021   with exertion   EtOH dependence (HCC)    drinks 2-3 of 40ounes a day   Fibula fracture    Dr Dion Saucier 02-2011   Finger osteomyelitis, right (HCC) 05/26/2021   right index finger   Hernia    surgery 01-17-13   Hypertension    not taking bp meds since may 2022   Seizures (HCC)    Streptococcal bacteremia 09/09/2021   Tuberculosis    couple of yrs ago took tx at [facility] per pt on 05-28-2021   Significant Hospital Events: Including procedures, antibiotic start and stop dates in addition to other pertinent events   1/31 - BIB EMS after seizure at home, persistently seizing in ED with focal seizure activity of L hand. Difficult to sedate, history of EtOH abuse/withdrawal. Intubated for airway protection. C/f  ongoing seizure, Neuro consult, EEG ordered. 2/1 - burst suppression pattern on EEG  Interim History / Subjective:  On continuous EEG and sedation with no further seizures. Started on D10W for hypoglycemia  Objective:  Blood pressure (!) 115/92, pulse 87, temperature (!) 96.9 F (36.1 C), temperature source Axillary, resp. rate 16, height 6\' 1"  (1.854 m), weight 77 kg, SpO2 100 %.    Vent Mode: PRVC FiO2 (%):  [40 %-100 %] 40 % Set Rate:  [16 bmp] 16 bmp Vt Set:  [540 mL-640 mL] 640 mL PEEP:  [5 cmH20] 5 cmH20 Plateau Pressure:  [14 cmH20-17 cmH20] 16 cmH20   Intake/Output Summary (Last 24 hours) at 01/06/2022 0853 Last data filed at 01/06/2022 0600 Gross per 24 hour  Intake 976.57 ml  Output 300 ml  Net 676.57 ml   Filed Weights   01/05/22 1729 01/06/22 0500  Weight: 77.1 kg 77 kg   Physical Examination: General: Acutely ill-appearing middle aged man in NAD. On propofol and versed infusions.  HEENT: EEG leads in place OGT/ETT in place. Neuro: No response to painful stimulation on sedation. LTM shows profound burst suppression, almost isoelectric EEG.  CV: RRR, no m/g/r. PULM: Chest clear bilaterally.   GI: Soft, nontender, nondistended. Normoactive bowel sounds. Minimal clear OGT drainage  Extremities: No LE edema noted. Skin: Warm/dry, no rashes.  Ancillary tests personally reviewed:   Hypokalemia 3.0 Hypomagnesemia 1.3  Assessment & Plan:  Status epilepticus Acute encephalopathy, likely metabolic History  of EtOH abuse C/f EtOH withdrawal Acute hypoxemic respiratory failure History of asthma GERD History of TB Hypoglycemia  Plan:   - Continue full ventilatory support. - Stop versed and start to wean propofol, follow LTM for seizure recurrence.  - Continue current AED. Add further agents if seizure recur.  - Start feeds and wean off D10W.  Best Practice: (right click and "Reselect all SmartList Selections" daily)   Diet/type: NPO - start tube feeds.  DVT  prophylaxis: SCD GI prophylaxis: PPI Lines: N/A Foley:  N/A Code Status:  full code Last date of multidisciplinary goals of care discussion [Pending]    Critical care time: 40 minutes   Lynnell Catalan, MD [place] [facility] 01/06/22 8:53 AM  Please see [email or web address] for pager details.  From 7A-7P if no response, please call [phone] After hours, please call ELink [phone]

## 2022-01-06 NOTE — Procedures (Signed)
Patient Name: Frederick [name]  MRN: [id number]  Epilepsy Attending: Lora Havens  Referring Physician/Provider: Lestine Mount, PA-C Date: 01/05/2022 Duration: 21.32 mins  Patient history: 62 year old male brought in with convulsive status epilepticus.  EEG to evaluate for seizure.  Level of alertness: comatose  AEDs during EEG study: LEV, propofol, Versed  Technical aspects: This EEG study was done with scalp electrodes positioned according to the 10-20 International system of electrode placement. Electrical activity was acquired at a sampling rate of 500Hz  and reviewed with a high frequency filter of 70Hz  and a low frequency filter of 1Hz . EEG data were recorded continuously and digitally stored.   Description:  EEG showed continuous generalized 2 to 3 Hz delta slowing with overriding 15 to 18 Hz beta activity. Hyperventilation and photic stimulation were not performed.      ABNORMALITY - Continuous slow, generalized   IMPRESSION: This study is suggestive of severe diffuse encephalopathy, nonspecific etiology but likely related to sedation. No seizures or epileptiform discharges were seen throughout the recording.  Haylynn Pha Barbra Sarks

## 2022-01-06 NOTE — Progress Notes (Signed)
°  Transition of Care [facility]) Screening Note   Patient Details  Name: TIAM NELLER Date of Birth: Sep 19, 1961   Transition of Care [facility]) CM/SW Contact:    Benard Halsted, LCSW Phone Number: 01/06/2022, 12:18 PM    Transition of Care Department [facility]) has reviewed patient and no TOC needs have been identified at this time. We will continue to monitor patient advancement through interdisciplinary progression rounds. If new patient transition needs arise, please place a TOC consult.

## 2022-01-06 NOTE — Progress Notes (Signed)
Ltm maint

## 2022-01-06 NOTE — Progress Notes (Signed)
[phone] paged Dr. Denese Killings regarding activity on EEG since sedation decreased. Verbal order to give 4 mg of versed and resume versed at 4 and He will come to bedside.

## 2022-01-07 ENCOUNTER — Other Ambulatory Visit (HOSPITAL_COMMUNITY): Payer: Self-pay

## 2022-01-07 DIAGNOSIS — E43 Unspecified severe protein-calorie malnutrition: Secondary | ICD-10-CM | POA: Insufficient documentation

## 2022-01-07 DIAGNOSIS — G40901 Epilepsy, unspecified, not intractable, with status epilepticus: Secondary | ICD-10-CM | POA: Diagnosis not present

## 2022-01-07 LAB — CBC WITH DIFFERENTIAL/PLATELET
Abs Immature Granulocytes: 0.03 10*3/uL (ref 0.00–0.07)
Basophils Absolute: 0 10*3/uL (ref 0.0–0.1)
Basophils Relative: 0 %
Eosinophils Absolute: 0 10*3/uL (ref 0.0–0.5)
Eosinophils Relative: 0 %
HCT: 39.2 % (ref 39.0–52.0)
Hemoglobin: 12.5 g/dL — ABNORMAL LOW (ref 13.0–17.0)
Immature Granulocytes: 0 %
Lymphocytes Relative: 15 %
Lymphs Abs: 1 10*3/uL (ref 0.7–4.0)
MCH: 27.4 pg (ref 26.0–34.0)
MCHC: 31.9 g/dL (ref 30.0–36.0)
MCV: 86 fL (ref 80.0–100.0)
Monocytes Absolute: 1.4 10*3/uL — ABNORMAL HIGH (ref 0.1–1.0)
Monocytes Relative: 20 %
Neutro Abs: 4.4 10*3/uL (ref 1.7–7.7)
Neutrophils Relative %: 65 %
Platelets: 168 10*3/uL (ref 150–400)
RBC: 4.56 MIL/uL (ref 4.22–5.81)
RDW: 15.9 % — ABNORMAL HIGH (ref 11.5–15.5)
WBC: 6.8 10*3/uL (ref 4.0–10.5)
nRBC: 0 % (ref 0.0–0.2)

## 2022-01-07 LAB — GLUCOSE, RANDOM
Glucose, Bld: 99 mg/dL (ref 70–99)
Glucose, Bld: 95 mg/dL (ref 70–99)
Glucose, Bld: 105 mg/dL — ABNORMAL HIGH (ref 70–99)
Glucose, Bld: 134 mg/dL — ABNORMAL HIGH (ref 70–99)
Glucose, Bld: 126 mg/dL — ABNORMAL HIGH (ref 70–99)

## 2022-01-07 LAB — COMPREHENSIVE METABOLIC PANEL
ALT: 8 U/L (ref 0–44)
AST: 17 U/L (ref 15–41)
Albumin: 2.5 g/dL — ABNORMAL LOW (ref 3.5–5.0)
Alkaline Phosphatase: 88 U/L (ref 38–126)
Anion gap: 8 (ref 5–15)
BUN: 11 mg/dL (ref 8–23)
CO2: 21 mmol/L — ABNORMAL LOW (ref 22–32)
Calcium: 8 mg/dL — ABNORMAL LOW (ref 8.9–10.3)
Chloride: 101 mmol/L (ref 98–111)
Creatinine, Ser: 0.94 mg/dL (ref 0.61–1.24)
GFR, Estimated: >60 mL/min (ref >60)
Glucose, Bld: 98 mg/dL (ref 70–99)
Potassium: 3.8 mmol/L (ref 3.5–5.1)
Sodium: 130 mmol/L — ABNORMAL LOW (ref 135–145)
Total Bilirubin: 0.5 mg/dL (ref 0.3–1.2)
Total Protein: 6.6 g/dL (ref 6.5–8.1)

## 2022-01-07 LAB — PHOSPHORUS
Phosphorus: 4.4 mg/dL (ref 2.5–4.6)
Phosphorus: 4.2 mg/dL (ref 2.5–4.6)

## 2022-01-07 LAB — MAGNESIUM
Magnesium: 1.6 mg/dL — ABNORMAL LOW (ref 1.7–2.4)
Magnesium: 2.2 mg/dL (ref 1.7–2.4)

## 2022-01-07 MED ORDER — PANTOPRAZOLE 2 MG/ML SUSPENSION
40.0000 mg | Freq: Every day | ORAL | Status: DC
  Administered 2022-01-07 – 2022-01-10 (×4): 40 mg
  Filled 2022-01-07 (×4): qty 20

## 2022-01-07 MED ORDER — PROSOURCE TF PO LIQD
45.0000 mL | Freq: Two times a day (BID) | ORAL | Status: DC
  Administered 2022-01-07 – 2022-01-22 (×31): 45 mL
  Filled 2022-01-07 (×31): qty 45

## 2022-01-07 MED ORDER — MAGNESIUM SULFATE 4 GM/100ML IV SOLN
4.0000 g | Freq: Once | INTRAVENOUS | Status: AC
  Administered 2022-01-07: 4 g via INTRAVENOUS
  Filled 2022-01-07: qty 100

## 2022-01-07 MED ORDER — VITAL 1.5 CAL PO LIQD
1000.0000 mL | ORAL | Status: DC
  Administered 2022-01-07 – 2022-01-20 (×13): 1000 mL
  Filled 2022-01-07 (×17): qty 1000

## 2022-01-07 MED ORDER — DEXMEDETOMIDINE HCL IN NACL 400 MCG/100ML IV SOLN
0.4000 ug/kg/h | INTRAVENOUS | Status: DC
  Administered 2022-01-07: 0.6 ug/kg/h via INTRAVENOUS
  Administered 2022-01-07: 1.2 ug/kg/h via INTRAVENOUS
  Administered 2022-01-07: 0.6 ug/kg/h via INTRAVENOUS
  Administered 2022-01-08: 0.8 ug/kg/h via INTRAVENOUS
  Administered 2022-01-08: 0.7 ug/kg/h via INTRAVENOUS
  Administered 2022-01-08: 0.6 ug/kg/h via INTRAVENOUS
  Administered 2022-01-09: 0.8 ug/kg/h via INTRAVENOUS
  Administered 2022-01-09: 1.2 ug/kg/h via INTRAVENOUS
  Administered 2022-01-09: 0.9 ug/kg/h via INTRAVENOUS
  Administered 2022-01-10 (×2): 1.2 ug/kg/h via INTRAVENOUS
  Administered 2022-01-10: 0.5 ug/kg/h via INTRAVENOUS
  Administered 2022-01-10: 1 ug/kg/h via INTRAVENOUS
  Administered 2022-01-11: 0.6 ug/kg/h via INTRAVENOUS
  Filled 2022-01-07 (×15): qty 100

## 2022-01-07 NOTE — TOC Benefit Eligibility Note (Signed)
Patient Product/process development scientist completed.    The patient is currently admitted and upon discharge could be taking levetiracetam (Keppra XR) 2000mg  24 hr tablet using levetiracetam (Keppra XR) 500mg  24 hr tablet  The current 30 day co-pay is, $0.00.   The patient is insured through Part D    , CPhT Pharmacy Patient Advocate Specialist [facility] Patient Advocate Team Direct Number: [phone]  Fax: [phone]

## 2022-01-07 NOTE — Progress Notes (Signed)
Initial Nutrition Assessment  DOCUMENTATION CODES:   Severe malnutrition in context of chronic illness  INTERVENTION:   Tube feeding via OG tube: Increase Vital 1.5 to 60 ml/h (1440 ml per day) Prosource TF 45 ml BID  Provides 2240 kcal, 119 gm protein, 1094 ml free water daily Total CHO/24 hrs 269 grams   Communicated with provider  NUTRITION DIAGNOSIS:   Severe Malnutrition related to chronic illness (TB, ETOH abuse) as evidenced by severe muscle depletion, severe fat depletion.  GOAL:   Patient will meet greater than or equal to 90% of their needs  MONITOR:   TF tolerance  REASON FOR ASSESSMENT:   Consult Enteral/tube feeding initiation and management  ASSESSMENT:   Pt with PMH of HTN, asthma, TB, ETOH abuse, R finger osteomyelitis with strep bacteremia admitted with status epilepticus and acute encephalopathy.   Pt discussed during ICU rounds and with RN.  Propofol off, not following commands per RN. Reported no family does have a girlfriend.  Issues with hypoglycemia, remains on D10  2/1 TF started - Vital 1.5 @ 25 ml/hr, increase by 10 ml every 8 hours to goal rate of 55 ml/hr - 2100 kcal, 122 grams protein, 1003 ml free water, 246 grams CHO 2/2 hypoglycemia continues   Patient is currently intubated on ventilator support MV: 10.8 L/min Temp (24hrs), Avg:99.7 F (37.6 C), Min:98.5 F (36.9 C), Max:102.1 F (38.9 C)   Medications reviewed and include: SSI, mag ox, protonix, KCl, thiamine Precedex D10 @ 60 ml/hr  Labs reviewed: Na 130 A1C: 5.5 CBG's: 21-59   OG tube with tip in the body of the stomach   NUTRITION - FOCUSED PHYSICAL EXAM:  Flowsheet Row Most Recent Value  Orbital Region Severe depletion  Upper Arm Region Severe depletion  Thoracic and Lumbar Region Severe depletion  Buccal Region Unable to assess  Temple Region Severe depletion  Clavicle Bone Region Severe depletion  Clavicle and Acromion Bone Region Severe depletion   Scapular Bone Region Severe depletion  Dorsal Hand Severe depletion  Patellar Region Severe depletion  Anterior Thigh Region Severe depletion  Posterior Calf Region Severe depletion  Edema (RD Assessment) None  Hair Reviewed  Eyes Unable to assess  Mouth Unable to assess  Skin Reviewed  Nails Reviewed       Diet Order:   Diet Order             Diet NPO time specified  Diet effective now                   EDUCATION NEEDS:   Not appropriate for education at this time  Skin:  Skin Assessment: Reviewed RN Assessment  Last BM:  unknown  Height:   Ht Readings from Last 1 Encounters:  01/05/22 6\' 1"  (1.854 m)    Weight:   Wt Readings from Last 1 Encounters:  01/06/22 77 kg    BMI:  Body mass index is 22.4 kg/m.  Estimated Nutritional Needs:   Kcal:  2100-2300  Protein:  115-130 grams  Fluid:  > 2 L/day  03/06/22., RD, LDN, CNSC See AMiON for contact information

## 2022-01-07 NOTE — Progress Notes (Signed)
[facility] ADULT ICU REPLACEMENT PROTOCOL   The patient does apply for the [facility] Adult ICU Electrolyte Replacment Protocol based on the criteria listed below:   1.Exclusion criteria: TCTS patients, ECMO patients, and Dialysis patients 2. Is GFR >/= 30 ml/min? Yes.    Patient's GFR today is >60 3. Is SCr </= 2? Yes.   Patient's SCr is 0.94 mg/dL 4. Did SCr increase >/= 0.5 in 24 hours? No. 5.Pt's weight >40kg  Yes.   6. Abnormal electrolyte(s): mag 1.6  7. Electrolytes replaced per protocol 8.  Call MD STAT for K+ </= 2.5, Phos </= 1, or Mag </= 1 Physician:  Deedra Ehrich 01/07/2022 6:34 AM

## 2022-01-07 NOTE — Procedures (Addendum)
Patient Name: EPHREM KNISELEY  MRN: [id number]  Epilepsy Attending: Lora Havens  Referring Physician/Provider: Donnetta Simpers, MD Duration: 01/06/2022 2224 to 01/07/2022 1703   Patient history: 61 year old male brought in with convulsive status epilepticus.  EEG to evaluate for seizure.   Level of alertness: comatose   AEDs during EEG study: LEV, propofol   Technical aspects: This EEG study was done with scalp electrodes positioned according to the 10-20 International system of electrode placement. Electrical activity was acquired at a sampling rate of 500Hz  and reviewed with a high frequency filter of 70Hz  and a low frequency filter of 1Hz . EEG data were recorded continuously and digitally stored.    Description:  EEG showed continuous generalized amplitude 3 to 5 Hz theta and delta slowing. Hyperventilation and photic stimulation were not performed.      ABNORMALITY -Continuous slow, generalized   IMPRESSION: This study is suggestive of severe diffuse encephalopathy, nonspecific etiology. No seizures or epileptiform discharges were seen throughout the recording.   Cagney Steenson Barbra Sarks

## 2022-01-07 NOTE — Progress Notes (Signed)
VLTM discontinue  [facility] notified no skin breakdown noted at all skin sites

## 2022-01-07 NOTE — Progress Notes (Signed)
Subjective: No seizures overnight.    ROS: Unable to obtain due to poor mental status  Examination  Vital signs in last 24 hours: Temp:  [98 F (36.7 C)-102.1 F (38.9 C)] 98.5 F (36.9 C) (02/02 0400) Pulse Rate:  [84-124] 90 (02/02 0745) Resp:  [15-19] 16 (02/02 0745) BP: (116-170)/(78-113) 146/90 (02/02 0700) SpO2:  [99 %-100 %] 100 % (02/02 0745) FiO2 (%):  [40 %] 40 % (02/02 0745)  General: lying in bed, NAD CVS: pulse-normal rate and rhythm RS: Intubated, CTAB Extremities: warm, no edema Neuro: Does not open eyes to noxious stimuli, does not follow commands, pupils small and difficult appreciate any reactivity, corneal reflex intact, does not withdraw to noxious stimuli in all extremities but does wince   Basic Metabolic Panel: Recent Labs  Lab 01/05/22 1716 01/05/22 1720 01/05/22 1811 01/06/22 0718 01/06/22 0950 01/06/22 1640 01/06/22 1955 01/07/22 0047 01/07/22 0512 01/07/22 0808  NA 135 138 137 133*  --   --   --   --  130*  --   K 3.8 3.9 3.8 3.0*  --   --   --   --  3.8  --   CL 100 98  --  102  --   --   --   --  101  --   CO2 28  --   --  23  --   --   --   --  21*  --   GLUCOSE 100* 98  --  119*  --   --  79 99 98   95 105*  BUN 7* 7*  --  7*  --   --   --   --  11  --   CREATININE 0.95 0.80  --  1.04  --   --   --   --  0.94  --   CALCIUM 9.1  --   --  9.2  --   --   --   --  8.0*  --   MG  --   --   --  1.3* 1.4* 2.0  --   --  1.6*  --   PHOS  --   --   --  2.7 3.2 3.6  --   --  4.4  --     CBC: Recent Labs  Lab 01/05/22 1716 01/05/22 1720 01/05/22 1811 01/06/22 0718 01/07/22 0512  WBC 6.1  --   --  6.2 6.8  NEUTROABS 3.9  --   --   --  4.4  HGB 13.1 14.6 13.6 13.0 12.5*  HCT 41.5 43.0 40.0 38.7* 39.2  MCV 87.9  --   --  83.2 86.0  PLT 236  --   --  193 168     Coagulation Studies: No results for input(s): LABPROT, INR in the last 72 hours.  Imaging  CT head without contrast 01/05/2022:No acute intracranial abnormality  ASSESSMENT  AND PLAN: 61 year old male with a past medical history of heavy alcohol use, seizures and history of status epilepticus who presented with convulsive status epilepticus.  Convulsive status epilepticus (resolved) Epilepsy Acute encephalopathy, ictal Hyponatremia Hypoalbuminemia -No seizures overnight on EEG -Status epilepticus likely due to medication noncompliance.  Per history patient was taking Keppra 1 g once daily instead of prescribed twice daily  Recommendations -Reduce propofol to 10 MCG per hour and then stop if no seizures after 1 hour -Okay to use Precedex and fentanyl for agitation/sedation -Continue Keppra 1000 mg twice daily -Once  patient is awake, can consider switching to Keppra 2000 mg extended release once daily -However, will need to discuss with case manager and see if patient's insurance will approve extended release Keppra so that patient can take it once daily -Continue seizure precautions -As needed IV Ativan 2 mg for clinical seizure-like activity -Management of rest of comorbidities per primary team -Plan was discussed with critical care team  CRITICAL CARE Performed by: Lora Havens   Total critical care time: 36 minutes  Critical care time was exclusive of separately billable procedures and treating other patients.  Critical care was necessary to treat or prevent imminent or life-threatening deterioration.  Critical care was time spent personally by me on the following activities: development of treatment plan with patient and/or surrogate as well as nursing, discussions with consultants, evaluation of patient's response to treatment, examination of patient, obtaining history from patient or surrogate, ordering and performing treatments and interventions, ordering and review of laboratory studies, ordering and review of radiographic studies, pulse oximetry and re-evaluation of patient's condition.   Zeb Comfort [facility] For questions after 5pm please refer to AMION to reach the Neurologist on call

## 2022-01-07 NOTE — Progress Notes (Signed)
NAME:  Frederick [name], MRN:  [id number], DOB:  1961/05/26, LOS: 2 ADMISSION DATE:  01/05/2022 CONSULTATION DATE:  01/05/2022 REFERRING MD:  Silverio Lay - EDP CHIEF COMPLAINT:  Status epilepticus   History of Present Illness:  61 year old man who presented to [facility] ED 1/31 via EMS for seizures, concern for alcohol withdrawal. PMHx significant for HTN, asthma, tuberculosis (s/p treatment at [facility]), EtOH abuse, R finger osteomyelitis with strep bacteremia.  Of note, patient has seizure history with unclear medication compliance.  Per report, patient experienced several episodes of seizure activity at home.  EMS was called and patient was continuing to seize on arrival.  On arrival to ED, patient had ongoing focal left-sided seizure activity.  He was intubated for airway protection.  Keppra load was administered and EEG was obtained.  Neurology was consulted.  PCCM was consulted for further management and ICU admission.  Pertinent Medical History:   Past Medical History:  Diagnosis Date   Arthritis    Asthma    Bilateral lower extremity edema 05/28/2021   propping feet up   Cough 05/28/2021   with clear sputum   Dyspnea    Dyspnea 05/28/2021   with exertion   EtOH dependence (HCC)    drinks 2-3 of 40ounes a day   Fibula fracture    Dr Dion Saucier 02-2011   Finger osteomyelitis, right (HCC) 05/26/2021   right index finger   Hernia    surgery 01-17-13   Hypertension    not taking bp meds since may 2022   Seizures (HCC)    Streptococcal bacteremia 09/09/2021   Tuberculosis    couple of yrs ago took tx at [facility] per pt on 05-28-2021   Significant Hospital Events: Including procedures, antibiotic start and stop dates in addition to other pertinent events   1/31 - BIB EMS after seizure at home, persistently seizing in ED with focal seizure activity of L hand. Difficult to sedate, history of EtOH abuse/withdrawal. Intubated for airway protection. C/f  ongoing seizure, Neuro consult, EEG ordered. 2/1 - burst suppression pattern on EEG 2/2 - sedation weaned with no recurrence of clinical seizures.   Interim History / Subjective:  On continuous EEG and sedation with no further seizures. Started on D10W for hypoglycemia  Objective:  Blood pressure 101/62, pulse 80, temperature 98.7 F (37.1 C), temperature source Axillary, resp. rate 16, height 6\' 1"  (1.854 m), weight 77 kg, SpO2 100 %.    Vent Mode: PRVC FiO2 (%):  [40 %] 40 % Set Rate:  [16 bmp] 16 bmp Vt Set:  [640 mL] 640 mL PEEP:  [5 cmH20] 5 cmH20 Plateau Pressure:  [16 cmH20-22 cmH20] 22 cmH20   Intake/Output Summary (Last 24 hours) at 01/07/2022 1413 Last data filed at 01/07/2022 1200 Gross per 24 hour  Intake 2821.75 ml  Output 775 ml  Net 2046.75 ml    Filed Weights   01/05/22 1729 01/06/22 0500  Weight: 77.1 kg 77 kg   Physical Examination: General: Acutely ill-appearing middle aged man in NAD. On propofol and versed infusions.  HEENT: EEG leads in place OGT/ETT in place. Neuro: No response to painful stimulation on sedation.  Slow background but no seizures seen. .  CV: RRR, no m/g/r. PULM: Chest clear bilaterally.   GI: Soft, nontender, nondistended. Normoactive bowel sounds. Tolerating tube feeds.  Extremities: No LE edema noted. Skin: Warm/dry, no rashes.  Ancillary tests personally reviewed:  EEG overnight showed slowing but no seizures.  Assessment & Plan:  Status epilepticus Acute encephalopathy, likely metabolic History of EtOH abuse C/f EtOH withdrawal Acute hypoxemic respiratory failure History of asthma GERD History of TB Hypoglycemia  Plan:   - Continue full ventilatory support. SBT once off propofol  -  Transition to precedex and off propofol. Continue fentanyl. Minimize sedation to allow for SBT. - Continue current AED. Add further agents if seizure recur.  - Increase feeds and wean off D10W.  Best Practice: (right click and  "Reselect all SmartList Selections" daily)   Diet/type: NPO - started tube feeds.  DVT prophylaxis: SCD GI prophylaxis: PPI Lines: N/A Foley:  N/A Code Status:  full code Last date of multidisciplinary goals of care discussion [Pending]    Critical care time: 40 minutes   Lynnell Catalan, MD [facility] 01/07/22 2:13 PM  Please see [email or web address] for pager details.  From 7A-7P if no response, please call [phone] After hours, please call ELink [phone]

## 2022-01-07 NOTE — Progress Notes (Signed)
eLink Physician-Brief Progress Note Patient Name: Frederick [name] DOB: 05/04/1961 MRN: [id number]   Date of Service  01/07/2022  HPI/Events of Note  Agitation - Nursing request for restraints.   eICU Interventions  Will order bilateral soft wrist restraints X 3 hours.     Intervention Category Major Interventions: Delirium, psychosis, severe agitation - evaluation and management  Ileane Sando Eugene 01/07/2022, 5:49 AM

## 2022-01-08 ENCOUNTER — Inpatient Hospital Stay (HOSPITAL_COMMUNITY): Payer: Medicare Other

## 2022-01-08 DIAGNOSIS — G40901 Epilepsy, unspecified, not intractable, with status epilepticus: Secondary | ICD-10-CM | POA: Diagnosis not present

## 2022-01-08 LAB — CBC WITH DIFFERENTIAL/PLATELET
Abs Immature Granulocytes: 0.04 10*3/uL (ref 0.00–0.07)
Basophils Absolute: 0 10*3/uL (ref 0.0–0.1)
Basophils Relative: 1 %
Eosinophils Absolute: 0.1 10*3/uL (ref 0.0–0.5)
Eosinophils Relative: 1 %
HCT: 39.4 % (ref 39.0–52.0)
Hemoglobin: 12.8 g/dL — ABNORMAL LOW (ref 13.0–17.0)
Immature Granulocytes: 1 %
Lymphocytes Relative: 16 %
Lymphs Abs: 1.2 10*3/uL (ref 0.7–4.0)
MCH: 27.9 pg (ref 26.0–34.0)
MCHC: 32.5 g/dL (ref 30.0–36.0)
MCV: 85.8 fL (ref 80.0–100.0)
Monocytes Absolute: 1.1 10*3/uL — ABNORMAL HIGH (ref 0.1–1.0)
Monocytes Relative: 15 %
Neutro Abs: 4.9 10*3/uL (ref 1.7–7.7)
Neutrophils Relative %: 66 %
Platelets: 145 10*3/uL — ABNORMAL LOW (ref 150–400)
RBC: 4.59 MIL/uL (ref 4.22–5.81)
RDW: 15.9 % — ABNORMAL HIGH (ref 11.5–15.5)
WBC: 7.4 10*3/uL (ref 4.0–10.5)
nRBC: 0 % (ref 0.0–0.2)

## 2022-01-08 LAB — GLUCOSE, CAPILLARY
Glucose-Capillary: 73 mg/dL (ref 70–99)
Glucose-Capillary: 77 mg/dL (ref 70–99)
Glucose-Capillary: 80 mg/dL (ref 70–99)
Glucose-Capillary: 83 mg/dL (ref 70–99)
Glucose-Capillary: 92 mg/dL (ref 70–99)

## 2022-01-08 LAB — MAGNESIUM: Magnesium: 2 mg/dL (ref 1.7–2.4)

## 2022-01-08 LAB — C-PEPTIDE: C-Peptide: 4.5 ng/mL — ABNORMAL HIGH (ref 1.1–4.4)

## 2022-01-08 LAB — COMPREHENSIVE METABOLIC PANEL
ALT: 7 U/L (ref 0–44)
AST: 18 U/L (ref 15–41)
Albumin: 2.3 g/dL — ABNORMAL LOW (ref 3.5–5.0)
Alkaline Phosphatase: 91 U/L (ref 38–126)
Anion gap: 7 (ref 5–15)
BUN: 16 mg/dL (ref 8–23)
CO2: 21 mmol/L — ABNORMAL LOW (ref 22–32)
Calcium: 8.1 mg/dL — ABNORMAL LOW (ref 8.9–10.3)
Chloride: 106 mmol/L (ref 98–111)
Creatinine, Ser: 0.99 mg/dL (ref 0.61–1.24)
GFR, Estimated: >60 mL/min (ref >60)
Glucose, Bld: 135 mg/dL — ABNORMAL HIGH (ref 70–99)
Potassium: 5 mmol/L (ref 3.5–5.1)
Sodium: 134 mmol/L — ABNORMAL LOW (ref 135–145)
Total Bilirubin: 0.4 mg/dL (ref 0.3–1.2)
Total Protein: 6.3 g/dL — ABNORMAL LOW (ref 6.5–8.1)

## 2022-01-08 LAB — GLUCOSE, RANDOM
Glucose, Bld: 128 mg/dL — ABNORMAL HIGH (ref 70–99)
Glucose, Bld: 126 mg/dL — ABNORMAL HIGH (ref 70–99)
Glucose, Bld: 136 mg/dL — ABNORMAL HIGH (ref 70–99)

## 2022-01-08 LAB — INSULIN, RANDOM: Insulin: 29.7 u[IU]/mL — ABNORMAL HIGH (ref 2.6–24.9)

## 2022-01-08 LAB — PHOSPHORUS: Phosphorus: 3.4 mg/dL (ref 2.5–4.6)

## 2022-01-08 MED ORDER — FOLIC ACID 1 MG PO TABS
1.0000 mg | ORAL_TABLET | Freq: Every day | ORAL | Status: DC
  Administered 2022-01-08 – 2022-01-22 (×15): 1 mg
  Filled 2022-01-08 (×15): qty 1

## 2022-01-08 MED ORDER — SENNA 8.6 MG PO TABS
1.0000 | ORAL_TABLET | Freq: Every day | ORAL | Status: DC
  Administered 2022-01-08: 8.6 mg
  Filled 2022-01-08: qty 1

## 2022-01-08 MED ORDER — LORAZEPAM 2 MG/ML IJ SOLN
2.0000 mg | Freq: Once | INTRAMUSCULAR | Status: AC
  Administered 2022-01-08: 2 mg via INTRAVENOUS

## 2022-01-08 MED ORDER — LORAZEPAM 2 MG/ML IJ SOLN
INTRAMUSCULAR | Status: AC
  Filled 2022-01-08: qty 1

## 2022-01-08 MED ORDER — LORAZEPAM 2 MG/ML IJ SOLN: 2.0000 mg | INTRAMUSCULAR | Status: DC

## 2022-01-08 MED ORDER — POLYETHYLENE GLYCOL 3350 17 G PO PACK
17.0000 g | PACK | Freq: Two times a day (BID) | ORAL | Status: DC
  Administered 2022-01-08 (×2): 17 g
  Filled 2022-01-08 (×2): qty 1

## 2022-01-08 MED ORDER — ADULT MULTIVITAMIN W/MINERALS CH
1.0000 | ORAL_TABLET | Freq: Every day | ORAL | Status: DC
  Administered 2022-01-08 – 2022-01-17 (×10): 1
  Filled 2022-01-08 (×10): qty 1

## 2022-01-08 NOTE — Progress Notes (Signed)
Nutrition Follow-up  DOCUMENTATION CODES:   Severe malnutrition in context of chronic illness  INTERVENTION:   Tube feeding via Cortrak tube: Vital 1.5 to 60 ml/h (1440 ml per day) Prosource TF 45 ml BID  Provides 2240 kcal, 119 gm protein, 1094 ml free water daily Total CHO/24 hrs 269 grams   NUTRITION DIAGNOSIS:   Severe Malnutrition related to chronic illness (TB, ETOH abuse) as evidenced by severe muscle depletion, severe fat depletion. Ongoing.   GOAL:   Patient will meet greater than or equal to 90% of their needs Met with TF at goal  MONITOR:   TF tolerance  REASON FOR ASSESSMENT:   Consult Enteral/tube feeding initiation and management  ASSESSMENT:   Pt with PMH of HTN, asthma, TB, ETOH abuse, R finger osteomyelitis with strep bacteremia admitted with status epilepticus and acute encephalopathy.   Pt discussed during ICU rounds and with RN.  Pt had cortrak placed and planning extubation. D10 stopped, CBGs in the 86s.   2/1 TF started - Vital 1.5 @ 25 ml/hr, increase by 10 ml every 8 hours to goal rate of 55 ml/hr - 2100 kcal, 122 grams protein, 1003 ml free water, 246 grams CHO 2/2 hypoglycemia continues  2/3 cortrak placed xray pending  Medications reviewed and include: folic acid, SSI, MVI with minerals, protonix, senna, thiamine Precedex  Labs reviewed: Na 134 A1C: 5.5 CBG's: 80-83    Diet Order:   Diet Order             Diet NPO time specified  Diet effective now                   EDUCATION NEEDS:   Not appropriate for education at this time  Skin:  Skin Assessment: Reviewed RN Assessment  Last BM:  unknown  Height:   Ht Readings from Last 1 Encounters:  01/05/22 '6\' 1"'  (1.854 m)    Weight:   Wt Readings from Last 1 Encounters:  01/08/22 77.4 kg    BMI:  Body mass index is 22.51 kg/m.  Estimated Nutritional Needs:   Kcal:  2100-2300  Protein:  115-130 grams  Fluid:  > 2 L/day  Lockie Pares., RD, LDN, CNSC See  AMiON for contact information

## 2022-01-08 NOTE — Progress Notes (Signed)
NAME:  Frederick [name], MRN:  [id number], DOB:  [birth year], LOS: 3 ADMISSION DATE:  01/05/2022 CONSULTATION DATE:  01/05/2022 REFERRING MD:  Silverio Lay - EDP CHIEF COMPLAINT:  Status epilepticus   History of Present Illness:  61 year old man who presented to [facility] ED 1/31 via EMS for seizures, concern for alcohol withdrawal. PMHx significant for HTN, asthma, tuberculosis (s/p treatment at [facility]), EtOH abuse, R finger osteomyelitis with strep bacteremia.  Of note, patient has seizure history with unclear medication compliance.  Per report, patient experienced several episodes of seizure activity at home.  EMS was called and patient was continuing to seize on arrival.  On arrival to ED, patient had ongoing focal left-sided seizure activity.  He was intubated for airway protection.  Keppra load was administered and EEG was obtained.  Neurology was consulted.  PCCM was consulted for further management and ICU admission.  Pertinent Medical History:   Past Medical History:  Diagnosis Date   Arthritis    Asthma    Bilateral lower extremity edema 05/28/2021   propping feet up   Cough 05/28/2021   with clear sputum   Dyspnea    Dyspnea 05/28/2021   with exertion   EtOH dependence (HCC)    drinks 2-3 of 40ounes a day   Fibula fracture    Dr Dion Saucier 02-2011   Finger osteomyelitis, right (HCC) 05/26/2021   right index finger   Hernia    surgery 01-17-13   Hypertension    not taking bp meds since may 2022   Seizures (HCC)    Streptococcal bacteremia 09/09/2021   Tuberculosis    couple of yrs ago took tx at [facility] per pt on 05-28-2021   Significant Hospital Events: Including procedures, antibiotic start and stop dates in addition to other pertinent events   1/31 - BIB EMS after seizure at home, persistently seizing in ED with focal seizure activity of L hand. Difficult to sedate, history of EtOH abuse/withdrawal. Intubated for airway protection. C/f  ongoing seizure, Neuro consult, EEG ordered. 2/1 - burst suppression pattern on EEG 2/2 - sedation weaned with no recurrence of clinical seizures.  2/3 WUA/SBT   Interim History / Subjective:  This morning, severe agitation requiring uptitration of sedation  Is tolerating PSV 5/5   Objective:  Blood pressure (!) 82/56, pulse 83, temperature 98.8 F (37.1 C), temperature source Axillary, resp. rate (!) 24, height 6\' 1"  (1.854 m), weight 77.4 kg, SpO2 99 %.    Vent Mode: PSV;CPAP FiO2 (%):  [40 %] 40 % Set Rate:  [16 bmp] 16 bmp Vt Set:  [640 mL] 640 mL PEEP:  [5 cmH20] 5 cmH20 Pressure Support:  [5 cmH20] 5 cmH20 Plateau Pressure:  [21 cmH20-26 cmH20] 21 cmH20   Intake/Output Summary (Last 24 hours) at 01/08/2022 1056 Last data filed at 01/08/2022 0900 Gross per 24 hour  Intake 3465.99 ml  Output 650 ml  Net 2815.99 ml   Filed Weights   01/05/22 1729 01/06/22 0500 01/08/22 0500  Weight: 77.1 kg 77 kg 77.4 kg   Physical Examination: General: Acutely ill middle aged M intubated sedated  HEENT: NCAT pink mm ETT secure anicteric sclera  Neuro: Awakens spontaneously moving BUE BLE 5/5. Not following commands  CV:rrr s1s2 no rgm  PULM: CTAb even unlabored on PSV  GI: soft ndnt + bowel sounds  Extremities: no acute joint deformity no cyanosis or clubbing  Skin: c/d/w no rash   Ancillary tests personally reviewed:  EEG  overnight showed slowing but no seizures.   Assessment & Plan:   Acute metabolic encephalopathy  SE, improved  Hx EtOH abuse with concern for acute withdrawal  P -Keppra per neurology -sz precautions  -cont precedex, wean sedation as able  -B Vits, Multivit  -Monitor for sx withdrawal -- if concern for active withdrawal, phenobarb vs ativan   Acute hypoxic respiratory failure  COVID-19 infection  Hx TB  Hx asthma  P -WUA/SBT -- hoping to extubate 2/3 -Cont vent wean in interim -pulm hygiene, VAP   Hypoglycemia, improving P -dc D10%, cont to  follow CBGs   GERD P -PPI    Best Practice: (right click and "Reselect all SmartList Selections" daily)   Diet/type: tubefeeds   DVT prophylaxis: prophylactic heparin  GI prophylaxis: PPI Lines: N/A Foley:  N/A Code Status:  full code Last date of multidisciplinary goals of care discussion [Pending]    CRITICAL CARE Performed by: Lanier Clam   Total critical care time: 42 minutes  Critical care time was exclusive of separately billable procedures and treating other patients. Critical care was necessary to treat or prevent imminent or life-threatening deterioration.  Critical care was time spent personally by me on the following activities: development of treatment plan with patient and/or surrogate as well as nursing, discussions with consultants, evaluation of patient's response to treatment, examination of patient, obtaining history from patient or surrogate, ordering and performing treatments and interventions, ordering and review of laboratory studies, ordering and review of radiographic studies, pulse oximetry and re-evaluation of patient's condition.  Tessie Fass MSN, AGACNP-BC [facility] Pulmonary/Critical Care Medicine Amion for pager  01/08/2022, 10:56 AM

## 2022-01-08 NOTE — Procedures (Signed)
Extubation Procedure Note  Patient Details:   Name: Frederick [name] DOB: February 06, 1961 MRN: [id number]   Airway Documentation:    Vent end date: 01/08/22 Vent end time: 1540   Evaluation  O2 sats: stable throughout Complications: No apparent complications Patient did tolerate procedure well. Bilateral Breath Sounds: Rhonchi, Diminished   No, pt could not speak post extubation.  Pt extubated to 2 l/m nasal cannula without difficulty.  Audrie Lia 01/08/2022, 3:41 PM

## 2022-01-08 NOTE — Progress Notes (Addendum)
Subjective: No clinical seizures overnight.  Patient continues to require Precedex for agitation  ROS: Unable to obtain due to poor mental status  Examination  Vital signs in last 24 hours: Temp:  [98.8 F (37.1 C)-100.4 F (38 C)] 98.8 F (37.1 C) (02/03 0400) Pulse Rate:  [74-120] 83 (02/03 0900) Resp:  [8-28] 23 (02/03 1117) BP: (82-128)/(56-80) 82/56 (02/03 0900) SpO2:  [99 %-100 %] 100 % (02/03 1117) FiO2 (%):  [40 %] 40 % (02/03 1117) Weight:  [77.4 kg] 77.4 kg (02/03 0500)  General: lying in bed, NAD CVS: pulse-normal rate and rhythm RS: Intubated, CTAB Extremities: warm, no edema Neuro: Does not open eyes to noxious stimuli, does not follow commands, pupils small and difficult appreciate any reactivity, corneal reflex intact, gag reflex absent, does not withdraw to noxious stimuli in all extremities , brisk knee jerk   Basic Metabolic Panel: Recent Labs  Lab 01/05/22 1716 01/05/22 1716 01/05/22 1720 01/05/22 1811 01/06/22 0718 01/06/22 0950 01/06/22 1640 01/06/22 1955 01/07/22 0512 01/07/22 0808 01/07/22 1523 01/07/22 1906 01/08/22 0133 01/08/22 0724  NA 135  --  138 137 133*  --   --   --  130*  --   --   --  134*  --   K 3.8  --  3.9 3.8 3.0*  --   --   --  3.8  --   --   --  5.0  --   CL 100  --  98  --  102  --   --   --  101  --   --   --  106  --   CO2 28  --   --   --  23  --   --   --  21*  --   --   --  21*  --   GLUCOSE 100*  --  98  --  119*  --   --    < > 98   95 105* 134* 126* 135*   128* 126*  BUN 7*  --  7*  --  7*  --   --   --  11  --   --   --  16  --   CREATININE 0.95  --  0.80  --  1.04  --   --   --  0.94  --   --   --  0.99  --   CALCIUM 9.1  --   --   --  9.2  --   --   --  8.0*  --   --   --  8.1*  --   MG  --    < >  --   --  1.3* 1.4* 2.0  --  1.6*  --  2.2  --  2.0  --   PHOS  --    < >  --   --  2.7 3.2 3.6  --  4.4  --  4.2  --  3.4  --    < > = values in this interval not displayed.    CBC: Recent Labs  Lab 01/05/22 1716  01/05/22 1720 01/05/22 1811 01/06/22 0718 01/07/22 0512 01/08/22 0133  WBC 6.1  --   --  6.2 6.8 7.4  NEUTROABS 3.9  --   --   --  4.4 4.9  HGB 13.1 14.6 13.6 13.0 12.5* 12.8*  HCT 41.5 43.0 40.0 38.7* 39.2 39.4  MCV 87.9  --   --  83.2 86.0 85.8  PLT 236  --   --  193 168 145*     Coagulation Studies: No results for input(s): LABPROT, INR in the last 72 hours.  Imaging No new brain imaging overnight  ASSESSMENT AND PLAN: 61 year old male with a past medical history of heavy alcohol use, seizures and history of status epilepticus who presented with convulsive status epilepticus.   Convulsive status epilepticus (resolved) Epilepsy Acute encephalopathy,post-ictal, infectious -Status epilepticus likely due to medication noncompliance.  Per history patient was taking Keppra 1 g once daily instead of prescribed twice daily -Encephalopathy likely due to postictal state, COVID   Recommendations -Okay to use Precedex and fentanyl for agitation/sedation -Continue Keppra 1000 mg twice daily -Once patient is awake, can consider switching to Keppra 2000 mg extended release once daily -If patient continues to remain altered, consider MRI brain without contrast to look for any acute abnormality -Continue seizure precautions -As needed IV Ativan 2 mg for clinical seizure-like activity -Management of rest of comorbidities per primary team -Plan was discussed with critical care team -Recommend follow-up with neurology in 6 to 8 weeks after discharge  Thank you for allowing [place] to persuade in the care of this patient.  Neurology will follow peripherally.  Please call us back for any further questions  I have spent a total of  36  minutes with the patient reviewing hospital notes,  test results, labs and examining the patient as well as establishing an assessment and plan.  > 50% of time was spent in direct patient care.       Lindie Spruce Epilepsy [facility] For  questions after 5pm please refer to AMION to reach the Neurologist on call

## 2022-01-08 NOTE — Procedures (Signed)
Cortrak ° °Person Inserting Tube:  Samhitha Rosen L, RD °Tube Type:  Cortrak - 43 inches °Tube Size:  10 °Tube Location:  Left nare °Initial Placement:  Stomach °Secured by: Bridle °Technique Used to Measure Tube Placement:  Marking at nare/corner of mouth °Cortrak Secured At:  70 cm ° °Cortrak Tube Team Note: ° °Consult received to place a Cortrak feeding tube.  ° °X-ray is required, abdominal x-ray has been ordered by the Cortrak team. Please confirm tube placement before using the Cortrak tube.  ° °If the tube becomes dislodged please keep the tube and contact the Cortrak team at [email or web address] (password [email or web address]) for replacement.  °If after hours and replacement cannot be delayed, place a NG tube and confirm placement with an abdominal x-ray.  ° ° °Thekla Colborn BS, RD, LDN °Clinical Dietitian °See AMiON for contact information.  ° ° °

## 2022-01-09 DIAGNOSIS — G40901 Epilepsy, unspecified, not intractable, with status epilepticus: Secondary | ICD-10-CM | POA: Diagnosis not present

## 2022-01-09 LAB — GLUCOSE, RANDOM
Glucose, Bld: 131 mg/dL — ABNORMAL HIGH (ref 70–99)
Glucose, Bld: 122 mg/dL — ABNORMAL HIGH (ref 70–99)
Glucose, Bld: 125 mg/dL — ABNORMAL HIGH (ref 70–99)
Glucose, Bld: 106 mg/dL — ABNORMAL HIGH (ref 70–99)

## 2022-01-09 LAB — COMPREHENSIVE METABOLIC PANEL
ALT: 11 U/L (ref 0–44)
AST: 32 U/L (ref 15–41)
Albumin: 2.4 g/dL — ABNORMAL LOW (ref 3.5–5.0)
Alkaline Phosphatase: 92 U/L (ref 38–126)
Anion gap: 7 (ref 5–15)
BUN: 15 mg/dL (ref 8–23)
CO2: 24 mmol/L (ref 22–32)
Calcium: 9 mg/dL (ref 8.9–10.3)
Chloride: 107 mmol/L (ref 98–111)
Creatinine, Ser: 0.81 mg/dL (ref 0.61–1.24)
GFR, Estimated: >60 mL/min (ref >60)
Glucose, Bld: 126 mg/dL — ABNORMAL HIGH (ref 70–99)
Potassium: 4.7 mmol/L (ref 3.5–5.1)
Sodium: 138 mmol/L (ref 135–145)
Total Bilirubin: 0.2 mg/dL — ABNORMAL LOW (ref 0.3–1.2)
Total Protein: 6.7 g/dL (ref 6.5–8.1)

## 2022-01-09 LAB — GLUCOSE, CAPILLARY
Glucose-Capillary: 94 mg/dL (ref 70–99)
Glucose-Capillary: 131 mg/dL — ABNORMAL HIGH (ref 70–99)
Glucose-Capillary: 43 mg/dL — CL (ref 70–99)
Glucose-Capillary: 51 mg/dL — ABNORMAL LOW (ref 70–99)
Glucose-Capillary: 80 mg/dL (ref 70–99)

## 2022-01-09 MED ORDER — HALOPERIDOL LACTATE 5 MG/ML IJ SOLN
5.0000 mg | Freq: Once | INTRAMUSCULAR | Status: AC
  Administered 2022-01-09: 5 mg via INTRAVENOUS
  Filled 2022-01-09: qty 1

## 2022-01-09 MED ORDER — CARVEDILOL 3.125 MG PO TABS
6.2500 mg | ORAL_TABLET | Freq: Two times a day (BID) | ORAL | Status: DC
  Filled 2022-01-09: qty 2

## 2022-01-09 MED ORDER — CARVEDILOL 6.25 MG PO TABS
6.2500 mg | ORAL_TABLET | Freq: Two times a day (BID) | ORAL | Status: DC
  Administered 2022-01-09 – 2022-01-11 (×5): 6.25 mg
  Filled 2022-01-09: qty 1
  Filled 2022-01-09 (×4): qty 2

## 2022-01-09 MED ORDER — LEVETIRACETAM ER 500 MG PO TB24
2000.0000 mg | ORAL_TABLET | Freq: Every day | ORAL | Status: DC
  Filled 2022-01-09: qty 4

## 2022-01-09 MED ORDER — HALOPERIDOL LACTATE 5 MG/ML IJ SOLN
5.0000 mg | Freq: Four times a day (QID) | INTRAMUSCULAR | Status: DC | PRN
  Administered 2022-01-09: 5 mg via INTRAVENOUS
  Filled 2022-01-09 (×2): qty 1

## 2022-01-09 NOTE — Progress Notes (Signed)
NAME:  GIDEON BURSTEIN, MRN:  [id number], DOB:  [birth year], LOS: 4 ADMISSION DATE:  01/05/2022 CONSULTATION DATE:  01/05/2022 REFERRING MD:  Silverio Lay - EDP CHIEF COMPLAINT:  Status epilepticus   History of Present Illness:  61 year old man who presented to [facility] ED 1/31 via EMS for seizures, concern for alcohol withdrawal. PMHx significant for HTN, asthma, tuberculosis (s/p treatment at [facility]), EtOH abuse, R finger osteomyelitis with strep bacteremia.  Of note, patient has seizure history with unclear medication compliance.  Per report, patient experienced several episodes of seizure activity at home.  EMS was called and patient was continuing to seize on arrival.  On arrival to ED, patient had ongoing focal left-sided seizure activity.  He was intubated for airway protection.  Keppra load was administered and EEG was obtained.  Neurology was consulted.  PCCM was consulted for further management and ICU admission.  Pertinent Medical History:   Past Medical History:  Diagnosis Date   Arthritis    Asthma    Bilateral lower extremity edema 05/28/2021   propping feet up   Cough 05/28/2021   with clear sputum   Dyspnea    Dyspnea 05/28/2021   with exertion   EtOH dependence (HCC)    drinks 2-3 of 40ounes a day   Fibula fracture    Dr Dion Saucier 02-2011   Finger osteomyelitis, right (HCC) 05/26/2021   right index finger   Hernia    surgery 01-17-13   Hypertension    not taking bp meds since may 2022   Seizures (HCC)    Streptococcal bacteremia 09/09/2021   Tuberculosis    couple of yrs ago took tx at [facility] per pt on 05-28-2021   Significant Hospital Events: Including procedures, antibiotic start and stop dates in addition to other pertinent events   1/31 - BIB EMS after seizure at home, persistently seizing in ED with focal seizure activity of L hand. Difficult to sedate, history of EtOH abuse/withdrawal. Intubated for airway protection. C/f  ongoing seizure, Neuro consult, EEG ordered. 2/1 - burst suppression pattern on EEG 2/2 - sedation weaned with no recurrence of clinical seizures.  2/3 WUA/SBT   Interim History / Subjective:  This morning, severe agitation requiring uptitration of sedation  Is tolerating PSV 5/5   Objective:  Blood pressure 133/81, pulse 72, temperature 98.7 F (37.1 C), temperature source Axillary, resp. rate 18, height 6\' 1"  (1.854 m), weight 77.5 kg, SpO2 100 %.    Vent Mode: PSV;CPAP FiO2 (%):  [40 %] 40 % Pressure Support:  [5 cmH20] 5 cmH20   Intake/Output Summary (Last 24 hours) at 01/09/2022 03/09/2022 Last data filed at 01/09/2022 0800 Gross per 24 hour  Intake 2067.35 ml  Output 2550 ml  Net -482.65 ml    Filed Weights   01/06/22 0500 01/08/22 0500 01/09/22 0500  Weight: 77 kg 77.4 kg 77.5 kg   Physical Examination: General: Chronically ill middle aged M, lying in bed HEENT: upper lip slightly swollen Neuro: Alert, conversant CV:rrr s1s2 no rgm  PULM: CTAb even unlabored on PSV  GI: soft ndnt + bowel sounds  Extremities: no acute joint deformity no cyanosis or clubbing  Skin: c/d/w no rash   Ancillary tests personally reviewed:    Assessment & Plan:   Acute metabolic encephalopathy  SE, improved  Hx EtOH abuse with concern for acute withdrawal  P -Keppra IV BID, converting to oral daily 2/5 per neurology -sz precautions  -wean off precedex -B Vits,  Multivit  -Monitor for sx withdrawal -- if concern for active withdrawal, ativan   Acute hypoxic respiratory failure in setting of post ictal state COVID-19 infection (incidental) Hx TB  Hx asthma  P -Extubated 2/3 -Cont vent wean in interim -pulm hygiene, VAP   Hypoglycemia, resolved  GERD P -PPI   Diarrhea: Following starting bowel regimen 2/3 --stop bowel regimen  Best Practice: (right click and "Reselect all SmartList Selections" daily)   Diet/type: tubefeeds   DVT prophylaxis: prophylactic heparin  GI  prophylaxis: PPI Lines: N/A Foley:  N/A Code Status:  full code Last date of multidisciplinary goals of care discussion [Pending]    CRITICAL CARE N/a  Karren Burly, MD [facility] Pulmonary/Critical Care Medicine Amion for contact info  01/09/2022, 9:06 AM

## 2022-01-09 NOTE — Progress Notes (Signed)
OT Cancellation Note  Patient Details Name: Frederick [name] MRN: [id number] DOB: 1961-04-23   Cancelled Treatment:    Reason Eval/Treat Not Completed: Patient not medically ready.  RN advises to hold due to increased agitation.  OT to continue efforts as needed.    Zidane Renner D Julene Rahn 01/09/2022, 10:35 AM

## 2022-01-09 NOTE — Progress Notes (Signed)
Utilizing glucose levels from lab draws again due to low blood capillary sugars earlier in the shift.

## 2022-01-09 NOTE — Progress Notes (Signed)
PT Cancellation Note  Patient Details Name: Frederick [name] MRN: [id number] DOB: 12-28-1960   Cancelled Treatment:    Reason Eval/Treat Not Completed: Patient not medically ready  Per RN, recently went up on precedex and gave ativan due to agitation. PT not yet appropriate. Will monitor for readiness to proceed with incr activity.   Jerolyn Center, PT Acute Rehabilitation Services  Pager [phone] Office [phone]   Zena Amos 01/09/2022, 9:59 AM

## 2022-01-10 DIAGNOSIS — G40901 Epilepsy, unspecified, not intractable, with status epilepticus: Secondary | ICD-10-CM | POA: Diagnosis not present

## 2022-01-10 LAB — GLUCOSE, CAPILLARY
Glucose-Capillary: 111 mg/dL — ABNORMAL HIGH (ref 70–99)
Glucose-Capillary: 122 mg/dL — ABNORMAL HIGH (ref 70–99)
Glucose-Capillary: 127 mg/dL — ABNORMAL HIGH (ref 70–99)
Glucose-Capillary: 135 mg/dL — ABNORMAL HIGH (ref 70–99)

## 2022-01-10 LAB — GLUCOSE, RANDOM
Glucose, Bld: 131 mg/dL — ABNORMAL HIGH (ref 70–99)
Glucose, Bld: 134 mg/dL — ABNORMAL HIGH (ref 70–99)
Glucose, Bld: 124 mg/dL — ABNORMAL HIGH (ref 70–99)
Glucose, Bld: 122 mg/dL — ABNORMAL HIGH (ref 70–99)
Glucose, Bld: 125 mg/dL — ABNORMAL HIGH (ref 70–99)

## 2022-01-10 MED ORDER — INSULIN ASPART 100 UNIT/ML IJ SOLN: 0.0000 [IU] | INTRAMUSCULAR | Status: DC

## 2022-01-10 MED ORDER — LEVETIRACETAM IN NACL 1000 MG/100ML IV SOLN
1000.0000 mg | Freq: Two times a day (BID) | INTRAVENOUS | Status: DC
  Administered 2022-01-10 – 2022-01-12 (×4): 1000 mg via INTRAVENOUS
  Filled 2022-01-10 (×5): qty 100

## 2022-01-10 MED ORDER — OLANZAPINE 5 MG PO TBDP: 10.0000 mg | ORAL_TABLET | Freq: Two times a day (BID) | ORAL | Status: DC

## 2022-01-10 MED ORDER — OLANZAPINE 10 MG PO TABS
10.0000 mg | ORAL_TABLET | Freq: Two times a day (BID) | ORAL | Status: DC
  Administered 2022-01-10 – 2022-01-11 (×3): 10 mg
  Filled 2022-01-10 (×4): qty 1

## 2022-01-10 NOTE — Progress Notes (Signed)
NAME:  Frederick [name], MRN:  [id number], DOB:  [birth year], LOS: 5 ADMISSION DATE:  01/05/2022 CONSULTATION DATE:  01/05/2022 REFERRING MD:  Silverio Lay - EDP CHIEF COMPLAINT:  Status epilepticus   History of Present Illness:  61 year old man who presented to [facility] ED 1/31 via EMS for seizures, concern for alcohol withdrawal. PMHx significant for HTN, asthma, tuberculosis (s/p treatment at [facility] Dept), EtOH abuse, R finger osteomyelitis with strep bacteremia.  Of note, patient has seizure history with unclear medication compliance.  Per report, patient experienced several episodes of seizure activity at home.  EMS was called and patient was continuing to seize on arrival.  On arrival to ED, patient had ongoing focal left-sided seizure activity.  He was intubated for airway protection.  Keppra load was administered and EEG was obtained.  Neurology was consulted.  PCCM was consulted for further management and ICU admission.  Pertinent Medical History:   Past Medical History:  Diagnosis Date   Arthritis    Asthma    Bilateral lower extremity edema 05/28/2021   propping feet up   Cough 05/28/2021   with clear sputum   Dyspnea    Dyspnea 05/28/2021   with exertion   EtOH dependence (HCC)    drinks 2-3 of 40ounes a day   Fibula fracture    Dr Dion Saucier 02-2011   Finger osteomyelitis, right (HCC) 05/26/2021   right index finger   Hernia    surgery 01-17-13   Hypertension    not taking bp meds since may 2022   Seizures (HCC)    Streptococcal bacteremia 09/09/2021   Tuberculosis    couple of yrs ago took tx at [facility] per pt on 05-28-2021   Significant Hospital Events: Including procedures, antibiotic start and stop dates in addition to other pertinent events   1/31 - BIB EMS after seizure at home, persistently seizing in ED with focal seizure activity of L hand. Difficult to sedate, history of EtOH abuse/withdrawal. Intubated for airway protection. C/f  ongoing seizure, Neuro consult, EEG ordered. 2/1 - burst suppression pattern on EEG 2/2 - sedation weaned with no recurrence of clinical seizures.  2/3 WUA/SBT, extubated  Interim History / Subjective:  NAEON, still on precedex for agitation, appears delirious, EtOH withdrawal considered  Objective:  Blood pressure 140/74, pulse 75, temperature 97.8 F (36.6 C), temperature source Axillary, resp. rate (!) 23, height 6\' 1"  (1.854 m), weight 77.4 kg, SpO2 100 %.        Intake/Output Summary (Last 24 hours) at 01/10/2022 1001 Last data filed at 01/10/2022 0900 Gross per 24 hour  Intake 1502.14 ml  Output 700 ml  Net 802.14 ml    Filed Weights   01/08/22 0500 01/09/22 0500 01/10/22 0500  Weight: 77.4 kg 77.5 kg 77.4 kg   Physical Examination: General: Chronically ill middle aged M, lying in bed HEENT: upper lip slightly swollen Neuro: Alert, conversant CV:rrr s1s2 no rgm  PULM: coarse, junky GI: soft ndnt + bowel sounds  Extremities: no acute joint deformity no cyanosis or clubbing  Skin: c/d/w no rash   Ancillary tests personally reviewed:    Assessment & Plan:   Acute metabolic encephalopathy  SE, improved  Hx EtOH abuse  Delirium P -Keppra, converting to oral daily 2/5 per neurology -sz precautions  -wean off precedex, Haldol PRN, addition of zyprexa BID 2/5 (EKG ordered 2/5) -B Vits, Multivit  -Monitor for sx withdrawal -- if concern for active withdrawal, ativan  -SLP eval for  swallow  Acute hypoxic respiratory failure in setting of post ictal state COVID-19 infection (incidental) Hx TB  Hx asthma  P -Extubated 2/3  Hypoglycemia, resolved  GERD P -PPI   Diarrhea: Following starting bowel regimen 2/3 --stop bowel regimen  Best Practice: (right click and "Reselect all SmartList Selections" daily)   Diet/type: tubefeeds   DVT prophylaxis: prophylactic heparin  GI prophylaxis: PPI Lines: N/A Foley:  N/A Code Status:  full code Last date of  multidisciplinary goals of care discussion [Pending]    CRITICAL CARE N/a  Karren Burly, MD [facility] Pulmonary/Critical Care Medicine Amion for contact info  01/10/2022, 10:01 AM

## 2022-01-10 NOTE — Evaluation (Signed)
Clinical/Bedside Swallow Evaluation Patient Details  Name: Frederick [name] MRN: [id number] Date of Birth: 11-05-1961  Today's Date: 01/10/2022 Time: SLP Start Time (ACUTE ONLY): 1330 SLP Stop Time (ACUTE ONLY): 1346 SLP Time Calculation (min) (ACUTE ONLY): 16 min  Past Medical History:  Past Medical History:  Diagnosis Date   Arthritis    Asthma    Bilateral lower extremity edema 05/28/2021   propping feet up   Cough 05/28/2021   with clear sputum   Dyspnea    Dyspnea 05/28/2021   with exertion   EtOH dependence (HCC)    drinks 2-3 of 40ounes a day   Fibula fracture    Dr Dion Saucier 02-2011   Finger osteomyelitis, right (HCC) 05/26/2021   right index finger   Hernia    surgery 01-17-13   Hypertension    not taking bp meds since may 2022   Seizures (HCC)    Streptococcal bacteremia 09/09/2021   Tuberculosis    couple of yrs ago took tx at [facility] per Frederick [name] on 05-28-2021   Past Surgical History:  Past Surgical History:  Procedure Laterality Date   AMPUTATION Right 05/29/2021   Procedure: PARTIAL RIGHT 2ND FINGER AMPUTATION;  Surgeon: Kathryne Hitch, MD;  Location: [facility];  Service: Orthopedics;  Laterality: Right;   ANKLE SURGERY     INGUINAL HERNIA REPAIR Right 01/19/2013   Procedure: HERNIA REPAIR INGUINAL ADULT;  Surgeon: [name], MD,FACS;  Location: [facility] OR;  Service: General;  Laterality: Right;   TEE WITHOUT CARDIOVERSION N/A 08/14/2021   Procedure: TRANSESOPHAGEAL ECHOCARDIOGRAM (TEE);  Surgeon: Wendall Stade, MD;  Location: [facility];  Service: Cardiovascular;  Laterality: N/A;   TIBIA IM NAIL INSERTION Left 01/22/2013   Procedure: INTRAMEDULLARY (IM) NAIL TIBIAL;  Surgeon: [name], MD;  Location: [facility] OR;  Service: Orthopedics;  Laterality: Left;   HPI:  Frederick [name] is a 61 year old male who presented to [facility] ED 1/31 via for seizures, concern for alcohol withdrawal. On arrival to ED, patient had ongoing focal left-sided  seizure activity. He was loaded with Keppra and intubated for airway protection. ETT 1/31-2/3. EEG 2/2: severe diffuse encephalopathy. No seizures or epileptiform discharges. Frederick [name] found to have COVID-19. PMH: HTN, asthma, tuberculosis (s/p treatment at [facility] Dept), GERD, EtOH abuse, R finger osteomyelitis with strep bacteremia. Last seen by SLP September, 2022 with recommendation for regular texture solids and thin liquids    Assessment / Plan / Recommendation  Clinical Impression  Frederick [name] was seen for bedside swallow evaluation. Frederick [name] was adequately alert for evaluation, but his responses were often unrelated to questions and speech intelligibility was moderately reduced due to reduced articulatory precision. Oral mechanism exam was limited due to Frederick [name]'s difficulty following commands; however, he presented with adequate, natural dentition and lingual ROM was adequate. A wet vocal quality was noted at baseline, suggesting reduced secretion management. Frederick [name] demonstrated symptoms of oropharyngeal dysphagia characterized by reduced bolus awareness with anterior spillage of liquids and solids, oral holding, and signs of aspiration with ice chips, puree and thin liquids. A pharyngeal delay is suspected considering time of bolus presentation versus that of hyolaryngeal movement. It is recommended that the Frederick [name]'s NPO status be maintained with continued use of the cortrak for nutrition and meds. If alert, Frederick [name] may have individual ice chips after oral care. SLP will follow Frederick [name] to assess improvement in swallow function. SLP Visit Diagnosis: Dysphagia, unspecified (R13.10)    Aspiration Risk  Severe aspiration risk;Risk for inadequate  nutrition/hydration    Diet Recommendation NPO (It adequately alert, Frederick [name] may have limited (i.e. ~3-4 at a time) individual ice chips with full supervision)   Medication Administration: Via alternative means    Other  Recommendations Oral Care Recommendations: Oral care QID;Oral care  prior to ice chip/H20    Recommendations for follow up therapy are one component of a multi-disciplinary discharge planning process, led by the attending physician.  Recommendations may be updated based on patient status, additional functional criteria and insurance authorization.  Follow up Recommendations  (TBD)      Assistance Recommended at Discharge    Functional Status Assessment Patient has had a recent decline in their functional status and demonstrates the ability to make significant improvements in function in a reasonable and predictable amount of time.  Frequency and Duration min 2x/week  2 weeks       Prognosis Prognosis for Safe Diet Advancement: Good Barriers to Reach Goals: Cognitive deficits;Severity of deficits      Swallow Study   General Date of Onset: 01/08/22 HPI: Frederick [name] is a 61 year old male who presented to [facility] ED 1/31 via for seizures, concern for alcohol withdrawal. On arrival to ED, patient had ongoing focal left-sided seizure activity. He was loaded with Keppra and intubated for airway protection. ETT 1/31-2/3. EEG 2/2: severe diffuse encephalopathy. No seizures or epileptiform discharges. Frederick [name] found to have COVID-19. PMH: HTN, asthma, tuberculosis (s/p treatment at [facility]), GERD, EtOH abuse, R finger osteomyelitis with strep bacteremia. Last seen by SLP September, 2022 with recommendation for regular texture solids and thin liquids Type of Study: Bedside Swallow Evaluation Previous Swallow Assessment: See HPI Diet Prior to this Study: NPO Temperature Spikes Noted: No Respiratory Status: Room air History of Recent Intubation: Yes Length of Intubations (days): 3 days Date extubated: 01/08/22 Behavior/Cognition: Lethargic/Drowsy;Cooperative Oral Cavity Assessment: Dry Oral Care Completed by SLP: Recent completion by staff Self-Feeding Abilities: Total assist Patient Positioning: Upright in bed;Postural control adequate for testing Baseline  Vocal Quality: Wet Volitional Cough: Weak;Wet Volitional Swallow: Unable to elicit    Oral/Motor/Sensory Function Overall Oral Motor/Sensory Function:  (Assessment limited)   Ice Chips Ice chips: Impaired Presentation: Spoon Oral Phase Impairments: Poor awareness of bolus Oral Phase Functional Implications: Right anterior spillage;Oral residue   Thin Liquid Thin Liquid: Impaired Presentation: Spoon Oral Phase Impairments: Poor awareness of bolus;Reduced lingual movement/coordination Oral Phase Functional Implications: Right lateral sulci pocketing Pharyngeal  Phase Impairments: Wet Vocal Quality;Throat Clearing - Immediate;Cough - Delayed    Nectar Thick Nectar Thick Liquid: Not tested   Honey Thick Honey Thick Liquid: Not tested   Puree Puree: Impaired Presentation: Spoon Oral Phase Impairments: Poor awareness of bolus Oral Phase Functional Implications: Oral holding Pharyngeal Phase Impairments: Throat Clearing - Immediate   Solid     Solid: Not tested     Sakiyah Shur I. Vear Clock, MS, CCC-SLP Acute Rehabilitation Services Office number [phone] Pager [phone]  Scheryl Marten 01/10/2022,1:56 PM

## 2022-01-10 NOTE — Progress Notes (Signed)
SLP Cancellation Note  Patient Details Name: Frederick [name] MRN: [id number] DOB: Jan 11, 1961   Cancelled treatment:       Reason Eval/Treat Not Completed:  (Pt with working with therapy and nursing at this time. SLP will follow up.)  Lourdes Kucharski I. Vear Clock, MS, CCC-SLP Acute Rehabilitation Services Office number [phone] Pager [phone]  Scheryl Marten 01/10/2022, 12:27 PM

## 2022-01-10 NOTE — Evaluation (Signed)
Physical Therapy Evaluation Patient Details Name: Frederick [name] MRN: [id number] DOB: May 26, 1961 Today's Date: 01/10/2022  History of Present Illness  61 year old man who presented to [facility] ED 1/31 via EMS for seizures, concern for alcohol withdrawal. COVID +; Intubated 01/05/22 for airway protection due to seizures; Cortak placed 01/08/22; extubated 01/08/22; agitation with ?alcohol withdrawal  PMHx significant for HTN, asthma, tuberculosis), EtOH abuse, R finger osteomyelitis with strep bacteremia, seizures with ?med compliance.  Clinical Impression   Pt admitted secondary to problem above with deficits below. Very limited evaluation due to pt's lethargy. Per RN, had been restless earlier in the day and had turned down his precedex, however pt barely arousable and unable to understand his attempts at speech. Unclear what pt's prior functional status is, and currently requires +2 total assist for bed mobility and unsafe to progress to EOB/OOB.  Anticipate patient will benefit from PT as he becomes more alert and able to participate and will address problems listed below. Will continue to follow acutely to maximize functional mobility independence and safety and assist with discharge disposition planning.          Recommendations for follow up therapy are one component of a multi-disciplinary discharge planning process, led by the attending physician.  Recommendations may be updated based on patient status, additional functional criteria and insurance authorization.  Follow Up Recommendations Other (comment) (TBA as pt more alert, participatory)    Assistance Recommended at Discharge Frequent or constant Supervision/Assistance  Patient can return home with the following  Two people to help with walking and/or transfers;Two people to help with bathing/dressing/bathroom;Assistance with cooking/housework;Assistance with feeding;Direct supervision/assist for medications management;Direct supervision/assist  for financial management;Assist for transportation;Help with stairs or ramp for entrance    Equipment Recommendations Other (comment) (TBA as pt more participatory)  Recommendations for Other Services       Functional Status Assessment Patient has had a recent decline in their functional status and demonstrates the ability to make significant improvements in function in a reasonable and predictable amount of time.     Precautions / Restrictions Precautions Precautions: Fall Restrictions Weight Bearing Restrictions: No      Mobility  Bed Mobility Overal bed mobility: Needs Assistance Bed Mobility: Rolling Rolling: Total assist, +2 for physical assistance         General bed mobility comments: rolling rt and lt with no participation and no increased arousal; sat upright via bed with no increased arousal or participation    Transfers                   General transfer comment: unsafe due to lethargy    Ambulation/Gait                  Stairs            Wheelchair Mobility    Modified Rankin (Stroke Patients Only)       Balance                                             Pertinent Vitals/Pain Pain Assessment Facial Expression: Relaxed, neutral Body Movements: Absence of movements Muscle Tension: Relaxed Compliance with ventilator (intubated pts.): N/A Vocalization (extubated pts.): Talking in normal tone or no sound CPOT Total: 0    Home Living Family/patient expects to be discharged to:: Private residence  Additional Comments: pt too sedated to participate/provide information    Prior Function Prior Level of Function : Patient poor historian/Family not available                     Hand Dominance   Dominant Hand: Right    Extremity/Trunk Assessment   Upper Extremity Assessment Upper Extremity Assessment: Defer to OT evaluation    Lower Extremity Assessment Lower Extremity  Assessment: Difficult to assess due to impaired cognition    Cervical / Trunk Assessment Cervical / Trunk Assessment: Other exceptions Cervical / Trunk Exceptions: abdomen distended; RN aware  Communication   Communication: Expressive difficulties  Cognition Arousal/Alertness: Lethargic, Suspect due to medications (pt remains on precedex, although they have been reducing) Behavior During Therapy: Flat affect Overall Cognitive Status: Difficult to assess                                 General Comments: speech very garbled; mouth with dry secretions        General Comments General comments (skin integrity, edema, etc.): RN felt patient would be appropriate for therapy to evaluate as he had been restless earlier in the day and she had been going down on his precedex. Pt opened eyes to name several times, but would not follow any commands with extremities.    Exercises     Assessment/Plan    PT Assessment Patient needs continued PT services  PT Problem List Decreased strength;Decreased activity tolerance;Decreased balance;Decreased mobility;Decreased cognition;Decreased safety awareness       PT Treatment Interventions DME instruction;Gait training;Functional mobility training;Therapeutic activities;Stair training;Therapeutic exercise;Balance training;Cognitive remediation;Patient/family education    PT Goals (Current goals can be found in the Care Plan section)  Acute Rehab PT Goals Patient Stated Goal: unable to state PT Goal Formulation: Patient unable to participate in goal setting Time For Goal Achievement: 01/24/22 Potential to Achieve Goals: Fair    Frequency Min 2X/week     Co-evaluation PT/OT/SLP Co-Evaluation/Treatment: Yes Reason for Co-Treatment: Complexity of the patient's impairments (multi-system involvement)           AM-PAC PT "6 Clicks" Mobility  Outcome Measure Help needed turning from your back to your side while in a flat bed  without using bedrails?: Total Help needed moving from lying on your back to sitting on the side of a flat bed without using bedrails?: Total Help needed moving to and from a bed to a chair (including a wheelchair)?: Total Help needed standing up from a chair using your arms (e.g., wheelchair or bedside chair)?: Total Help needed to walk in hospital room?: Total Help needed climbing 3-5 steps with a railing? : Total 6 Click Score: 6    End of Session Equipment Utilized During Treatment: Oxygen Activity Tolerance: Patient limited by lethargy Patient left: in bed;with nursing/sitter in room Nurse Communication: Other (comment) (lethargy limits safe mobilization at this time) PT Visit Diagnosis: Muscle weakness (generalized) (M62.81);Difficulty in walking, not elsewhere classified (R26.2)    Time: 1201-1224 PT Time Calculation (min) (ACUTE ONLY): 23 min   Charges:   PT Evaluation $PT Eval Low Complexity: 1 Low           Jerolyn Center, PT Acute Rehabilitation Services  Pager [phone] Office [phone]   Zena Amos 01/10/2022, 1:51 PM

## 2022-01-10 NOTE — Evaluation (Signed)
Occupational Therapy Evaluation Patient Details Name: Frederick [name] MRN: [id number] DOB: 06-09-1961 Today's Date: 01/10/2022   History of Present Illness 61 year old man who presented to [facility] ED 1/31 via EMS for seizures, concern for alcohol withdrawal. COVID +; Intubated 01/05/22 for airway protection due to seizures; Cortak placed 01/08/22; extubated 01/08/22; agitation with ?alcohol withdrawal  PMHx significant for HTN, asthma, tuberculosis), EtOH abuse, R finger osteomyelitis with strep bacteremia, seizures with ?med compliance.   Clinical Impression   Spoke with Ms Anne Fu (pt's significant other) over the phone for PLOF. She states that she & Frederick [name] live in a 2nd floor  apt (she is trying to get a first floor apt) and at baseline he gets around primarily with a wc, however uses a RW/holds onto walls when walking to the bathroom from his bedroom. She assists him as needed with ADL tasks. She states Frederick [name] as has been getting weaker over the last month, has had increased confusion and that he had to have assistance/sometimes ambulance transport getting up/down the stairs for any appointments. Girlfriend states he has not had any alcohol to drink "since last September". Pt very lethargic as noted on PT eval and currently requires total A + 2 for mobility and ADL tasks. Anticipate pt will need rehab at [facility]. Acute OT to follow.      Recommendations for follow up therapy are one component of a multi-disciplinary discharge planning process, led by the attending physician.  Recommendations may be updated based on patient status, additional functional criteria and insurance authorization.   Follow Up Recommendations  Skilled nursing-short term rehab (<3 hours/day) (will further assess)    Assistance Recommended at Discharge Frequent or constant Supervision/Assistance (will assess)  Patient can return home with the following      Functional Status Assessment  Patient has had a recent decline in their  functional status and demonstrates the ability to make significant improvements in function in a reasonable and predictable amount of time.  Equipment Recommendations  Other (comment) (will assess)    Recommendations for Other Services       Precautions / Restrictions Precautions Precautions: Fall Precaution Comments: waist, B wrist restraints and mittens Restrictions Weight Bearing Restrictions: No      Mobility Bed Mobility Overal bed mobility: Needs Assistance Bed Mobility: Rolling Rolling: Total assist, +2 for physical assistance         General bed mobility comments: rolling rt and lt with no participation and no increased arousal; sat upright via bed with no increased arousal or participation    Transfers                   General transfer comment: unsafe due to lethargy      Balance                                           ADL either performed or assessed with clinical judgement   ADL Overall ADL's : Needs assistance/impaired                                       General ADL Comments: total A; NPO     Vision   Vision Assessment?:  (no deficits at baseline per significant other) Additional Comments: eyes crustted; closed 99% of session     Perception  Praxis      Pertinent Vitals/Pain Pain Assessment Pain Assessment: Faces Faces Pain Scale: Hurts little more Pain Location: generalized Pain Descriptors / Indicators: Grimacing Pain Intervention(s): Limited activity within patient's tolerance     Hand Dominance Right   Extremity/Trunk Assessment Upper Extremity Assessment Upper Extremity Assessment: Generalized weakness;Difficult to assess due to impaired cognition (BUE PROM WFL; amputated finger on the R; moving BUE spontaneously)   Lower Extremity Assessment Lower Extremity Assessment: Defer to PT evaluation   Cervical / Trunk Assessment Cervical / Trunk Assessment: Other exceptions Cervical /  Trunk Exceptions: abdomen distended; RN aware   Communication Communication Communication: Expressive difficulties (at baseline no deficits)   Cognition Arousal/Alertness: Lethargic, Suspect due to medications (pt remains on precedex, although they have been reducing) Behavior During Therapy: Flat affect Overall Cognitive Status: Difficult to assess                                 General Comments: speech very garbled; mouth with dry secretions     General Comments  RN felt patient would be appropriate for therapy to evaluate as he had been restless earlier in the day and she had been going down on his precedex. Pt opened eyes to name several times, but would not follow any commands with extremities.Open area on sacrum - nsg placed Mepalex    Exercises     Shoulder Instructions      Home Living Family/patient expects to be discharged to:: Private residence Living Arrangements: Spouse/significant other Available Help at Discharge: Available PRN/intermittently Type of Home: Apartment Home Access: Stairs to enter Entrance Stairs-Number of Steps: flight   Home Layout: One level         Bathroom Toilet: Standard Bathroom Accessibility: No   Home Equipment: Agricultural consultant (2 wheels);Wheelchair - manual;Wheelchair - power;BSC/3in1   Additional Comments: pt too sedated to participate/provide information      Prior Functioning/Environment Prior Level of Function : Needs assist       Physical Assist : Mobility (physical);ADLs (physical) Mobility (physical): Stairs ADLs (physical): Bathing;Dressing;Toileting;IADLs (significant other helped as needed) Mobility Comments: primarily used wc in the house and the RW/held onto walls to go to the bathroom          OT Problem List: Decreased activity tolerance;Decreased strength;Decreased cognition;Decreased safety awareness;Impaired UE functional use      OT Treatment/Interventions: Self-care/ADL  training;Therapeutic exercise;Neuromuscular education;DME and/or AE instruction;Energy conservation;Therapeutic activities;Cognitive remediation/compensation;Visual/perceptual remediation/compensation;Patient/family education;Balance training    OT Goals(Current goals can be found in the care plan section) Acute Rehab OT Goals Patient Stated Goal: per girlfriend to get rehba before he goes home OT Goal Formulation: With family Time For Goal Achievement: 01/24/22 Potential to Achieve Goals: Fair ADL Goals Pt Will Perform Grooming: with mod assist;bed level Additional ADL Goal #1: Pt will follow 50% of 1 step commands to increase activity participation for ADL tasks  OT Frequency: Min 2X/week    Co-evaluation PT/OT/SLP Co-Evaluation/Treatment: Yes Reason for Co-Treatment: Complexity of the patient's impairments (multi-system involvement);Necessary to address cognition/behavior during functional activity;For patient/therapist safety   OT goals addressed during session: ADL's and self-care      AM-PAC OT "6 Clicks" Daily Activity     Outcome Measure Help from another person eating meals?: Total Help from another person taking care of personal grooming?: Total Help from another person toileting, which includes using toliet, bedpan, or urinal?: Total Help from another person bathing (including washing, rinsing, drying)?:  Total Help from another person to put on and taking off regular upper body clothing?: Total Help from another person to put on and taking off regular lower body clothing?: Total 6 Click Score: 6   End of Session Nurse Communication: Other (comment) (chair position in bed when able)  Activity Tolerance: Patient limited by lethargy Patient left: in bed;with call bell/phone within reach;with bed alarm set;with nursing/sitter in room;with restraints reapplied  OT Visit Diagnosis: Other abnormalities of gait and mobility (R26.89);Muscle weakness (generalized) (M62.81);Other  symptoms and signs involving cognitive function                Time: 1201-1224 OT Time Calculation (min): 23 min Charges:  OT General Charges $OT Visit: 1 Visit OT Evaluation $OT Eval Moderate Complexity: 1 Mod  Jarnell Cordaro, OT/L   Acute OT Clinical Specialist Acute Rehabilitation Services Pager [phone] Office [phone]   [facility] 01/10/2022, 3:21 PM

## 2022-01-11 ENCOUNTER — Inpatient Hospital Stay (HOSPITAL_COMMUNITY): Payer: Medicare Other

## 2022-01-11 DIAGNOSIS — G40901 Epilepsy, unspecified, not intractable, with status epilepticus: Secondary | ICD-10-CM | POA: Diagnosis not present

## 2022-01-11 LAB — URINALYSIS, COMPLETE (UACMP) WITH MICROSCOPIC
Bilirubin Urine: NEGATIVE
Glucose, UA: NEGATIVE mg/dL
Hgb urine dipstick: NEGATIVE
Ketones, ur: NEGATIVE mg/dL
Leukocytes,Ua: NEGATIVE
Nitrite: POSITIVE — AB
Protein, ur: NEGATIVE mg/dL
Specific Gravity, Urine: 1.02 (ref 1.005–1.030)
pH: 6 (ref 5.0–8.0)

## 2022-01-11 LAB — CBC WITH DIFFERENTIAL/PLATELET
Abs Immature Granulocytes: 0.06 10*3/uL (ref 0.00–0.07)
Basophils Absolute: 0 10*3/uL (ref 0.0–0.1)
Basophils Relative: 0 %
Eosinophils Absolute: 0 10*3/uL (ref 0.0–0.5)
Eosinophils Relative: 1 %
HCT: 36.8 % — ABNORMAL LOW (ref 39.0–52.0)
Hemoglobin: 11.6 g/dL — ABNORMAL LOW (ref 13.0–17.0)
Immature Granulocytes: 1 %
Lymphocytes Relative: 21 %
Lymphs Abs: 1.2 10*3/uL (ref 0.7–4.0)
MCH: 26.9 pg (ref 26.0–34.0)
MCHC: 31.5 g/dL (ref 30.0–36.0)
MCV: 85.4 fL (ref 80.0–100.0)
Monocytes Absolute: 1 10*3/uL (ref 0.1–1.0)
Monocytes Relative: 17 %
Neutro Abs: 3.6 10*3/uL (ref 1.7–7.7)
Neutrophils Relative %: 60 %
Platelets: 167 10*3/uL (ref 150–400)
RBC: 4.31 MIL/uL (ref 4.22–5.81)
RDW: 15.8 % — ABNORMAL HIGH (ref 11.5–15.5)
WBC: 6 10*3/uL (ref 4.0–10.5)
nRBC: 0 % (ref 0.0–0.2)

## 2022-01-11 LAB — MAGNESIUM: Magnesium: 1.6 mg/dL — ABNORMAL LOW (ref 1.7–2.4)

## 2022-01-11 LAB — BLOOD GAS, VENOUS
Acid-Base Excess: 4.4 mmol/L — ABNORMAL HIGH (ref 0.0–2.0)
Bicarbonate: 30 mmol/L — ABNORMAL HIGH (ref 20.0–28.0)
FIO2: 21
O2 Saturation: 11.3 %
Patient temperature: 38.9
pCO2, Ven: 65.3 mmHg — ABNORMAL HIGH (ref 44.0–60.0)
pH, Ven: 7.296 (ref 7.250–7.430)
pO2, Ven: 31 mmHg — CL (ref 32.0–45.0)

## 2022-01-11 LAB — BASIC METABOLIC PANEL
Anion gap: 13 (ref 5–15)
BUN: 18 mg/dL (ref 8–23)
CO2: 22 mmol/L (ref 22–32)
Calcium: 8.4 mg/dL — ABNORMAL LOW (ref 8.9–10.3)
Chloride: 102 mmol/L (ref 98–111)
Creatinine, Ser: 0.86 mg/dL (ref 0.61–1.24)
GFR, Estimated: >60 mL/min (ref >60)
Glucose, Bld: 127 mg/dL — ABNORMAL HIGH (ref 70–99)
Potassium: 4.2 mmol/L (ref 3.5–5.1)
Sodium: 137 mmol/L (ref 135–145)

## 2022-01-11 LAB — GLUCOSE, RANDOM
Glucose, Bld: 120 mg/dL — ABNORMAL HIGH (ref 70–99)
Glucose, Bld: 120 mg/dL — ABNORMAL HIGH (ref 70–99)
Glucose, Bld: 129 mg/dL — ABNORMAL HIGH (ref 70–99)
Glucose, Bld: 136 mg/dL — ABNORMAL HIGH (ref 70–99)
Glucose, Bld: 145 mg/dL — ABNORMAL HIGH (ref 70–99)

## 2022-01-11 LAB — GLUCOSE, CAPILLARY
Glucose-Capillary: 133 mg/dL — ABNORMAL HIGH (ref 70–99)
Glucose-Capillary: 67 mg/dL — ABNORMAL LOW (ref 70–99)
Glucose-Capillary: 19 mg/dL — CL (ref 70–99)
Glucose-Capillary: 114 mg/dL — ABNORMAL HIGH (ref 70–99)

## 2022-01-11 LAB — AMMONIA: Ammonia: 15 umol/L (ref 9–35)

## 2022-01-11 LAB — MRSA NEXT GEN BY PCR, NASAL: MRSA by PCR Next Gen: NOT DETECTED

## 2022-01-11 MED ORDER — DEXTROSE 50 % IV SOLN
INTRAVENOUS | Status: AC
  Administered 2022-01-11: 50 mL
  Filled 2022-01-11: qty 50

## 2022-01-11 MED ORDER — LORAZEPAM 2 MG/ML IJ SOLN
1.0000 mg | INTRAMUSCULAR | Status: DC | PRN
  Administered 2022-01-11 – 2022-01-12 (×2): 2 mg via INTRAVENOUS
  Administered 2022-01-14: 1 mg via INTRAVENOUS
  Administered 2022-01-16 – 2022-01-18 (×2): 2 mg via INTRAVENOUS
  Filled 2022-01-11 (×6): qty 1

## 2022-01-11 MED ORDER — MAGNESIUM SULFATE 2 GM/50ML IV SOLN
2.0000 g | Freq: Once | INTRAVENOUS | Status: AC
  Administered 2022-01-11: 2 g via INTRAVENOUS
  Filled 2022-01-11: qty 50

## 2022-01-11 MED ORDER — PIPERACILLIN-TAZOBACTAM 3.375 G IVPB
3.3750 g | Freq: Three times a day (TID) | INTRAVENOUS | Status: DC
  Administered 2022-01-11 – 2022-01-15 (×12): 3.375 g via INTRAVENOUS
  Filled 2022-01-11 (×15): qty 50

## 2022-01-11 NOTE — Progress Notes (Addendum)
Subjective: Patient oriented to self and location. Off precedex. On Ativan prn for seizures. Unclear last alcohol use. Reports he has not drank for past 5 months.  ROS: Unable to obtain due to poor mental status  Examination  Vital signs in last 24 hours: Temp:  [99 F (37.2 C)-100.8 F (38.2 C)] 99 F (37.2 C) (02/06 0400) Pulse Rate:  [75-98] 81 (02/06 0600) Resp:  [15-31] 24 (02/06 0600) BP: (110-150)/(59-93) 113/59 (02/06 0600) SpO2:  [92 %-100 %] 100 % (02/06 0600) Weight:  [77.5 kg] 77.5 kg (02/06 0500)  General: lying in bed, NAD CVS: pulse-normal rate and rhythm RS: Intubated, CTAB Extremities: warm, no edema Neuro: drowsy but arousalable to voice. Opens eyes on command, able to verbalize,Oriented to name, place. Follows commands.  pupils small and difficult appreciate any reactivity, corneal reflex intact, gag reflex absent, withdraws to noxious stimuli in all extremities , brisk knee jerk   Basic Metabolic Panel: Recent Labs  Lab 01/05/22 1716 01/05/22 1716 01/05/22 1720 01/05/22 1811 01/06/22 0718 01/06/22 0950 01/06/22 1640 01/06/22 1955 01/07/22 0512 01/07/22 0808 01/07/22 1523 01/07/22 1906 01/08/22 0133 01/08/22 0724 01/09/22 0757 01/09/22 1137 01/10/22 1033 01/10/22 1523 01/10/22 2010 01/11/22 0001 01/11/22 0444  NA 135  --  138 137 133*  --   --   --  130*  --   --   --  134*  --  138  --   --   --   --   --   --   K 3.8  --  3.9 3.8 3.0*  --   --   --  3.8  --   --   --  5.0  --  4.7  --   --   --   --   --   --   CL 100  --  98  --  102  --   --   --  101  --   --   --  106  --  107  --   --   --   --   --   --   CO2 28  --   --   --  23  --   --   --  21*  --   --   --  21*  --  24  --   --   --   --   --   --   GLUCOSE 100*  --  98  --  119*  --   --    < > 98   95   < > 134*   < > 135*   128*   < > 126*   < > 124* 122* 125* 136* 129*  BUN 7*  --  7*  --  7*  --   --   --  11  --   --   --  16  --  15  --   --   --   --   --   --   CREATININE  0.95  --  0.80  --  1.04  --   --   --  0.94  --   --   --  0.99  --  0.81  --   --   --   --   --   --   CALCIUM 9.1  --   --   --  9.2  --   --   --  8.0*  --   --   --  8.1*  --  9.0  --   --   --   --   --   --   MG  --    < >  --   --  1.3* 1.4* 2.0  --  1.6*  --  2.2  --  2.0  --   --   --   --   --   --   --   --   PHOS  --    < >  --   --  2.7 3.2 3.6  --  4.4  --  4.2  --  3.4  --   --   --   --   --   --   --   --    < > = values in this interval not displayed.     CBC: Recent Labs  Lab 01/05/22 1716 01/05/22 1720 01/05/22 1811 01/06/22 0718 01/07/22 0512 01/08/22 0133  WBC 6.1  --   --  6.2 6.8 7.4  NEUTROABS 3.9  --   --   --  4.4 4.9  HGB 13.1 14.6 13.6 13.0 12.5* 12.8*  HCT 41.5 43.0 40.0 38.7* 39.2 39.4  MCV 87.9  --   --  83.2 86.0 85.8  PLT 236  --   --  193 168 145*      Coagulation Studies: No results for input(s): LABPROT, INR in the last 72 hours.  Imaging No new brain imaging overnight  ASSESSMENT AND PLAN: 62 year old male with a past medical history of heavy alcohol use, seizures and history of status epilepticus who presented with convulsive status epilepticus.   Convulsive status epilepticus (resolved) Epilepsy Acute encephalopathy,post-ictal, infectious -Status epilepticus likely due to medication noncompliance.  Per history patient was taking Keppra 1 g once daily instead of prescribed twice daily -Encephalopathy likely due to postictal state, COVID   Recommendations -Discontinued Haldol -No need for MRI at the moment.  -Continue Keppra 1000 mg twice daily -Once patient is awake, can consider switching to Keppra 2000 mg extended release once daily -Continue seizure precautions -As needed IV Ativan 2 mg for clinical seizure-like activity -Management of rest of comorbidities per primary team -Plan was discussed with critical care team -Recommend follow-up with neurology in 6 to 8 weeks after discharge  Park Pope, MD PGY1  Resident  ATTENDING ATTESTATION:  Doing better. No seizures reported. Less altered today, continue to monitor. No need for MRI or increased AEDs at the moment.   Dr. Viviann Spare evaluated pt independently, reviewed imaging, chart, labs. Discussed and formulated plan with the resident. Please see resident note above for details.       This patient is critically ill due to status epilepticus and at significant risk of neurological worsening, death form heart failure, respiratory failure, recurrent stroke, bleeding from [facility], seizure, sepsis. This patient's care requires constant monitoring of vital signs, hemodynamics, respiratory and cardiac monitoring, review of multiple databases, neurological assessment, discussion with family, other specialists and medical decision making of high complexity. I spent 35 minutes of neurocritical care time in the care of this patient.   Hensley Treat,MD

## 2022-01-11 NOTE — Progress Notes (Signed)
NAME:  Frederick [name], MRN:  [id number], DOB:  [birth year], LOS: 6 ADMISSION DATE:  01/05/2022 CONSULTATION DATE:  01/05/2022 REFERRING MD:  Silverio Lay - EDP CHIEF COMPLAINT:  Status epilepticus   History of Present Illness:  61 year old man who presented to [facility] ED 1/31 via EMS for seizures, concern for alcohol withdrawal. PMHx significant for HTN, asthma, tuberculosis (s/p treatment at [facility]), EtOH abuse, R finger osteomyelitis with strep bacteremia.  Of note, patient has seizure history with unclear medication compliance.  Per report, patient experienced several episodes of seizure activity at home.  EMS was called and patient was continuing to seize on arrival.  On arrival to ED, patient had ongoing focal left-sided seizure activity.  He was intubated for airway protection.  Keppra load was administered and EEG was obtained.  Neurology was consulted.  PCCM was consulted for further management and ICU admission.  Pertinent Medical History:   Past Medical History:  Diagnosis Date   Arthritis    Asthma    Bilateral lower extremity edema 05/28/2021   propping feet up   Cough 05/28/2021   with clear sputum   Dyspnea    Dyspnea 05/28/2021   with exertion   EtOH dependence (HCC)    drinks 2-3 of 40ounes a day   Fibula fracture    Dr Dion Saucier 02-2011   Finger osteomyelitis, right (HCC) 05/26/2021   right index finger   Hernia    surgery 01-17-13   Hypertension    not taking bp meds since may 2022   Seizures (HCC)    Streptococcal bacteremia 09/09/2021   Tuberculosis    couple of yrs ago took tx at [facility] per pt on 05-28-2021   Significant Hospital Events: Including procedures, antibiotic start and stop dates in addition to other pertinent events   1/31 - BIB EMS after seizure at home, persistently seizing in ED with focal seizure activity of L hand. Difficult to sedate, history of EtOH abuse/withdrawal. Intubated for airway protection. C/f  ongoing seizure, Neuro consult, EEG ordered. 2/1 - burst suppression pattern on EEG 2/2 - sedation weaned with no recurrence of clinical seizures.  2/3 WUA/SBT, extubated 2/6 New fever   Interim History / Subjective:  Precedex off ,  currently asleep,  New fever of 100.8, Sounds junkie, concern for airway protection  Net + 6 L  No labs this am   Objective:  Blood pressure 140/70, pulse 80, temperature 99 F (37.2 C), temperature source Oral, resp. rate 18, height 6\' 1"  (1.854 m), weight 77.5 kg, SpO2 100 %.        Intake/Output Summary (Last 24 hours) at 01/11/2022 0945 Last data filed at 01/11/2022 0600 Gross per 24 hour  Intake 1728.61 ml  Output 1575 ml  Net 153.61 ml   Filed Weights   01/09/22 0500 01/10/22 0500 01/11/22 0500  Weight: 77.5 kg 77.4 kg 77.5 kg   Physical Examination: General: Chronically ill middle aged M, lying in bed, asleep on RA HEENT: upper lip slightly swollen, No LAD, No JVD, NCAT Neuro: Asleep, Arouses to call of name but is not following commands , precedex off CV:rrr s1s2 no rgm  PULM: Bilateral chest excursion, coarse throughout, , junky, RR 21 GI: soft slightly distended, nt + bowel sounds  Extremities: no acute joint deformity no cyanosis or clubbing , brisk cap refill Skin: c/d/w no rash , no lesions   Ancillary tests personally reviewed:    Assessment & Plan:   Acute metabolic  encephalopathy  SE, improved  Hx EtOH abuse  Delirium P -Keppra, converting to oral daily 2/5 per neurology -sz precautions  -wean off precedex as able , Haldol PRN, will hold Zyprexa for now -B Vits, Multivit  -Monitor for sx withdrawal -- if concern for active withdrawal, ativan  -Swallow Eval>> Severe aspiration risk;Risk for inadequate nutrition/hydration , CorTrack and TF initiated  Acute hypoxic respiratory failure in setting of post ictal state COVID-19 infection (incidental) Hx TB  Hx asthma  P -Extubated 2/3  New Fever 2/6 Remains on RA with  sats of 98, but increased RR - CBC/ BMET now - CXR now - CBC now - Trend fever curve and WBC - Consider Blood Cx  Hypoglycemia, resolved  GERD P -PPI   Diarrhea: Following starting bowel regimen 2/3 --stop bowel regimen  Best Practice: (right click and "Reselect all SmartList Selections" daily)   Diet/type: tubefeeds   DVT prophylaxis: prophylactic heparin  GI prophylaxis: PPI Lines: N/A Foley:  N/A Code Status:  full code Last date of multidisciplinary goals of care discussion [Pending]    CRITICAL CARE N/a  Bevelyn Ngo, NP [facility] Pulmonary/Critical Care Medicine Amion for contact info  01/11/2022, 9:45 AM

## 2022-01-11 NOTE — Progress Notes (Signed)
Pharmacy Antibiotic Note  Frederick [name] is a 61 y.o. male admitted on 01/05/2022 with pneumonia.  Pharmacy has been consulted for Zosyn dosing.  Scr 0.86 (CrCl 98 ml/min)  Plan: Zosyn 3.375g IV q8h (4 hour infusion). Will monitor for acute changes in renal function and adjust as needed F/u cultures results and de-escalate as appropriate  Height: 6\' 1"  (185.4 cm) Weight: 77.5 kg (170 lb 13.7 oz) IBW/kg (Calculated) : 79.9  Temp (24hrs), Avg:99.5 F (37.5 C), Min:99 F (37.2 C), Max:99.9 F (37.7 C)  Recent Labs  Lab 01/05/22 1716 01/05/22 1720 01/06/22 0718 01/07/22 0512 01/08/22 0133 01/09/22 0757 01/11/22 0817  WBC 6.1  --  6.2 6.8 7.4  --  6.0  CREATININE 0.95   < > 1.04 0.94 0.99 0.81 0.86   < > = values in this interval not displayed.    Estimated Creatinine Clearance: 98.9 mL/min (by C-G formula based on SCr of 0.86 mg/dL).    No Known Allergies  Thank you for allowing pharmacy to be a part of this patients care.  03/11/22, PharmD Clinical Pharmacist  Please check AMION for all [facility] Pharmacy numbers After 10:00 PM, call Main Pharmacy [phone]

## 2022-01-11 NOTE — Progress Notes (Signed)
Speech Language Pathology Treatment: Dysphagia  Patient Details Name: LOCKLAN CANOY MRN: [id number] DOB: Jun 21, 1961 Today's Date: 01/11/2022 Time: 7253-6644 SLP Time Calculation (min) (ACUTE ONLY): 14 min  Assessment / Plan / Recommendation Clinical Impression  Pt was awake but drowsy and cooperative during po trials. RN cut Precedex off earlier and reports he has been more lethargic today either due to medication or acute infection and required NTS. Productive oral care provided in addition to nurse report of oral care this morning. Ice chips and thin water via spoon resulted in mildly prolonged oral transit. Suspicion of airway intrusion with immediate cough and delayed throat clear with thin. Throat clear after puree as well. Pt will likely need instrumental swallow evaluation once appropriate. Continue diligent oral care, ice chips and teaspoon sips water.    HPI HPI: Pt is a 61 year old male who presented to [facility] ED 1/31 via for seizures, concern for alcohol withdrawal. On arrival to ED, patient had ongoing focal left-sided seizure activity. He was loaded with Keppra and intubated for airway protection. ETT 1/31-2/3. EEG 2/2: severe diffuse encephalopathy. No seizures or epileptiform discharges. Pt found to have COVID-19. PMH: HTN, asthma, tuberculosis (s/p treatment at [facility]), GERD, EtOH abuse, R finger osteomyelitis with strep bacteremia. Last seen by SLP September, 2022 with recommendation for regular texture solids and thin liquids      SLP Plan  Continue with current plan of care      Recommendations for follow up therapy are one component of a multi-disciplinary discharge planning process, led by the attending physician.  Recommendations may be updated based on patient status, additional functional criteria and insurance authorization.    Recommendations  Diet recommendations: Other(comment) (ice chips, sips water after oral care) Medication Administration: Via  alternative means                Oral Care Recommendations: Oral care QID;Oral care prior to ice chip/H20 Follow Up Recommendations: Other (comment) (TBD) Plan: Continue with current plan of care           Royce Macadamia  01/11/2022, 1:55 PM  Breck Coons Lonell Face.Ed Nurse, children's [phone] Office [phone]

## 2022-01-12 ENCOUNTER — Inpatient Hospital Stay (HOSPITAL_COMMUNITY): Payer: Medicare Other

## 2022-01-12 DIAGNOSIS — G40901 Epilepsy, unspecified, not intractable, with status epilepticus: Secondary | ICD-10-CM | POA: Diagnosis not present

## 2022-01-12 DIAGNOSIS — G9341 Metabolic encephalopathy: Secondary | ICD-10-CM

## 2022-01-12 LAB — COMPREHENSIVE METABOLIC PANEL
ALT: 14 U/L (ref 0–44)
AST: 32 U/L (ref 15–41)
Albumin: 2.8 g/dL — ABNORMAL LOW (ref 3.5–5.0)
Alkaline Phosphatase: 74 U/L (ref 38–126)
Anion gap: 9 (ref 5–15)
BUN: 20 mg/dL (ref 8–23)
CO2: 29 mmol/L (ref 22–32)
Calcium: 8.9 mg/dL (ref 8.9–10.3)
Chloride: 102 mmol/L (ref 98–111)
Creatinine, Ser: 1.01 mg/dL (ref 0.61–1.24)
GFR, Estimated: >60 mL/min (ref >60)
Glucose, Bld: 130 mg/dL — ABNORMAL HIGH (ref 70–99)
Potassium: 4.2 mmol/L (ref 3.5–5.1)
Sodium: 140 mmol/L (ref 135–145)
Total Bilirubin: 0.4 mg/dL (ref 0.3–1.2)
Total Protein: 7.5 g/dL (ref 6.5–8.1)

## 2022-01-12 LAB — CBC WITH DIFFERENTIAL/PLATELET
Abs Immature Granulocytes: 0.07 10*3/uL (ref 0.00–0.07)
Basophils Absolute: 0 10*3/uL (ref 0.0–0.1)
Basophils Relative: 0 %
Eosinophils Absolute: 0.1 10*3/uL (ref 0.0–0.5)
Eosinophils Relative: 1 %
HCT: 36.5 % — ABNORMAL LOW (ref 39.0–52.0)
Hemoglobin: 12.2 g/dL — ABNORMAL LOW (ref 13.0–17.0)
Immature Granulocytes: 1 %
Lymphocytes Relative: 19 %
Lymphs Abs: 1.7 10*3/uL (ref 0.7–4.0)
MCH: 28.1 pg (ref 26.0–34.0)
MCHC: 33.4 g/dL (ref 30.0–36.0)
MCV: 84.1 fL (ref 80.0–100.0)
Monocytes Absolute: 1.3 10*3/uL — ABNORMAL HIGH (ref 0.1–1.0)
Monocytes Relative: 14 %
Neutro Abs: 6 10*3/uL (ref 1.7–7.7)
Neutrophils Relative %: 65 %
Platelets: 181 10*3/uL (ref 150–400)
RBC: 4.34 MIL/uL (ref 4.22–5.81)
RDW: 15.8 % — ABNORMAL HIGH (ref 11.5–15.5)
WBC: 9.1 10*3/uL (ref 4.0–10.5)
nRBC: 0 % (ref 0.0–0.2)

## 2022-01-12 LAB — GLUCOSE, RANDOM
Glucose, Bld: 104 mg/dL — ABNORMAL HIGH (ref 70–99)
Glucose, Bld: 112 mg/dL — ABNORMAL HIGH (ref 70–99)
Glucose, Bld: 117 mg/dL — ABNORMAL HIGH (ref 70–99)
Glucose, Bld: 121 mg/dL — ABNORMAL HIGH (ref 70–99)
Glucose, Bld: 127 mg/dL — ABNORMAL HIGH (ref 70–99)
Glucose, Bld: 128 mg/dL — ABNORMAL HIGH (ref 70–99)

## 2022-01-12 LAB — GLUCOSE, CAPILLARY
Glucose-Capillary: 11 mg/dL — CL (ref 70–99)
Glucose-Capillary: 110 mg/dL — ABNORMAL HIGH (ref 70–99)
Glucose-Capillary: 123 mg/dL — ABNORMAL HIGH (ref 70–99)

## 2022-01-12 LAB — MAGNESIUM: Magnesium: 1.9 mg/dL (ref 1.7–2.4)

## 2022-01-12 LAB — AMMONIA: Ammonia: 23 umol/L (ref 9–35)

## 2022-01-12 MED ORDER — LEVETIRACETAM 100 MG/ML PO SOLN
1000.0000 mg | Freq: Two times a day (BID) | ORAL | Status: DC
  Administered 2022-01-12 – 2022-01-22 (×22): 1000 mg
  Filled 2022-01-12 (×22): qty 10

## 2022-01-12 MED ORDER — CARVEDILOL 12.5 MG PO TABS
12.5000 mg | ORAL_TABLET | Freq: Two times a day (BID) | ORAL | Status: DC
  Administered 2022-01-12 – 2022-01-13 (×3): 12.5 mg
  Filled 2022-01-12 (×3): qty 1

## 2022-01-12 MED ORDER — HYDRALAZINE HCL 20 MG/ML IJ SOLN
5.0000 mg | Freq: Once | INTRAMUSCULAR | Status: AC | PRN
  Administered 2022-01-12: 5 mg via INTRAVENOUS
  Filled 2022-01-12: qty 1

## 2022-01-12 NOTE — Progress Notes (Addendum)
eLink Physician-Brief Progress Note Patient Name: Frederick [name] DOB: 1960/12/23 MRN: [id number]   Date of Service  01/12/2022  HPI/Events of Note  Restraints renewal. Camera eval gone. Has NG tube. Sz disorder, status.   eICU Interventions  Renewed. To prevent self injury and harm.      Intervention Category Intermediate Interventions: Other: (agitation/sz)  Elmer Sow 01/12/2022, 2:56 AM  6:35 BP elevated, 173/90.  Does not have anything available PRN  Hydralazine low dose prn ordered

## 2022-01-12 NOTE — Progress Notes (Incomplete)
Attending note: I have seen and examined the patient. History, labs and imaging reviewed.  61 Y/O with PMH of remote TB with right lung bronchiectasis, ETOH abuse, acute metabolic encephalopathy, seizures, Extubated on 2/3 Of precedex since yesterday  Blood pressure (!) 176/96, pulse (!) 118, temperature 98.5 F (36.9 C), resp. rate (!) 27, height 6\' 1"  (1.854 m), weight 77.5 kg, SpO2 97 %. Gen:      No acute distress HEENT:  EOMI, sclera anicteric Neck:     No masses; no thyromegaly Lungs:    Clear to auscultation bilaterally; normal respiratory effort CV:         Regular rate and rhythm; no murmurs Abd:      + bowel sounds; soft, non-tender; no palpable masses, no distension Ext:    No edema; adequate peripheral perfusion Skin:      Warm and dry; no rash Neuro: alert and oriented x 3 Psych: normal mood and affect   Labs/Imaging personally reviewed, significant for   Assessment/plan: Acute metabolic encephalopathy, seizures Delirium Continue Keppra Ativan PRN Keep NPO until cleared by speech Tube feeds  Acute resp failure > improved on room air Incidental COVID Zosyn for aspiration started 2/6  HTN Continue coreg  DVT prophylaxis  The patient is critically ill with multiple organ systems failure and requires high complexity decision making for assessment and support, frequent evaluation and titration of therapies, application of advanced monitoring technologies and extensive interpretation of multiple databases.  Critical care time - 35 mins. This represents my time independent of the NPs time taking care of the pt.  MD [place] [facility] 01/12/2022, 10:04 AM

## 2022-01-12 NOTE — Progress Notes (Signed)
Occupational Therapy Treatment Patient Details Name: Frederick [name] MRN: [id number] DOB: Jun 29, 1961 Today's Date: 01/12/2022   History of present illness 61 year old man who presented to [facility] ED 1/31 via EMS for seizures, concern for alcohol withdrawal. COVID +; Intubated 01/05/22 for airway protection due to seizures; Cortak placed 01/08/22; extubated 01/08/22; agitation with ?alcohol withdrawal  PMHx significant for HTN, asthma, tuberculosis), EtOH abuse, R finger osteomyelitis with strep bacteremia, seizures with ?med compliance.   OT comments  Pt more alert than on initial evaluation but continues to require extensive +2 physical assist for safe bed mobility due to significantly impaired cognition and strength. Pt with heavy L lateral lean sitting EOB, unable to follow commands to correct/participate effectively this AM and ultimately, unable to safely attempt OOB activities. Continue to rec SNF rehab at DC.   Recommendations for follow up therapy are one component of a multi-disciplinary discharge planning process, led by the attending physician.  Recommendations may be updated based on patient status, additional functional criteria and insurance authorization.    Follow Up Recommendations  Skilled nursing-short term rehab (<3 hours/day)    Assistance Recommended at Discharge Frequent or constant Supervision/Assistance  Patient can return home with the following  A lot of help with bathing/dressing/bathroom;Assistance with cooking/housework;Direct supervision/assist for medications management;Direct supervision/assist for financial management;Assist for transportation;Help with stairs or ramp for entrance;Two people to help with walking and/or transfers   Equipment Recommendations  Other (comment) (TBD pending progress)    Recommendations for Other Services      Precautions / Restrictions Precautions Precautions: Fall Precaution Comments: Bil wrist restraints and mittens,  cortrak Restrictions Weight Bearing Restrictions: No       Mobility Bed Mobility Overal bed mobility: Needs Assistance Bed Mobility: Rolling, Supine to Sit, Sit to Supine Rolling: Total assist, +2 for physical assistance   Supine to sit: +2 for physical assistance, Total assist Sit to supine: +2 for physical assistance, Total assist   General bed mobility comments: Assist for all aspects    Transfers                   General transfer comment: Unable to safely attempt due to very poor sitting balance and very poor cognition     Balance Overall balance assessment: Needs assistance Sitting-balance support: Feet supported, Bilateral upper extremity supported Sitting balance-Leahy Scale: Zero Sitting balance - Comments: Pt sat EOB x 10 minutes with mod to total assist with heavy lean/push to the lt Postural control: Left lateral lean                                 ADL either performed or assessed with clinical judgement   ADL Overall ADL's : Needs assistance/impaired     Grooming: Total assistance;Sitting;Wash/dry face Grooming Details (indicate cue type and reason): drooling, demo some insight to reach for washcloth after OT wiped pt mouth but unable to sustain             Lower Body Dressing: Total assistance;Bed level Lower Body Dressing Details (indicate cue type and reason): to don socks, difficulty following commands to lift LEs to assist with task               General ADL Comments: total A; NPO    Extremity/Trunk Assessment Upper Extremity Assessment Upper Extremity Assessment: Difficult to assess due to impaired cognition;Generalized weakness   Lower Extremity Assessment Lower Extremity Assessment: Defer to PT evaluation  Vision   Vision Assessment?: No apparent visual deficits   Perception     Praxis      Cognition Arousal/Alertness: Awake/alert Behavior During Therapy: Flat affect Overall Cognitive Status:  Impaired/Different from baseline Area of Impairment: Attention, Following commands, Orientation                 Orientation Level: Time, Situation (Pt able to state [place] when asked what city he was in, able to report hospital but difficulty reporting why; unable to report month) Current Attention Level: Focused   Following Commands: Follows one step commands inconsistently       General Comments: speech very garbled;mumbles; demo some appropriate responses. follows <10% of commands        Exercises      Shoulder Instructions       General Comments VSS on RA    Pertinent Vitals/ Pain       Pain Assessment Pain Assessment: Faces Faces Pain Scale: No hurt Pain Intervention(s): Monitored during session  Home Living                                          Prior Functioning/Environment              Frequency  Min 2X/week        Progress Toward Goals  OT Goals(current goals can now be found in the care plan section)  Progress towards OT goals: Progressing toward goals  Acute Rehab OT Goals Patient Stated Goal: none stated OT Goal Formulation: With family Time For Goal Achievement: 01/24/22 Potential to Achieve Goals: Fair ADL Goals Pt Will Perform Grooming: with mod assist;bed level Additional ADL Goal #1: Pt will follow 50% of 1 step commands to increase activity participation for ADL tasks  Plan Discharge plan remains appropriate    Co-evaluation      Reason for Co-Treatment: Complexity of the patient's impairments (multi-system involvement);Necessary to address cognition/behavior during functional activity;For patient/therapist safety;To address functional/ADL transfers          AM-PAC OT "6 Clicks" Daily Activity     Outcome Measure   Help from another person eating meals?: Total Help from another person taking care of personal grooming?: Total Help from another person toileting, which includes using toliet,  bedpan, or urinal?: Total Help from another person bathing (including washing, rinsing, drying)?: Total Help from another person to put on and taking off regular upper body clothing?: Total Help from another person to put on and taking off regular lower body clothing?: Total 6 Click Score: 6    End of Session    OT Visit Diagnosis: Other abnormalities of gait and mobility (R26.89);Muscle weakness (generalized) (M62.81);Other symptoms and signs involving cognitive function   Activity Tolerance Other (comment) (limited by cognition)   Patient Left in bed;with call bell/phone within reach;with bed alarm set;with restraints reapplied (with mittens reapplied)   Nurse Communication Mobility status        Time: 9357-0177 OT Time Calculation (min): 26 min  Charges: OT General Charges $OT Visit: 1 Visit OT Treatments $Therapeutic Activity: 8-22 mins  Bradd Canary, OTR/L Acute Rehab Services Office: [phone]   Lorre Munroe 01/12/2022, 11:29 AM

## 2022-01-12 NOTE — TOC Initial Note (Signed)
Transition of Care [facility]) - Initial/Assessment Note    Patient Details  Name: Frederick [name] MRN: [id number] Date of Birth: 06-23-1961  [facility] CM/SW Contact:    Milinda Antis, [place] Phone Number: 01/12/2022, 3:49 PM  Clinical Narrative:                 CSW reviewed chart and observed that PT and OT recommended SNF.  CSW contacted the patient's significant other, Barrington Ellison, due to the patient being disoriented.  The significant other reports that she transports the patient to appointments and that both she and the patient were aware that he would need to go to a SNF at discharge.  The significant other does not know if the patient has received any COVID 19 vaccines.  The significant other did not express a preference, but stated that the patient would not want to go back to "[facility]" (now known as [facility]).    SNF workup to be complete when patient is closer to being medically stable.    Expected Discharge Plan: Skilled Nursing Facility Barriers to Discharge: Continued Medical Work up, SNF Pending bed offer, Insurance Authorization   Patient Goals and CMS Choice   CMS Medicare.gov Compare Post Acute Care list provided to:: Patient Represenative (must comment) Choice offered to / list presented to :  (significant other)  Expected Discharge Plan and Services Expected Discharge Plan: [place] arrangements for the past 2 months: Apartment                                      Prior Living Arrangements/Services Living arrangements for the past 2 months: Apartment                     Activities of Daily Living Home Assistive Devices/Equipment: None ADL Screening (condition at time of admission) Patient's cognitive ability adequate to safely complete daily activities?: No Is the patient deaf or have difficulty hearing?: No Does the patient have difficulty seeing, even when wearing glasses/contacts?: No Does  the patient have difficulty concentrating, remembering, or making decisions?: No Patient able to express need for assistance with ADLs?: No Does the patient have difficulty dressing or bathing?: Yes Independently performs ADLs?: No Communication: Dependent Is this a change from baseline?: Change from baseline, expected to last >3 days Dressing (OT): Dependent Is this a change from baseline?: Change from baseline, expected to last >3 days Grooming: Dependent Is this a change from baseline?: Change from baseline, expected to last >3 days Feeding: Dependent Is this a change from baseline?: Change from baseline, expected to last >3 days Bathing: Dependent Is this a change from baseline?: Change from baseline, expected to last >3 days Toileting: Dependent Is this a change from baseline?: Change from baseline, expected to last >3days In/Out Bed: Dependent Is this a change from baseline?: Change from baseline, expected to last >3 days Walks in Home: Dependent Is this a change from baseline?: Change from baseline, expected to last >3 days Does the patient have difficulty walking or climbing stairs?: Yes Weakness of Legs: Both Weakness of Arms/Hands: Both  Permission Sought/Granted                  Emotional Assessment              Admission diagnosis:  Status epilepticus ([place]) [G40.901] Patient Active  Problem List   Diagnosis Date Noted   Protein-calorie malnutrition, severe 01/07/2022   Streptococcal bacteremia 09/09/2021   Alcohol withdrawal seizure with complication, with unspecified complication ([place]) [id number]   CKD (chronic kidney disease), stage III ([place]) 08/19/2021   Dysphagia 08/19/2021   Malnutrition of moderate degree 08/12/2021   Bacteremia due to group B Streptococcus    Acute systolic CHF (congestive heart failure) ([place])    Seizures ([place]) 08/10/2021   Status epilepticus ([place])    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    Osteomyelitis of index finger of right hand ([place]) 05/28/2021   Marijuana abuse 07/22/2017   Hyponatremia 12/03/2015   Left rib fracture 11/29/2015   Microcytic anemia 11/29/2015   Cigarette smoker 11/29/2015   Moderate protein-calorie malnutrition ([place]) 11/29/2015   Unintentional weight loss 11/29/2015   History of tuberculosis 11/28/2015   Cavitary lesion of lung 11/28/2015   SBO (small bowel obstruction) ([place]) 03/10/2013   Abdominal pain, diffuse 03/10/2013   Nausea and vomiting 03/10/2013   Fracture of fibula, proximal 01/21/2013   Closed fracture of left distal distal tibia 01/21/2013   EtOH dependence ([place])    Fibula fracture    Hernia    PCP:  Pa, [facility] Pharmacy:   [id number] [address], [place] [id number] [place] [id number] Phone: [phone] Fax: [phone]  [facility] [address] [place] [address] Phone: [phone] Fax: [phone]     Social Determinants of Health (SDOH) Interventions    Readmission Risk Interventions Readmission Risk Prevention Plan 01/12/2022  Transportation Screening Complete  Medication Review (RN Care Manager) Referral to Pharmacy  PCP or Specialist appointment within 3-5 days of discharge Not Complete  PCP/Specialist Appt Not Complete comments Patient not ready for d/c  HRI or [place] Not Complete  SW Recovery Care/Counseling Consult Complete  Palliative Care Screening Not Applicable  Skilled Nursing Facility Complete  Some recent data might be hidden

## 2022-01-12 NOTE — Progress Notes (Signed)
Physical Therapy Treatment Patient Details Name: Frederick [name] MRN: [id number] DOB: [birth year] Today's Date: 01/12/2022   History of Present Illness 61 year old man who presented to [facility] ED 1/31 via EMS for seizures, concern for alcohol withdrawal. COVID +; Intubated 01/05/22 for airway protection due to seizures; Cortak placed 01/08/22; extubated 01/08/22; agitation with ?alcohol withdrawal  PMHx significant for HTN, asthma, tuberculosis), EtOH abuse, R finger osteomyelitis with strep bacteremia, seizures with ?med compliance.    PT Comments    Pt much more awake and alert than on eval but remains very impaired with mobility due to very poor cognition, weakness, and very poor balance. Recommend SNF at DC.    Recommendations for follow up therapy are one component of a multi-disciplinary discharge planning process, led by the attending physician.  Recommendations may be updated based on patient status, additional functional criteria and insurance authorization.  Follow Up Recommendations  Skilled nursing-short term rehab (<3 hours/day)     Assistance Recommended at Discharge Frequent or constant Supervision/Assistance  Patient can return home with the following Two people to help with walking and/or transfers   Equipment Recommendations  Hospital bed;Other (comment) (hoyer lift)    Recommendations for Other Services       Precautions / Restrictions Precautions Precautions: Fall Precaution Comments: Bil wrist restraints and mittens     Mobility  Bed Mobility Overal bed mobility: Needs Assistance Bed Mobility: Rolling, Supine to Sit, Sit to Supine Rolling: Total assist, +2 for physical assistance   Supine to sit: +2 for physical assistance, Total assist Sit to supine: +2 for physical assistance, Total assist   General bed mobility comments: Assist for all aspects    Transfers                   General transfer comment: Unable to safely attempt due to very poor sitting  balance and very poor cognition    Ambulation/Gait                   Stairs             Wheelchair Mobility    Modified Rankin (Stroke Patients Only)       Balance Overall balance assessment: Needs assistance Sitting-balance support: Feet supported, Bilateral upper extremity supported Sitting balance-Leahy Scale: Zero Sitting balance - Comments: Pt sat EOB x 10 minutes with mod to total assist with heavy lean/push to the lt Postural control: Left lateral lean                                  Cognition Arousal/Alertness: Awake/alert Behavior During Therapy: Flat affect Overall Cognitive Status: Impaired/Different from baseline Area of Impairment: Attention, Following commands, Orientation                 Orientation Level:  (Pt able to state [place] when asked what city he was in) Current Attention Level: Focused   Following Commands:  (not following commands)       General Comments: speech very garbled;mumbles.        Exercises      General Comments General comments (skin integrity, edema, etc.): VSS on RA      Pertinent Vitals/Pain Pain Assessment Pain Assessment: Faces Faces Pain Scale: No hurt    Home Living                          Prior Function  PT Goals (current goals can now be found in the care plan section) Progress towards PT goals: Progressing toward goals    Frequency    Min 2X/week      PT Plan Discharge plan needs to be updated    Co-evaluation PT/OT/SLP Co-Evaluation/Treatment: Yes Reason for Co-Treatment: Complexity of the patient's impairments (multi-system involvement);Necessary to address cognition/behavior during functional activity;For patient/therapist safety          AM-PAC PT "6 Clicks" Mobility   Outcome Measure  Help needed turning from your back to your side while in a flat bed without using bedrails?: Total Help needed moving from lying on your  back to sitting on the side of a flat bed without using bedrails?: Total Help needed moving to and from a bed to a chair (including a wheelchair)?: Total Help needed standing up from a chair using your arms (e.g., wheelchair or bedside chair)?: Total Help needed to walk in hospital room?: Total Help needed climbing 3-5 steps with a railing? : Total 6 Click Score: 6    End of Session   Activity Tolerance: Other (comment) (Limited by poor cognition) Patient left: in bed;with call bell/phone within reach;with bed alarm set;with restraints reapplied Nurse Communication: Mobility status PT Visit Diagnosis: Muscle weakness (generalized) (M62.81);Difficulty in walking, not elsewhere classified (R26.2)     Time: 9518-8416 PT Time Calculation (min) (ACUTE ONLY): 25 min  Charges:  $Therapeutic Activity: 8-22 mins                     [facility] PT Acute Rehabilitation Services Pager [phone] Office [phone]    Angelina Ok [facility] 01/12/2022, 10:43 AM

## 2022-01-12 NOTE — Progress Notes (Signed)
NAME:  Frederick [name], MRN:  [id number], DOB:  [birth year], LOS: 7 ADMISSION DATE:  01/05/2022 CONSULTATION DATE:  01/05/2022 REFERRING MD:  Darl Householder - EDP CHIEF COMPLAINT:  Status epilepticus   History of Present Illness:  61 year old man who presented to [facility] ED 1/31 via EMS for seizures, concern for alcohol withdrawal. PMHx significant for HTN, asthma, tuberculosis (s/p treatment at [facility]), EtOH abuse, R finger osteomyelitis with strep bacteremia.  Of note, patient has seizure history with unclear medication compliance.  Per report, patient experienced several episodes of seizure activity at home.  EMS was called and patient was continuing to seize on arrival.  On arrival to ED, patient had ongoing focal left-sided seizure activity.  He was intubated for airway protection.  Keppra load was administered and EEG was obtained.  Neurology was consulted.  PCCM was consulted for further management and ICU admission.  Pertinent Medical History:   Past Medical History:  Diagnosis Date   Arthritis    Asthma    Bilateral lower extremity edema 05/28/2021   propping feet up   Cough 05/28/2021   with clear sputum   Dyspnea    Dyspnea 05/28/2021   with exertion   EtOH dependence (HCC)    drinks 2-3 of 40ounes a day   Fibula fracture    Dr Mardelle Matte 02-2011   Finger osteomyelitis, right ([place]) 05/26/2021   right index finger   Hernia    surgery 01-17-13   Hypertension    not taking bp meds since may 2022   Seizures ([place])    Streptococcal bacteremia 09/09/2021   Tuberculosis    couple of yrs ago took tx at [place] per pt on 05-28-2021   Significant Hospital Events: Including procedures, antibiotic start and stop dates in addition to other pertinent events   1/31 - BIB EMS after seizure at home, persistently seizing in ED with focal seizure activity of L hand. Difficult to sedate, history of EtOH abuse/withdrawal. Intubated for airway protection. C/f  ongoing seizure, Neuro consult, EEG ordered. 2/1 - Burst suppression pattern on EEG 2/2 - Sedation weaned with no recurrence of clinical seizures.  2/3 - WUA/SBT, extubated 2/6 - New fever. Precedex weaned off.   Interim History / Subjective:   Maximum temperature overnight of 100.2. Patient remains tachycardic with hypertension. CXR obtained this AM that is unchanged.   This AM, patient is awake and alert. He is able to state his name but other disoriented. He denies any discomfort.   Objective:  Blood pressure (!) 173/90, pulse (!) 116, temperature 98.3 F (36.8 C), temperature source Oral, resp. rate (!) 26, height 6\' 1"  (1.854 m), weight 77.5 kg, SpO2 94 %.        Intake/Output Summary (Last 24 hours) at 01/12/2022 0736 Last data filed at 01/12/2022 0600 Gross per 24 hour  Intake 1842.14 ml  Output 1750 ml  Net 92.14 ml    Filed Weights   01/09/22 0500 01/10/22 0500 01/11/22 0500  Weight: 77.5 kg 77.4 kg 77.5 kg   Physical Examination:  General: Acute on chronically ill middle aged M HEENT: Dry mucus membranes  Neuro: Alert and oriented to person only.  CV: Regular rhythm with tachycardia. No murmurs  PULM: Rhonchi in the bibasilar regions, no wheezing. No increased work of breathing but mildly tachypneic GI: soft non-distended, + bowel sounds  Extremities: no acute joint deformity no cyanosis or clubbing , brisk cap refill Skin: c/d/w no rash , no lesions  Ancillary tests personally reviewed:  Diarrhea  Hypoglycemia   Assessment & Plan:   Status Epilepticus  Acute metabolic encephalopathy  History of Alcohol Abuse  Delirium No additional seizure activity noted, however patient continues to be altered. Seems waxing and waning consistent with delirium.   - Continue Keppra 1000 mg BID  - Ativan PRN for seizure activity  - Seizure precautions  - Delirium precautions   Acute hypoxic respiratory failure 2/2 post ictal state COVID-19 infection  (incidental) History of TB s/p treatment (2016)  History of Asthma  Resolved. No longer requiring supplemental oxygen.  Febrile  CXR obtained and unchanged to prior. Given increased respiratory rate, Zosyn started for potential aspiration pneumonia. Patient has severe dysphagia with multiple aspiration pneumonia incidences in the past. Expectorated sputum results pending. Blood cultures pending.   - Monitor WBC - Follow up culture results - Continue Zosyn, Day 2   Sinus Tachycardia HR gradually increased since the evening of 2/6. Will increased beta-blocker  - Increase Coreg to 12.5 mg BID  Hypertension  Blood pressure increased over the last 24 hours. Only BP medication at this time is low-dose Coreg.   - Increase Coreg to 12.5 mg BID  Dysphagia  - Continue SLP evaluation and treatment - NPO  - Nutrition via Tube Feeds   HFrEF  TEE in September 2022 with TEE demonstrating EF of 45%.   History of DVT  On Apixaban prior to arrival. Complated over 3 months of treatment. No indication for continued anti-coagulation  - DVT ppx only   GERD - PPI   Best Practice: (right click and "Reselect all SmartList Selections" daily)   Diet/type: tubefeeds   DVT prophylaxis: prophylactic heparin  GI prophylaxis: PPI Lines: N/A Foley:  N/A Code Status:  full code Last date of multidisciplinary goals of care discussion [Pending]   Dr. Jose Persia Internal Medicine PGY-3  01/12/2022, 7:43 AM

## 2022-01-12 NOTE — Progress Notes (Signed)
Infection Prevention  Received call from: Fleet Contras RN Unit:42M  Regarding: Isolation status of Pt Frederick [name] IP Recommendation: Upon chart review and discussion with Lab re: pt's +Covid test on 01/05/22, guidance was given that patient should stay on Airborne/Contact precautions for at least 10 days (2/1-2/10) Reevaluation by the Provider team can occur at that time. If pt's ongoing issues do not appear Covid related the Provider team may discontinue Airborne/Contact precautions. Further Covid symptoms will warrant 21 day Airborne/Contact isolation. IP recommends that pt Provider team contact Infectious Disease Provider for clinical decision making assistance if needed.

## 2022-01-12 NOTE — Progress Notes (Signed)
Nutrition Follow-up  DOCUMENTATION CODES:   Severe malnutrition in context of chronic illness  INTERVENTION:   Continue tube feeds via Cortrak: - Vital 1.5 @ 60 ml/hr (1440 ml/day) - ProSource TF 45 ml BID  Tube feeding regimen provides 2240 kcal, 119 grams of protein, and 1094 ml of H2O.  Total carbohydrate/24 hours: 269 grams  NUTRITION DIAGNOSIS:   Severe Malnutrition related to chronic illness (TB, ETOH abuse) as evidenced by severe muscle depletion, severe fat depletion.  Ongoing, being addressed via TF  GOAL:   Patient will meet greater than or equal to 90% of their needs  Met via TF  MONITOR:   TF tolerance  REASON FOR ASSESSMENT:   Consult Enteral/tube feeding initiation and management  ASSESSMENT:   Pt with PMH of HTN, asthma, TB, ETOH abuse, R finger osteomyelitis with strep bacteremia admitted with status epilepticus and acute encephalopathy.  02/01 - TF started 02/02 - hypoglycemia continues  02/03 - Cortrak placed (tip gastric), extubated  Will discuss pt with RN and during ICU rounds. SLP recommending NPO. Tube feeds infusing at goal rate via Cortrak. No tolerance issues noted. Pt with rectal tube in place for type 7 stools. Will continue with current tube feeding regimen at this time.  Pt with seemingly random episodes of hypoglycemia down to 11 at 0412 this AM. Recommend adjusting insulin regimen. Discussed with Pharmacy and some of these lows were inaccurate. Noted SSI q 4 hours orders have been discontinued.  Admit weight: 77.1 kg Current weight: 77.5 kg  Pt with mild pitting edema to BLE.  Current TF: Vital 1.5 @ 60 ml/hr, ProSource TF 45 ml BID  Medications reviewed and include: folic acid, MVI with minerals, thiamine, IV abx  Labs reviewed. CBG's: 11-114 x 24 hours  UOP: 1250 ml x 24 hours Rectal tube: 500 ml x 24 hours I/O's: +6.2 L since admit  Diet Order:   Diet Order             Diet NPO time specified  Diet effective now                    EDUCATION NEEDS:   Not appropriate for education at this time  Skin:  Skin Assessment: Reviewed RN Assessment  Last BM:  01/12/22 rectal tube  Height:   Ht Readings from Last 1 Encounters:  01/05/22 _0  (1.854 m)    Weight:   Wt Readings from Last 1 Encounters:  01/11/22 77.5 kg    BMI:  Body mass index is 22.54 kg/m.  Estimated Nutritional Needs:   Kcal:  2100-2300  Protein:  115-130 grams  Fluid:  > 2 L/day    Gustavus Bryant, MS, RD, LDN Inpatient Clinical Dietitian Please see AMiON for contact information.

## 2022-01-13 DIAGNOSIS — F1029 Alcohol dependence with unspecified alcohol-induced disorder: Secondary | ICD-10-CM

## 2022-01-13 LAB — CULTURE, RESPIRATORY W GRAM STAIN

## 2022-01-13 LAB — GLUCOSE, RANDOM
Glucose, Bld: 139 mg/dL — ABNORMAL HIGH (ref 70–99)
Glucose, Bld: 130 mg/dL — ABNORMAL HIGH (ref 70–99)
Glucose, Bld: 140 mg/dL — ABNORMAL HIGH (ref 70–99)
Glucose, Bld: 130 mg/dL — ABNORMAL HIGH (ref 70–99)

## 2022-01-13 MED ORDER — QUETIAPINE FUMARATE 25 MG PO TABS
25.0000 mg | ORAL_TABLET | Freq: Every day | ORAL | Status: DC
  Administered 2022-01-13 – 2022-01-17 (×5): 25 mg via ORAL
  Filled 2022-01-13 (×5): qty 1

## 2022-01-13 MED ORDER — CARVEDILOL 25 MG PO TABS
25.0000 mg | ORAL_TABLET | Freq: Two times a day (BID) | ORAL | Status: DC
  Administered 2022-01-13 – 2022-01-22 (×19): 25 mg
  Filled 2022-01-13 (×19): qty 1

## 2022-01-13 NOTE — Plan of Care (Signed)
  Problem: Safety: Goal: Non-violent Restraint(s) Outcome: Progressing   

## 2022-01-13 NOTE — Progress Notes (Signed)
°   01/13/22 0447  Assess: MEWS Score  Temp 98 F (36.7 C)  BP (!) 176/96  Pulse Rate (!) 113  ECG Heart Rate (!) 115  Resp 20  SpO2 100 %  Assess: MEWS Score  MEWS Temp 0  MEWS Systolic 0  MEWS Pulse 2  MEWS RR 0  MEWS LOC 0  MEWS Score 2  MEWS Score Color Yellow  Assess: if the MEWS score is Yellow or Red  Were vital signs taken at a resting state? Yes  Focused Assessment No change from prior assessment  Early Detection of Sepsis Score *See Row Information* Low  MEWS guidelines implemented *See Row Information* Yes  Treat  MEWS Interventions Escalated (See documentation below)  Escalate  MEWS: Escalate Yellow: discuss with charge nurse/RN and consider discussing with provider and RRT  Notify: Charge Nurse/RN  Name of Charge Nurse/RN Notified Liam Rogers, RN  Date Charge Nurse/RN Notified 01/13/22  Time Charge Nurse/RN Notified [phone]

## 2022-01-13 NOTE — Progress Notes (Signed)
PROGRESS NOTE    Frederick [name]  PFX:[id number] DOB: 04-27-1961 DOA: 01/05/2022 PCP: [name]    Chief Complaint  Patient presents with   Seizures    Brief Narrative:  61 year old man who presented to [facility] ED 1/31 via EMS for seizures, concern for alcohol withdrawal. PMHx significant for HTN, asthma, tuberculosis (s/p treatment at [facility]), EtOH abuse, R finger osteomyelitis with strep bacteremia.  Of note, patient has seizure history with unclear medication compliance.   Per report, patient experienced several episodes of seizure activity at home.  EMS was called and patient was continuing to seize on arrival.  On arrival to ED, patient had ongoing focal left-sided seizure activity.  He was intubated for airway protection.  Keppra load was administered and EEG was obtained.  Neurology was consulted.  1/31 - BIB EMS after seizure at home, persistently seizing in ED with focal seizure activity of L hand. Difficult to sedate, history of EtOH abuse/withdrawal. Intubated for airway protection. C/f ongoing seizure, Neuro consult, EEG ordered. 2/1 - Burst suppression pattern on EEG 2/2 - Sedation weaned with no recurrence of clinical seizures.  2/3 - WUA/SBT, extubated 2/6 - New fever. Precedex weaned off.  Assessment & Plan:   Principal Problem:   Status epilepticus ([place]) Active Problems:   EtOH dependence ([place])   Cavitary lesion of lung   CKD (chronic kidney disease), stage III (HCC)   Protein-calorie malnutrition, severe   Acute metabolic encephalopathy    Status Epilepticus  Acute metabolic encephalopathy  History of Alcohol Abuse  Delirium -Seizure management per neurology, patient currently on Keppra 1000 mg p.o. twice daily, and as needed Ativan as well. -Precautions as well. -He is on CIWA protocol. -He remains with significant hospital delirium, will start on Seroquel via tube . -Continue with delirium precautions.    Acute hypoxic  respiratory failure 2/2 post ictal state COVID-19 infection (incidental) History of TB s/p treatment (2016)  History of Asthma  -Remains on 4 L nasal cannula. -Remains altered, but will encourage incentive spirometer and flutter valve when he is more awake and appropriate.   Febrile  CXR obtained and unchanged to prior. Given increased respiratory rate, Zosyn started for potential aspiration pneumonia. Patient has severe dysphagia with multiple aspiration pneumonia incidences in the past. Expectorated sputum results pending. Blood cultures pending.  -Continue with Zosyn  Sinus Tachycardia HR gradually increased since the evening of 2/6.  -Blood pressure is elevated, as well heart rate is elevated, will increase Coreg .   Hypertension  Blood pressure increased over the last 24 hours. Only BP medication at this time is low-dose Coreg.   - Increase Coreg to 25 mg BID   Dysphagia  - Continue SLP evaluation and treatment - NPO  - Nutrition via Tube Feeds    HFrEF  TEE in September 2022 with TEE demonstrating EF of 45%.    History of DVT  On Apixaban prior to arrival. Complated over 3 months of treatment. No indication for continued anti-coagulation   - DVT ppx only    GERD - PPI   DVT prophylaxis: Heparin Code Status: Full Family Communication: None at bedside Disposition:   Status is: Inpatient Remains inpatient appropriate because: npo, on IV ABX and tube feed           Consultants:  PCCM Neurology   Subjective:  Since on bilateral mittens and wrist restraint this morning, have tried to discontinue his right wrist restraint, but he ended up pulling his  IV access, he remains confused, unable to provide unreliable complaints, but no significant events beside confusion and restlessness as discussed with staff.  Objective: Vitals:   01/13/22 0500 01/13/22 0635 01/13/22 0800 01/13/22 1200  BP:  (!) 158/89 (!) 150/114 136/89  Pulse:  (!) 118 (!) 118 (!) 109   Resp:  '20 19 18  ' Temp:  98 F (36.7 C)  98.1 F (36.7 C)  TempSrc:  Oral  Oral  SpO2:  99%  100%  Weight: 77 kg     Height:        Intake/Output Summary (Last 24 hours) at 01/13/2022 1448 Last data filed at 01/13/2022 0448 Gross per 24 hour  Intake 734.6 ml  Output 600 ml  Net 134.6 ml   Filed Weights   01/10/22 0500 01/11/22 0500 01/13/22 0500  Weight: 77.4 kg 77.5 kg 77 kg    Examination:  Awake , open eyes, mumbles some words, sometimes conversant answering some questions, but he remains with impaired cognition and insight . Symmetrical Chest wall movement, Good air movement bilaterally, CTAB Tachycardic, No Gallops,Rubs or new Murmurs, No Parasternal Heave +ve B.Sounds, Abd Soft, No tenderness, No rebound - guarding or rigidity. No Cyanosis, Clubbing or edema, No new Rash or bruise       Data Reviewed: I have personally reviewed following labs and imaging studies  CBC: Recent Labs  Lab 01/07/22 0512 01/08/22 0133 01/11/22 0817 01/12/22 0513  WBC 6.8 7.4 6.0 9.1  NEUTROABS 4.4 4.9 3.6 6.0  HGB 12.5* 12.8* 11.6* 12.2*  HCT 39.2 39.4 36.8* 36.5*  MCV 86.0 85.8 85.4 84.1  PLT 168 145* 167 010    Basic Metabolic Panel: Recent Labs  Lab 01/06/22 1640 01/06/22 1955 01/07/22 0512 01/07/22 0808 01/07/22 1523 01/07/22 1906 01/08/22 0133 01/08/22 0724 01/09/22 0757 01/09/22 1137 01/11/22 0817 01/11/22 1311 01/12/22 0513 01/12/22 1122 01/12/22 1522 01/12/22 2217 01/13/22 0158 01/13/22 0609 01/13/22 1008  NA  --   --  130*  --   --   --  134*  --  138  --  137  --  140  --   --   --   --   --   --   K  --   --  3.8  --   --   --  5.0  --  4.7  --  4.2  --  4.2  --   --   --   --   --   --   CL  --   --  101  --   --   --  106  --  107  --  102  --  102  --   --   --   --   --   --   CO2  --   --  21*  --   --   --  21*  --  24  --  22  --  29  --   --   --   --   --   --   GLUCOSE  --    < > 98   95   < > 134*   < > 135*   128*   < > 126*   < > 127*    145*   < > 130*   128*   < > 121* 127* 139* 130* 140*  BUN  --   --  11  --   --   --  16  --  15  --  18  --  20  --   --   --   --   --   --   CREATININE  --   --  0.94  --   --   --  0.99  --  0.81  --  0.86  --  1.01  --   --   --   --   --   --   CALCIUM  --   --  8.0*  --   --   --  8.1*  --  9.0  --  8.4*  --  8.9  --   --   --   --   --   --   MG 2.0  --  1.6*  --  2.2  --  2.0  --   --   --  1.6*  --  1.9  --   --   --   --   --   --   PHOS 3.6  --  4.4  --  4.2  --  3.4  --   --   --   --   --   --   --   --   --   --   --   --    < > = values in this interval not displayed.    GFR: Estimated Creatinine Clearance: 83.6 mL/min (by C-G formula based on SCr of 1.01 mg/dL).  Liver Function Tests: Recent Labs  Lab 01/07/22 0512 01/08/22 0133 01/09/22 0757 01/12/22 0513  AST 17 18 32 32  ALT '8 7 11 14  ' ALKPHOS 88 91 92 74  BILITOT 0.5 0.4 0.2* 0.4  PROT 6.6 6.3* 6.7 7.5  ALBUMIN 2.5* 2.3* 2.4* 2.8*    CBG: Recent Labs  Lab 01/11/22 1833 01/11/22 2047 01/12/22 0042 01/12/22 0412 01/12/22 0512  GLUCAP 19* 114* 110* 11* 123*     Recent Results (from the past 240 hour(s))  Resp Panel by RT-PCR (Flu A&B, Covid) Nasopharyngeal Swab     Status: Abnormal   Collection Time: 01/05/22  5:17 PM   Specimen: Nasopharyngeal Swab; Nasopharyngeal(NP) swabs in vial transport medium  Result Value Ref Range Status   SARS Coronavirus 2 by RT PCR POSITIVE (A) NEGATIVE Final    Comment: (NOTE) SARS-CoV-2 target nucleic acids are DETECTED.  The SARS-CoV-2 RNA is generally detectable in upper respiratory specimens during the acute phase of infection. Positive results are indicative of the presence of the identified virus, but do not rule out bacterial infection or co-infection with other pathogens not detected by the test. Clinical correlation with patient history and other diagnostic information is necessary to determine patient infection status. The expected result is  Negative.  Fact Sheet for Patients: EntrepreneurPulse.com.au  Fact Sheet for Healthcare Providers: IncredibleEmployment.be  This test is not yet approved or cleared by the [place] FDA and  has been authorized for detection and/or diagnosis of SARS-CoV-2 by FDA under an Emergency Use Authorization (EUA).  This EUA will remain in effect (meaning this test can be used) for the duration of  the COVID-19 declaration under Section 564(b)(1) of the A ct, 21 U.S.C. section 360bbb-3(b)(1), unless the authorization is terminated or revoked sooner.     Influenza A by PCR NEGATIVE NEGATIVE Final   Influenza B by PCR NEGATIVE NEGATIVE Final    Comment: (NOTE) The Xpert Xpress SARS-CoV-2/FLU/RSV plus assay is intended as an aid in the diagnosis  of influenza from Nasopharyngeal swab specimens and should not be used as a sole basis for treatment. Nasal washings and aspirates are unacceptable for Xpert Xpress SARS-CoV-2/FLU/RSV testing.  Fact Sheet for Patients: EntrepreneurPulse.com.au  Fact Sheet for Healthcare Providers: IncredibleEmployment.be  This test is not yet approved or cleared by the [place] FDA and has been authorized for detection and/or diagnosis of SARS-CoV-2 by FDA under an Emergency Use Authorization (EUA). This EUA will remain in effect (meaning this test can be used) for the duration of the COVID-19 declaration under Section 564(b)(1) of the Act, 21 U.S.C. section 360bbb-3(b)(1), unless the authorization is terminated or revoked.  Performed at [facility], [place] [address] [place], [place] [address]   MRSA Next Gen by PCR, Nasal     Status: None   Collection Time: 01/05/22  9:57 PM   Specimen: Nasal Mucosa; Nasal Swab  Result Value Ref Range Status   MRSA by PCR Next Gen NOT DETECTED NOT DETECTED Final    Comment: (NOTE) The GeneXpert MRSA Assay (FDA approved for NASAL specimens  only), is one component of a comprehensive MRSA colonization surveillance program. It is not intended to diagnose MRSA infection nor to guide or monitor treatment for MRSA infections. Test performance is not FDA approved in patients less than 81 years old. Performed at [facility], [place] [address], [place], [place] [address]   Culture, Respiratory w Gram Stain     Status: None   Collection Time: 01/11/22 10:26 AM   Specimen: Tracheal Aspirate; Respiratory  Result Value Ref Range Status   Specimen Description TRACHEAL ASPIRATE  Final   Special Requests NONE  Final   Gram Stain   Final    FEW WBC PRESENT,BOTH PMN AND MONONUCLEAR FEW GRAM POSITIVE COCCI IN PAIRS FEW GRAM POSITIVE COCCI IN CHAINS FEW GRAM NEGATIVE COCCI FEW SQUAMOUS EPITHELIAL CELLS PRESENT    Culture   Final    FEW KLEBSIELLA PNEUMONIAE WITHIN NORMAL RESPIRATORY FLORA Performed at [facility], [place] [address], [place], [place] [address]    Report Status 01/13/2022 FINAL  Final   Organism ID, Bacteria KLEBSIELLA PNEUMONIAE  Final      Susceptibility   Klebsiella pneumoniae - MIC*    AMPICILLIN >=32 RESISTANT Resistant     CEFAZOLIN <=4 SENSITIVE Sensitive     CEFEPIME <=0.12 SENSITIVE Sensitive     CEFTAZIDIME <=1 SENSITIVE Sensitive     CEFTRIAXONE <=0.25 SENSITIVE Sensitive     CIPROFLOXACIN <=0.25 SENSITIVE Sensitive     GENTAMICIN <=1 SENSITIVE Sensitive     IMIPENEM <=0.25 SENSITIVE Sensitive     TRIMETH/SULFA <=20 SENSITIVE Sensitive     AMPICILLIN/SULBACTAM 4 SENSITIVE Sensitive     PIP/TAZO <=4 SENSITIVE Sensitive     * FEW KLEBSIELLA PNEUMONIAE  MRSA Next Gen by PCR, Nasal     Status: None   Collection Time: 01/11/22 12:29 PM   Specimen: Nasal Mucosa; Nasal Swab  Result Value Ref Range Status   MRSA by PCR Next Gen NOT DETECTED NOT DETECTED Final    Comment: (NOTE) The GeneXpert MRSA Assay (FDA approved for NASAL specimens only), is one component of a comprehensive MRSA colonization  surveillance program. It is not intended to diagnose MRSA infection nor to guide or monitor treatment for MRSA infections. Test performance is not FDA approved in patients less than 56 years old. Performed at [facility], [place] [address] [place], [place] [address]   Culture, blood (routine x 2)  Status: None (Preliminary result)   Collection Time: 01/11/22  1:11 PM   Specimen: BLOOD  Result Value Ref Range Status   Specimen Description BLOOD RIGHT ANTECUBITAL  Final   Special Requests   Final    BOTTLES DRAWN AEROBIC AND ANAEROBIC Blood Culture adequate volume   Culture   Final    NO GROWTH 2 DAYS Performed at [facility], [address], [place], [place] [address]    Report Status PENDING  Incomplete  Culture, blood (routine x 2)     Status: None (Preliminary result)   Collection Time: 01/11/22  1:11 PM   Specimen: BLOOD  Result Value Ref Range Status   Specimen Description BLOOD RIGHT ANTECUBITAL  Final   Special Requests   Final    BOTTLES DRAWN AEROBIC ONLY Blood Culture adequate volume   Culture   Final    NO GROWTH 2 DAYS Performed at [facility], [place] [address], [place], [place] [address]    Report Status PENDING  Incomplete         Radiology Studies: DG CHEST PORT 1 VIEW  Result Date: 01/12/2022 CLINICAL DATA:  Encounter for fever EXAM: PORTABLE CHEST 1 VIEW COMPARISON:  Yesterday FINDINGS: Reticulonodular opacities bilaterally with volume loss and thick walled cavity at the right apex. The feeding tube at least reaches the stomach. Stable heart size and mediastinal contours. No acute airspace disease, pleural fluid, or air leak. IMPRESSION: Chronic lung disease and right apical cavity. No acute finding when compared to yesterday. Electronically Signed   By: Jorje Guild M.D.   On: 01/12/2022 06:27        Scheduled Meds:  carvedilol  12.5 mg Per Tube BID WC   chlorhexidine gluconate (MEDLINE KIT)  15 mL Mouth Rinse BID   Chlorhexidine  Gluconate Cloth  6 each Topical Daily   feeding supplement (PROSource TF)  45 mL Per Tube BID   folic acid  1 mg Per Tube Daily   heparin  5,000 Units Subcutaneous Q8H   levETIRAcetam  1,000 mg Per Tube Q12H   multivitamin with minerals  1 tablet Per Tube Daily   [START ON 01/14/2022] thiamine injection  100 mg Intravenous Daily   Continuous Infusions:  feeding supplement (VITAL 1.5 CAL) 1,000 mL (01/12/22 1824)   piperacillin-tazobactam (ZOSYN)  IV 3.375 g (01/13/22 1410)   thiamine injection Stopped (01/12/22 1121)     LOS: 8 days       Phillips Climes, MD [facility]   To contact the attending provider between 7A-7P or the covering provider during after hours 7P-7A, please log into the web site [email or web address] and access using universal [place] password for that web site. If you do not have the password, please call the hospital operator.  01/13/2022, 2:48 PM

## 2022-01-13 NOTE — Progress Notes (Signed)
Patient right restraint removed by MD. Right IV removed by patient. MD made aware. OK to leave IV out per MD. 02 saturation low 80's. 02 increased to 4 liters. MD notified. Patient medicated at bedside with no complaints or concerns.

## 2022-01-14 DIAGNOSIS — R509 Fever, unspecified: Secondary | ICD-10-CM

## 2022-01-14 LAB — CBC
HCT: 37.9 % — ABNORMAL LOW (ref 39.0–52.0)
Hemoglobin: 12.1 g/dL — ABNORMAL LOW (ref 13.0–17.0)
MCH: 27.1 pg (ref 26.0–34.0)
MCHC: 31.9 g/dL (ref 30.0–36.0)
MCV: 85 fL (ref 80.0–100.0)
Platelets: 194 10*3/uL (ref 150–400)
RBC: 4.46 MIL/uL (ref 4.22–5.81)
RDW: 15.9 % — ABNORMAL HIGH (ref 11.5–15.5)
WBC: 6.2 10*3/uL (ref 4.0–10.5)
nRBC: 0 % (ref 0.0–0.2)

## 2022-01-14 LAB — BASIC METABOLIC PANEL
Anion gap: 8 (ref 5–15)
BUN: 28 mg/dL — ABNORMAL HIGH (ref 8–23)
CO2: 27 mmol/L (ref 22–32)
Calcium: 9 mg/dL (ref 8.9–10.3)
Chloride: 104 mmol/L (ref 98–111)
Creatinine, Ser: 1.09 mg/dL (ref 0.61–1.24)
GFR, Estimated: >60 mL/min (ref >60)
Glucose, Bld: 128 mg/dL — ABNORMAL HIGH (ref 70–99)
Potassium: 4.4 mmol/L (ref 3.5–5.1)
Sodium: 139 mmol/L (ref 135–145)

## 2022-01-14 LAB — GLUCOSE, CAPILLARY
Glucose-Capillary: 136 mg/dL — ABNORMAL HIGH (ref 70–99)
Glucose-Capillary: 136 mg/dL — ABNORMAL HIGH (ref 70–99)

## 2022-01-14 MED ORDER — COLLAGENASE 250 UNIT/GM EX OINT
TOPICAL_OINTMENT | Freq: Two times a day (BID) | CUTANEOUS | Status: DC
  Administered 2022-01-16: 1 via TOPICAL
  Filled 2022-01-14: qty 30

## 2022-01-14 NOTE — Progress Notes (Signed)
Speech Language Pathology Treatment: Dysphagia  Patient Details Name: Frederick [name] MRN: [id number] DOB: 1961-08-04 Today's Date: 01/14/2022 Time: 2409-7353 SLP Time Calculation (min) (ACUTE ONLY): 24 min  Assessment / Plan / Recommendation Clinical Impression  Pt was drowsy upon SLP arrival, but per MD, mentation is improving today compared to previous days. He has occasional coughing after thin liquids, but also exhibits signs of dysphagia that include multiple swallows. He also outwardly appears to have reduced coordination when drinking thin liquids, which could be contributing to episodes of coughing. His mentation still does not support a PO diet, but would offer small amounts of ice chips or single sips of water with staff assistance after oral care is provided. Discussed with RN, who was present throughout this session. Will see if his mentation continues to improve to the point that he could more reliably begin a PO diet, at which time MBS could be considered if he has ongoing signs of dysphagia.    HPI HPI: Pt is a 61 year old male who presented to [facility] ED 1/31 via for seizures, concern for alcohol withdrawal. On arrival to ED, patient had ongoing focal left-sided seizure activity. He was loaded with Keppra and intubated for airway protection. ETT 1/31-2/3. EEG 2/2: severe diffuse encephalopathy. No seizures or epileptiform discharges. Pt found to have COVID-19. PMH: HTN, asthma, tuberculosis (s/p treatment at [facility]), GERD, EtOH abuse, R finger osteomyelitis with strep bacteremia. Last seen by SLP September, 2022 with recommendation for regular texture solids and thin liquids      SLP Plan  Continue with current plan of care      Recommendations for follow up therapy are one component of a multi-disciplinary discharge planning process, led by the attending physician.  Recommendations may be updated based on patient status, additional functional criteria and insurance  authorization.    Recommendations  Diet recommendations: NPO;Other(comment) (ice chips, sips water after oral care) Medication Administration: Via alternative means (could be administered crushed in puree if necessary)                Oral Care Recommendations: Oral care QID;Oral care prior to ice chip/H20 Follow Up Recommendations: Skilled nursing-short term rehab (<3 hours/day) Assistance recommended at discharge: Frequent or constant Supervision/Assistance SLP Visit Diagnosis: Dysphagia, unspecified (R13.10) Plan: Continue with current plan of care           Mahala Menghini., M.A. CCC-SLP Acute Rehabilitation Services Pager [phone] Office [phone]  01/14/2022, 10:42 AM

## 2022-01-14 NOTE — Progress Notes (Signed)
Occupational Therapy Treatment Patient Details Name: Frederick [name] MRN: [id number] DOB: 1961/11/24 Today's Date: 01/14/2022   History of present illness 61 year old man who presented to [facility] ED 1/31 via EMS for seizures, concern for alcohol withdrawal. COVID +; Intubated 01/05/22 for airway protection due to seizures; Cortak placed 01/08/22; extubated 01/08/22; agitation with ?alcohol withdrawal  PMHx significant for HTN, asthma, tuberculosis), EtOH abuse, R finger osteomyelitis with strep bacteremia, seizures with ?med compliance.   OT comments  Pt lethargic this PM, awakened easily by external stimuli but quickly falls back to sleep if stimuli discontinued. Pt able to demo insight into appropriate answers to questions but continues with inconsistent following of commands. Plan to further address cognition during functional tasks as pt more alert. Continue to rec SNF rehab at DC.   Recommendations for follow up therapy are one component of a multi-disciplinary discharge planning process, led by the attending physician.  Recommendations may be updated based on patient status, additional functional criteria and insurance authorization.    Follow Up Recommendations  Skilled nursing-short term rehab (<3 hours/day)    Assistance Recommended at Discharge Frequent or constant Supervision/Assistance  Patient can return home with the following  A lot of help with bathing/dressing/bathroom;Assistance with cooking/housework;Direct supervision/assist for medications management;Direct supervision/assist for financial management;Assist for transportation;Help with stairs or ramp for entrance;Two people to help with walking and/or transfers   Equipment Recommendations  Other (comment) (TBD pending progress)    Recommendations for Other Services      Precautions / Restrictions Precautions Precautions: Fall Precaution Comments: cortrak, mitts Restrictions Weight Bearing Restrictions: No       Mobility  Bed Mobility Overal bed mobility: Needs Assistance             General bed mobility comments: Total A to scoot up in bed. with mitts removed, pt reached overhead, attempted to guide pt to grab bedrail though would not follow command to grasp bedrail    Transfers                         Balance                                           ADL either performed or assessed with clinical judgement   ADL Overall ADL's : Needs assistance/impaired     Grooming: Total assistance;Sitting;Wash/dry face Grooming Details (indicate cue type and reason): to arouse, easily awakens with washcloth on forehead. placed washcloth in pt's hand as he attempted to reach to face with pt attempting to reach back to therapist to give washcloth back                                    Extremity/Trunk Assessment Upper Extremity Assessment Upper Extremity Assessment: Difficult to assess due to impaired cognition   Lower Extremity Assessment Lower Extremity Assessment: Defer to PT evaluation        Vision   Vision Assessment?: No apparent visual deficits   Perception     Praxis      Cognition Arousal/Alertness: Lethargic Behavior During Therapy: Flat affect Overall Cognitive Status: Impaired/Different from baseline Area of Impairment: Attention, Following commands, Orientation, Safety/judgement, Awareness, Problem solving                   Current  Attention Level: Focused   Following Commands: Follows one step commands inconsistently Safety/Judgement: Decreased awareness of deficits Awareness: Intellectual Problem Solving: Slow processing, Requires verbal cues, Requires tactile cues General Comments: very lethargic this PM, awakened easily with external stimuli but often falling back asleep; able to answer some questions appropriately like when asked what type of music he likes, "reggae". still inconsistent following of commands. RN later  reports pt had Ativan overnight        Exercises      Shoulder Instructions       General Comments VSS    Pertinent Vitals/ Pain       Pain Assessment Pain Assessment: Faces Faces Pain Scale: No hurt Pain Intervention(s): Monitored during session  Home Living                                          Prior Functioning/Environment              Frequency  Min 2X/week        Progress Toward Goals  OT Goals(current goals can now be found in the care plan section)  Progress towards OT goals: OT to reassess next treatment  Acute Rehab OT Goals Patient Stated Goal: none stated today OT Goal Formulation: With family Time For Goal Achievement: 01/24/22 Potential to Achieve Goals: Fair ADL Goals Pt Will Perform Grooming: with mod assist;bed level Additional ADL Goal #1: Pt will follow 50% of 1 step commands to increase activity participation for ADL tasks  Plan Discharge plan remains appropriate    Co-evaluation                 AM-PAC OT "6 Clicks" Daily Activity     Outcome Measure   Help from another person eating meals?: Total Help from another person taking care of personal grooming?: Total Help from another person toileting, which includes using toliet, bedpan, or urinal?: Total Help from another person bathing (including washing, rinsing, drying)?: Total Help from another person to put on and taking off regular upper body clothing?: Total Help from another person to put on and taking off regular lower body clothing?: Total 6 Click Score: 6    End of Session    OT Visit Diagnosis: Other abnormalities of gait and mobility (R26.89);Muscle weakness (generalized) (M62.81);Other symptoms and signs involving cognitive function   Activity Tolerance Patient limited by lethargy   Patient Left in bed;with call bell/phone within reach;with bed alarm set;Other (comment) (with mitts on)   Nurse Communication Mobility status         Time: [phone] OT Time Calculation (min): 16 min  Charges: OT General Charges $OT Visit: 1 Visit OT Treatments $Therapeutic Activity: 8-22 mins  Bradd Canary, OTR/L Acute Rehab Services Office: [phone]   Lorre Munroe 01/14/2022, 1:23 PM

## 2022-01-14 NOTE — Progress Notes (Signed)
°   01/13/22 1932  Assess: MEWS Score  Temp 99.8 F (37.7 C)  BP (!) 148/90  Pulse Rate (!) 108  ECG Heart Rate (!) 108  Resp (!) 22  Level of Consciousness Alert  SpO2 96 %  O2 Device Nasal Cannula  O2 Flow Rate (L/min) 1 L/min  Assess: MEWS Score  MEWS Temp 0  MEWS Systolic 0  MEWS Pulse 1  MEWS RR 1  MEWS LOC 0  MEWS Score 2  MEWS Score Color Yellow  Assess: if the MEWS score is Yellow or Red  Were vital signs taken at a resting state? Yes  Focused Assessment No change from prior assessment  Early Detection of Sepsis Score *See Row Information* Medium  MEWS guidelines implemented *See Row Information* Yes  Treat  Pain Scale PAINAD  Breathing 1  Negative Vocalization 1  Facial Expression 0  Body Language 1  Consolability 0  PAINAD Score 3  Take Vital Signs  Increase Vital Sign Frequency  Yellow: Q 2hr X 2 then Q 4hr X 2, if remains yellow, continue Q 4hrs  Escalate  MEWS: Escalate Yellow: discuss with charge nurse/RN and consider discussing with provider and RRT  Notify: Charge Nurse/RN  Name of Charge Nurse/RN Notified Liam Rogers  Date Charge Nurse/RN Notified 01/14/22  Time Charge Nurse/RN Notified 1932  Notify: Provider  Provider Name/Title Odie Sera MD  Date Provider Notified 01/14/22  Time Provider Notified 1932  Notification Type Page  Notification Reason Other (Comment) (yellow MEWS)  Provider response No new orders  Document  Patient Outcome Other (Comment) (monitor MEWS)  Progress note created (see row info) Yes

## 2022-01-14 NOTE — Progress Notes (Signed)
PROGRESS NOTE    Frederick [name]  GOT:[id number] DOB: December 18, 1960 DOA: 01/05/2022 PCP: [name]    Chief Complaint  Patient presents with   Seizures    Brief Narrative:  61 year old man who presented to [facility] ED 1/31 via EMS for seizures, concern for alcohol withdrawal. PMHx significant for HTN, asthma, tuberculosis (s/p treatment at [facility]), EtOH abuse, R finger osteomyelitis with strep bacteremia.  Of note, patient has seizure history with unclear medication compliance.   Per report, patient experienced several episodes of seizure activity at home.  EMS was called and patient was continuing to seize on arrival.  On arrival to ED, patient had ongoing focal left-sided seizure activity.  He was intubated for airway protection.  Keppra load was administered and EEG was obtained.  Neurology was consulted.  1/31 - BIB EMS after seizure at home, persistently seizing in ED with focal seizure activity of L hand. Difficult to sedate, history of EtOH abuse/withdrawal. Intubated for airway protection. C/f ongoing seizure, Neuro consult, EEG ordered. 2/1 - Burst suppression pattern on EEG 2/2 - Sedation weaned with no recurrence of clinical seizures.  2/3 - WUA/SBT, extubated 2/6 - New fever. Precedex weaned off.  2/8 -transferred to progressive unit under [facility]. Assessment & Plan:   Principal Problem:   Status epilepticus ([place]) Active Problems:   EtOH dependence ([place])   Cavitary lesion of lung   CKD (chronic kidney disease), stage III (HCC)   Protein-calorie malnutrition, severe   Acute metabolic encephalopathy    Status Epilepticus  Acute metabolic encephalopathy  History of Alcohol Abuse  Delirium -Seizure management per neurology, patient currently on Keppra 1000 mg p.o. twice daily, and as needed Ativan as well. -Continue seizure precautions, continue with delirium precaution as well -He is on CIWA protocol. -He remains with significant  hospital delirium, but he is more awake and appropriate today after starting on Seroquel yesterday, now at 25 of Seroquel at bedtime without much change.    Acute hypoxic respiratory failure 2/2 post ictal state COVID-19 infection (incidental) History of TB s/p treatment (2016)  History of Asthma  -Remains altered, but will encourage incentive spirometer and flutter valve when he is more awake and appropriate. -He was on 4 L nasal cannula yesterday, he is weaned down to 1 L today.   Febrile  CXR obtained and unchanged to prior. Given increased respiratory rate, Zosyn started for potential aspiration pneumonia. Patient has severe dysphagia with multiple aspiration pneumonia incidences in the past. Expectorated sputum results pending. Blood cultures pending.  -Continue with Zosyn, will discontinue in 1 to 2 days if he remains afebrile.  Sinus Tachycardia HR gradually increased since the evening of 2/6.  -Blood pressure is elevated, as well heart rate is elevated, will increase Coreg .   Hypertension  Blood pressure increased over the last 24 hours. Only BP medication at this time is low-dose Coreg.   -Blood pressure improved after increasing Coreg to 25 mg BID   Dysphagia  - Continue SLP evaluation and treatment - Nutrition via Tube Feeds    HFrEF  TEE in September 2022 with TEE demonstrating EF of 45%.    History of DVT  On Apixaban prior to arrival. Complated over 3 months of treatment. No indication for continued anti-coagulation   - DVT ppx only    GERD - PPI   DVT prophylaxis: Heparin Code Status: Full Family Communication: None at bedside Disposition:   Status is: Inpatient Remains inpatient appropriate because: npo,  on IV ABX and tube feed           Consultants:  PCCM Neurology   Subjective:  Patient required his restraint to be reinitiated yesterday, but so far this morning he has been tolerating only mittens with no requirements of wrist  restraints.  Objective: Vitals:   01/14/22 0500 01/14/22 0801 01/14/22 0958 01/14/22 1201  BP:  (!) 150/77  111/66  Pulse:  100  95  Resp:  (!) 22 20 (!) 24  Temp:  99.6 F (37.6 C)    TempSrc:  Axillary    SpO2:  90%  98%  Weight: 79.1 kg     Height:        Intake/Output Summary (Last 24 hours) at 01/14/2022 1512 Last data filed at 01/14/2022 1509 Gross per 24 hour  Intake 2246.62 ml  Output 925 ml  Net 1321.62 ml   Filed Weights   01/11/22 0500 01/13/22 0500 01/14/22 0500  Weight: 77.5 kg 77 kg 79.1 kg    Examination:  He is more awake and appropriate today, but remains significantly confused and altered, able to follow some commands and answer some questions as well.   Symmetrical Chest wall movement, Good air movement bilaterally, CTAB RRR,No Gallops,Rubs or new Murmurs, No Parasternal Heave +ve B.Sounds, Abd Soft, No tenderness, No rebound - guarding or rigidity. No Cyanosis, Clubbing or edema, No new Rash or bruise       Data Reviewed: I have personally reviewed following labs and imaging studies  CBC: Recent Labs  Lab 01/08/22 0133 01/11/22 0817 01/12/22 0513 01/14/22 0357  WBC 7.4 6.0 9.1 6.2  NEUTROABS 4.9 3.6 6.0  --   HGB 12.8* 11.6* 12.2* 12.1*  HCT 39.4 36.8* 36.5* 37.9*  MCV 85.8 85.4 84.1 85.0  PLT 145* 167 181 166    Basic Metabolic Panel: Recent Labs  Lab 01/07/22 1523 01/07/22 1906 01/08/22 0133 01/08/22 0724 01/09/22 0757 01/09/22 1137 01/11/22 0817 01/11/22 1311 01/12/22 0513 01/12/22 1122 01/13/22 0158 01/13/22 0609 01/13/22 1008 01/13/22 1426 01/14/22 0357  NA  --   --  134*  --  138  --  137  --  140  --   --   --   --   --  139  K  --   --  5.0  --  4.7  --  4.2  --  4.2  --   --   --   --   --  4.4  CL  --   --  106  --  107  --  102  --  102  --   --   --   --   --  104  CO2  --   --  21*  --  24  --  22  --  29  --   --   --   --   --  27  GLUCOSE 134*   < > 135*   128*   < > 126*   < > 127*   145*   < > 130*   128*    < > 139* 130* 140* 130* 128*  BUN  --   --  16  --  15  --  18  --  20  --   --   --   --   --  28*  CREATININE  --   --  0.99  --  0.81  --  0.86  --  1.01  --   --   --   --   --  1.09  CALCIUM  --   --  8.1*  --  9.0  --  8.4*  --  8.9  --   --   --   --   --  9.0  MG 2.2  --  2.0  --   --   --  1.6*  --  1.9  --   --   --   --   --   --   PHOS 4.2  --  3.4  --   --   --   --   --   --   --   --   --   --   --   --    < > = values in this interval not displayed.    GFR: Estimated Creatinine Clearance: 79.6 mL/min (by C-G formula based on SCr of 1.09 mg/dL).  Liver Function Tests: Recent Labs  Lab 01/08/22 0133 01/09/22 0757 01/12/22 0513  AST 18 32 32  ALT _0 ALKPHOS 91 92 74  BILITOT 0.4 0.2* 0.4  PROT 6.3* 6.7 7.5  ALBUMIN 2.3* 2.4* 2.8*    CBG: Recent Labs  Lab 01/11/22 1833 01/11/22 2047 01/12/22 0042 01/12/22 0412 01/12/22 0512  GLUCAP 19* 114* 110* 11* 123*     Recent Results (from the past 240 hour(s))  Resp Panel by RT-PCR (Flu A&B, Covid) Nasopharyngeal Swab     Status: Abnormal   Collection Time: 01/05/22  5:17 PM   Specimen: Nasopharyngeal Swab; Nasopharyngeal(NP) swabs in vial transport medium  Result Value Ref Range Status   SARS Coronavirus 2 by RT PCR POSITIVE (A) NEGATIVE Final    Comment: (NOTE) SARS-CoV-2 target nucleic acids are DETECTED.  The SARS-CoV-2 RNA is generally detectable in upper respiratory specimens during the acute phase of infection. Positive results are indicative of the presence of the identified virus, but do not rule out bacterial infection or co-infection with other pathogens not detected by the test. Clinical correlation with patient history and other diagnostic information is necessary to determine patient infection status. The expected result is Negative.  Fact Sheet for Patients: EntrepreneurPulse.com.au  Fact Sheet for Healthcare  Providers: IncredibleEmployment.be  This test is not yet approved or cleared by the [place] FDA and  has been authorized for detection and/or diagnosis of SARS-CoV-2 by FDA under an Emergency Use Authorization (EUA).  This EUA will remain in effect (meaning this test can be used) for the duration of  the COVID-19 declaration under Section 564(b)(1) of the A ct, 21 U.S.C. section 360bbb-3(b)(1), unless the authorization is terminated or revoked sooner.     Influenza A by PCR NEGATIVE NEGATIVE Final   Influenza B by PCR NEGATIVE NEGATIVE Final    Comment: (NOTE) The Xpert Xpress SARS-CoV-2/FLU/RSV plus assay is intended as an aid in the diagnosis of influenza from Nasopharyngeal swab specimens and should not be used as a sole basis for treatment. Nasal washings and aspirates are unacceptable for Xpert Xpress SARS-CoV-2/FLU/RSV testing.  Fact Sheet for Patients: EntrepreneurPulse.com.au  Fact Sheet for Healthcare Providers: IncredibleEmployment.be  This test is not yet approved or cleared by the [place] FDA and has been authorized for detection and/or diagnosis of SARS-CoV-2 by FDA under an Emergency Use Authorization (EUA). This EUA will remain in effect (meaning this test can be used) for the duration of the COVID-19 declaration under Section 564(b)(1) of the Act, 21 U.S.C. section 360bbb-3(b)(1), unless the authorization is terminated or revoked.  Performed at [facility]  Lab, [address], [place], [place] [address]   MRSA Next Gen by PCR, Nasal     Status: None   Collection Time: 01/05/22  9:57 PM   Specimen: Nasal Mucosa; Nasal Swab  Result Value Ref Range Status   MRSA by PCR Next Gen NOT DETECTED NOT DETECTED Final    Comment: (NOTE) The GeneXpert MRSA Assay (FDA approved for NASAL specimens only), is one component of a comprehensive MRSA colonization surveillance program. It is not intended to  diagnose MRSA infection nor to guide or monitor treatment for MRSA infections. Test performance is not FDA approved in patients less than 34 years old. Performed at [facility], [place] [name], [place], [place] [address]   Culture, Respiratory w Gram Stain     Status: None   Collection Time: 01/11/22 10:26 AM   Specimen: Tracheal Aspirate; Respiratory  Result Value Ref Range Status   Specimen Description TRACHEAL ASPIRATE  Final   Special Requests NONE  Final   Gram Stain   Final    FEW WBC PRESENT,BOTH PMN AND MONONUCLEAR FEW GRAM POSITIVE COCCI IN PAIRS FEW GRAM POSITIVE COCCI IN CHAINS FEW GRAM NEGATIVE COCCI FEW SQUAMOUS EPITHELIAL CELLS PRESENT    Culture   Final    FEW KLEBSIELLA PNEUMONIAE WITHIN NORMAL RESPIRATORY FLORA Performed at [facility], [place] [address], [place],  [place] [address]    Report Status 01/13/2022 FINAL  Final   Organism ID, Bacteria KLEBSIELLA PNEUMONIAE  Final      Susceptibility   Klebsiella pneumoniae - MIC*    AMPICILLIN >=32 RESISTANT Resistant     CEFAZOLIN <=4 SENSITIVE Sensitive     CEFEPIME <=0.12 SENSITIVE Sensitive     CEFTAZIDIME <=1 SENSITIVE Sensitive     CEFTRIAXONE <=0.25 SENSITIVE Sensitive     CIPROFLOXACIN <=0.25 SENSITIVE Sensitive     GENTAMICIN <=1 SENSITIVE Sensitive     IMIPENEM <=0.25 SENSITIVE Sensitive     TRIMETH/SULFA <=20 SENSITIVE Sensitive     AMPICILLIN/SULBACTAM 4 SENSITIVE Sensitive     PIP/TAZO <=4 SENSITIVE Sensitive     * FEW KLEBSIELLA PNEUMONIAE  MRSA Next Gen by PCR, Nasal     Status: None   Collection Time: 01/11/22 12:29 PM   Specimen: Nasal Mucosa; Nasal Swab  Result Value Ref Range Status   MRSA by PCR Next Gen NOT DETECTED NOT DETECTED Final    Comment: (NOTE) The GeneXpert MRSA Assay (FDA approved for NASAL specimens only), is one component of a comprehensive MRSA colonization surveillance program. It is not intended to diagnose MRSA infection nor to guide or monitor treatment for  MRSA infections. Test performance is not FDA approved in patients less than 63 years old. Performed at [facility], [place] [address] [place], [place] [address]   Culture, blood (routine x 2)     Status: None (Preliminary result)   Collection Time: 01/11/22  1:11 PM   Specimen: BLOOD  Result Value Ref Range Status   Specimen Description BLOOD RIGHT ANTECUBITAL  Final   Special Requests   Final    BOTTLES DRAWN AEROBIC AND ANAEROBIC Blood Culture adequate volume   Culture   Final    NO GROWTH 3 DAYS Performed at [facility], [place] [address], [place], [place] [address]    Report Status PENDING  Incomplete  Culture, blood (routine x 2)     Status: None (Preliminary result)   Collection Time: 01/11/22  1:11 PM   Specimen: BLOOD  Result  Value Ref Range Status   Specimen Description BLOOD RIGHT ANTECUBITAL  Final   Special Requests   Final    BOTTLES DRAWN AEROBIC ONLY Blood Culture adequate volume   Culture   Final    NO GROWTH 3 DAYS Performed at [facility], [address], [place], [place] [address]    Report Status PENDING  Incomplete         Radiology Studies: No results found.      Scheduled Meds:  carvedilol  25 mg Per Tube BID WC   chlorhexidine gluconate (MEDLINE KIT)  15 mL Mouth Rinse BID   Chlorhexidine Gluconate Cloth  6 each Topical Daily   collagenase   Topical BID   feeding supplement (PROSource TF)  45 mL Per Tube BID   folic acid  1 mg Per Tube Daily   heparin  5,000 Units Subcutaneous Q8H   levETIRAcetam  1,000 mg Per Tube Q12H   multivitamin with minerals  1 tablet Per Tube Daily   QUEtiapine  25 mg Oral QHS   thiamine injection  100 mg Intravenous Daily   Continuous Infusions:  feeding supplement (VITAL 1.5 CAL) 60 mL/hr at 01/14/22 1400   piperacillin-tazobactam (ZOSYN)  IV 12.5 mL/hr at 01/14/22 1509     LOS: 9 days       Phillips Climes, MD [facility]   To contact the attending provider between 7A-7P  or the covering provider during after hours 7P-7A, please log into the web site [email or web address] and access using universal [place] password for that web site. If you do not have the password, please call the hospital operator.  01/14/2022, 3:12 PM

## 2022-01-14 NOTE — Consult Note (Signed)
WOC Nurse Consult Note: Patient receiving care in [facility] [phone] Reason for Consult: Buttock wound Wound type: Unstageabe PI in the intergluteal cleft Pressure Injury POA: No Measurement: 3.4 cm x 2 cm x 0.2 cm Wound bed: 100% yellow Drainage (amount, consistency, odor) None Periwound: Intact Dressing procedure/placement/frequency: Clean the entire buttock area with no rinse cleanser. Pat dry and apply a nickel thick layer of Santyl on the wound, cover with moistened saline gauze, dry gauze and Sacral foam dressing upside down to prevent soiling. Apply twice daily.  Monitor the wound area(s) for worsening of condition such as: Signs/symptoms of infection, increase in size, development of or worsening of odor, development of pain, or increased pain at the affected locations.   Notify the medical team if any of these develop.  Thank you for the consult. WOC nurse will follow weekly. Please re-consult if needed.   Renaldo Reel Katrinka Blazing, MSN, RN, CMSRN, Angus Seller, [facility] Wound Treatment Associate Pager [phone]

## 2022-01-14 NOTE — Progress Notes (Signed)
Pharmacy Antibiotic Note  Frederick [name] is a 61 y.o. male admitted on 01/05/2022 with pneumonia.  Pharmacy was consulted on 2/6 for Zosyn dosing. - tmax 100.2.5, Tc 99.6, WBC 6.2k wnl  Scr 0.86>1.01>1.09, BUN 20>28 (CrCl 79 ml/min)   Zosyn 2/6 >>    2/6 bcx: no growth to date x 3 days   2/6 resp: few klebsiella pneumoniae w/in normal flora:  Pan sens except resistant to ampicillin.  1/31 MRSA PCR - negative  Plan: Continue Zosyn 3.375g IV q8h (4 hour infusion). Will monitor for acute changes in renal function and adjust as needed F/u cultures results and de-escalate as appropriate  Height: 6\' 1"  (185.4 cm) Weight: 79.1 kg (174 lb 6.1 oz) IBW/kg (Calculated) : 79.9  Temp (24hrs), Avg:99.4 F (37.4 C), Min:98.1 F (36.7 C), Max:100.2 F (37.9 C)  Recent Labs  Lab 01/08/22 0133 01/09/22 0757 01/11/22 0817 01/12/22 0513 01/14/22 0357  WBC 7.4  --  6.0 9.1 6.2  CREATININE 0.99 0.81 0.86 1.01 1.09     Estimated Creatinine Clearance: 79.6 mL/min (by C-G formula based on SCr of 1.09 mg/dL).    No Known Allergies  Thank you for allowing pharmacy to be a part of this patients care.  03/14/22, RPh Clinical Pharmacist [phone] 01/14/2022 1:35 PM  Please check AMION for all [facility] Pharmacy numbers After 10:00 PM, call Main Pharmacy [phone]

## 2022-01-15 LAB — GLUCOSE, CAPILLARY
Glucose-Capillary: 105 mg/dL — ABNORMAL HIGH (ref 70–99)
Glucose-Capillary: 137 mg/dL — ABNORMAL HIGH (ref 70–99)
Glucose-Capillary: 105 mg/dL — ABNORMAL HIGH (ref 70–99)
Glucose-Capillary: 128 mg/dL — ABNORMAL HIGH (ref 70–99)

## 2022-01-15 MED ORDER — SODIUM CHLORIDE 0.9 % IV SOLN
2.0000 g | INTRAVENOUS | Status: DC
  Administered 2022-01-15 – 2022-01-19 (×5): 2 g via INTRAVENOUS
  Filled 2022-01-15 (×5): qty 20

## 2022-01-15 NOTE — Progress Notes (Signed)
PROGRESS NOTE    Frederick [name]  IHW:[id number] DOB: [birth year] DOA: 01/05/2022 PCP: [name]    Chief Complaint  Patient presents with   Seizures    Brief Narrative:  61 year old man who presented to [facility] ED 1/31 via EMS for seizures, concern for alcohol withdrawal. PMHx significant for HTN, asthma, tuberculosis (s/p treatment at [facility]), EtOH abuse, R finger osteomyelitis with strep bacteremia.  Of note, patient has seizure history with unclear medication compliance.   Per report, patient experienced several episodes of seizure activity at home.  EMS was called and patient was continuing to seize on arrival.  On arrival to ED, patient had ongoing focal left-sided seizure activity.  He was intubated for airway protection.  Keppra load was administered and EEG was obtained.  Neurology was consulted.  1/31 - BIB EMS after seizure at home, persistently seizing in ED with focal seizure activity of L hand. Difficult to sedate, history of EtOH abuse/withdrawal. Intubated for airway protection. C/f ongoing seizure, Neuro consult, EEG ordered. 2/1 - Burst suppression pattern on EEG 2/2 - Sedation weaned with no recurrence of clinical seizures.  2/3 - WUA/SBT, extubated 2/6 - New fever. Precedex weaned off.  2/8 -transferred to progressive unit under [facility].  Assessment & Plan:   Principal Problem:   Status epilepticus ([place]) Active Problems:   EtOH dependence ([place])   Cavitary lesion of lung   CKD (chronic kidney disease), stage III (HCC)   Protein-calorie malnutrition, severe   Acute metabolic encephalopathy    Status Epilepticus  Acute metabolic encephalopathy  History of Alcohol Abuse  Delirium -Seizure management per neurology, patient currently on Keppra 1000 mg p.o. twice daily, and as needed Ativan as well. -Continue seizure precautions, continue with delirium precaution as well -He is on CIWA protocol. -He remains with significant  hospital delirium, he is gradually improving, more awake and interactive, continue with current dose Seroquel , [place] as needed Ativan overnight.  . -He is still unable to pass his swallow evaluation, so we will continue with tube feed for now.     Acute hypoxic respiratory failure 2/2 post ictal state COVID-19 infection (incidental) History of TB s/p treatment (2016)  History of Asthma  -Remains altered, but will encourage incentive spirometer and flutter valve when he is more awake and appropriate. -He was on 4 L nasal cannula yesterday, he is weaned down to 1 L today.   Febrile [place] pneumonia CXR obtained and unchanged to prior. Given increased respiratory rate, Zosyn started for potential aspiration pneumonia. Patient has severe dysphagia with multiple aspiration pneumonia incidences in the past. Expectorated sputum results pending. Blood cultures pending.  -Patient is treated with Zosyn, sputum culture growing Klebsiella, sensitive to Rocephin, will narrow antibiotics.  Sinus Tachycardia HR gradually increased since the evening of 2/6.  -Blood pressure is elevated, as well heart rate is elevated, will increase Coreg .   Hypertension  Blood pressure increased over the last 24 hours. Only BP medication at this time is low-dose Coreg.   -Blood pressure improved after increasing Coreg to 25 mg BID   Dysphagia  - Continue SLP evaluation and treatment - Nutrition via Tube Feeds    HFrEF  TEE in September 2022 with TEE demonstrating EF of 45%.    History of DVT  On Apixaban prior to arrival. Complated over 3 months of treatment. No indication for continued anti-coagulation  - DVT ppx only    GERD - PPI   Pressure ulcer : -  Continue with wound care. Pressure Injury 01/13/22 Buttocks Mid Unstageable - Full thickness tissue loss in which the base of the injury is covered by slough (yellow, tan, gray, green or brown) and/or eschar (tan, brown or black) in the wound bed. yellow  (Active)  01/13/22 2000  Location: Buttocks  Location Orientation: Mid  Staging: Unstageable - Full thickness tissue loss in which the base of the injury is covered by slough (yellow, tan, gray, green or brown) and/or eschar (tan, brown or black) in the wound bed.  Wound Description (Comments): yellow  Present on Admission: No        DVT prophylaxis: Heparin Code Status: Full Family Communication: None at bedside Disposition:   Status is: Inpatient Remains inpatient appropriate because: npo, on IV ABX and tube feed           Consultants:  PCCM Neurology   Subjective:  Patient required his restraint to be reinitiated yesterday, but so far this morning he has been tolerating only mittens with no requirements of wrist restraints.  Objective: Vitals:   01/15/22 0000 01/15/22 0400 01/15/22 0716 01/15/22 0800  BP: 130/74  135/78 136/78  Pulse: 100  98 97  Resp: (!) 21  (!) 25 20  Temp: 98.2 F (36.8 C) 98.5 F (36.9 C)  98.6 F (37 C)  TempSrc: Axillary Axillary  Oral  SpO2: (!) 21%  95% 100%  Weight:      Height:        Intake/Output Summary (Last 24 hours) at 01/15/2022 1150 Last data filed at 01/15/2022 0205 Gross per 24 hour  Intake 1190.11 ml  Output 900 ml  Net 290.11 ml   Filed Weights   01/11/22 0500 01/13/22 0500 01/14/22 0500  Weight: 77.5 kg 77 kg 79.1 kg    Examination:  He appears to be more awake and appropriate today, more coherent and conversant, but still mildly confused.  Symmetrical Chest wall movement, Good air movement bilaterally, CTAB RRR,No Gallops,Rubs or new Murmurs, No Parasternal Heave +ve B.Sounds, Abd Soft, No tenderness, No rebound - guarding or rigidity. No Cyanosis, Clubbing or edema, No new Rash or bruise      Data Reviewed: I have personally reviewed following labs and imaging studies  CBC: Recent Labs  Lab 01/11/22 0817 01/12/22 0513 01/14/22 0357  WBC 6.0 9.1 6.2  NEUTROABS 3.6 6.0  --   HGB 11.6* 12.2*  12.1*  HCT 36.8* 36.5* 37.9*  MCV 85.4 84.1 85.0  PLT 167 181 767    Basic Metabolic Panel: Recent Labs  Lab 01/09/22 0757 01/09/22 1137 01/11/22 0817 01/11/22 1311 01/12/22 0513 01/12/22 1122 01/13/22 0158 01/13/22 0609 01/13/22 1008 01/13/22 1426 01/14/22 0357  NA 138  --  137  --  140  --   --   --   --   --  139  K 4.7  --  4.2  --  4.2  --   --   --   --   --  4.4  CL 107  --  102  --  102  --   --   --   --   --  104  CO2 24  --  22  --  29  --   --   --   --   --  27  GLUCOSE 126*   < > 127*   145*   < > 130*   128*   < > 139* 130* 140* 130* 128*  BUN 15  --  18  --  20  --   --   --   --   --  28*  CREATININE 0.81  --  0.86  --  1.01  --   --   --   --   --  1.09  CALCIUM 9.0  --  8.4*  --  8.9  --   --   --   --   --  9.0  MG  --   --  1.6*  --  1.9  --   --   --   --   --   --    < > = values in this interval not displayed.    GFR: Estimated Creatinine Clearance: 79.6 mL/min (by C-G formula based on SCr of 1.09 mg/dL).  Liver Function Tests: Recent Labs  Lab 01/09/22 0757 01/12/22 0513  AST 32 32  ALT 11 14  ALKPHOS 92 74  BILITOT 0.2* 0.4  PROT 6.7 7.5  ALBUMIN 2.4* 2.8*    CBG: Recent Labs  Lab 01/12/22 0412 01/12/22 0512 01/14/22 1844 01/14/22 2117 01/15/22 0843  GLUCAP 11* 123* 136* 136* 105*     Recent Results (from the past 240 hour(s))  Resp Panel by RT-PCR (Flu A&B, Covid) Nasopharyngeal Swab     Status: Abnormal   Collection Time: 01/05/22  5:17 PM   Specimen: Nasopharyngeal Swab; Nasopharyngeal(NP) swabs in vial transport medium  Result Value Ref Range Status   SARS Coronavirus 2 by RT PCR POSITIVE (A) NEGATIVE Final    Comment: (NOTE) SARS-CoV-2 target nucleic acids are DETECTED.  The SARS-CoV-2 RNA is generally detectable in upper respiratory specimens during the acute phase of infection. Positive results are indicative of the presence of the identified virus, but do not rule out bacterial infection or co-infection with  other pathogens not detected by the test. Clinical correlation with patient history and other diagnostic information is necessary to determine patient infection status. The expected result is Negative.  Fact Sheet for Patients: EntrepreneurPulse.com.au  Fact Sheet for Healthcare Providers: IncredibleEmployment.be  This test is not yet approved or cleared by the [place] FDA and  has been authorized for detection and/or diagnosis of SARS-CoV-2 by FDA under an Emergency Use Authorization (EUA).  This EUA will remain in effect (meaning this test can be used) for the duration of  the COVID-19 declaration under Section 564(b)(1) of the A ct, 21 U.S.C. section 360bbb-3(b)(1), unless the authorization is terminated or revoked sooner.     Influenza A by PCR NEGATIVE NEGATIVE Final   Influenza B by PCR NEGATIVE NEGATIVE Final    Comment: (NOTE) The Xpert Xpress SARS-CoV-2/FLU/RSV plus assay is intended as an aid in the diagnosis of influenza from Nasopharyngeal swab specimens and should not be used as a sole basis for treatment. Nasal washings and aspirates are unacceptable for Xpert Xpress SARS-CoV-2/FLU/RSV testing.  Fact Sheet for Patients: EntrepreneurPulse.com.au  Fact Sheet for Healthcare Providers: IncredibleEmployment.be  This test is not yet approved or cleared by the [place] FDA and has been authorized for detection and/or diagnosis of SARS-CoV-2 by FDA under an Emergency Use Authorization (EUA). This EUA will remain in effect (meaning this test can be used) for the duration of the COVID-19 declaration under Section 564(b)(1) of the Act, 21 U.S.C. section 360bbb-3(b)(1), unless the authorization is terminated or revoked.  Performed at [facility], [place] [address], [place], [place] [address]   MRSA Next Gen by PCR, Nasal     Status: None  Collection Time: 01/05/22  9:57 PM    Specimen: Nasal Mucosa; Nasal Swab  Result Value Ref Range Status   MRSA by PCR Next Gen NOT DETECTED NOT DETECTED Final    Comment: (NOTE) The GeneXpert MRSA Assay (FDA approved for NASAL specimens only), is one component of a comprehensive MRSA colonization surveillance program. It is not intended to diagnose MRSA infection nor to guide or monitor treatment for MRSA infections. Test performance is not FDA approved in patients less than 50 years old. Performed at [facility], [place] [address] [place], [place] [address]   Culture, Respiratory w Gram Stain     Status: None   Collection Time: 01/11/22 10:26 AM   Specimen: Tracheal Aspirate; Respiratory  Result Value Ref Range Status   Specimen Description TRACHEAL ASPIRATE  Final   Special Requests NONE  Final   Gram Stain   Final    FEW WBC PRESENT,BOTH PMN AND MONONUCLEAR FEW GRAM POSITIVE COCCI IN PAIRS FEW GRAM POSITIVE COCCI IN CHAINS FEW GRAM NEGATIVE COCCI FEW SQUAMOUS EPITHELIAL CELLS PRESENT    Culture   Final    FEW KLEBSIELLA PNEUMONIAE WITHIN NORMAL RESPIRATORY FLORA Performed at [facility], [place] [address], [place], [place] [address]    Report Status 01/13/2022 FINAL  Final   Organism ID, Bacteria KLEBSIELLA PNEUMONIAE  Final      Susceptibility   Klebsiella pneumoniae - MIC*    AMPICILLIN >=32 RESISTANT Resistant     CEFAZOLIN <=4 SENSITIVE Sensitive     CEFEPIME <=0.12 SENSITIVE Sensitive     CEFTAZIDIME <=1 SENSITIVE Sensitive     CEFTRIAXONE <=0.25 SENSITIVE Sensitive     CIPROFLOXACIN <=0.25 SENSITIVE Sensitive     GENTAMICIN <=1 SENSITIVE Sensitive     IMIPENEM <=0.25 SENSITIVE Sensitive     TRIMETH/SULFA <=20 SENSITIVE Sensitive     AMPICILLIN/SULBACTAM 4 SENSITIVE Sensitive     PIP/TAZO <=4 SENSITIVE Sensitive     * FEW KLEBSIELLA PNEUMONIAE  MRSA Next Gen by PCR, Nasal     Status: None   Collection Time: 01/11/22 12:29 PM   Specimen: Nasal Mucosa; Nasal Swab  Result Value Ref Range  Status   MRSA by PCR Next Gen NOT DETECTED NOT DETECTED Final    Comment: (NOTE) The GeneXpert MRSA Assay (FDA approved for NASAL specimens only), is one component of a comprehensive MRSA colonization surveillance program. It is not intended to diagnose MRSA infection nor to guide or monitor treatment for MRSA infections. Test performance is not FDA approved in patients less than 34 years old. Performed at [facility], [place] [address] [place], [place] [address]   Culture, blood (routine x 2)     Status: None (Preliminary result)   Collection Time: 01/11/22  1:11 PM   Specimen: BLOOD  Result Value Ref Range Status   Specimen Description BLOOD RIGHT ANTECUBITAL  Final   Special Requests   Final    BOTTLES DRAWN AEROBIC AND ANAEROBIC Blood Culture adequate volume   Culture   Final    NO GROWTH 4 DAYS Performed at [facility], [place] [address] [place], [place] [address]    Report Status PENDING  Incomplete  Culture, blood (routine x 2)     Status: None (Preliminary result)   Collection Time: 01/11/22  1:11 PM   Specimen: BLOOD  Result Value Ref Range Status   Specimen Description BLOOD RIGHT ANTECUBITAL  Final   Special Requests   Final    BOTTLES  DRAWN AEROBIC ONLY Blood Culture adequate volume   Culture   Final    NO GROWTH 4 DAYS Performed at [place] [address] [place], [place] [address]    Report Status PENDING  Incomplete         Radiology Studies: No results found.      Scheduled Meds:  carvedilol  25 mg Per Tube BID WC   chlorhexidine gluconate (MEDLINE KIT)  15 mL Mouth Rinse BID   Chlorhexidine Gluconate Cloth  6 each Topical Daily   collagenase   Topical BID   feeding supplement (PROSource TF)  45 mL Per Tube BID   folic acid  1 mg Per Tube Daily   heparin  5,000 Units Subcutaneous Q8H   levETIRAcetam  1,000 mg Per Tube Q12H   multivitamin with minerals  1 tablet Per Tube Daily   QUEtiapine  25 mg Oral QHS   thiamine  injection  100 mg Intravenous Daily   Continuous Infusions:  feeding supplement (VITAL 1.5 CAL) 1,000 mL (01/15/22 1042)   piperacillin-tazobactam (ZOSYN)  IV 3.375 g (01/15/22 0454)     LOS: 10 days       Phillips Climes, MD [facility]   To contact the attending provider between 7A-7P or the covering provider during after hours 7P-7A, please log into the web site [email or web address] and access using universal [place] password for that web site. If you do not have the password, please call the hospital operator.  01/15/2022, 11:50 AM

## 2022-01-15 NOTE — Progress Notes (Signed)
Physical Therapy Treatment Patient Details Name: Frederick [name] MRN: [id number] DOB: 1961-05-03 Today's Date: 01/15/2022   History of Present Illness 61 year old man who presented to [facility] ED 1/31 via EMS for seizures, concern for alcohol withdrawal. COVID +; Intubated 01/05/22 for airway protection due to seizures; Cortak placed 01/08/22; extubated 01/08/22; agitation with ?alcohol withdrawal  PMHx significant for HTN, asthma, tuberculosis), EtOH abuse, R finger osteomyelitis with strep bacteremia, seizures with ?med compliance.    PT Comments    Continuing work on functional mobility and activity tolerance;  Much improved ability to participate, and pt performed sit to stand and basic step pivot bed to recliner transfers today with 2 person heavy moderate assist; continue to recommend post-acute rehab  Recommendations for follow up therapy are one component of a multi-disciplinary discharge planning process, led by the attending physician.  Recommendations may be updated based on patient status, additional functional criteria and insurance authorization.  Follow Up Recommendations  Skilled nursing-short term rehab (<3 hours/day)     Assistance Recommended at Discharge Frequent or constant Supervision/Assistance  Patient can return home with the following Two people to help with walking and/or transfers   Equipment Recommendations  Rolling walker (2 wheels);Wheelchair (measurements PT);Wheelchair cushion (measurements PT);Hospital bed    Recommendations for Other Services       Precautions / Restrictions Precautions Precautions: Fall Precaution Comments: Cortrak     Mobility  Bed Mobility Overal bed mobility: Needs Assistance Bed Mobility: Supine to Sit     Supine to sit: Max assist, +2 for safety/equipment     General bed mobility comments: Making a good effort to get up; needs cues for technique  and sequencing, and mod assist to help LEs off of the bed and to push up to sit     Transfers Overall transfer level: Needs assistance Equipment used: 2 person hand held assist Transfers: Sit to/from Stand, Bed to chair/wheelchair/BSC Sit to Stand: Mod assist, +2 physical assistance   Step pivot transfers: Mod assist, +2 physical assistance       General transfer comment: Heavy bilateral suport at gait  belt and both UEs to lean forward and power up to stand; Wide stance adn knees locked in hyperextension; Required heavy mod assist to steady and physically assist advancing LLE first, tehn R    Ambulation/Gait                   Stairs             Wheelchair Mobility    Modified Rankin (Stroke Patients Only)       Balance     Sitting balance-Leahy Scale: Fair Sitting balance - Comments: Very close guard for lines/leads     Standing balance-Leahy Scale: Poor                              Cognition Arousal/Alertness: Awake/alert Behavior During Therapy: WFL for tasks assessed/performed (restless at times) Overall Cognitive Status: Impaired/Different from baseline Area of Impairment: Attention, Following commands, Orientation, Safety/judgement, Awareness, Problem solving                   Current Attention Level: Sustained   Following Commands: Follows one step commands consistently Safety/Judgement: Decreased awareness of deficits Awareness: Intellectual Problem Solving: Requires verbal cues, Requires tactile cues General Comments: Lines and leads seemed to bother pt at times, and he would become restless        Exercises  General Comments General comments (skin integrity, edema, etc.): VSS      Pertinent Vitals/Pain Pain Assessment Pain Assessment: No/denies pain    Home Living                          Prior Function            PT Goals (current goals can now be found in the care plan section) Acute Rehab PT Goals Patient Stated Goal: unable to state, but agreeable to getting  OOB PT Goal Formulation: With patient Time For Goal Achievement: 01/24/22 Potential to Achieve Goals: Fair Progress towards PT goals: Progressing toward goals    Frequency    Min 2X/week      PT Plan Current plan remains appropriate    Co-evaluation              AM-PAC PT "6 Clicks" Mobility   Outcome Measure  Help needed turning from your back to your side while in a flat bed without using bedrails?: A Lot Help needed moving from lying on your back to sitting on the side of a flat bed without using bedrails?: A Lot Help needed moving to and from a bed to a chair (including a wheelchair)?: Total Help needed standing up from a chair using your arms (e.g., wheelchair or bedside chair)?: Total Help needed to walk in hospital room?: Total Help needed climbing 3-5 steps with a railing? : Total 6 Click Score: 8    End of Session Equipment Utilized During Treatment: Gait belt Activity Tolerance: Patient tolerated treatment well Patient left: in chair;with call bell/phone within reach;with chair alarm set Nurse Communication: Mobility status;Other (comment) (2 person assist for basic pivot back to bed) PT Visit Diagnosis: Muscle weakness (generalized) (M62.81);Difficulty in walking, not elsewhere classified (R26.2)     Time: 1335-1401 PT Time Calculation (min) (ACUTE ONLY): 26 min  Charges:  $Therapeutic Activity: 23-37 mins                     Van Clines, PT  Acute Rehabilitation Services Pager [phone] Office [phone]    Levi Aland 01/15/2022, 3:33 PM

## 2022-01-15 NOTE — Progress Notes (Signed)
Speech Language Pathology Treatment: Dysphagia  Patient Details Name: Frederick [name] MRN: [id number] DOB: 06-May-1961 Today's Date: 01/15/2022 Time: 5188-4166 SLP Time Calculation (min) (ACUTE ONLY): 18 min  Assessment / Plan / Recommendation Clinical Impression  Pt was seen for ongoing dysphagia tx and to determine readiness to complete instrumental swallow study. Although alert today, he is quite confused and a little frustrated, needing increased cueing to attend to task. He has thick secretions sitting throughout his oral cavity, for which SLP provided suction and then oral care. Oral care removed some but not all of his secretions, with ongoing work needed for the secretions that appear to be more adhered to his lips and oral mucosa. His voice is audible wet at baseline but his volitional cough was productive x1, with SLP using yankauer to remove what seemed to be a fairly large amount of secretions. SLP provided ice in an attempt to clear more secretions, with strong, prolonged coughing noted after swallowing. No more advanced trials were given today. Given AMS and reduced secretion management this morning, would continue NPO status for now.    HPI HPI: Pt is a 61 year old male who presented to [facility] ED 1/31 via for seizures, concern for alcohol withdrawal. On arrival to ED, patient had ongoing focal left-sided seizure activity. He was loaded with Keppra and intubated for airway protection. ETT 1/31-2/3. EEG 2/2: severe diffuse encephalopathy. No seizures or epileptiform discharges. Pt found to have COVID-19. PMH: HTN, asthma, tuberculosis (s/p treatment at [facility]), GERD, EtOH abuse, R finger osteomyelitis with strep bacteremia. Last seen by SLP September, 2022 with recommendation for regular texture solids and thin liquids      SLP Plan  Continue with current plan of care      Recommendations for follow up therapy are one component of a multi-disciplinary discharge planning  process, led by the attending physician.  Recommendations may be updated based on patient status, additional functional criteria and insurance authorization.    Recommendations  Diet recommendations: NPO (ice chips, sips water after oral care) Medication Administration: Via alternative means                Oral Care Recommendations: Oral care QID;Oral care prior to ice chip/H20 Follow Up Recommendations: Skilled nursing-short term rehab (<3 hours/day) Assistance recommended at discharge: Frequent or constant Supervision/Assistance SLP Visit Diagnosis: Dysphagia, unspecified (R13.10) Plan: Continue with current plan of care           Mahala Menghini., M.A. CCC-SLP Acute Rehabilitation Services Pager [phone] Office [phone]  01/15/2022, 10:54 AM

## 2022-01-16 LAB — BASIC METABOLIC PANEL
Anion gap: 8 (ref 5–15)
BUN: 33 mg/dL — ABNORMAL HIGH (ref 8–23)
CO2: 26 mmol/L (ref 22–32)
Calcium: 9 mg/dL (ref 8.9–10.3)
Chloride: 104 mmol/L (ref 98–111)
Creatinine, Ser: 0.96 mg/dL (ref 0.61–1.24)
GFR, Estimated: >60 mL/min (ref >60)
Glucose, Bld: 132 mg/dL — ABNORMAL HIGH (ref 70–99)
Potassium: 4.4 mmol/L (ref 3.5–5.1)
Sodium: 138 mmol/L (ref 135–145)

## 2022-01-16 LAB — GLUCOSE, CAPILLARY
Glucose-Capillary: 122 mg/dL — ABNORMAL HIGH (ref 70–99)
Glucose-Capillary: 117 mg/dL — ABNORMAL HIGH (ref 70–99)
Glucose-Capillary: 122 mg/dL — ABNORMAL HIGH (ref 70–99)
Glucose-Capillary: 90 mg/dL (ref 70–99)

## 2022-01-16 LAB — CULTURE, BLOOD (ROUTINE X 2)
Culture: NO GROWTH
Culture: NO GROWTH
Special Requests: ADEQUATE
Special Requests: ADEQUATE

## 2022-01-16 LAB — CBC
HCT: 37.3 % — ABNORMAL LOW (ref 39.0–52.0)
Hemoglobin: 11.6 g/dL — ABNORMAL LOW (ref 13.0–17.0)
MCH: 26.9 pg (ref 26.0–34.0)
MCHC: 31.1 g/dL (ref 30.0–36.0)
MCV: 86.3 fL (ref 80.0–100.0)
Platelets: 232 10*3/uL (ref 150–400)
RBC: 4.32 MIL/uL (ref 4.22–5.81)
RDW: 16 % — ABNORMAL HIGH (ref 11.5–15.5)
WBC: 7.8 10*3/uL (ref 4.0–10.5)
nRBC: 0 % (ref 0.0–0.2)

## 2022-01-16 MED ORDER — NICOTINE 21 MG/24HR TD PT24
21.0000 mg | MEDICATED_PATCH | Freq: Every day | TRANSDERMAL | Status: DC
  Administered 2022-01-16 – 2022-01-22 (×7): 21 mg via TRANSDERMAL
  Filled 2022-01-16 (×7): qty 1

## 2022-01-16 NOTE — Plan of Care (Signed)
Problem: Safety: Goal: Non-violent Restraint(s) Outcome: Completed/Met   

## 2022-01-16 NOTE — Progress Notes (Signed)
°PROGRESS NOTE ° ° ° °Frederick [name]  MRN:[id number] DOB: 01/01/1961 DOA: 01/05/2022 °PCP: [facility]  ° ° °Chief Complaint  °Patient presents with  ° Seizures  ° ° °Brief Narrative: ° °61-year-old man who presented to [facility] ED 1/31 via EMS for seizures, concern for alcohol withdrawal. PMHx significant for HTN, asthma, tuberculosis (s/p treatment at [facility]), EtOH abuse, R finger osteomyelitis with strep bacteremia.  Of note, patient has seizure history with unclear medication compliance. °  °Per report, patient experienced several episodes of seizure activity at home.  EMS was called and patient was continuing to seize on arrival.  On arrival to ED, patient had ongoing focal left-sided seizure activity.  He was intubated for airway protection.  Keppra load was administered and EEG was obtained.  Neurology was consulted. ° °1/31 - BIB EMS after seizure at home, persistently seizing in ED with focal seizure activity of L hand. Difficult to sedate, history of EtOH abuse/withdrawal. Intubated for airway protection. C/f ongoing seizure, Neuro consult, EEG ordered. °2/1 - Burst suppression pattern on EEG °2/2 - Sedation weaned with no recurrence of clinical seizures.  °2/3 - WUA/SBT, extubated °2/6 - New fever. Precedex weaned off.  °2/8 -transferred to progressive unit under [facility]. ° °Assessment & Plan: °  °Principal Problem: °  Status epilepticus (HCC) °Active Problems: °  EtOH dependence (HCC) °  Cavitary lesion of lung °  CKD (chronic kidney disease), stage III (HCC) °  Protein-calorie malnutrition, severe °  Acute metabolic encephalopathy ° ° ° °Status Epilepticus  °Acute metabolic encephalopathy  °History of Alcohol Abuse  °Delirium °-Seizure management per neurology, patient currently on Keppra 1000 mg p.o. twice daily, and as needed Ativan as well. °-Continue seizure precautions, continue with delirium precaution as well °-He is on CIWA protocol. °-He remains with significant  hospital delirium, he is gradually improving, more awake and interactive, continue with current dose Seroquel , [place] as needed Ativan overnight.  . °-He is still unable to pass his swallow evaluation, so we will continue with tube feed for now.   °-Mentation has been gradually improving, he certainly had some hospital delirium which appears to be improving, continue with current dose Seroquel, avoid narcotics and benzo diazepam as possible, continue with delirium precaution. °  °Acute hypoxic respiratory failure 2/2 post ictal state °COVID-19 infection (incidental) °History of TB s/p treatment (2016)  °History of Asthma  °-Remains altered, but will encourage incentive spirometer and flutter valve when he is more awake and appropriate. °-Continue to wean oxygen as tolerated. °  °Febrile /Klebsiella pneumonia °CXR obtained and unchanged to prior. Given increased respiratory rate, Zosyn started for potential aspiration pneumonia. Patient has severe dysphagia with multiple aspiration pneumonia incidences in the past. Expectorated sputum results pending. Blood cultures pending.  °-Patient is treated with Zosyn, sputum culture growing Klebsiella, sensitive to Rocephin, will narrow antibiotics. ° °Sinus Tachycardia °HR gradually increased since the evening of 2/6.  °-Blood pressure is elevated, as well heart rate is elevated, will increase Coreg . °  °Hypertension  °Blood pressure increased over the last 24 hours. Only BP medication at this time is low-dose Coreg.  ° -Blood pressure improved after increasing Coreg to 25 mg BID °  °Dysphagia  °- Continue SLP evaluation and treatment °- Nutrition via Tube Feeds  °  °HFrEF  °TEE in September 2022 with TEE demonstrating EF of 45%.  °  °History of DVT  °On Apixaban prior to arrival. Complated over 3 months of   of treatment. No indication for continued anti-coagulation  - DVT ppx only    GERD - PPI   Pressure ulcer : -Continue with wound care. Pressure Injury 01/13/22  Buttocks Mid Unstageable - Full thickness tissue loss in which the base of the injury is covered by slough (yellow, tan, gray, green or brown) and/or eschar (tan, brown or black) in the wound bed. yellow (Active)  01/13/22 2000  Location: Buttocks  Location Orientation: Mid  Staging: Unstageable - Full thickness tissue loss in which the base of the injury is covered by slough (yellow, tan, gray, green or brown) and/or eschar (tan, brown or black) in the wound bed.  Wound Description (Comments): yellow  Present on Admission: No        DVT prophylaxis: Heparin Code Status: Full Family Communication: None at bedside Disposition:   Status is: Inpatient Remains inpatient appropriate because: npo, on IV ABX and tube feed           Consultants:  PCCM Neurology   Subjective:  No significant events overnight as discussed with staff, patient himself denies any complaints today, patient did not require any restraints overnight.     Objective: Vitals:   01/16/22 0200 01/16/22 0300 01/16/22 0400 01/16/22 0500  BP: 129/71  131/80   Pulse: 89     Resp: (!) 24 (!) _0 Temp:      TempSrc:      SpO2: 91%     Weight:      Height:        Intake/Output Summary (Last 24 hours) at 01/16/2022 1309 Last data filed at 01/15/2022 2038 Gross per 24 hour  Intake --  Output 800 ml  Net -800 ml   Filed Weights   01/11/22 0500 01/13/22 0500 01/14/22 0500  Weight: 77.5 kg 77 kg 79.1 kg    Examination:  Awake Alert, appropriate and coherent today, but remains mildly confused. Symmetrical Chest wall movement, Good air movement bilaterally, CTAB RRR,No Gallops,Rubs or new Murmurs, No Parasternal Heave +ve B.Sounds, Abd Soft, No tenderness, No rebound - guarding or rigidity. No Cyanosis, Clubbing or edema, No new Rash or bruise      Data Reviewed: I have personally reviewed following labs and imaging studies  CBC: Recent Labs  Lab 01/11/22 0817 01/12/22 0513  01/14/22 0357 01/16/22 0039  WBC 6.0 9.1 6.2 7.8  NEUTROABS 3.6 6.0  --   --   HGB 11.6* 12.2* 12.1* 11.6*  HCT 36.8* 36.5* 37.9* 37.3*  MCV 85.4 84.1 85.0 86.3  PLT 167 181 194 161    Basic Metabolic Panel: Recent Labs  Lab 01/11/22 0817 01/11/22 1311 01/12/22 0513 01/12/22 1122 01/13/22 0609 01/13/22 1008 01/13/22 1426 01/14/22 0357 01/16/22 0039  NA 137  --  140  --   --   --   --  139 138  K 4.2  --  4.2  --   --   --   --  4.4 4.4  CL 102  --  102  --   --   --   --  104 104  CO2 22  --  29  --   --   --   --  27 26  GLUCOSE 127*   145*   < > 130*   128*   < > 130* 140* 130* 128* 132*  BUN 18  --  20  --   --   --   --  28* 33*  CREATININE 0.86  --  1.01  --   --   --   --  1.09 0.96  °CALCIUM 8.4*  --  8.9  --   --   --   --  9.0 9.0  °MG 1.6*  --  1.9  --   --   --   --   --   --   ° < > = values in this interval not displayed.  ° ° °GFR: °Estimated Creatinine Clearance: 90.4 mL/min (by C-G formula based on SCr of 0.96 mg/dL). ° °Liver Function Tests: °Recent Labs  °Lab 01/12/22 °0513  °AST 32  °ALT 14  °ALKPHOS 74  °BILITOT 0.4  °PROT 7.5  °ALBUMIN 2.8*  ° ° °CBG: °Recent Labs  °Lab 01/15/22 °1220 01/15/22 °1641 01/15/22 °2034 01/16/22 °0758 01/16/22 °1236  °GLUCAP 137* 105* 128* 122* 117*  ° ° ° °Recent Results (from the past 240 hour(s))  °Culture, Respiratory w Gram Stain     Status: None  ° Collection Time: 01/11/22 10:26 AM  ° Specimen: Tracheal Aspirate; Respiratory  °Result Value Ref Range Status  ° Specimen Description TRACHEAL ASPIRATE  Final  ° Special Requests NONE  Final  ° Gram Stain   Final  °  FEW WBC PRESENT,BOTH PMN AND MONONUCLEAR °FEW GRAM POSITIVE COCCI IN PAIRS °FEW GRAM POSITIVE COCCI IN CHAINS °FEW GRAM NEGATIVE COCCI °FEW SQUAMOUS EPITHELIAL CELLS PRESENT °  ° Culture   Final  °  FEW KLEBSIELLA PNEUMONIAE °WITHIN NORMAL RESPIRATORY FLORA °Performed at [facility], [address] [place], [place] [address] °  ° Report Status 01/13/2022 FINAL  Final   ° Organism ID, Bacteria KLEBSIELLA PNEUMONIAE  Final  °    Susceptibility  ° Klebsiella pneumoniae - MIC*  °  AMPICILLIN >=32 RESISTANT Resistant   °  CEFAZOLIN <=4 SENSITIVE Sensitive   °  CEFEPIME <=0.12 SENSITIVE Sensitive   °  CEFTAZIDIME <=1 SENSITIVE Sensitive   °  CEFTRIAXONE <=0.25 SENSITIVE Sensitive   °  CIPROFLOXACIN <=0.25 SENSITIVE Sensitive   °  GENTAMICIN <=1 SENSITIVE Sensitive   °  IMIPENEM <=0.25 SENSITIVE Sensitive   °  TRIMETH/SULFA <=20 SENSITIVE Sensitive   °  AMPICILLIN/SULBACTAM 4 SENSITIVE Sensitive   °  PIP/TAZO <=4 SENSITIVE Sensitive   °  * FEW KLEBSIELLA PNEUMONIAE  °MRSA Next Gen by PCR, Nasal     Status: None  ° Collection Time: 01/11/22 12:29 PM  ° Specimen: Nasal Mucosa; Nasal Swab  °Result Value Ref Range Status  ° MRSA by PCR Next Gen NOT DETECTED NOT DETECTED Final  °  Comment: (NOTE) °The GeneXpert MRSA Assay (FDA approved for NASAL specimens only), °is one component of a comprehensive MRSA colonization surveillance °program. It is not intended to diagnose MRSA infection nor to guide °or monitor treatment for MRSA infections. °Test performance is not FDA approved in patients less than 2 years °old. °Performed at [facility], [address] [place], [place] °[address] °  °Culture, blood (routine x 2)     Status: None  ° Collection Time: 01/11/22  1:11 PM  ° Specimen: BLOOD  °Result Value Ref Range Status  ° Specimen Description BLOOD RIGHT ANTECUBITAL  Final  ° Special Requests   Final  °  BOTTLES DRAWN AEROBIC AND ANAEROBIC Blood Culture adequate volume  ° Culture   Final  °  NO GROWTH 5 DAYS °Performed at [facility], [address] [place], [place] [address] °  ° Report Status 01/16/2022 FINAL  Final  °  Culture, blood (routine x 2)     Status: None   Collection Time: 01/11/22  1:11 PM   Specimen: BLOOD  Result Value Ref Range Status   Specimen Description BLOOD RIGHT ANTECUBITAL  Final   Special Requests   Final    BOTTLES DRAWN AEROBIC ONLY Blood Culture  adequate volume   Culture   Final    NO GROWTH 5 DAYS Performed at [facility], [address], [place], [place] [address]    Report Status 01/16/2022 FINAL  Final         Radiology Studies: No results found.      Scheduled Meds:  carvedilol  25 mg Per Tube BID WC   chlorhexidine gluconate (MEDLINE KIT)  15 mL Mouth Rinse BID   Chlorhexidine Gluconate Cloth  6 each Topical Daily   collagenase   Topical BID   feeding supplement (PROSource TF)  45 mL Per Tube BID   folic acid  1 mg Per Tube Daily   heparin  5,000 Units Subcutaneous Q8H   levETIRAcetam  1,000 mg Per Tube Q12H   multivitamin with minerals  1 tablet Per Tube Daily   QUEtiapine  25 mg Oral QHS   thiamine injection  100 mg Intravenous Daily   Continuous Infusions:  cefTRIAXone (ROCEPHIN)  IV 2 g (01/16/22 1308)   feeding supplement (VITAL 1.5 CAL) 1,000 mL (01/15/22 1042)     LOS: 11 days       Phillips Climes, MD [facility]   To contact the attending provider between 7A-7P or the covering provider during after hours 7P-7A, please log into the web site [email or web address] and access using universal [place] password for that web site. If you do not have the password, please call the hospital operator.  01/16/2022, 1:09 PM

## 2022-01-17 ENCOUNTER — Inpatient Hospital Stay (HOSPITAL_COMMUNITY): Payer: Medicare Other

## 2022-01-17 LAB — GLUCOSE, CAPILLARY
Glucose-Capillary: 106 mg/dL — ABNORMAL HIGH (ref 70–99)
Glucose-Capillary: 111 mg/dL — ABNORMAL HIGH (ref 70–99)
Glucose-Capillary: 111 mg/dL — ABNORMAL HIGH (ref 70–99)
Glucose-Capillary: 123 mg/dL — ABNORMAL HIGH (ref 70–99)
Glucose-Capillary: 48 mg/dL — ABNORMAL LOW (ref 70–99)
Glucose-Capillary: 68 mg/dL — ABNORMAL LOW (ref 70–99)
Glucose-Capillary: 76 mg/dL (ref 70–99)
Glucose-Capillary: 80 mg/dL (ref 70–99)

## 2022-01-17 MED ORDER — DEXTROSE 50 % IV SOLN
INTRAVENOUS | Status: AC
  Filled 2022-01-17: qty 50

## 2022-01-17 MED ORDER — DEXTROSE 10 % IV SOLN: INTRAVENOUS | Status: DC

## 2022-01-17 MED ORDER — LOPERAMIDE HCL 2 MG PO CAPS
4.0000 mg | ORAL_CAPSULE | Freq: Two times a day (BID) | ORAL | Status: AC
  Administered 2022-01-17 (×2): 4 mg via ORAL
  Filled 2022-01-17 (×2): qty 2

## 2022-01-17 MED ORDER — DEXTROSE 50 % IV SOLN
25.0000 g | INTRAVENOUS | Status: AC
  Administered 2022-01-17: 25 g via INTRAVENOUS

## 2022-01-17 MED ORDER — ASPIRIN EC 81 MG PO TBEC
81.0000 mg | DELAYED_RELEASE_TABLET | Freq: Every day | ORAL | Status: DC
  Administered 2022-01-17: 81 mg via ORAL
  Filled 2022-01-17: qty 1

## 2022-01-17 NOTE — Progress Notes (Signed)
PROGRESS NOTE    Frederick [name]  OEV:[id number] DOB: 1961/05/01 DOA: 01/05/2022 PCP: [name]    Chief Complaint  Patient presents with   Seizures    Brief Narrative:  61 year old man who presented to [facility] ED 1/31 via EMS for seizures, concern for alcohol withdrawal. PMHx significant for HTN, asthma, tuberculosis (s/p treatment at [facility]), EtOH abuse, R finger osteomyelitis with strep bacteremia.  Of note, patient has seizure history with unclear medication compliance.   Per report, patient experienced several episodes of seizure activity at home.  EMS was called and patient was continuing to seize on arrival.  On arrival to ED, patient had ongoing focal left-sided seizure activity.  He was intubated for airway protection.  Keppra load was administered and EEG was obtained.  Neurology was consulted.  1/31 - BIB EMS after seizure at home, persistently seizing in ED with focal seizure activity of L hand. Difficult to sedate, history of EtOH abuse/withdrawal. Intubated for airway protection. C/f ongoing seizure, Neuro consult, EEG ordered. 2/1 - Burst suppression pattern on EEG 2/2 - Sedation weaned with no recurrence of clinical seizures.  2/3 - WUA/SBT, extubated 2/6 - New fever. Precedex weaned off.  2/8 -transferred to progressive unit under [facility]. 2/12 MRI brain showing small CVA Assessment & Plan:   Principal Problem:   Status epilepticus ([place]) Active Problems:   EtOH dependence ([place])   Cavitary lesion of lung   CKD (chronic kidney disease), stage III (HCC)   Protein-calorie malnutrition, severe   Acute metabolic encephalopathy    Status Epilepticus  Acute metabolic encephalopathy  History of Alcohol Abuse  Delirium -Seizure management per neurology, patient currently on Keppra 1000 mg p.o. twice daily, and as needed Ativan as well. -Seizure most likely provoked by having his noncompliance with medicine as his significant other  report he has not been compliant with his meds, reports last drink was 4 months ago, so unlikely the seizures related to alcohol withdrawal seizures.  But more likely due to medication noncompliance. -Continue seizure precautions, continue with delirium precaution as well -He is on CIWA protocol. -He remains with significant hospital delirium, he is gradually improving, more awake and interactive, continue with current dose Seroquel ,  as needed Ativan overnight.  . -He is still unable to pass his swallow evaluation, so we will continue with tube feed for now.   -Mentation has been gradually improving, he certainly had some hospital delirium which appears to be improving, continue with current dose Seroquel, avoid narcotics and benzo diazepam as possible, continue with delirium precaution.   Acute hypoxic respiratory failure 2/2 post ictal state COVID-19 infection (incidental) History of TB s/p treatment (2016)  History of Asthma  -Remains altered, but will encourage incentive spirometer and flutter valve when he is more awake and appropriate. -Continue to wean oxygen as tolerated.  Acute CVA -Patient mentation has been improving, he is more conversant and appropriate today, he appears to be with some slurred speech, left-sided coordination, MRI was obtained which did show evidence of left cerebellar infarct, neurology has been consulted.   Febrile [place] [place] pneumonia CXR obtained and unchanged to prior. Given increased respiratory rate, Zosyn started for potential aspiration pneumonia. Patient has severe dysphagia with multiple aspiration pneumonia incidences in the past. Expectorated sputum results pending. Blood cultures pending.  -Patient is treated with Zosyn, sputum culture growing Klebsiella, sensitive to Rocephin, will narrow antibiotics.  Sinus Tachycardia HR gradually increased since the evening of 2/6.  -Blood pressure  is elevated, as well heart rate is elevated, will increase  Coreg .   Hypertension  Blood pressure increased over the last 24 hours. Only BP medication at this time is low-dose Coreg.   -Blood pressure improved after increasing Coreg to 25 mg BID   Dysphagia  - Continue SLP evaluation and treatment - Nutrition via Tube Feeds    HFrEF  TEE in September 2022 with TEE demonstrating EF of 45%.    History of DVT  On Apixaban prior to arrival. Complated over 3 months of treatment. No indication for continued anti-coagulation  - DVT ppx only    GERD - PPI   Pressure ulcer : -Continue with wound care. Pressure Injury 01/13/22 Buttocks Mid Unstageable - Full thickness tissue loss in which the base of the injury is covered by slough (yellow, tan, gray, green or brown) and/or eschar (tan, brown or black) in the wound bed. yellow (Active)  01/13/22 2000  Location: Buttocks  Location Orientation: Mid  Staging: Unstageable - Full thickness tissue loss in which the base of the injury is covered by slough (yellow, tan, gray, green or brown) and/or eschar (tan, brown or black) in the wound bed.  Wound Description (Comments): yellow  Present on Admission: No        DVT prophylaxis: Heparin Code Status: Full Family Communication: None at bedside, discussed with significant other Katharine Look by phone per patient's request, they have been living together for more than 10 years. Disposition:   Status is: Inpatient Remains inpatient appropriate because: npo, on IV ABX and tube feed           Consultants:  PCCM Neurology   Subjective:  Patient himself denies any complaints, still having diarrhea on his Flexi-Seal as I have discussed with the nurse yesterday, otherwise no significant events.  .  Objective: Vitals:   01/17/22 0500 01/17/22 0800 01/17/22 1000 01/17/22 1400  BP:  128/81 (!) 154/92 129/85  Pulse:  94  82  Resp:  20 (!) 25 (!) 21  Temp:  97.7 F (36.5 C)  98 F (36.7 C)  TempSrc:  Oral  Oral  SpO2:      Weight: 79 kg      Height:        Intake/Output Summary (Last 24 hours) at 01/17/2022 1457 Last data filed at 01/17/2022 1200 Gross per 24 hour  Intake 220.28 ml  Output 800 ml  Net -579.72 ml   Filed Weights   01/13/22 0500 01/14/22 0500 01/17/22 0500  Weight: 77 kg 79.1 kg 79 kg    Examination:  Awake Alert, more appropriate and coherent today, but remains mildly confused.  He does follow commands and answer most questions appropriately. Symmetrical Chest wall movement, Good air movement bilaterally, CTAB RRR,No Gallops,Rubs or new Murmurs, No Parasternal Heave +ve B.Sounds, Abd Soft, No tenderness, No rebound - guarding or rigidity. No Cyanosis, Clubbing or edema, No new Rash or bruise , left's side poor coordination.    Data Reviewed: I have personally reviewed following labs and imaging studies  CBC: Recent Labs  Lab 01/11/22 0817 01/12/22 0513 01/14/22 0357 01/16/22 0039  WBC 6.0 9.1 6.2 7.8  NEUTROABS 3.6 6.0  --   --   HGB 11.6* 12.2* 12.1* 11.6*  HCT 36.8* 36.5* 37.9* 37.3*  MCV 85.4 84.1 85.0 86.3  PLT 167 181 194 935    Basic Metabolic Panel: Recent Labs  Lab 01/11/22 0817 01/11/22 1311 01/12/22 0513 01/12/22 1122 01/13/22 0609 01/13/22 1008 01/13/22 1426 01/14/22  4401 01/16/22 0039  NA 137  --  140  --   --   --   --  139 138  K 4.2  --  4.2  --   --   --   --  4.4 4.4  CL 102  --  102  --   --   --   --  104 104  CO2 22  --  29  --   --   --   --  27 26  GLUCOSE 127*   145*   < > 130*   128*   < > 130* 140* 130* 128* 132*  BUN 18  --  20  --   --   --   --  28* 33*  CREATININE 0.86  --  1.01  --   --   --   --  1.09 0.96  CALCIUM 8.4*  --  8.9  --   --   --   --  9.0 9.0  MG 1.6*  --  1.9  --   --   --   --   --   --    < > = values in this interval not displayed.    GFR: Estimated Creatinine Clearance: 90.3 mL/min (by C-G formula based on SCr of 0.96 mg/dL).  Liver Function Tests: Recent Labs  Lab 01/12/22 0513  AST 32  ALT 14  ALKPHOS 74   BILITOT 0.4  PROT 7.5  ALBUMIN 2.8*    CBG: Recent Labs  Lab 01/16/22 1236 01/16/22 1620 01/16/22 2000 01/17/22 0821 01/17/22 1225  GLUCAP 117* 122* 90 123* 68*     Recent Results (from the past 240 hour(s))  Culture, Respiratory w Gram Stain     Status: None   Collection Time: 01/11/22 10:26 AM   Specimen: Tracheal Aspirate; Respiratory  Result Value Ref Range Status   Specimen Description TRACHEAL ASPIRATE  Final   Special Requests NONE  Final   Gram Stain   Final    FEW WBC PRESENT,BOTH PMN AND MONONUCLEAR FEW GRAM POSITIVE COCCI IN PAIRS FEW GRAM POSITIVE COCCI IN CHAINS FEW GRAM NEGATIVE COCCI FEW SQUAMOUS EPITHELIAL CELLS PRESENT    Culture   Final    FEW KLEBSIELLA PNEUMONIAE WITHIN NORMAL RESPIRATORY FLORA Performed at [facility], [place] [address], [place], [place] [address]    Report Status 01/13/2022 FINAL  Final   Organism ID, Bacteria KLEBSIELLA PNEUMONIAE  Final      Susceptibility   Klebsiella pneumoniae - MIC*    AMPICILLIN >=32 RESISTANT Resistant     CEFAZOLIN <=4 SENSITIVE Sensitive     CEFEPIME <=0.12 SENSITIVE Sensitive     CEFTAZIDIME <=1 SENSITIVE Sensitive     CEFTRIAXONE <=0.25 SENSITIVE Sensitive     CIPROFLOXACIN <=0.25 SENSITIVE Sensitive     GENTAMICIN <=1 SENSITIVE Sensitive     IMIPENEM <=0.25 SENSITIVE Sensitive     TRIMETH/SULFA <=20 SENSITIVE Sensitive     AMPICILLIN/SULBACTAM 4 SENSITIVE Sensitive     PIP/TAZO <=4 SENSITIVE Sensitive     * FEW KLEBSIELLA PNEUMONIAE  MRSA Next Gen by PCR, Nasal     Status: None   Collection Time: 01/11/22 12:29 PM   Specimen: Nasal Mucosa; Nasal Swab  Result Value Ref Range Status   MRSA by PCR Next Gen NOT DETECTED NOT DETECTED Final    Comment: (NOTE) The GeneXpert MRSA Assay (FDA approved for NASAL specimens only), is one component of a comprehensive MRSA colonization surveillance program.  It is not intended to diagnose MRSA infection nor to guide or monitor treatment for MRSA  infections. Test performance is not FDA approved in patients less than 62 years old. Performed at [facility], [place] [address] [place], [place] [address]   Culture, blood (routine x 2)     Status: None   Collection Time: 01/11/22  1:11 PM   Specimen: BLOOD  Result Value Ref Range Status   Specimen Description BLOOD RIGHT ANTECUBITAL  Final   Special Requests   Final    BOTTLES DRAWN AEROBIC AND ANAEROBIC Blood Culture adequate volume   Culture   Final    NO GROWTH 5 DAYS Performed at [facility], [place] [address], [place], [place] [address]    Report Status 01/16/2022 FINAL  Final  Culture, blood (routine x 2)     Status: None   Collection Time: 01/11/22  1:11 PM   Specimen: BLOOD  Result Value Ref Range Status   Specimen Description BLOOD RIGHT ANTECUBITAL  Final   Special Requests   Final    BOTTLES DRAWN AEROBIC ONLY Blood Culture adequate volume   Culture   Final    NO GROWTH 5 DAYS Performed at [facility], [place] [address] [place], [place] [address]    Report Status 01/16/2022 FINAL  Final         Radiology Studies: DG Abd 1 View  Result Date: 01/17/2022 CLINICAL DATA:  MRI screening. EXAM: ABDOMEN - 1 VIEW COMPARISON:  01/08/2022 FINDINGS: Enteric tube has its metallic tip projecting in the distal stomach. Normal bowel gas pattern. Small densities noted in the right mid abdomen, suspected to be intrarenal stones. There are scattered aortic and pelvic atherosclerotic vascular calcifications. No metallic foreign body.  No findings to preclude MRI scanning. IMPRESSION: 1. No acute findings. 2. No metallic foreign body to preclude MRI scanning. Electronically Signed   By: Lajean Manes M.D.   On: 01/17/2022 14:00   MR BRAIN WO CONTRAST  Result Date: 01/17/2022 CLINICAL DATA:  Acute stroke suspected.  Left-sided weakness. EXAM: MRI HEAD WITHOUT CONTRAST TECHNIQUE: Multiplanar, multiecho pulse sequences of the brain and surrounding structures were obtained  without intravenous contrast. COMPARISON:  Head CT 01/05/2022 FINDINGS: Brain: Small acute infarct in the right cerebellum. Chronic lacune in the right centrum semiovale with central cystic component. Best seen on coronal diffusion acquisition there is a cluster of small weakly restricted areas in the right posterior cerebral white matter. No hemorrhage, hydrocephalus, or masslike finding. Negative for collection. Remote perforator infarct at the right basal ganglia. Mild ischemic gliosis in the periventricular white matter. Cerebral volume loss. Vascular: Preserved flow voids Skull and upper cervical spine: Normal marrow signal. Sinuses/Orbits: Patchy opacification of paranasal sinuses, overall mild. IMPRESSION: 1. Small acute infarct in the right cerebellum. 2. Cluster of subacute to chronic small infarcts in the right frontal parietal white matter. 3. Cerebral volume loss and chronic small vessel ischemia. Electronically Signed   By: Jorje Guild M.D.   On: 01/17/2022 12:17        Scheduled Meds:  carvedilol  25 mg Per Tube BID WC   chlorhexidine gluconate (MEDLINE KIT)  15 mL Mouth Rinse BID   collagenase   Topical BID   feeding supplement (PROSource TF)  45 mL Per Tube BID   folic acid  1 mg Per Tube Daily   heparin  5,000 Units Subcutaneous Q8H   levETIRAcetam  1,000 mg Per Tube Q12H   loperamide  4 mg Oral BID   multivitamin with minerals  1 tablet Per Tube Daily   nicotine  21 mg Transdermal Daily   QUEtiapine  25 mg Oral QHS   thiamine injection  100 mg Intravenous Daily   Continuous Infusions:  cefTRIAXone (ROCEPHIN)  IV Stopped (01/16/22 1344)   feeding supplement (VITAL 1.5 CAL) 1,000 mL (01/15/22 1042)     LOS: 12 days       Phillips Climes, MD [facility]   To contact the attending provider between 7A-7P or the covering provider during after hours 7P-7A, please log into the web site [email or web address] and access using universal [place] password for that web  site. If you do not have the password, please call the hospital operator.  01/17/2022, 2:57 PM

## 2022-01-17 NOTE — Progress Notes (Signed)
Neurology Progress Note  Brief HPI: 61 y.o. male who presented initially on 1/31 for status epilepticus in the setting of medication noncompliance (only taking BID Keppra once daily at home) and concern for alcohol withdrawal. Patient was witnessed with focal left-sided seizures on arrival and patient was ultimately intubated and sedated without seizure recurrence after extubation.  - Hospitalization has been complicated by fever, delirium, Klebsiella pneumonia, and MRI brain findings of acute stroke on 2/12 for which neurology was asked to reevaluate.   Subjective: No complaints of pain. There has been improvement in patient's mental status from last neurology evaluation.   Exam: Vitals:   01/17/22 1000 01/17/22 1400  BP: (!) 154/92 129/85  Pulse:  82  Resp: (!) 25 (!) 21  Temp:  98 F (36.7 C)  SpO2:     Gen: Sitting up in bed watching television. In no acute distress.  Resp: non-labored breathing, no respiratory distress Abd: soft, non-tender, non-distended  Neuro: Mental Status: Awake, alert to self. He states incorrectly that the year is 2003 and that the month is November. He correctly gives his age.  Speech is dysarthric and at times, unintelligible. When encouraged, he will speak with full fluent sentences in the context of his dysarthria.  He is able to name objects such as pinky, thumb and pen, as well as repeat phrases. He follows simple commands without difficulty.  No neglect is noted.  Cranial Nerves: PERRL, EOMI, visual fields are full, facial sensation is intact and symmetric to light touch, face is symmetric resting and smiling, hearing is intact to voice, shoulders shrug symmetrically, tongue is midline. Motor: Moves all extremities spontaneously and antigravity without vertical drift.  Movements are spastic with significant dyssinergia on attempting to touch an object with either hand, but worse on the left.  When testing strength against resistance, RUE is 4/5, LUE  4-/5, RLE 4+/5 and LLE 4/5.  Sensory: Intact and symmetric to light touch throughout DTR: 3+ and grossly symmetric throughout Coordination: There is some obvious dysmetria on the left > right upper extremity. Also with dysmetria on moving of BLE but less so than with upper extremities.  Gait: Deferred  Pertinent Labs: CBC    Component Value Date/Time   WBC 7.8 01/16/2022 0039   RBC 4.32 01/16/2022 0039   HGB 11.6 (L) 01/16/2022 0039   HCT 37.3 (L) 01/16/2022 0039   PLT 232 01/16/2022 0039   MCV 86.3 01/16/2022 0039   MCH 26.9 01/16/2022 0039   MCHC 31.1 01/16/2022 0039   RDW 16.0 (H) 01/16/2022 0039   LYMPHSABS 1.7 01/12/2022 0513   MONOABS 1.3 (H) 01/12/2022 0513   EOSABS 0.1 01/12/2022 0513   BASOSABS 0.0 01/12/2022 0513   CMP     Component Value Date/Time   NA 138 01/16/2022 0039   K 4.4 01/16/2022 0039   CL 104 01/16/2022 0039   CO2 26 01/16/2022 0039   GLUCOSE 132 (H) 01/16/2022 0039   BUN 33 (H) 01/16/2022 0039   CREATININE 0.96 01/16/2022 0039   CREATININE 0.74 10/27/2021 0000   CALCIUM 9.0 01/16/2022 0039   PROT 7.5 01/12/2022 0513   ALBUMIN 2.8 (L) 01/12/2022 0513   AST 32 01/12/2022 0513   ALT 14 01/12/2022 0513   ALKPHOS 74 01/12/2022 0513   BILITOT 0.4 01/12/2022 0513   GFRNONAA >60 01/16/2022 0039   GFRAA >60 04/27/2020 0009   Lab Results  Component Value Date   HGBA1C 5.5 01/05/2022   Lipid Panel     Component Value  Date/Time   CHOL 177 06/14/2008 2000   TRIG 92 01/06/2022 0718   HDL 82 06/14/2008 2000   CHOLHDL 2.2 Ratio 06/14/2008 2000   VLDL 8 06/14/2008 2000   LDLCALC 87 06/14/2008 2000   Imaging Reviewed:  MRI brain 2/12: 1. Small acute infarct in the right cerebellum. 2. Cluster of subacute to chronic small infarcts in the right frontal parietal white matter. 3. Cerebral volume loss and chronic small vessel ischemia.   Assessment: 61 y.o. male with initial hospitalization due to seizures and possible alcohol withdrawal.  Hospitalization complicated by intubation for airway protection, delirium, fever, and Klebsiella pneumonia. Due to dysarthria and left-sided incoordination, MRI brain was obtained revealing a small acute infarct in the right cerebellum and subacute to chronic small infarcts in the right frontal-parietal white matter. Of note, he has had several admissions for seizures due to combination of alcohol withdrawal and possible AED non-adherence.  - Regarding the acute and subacute strokes seen on MRI, the patient's last known well is unclear as he has previously been too delirious to perform cerebellar testing while inpatient.  - Stroke risk factors include patient's history of HTN and heavy alcohol abuse history.  - Most likely component of the DDx for the acute and subacute strokes includes cardioembolic (separate vascular territories), artery to artery embolization and vasculitic.  - Dysmetria on cerebellar testing. The small right cerebellar stroke is not large enough to fully account for this finding. DDx includes Wernicke's encephalopathy, however, he already has received moderate dose IV thiamine for 5 days from 2/3 to 2/7 (250 mg qd for 5 days with an additional dose on day one and two additional doses on day 4). 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 Given his cognitive impairment, an undiagnosed Korsakoff's syndrome is also a consideration.   Recommendations: - Lipid panel; statin for LDL goal of < 70 - Echocardiogram  - Frequent neuro checks - TTE. If negative, may need TEE.  - CT angio head and neck vessel imaging  - Prophylactic therapy- Antiplatelet med: Starting aspirin 81 mg PO daily  - Risk factor modification - Telemetry monitoring - PT consult, OT consult, Speech consult - May need hypercoagulable work up. Will defer to stroke team.  - Will need outpatient Neuropsychological testing to assess for possible Korsakoff syndrome - EtOH cessation counseling if he  is able to participate. If not able to participate, may need assistance from family in limiting his access to EtOH in the future. Likely will also need family or home health aide to assist with administration/compliance with medications on a regular basis.  - Stroke team to follow  Lanae Boast, AGACNP-BC [facility] [phone]  I have seen and examined the patient. I have reviewed the assessment and recommendations and made amendations as needed. 61 year old male with initial hospitalization due to seizures and possible alcohol withdrawal. Hospitalization complicated by intubation for airway protection, delirium, fever, and Klebsiella pneumonia. Due to dysarthria and left-sided incoordination, MRI brain was obtained revealing a small acute infarct in the right cerebellum and subacute to chronic small infarcts in the right frontal-parietal white matter. Exam reveals cognitive impairment, diffuse asymmetric weakness and dysmetria. Recommendations as above.   Electronically signed: Dr. Caryl Pina

## 2022-01-18 ENCOUNTER — Inpatient Hospital Stay (HOSPITAL_COMMUNITY): Payer: Medicare Other

## 2022-01-18 DIAGNOSIS — I6389 Other cerebral infarction: Secondary | ICD-10-CM | POA: Diagnosis not present

## 2022-01-18 DIAGNOSIS — I6330 Cerebral infarction due to thrombosis of unspecified cerebral artery: Secondary | ICD-10-CM | POA: Insufficient documentation

## 2022-01-18 LAB — BASIC METABOLIC PANEL
Anion gap: 9 (ref 5–15)
BUN: 25 mg/dL — ABNORMAL HIGH (ref 8–23)
CO2: 24 mmol/L (ref 22–32)
Calcium: 9.4 mg/dL (ref 8.9–10.3)
Chloride: 102 mmol/L (ref 98–111)
Creatinine, Ser: 0.82 mg/dL (ref 0.61–1.24)
GFR, Estimated: >60 mL/min (ref >60)
Glucose, Bld: 114 mg/dL — ABNORMAL HIGH (ref 70–99)
Potassium: 4.5 mmol/L (ref 3.5–5.1)
Sodium: 135 mmol/L (ref 135–145)

## 2022-01-18 LAB — GLUCOSE, CAPILLARY
Glucose-Capillary: 125 mg/dL — ABNORMAL HIGH (ref 70–99)
Glucose-Capillary: 116 mg/dL — ABNORMAL HIGH (ref 70–99)
Glucose-Capillary: 44 mg/dL — CL (ref 70–99)
Glucose-Capillary: 63 mg/dL — ABNORMAL LOW (ref 70–99)
Glucose-Capillary: 81 mg/dL (ref 70–99)
Glucose-Capillary: 44 mg/dL — CL (ref 70–99)
Glucose-Capillary: 81 mg/dL (ref 70–99)
Glucose-Capillary: 120 mg/dL — ABNORMAL HIGH (ref 70–99)
Glucose-Capillary: 77 mg/dL (ref 70–99)
Glucose-Capillary: 102 mg/dL — ABNORMAL HIGH (ref 70–99)
Glucose-Capillary: 14 mg/dL — CL (ref 70–99)
Glucose-Capillary: 35 mg/dL — CL (ref 70–99)
Glucose-Capillary: 95 mg/dL (ref 70–99)

## 2022-01-18 LAB — CBC
HCT: 39.4 % (ref 39.0–52.0)
Hemoglobin: 12.4 g/dL — ABNORMAL LOW (ref 13.0–17.0)
MCH: 27.4 pg (ref 26.0–34.0)
MCHC: 31.5 g/dL (ref 30.0–36.0)
MCV: 87 fL (ref 80.0–100.0)
Platelets: 331 10*3/uL (ref 150–400)
RBC: 4.53 MIL/uL (ref 4.22–5.81)
RDW: 16.2 % — ABNORMAL HIGH (ref 11.5–15.5)
WBC: 9.6 10*3/uL (ref 4.0–10.5)
nRBC: 0 % (ref 0.0–0.2)

## 2022-01-18 LAB — HEMOGLOBIN A1C
Hgb A1c MFr Bld: 5.9 % — ABNORMAL HIGH (ref 4.8–5.6)
Mean Plasma Glucose: 122.63 mg/dL

## 2022-01-18 LAB — ECHOCARDIOGRAM COMPLETE
AR max vel: 3.26 cm2
AV Area VTI: 3.77 cm2
AV Area mean vel: 3.28 cm2
AV Mean grad: 3 mmHg
AV Peak grad: 5.6 mmHg
Ao pk vel: 1.18 m/s
Area-P 1/2: 3.17 cm2
Calc EF: 56.2 %
Height: 73 in
S' Lateral: 3.3 cm
Single Plane A2C EF: 54.5 %
Single Plane A4C EF: 59.8 %
Weight: 2783.09 oz

## 2022-01-18 LAB — LIPID PANEL
Cholesterol: 160 mg/dL (ref 0–200)
HDL: 27 mg/dL — ABNORMAL LOW (ref >40)
LDL Cholesterol: 110 mg/dL — ABNORMAL HIGH (ref 0–99)
Total CHOL/HDL Ratio: 5.9 RATIO
Triglycerides: 114 mg/dL (ref <150)
VLDL: 23 mg/dL (ref 0–40)

## 2022-01-18 LAB — VITAMIN B12: Vitamin B-12: 319 pg/mL (ref 180–914)

## 2022-01-18 MED ORDER — ASPIRIN 81 MG PO CHEW
81.0000 mg | CHEWABLE_TABLET | Freq: Every day | ORAL | Status: DC
  Administered 2022-01-18 – 2022-01-22 (×5): 81 mg
  Filled 2022-01-18 (×5): qty 1

## 2022-01-18 MED ORDER — ADULT MULTIVITAMIN LIQUID CH
15.0000 mL | Freq: Every day | ORAL | Status: DC
  Administered 2022-01-18 – 2022-01-19 (×2): 15 mL
  Filled 2022-01-18 (×2): qty 15

## 2022-01-18 MED ORDER — QUETIAPINE FUMARATE 25 MG PO TABS
25.0000 mg | ORAL_TABLET | Freq: Every day | ORAL | Status: DC
  Administered 2022-01-18 – 2022-01-22 (×5): 25 mg
  Filled 2022-01-18 (×6): qty 1

## 2022-01-18 MED ORDER — IOHEXOL 350 MG/ML SOLN
75.0000 mL | Freq: Once | INTRAVENOUS | Status: AC | PRN
  Administered 2022-01-18: 75 mL via INTRAVENOUS

## 2022-01-18 MED ORDER — ATORVASTATIN CALCIUM 40 MG PO TABS
40.0000 mg | ORAL_TABLET | Freq: Every day | ORAL | Status: DC
Start: 2022-01-18 — End: 2022-01-23
  Administered 2022-01-18 – 2022-01-22 (×5): 40 mg via ORAL
  Filled 2022-01-18 (×5): qty 1

## 2022-01-18 MED ORDER — DEXTROSE 50 % IV SOLN: 12.5000 g | INTRAVENOUS | Status: AC

## 2022-01-18 MED ORDER — DEXTROSE 50 % IV SOLN
INTRAVENOUS | Status: AC
  Administered 2022-01-18: 12.5 g via INTRAVENOUS
  Filled 2022-01-18: qty 50

## 2022-01-18 MED ORDER — LOPERAMIDE HCL 2 MG PO CAPS
4.0000 mg | ORAL_CAPSULE | Freq: Two times a day (BID) | ORAL | Status: AC
  Administered 2022-01-18 – 2022-01-19 (×3): 4 mg via ORAL
  Filled 2022-01-18 (×3): qty 2

## 2022-01-18 MED ORDER — CILOSTAZOL 100 MG PO TABS
100.0000 mg | ORAL_TABLET | Freq: Two times a day (BID) | ORAL | Status: DC
  Administered 2022-01-18 – 2022-01-22 (×10): 100 mg via ORAL
  Filled 2022-01-18 (×10): qty 1

## 2022-01-18 NOTE — Care Management Important Message (Signed)
Important Message  Patient Details  Name: Frederick [name] MRN: [id number] Date of Birth: 03-29-1961   Medicare Important Message Given:        [name] 01/18/2022, 3:45 PM

## 2022-01-18 NOTE — Progress Notes (Signed)
°  Echocardiogram 2D Echocardiogram has been performed.  Frederick [name] 01/18/2022, 5:29 PM

## 2022-01-18 NOTE — TOC CAGE-AID Note (Signed)
Transition of Care [facility]) - CAGE-AID Screening   Patient Details  Name: Frederick [name] MRN: [id number] Date of Birth: 1961-11-15  Transition of [facility]) CM/SW Contact:    Lawan Nanez C Tarpley-Carter, LCSWA Phone Number: 01/18/2022, 1:59 PM   Clinical Narrative: Pt participated in Cage-Aid.  Pt stated he does not use substance or ETOH.  Pt was offered resources, due to usage of substance and ETOH in history.     Alisah Grandberry Tarpley-Carter, MSW, LCSW-A Pronouns:  She/Her/Hers [place] Transitions of Care Clinical Social Worker Direct Number:  [phone] [email or web address]  CAGE-AID Screening:    Have You Ever Felt You Ought to Cut Down on Your Drinking or Drug Use?: No Have People Annoyed You By [employer] Your Drinking Or Drug Use?: No Have You Felt Bad Or Guilty About Your Drinking Or Drug Use?: No Have You Ever Had a Drink or Used Drugs First Thing In The Morning to Steady Your Nerves or to Get Rid of a Hangover?: No CAGE-AID Score: 0  Substance Abuse Education Offered: Yes  Substance abuse interventions: Transport planner

## 2022-01-18 NOTE — Progress Notes (Signed)
Physical Therapy Treatment Patient Details Name: Frederick [name] MRN: [id number] DOB: [birth year] Today's Date: 01/18/2022   History of Present Illness 61 year old man who presented to [facility] ED 1/31 via EMS for seizures, concern for alcohol withdrawal. 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    PT Comments    Continuing work on functional mobility and activity tolerance;  Pt was focused on getting a pack of cigarettes, and  had difficulty participating in functional mobility activities; He did sit up in bed with PT; when it came time to try standing/transferring, pt became fixated on moving his bed against the wall in his room, and was unable to attend and follow through with cueing to work on moving and getting up;   VSS on room air; pt made many requests for bed positioning within the room; in an effort to show that we are listening, moved the bed up against the wall at his request (HOB near window, foot of bed near monitor on the wall); RN notified, and came in the room   Recommendations for follow up therapy are one component of a multi-disciplinary discharge planning process, led by the attending physician.  Recommendations may be updated based on patient status, additional functional criteria and insurance authorization.  Follow Up Recommendations  Skilled nursing-short term rehab (<3 hours/day)     Assistance Recommended at Discharge Frequent or constant Supervision/Assistance  Patient can return home with the following Two people to help with walking and/or transfers   Equipment Recommendations  Rolling walker (2 wheels);Wheelchair (measurements PT);Wheelchair cushion (measurements PT);Hospital bed     Recommendations for Other Services       Precautions / Restrictions Precautions Precautions: Fall Precaution Comments: Cortrak     Mobility  Bed Mobility Overal bed mobility: Needs Assistance Bed Mobility: Supine to Sit     Supine to sit: Min assist     General bed mobility comments: Min assist mostly for lines; Not getting up when PT made the request, but pulled himself up to sit with bedrail with R foot off of teh bed and L foot on the bed    Transfers                   General transfer comment: Pt declined getting up to stand walk or get OOB    Ambulation/Gait                   Stairs             Wheelchair Mobility    Modified Rankin (Stroke Patients Only) Modified Rankin (Stroke Patients Only) Pre-Morbid Rankin Score: Moderate disability Modified Rankin: Severe disability     Balance     Sitting balance-Leahy Scale: Fair                                      Cognition Arousal/Alertness: Awake/alert Behavior During Therapy: Impulsive, Restless (Seemed frustrated that his requests were not being met) Overall Cognitive Status: Impaired/Different from baseline Area of Impairment: Attention, Following commands, Orientation, Safety/judgement, Awareness, Problem solving                       Following Commands: Follows one  step commands inconsistently Safety/Judgement: Decreased awareness of deficits Awareness: Intellectual Problem Solving: Requires verbal cues, Requires tactile cues General Comments: Lines and leads seemed to bother pt at times, and he would become restless; seemed to get more restless/even a bit agitated that this PT could not say "yes" to his request to get him a pack of cigarettes; Wanted the hospital bed moved        Exercises      General Comments General comments (skin integrity, edema, etc.): VSS on room air; pt made many requests for bed positioning within the room; in an effort to  show that we are listening, moved the bed up against the wall at his request (HOB near window, foot of bed near monitor on the wall); RN notified, and came in the room      Pertinent Vitals/Pain Pain Assessment Pain Assessment: No/denies pain Pain Intervention(s): Monitored during session    Home Living                          Prior Function            PT Goals (current goals can now be found in the care plan section) Acute Rehab PT Goals Patient Stated Goal: Repeatdly asked to get some cigarettes; wanted his bed moved PT Goal Formulation: With patient Time For Goal Achievement: 01/24/22 Potential to Achieve Goals: Fair Progress towards PT goals: Not progressing toward goals - comment (decr ability to participate this session)    Frequency    Min 2X/week      PT Plan Current plan remains appropriate    Co-evaluation              AM-PAC PT "6 Clicks" Mobility   Outcome Measure  Help needed turning from your back to your side while in a flat bed without using bedrails?: A Lot Help needed moving from lying on your back to sitting on the side of a flat bed without using bedrails?: A Little Help needed moving to and from a bed to a chair (including a wheelchair)?: Total Help needed standing up from a chair using your arms (e.g., wheelchair or bedside chair)?: Total Help needed to walk in hospital room?: Total Help needed climbing 3-5 steps with a railing? : Total 6 Click Score: 9    End of Session   Activity Tolerance: Other (comment) (Limited ability to participate and follow commands today) Patient left: in bed;with nursing/sitter in room Nurse Communication: Other (comment) (Pt adamantly asking for his bed to be moved) PT Visit Diagnosis: Muscle weakness (generalized) (M62.81);Difficulty in walking, not elsewhere classified (R26.2)     Time: 1120-1140 PT Time Calculation (min) (ACUTE ONLY): 20 min  Charges:  $Therapeutic Activity: 8-22  mins                     Roney Marion, PT  Acute Rehabilitation Services Pager [phone] Office [place] 01/18/2022, 1:00 PM

## 2022-01-18 NOTE — Evaluation (Signed)
Speech Language Pathology Evaluation Patient Details Name: Frederick [name] MRN: [id number] DOB: Apr 21, 1961 Today's Date: 01/18/2022 Time: [id number] SLP Time Calculation (min) (ACUTE ONLY): 23 min  Problem List:  Patient Active Problem List   Diagnosis Date Noted   Cerebral thrombosis with cerebral infarction [id number]   Acute metabolic encephalopathy [id number]   Protein-calorie malnutrition, severe 01/07/2022   Streptococcal bacteremia 09/09/2021   Alcohol withdrawal seizure with complication, with unspecified complication ([place]) [id number]   CKD (chronic kidney disease), stage III ([place]) 08/19/2021   Dysphagia 08/19/2021   Malnutrition of moderate degree 08/12/2021   Bacteremia due to group B Streptococcus    Acute systolic CHF (congestive heart failure) ([place])    Seizures ([place]) 08/10/2021   Status epilepticus ([place])    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   Osteomyelitis of index finger of right hand ([place]) 05/28/2021   Marijuana abuse 07/22/2017   Hyponatremia 12/03/2015   Left rib fracture 11/29/2015   Microcytic anemia 11/29/2015   Cigarette smoker 11/29/2015   Moderate protein-calorie malnutrition ([place]) 11/29/2015   Unintentional weight loss 11/29/2015   History of tuberculosis 11/28/2015   Cavitary lesion of lung 11/28/2015   SBO (small bowel obstruction) ([place]) 03/10/2013   Abdominal pain, diffuse 03/10/2013   Nausea and vomiting 03/10/2013   Fracture of fibula, proximal 01/21/2013   Closed fracture of left distal distal tibia 01/21/2013   EtOH dependence ([place])    Fibula fracture    Hernia    Past Medical History:  Past Medical History:  Diagnosis Date   Arthritis    Asthma    Bilateral lower extremity edema 05/28/2021   propping feet up   Cough 05/28/2021   with clear sputum   Dyspnea    Dyspnea 05/28/2021   with exertion   EtOH dependence ([facility])    drinks 2-3 of 40ounes a day   Fibula fracture    Dr Mardelle Matte 02-2011   Finger  osteomyelitis, right ([place]) 05/26/2021   right index finger   Hernia    surgery 01-17-13   Hypertension    not taking bp meds since may 2022   Seizures ([place])    Streptococcal bacteremia 09/09/2021   Tuberculosis    couple of yrs ago took tx at [place] per pt on 05-28-2021   Past Surgical History:  Past Surgical History:  Procedure Laterality Date   AMPUTATION Right 05/29/2021   Procedure: PARTIAL RIGHT 2ND FINGER AMPUTATION;  Surgeon: Mcarthur Rossetti, MD;  Location: [place];  Service: Orthopedics;  Laterality: Right;   ANKLE SURGERY     INGUINAL HERNIA REPAIR Right 01/19/2013   Procedure: HERNIA REPAIR INGUINAL ADULT;  Surgeon: Gayland Curry, MD,FACS;  Location: [place];  Service: General;  Laterality: Right;   TEE WITHOUT CARDIOVERSION N/A 08/14/2021   Procedure: TRANSESOPHAGEAL ECHOCARDIOGRAM (TEE);  Surgeon: Josue Hector, MD;  Location[facility] END[facility]COPY;  Service: Cardiovascular;  Laterality: N/A;   TIBIA IM NAIL INSERTION Left 01/22/2013   Procedure: INTRAMEDULLARY (IM) NAIL TIBIAL;  Surgeon: Johnny Bridge, MD;  Location: [place];  Service: Orthopedics;  Laterality: Left;   HPI:  Pt is a 60 year old male who presented to [facility] ED 1/31 via for seizures, concern for alcohol withdrawal. On arrival to ED, patient had ongoing focal left-sided seizure activity. He was loaded with Keppra and intubated for airway protection. ETT 1/31-2/3. EEG 2/2: severe diffuse encephalopathy. No seizures or epileptiform discharges. Pt found to have COVID-19. MRI 2/12 positive for  small acute infarct in the right cerebellum as well as cluster of subacute to chronic small infarcts in the right frontal parietal white matter. PMH: HTN, asthma, tuberculosis (s/p treatment at [facility]), GERD, EtOH abuse, R finger osteomyelitis with strep bacteremia. Last seen by SLP September, 2022 with recommendation for regular texture solids and thin liquids   Assessment  / Plan / Recommendation Clinical Impression  Pt presents with a moderate dysarthria primarily impacted by reduced coordination for articulation. Pt did appear to have mildly reduced strength for labial closure as well during oral motor exam. Initially he had his significant other on the phone, and she reported having new difficulty understanding him over the phone compared to his baseline. He is disoriented to time and very internally distracted by his desire for cigarettes, making participation in structure tasks difficult. He attempted a delayed recall task but had trouble with immediate recall despite cueing, and recalled 0/4 words after brief delay. Pt would benefit from ongoing SLP f/u for cognition and communication.    SLP Assessment  SLP Recommendation/Assessment: Patient needs continued Speech Lanaguage Pathology Services SLP Visit Diagnosis: Dysarthria and anarthria (R47.1);Cognitive communication deficit (R41.841)    Recommendations for follow up therapy are one component of a multi-disciplinary discharge planning process, led by the attending physician.  Recommendations may be updated based on patient status, additional functional criteria and insurance authorization.    Follow Up Recommendations  Skilled nursing-short term rehab (<3 hours/day)    Assistance Recommended at Discharge  Frequent or constant Supervision/Assistance  Functional Status Assessment Patient has had a recent decline in their functional status and demonstrates the ability to make significant improvements in function in a reasonable and predictable amount of time.  Frequency and Duration min 2x/week  2 weeks      SLP Evaluation Cognition  Overall Cognitive Status: No family/caregiver present to determine baseline cognitive functioning Arousal/Alertness: Awake/alert Orientation Level: Oriented to person;Oriented to place;Oriented to situation;Disoriented to time Attention: Selective Selective Attention:  Impaired Selective Attention Impairment: Verbal basic Memory: Impaired Memory Impairment: Storage deficit;Retrieval deficit Awareness: Impaired Awareness Impairment: Emergent impairment;Anticipatory impairment Behaviors: Verbal agitation Safety/Judgment: Impaired       Comprehension  Auditory Comprehension Overall Auditory Comprehension: Appears within functional limits for tasks assessed (for simple, one-step commands)    Expression Expression Primary Mode of Expression: Verbal Verbal Expression Overall Verbal Expression: Appears within functional limits for tasks assessed   Oral / Motor  Motor Speech Overall Motor Speech: Impaired Respiration: Within functional limits Phonation: Normal Resonance: Within functional limits Articulation: Impaired Level of Impairment: Word Intelligibility: Intelligibility reduced Word: 75-100% accurate Phrase: 75-100% accurate Sentence: 50-74% accurate            Osie Bond., M.A. [facility] Acute Rehabilitation Services Pager [phone] Office [phone]  01/18/2022, 2:37 PM

## 2022-01-18 NOTE — Progress Notes (Signed)
0407: Called into pt's room regarding CBG of 63. 12.5 g of D50 given. 15-min recheck showed 44 but checked different finger and CBG showed 116. Pt remains asymptomatic. On-call Hospitalist notified about hypoglycemic event and extreme differences in CBGs. No new orders received.

## 2022-01-18 NOTE — Progress Notes (Addendum)
STROKE TEAM PROGRESS NOTE   INTERVAL HISTORY Patient is seen in his room with no family at the bedside.  He was admitted on 1/31 with seizures in the setting of noncompliance with his Keppra and concern for ETOH withdrawal.  Patient was intubated on arrival and extubated three days later.  MRI performed to investigate continued altered mental status demonstrated small acute right cerebellar stroke.  And subacute right corona radiator infarct.  CT angiogram shows no significant large vessel stenosis or occlusion.  Vitals:   01/18/22 0500 01/18/22 0759 01/18/22 0800 01/18/22 1150  BP:   (!) 152/95 127/83  Pulse:   87 82  Resp:   20 (!) 21  Temp:   (!) 97.4 F (36.3 C) 98.6 F (37 C)  TempSrc:  Oral    SpO2:      Weight: 78.9 kg     Height:       CBC:  Recent Labs  Lab 01/12/22 0513 01/14/22 0357 01/16/22 0039 01/18/22 0948  WBC 9.1   < > 7.8 9.6  NEUTROABS 6.0  --   --   --   HGB 12.2*   < > 11.6* 12.4*  HCT 36.5*   < > 37.3* 39.4  MCV 84.1   < > 86.3 87.0  PLT 181   < > 232 331   < > = values in this interval not displayed.   Basic Metabolic Panel:  Recent Labs  Lab 01/12/22 0513 01/12/22 1122 01/16/22 0039 01/18/22 0948  NA 140   < > 138 135  K 4.2   < > 4.4 4.5  CL 102   < > 104 102  CO2 29   < > 26 24  GLUCOSE 130*   128*   < > 132* 114*  BUN 20   < > 33* 25*  CREATININE 1.01   < > 0.96 0.82  CALCIUM 8.9   < > 9.0 9.4  MG 1.9  --   --   --    < > = values in this interval not displayed.   Lipid Panel:  Recent Labs  Lab 01/18/22 0948  CHOL 160  TRIG 114  HDL 27*  CHOLHDL 5.9  VLDL 23  LDLCALC 824*   HgbA1c:  Recent Labs  Lab 01/18/22 0948  HGBA1C 5.9*   Urine Drug Screen: No results for input(s): LABOPIA, COCAINSCRNUR, LABBENZ, AMPHETMU, THCU, LABBARB in the last 168 hours.  Alcohol Level No results for input(s): ETH in the last 168 hours.  IMAGING past 24 hours CT ANGIO HEAD NECK W WO CM  Result Date: 01/18/2022 CLINICAL DATA:  Stroke,  determine embolic source EXAM: CT ANGIOGRAPHY HEAD AND NECK TECHNIQUE: Multidetector CT imaging of the head and neck was performed using the standard protocol during bolus administration of intravenous contrast. Multiplanar CT image reconstructions and MIPs were obtained to evaluate the vascular anatomy. Carotid stenosis measurements (when applicable) are obtained utilizing NASCET criteria, using the distal internal carotid diameter as the denominator. RADIATION DOSE REDUCTION: This exam was performed according to the departmental dose-optimization program which includes automated exposure control, adjustment of the mA and/or kV according to patient size and/or use of iterative reconstruction technique. CONTRAST:  70mL OMNIPAQUE IOHEXOL 350 MG/ML SOLN COMPARISON:  Brain MRI from yesterday FINDINGS: CT HEAD FINDINGS Brain: Underestimated ischemia compared to prior brain MRI. No hemorrhage or visible progression. No hydrocephalus or collection. Chronic small vessel disease. Vascular: No hyperdense vessel or unexpected calcification. Skull: Normal. Negative for fracture or focal  lesion. Sinuses: Imaged portions are clear. Orbits: Negative Review of the MIP images confirms the above findings CTA NECK FINDINGS Aortic arch: Atheromatous plaque. Four vessel branching with both vertebral arteries arising from the arch, the right vertebral artery with a retroesophageal course. Right carotid system: Calcified plaque at the posterior wall of the ICA bulb. No stenosis or ulceration. Left carotid system: Mild atheromatous plaque on the proximal ICA without stenosis or ulceration. Vertebral arteries: No proximal subclavian stenosis. Both vertebral arteries arise from the arch. Motion artifact affects visualization of the proximal left vertebral artery. Calcified plaque at the right vertebral origin without stenosis. Both cervical vertebral arteries are smoothly contoured in the neck. Skeleton: Bulky spondylosis. Other neck: No  acute finding Upper chest: Right apical cavity, reference chest CT September 2022. Clustered nodularity at the left apex, inflammatory appearing and similar to prior. Review of the MIP images confirms the above findings CTA HEAD FINDINGS Anterior circulation: Atheromatous plaque on the carotid siphons with up to 50% narrowing at the right anterior genu. No branch occlusion or flow limiting stenosis, beading, or aneurysm. Posterior circulation: Codominant vertebral arteries. Proximal basilar fenestration. Vertebral and basilar arteries are smoothly contoured and diffusely patent. No PCA branch occlusion, beading, or aneurysm. Venous sinuses: Unremarkable Anatomic variants: As above Review of the MIP images confirms the above findings IMPRESSION: 1. No emergent finding. No flow limiting stenosis or embolic source to explain infarcts. 2. ~50% atheromatous narrowing at the right carotid siphon. Electronically Signed   By: Tiburcio Pea M.D.   On: 01/18/2022 11:18    PHYSICAL EXAM General:  Alert, thin-appearing African-American middle-aged male in no acute distress   NEURO:  Mental Status: AA&Ox3  Speech/Language: speech is with some dysarthria No aphasia.  Follows commands well.    Cranial Nerves:  II: PERRL.  III, IV, VI: EOMI. Eyelids elevate symmetrically.  V: Sensation is intact to light touch and symmetrical to face.  VII: Smile is symmetrical.  VIII: hearing intact to voice. IX, X:  Phonation is normal.  XII: tongue is midline without fasciculations. Motor: 5/5 strength to all muscle groups tested.  Sensation- Intact to light touch bilaterally.   Diminished fine finger movements on the left.  Orbits right over left upper extremity.  Trace left grip weakness. Coordination: FTN and HKS with some dysmetria, bilaterally fine motor movements impaired Gait- deferred   ASSESSMENT/PLAN Mr. BADER STUBBLEFIELD is a 61 y.o. male with history of seizures, ETOH abuse, CKD3 who was admitted on 1/31 with  seizures in the setting of noncompliance with his Keppra and possible ETOH withdrawal.  Patient was intubated on arrival and extubated three days later.  MRI performed to investigate continued altered mental status demonstrated small right cerebellar stroke.   Stroke:  right cerebellar infarct  likely secondary to small vessel disease.  Subacute right corona radiata lacunar infarct CT head No acute abnormality.  CTA head & neck no emergent findings, 50% atheromatous narrowing at right carotid siphon MRI  small acute right cerebellar infarct, chronic small infarcts in right frontal parietal white matter, cerebral volume loss and chronic small vessel ischemia 2D Echo pending LDL 110 HgbA1c 5.9 VTE prophylaxis - SQH    Diet   Diet NPO time specified   Eliquis (apixaban) daily prior to admission for DVT but completed 3 months of treatment no need for longer anticoagulation., now on aspirin 81 mg daily and cilostazol  Therapy recommendations:  SNF Disposition:  pending  Seizures Patient was admitted with status epilepticus in  setting of noncompliance with Keppra Continue Keppra 1000 mg q12h  ETOH abuse Patient has history of ETOH abuse Ataxia seen on exam may be patient's baseline Continue CIWA protocol  Hypertension Home meds:  none Stable Kepp BP <180/105 Long-term BP goal normotensive  Hyperlipidemia Home meds:  none LDL 110, goal < 70 Add atorvastatin 40 mg daily  Continue statin at discharge  Other Stroke Risk Factors Cigarette smoker advised to stop smoking ETOH use, alcohol level <10, advised not to drink  Other Active Problems COVID-19 infection Incidental finding, asymptomatic  Hospital day # 13  Cortney E Ernestina Columbia , MSN, AGACNP-BC [facility] See Amion for schedule and pager information 01/18/2022 2:57 PM  STROKE MD NOTE :  I have personally obtained history,examined this patient, reviewed notes, independently viewed imaging studies,  participated in medical decision making and plan of care.ROS completed by me personally and pertinent positives fully documented  I have made any additions or clarifications directly to the above note. Agree with note above.  Patient presented with altered mental status which was felt to be related to seizures from medication noncompliance but MRI scan shows a subacute right corona radiata as well as small acute right cerebellar lacunar infarcts likely from small vessel disease.  CT angiogram does not show significant large vessel disease and echocardiogram is pending.  Recommend aspirin and add Pletal 100 mg twice daily as recent evidence from the LACE 2 study presented at [facility] 2023 suggest benefit from it.  Continue ongoing stroke work-up and aggressive risk factor modifications.  Continue Keppra for seizure.  Patient counseled to be compliant with his medications.  Greater than 50% time during this 50-minute visit was spent in counseling and coordination of care about his lacunar strokes and seizures discussion about seizure and stroke prevention and answering questions.  Delia Heady, MD Medical Director [facility] Pager: [phone] 01/18/2022 3:51 PM     To contact Stroke Continuity provider, please refer to [email or web address]. After hours, contact General Neurology

## 2022-01-18 NOTE — Progress Notes (Addendum)
PROGRESS NOTE    Frederick [name]  ZOX:[id number] DOB: Nov 30, 1961 DOA: 01/05/2022 PCP: [name]    Chief Complaint  Patient presents with   Seizures    Brief Narrative:  61 year old man who presented to [facility] ED 1/31 via EMS for seizures, concern for alcohol withdrawal. PMHx significant for HTN, asthma, tuberculosis (s/p treatment at [facility]), EtOH abuse, R finger osteomyelitis with strep bacteremia.  Of note, patient has seizure history with unclear medication compliance.   Per report, patient experienced several episodes of seizure activity at home.  EMS was called and patient was continuing to seize on arrival.  On arrival to ED, patient had ongoing focal left-sided seizure activity.  He was intubated for airway protection.  Keppra load was administered and EEG was obtained.  Neurology was consulted.  1/31 - BIB EMS after seizure at home, persistently seizing in ED with focal seizure activity of L hand. Difficult to sedate, history of EtOH abuse/withdrawal. Intubated for airway protection. C/f ongoing seizure, Neuro consult, EEG ordered. 2/1 - Burst suppression pattern on EEG 2/2 - Sedation weaned with no recurrence of clinical seizures.  2/3 - WUA/SBT, extubated 2/6 - New fever. Precedex weaned off.  2/8 -transferred to progressive unit under [facility]. 2/12 MRI brain showing small CVA  Assessment & Plan:   Principal Problem:   Status epilepticus ([place]) Active Problems:   EtOH dependence ([place])   Cavitary lesion of lung   CKD (chronic kidney disease), stage III (HCC)   Protein-calorie malnutrition, severe   Acute metabolic encephalopathy   Cerebral thrombosis with cerebral infarction    Status Epilepticus  Acute metabolic encephalopathy  History of Alcohol Abuse  Delirium -Seizure management per neurology, patient currently on Keppra 1000 mg p.o. twice daily, and as needed Ativan as well. -Seizure most likely provoked by having his  noncompliance with medicine as his significant other report he has not been compliant with his meds, reports last drink was 4 months ago, so unlikely the seizures related to alcohol withdrawal seizures.  But more likely due to medication noncompliance. -Continue seizure precautions, continue with delirium precaution as well -He is on CIWA protocol. -He remains with significant hospital delirium, he is gradually improving, more awake and interactive, continue with current dose Seroquel ,  as needed Ativan overnight.  . -He is still unable to pass his swallow evaluation, so we will continue with tube feed for now.   -Mentation has been gradually improving, he certainly had some hospital delirium which appears to be improving, continue with current dose Seroquel, avoid narcotics and benzo diazepam as possible, continue with delirium precaution. -Now as well he is with diagnosis of acute CVA, this is likely contributing to his encephalopathy. - check B12 level.   Acute hypoxic respiratory failure 2/2 post ictal state COVID-19 infection (incidental) History of TB s/p treatment (2016)  History of Asthma  -Remains altered, but will encourage incentive spirometer and flutter valve when he is more awake and appropriate. -Continue to wean oxygen as tolerated.  Acute CVA -Patient mentation has been improving and he is more cooperative with examination, he was noted to have some focal deficits, MRI on 2/12 significant for left cerebellar infarct, neurology has been consulted. -Management per neurology, started on aspirin and Pletal   Febrile /Klebsiella pneumonia CXR obtained and unchanged to prior. Given increased respiratory rate, Zosyn started for potential aspiration pneumonia. Patient has severe dysphagia with multiple aspiration pneumonia incidences in the past. Expectorated sputum results pending. Blood  cultures pending.  -Patient is treated with Zosyn, sputum culture growing Klebsiella, sensitive  to Rocephin, will narrow antibiotics.  Sinus Tachycardia HR gradually increased since the evening of 2/6.  -Blood pressure is elevated, as well heart rate is elevated, will increase Coreg .   Hypertension  Blood pressure increased over the last 24 hours. Only BP medication at this time is low-dose Coreg.   -Blood pressure improved after increasing Coreg to 25 mg BID   Dysphagia  - Continue SLP evaluation and treatment - Nutrition via Tube Feeds    HFrEF  TEE in September 2022 with TEE demonstrating EF of 45%.    History of DVT  On Apixaban prior to arrival. Complated over 3 months of treatment. No indication for continued anti-coagulation  - DVT ppx only    GERD - PPI   Pressure ulcer : -Continue with wound care. Pressure Injury 01/13/22 Buttocks Mid Unstageable - Full thickness tissue loss in which the base of the injury is covered by slough (yellow, tan, gray, green or brown) and/or eschar (tan, brown or black) in the wound bed. yellow (Active)  01/13/22 2000  Location: Buttocks  Location Orientation: Mid  Staging: Unstageable - Full thickness tissue loss in which the base of the injury is covered by slough (yellow, tan, gray, green or brown) and/or eschar (tan, brown or black) in the wound bed.  Wound Description (Comments): yellow  Present on Admission: No    Hypoglycemia -This is most likely due to erroneous readings from his fingers, he started on D10, will continue to monitor closely.  Flexi-Seal was discontinued yesterday, will continue with Imodium for next 24 hours when he still having some diarrhea.    DVT prophylaxis: Heparin Code Status: Full Family Communication: None at bedside, discussed with significant other Katharine Look on 12/12 by phone per patient's request, they have been living together for more than 10 years. Disposition:   Status is: Inpatient Remains inpatient appropriate because: Acute CVA work-up          Consultants:   PCCM Neurology   Subjective:  Patient himself denies any complaints, leg [name] was discontinued yesterday.  Objective: Vitals:   01/18/22 0500 01/18/22 0759 01/18/22 0800 01/18/22 1150  BP:   (!) 152/95 127/83  Pulse:   87 82  Resp:   20 (!) 21  Temp:   (!) 97.4 F (36.3 C) 98.6 F (37 C)  TempSrc:  Oral    SpO2:      Weight: 78.9 kg     Height:        Intake/Output Summary (Last 24 hours) at 01/18/2022 1424 Last data filed at 01/18/2022 1100 Gross per 24 hour  Intake 670.54 ml  Output 1450 ml  Net -779.46 ml   Filed Weights   01/14/22 0500 01/17/22 0500 01/18/22 0500  Weight: 79.1 kg 79 kg 78.9 kg    Examination:  Awake Alert, conversant, follow commands and answering most of questions appropriately, he has mild dysarthria.   Symmetrical Chest wall movement, Good air movement bilaterally, CTAB RRR,No Gallops,Rubs or new Murmurs, No Parasternal Heave +ve B.Sounds, Abd Soft, No tenderness, No rebound - guarding or rigidity. No Cyanosis, Clubbing or edema, No new Rash or bruise      Data Reviewed: I have personally reviewed following labs and imaging studies  CBC: Recent Labs  Lab 01/12/22 0513 01/14/22 0357 01/16/22 0039 01/18/22 0948  WBC 9.1 6.2 7.8 9.6  NEUTROABS 6.0  --   --   --  HGB 12.2* 12.1* 11.6* 12.4*  HCT 36.5* 37.9* 37.3* 39.4  MCV 84.1 85.0 86.3 87.0  PLT 181 194 232 462    Basic Metabolic Panel: Recent Labs  Lab 01/12/22 0513 01/12/22 1122 01/13/22 1008 01/13/22 1426 01/14/22 0357 01/16/22 0039 01/18/22 0948  NA 140  --   --   --  139 138 135  K 4.2  --   --   --  4.4 4.4 4.5  CL 102  --   --   --  104 104 102  CO2 29  --   --   --  _0 GLUCOSE 130*   128*   < > 140* 130* 128* 132* 114*  BUN 20  --   --   --  28* 33* 25*  CREATININE 1.01  --   --   --  1.09 0.96 0.82  CALCIUM 8.9  --   --   --  9.0 9.0 9.4  MG 1.9  --   --   --   --   --   --    < > = values in this interval not displayed.    GFR: Estimated  Creatinine Clearance: 105.6 mL/min (by C-G formula based on SCr of 0.82 mg/dL).  Liver Function Tests: Recent Labs  Lab 01/12/22 0513  AST 32  ALT 14  ALKPHOS 74  BILITOT 0.4  PROT 7.5  ALBUMIN 2.8*    CBG: Recent Labs  Lab 01/18/22 0638 01/18/22 0816 01/18/22 0818 01/18/22 0935 01/18/22 1151  GLUCAP 81 44* 81 120* 77     Recent Results (from the past 240 hour(s))  Culture, Respiratory w Gram Stain     Status: None   Collection Time: 01/11/22 10:26 AM   Specimen: Tracheal Aspirate; Respiratory  Result Value Ref Range Status   Specimen Description TRACHEAL ASPIRATE  Final   Special Requests NONE  Final   Gram Stain   Final    FEW WBC PRESENT,BOTH PMN AND MONONUCLEAR FEW GRAM POSITIVE COCCI IN PAIRS FEW GRAM POSITIVE COCCI IN CHAINS FEW GRAM NEGATIVE COCCI FEW SQUAMOUS EPITHELIAL CELLS PRESENT    Culture   Final    FEW KLEBSIELLA PNEUMONIAE WITHIN NORMAL RESPIRATORY FLORA Performed at [facility], [place] [address], [place], [place] [address]    Report Status 01/13/2022 FINAL  Final   Organism ID, Bacteria KLEBSIELLA PNEUMONIAE  Final      Susceptibility   Klebsiella pneumoniae - MIC*    AMPICILLIN >=32 RESISTANT Resistant     CEFAZOLIN <=4 SENSITIVE Sensitive     CEFEPIME <=0.12 SENSITIVE Sensitive     CEFTAZIDIME <=1 SENSITIVE Sensitive     CEFTRIAXONE <=0.25 SENSITIVE Sensitive     CIPROFLOXACIN <=0.25 SENSITIVE Sensitive     GENTAMICIN <=1 SENSITIVE Sensitive     IMIPENEM <=0.25 SENSITIVE Sensitive     TRIMETH/SULFA <=20 SENSITIVE Sensitive     AMPICILLIN/SULBACTAM 4 SENSITIVE Sensitive     PIP/TAZO <=4 SENSITIVE Sensitive     * FEW KLEBSIELLA PNEUMONIAE  MRSA Next Gen by PCR, Nasal     Status: None   Collection Time: 01/11/22 12:29 PM   Specimen: Nasal Mucosa; Nasal Swab  Result Value Ref Range Status   MRSA by PCR Next Gen NOT DETECTED NOT DETECTED Final    Comment: (NOTE) The GeneXpert MRSA Assay (FDA approved for NASAL specimens only), is  one component of a comprehensive MRSA colonization surveillance program. It is not intended to diagnose MRSA infection nor  to guide or monitor treatment for MRSA infections. Test performance is not FDA approved in patients less than 22 years old. Performed at [facility], [place] [address], [place], [place] [address]   Culture, blood (routine x 2)     Status: None   Collection Time: 01/11/22  1:11 PM   Specimen: BLOOD  Result Value Ref Range Status   Specimen Description BLOOD RIGHT ANTECUBITAL  Final   Special Requests   Final    BOTTLES DRAWN AEROBIC AND ANAEROBIC Blood Culture adequate volume   Culture   Final    NO GROWTH 5 DAYS Performed at [facility], [place] [address] [place], [place] [address]    Report Status 01/16/2022 FINAL  Final  Culture, blood (routine x 2)     Status: None   Collection Time: 01/11/22  1:11 PM   Specimen: BLOOD  Result Value Ref Range Status   Specimen Description BLOOD RIGHT ANTECUBITAL  Final   Special Requests   Final    BOTTLES DRAWN AEROBIC ONLY Blood Culture adequate volume   Culture   Final    NO GROWTH 5 DAYS Performed at [facility], [place] [address], [place], [place] [address]    Report Status 01/16/2022 FINAL  Final         Radiology Studies: CT ANGIO HEAD NECK W WO CM  Result Date: 01/18/2022 CLINICAL DATA:  Stroke, determine embolic source EXAM: CT ANGIOGRAPHY HEAD AND NECK TECHNIQUE: Multidetector CT imaging of the head and neck was performed using the standard protocol during bolus administration of intravenous contrast. Multiplanar CT image reconstructions and MIPs were obtained to evaluate the vascular anatomy. Carotid stenosis measurements (when applicable) are obtained utilizing NASCET criteria, using the distal internal carotid diameter as the denominator. RADIATION DOSE REDUCTION: This exam was performed according to the departmental dose-optimization program which includes automated exposure control,  adjustment of the mA and/or kV according to patient size and/or use of iterative reconstruction technique. CONTRAST:  74m OMNIPAQUE IOHEXOL 350 MG/ML SOLN COMPARISON:  Brain MRI from yesterday FINDINGS: CT HEAD FINDINGS Brain: Underestimated ischemia compared to prior brain MRI. No hemorrhage or visible progression. No hydrocephalus or collection. Chronic small vessel disease. Vascular: No hyperdense vessel or unexpected calcification. Skull: Normal. Negative for fracture or focal lesion. Sinuses: Imaged portions are clear. Orbits: Negative Review of the MIP images confirms the above findings CTA NECK FINDINGS Aortic arch: Atheromatous plaque. Four vessel branching with both vertebral arteries arising from the arch, the right vertebral artery with a retroesophageal course. Right carotid system: Calcified plaque at the posterior wall of the ICA bulb. No stenosis or ulceration. Left carotid system: Mild atheromatous plaque on the proximal ICA without stenosis or ulceration. Vertebral arteries: No proximal subclavian stenosis. Both vertebral arteries arise from the arch. Motion artifact affects visualization of the proximal left vertebral artery. Calcified plaque at the right vertebral origin without stenosis. Both cervical vertebral arteries are smoothly contoured in the neck. Skeleton: Bulky spondylosis. Other neck: No acute finding Upper chest: Right apical cavity, reference chest CT September 2022. Clustered nodularity at the left apex, inflammatory appearing and similar to prior. Review of the MIP images confirms the above findings CTA HEAD FINDINGS Anterior circulation: Atheromatous plaque on the carotid siphons with up to 50% narrowing at the right anterior genu. No branch occlusion or flow limiting stenosis, beading, or aneurysm. Posterior circulation: Codominant vertebral arteries. Proximal basilar fenestration. Vertebral and basilar arteries are smoothly contoured and diffusely patent. No  PCA branch  occlusion, beading, or aneurysm. Venous sinuses: Unremarkable Anatomic variants: As above Review of the MIP images confirms the above findings IMPRESSION: 1. No emergent finding. No flow limiting stenosis or embolic source to explain infarcts. 2. ~50% atheromatous narrowing at the right carotid siphon. Electronically Signed   By: Jorje Guild M.D.   On: 01/18/2022 11:18   DG Abd 1 View  Result Date: 01/17/2022 CLINICAL DATA:  MRI screening. EXAM: ABDOMEN - 1 VIEW COMPARISON:  01/08/2022 FINDINGS: Enteric tube has its metallic tip projecting in the distal stomach. Normal bowel gas pattern. Small densities noted in the right mid abdomen, suspected to be intrarenal stones. There are scattered aortic and pelvic atherosclerotic vascular calcifications. No metallic foreign body.  No findings to preclude MRI scanning. IMPRESSION: 1. No acute findings. 2. No metallic foreign body to preclude MRI scanning. Electronically Signed   By: Lajean Manes M.D.   On: 01/17/2022 14:00   MR BRAIN WO CONTRAST  Result Date: 01/17/2022 CLINICAL DATA:  Acute stroke suspected.  Left-sided weakness. EXAM: MRI HEAD WITHOUT CONTRAST TECHNIQUE: Multiplanar, multiecho pulse sequences of the brain and surrounding structures were obtained without intravenous contrast. COMPARISON:  Head CT 01/05/2022 FINDINGS: Brain: Small acute infarct in the right cerebellum. Chronic lacune in the right centrum semiovale with central cystic component. Best seen on coronal diffusion acquisition there is a cluster of small weakly restricted areas in the right posterior cerebral white matter. No hemorrhage, hydrocephalus, or masslike finding. Negative for collection. Remote perforator infarct at the right basal ganglia. Mild ischemic gliosis in the periventricular white matter. Cerebral volume loss. Vascular: Preserved flow voids Skull and upper cervical spine: Normal marrow signal. Sinuses/Orbits: Patchy opacification of paranasal sinuses, overall mild.  IMPRESSION: 1. Small acute infarct in the right cerebellum. 2. Cluster of subacute to chronic small infarcts in the right frontal parietal white matter. 3. Cerebral volume loss and chronic small vessel ischemia. Electronically Signed   By: Jorje Guild M.D.   On: 01/17/2022 12:17        Scheduled Meds:  aspirin  81 mg Per Tube Daily   carvedilol  25 mg Per Tube BID WC   chlorhexidine gluconate (MEDLINE KIT)  15 mL Mouth Rinse BID   cilostazol  100 mg Oral BID   collagenase   Topical BID   feeding supplement (PROSource TF)  45 mL Per Tube BID   folic acid  1 mg Per Tube Daily   heparin  5,000 Units Subcutaneous Q8H   levETIRAcetam  1,000 mg Per Tube Q12H   loperamide  4 mg Oral BID   multivitamin  15 mL Per Tube Daily   nicotine  21 mg Transdermal Daily   QUEtiapine  25 mg Per Tube QHS   thiamine injection  100 mg Intravenous Daily   Continuous Infusions:  cefTRIAXone (ROCEPHIN)  IV 2 g (01/17/22 1521)   dextrose 40 mL/hr at 01/18/22 0815   feeding supplement (VITAL 1.5 CAL) 1,000 mL (01/18/22 0203)     LOS: 13 days       Phillips Climes, MD [facility]   To contact the attending provider between 7A-7P or the covering provider during after hours 7P-7A, please log into the web site [email or web address] and access using universal [place] password for that web site. If you do not have the password, please call the hospital operator.  01/18/2022, 2:24 PM

## 2022-01-18 NOTE — Progress Notes (Signed)
Speech Language Pathology Treatment: Dysphagia  Patient Details Name: Frederick [name] MRN: [id number] DOB: Sep 22, 1961 Today's Date: 01/18/2022 Time: 1213-1225 SLP Time Calculation (min) (ACUTE ONLY): 12 min  Assessment / Plan / Recommendation Clinical Impression  Pt is very alert today and although his speech is slurred, he has a less wet sounding vocal quality. He does not have the overt s/s of aspiration with PO trials that he has been having, but s/s of dysphagia persist. He has multiple swallows per bolus, at times using upwards of 5 swallows per sip of water. Would proceed with instrumental testing to better evaluate oropharyngeal swallow prior to starting PO diet. This can be completed on next date at the earliest, so for now would continue to keep him NPO except for a few ice chips or small sips of water at a time after oral care.    HPI HPI: Pt is a 61 year old male who presented to [facility] ED 1/31 via for seizures, concern for alcohol withdrawal. On arrival to ED, patient had ongoing focal left-sided seizure activity. He was loaded with Keppra and intubated for airway protection. ETT 1/31-2/3. EEG 2/2: severe diffuse encephalopathy. No seizures or epileptiform discharges. Pt found to have COVID-19. 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 PMH: HTN, asthma, tuberculosis (s/p treatment at [facility]), GERD, EtOH abuse, R finger osteomyelitis with strep bacteremia. Last seen by SLP September, 2022 with recommendation for regular texture solids and thin liquids      SLP Plan  MBS      Recommendations for follow up therapy are one component of a multi-disciplinary discharge planning process, led by the attending physician.  Recommendations may be updated based on patient status, additional functional criteria and insurance authorization.    Recommendations   Diet recommendations: NPO (ice chips, sips water after oral care) Medication Administration: Via alternative means (could offer crushed in puree if pt loses alternative access) Supervision: Full supervision/cueing for compensatory strategies                Oral Care Recommendations: Oral care QID;Oral care prior to ice chip/H20 Follow Up Recommendations: Skilled nursing-short term rehab (<3 hours/day) Assistance recommended at discharge: Frequent or constant Supervision/Assistance SLP Visit Diagnosis: Dysphagia, unspecified (R13.10) Plan: MBS           Mahala Menghini., M.A. CCC-SLP Acute Rehabilitation Services Pager [phone] Office [phone]  01/18/2022, 2:04 PM

## 2022-01-19 ENCOUNTER — Inpatient Hospital Stay (HOSPITAL_COMMUNITY): Payer: Medicare Other

## 2022-01-19 LAB — BASIC METABOLIC PANEL
Anion gap: 11 (ref 5–15)
BUN: 22 mg/dL (ref 8–23)
CO2: 23 mmol/L (ref 22–32)
Calcium: 9.3 mg/dL (ref 8.9–10.3)
Chloride: 98 mmol/L (ref 98–111)
Creatinine, Ser: 0.8 mg/dL (ref 0.61–1.24)
GFR, Estimated: >60 mL/min (ref >60)
Glucose, Bld: 98 mg/dL (ref 70–99)
Potassium: 5.5 mmol/L — ABNORMAL HIGH (ref 3.5–5.1)
Sodium: 132 mmol/L — ABNORMAL LOW (ref 135–145)

## 2022-01-19 LAB — POTASSIUM: Potassium: 4.3 mmol/L (ref 3.5–5.1)

## 2022-01-19 LAB — GLUCOSE, CAPILLARY
Glucose-Capillary: 103 mg/dL — ABNORMAL HIGH (ref 70–99)
Glucose-Capillary: 109 mg/dL — ABNORMAL HIGH (ref 70–99)
Glucose-Capillary: 114 mg/dL — ABNORMAL HIGH (ref 70–99)
Glucose-Capillary: 124 mg/dL — ABNORMAL HIGH (ref 70–99)
Glucose-Capillary: 124 mg/dL — ABNORMAL HIGH (ref 70–99)
Glucose-Capillary: 125 mg/dL — ABNORMAL HIGH (ref 70–99)
Glucose-Capillary: 126 mg/dL — ABNORMAL HIGH (ref 70–99)
Glucose-Capillary: 51 mg/dL — ABNORMAL LOW (ref 70–99)

## 2022-01-19 LAB — CBC
HCT: 38.2 % — ABNORMAL LOW (ref 39.0–52.0)
Hemoglobin: 11.8 g/dL — ABNORMAL LOW (ref 13.0–17.0)
MCH: 27.1 pg (ref 26.0–34.0)
MCHC: 30.9 g/dL (ref 30.0–36.0)
MCV: 87.8 fL (ref 80.0–100.0)
Platelets: 322 10*3/uL (ref 150–400)
RBC: 4.35 MIL/uL (ref 4.22–5.81)
RDW: 16.3 % — ABNORMAL HIGH (ref 11.5–15.5)
WBC: 10.7 10*3/uL — ABNORMAL HIGH (ref 4.0–10.5)
nRBC: 0 % (ref 0.0–0.2)

## 2022-01-19 MED ORDER — CYANOCOBALAMIN 1000 MCG/ML IJ SOLN: 1000.0000 ug | INTRAMUSCULAR | Status: DC

## 2022-01-19 MED ORDER — THIAMINE HCL 100 MG PO TABS
100.0000 mg | ORAL_TABLET | Freq: Every day | ORAL | Status: DC
  Administered 2022-01-20 – 2022-01-22 (×3): 100 mg
  Filled 2022-01-19 (×3): qty 1

## 2022-01-19 MED ORDER — ADULT MULTIVITAMIN W/MINERALS CH
1.0000 | ORAL_TABLET | Freq: Every day | ORAL | Status: DC
  Administered 2022-01-19 – 2022-01-22 (×4): 1 via ORAL
  Filled 2022-01-19 (×4): qty 1

## 2022-01-19 MED ORDER — CYANOCOBALAMIN 1000 MCG/ML IJ SOLN
1000.0000 ug | Freq: Every day | INTRAMUSCULAR | Status: DC
  Administered 2022-01-19 – 2022-01-22 (×4): 1000 ug via INTRAMUSCULAR
  Filled 2022-01-19 (×4): qty 1

## 2022-01-19 NOTE — Progress Notes (Signed)
STROKE TEAM PROGRESS NOTE   INTERVAL HISTORY Patient is seen in his room with no family at the bedside.  He states he wants to smoke and go out and wants to leave.  He is not very cooperative for exam.  Vital signs are stable.  Neurological exam appears unchanged though is limited.  2D echo shows ejection fraction 55 to 60%.  No definite cardiac source of embolism.  Vitals:   01/19/22 0044 01/19/22 0400 01/19/22 0510 01/19/22 0801  BP: (!) 149/83  (!) 131/92 122/80  Pulse: 77 95 89 86  Resp: (!) 24  15 20   Temp: (!) 97.3 F (36.3 C)  97.8 F (36.6 C) 97.6 F (36.4 C)  TempSrc: Axillary  Oral Oral  SpO2: 95%  97% 97%  Weight:      Height:       CBC:  Recent Labs  Lab 01/18/22 0948 01/19/22 0100  WBC 9.6 10.7*  HGB 12.4* 11.8*  HCT 39.4 38.2*  MCV 87.0 87.8  PLT 331 322   Basic Metabolic Panel:  Recent Labs  Lab 01/18/22 0948 01/19/22 0100 01/19/22 0808  NA 135 132*  --   K 4.5 5.5* 4.3  CL 102 98  --   CO2 24 23  --   GLUCOSE 114* 98  --   BUN 25* 22  --   CREATININE 0.82 0.80  --   CALCIUM 9.4 9.3  --    Lipid Panel:  Recent Labs  Lab 01/18/22 0948  CHOL 160  TRIG 114  HDL 27*  CHOLHDL 5.9  VLDL 23  LDLCALC 01/20/22*   HgbA1c:  Recent Labs  Lab 01/18/22 0948  HGBA1C 5.9*   Urine Drug Screen: No results for input(s): LABOPIA, COCAINSCRNUR, LABBENZ, AMPHETMU, THCU, LABBARB in the last 168 hours.  Alcohol Level No results for input(s): ETH in the last 168 hours.  IMAGING past 24 hours DG Swallowing Func-Speech Pathology  Result Date: 01/19/2022 Table formatting from the original result was not included. Objective Swallowing Evaluation: Type of Study: MBS-Modified Barium Swallow Study  Patient Details Name: Frederick [name] MRN: [name] Date of Birth: 1961/08/21 Today's Date: 01/19/2022 Time: SLP Start Time (ACUTE ONLY): 1126 -SLP Stop Time (ACUTE ONLY): 1145 SLP Time Calculation (min) (ACUTE ONLY): 19 min Past Medical History: Past Medical History: Diagnosis  Date  Arthritis   Asthma   Bilateral lower extremity edema 05/28/2021  propping feet up  Cough 05/28/2021  with clear sputum  Dyspnea   Dyspnea 05/28/2021  with exertion  EtOH dependence (HCC)   drinks 2-3 of 40ounes a day  Fibula fracture   Dr 05/30/2021 02-2011  Finger osteomyelitis, right (HCC) 05/26/2021  right index finger  Hernia   surgery 01-17-13  Hypertension   not taking bp meds since may 2022  Seizures (HCC)   Streptococcal bacteremia 09/09/2021  Tuberculosis   couple of yrs ago took tx at 11/09/2021 health departmen per pt on 05-28-2021 Past Surgical History: Past Surgical History: Procedure Laterality Date  AMPUTATION Right 05/29/2021  Procedure: PARTIAL RIGHT 2ND FINGER AMPUTATION;  Surgeon: 05/31/2021, MD;  Location: [facility];  Service: Orthopedics;  Laterality: Right;  ANKLE SURGERY    INGUINAL HERNIA REPAIR Right 01/19/2013  Procedure: HERNIA REPAIR INGUINAL ADULT;  Surgeon: 01/21/2013, MD,FACS;  Location: [facility] OR;  Service: General;  Laterality: Right;  TEE WITHOUT CARDIOVERSION N/A 08/14/2021  Procedure: TRANSESOPHAGEAL ECHOCARDIOGRAM (TEE);  Surgeon: 10/14/2021, MD;  Location: [facility] ENDOSCOPY;  Service: Cardiovascular;  Laterality: N/A;  TIBIA IM NAIL INSERTION Left 01/22/2013  Procedure: INTRAMEDULLARY (IM) NAIL TIBIAL;  Surgeon: [name], MD;  Location: [facility] OR;  Service: Orthopedics;  Laterality: Left; HPI: Pt is a 61 year old male who presented to [facility] ED 1/31 via for seizures, concern for alcohol withdrawal. On arrival to ED, patient had ongoing focal left-sided seizure activity. He was loaded with Keppra and intubated for airway protection. ETT 1/31-2/3. EEG 2/2: severe diffuse encephalopathy. No seizures or epileptiform discharges. Pt found to have COVID-19. 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 PMH: HTN, asthma, tuberculosis (s/p  treatment at [facility]), GERD, EtOH abuse, R finger osteomyelitis with strep bacteremia. Last seen by SLP September, 2022 with recommendation for regular texture solids and thin liquids  Subjective: more participatory today  Recommendations for follow up therapy are one component of a multi-disciplinary discharge planning process, led by the attending physician.  Recommendations may be updated based on patient status, additional functional criteria and insurance authorization. Assessment / Plan / Recommendation Clinical Impressions 01/19/2022 Clinical Impression Pt has mild oral and pharyngoesophageal dysphagia. His mastication takes some increased effort with regular solids and he has reduced lingual propulsion posteriorly with purees/solids. Premature spillage is noted with thin liquids and lingual residue is observed after consecutive straw sips, spilling into the valleculae after the swallow. Pharyngeal he has minimally reduced laryngeal closure that when paired with premature spillage results in trace penetration before the swallow that clears spontaneously either as he finishes that swallow or completes a second reflexive one (PAS 2). Pt also has reduced UES opening, with suspected osteophytes felt to be contributing to mild amounts of backflow that re-enter the pharynx. Removal of cortrak may also help with bolus flow, but during this study, penetration occurred only once as thin liquid backflow spilled posteriorly over the arytenoids. This also cleared spontaneously though and there was no aspiration across the study. Barium tablet also did not appear to exit the esophagus (MD not present to confirm). Recommend starting with Dys 3 diet and thin liquids. SLP Visit Diagnosis Dysphagia, oropharyngeal phase (R13.12) Attention and concentration deficit following -- Frontal lobe and executive function deficit following -- Impact on safety and function Mild aspiration risk   Treatment  Recommendations 01/19/2022 Treatment Recommendations Therapy as outlined in treatment plan below   Prognosis 01/19/2022 Prognosis for Safe Diet Advancement Good Barriers to Reach Goals Cognitive deficits Barriers/Prognosis Comment -- Diet Recommendations 01/19/2022 SLP Diet Recommendations Dysphagia 3 (Mech soft) solids;Thin liquid Liquid Administration via Cup;Straw Medication Administration Whole meds with liquid Compensations Slow rate;Small sips/bites;Minimize environmental distractions Postural Changes Seated upright at 90 degrees;Remain semi-upright after after feeds/meals (Comment)   Other Recommendations 01/19/2022 Recommended Consults -- Oral Care Recommendations Oral care BID Other Recommendations -- Follow Up Recommendations Skilled nursing-short term rehab (<3 hours/day) Assistance recommended at discharge Frequent or constant Supervision/Assistance Functional Status Assessment Patient has had a recent decline in their functional status and demonstrates the ability to make significant improvements in function in a reasonable and predictable amount of time. Frequency and Duration  01/19/2022 Speech Therapy Frequency (ACUTE ONLY) min 2x/week Treatment Duration 2 weeks   Oral Phase 01/19/2022 Oral Phase Impaired Oral - Pudding Teaspoon -- Oral - Pudding Cup -- Oral - Honey Teaspoon -- Oral - Honey Cup -- Oral - Nectar Teaspoon -- Oral - Nectar Cup -- Oral - Nectar Straw -- Oral -  Thin Teaspoon -- Oral - Thin Cup Premature spillage Oral - Thin Straw Premature spillage;Lingual/palatal residue Oral - Puree Reduced posterior propulsion;Delayed oral transit Oral - Mech Soft -- Oral - Regular Impaired mastication Oral - Multi-Consistency -- Oral - Pill WFL Oral Phase - Comment --  Pharyngeal Phase 01/19/2022 Pharyngeal Phase Impaired Pharyngeal- Pudding Teaspoon -- Pharyngeal -- Pharyngeal- Pudding Cup -- Pharyngeal -- Pharyngeal- Honey Teaspoon -- Pharyngeal -- Pharyngeal- Honey Cup -- Pharyngeal -- Pharyngeal- Nectar  Teaspoon -- Pharyngeal -- Pharyngeal- Nectar Cup -- Pharyngeal -- Pharyngeal- Nectar Straw -- Pharyngeal -- Pharyngeal- Thin Teaspoon -- Pharyngeal -- Pharyngeal- Thin Cup Penetration/Aspiration during swallow;Reduced airway/laryngeal closure Pharyngeal Material enters airway, remains ABOVE vocal cords then ejected out Pharyngeal- Thin Straw Reduced airway/laryngeal closure;Penetration/Aspiration during swallow;Penetration/Apiration after swallow Pharyngeal Material enters airway, remains ABOVE vocal cords then ejected out Pharyngeal- Puree WFL Pharyngeal -- Pharyngeal- Mechanical Soft -- Pharyngeal -- Pharyngeal- Regular WFL Pharyngeal -- Pharyngeal- Multi-consistency -- Pharyngeal -- Pharyngeal- Pill WFL Pharyngeal -- Pharyngeal Comment --  Cervical Esophageal Phase  01/19/2022 Cervical Esophageal Phase Impaired Pudding Teaspoon -- Pudding Cup -- Honey Teaspoon -- Honey Cup -- Nectar Teaspoon -- Nectar Cup -- Nectar Straw -- Thin Teaspoon -- Thin Cup Reduced cricopharyngeal relaxation;Esophageal backflow into the pharynx Thin Straw Reduced cricopharyngeal relaxation;Esophageal backflow into the pharynx Puree Reduced cricopharyngeal relaxation;Esophageal backflow into the pharynx Mechanical Soft -- Regular Reduced cricopharyngeal relaxation;Esophageal backflow into the pharynx Multi-consistency -- Pill Reduced cricopharyngeal relaxation Cervical Esophageal Comment -- Mahala Menghini., M.A. CCC-SLP Acute Rehabilitation Services Pager [phone] Office [phone] 01/19/2022, 1:35 PM                     ECHOCARDIOGRAM COMPLETE  Result Date: 01/18/2022    ECHOCARDIOGRAM REPORT   Patient Name:   Frederick [name] Date of Exam: 01/18/2022 Medical Rec #:  [id number]      Height:       73.0 in Accession #:    [id number]     Weight:       173.9 lb Date of Birth:  1960-12-14      BSA:          2.028 m Patient Age:    60 years       BP:           119/65 mmHg Patient Gender: M              HR:           91 bpm. Exam Location:   Inpatient Procedure: 2D Echo, Cardiac Doppler and Color Doppler Indications:    Stroke  History:        Patient has prior history of Echocardiogram examinations. CHF;                 Risk Factors:Current Smoker. ETOH abuse. CKD.  Sonographer:    Ross Ludwig RDCS (AE) Referring Phys: [id number] Reyne Dumas [facility] IMPRESSIONS  1. Left ventricular ejection fraction, by estimation, is 55 to 60%. The left ventricle has normal function. The left ventricle has no regional wall motion abnormalities. There is moderate asymmetric left ventricular hypertrophy of the basal-septal segment. Left ventricular diastolic parameters were normal.  2. Right ventricular systolic function is normal. The right ventricular size is normal. There is normal pulmonary artery systolic pressure. The estimated right ventricular systolic pressure is 23.8 mmHg.  3. The mitral valve is normal in structure. Trivial mitral valve regurgitation.  4. The aortic valve was not well visualized. Aortic valve regurgitation is not  visualized. No aortic stenosis is present.  5. The inferior vena cava is normal in size with greater than 50% respiratory variability, suggesting right atrial pressure of 3 mmHg. FINDINGS  Left Ventricle: Left ventricular ejection fraction, by estimation, is 55 to 60%. The left ventricle has normal function. The left ventricle has no regional wall motion abnormalities. The left ventricular internal cavity size was normal in size. There is  moderate asymmetric left ventricular hypertrophy of the basal-septal segment. Left ventricular diastolic parameters were normal. Right Ventricle: The right ventricular size is normal. Right vetricular wall thickness was not well visualized. Right ventricular systolic function is normal. There is normal pulmonary artery systolic pressure. The tricuspid regurgitant velocity is 2.28 m/s, and with an assumed right atrial pressure of 3 mmHg, the estimated right ventricular systolic pressure is 23.8 mmHg. Left  Atrium: Left atrial size was normal in size. Right Atrium: Right atrial size was normal in size. Pericardium: There is no evidence of pericardial effusion. Mitral Valve: The mitral valve is normal in structure. Trivial mitral valve regurgitation. Tricuspid Valve: The tricuspid valve is normal in structure. Tricuspid valve regurgitation is trivial. Aortic Valve: The aortic valve was not well visualized. Aortic valve regurgitation is not visualized. No aortic stenosis is present. Aortic valve mean gradient measures 3.0 mmHg. Aortic valve peak gradient measures 5.6 mmHg. Aortic valve area, by VTI measures 3.77 cm. Pulmonic Valve: The pulmonic valve was not well visualized. Pulmonic valve regurgitation is not visualized. Aorta: The aortic root is normal in size and structure. Venous: The inferior vena cava is normal in size with greater than 50% respiratory variability, suggesting right atrial pressure of 3 mmHg. IAS/Shunts: The interatrial septum was not well visualized.  LEFT VENTRICLE PLAX 2D LVIDd:         4.20 cm      Diastology LVIDs:         3.30 cm      LV e' medial:    9.15 cm/s LV PW:         2.20 cm      LV E/e' medial:  5.2 LV IVS:        2.30 cm      LV e' lateral:   13.40 cm/s LVOT diam:     2.40 cm      LV E/e' lateral: 3.5 LV SV:         72 LV SV Index:   36 LVOT Area:     4.52 cm  LV Volumes (MOD) LV vol d, MOD A2C: 82.0 ml LV vol d, MOD A4C: 103.0 ml LV vol s, MOD A2C: 37.3 ml LV vol s, MOD A4C: 41.4 ml LV SV MOD A2C:     44.7 ml LV SV MOD A4C:     103.0 ml LV SV MOD BP:      54.3 ml RIGHT VENTRICLE             IVC RV Basal diam:  2.70 cm     IVC diam: 1.20 cm RV S prime:     14.30 cm/s TAPSE (M-mode): 1.3 cm LEFT ATRIUM             Index        RIGHT ATRIUM           Index LA diam:        3.30 cm 1.63 cm/m   RA Area:     13.30 cm LA Vol (A2C):   38.6 ml 19.04 ml/m  RA Volume:  28.70 ml  14.15 ml/m LA Vol (A4C):   35.2 ml 17.36 ml/m LA Biplane Vol: 40.6 ml 20.02 ml/m  AORTIC VALVE AV Area  (Vmax):    3.26 cm AV Area (Vmean):   3.28 cm AV Area (VTI):     3.77 cm AV Vmax:           118.00 cm/s AV Vmean:          79.300 cm/s AV VTI:            0.192 m AV Peak Grad:      5.6 mmHg AV Mean Grad:      3.0 mmHg LVOT Vmax:         85.10 cm/s LVOT Vmean:        57.500 cm/s LVOT VTI:          0.160 m LVOT/AV VTI ratio: 0.83  AORTA Ao Root diam: 3.80 cm MITRAL VALVE               TRICUSPID VALVE MV Area (PHT): 3.17 cm    TR Peak grad:   20.8 mmHg MV Decel Time: 239 msec    TR Vmax:        228.00 cm/s MV E velocity: 47.30 cm/s MV A velocity: 51.90 cm/s  SHUNTS MV E/A ratio:  0.91        Systemic VTI:  0.16 m                            Systemic Diam: 2.40 cm Epifanio Lesches MD Electronically signed by Epifanio Lesches MD Signature Date/Time: 01/18/2022/7:55:25 PM    Final     PHYSICAL EXAM General:  Alert, thin-appearing African-American middle-aged male in no acute distress.  Patient is irritable and not cooperative   NEURO:  Mental Status: AA&Ox3  Speech/Language: speech is with some dysarthria No aphasia.  Follows commands well.    Cranial Nerves:  II: PERRL.  III, IV, VI: EOMI. Eyelids elevate symmetrically.  V: Sensation is intact to light touch and symmetrical to face.  VII: Smile is symmetrical.  VIII: hearing intact to voice. IX, X:  Phonation is normal.  XII: tongue is midline without fasciculations. Motor: 5/5 strength to all muscle groups tested.  Sensation- Intact to light touch bilaterally.   Diminished fine finger movements on the left.  Orbits right over left upper extremity.  Trace left grip weakness. Coordination: FTN and HKS with some dysmetria, bilaterally fine motor movements impaired Gait- deferred   ASSESSMENT/PLAN [name] is a 61 y.o. male with history of seizures, ETOH abuse, CKD3 who was admitted on 1/31 with seizures in the setting of noncompliance with his Keppra and possible ETOH withdrawal.  Patient was intubated on arrival and extubated  three days later.  MRI performed to investigate continued altered mental status demonstrated small right cerebellar stroke.   Stroke:  right cerebellar infarct  likely secondary to small vessel disease.  Subacute right corona radiata lacunar infarct CT head No acute abnormality.  CTA head & neck no emergent findings, 50% atheromatous narrowing at right carotid siphon MRI  small acute right cerebellar infarct, chronic small infarcts in right frontal parietal white matter, cerebral volume loss and chronic small vessel ischemia 2D Echo ejection fraction 55 to 60%.  LDL 110 HgbA1c 5.9 VTE prophylaxis - SQH    Diet   DIET DYS 3 Room service appropriate? Yes with Assist; Fluid consistency: Thin   Eliquis (apixaban)  daily prior to admission for DVT but completed 3 months of treatment no need for longer anticoagulation., now on aspirin 81 mg daily and cilostazol  Therapy recommendations:  SNF Disposition:  pending  Seizures Patient was admitted with status epilepticus in setting of noncompliance with Keppra Continue Keppra 1000 mg q12h  ETOH abuse Patient has history of ETOH abuse Ataxia seen on exam may be patient's baseline Continue CIWA protocol  Hypertension Home meds:  none Stable Kepp BP <180/105 Long-term BP goal normotensive  Hyperlipidemia Home meds:  none LDL 110, goal < 70 Add atorvastatin 40 mg daily  Continue statin at discharge  Other Stroke Risk Factors Cigarette smoker advised to stop smoking ETOH use, alcohol level <10, advised not to drink  Other Active Problems COVID-19 infection Incidental finding, asymptomatic  Hospital day # 14    Patient presented with altered mental status which was felt to be related to seizures from medication noncompliance but MRI scan shows a subacute right corona radiata as well as small acute right cerebellar lacunar infarcts likely from small vessel disease.  CT angiogram does not show significant large vessel disease and  echocardiogram is pending.  Recommend aspirin and add Pletal 100 mg twice daily as recent evidence [facility] suggest benefit from it.  Continue ongoing stroke work-up and aggressive risk factor modifications.  Continue Keppra for seizure.  Patient counseled to be compliant with his medications.  Greater than 50% time during this25-minute visit was spent in counseling and coordination of care about his lacunar strokes and seizures discussion about seizure and stroke prevention and answering questions.  Stroke team will sign off.  Kindly call for questions Delia HeadyPramod Glynn Yepes, MD Medical Director [facility] Pager: [phone] 01/19/2022 3:00 PM     To contact Stroke Continuity provider, please refer to [email or web address]. After hours, contact General Neurology

## 2022-01-19 NOTE — Care Management Important Message (Signed)
Important Message  Patient Details  Name: Frederick [name] MRN: [id number] Date of Birth: Nov 25, 1961   Medicare Important Message Given:  Yes     Joffrey Kerce Stefan Church 01/19/2022, 11:27 AM

## 2022-01-19 NOTE — Progress Notes (Addendum)
PROGRESS NOTE    Frederick [name]  LKJ:[id number] DOB: [birth year] DOA: 01/05/2022 PCP: [name]    Chief Complaint  Patient presents with   Seizures    Brief Narrative:  61 year old man who presented to [facility] ED 1/31 via EMS for seizures, concern for alcohol withdrawal. PMHx significant for HTN, asthma, tuberculosis (s/p treatment at [facility]), EtOH abuse, R finger osteomyelitis with strep bacteremia.  Of note, patient has seizure history with unclear medication compliance.   Per report, patient experienced several episodes of seizure activity at home.  EMS was called and patient was continuing to seize on arrival.  On arrival to ED, patient had ongoing focal left-sided seizure activity.  He was intubated for airway protection.  Keppra load was administered and EEG was obtained.  Neurology was consulted.  1/31 - BIB EMS after seizure at home, persistently seizing in ED with focal seizure activity of L hand. Difficult to sedate, history of EtOH abuse/withdrawal. Intubated for airway protection. C/f ongoing seizure, Neuro consult, EEG ordered. 2/1 - Burst suppression pattern on EEG 2/2 - Sedation weaned with no recurrence of clinical seizures.  2/3 - WUA/SBT, extubated 2/6 - New fever. Precedex weaned off.  2/8 -transferred to progressive unit under [facility] hospitalist. 2/12 MRI brain showing small CVA 2/14, MBS has been performed started on dysphagia diet. Assessment & Plan:   Principal Problem:   Status epilepticus ([place]) Active Problems:   EtOH dependence ([place])   Cavitary lesion of lung   CKD (chronic kidney disease), stage III (HCC)   Protein-calorie malnutrition, severe   Acute metabolic encephalopathy   Cerebral thrombosis with cerebral infarction    Status Epilepticus  Acute metabolic encephalopathy  History of Alcohol Abuse  Delirium -Seizure management per neurology, patient currently on Keppra 1000 mg p.o. twice daily, and as needed Ativan as  well. -Seizure most likely provoked by having his noncompliance with medicine as his significant other report he has not been compliant with his meds, reports last drink was 4 months ago, so unlikely the seizures related to alcohol withdrawal seizures.  But more likely due to medication noncompliance. -Continue seizure precautions, continue with delirium precaution as well -He is on CIWA protocol. -He remains with significant hospital delirium, he is gradually improving, more awake and interactive, continue with current dose Seroquel ,  as needed Ativan overnight.   -Unable to pass bedside swallow evaluation, went for MBS today, he is advanced to dysphagia diet.. -B12 level is borderline low, will start on IM supplement during hospital stay, to start on oral supplement on discharge.   Acute hypoxic respiratory failure 2/2 post ictal state COVID-19 infection (incidental) History of TB s/p treatment (2016)  History of Asthma  -Remains altered, but will encourage incentive spirometer and flutter valve when he is more awake and appropriate. -Continue to wean oxygen as tolerated.  Acute CVA -Patient mentation has been improving and he is more cooperative with examination, he was noted to have some focal deficits, MRI on 2/12 significant for left cerebellar infarct, neurology has been consulted. -Management per neurology, started on aspirin and Pletal   Febrile /Klebsiella pneumonia - Patient is treated with Zosyn, sputum culture growing Klebsiella, narrowed to Rocephin, no further need for IV antibiotics.    Sinus Tachycardia HR gradually increased since the evening of 2/6.  -Blood pressure is elevated, as well heart rate is elevated, did  increase Coreg .   Hypertension  Blood pressure increased over the last 24 hours. Only  BP medication at this time is low-dose Coreg.   -Blood pressure improved after increasing Coreg to 25 mg BID   Dysphagia  -MBS done 2/14, advance to dysphagia 3  diet. -Continue with tube feed till oral intake is reliable.   HFrEF  TEE in September 2022 with TEE demonstrating EF of 45%.  Pete echo this admission showing improved EF at 55%.   History of DVT  On Apixaban prior to arrival. Complated over 3 months of treatment. No indication for continued anti-coagulation  - DVT ppx only    GERD - PPI   Pressure ulcer : -Continue with wound care. Pressure Injury 01/13/22 Buttocks Mid Unstageable - Full thickness tissue loss in which the base of the injury is covered by slough (yellow, tan, gray, green or brown) and/or eschar (tan, brown or black) in the wound bed. yellow (Active)  01/13/22 2000  Location: Buttocks  Location Orientation: Mid  Staging: Unstageable - Full thickness tissue loss in which the base of the injury is covered by slough (yellow, tan, gray, green or brown) and/or eschar (tan, brown or black) in the wound bed.  Wound Description (Comments): yellow  Present on Admission: No    Hypoglycemia -This is most likely due to erroneous readings from his fingers, do not correlate to his BMP and earlobe checks, I will decrease his D10 to 20 cc, will continue to taper .  Flexi-Seal was discontinued, he is started on scheduled Librium for couple days with improvement of his diarrhea.     DVT prophylaxis: Heparin Code Status: Full Family Communication: None at bedside, discussed with significant other Katharine Look on 12/12 by phone per patient's request, they have been living together for more than 10 years.  I left her a voicemail 2/14. Disposition: will need SNF  Status is: Inpatient Remains inpatient appropriate because: Acute CVA work-up          Consultants:  PCCM Neurology   Subjective:  Patient denies any complaints today, asking when he can start eating, and asking about discharge.  Objective: Vitals:   01/19/22 0044 01/19/22 0400 01/19/22 0510 01/19/22 0801  BP: (!) 149/83  (!) 131/92 122/80  Pulse: 77 95 89 86   Resp: (!) _0 Temp: (!) 97.3 F (36.3 C)  97.8 F (36.6 C) 97.6 F (36.4 C)  TempSrc: Axillary  Oral Oral  SpO2: 95%  97% 97%  Weight:      Height:        Intake/Output Summary (Last 24 hours) at 01/19/2022 1348 Last data filed at 01/18/2022 2315 Gross per 24 hour  Intake 720 ml  Output 1100 ml  Net -380 ml   Filed Weights   01/14/22 0500 01/17/22 0500 01/18/22 0500  Weight: 79.1 kg 79 kg 78.9 kg    Examination:  Awake, alert, conversant, pleasant, answering questions and follow commands  Symmetrical Chest wall movement, Good air movement bilaterally, CTAB RRR,No Gallops,Rubs or new Murmurs, No Parasternal Heave +ve B.Sounds, Abd Soft, No tenderness, No rebound - guarding or rigidity. No Cyanosis, Clubbing or edema, No new Rash or bruise       Data Reviewed: I have personally reviewed following labs and imaging studies  CBC: Recent Labs  Lab 01/14/22 0357 01/16/22 0039 01/18/22 0948 01/19/22 0100  WBC 6.2 7.8 9.6 10.7*  HGB 12.1* 11.6* 12.4* 11.8*  HCT 37.9* 37.3* 39.4 38.2*  MCV 85.0 86.3 87.0 87.8  PLT 194 232 331 355    Basic Metabolic Panel: Recent Labs  Lab 01/13/22 1426 01/14/22 0357 01/16/22 0039 01/18/22 0948 01/19/22 0100 01/19/22 0808  NA  --  139 138 135 132*  --   K  --  4.4 4.4 4.5 5.5* 4.3  CL  --  104 104 102 98  --   CO2  --  _0 --   GLUCOSE 130* 128* 132* 114* 98  --   BUN  --  28* 33* 25* 22  --   CREATININE  --  1.09 0.96 0.82 0.80  --   CALCIUM  --  9.0 9.0 9.4 9.3  --     GFR: Estimated Creatinine Clearance: 108.2 mL/min (by C-G formula based on SCr of 0.8 mg/dL).  Liver Function Tests: No results for input(s): AST, ALT, ALKPHOS, BILITOT, PROT, ALBUMIN in the last 168 hours.   CBG: Recent Labs  Lab 01/19/22 0100 01/19/22 0512 01/19/22 0802 01/19/22 1230 01/19/22 1236  GLUCAP 103* 124* 109* 51* 124*     Recent Results (from the past 240 hour(s))  Culture, Respiratory w Gram Stain     Status:  None   Collection Time: 01/11/22 10:26 AM   Specimen: Tracheal Aspirate; Respiratory  Result Value Ref Range Status   Specimen Description TRACHEAL ASPIRATE  Final   Special Requests NONE  Final   Gram Stain   Final    FEW WBC PRESENT,BOTH PMN AND MONONUCLEAR FEW GRAM POSITIVE COCCI IN PAIRS FEW GRAM POSITIVE COCCI IN CHAINS FEW GRAM NEGATIVE COCCI FEW SQUAMOUS EPITHELIAL CELLS PRESENT    Culture   Final    FEW KLEBSIELLA PNEUMONIAE WITHIN NORMAL RESPIRATORY FLORA Performed at [facility], [place] [address], [place], [place] [address]    Report Status 01/13/2022 FINAL  Final   Organism ID, Bacteria KLEBSIELLA PNEUMONIAE  Final      Susceptibility   Klebsiella pneumoniae - MIC*    AMPICILLIN >=32 RESISTANT Resistant     CEFAZOLIN <=4 SENSITIVE Sensitive     CEFEPIME <=0.12 SENSITIVE Sensitive     CEFTAZIDIME <=1 SENSITIVE Sensitive     CEFTRIAXONE <=0.25 SENSITIVE Sensitive     CIPROFLOXACIN <=0.25 SENSITIVE Sensitive     GENTAMICIN <=1 SENSITIVE Sensitive     IMIPENEM <=0.25 SENSITIVE Sensitive     TRIMETH/SULFA <=20 SENSITIVE Sensitive     AMPICILLIN/SULBACTAM 4 SENSITIVE Sensitive     PIP/TAZO <=4 SENSITIVE Sensitive     * FEW KLEBSIELLA PNEUMONIAE  MRSA Next Gen by PCR, Nasal     Status: None   Collection Time: 01/11/22 12:29 PM   Specimen: Nasal Mucosa; Nasal Swab  Result Value Ref Range Status   MRSA by PCR Next Gen NOT DETECTED NOT DETECTED Final    Comment: (NOTE) The GeneXpert MRSA Assay (FDA approved for NASAL specimens only), is one component of a comprehensive MRSA colonization surveillance program. It is not intended to diagnose MRSA infection nor to guide or monitor treatment for MRSA infections. Test performance is not FDA approved in patients less than 35 years old. Performed at [facility], [place] [address] [place], [place] [address]   Culture, blood (routine x 2)     Status: None   Collection Time: 01/11/22  1:11 PM   Specimen: BLOOD   Result Value Ref Range Status   Specimen Description BLOOD RIGHT ANTECUBITAL  Final   Special Requests   Final    BOTTLES DRAWN AEROBIC AND ANAEROBIC Blood Culture adequate volume   Culture   Final    NO GROWTH  5 DAYS Performed at [facility], [place] [address], [place],  [address]    Report Status 01/16/2022 FINAL  Final  Culture, blood (routine x 2)     Status: None   Collection Time: 01/11/22  1:11 PM   Specimen: BLOOD  Result Value Ref Range Status   Specimen Description BLOOD RIGHT ANTECUBITAL  Final   Special Requests   Final    BOTTLES DRAWN AEROBIC ONLY Blood Culture adequate volume   Culture   Final    NO GROWTH 5 DAYS Performed at [facility], [place] [address] [place],  [address]    Report Status 01/16/2022 FINAL  Final         Radiology Studies: CT ANGIO HEAD NECK W WO CM  Result Date: 01/18/2022 CLINICAL DATA:  Stroke, determine embolic source EXAM: CT ANGIOGRAPHY HEAD AND NECK TECHNIQUE: Multidetector CT imaging of the head and neck was performed using the standard protocol during bolus administration of intravenous contrast. Multiplanar CT image reconstructions and MIPs were obtained to evaluate the vascular anatomy. Carotid stenosis measurements (when applicable) are obtained utilizing NASCET criteria, using the distal internal carotid diameter as the denominator. RADIATION DOSE REDUCTION: This exam was performed according to the departmental dose-optimization program which includes automated exposure control, adjustment of the mA and/or kV according to patient size and/or use of iterative reconstruction technique. CONTRAST:  31m OMNIPAQUE IOHEXOL 350 MG/ML SOLN COMPARISON:  Brain MRI from yesterday FINDINGS: CT HEAD FINDINGS Brain: Underestimated ischemia compared to prior brain MRI. No hemorrhage or visible progression. No hydrocephalus or collection. Chronic small vessel disease. Vascular: No hyperdense vessel or unexpected calcification.  Skull: Normal. Negative for fracture or focal lesion. Sinuses: Imaged portions are clear. Orbits: Negative Review of the MIP images confirms the above findings CTA NECK FINDINGS Aortic arch: Atheromatous plaque. Four vessel branching with both vertebral arteries arising from the arch, the right vertebral artery with a retroesophageal course. Right carotid system: Calcified plaque at the posterior wall of the ICA bulb. No stenosis or ulceration. Left carotid system: Mild atheromatous plaque on the proximal ICA without stenosis or ulceration. Vertebral arteries: No proximal subclavian stenosis. Both vertebral arteries arise from the arch. Motion artifact affects visualization of the proximal left vertebral artery. Calcified plaque at the right vertebral origin without stenosis. Both cervical vertebral arteries are smoothly contoured in the neck. Skeleton: Bulky spondylosis. Other neck: No acute finding Upper chest: Right apical cavity, reference chest CT September 2022. Clustered nodularity at the left apex, inflammatory appearing and similar to prior. Review of the MIP images confirms the above findings CTA HEAD FINDINGS Anterior circulation: Atheromatous plaque on the carotid siphons with up to 50% narrowing at the right anterior genu. No branch occlusion or flow limiting stenosis, beading, or aneurysm. Posterior circulation: Codominant vertebral arteries. Proximal basilar fenestration. Vertebral and basilar arteries are smoothly contoured and diffusely patent. No PCA branch occlusion, beading, or aneurysm. Venous sinuses: Unremarkable Anatomic variants: As above Review of the MIP images confirms the above findings IMPRESSION: 1. No emergent finding. No flow limiting stenosis or embolic source to explain infarcts. 2. ~50% atheromatous narrowing at the right carotid siphon. Electronically Signed   By: JJorje GuildM.D.   On: 01/18/2022 11:18   DG Swallowing Func-Speech Pathology  Result Date: 01/19/2022 Table  formatting from the original result was not included. Objective Swallowing Evaluation: Type of Study: MBS-Modified Barium Swallow Study  Patient Details Name: ADINA MOBLEYMRN: [id number] of Birth: [id number]'s  Date: 01/19/2022 Time: SLP Start Time (ACUTE ONLY): 1126 -SLP Stop Time (ACUTE ONLY): 1145 SLP Time Calculation (min) (ACUTE ONLY): 19 min Past Medical History: Past Medical History: Diagnosis Date  Arthritis   Asthma   Bilateral lower extremity edema 05/28/2021  propping feet up  Cough 05/28/2021  with clear sputum  Dyspnea   Dyspnea 05/28/2021  with exertion  EtOH dependence (HCC)   drinks 2-3 of 40ounes a day  Fibula fracture   Dr Mardelle Matte 02-2011  Finger osteomyelitis, right ([place]) 05/26/2021  right index finger  Hernia   surgery 01-17-13  Hypertension   not taking bp meds since may 2022  Seizures ([place])   Streptococcal bacteremia 09/09/2021  Tuberculosis   couple of yrs ago took tx at [place] per pt on 05-28-2021 Past Surgical History: Past Surgical History: Procedure Laterality Date  AMPUTATION Right 05/29/2021  Procedure: PARTIAL RIGHT 2ND FINGER AMPUTATION;  Surgeon: Mcarthur Rossetti, MD;  Location: [facility];  Service: Orthopedics;  Laterality: Right;  ANKLE SURGERY    INGUINAL HERNIA REPAIR Right 01/19/2013  Procedure: HERNIA REPAIR INGUINAL ADULT;  Surgeon: Gayland Curry, MD,FACS;  Location: [facility];  Service: General;  Laterality: Right;  TEE WITHOUT CARDIOVERSION N/A 08/14/2021  Procedure: TRANSESOPHAGEAL ECHOCARDIOGRAM (TEE);  Surgeon: Josue Hector, MD;  Location[facility] ENDOSCOPY;  Service: Cardiovascular;  Laterality: N/A;  TIBIA IM NAIL INSERTION Left 01/22/2013  Procedure: INTRAMEDULLARY (IM) NAIL TIBIAL;  Surgeon: Johnny Bridge, MD;  Location: [facility];  Service: Orthopedics;  Laterality: Left; HPI: Pt is a 61 year old male who presented to [facility] ED 1/31 via for seizures, concern for alcohol withdrawal. On arrival to ED, patient had ongoing focal  left-sided seizure activity. He was loaded with Keppra and intubated for airway protection. ETT 1/31-2/3. EEG 2/2: severe diffuse encephalopathy. No seizures or epileptiform discharges. Pt found to have COVID-19. 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 PMH: HTN, asthma, tuberculosis (s/p treatment at [facility]), GERD, EtOH abuse, R finger osteomyelitis with strep bacteremia. Last seen by SLP September, 2022 with recommendation for regular texture solids and thin liquids  Subjective: more participatory today  Recommendations for follow up therapy are one component of a multi-disciplinary discharge planning process, led by the attending physician.  Recommendations may be updated based on patient status, additional functional criteria and insurance authorization. Assessment / Plan / Recommendation Clinical Impressions 01/19/2022 Clinical Impression Pt has mild oral and pharyngoesophageal dysphagia. His mastication takes some increased effort with regular solids and he has reduced lingual propulsion posteriorly with purees/solids. Premature spillage is noted with thin liquids and lingual residue is observed after consecutive straw sips, spilling into the valleculae after the swallow. Pharyngeal he has minimally reduced laryngeal closure that when paired with premature spillage results in trace penetration before the swallow that clears spontaneously either as he finishes that swallow or completes a second reflexive one (PAS 2). Pt also has reduced UES opening, with suspected osteophytes felt to be contributing to mild amounts of backflow that re-enter the pharynx. Removal of cortrak may also help with bolus flow, but during this study, penetration occurred only once as thin liquid backflow spilled posteriorly over the arytenoids. This also cleared spontaneously though and there  was no aspiration across the study. Barium tablet also did not appear to exit the esophagus (MD not present to confirm). Recommend starting with  Dys 3 diet and thin liquids. SLP Visit Diagnosis Dysphagia, oropharyngeal phase (R13.12) Attention and concentration deficit following -- Frontal lobe and executive function deficit following -- Impact on safety and function Mild aspiration risk   Treatment Recommendations 01/19/2022 Treatment Recommendations Therapy as outlined in treatment plan below   Prognosis 01/19/2022 Prognosis for Safe Diet Advancement Good Barriers to Reach Goals Cognitive deficits Barriers/Prognosis Comment -- Diet Recommendations 01/19/2022 SLP Diet Recommendations Dysphagia 3 (Mech soft) solids;Thin liquid Liquid Administration via Cup;Straw Medication Administration Whole meds with liquid Compensations Slow rate;Small sips/bites;Minimize environmental distractions Postural Changes Seated upright at 90 degrees;Remain semi-upright after after feeds/meals (Comment)   Other Recommendations 01/19/2022 Recommended Consults -- Oral Care Recommendations Oral care BID Other Recommendations -- Follow Up Recommendations Skilled nursing-short term rehab (<3 hours/day) Assistance recommended at discharge Frequent or constant Supervision/Assistance Functional Status Assessment Patient has had a recent decline in their functional status and demonstrates the ability to make significant improvements in function in a reasonable and predictable amount of time. Frequency and Duration  01/19/2022 Speech Therapy Frequency (ACUTE ONLY) min 2x/week Treatment Duration 2 weeks   Oral Phase 01/19/2022 Oral Phase Impaired Oral - Pudding Teaspoon -- Oral - Pudding Cup -- Oral - Honey Teaspoon -- Oral - Honey Cup -- Oral - Nectar Teaspoon -- Oral - Nectar Cup -- Oral - Nectar Straw -- Oral - Thin Teaspoon -- Oral - Thin Cup Premature spillage Oral - Thin Straw Premature spillage;Lingual/palatal residue Oral - Puree Reduced  posterior propulsion;Delayed oral transit Oral - Mech Soft -- Oral - Regular Impaired mastication Oral - Multi-Consistency -- Oral - Pill WFL Oral Phase - Comment --  Pharyngeal Phase 01/19/2022 Pharyngeal Phase Impaired Pharyngeal- Pudding Teaspoon -- Pharyngeal -- Pharyngeal- Pudding Cup -- Pharyngeal -- Pharyngeal- Honey Teaspoon -- Pharyngeal -- Pharyngeal- Honey Cup -- Pharyngeal -- Pharyngeal- Nectar Teaspoon -- Pharyngeal -- Pharyngeal- Nectar Cup -- Pharyngeal -- Pharyngeal- Nectar Straw -- Pharyngeal -- Pharyngeal- Thin Teaspoon -- Pharyngeal -- Pharyngeal- Thin Cup Penetration/Aspiration during swallow;Reduced airway/laryngeal closure Pharyngeal Material enters airway, remains ABOVE vocal cords then ejected out Pharyngeal- Thin Straw Reduced airway/laryngeal closure;Penetration/Aspiration during swallow;Penetration/Apiration after swallow Pharyngeal Material enters airway, remains ABOVE vocal cords then ejected out Pharyngeal- Puree WFL Pharyngeal -- Pharyngeal- Mechanical Soft -- Pharyngeal -- Pharyngeal- Regular WFL Pharyngeal -- Pharyngeal- Multi-consistency -- Pharyngeal -- Pharyngeal- Pill WFL Pharyngeal -- Pharyngeal Comment --  Cervical Esophageal Phase  01/19/2022 Cervical Esophageal Phase Impaired Pudding Teaspoon -- Pudding Cup -- Honey Teaspoon -- Honey Cup -- Nectar Teaspoon -- Nectar Cup -- Nectar Straw -- Thin Teaspoon -- Thin Cup Reduced cricopharyngeal relaxation;Esophageal backflow into the pharynx Thin Straw Reduced cricopharyngeal relaxation;Esophageal backflow into the pharynx Puree Reduced cricopharyngeal relaxation;Esophageal backflow into the pharynx Mechanical Soft -- Regular Reduced cricopharyngeal relaxation;Esophageal backflow into the pharynx Multi-consistency -- Pill Reduced cricopharyngeal relaxation Cervical Esophageal Comment -- Osie Bond., M.A. CCC-SLP Acute Rehabilitation Services Pager [phone] Office [phone] 01/19/2022, 1:35 PM                     ECHOCARDIOGRAM  COMPLETE  Result Date: 01/18/2022    ECHOCARDIOGRAM REPORT   Patient Name:   KVION SHAPLEY Date of Exam: 01/18/2022 Medical Rec #:  [id number]      Height:       73.0 in Accession #:    [id number]     Weight:       173.9 lb Date of Birth:  08/30/1961      BSA:  2.028 m Patient Age:    57 years       BP:           119/65 mmHg Patient Gender: M              HR:           91 bpm. Exam Location:  Inpatient Procedure: 2D Echo, Cardiac Doppler and Color Doppler Indications:    Stroke  History:        Patient has prior history of Echocardiogram examinations. CHF;                 Risk Factors:Current Smoker. ETOH abuse. CKD.  Sonographer:    Clayton Lefort RDCS (AE) Referring Phys: [id number] [place]  1. Left ventricular ejection fraction, by estimation, is 55 to 60%. The left ventricle has normal function. The left ventricle has no regional wall motion abnormalities. There is moderate asymmetric left ventricular hypertrophy of the basal-septal segment. Left ventricular diastolic parameters were normal.  2. Right ventricular systolic function is normal. The right ventricular size is normal. There is normal pulmonary artery systolic pressure. The estimated right ventricular systolic pressure is 18.5 mmHg.  3. The mitral valve is normal in structure. Trivial mitral valve regurgitation.  4. The aortic valve was not well visualized. Aortic valve regurgitation is not visualized. No aortic stenosis is present.  5. The inferior vena cava is normal in size with greater than 50% respiratory variability, suggesting right atrial pressure of 3 mmHg. FINDINGS  Left Ventricle: Left ventricular ejection fraction, by estimation, is 55 to 60%. The left ventricle has normal function. The left ventricle has no regional wall motion abnormalities. The left ventricular internal cavity size was normal in size. There is  moderate asymmetric left ventricular hypertrophy of the basal-septal segment. Left ventricular diastolic  parameters were normal. Right Ventricle: The right ventricular size is normal. Right vetricular wall thickness was not well visualized. Right ventricular systolic function is normal. There is normal pulmonary artery systolic pressure. The tricuspid regurgitant velocity is 2.28 m/s, and with an assumed right atrial pressure of 3 mmHg, the estimated right ventricular systolic pressure is 63.1 mmHg. Left Atrium: Left atrial size was normal in size. Right Atrium: Right atrial size was normal in size. Pericardium: There is no evidence of pericardial effusion. Mitral Valve: The mitral valve is normal in structure. Trivial mitral valve regurgitation. Tricuspid Valve: The tricuspid valve is normal in structure. Tricuspid valve regurgitation is trivial. Aortic Valve: The aortic valve was not well visualized. Aortic valve regurgitation is not visualized. No aortic stenosis is present. Aortic valve mean gradient measures 3.0 mmHg. Aortic valve peak gradient measures 5.6 mmHg. Aortic valve area, by VTI measures 3.77 cm. Pulmonic Valve: The pulmonic valve was not well visualized. Pulmonic valve regurgitation is not visualized. Aorta: The aortic root is normal in size and structure. Venous: The inferior vena cava is normal in size with greater than 50% respiratory variability, suggesting right atrial pressure of 3 mmHg. IAS/Shunts: The interatrial septum was not well visualized.  LEFT VENTRICLE PLAX 2D LVIDd:         4.20 cm      Diastology LVIDs:         3.30 cm      LV e' medial:    9.15 cm/s LV PW:         2.20 cm      LV E/e' medial:  5.2 LV IVS:        2.30  cm      LV e' lateral:   13.40 cm/s LVOT diam:     2.40 cm      LV E/e' lateral: 3.5 LV SV:         72 LV SV Index:   36 LVOT Area:     4.52 cm  LV Volumes (MOD) LV vol d, MOD A2C: 82.0 ml LV vol d, MOD A4C: 103.0 ml LV vol s, MOD A2C: 37.3 ml LV vol s, MOD A4C: 41.4 ml LV SV MOD A2C:     44.7 ml LV SV MOD A4C:     103.0 ml LV SV MOD BP:      54.3 ml RIGHT VENTRICLE              IVC RV Basal diam:  2.70 cm     IVC diam: 1.20 cm RV S prime:     14.30 cm/s TAPSE (M-mode): 1.3 cm LEFT ATRIUM             Index        RIGHT ATRIUM           Index LA diam:        3.30 cm 1.63 cm/m   RA Area:     13.30 cm LA Vol (A2C):   38.6 ml 19.04 ml/m  RA Volume:   28.70 ml  14.15 ml/m LA Vol (A4C):   35.2 ml 17.36 ml/m LA Biplane Vol: 40.6 ml 20.02 ml/m  AORTIC VALVE AV Area (Vmax):    3.26 cm AV Area (Vmean):   3.28 cm AV Area (VTI):     3.77 cm AV Vmax:           118.00 cm/s AV Vmean:          79.300 cm/s AV VTI:            0.192 m AV Peak Grad:      5.6 mmHg AV Mean Grad:      3.0 mmHg LVOT Vmax:         85.10 cm/s LVOT Vmean:        57.500 cm/s LVOT VTI:          0.160 m LVOT/AV VTI ratio: 0.83  AORTA Ao Root diam: 3.80 cm MITRAL VALVE               TRICUSPID VALVE MV Area (PHT): 3.17 cm    TR Peak grad:   20.8 mmHg MV Decel Time: 239 msec    TR Vmax:        228.00 cm/s MV E velocity: 47.30 cm/s MV A velocity: 51.90 cm/s  SHUNTS MV E/A ratio:  0.91        Systemic VTI:  0.16 m                            Systemic Diam: 2.40 cm Oswaldo Milian MD Electronically signed by Oswaldo Milian MD Signature Date/Time: 01/18/2022/7:55:25 PM    Final         Scheduled Meds:  aspirin  81 mg Per Tube Daily   atorvastatin  40 mg Oral Daily   carvedilol  25 mg Per Tube BID WC   chlorhexidine gluconate (MEDLINE KIT)  15 mL Mouth Rinse BID   cilostazol  100 mg Oral BID   collagenase   Topical BID   feeding supplement (PROSource TF)  45 mL Per Tube BID   folic acid  1  mg Per Tube Daily   heparin  5,000 Units Subcutaneous Q8H   levETIRAcetam  1,000 mg Per Tube Q12H   multivitamin  15 mL Per Tube Daily   nicotine  21 mg Transdermal Daily   QUEtiapine  25 mg Per Tube QHS   thiamine injection  100 mg Intravenous Daily   Continuous Infusions:  cefTRIAXone (ROCEPHIN)  IV 2 g (01/19/22 1332)   dextrose 40 mL/hr at 01/18/22 0815   feeding supplement (VITAL 1.5 CAL) 1,000 mL  (01/19/22 0416)     LOS: 14 days       Phillips Climes, MD [facility]   To contact the attending provider between 7A-7P or the covering provider during after hours 7P-7A, please log into the web site [email or web address] and access using universal [place] password for that web site. If you do not have the password, please call the hospital operator.  01/19/2022, 1:48 PM

## 2022-01-19 NOTE — Consult Note (Signed)
WOC Nurse Follow Up Consult Note: Patient receiving care in [facility] [phone] Reason for Consult: Buttock wound Wound type: Unstageabe PI in the intergluteal cleft. No improvement, will continue the Santyl BID Pressure Injury POA: No Measurement: 5 cm x 1 cm x 0.2 cm Wound bed: 100% yellow Drainage (amount, consistency, odor) None Periwound: Intact Dressing procedure/placement/frequency: Clean the entire buttock area with no rinse cleanser. Pat dry and apply a nickel thick layer of Santyl on the wound, cover with moistened saline gauze, dry gauze and Sacral foam dressing upside down to prevent soiling. Apply twice daily. Changed and applied Santyl as ordered with moistened saline gauze, dry gauze and Sacral foam dressing placed upside down to prevent soiling.    Monitor the wound area(s) for worsening of condition such as: Signs/symptoms of infection, increase in size, development of or worsening of odor, development of pain, or increased pain at the affected locations.   Notify the medical team if any of these develop.   Thank you for the consult. WOC nurse will follow weekly. Please re-consult if needed.    Renaldo Reel Katrinka Blazing, MSN, RN, CMSRN, Angus Seller, [facility] Wound Treatment Associate Pager [phone]

## 2022-01-19 NOTE — Progress Notes (Signed)
Nutrition Follow-up  DOCUMENTATION CODES:   Severe malnutrition in context of chronic illness  INTERVENTION:   Continue tube feeds via Cortrak: Vital 1.5 @ 60 ml/hr (1440 ml/day) ProSource TF 45 ml BID Tube feeding regimen provides 2240 kcal, 119 grams of protein, and 1094 ml of H2O.   Change MVI to tablet   NUTRITION DIAGNOSIS:   Severe Malnutrition related to chronic illness (TB, ETOH abuse) as evidenced by severe muscle depletion, severe fat depletion. - Ongoing, being addressed via TF  GOAL:   Patient will meet greater than or equal to 90% of their needs - Met via TF  MONITOR:   TF tolerance, Labs, Skin  REASON FOR ASSESSMENT:   Consult Enteral/tube feeding initiation and management  ASSESSMENT:   Pt with PMH of HTN, asthma, TB, ETOH abuse, R finger osteomyelitis with strep bacteremia admitted with status epilepticus and acute encephalopathy.  02/01 - TF started 02/02 - hypoglycemia continues  02/03 - Cortrak placed (tip gastric), extubated 02/12 - Rectal tube removed 02/13 - MBS w/ SLP - NPO 02/14 - diet advanced to Dys 3, Thin liquids  Pt denies any nausea, vomiting, or pain from Cortrak tube.  Pt kept expressing that he wants the tube out and to go home.   RD checked to make sure pt is with assist on meal ordering.   Medications reviewed and include: Folic Acid, MVI, Thiamine, IV antibiotics Labs reviewed: Sodium 132, 24 hr CBG 51-124   Diet Order:   Diet Order             DIET DYS 3 Room service appropriate? Yes with Assist; Fluid consistency: Thin  Diet effective now                   EDUCATION NEEDS:   No education needs have been identified at this time  Skin:  Skin Assessment: Skin Integrity Issues: Skin Integrity Issues:: Unstageable Unstageable: Buttocks  Last BM:  2/13  Height:   Ht Readings from Last 1 Encounters:  01/05/22 '6\' 1"'  (1.854 m)    Weight:   Wt Readings from Last 1 Encounters:  01/18/22 78.9 kg    BMI:   Body mass index is 22.95 kg/m.  Estimated Nutritional Needs:   Kcal:  2100-2300  Protein:  115-130 grams  Fluid:  > 2 L/day    Roxana Hires, RD, LDN Clinical Dietitian See [facility] for contact information.

## 2022-01-19 NOTE — Progress Notes (Signed)
Modified Barium Swallow Progress Note  Patient Details  Name: Frederick [name] MRN: [id number] Date of Birth: May 21, 1961  Today's Date: 01/19/2022  Modified Barium Swallow completed.  Full report located under Chart Review in the Imaging Section.  Brief recommendations include the following:  Clinical Impression  Pt has mild oral and pharyngoesophageal dysphagia. His mastication takes some increased effort with regular solids and he has reduced lingual propulsion posteriorly with purees/solids. Premature spillage is noted with thin liquids and lingual residue is observed after consecutive straw sips, spilling into the valleculae after the swallow. Pharyngeal he has minimally reduced laryngeal closure that when paired with premature spillage results in trace penetration before the swallow that clears spontaneously either as he finishes that swallow or completes a second reflexive one (PAS 2). Pt also has reduced UES opening, with suspected osteophytes felt to be contributing to mild amounts of backflow that re-enter the pharynx. Removal of cortrak may also help with bolus flow, but during this study, penetration occurred only once as thin liquid backflow spilled posteriorly over the arytenoids. This also cleared spontaneously though and there was no aspiration across the study. Barium tablet also did not appear to exit the esophagus (MD not present to confirm). Recommend starting with Dys 3 diet and thin liquids.   Swallow Evaluation Recommendations       SLP Diet Recommendations: Dysphagia 3 (Mech soft) solids;Thin liquid   Liquid Administration via: Cup;Straw   Medication Administration: Whole meds with liquid   Supervision: Staff to assist with self feeding   Compensations: Slow rate;Small sips/bites;Minimize environmental distractions   Postural Changes: Seated upright at 90 degrees;Remain semi-upright after after feeds/meals (Comment)   Oral Care Recommendations: Oral care BID         Osie Bond., M.A. [place] Pager [phone] Office [phone]  01/19/2022,1:31 PM

## 2022-01-19 NOTE — Progress Notes (Signed)
PHARMACIST - PHYSICIAN COMMUNICATION  DR:   Elgergawy  CONCERNING: IV to Oral Route Change Policy  RECOMMENDATION: This patient is receiving thiamine by the intravenous route.  Based on criteria approved by the Pharmacy and Therapeutics Committee, the intravenous medication(s) is/are being converted to the equivalent oral dose form(s).   DESCRIPTION: These criteria include: The patient is eating (either orally or via tube) and/or has been taking other orally administered medications for a least 24 hours The patient has no evidence of active gastrointestinal bleeding or impaired GI absorption (gastrectomy, short bowel, patient on TNA or NPO).  If you have questions about this conversion, please contact the Pharmacy Department  []  ( [phone] )  [place] []  ( [phone] )  [facility] [x]  ( [phone]  [place] []  ( [phone]  [facility] []  ( [phone] )  [facility]      

## 2022-01-20 DIAGNOSIS — J984 Other disorders of lung: Secondary | ICD-10-CM

## 2022-01-20 LAB — GLUCOSE, CAPILLARY
Glucose-Capillary: 99 mg/dL (ref 70–99)
Glucose-Capillary: 121 mg/dL — ABNORMAL HIGH (ref 70–99)
Glucose-Capillary: 110 mg/dL — ABNORMAL HIGH (ref 70–99)
Glucose-Capillary: 106 mg/dL — ABNORMAL HIGH (ref 70–99)
Glucose-Capillary: 111 mg/dL — ABNORMAL HIGH (ref 70–99)

## 2022-01-20 LAB — BASIC METABOLIC PANEL
Anion gap: 10 (ref 5–15)
BUN: 19 mg/dL (ref 8–23)
CO2: 24 mmol/L (ref 22–32)
Calcium: 9.7 mg/dL (ref 8.9–10.3)
Chloride: 100 mmol/L (ref 98–111)
Creatinine, Ser: 0.82 mg/dL (ref 0.61–1.24)
GFR, Estimated: >60 mL/min (ref >60)
Glucose, Bld: 127 mg/dL — ABNORMAL HIGH (ref 70–99)
Potassium: 4.2 mmol/L (ref 3.5–5.1)
Sodium: 134 mmol/L — ABNORMAL LOW (ref 135–145)

## 2022-01-20 NOTE — NC FL2 (Signed)
[place] LEVEL OF CARE SCREENING TOOL     IDENTIFICATION  Patient Name: Frederick [name] Birthdate: [birth year] Sex: male Admission Date (Current Location): 01/05/2022  [facility] and [place] Number:  Herbalist and Address:  [facility], [place] [address], [place], [place] [address]      Provider Number: [id number]  Attending Physician Name and Address:  Jonetta Osgood, MD  Relative Name and Phone Number:       Current Level of Care: Hospital Recommended Level of Care: [place] Prior Approval Number:    Date Approved/Denied:   PASRR Number: [id number]  Discharge Plan: SNF    Current Diagnoses: Patient Active Problem List   Diagnosis Date Noted   Cerebral thrombosis with cerebral infarction 49/70/2637   Acute metabolic encephalopathy 85/88/5027   Protein-calorie malnutrition, severe 01/07/2022   Streptococcal bacteremia 09/09/2021   Alcohol withdrawal seizure with complication, with unspecified complication ([place]) 74/11/8785   CKD (chronic kidney disease), stage III ([place]) 08/19/2021   Dysphagia 08/19/2021   Malnutrition of moderate degree 08/12/2021   Bacteremia due to group B Streptococcus    Acute systolic CHF (congestive heart failure) ([place])    Seizures ([place]) 08/10/2021   Status epilepticus ([place])    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   Osteomyelitis of index finger of right hand ([place]) 05/28/2021   Marijuana abuse 07/22/2017   Hyponatremia 12/03/2015   Left rib fracture 11/29/2015   Microcytic anemia 11/29/2015   Cigarette smoker 11/29/2015   Moderate protein-calorie malnutrition ([place]) 11/29/2015   Unintentional weight loss 11/29/2015   History of tuberculosis 11/28/2015   Cavitary lesion of lung 11/28/2015   SBO (small bowel obstruction) ([place]) 03/10/2013   Abdominal pain, diffuse 03/10/2013   Nausea and vomiting 03/10/2013   Fracture of fibula, proximal 01/21/2013    Closed fracture of left distal distal tibia 01/21/2013   EtOH dependence ([place])    Fibula fracture    Hernia     Orientation RESPIRATION BLADDER Height & Weight     Self, Situation, Place  Normal Incontinent, External catheter Weight: 173 lb 15.1 oz (78.9 kg) Height:  '6\' 1"'  (185.4 cm)  BEHAVIORAL SYMPTOMS/MOOD NEUROLOGICAL BOWEL NUTRITION STATUS      Incontinent Diet (see dc summary)  AMBULATORY STATUS COMMUNICATION OF NEEDS Skin   Extensive Assist Verbally PU Stage and Appropriate Care (unstageable on buttocks)                       Personal Care Assistance Level of Assistance  Bathing, Feeding, Dressing Bathing Assistance: Maximum assistance Feeding assistance: Limited assistance Dressing Assistance: Limited assistance     Functional Limitations Info             SPECIAL CARE FACTORS FREQUENCY  PT (By licensed PT), OT (By licensed OT)     PT Frequency: 5x/week OT Frequency: 5x/week            Contractures Contractures Info: Not present    Additional Factors Info  Code Status, Allergies, Psychotropic Code Status Info: Full Allergies Info: NKA Psychotropic Info: Seroquel         Current Medications (01/20/2022):  This is the current hospital active medication list Current Facility-Administered Medications  Medication Dose Route Frequency Provider Last Rate Last Admin   acetaminophen (TYLENOL) 160 MG/5ML solution 650 mg  650 mg Per Tube Q6H PRN Anders Simmonds, MD   650 mg at 01/17/22 2045  aspirin chewable tablet 81 mg  81 mg Per Tube Daily Elgergawy, Silver Huguenin, MD   81 mg at 01/20/22 0947   atorvastatin (LIPITOR) tablet 40 mg  40 mg Oral Daily de Yolanda Manges, Cortney E, NP   40 mg at 01/20/22 0948   carvedilol (COREG) tablet 25 mg  25 mg Per Tube BID WC Elgergawy, Silver Huguenin, MD   25 mg at 01/20/22 0948   chlorhexidine gluconate (MEDLINE KIT) (PERIDEX) 0.12 % solution 15 mL  15 mL Mouth Rinse BID Kipp Brood, MD   15 mL at 01/20/22 0949   cilostazol  (PLETAL) tablet 100 mg  100 mg Oral BID Garvin Fila, MD   100 mg at 01/20/22 0948   collagenase (SANTYL) ointment   Topical BID Elgergawy, Silver Huguenin, MD   Given at 01/20/22 0946   cyanocobalamin ((VITAMIN B-12)) injection 1,000 mcg  1,000 mcg Intramuscular Daily Elgergawy, Silver Huguenin, MD   1,000 mcg at 01/20/22 1610   Followed by   Derrill Memo ON 01/26/2022] cyanocobalamin ((VITAMIN B-12)) injection 1,000 mcg  1,000 mcg Intramuscular Weekly Elgergawy, Silver Huguenin, MD       dextrose 10 % infusion   Intravenous Continuous Elgergawy, Silver Huguenin, MD 20 mL/hr at 01/19/22 1411 Rate Change at 01/19/22 1411   feeding supplement (PROSource TF) liquid 45 mL  45 mL Per Tube BID Kipp Brood, MD   45 mL at 01/20/22 0947   feeding supplement (VITAL 1.5 CAL) liquid 1,000 mL  1,000 mL Per Tube Continuous Kipp Brood, MD 60 mL/hr at 01/20/22 0324 1,000 mL at 96/04/54 0981   folic acid (FOLVITE) tablet 1 mg  1 mg Per Tube Daily Hunsucker, Bonna Gains, MD   1 mg at 01/20/22 0947   heparin injection 5,000 Units  5,000 Units Subcutaneous Q8H [place] Crane M, PA-C   5,000 Units at 01/20/22 0647   levETIRAcetam (KEPPRA) 100 MG/ML solution 1,000 mg  1,000 mg Per Tube Q12H Jose Persia, MD   1,000 mg at 01/20/22 0947   LORazepam (ATIVAN) injection 1-2 mg  1-2 mg Intravenous Q1H PRN Dorene Grebe, MD   2 mg at 01/18/22 0108   multivitamin with minerals tablet 1 tablet  1 tablet Oral Daily Elgergawy, Silver Huguenin, MD   1 tablet at 01/20/22 0946   nicotine (NICODERM CQ - dosed in mg/24 hours) patch 21 mg  21 mg Transdermal Daily Elgergawy, Silver Huguenin, MD   21 mg at 01/20/22 0950   QUEtiapine (SEROQUEL) tablet 25 mg  25 mg Per Tube QHS Elgergawy, Silver Huguenin, MD   25 mg at 01/19/22 2321   thiamine tablet 100 mg  100 mg Per Tube Daily Pham, Minh Q, RPH-CPP   100 mg at 01/20/22 1914     Discharge Medications: Please see discharge summary for a list of discharge medications.  Relevant Imaging Results:  Relevant Lab  Results:   Additional Information SSN: [id number]. No covid vaccines in system.  Benard Halsted, LCSW

## 2022-01-20 NOTE — Plan of Care (Signed)
?  Problem: Health Behavior/Discharge Planning: ?Goal: Ability to manage health-related needs will improve ?Outcome: Progressing ?  ?Problem: Clinical Measurements: ?Goal: Will remain free from infection ?Outcome: Progressing ?Goal: Respiratory complications will improve ?Outcome: Progressing ?  ?

## 2022-01-20 NOTE — Progress Notes (Signed)
PROGRESS NOTE        PATIENT DETAILS Name: Frederick [name] Age: 61 y.o. Sex: male Date of Birth: Jul 04, 1961 Admit Date: 01/05/2022 Admitting Physician Kipp Brood, MD PCP:[facility]  Brief Summary: Patient is a 61 y.o.  male with history of pulmonary tuberculosis, EtOH use, seizure disorder-brought to the ED with status epilepticus-intubated and admitted to the ICU.  See below for further details.  Significant Hospital events: 1/31>> to ED via EMS after seizure at home, persistently seizing in ED with focal seizure activity of L hand. Difficult to sedate, history of EtOH abuse/withdrawal. Intubated for airway protection. C/f ongoing seizure, Neuro consult, EEG ordered. 2/1>> Burst suppression pattern on EEG 2/2 >> Sedation weaned with no recurrence of clinical seizures.  2/3>> extubated 2/6 >> New fever. Precedex weaned off.  2/8 >> transferred to progressive unit under [facility] hospitalist. 2/12>> MRI brain showing small CVA 2/14>>MBS has been performed started on dysphagia diet.  Significant studies: 1/31>> CXR: Pleural-parenchymal scarring of right lung apex-multiple bilateral lung nodules as noted in prior studies 1/31>> CT head: No acute intracranial abnormality 2/12>> MRI brain: Acute infarct in the right cerebellum/subacute right frontal lobe infarcts 2/13>> CTA head/neck: No significant stenosis 2/13>> Echo: EF 55-60% 2/13>> A1c: 5.9 2/13>> LDL: 110  Significant microbiology data: 1/31>> COVID PCR: Positive 1/31>> influenza PCR: Negative 2/06>> tracheal aspirate culture: Klebsiella pneumoniae 2/06>> blood culture: Negative  Procedures: ETT 1/31>> 2/3  Consults:   Subjective: Lying comfortably in bed-denies any chest pain or shortness of breath.  Claims ate a sandwich for dinner, per nursing staff-875-80% of his breakfast this morning.  NG tube remains in place.  Objective: Vitals: Blood pressure (!) 89/67, pulse 89, temperature  98.4 F (36.9 C), temperature source Oral, resp. rate 19, height _0  (1.854 m), weight 78.9 kg, SpO2 92 %.   Exam: Gen Exam:Alert awake-not in any distress HEENT:atraumatic, normocephalic Chest: B/L clear to auscultation anteriorly CVS:S1S2 regular Abdomen:soft non tender, non distended Extremities:no edema Neurology: Non focal Skin: no rash  Pertinent Labs/Radiology: CBC Latest Ref Rng & Units 01/19/2022 01/18/2022 01/16/2022  WBC 4.0 - 10.5 K/uL 10.7(H) 9.6 7.8  Hemoglobin 13.0 - 17.0 g/dL 11.8(L) 12.4(L) 11.6(L)  Hematocrit 39.0 - 52.0 % 38.2(L) 39.4 37.3(L)  Platelets 150 - 400 K/uL 322 331 232    Lab Results  Component Value Date   NA 134 (L) 01/20/2022   K 4.2 01/20/2022   CL 100 01/20/2022   CO2 24 01/20/2022      Assessment/Plan: Status epilepticus: Resolved-stable on Keppra.  Seizure most likely provoked by noncompliance  Acute metabolic encephalopathy: Resolved-due to status epilepticus/hypoxia/ICU delirium.  Continue Seroquel.  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  Remains on high intensity statin.  Acute hypoxic respiratory failure: Resolved-this was in the setting of status epilepticus-and inability to maintain airway-extubated on 2/3.  Stable on room air.  Klebsiella pneumoniae PNA: Sputum culture positive for Klebsiella-has completed a course of antibiotics-afebrile-and without any leukocytosis.  History of chronic HFrEF: Euvolemic on exam-prior EF on 08/10/2021 was 25-30%, echo this admission with normalization of EF.    HTN: BP stable-continue Coreg.  History of DVT: Completed 3 months of treatment-on prophylaxis with SQ heparin.  GERD: Continue PPI  Dysphagia: Dysphagia 3 diet started on 2/14-seems to be tolerating it well-if he  continues to tolerate oral intake well-plan to remove cortak tube soon.  COVID-19 infection: Incidental-currently asymptomatic.  History of  asthma: Stable-continue bronchodilators  History of borderline B12 deficiency (B12 level 319 on 2/13): On supplementation.  Needs B12 supplementation on discharge.  History of pulmonary tuberculosis s/p treatment in 2016: Has chronic scarring/bronchiectasis on imaging that appears unchanged.  Debility/deconditioning: Due to acute illness-recommendations from PT/OT of SNF.  Nutrition Status: Nutrition Problem: Severe Malnutrition Etiology: chronic illness (TB, ETOH abuse) Signs/Symptoms: severe muscle depletion, severe fat depletion Interventions: MVI, Tube feeding, Prostat  Pressure Ulcer Pressure Injury 01/13/22 Buttocks Mid Unstageable - Full thickness tissue loss in which the base of the injury is covered by slough (yellow, tan, gray, green or brown) and/or eschar (tan, brown or black) in the wound bed. yellow (Active)  01/13/22 2000  Location: Buttocks  Location Orientation: Mid  Staging: Unstageable - Full thickness tissue loss in which the base of the injury is covered by slough (yellow, tan, gray, green or brown) and/or eschar (tan, brown or black) in the wound bed.  Wound Description (Comments): yellow  Present on Admission: No   BMI: Estimated body mass index is 22.95 kg/m as calculated from the following:   Height as of this encounter: _0  (1.854 m).   Weight as of this encounter: 78.9 kg.   Code status:   Code Status: Full Code   DVT Prophylaxis: heparin injection 5,000 Units Start: 01/06/22 0600 SCDs Start: 01/05/22 1821   Family Communication: None at bedside  Disposition Plan: Status is: Inpatient Remains inpatient appropriate because: Status epilepticus/acute CVA-resolving encephalopathy and dysphagia-not yet stable for discharge-still has NG tube in place.   Planned Discharge Destination:Skilled nursing facility   Diet: Diet Order             DIET DYS 3 Room service appropriate? Yes with Assist; Fluid consistency: Thin  Diet effective now                      Antimicrobial agents: Anti-infectives (From admission, onward)    Start     Dose/Rate Route Frequency Ordered Stop   01/15/22 1300  cefTRIAXone (ROCEPHIN) 2 g in sodium chloride 0.9 % 100 mL IVPB  Status:  Discontinued        2 g 200 mL/hr over 30 Minutes Intravenous Every 24 hours 01/15/22 1158 01/19/22 1354   01/11/22 1300  piperacillin-tazobactam (ZOSYN) IVPB 3.375 g  Status:  Discontinued        3.375 g 12.5 mL/hr over 240 Minutes Intravenous Every 8 hours 01/11/22 1159 01/15/22 1158        MEDICATIONS: Scheduled Meds:  aspirin  81 mg Per Tube Daily   atorvastatin  40 mg Oral Daily   carvedilol  25 mg Per Tube BID WC   chlorhexidine gluconate (MEDLINE KIT)  15 mL Mouth Rinse BID   cilostazol  100 mg Oral BID   collagenase   Topical BID   cyanocobalamin  1,000 mcg Intramuscular Daily   Followed by   Derrill Memo ON 01/26/2022] cyanocobalamin  1,000 mcg Intramuscular Weekly   feeding supplement (PROSource TF)  45 mL Per Tube BID   folic acid  1 mg Per Tube Daily   heparin  5,000 Units Subcutaneous Q8H   levETIRAcetam  1,000 mg Per Tube Q12H   multivitamin with minerals  1 tablet Oral Daily   nicotine  21 mg Transdermal Daily   QUEtiapine  25 mg Per Tube QHS   thiamine  100  mg Per Tube Daily   Continuous Infusions:  dextrose 20 mL/hr at 01/19/22 1411   feeding supplement (VITAL 1.5 CAL) 1,000 mL (01/20/22 0324)   PRN Meds:.acetaminophen (TYLENOL) oral liquid 160 mg/5 mL, LORazepam   I have personally reviewed following labs and imaging studies  LABORATORY DATA: CBC: Recent Labs  Lab 01/14/22 0357 01/16/22 0039 01/18/22 0948 01/19/22 0100  WBC 6.2 7.8 9.6 10.7*  HGB 12.1* 11.6* 12.4* 11.8*  HCT 37.9* 37.3* 39.4 38.2*  MCV 85.0 86.3 87.0 87.8  PLT 194 232 331 779    Basic Metabolic Panel: Recent Labs  Lab 01/14/22 0357 01/16/22 0039 01/18/22 0948 01/19/22 0100 01/19/22 0808 01/20/22 0806  NA 139 138 135 132*  --  134*  K 4.4 4.4 4.5  5.5* 4.3 4.2  CL 104 104 102 98  --  100  CO2 _0 --  24  GLUCOSE 128* 132* 114* 98  --  127*  BUN 28* 33* 25* 22  --  19  CREATININE 1.09 0.96 0.82 0.80  --  0.82  CALCIUM 9.0 9.0 9.4 9.3  --  9.7    GFR: Estimated Creatinine Clearance: 105.6 mL/min (by C-G formula based on SCr of 0.82 mg/dL).  Liver Function Tests: No results for input(s): AST, ALT, ALKPHOS, BILITOT, PROT, ALBUMIN in the last 168 hours. No results for input(s): LIPASE, AMYLASE in the last 168 hours. No results for input(s): AMMONIA in the last 168 hours.  Coagulation Profile: No results for input(s): INR, PROTIME in the last 168 hours.  Cardiac Enzymes: No results for input(s): CKTOTAL, CKMB, CKMBINDEX, TROPONINI in the last 168 hours.  BNP (last 3 results) No results for input(s): PROBNP in the last 8760 hours.  Lipid Profile: Recent Labs    01/18/22 0948  CHOL 160  HDL 27*  LDLCALC 110*  TRIG 114  CHOLHDL 5.9    Thyroid Function Tests: No results for input(s): TSH, T4TOTAL, FREET4, T3FREE, THYROIDAB in the last 72 hours.  Anemia Panel: Recent Labs    01/18/22 1438  VITAMINB12 319    Urine analysis:    Component Value Date/Time   COLORURINE YELLOW 01/11/2022 1008   APPEARANCEUR CLEAR 01/11/2022 1008   LABSPEC 1.020 01/11/2022 1008   PHURINE 6.0 01/11/2022 1008   GLUCOSEU NEGATIVE 01/11/2022 1008   HGBUR NEGATIVE 01/11/2022 1008   BILIRUBINUR NEGATIVE 01/11/2022 1008   KETONESUR NEGATIVE 01/11/2022 1008   PROTEINUR NEGATIVE 01/11/2022 1008   UROBILINOGEN 1.0 03/02/2015 1136   NITRITE POSITIVE (A) 01/11/2022 1008   LEUKOCYTESUR NEGATIVE 01/11/2022 1008    Sepsis Labs: Lactic Acid, Venous    Component Value Date/Time   LATICACIDVEN 0.8 11/26/2021 1056    MICROBIOLOGY: Recent Results (from the past 240 hour(s))  Culture, Respiratory w Gram Stain     Status: None   Collection Time: 01/11/22 10:26 AM   Specimen: Tracheal Aspirate; Respiratory  Result Value Ref Range  Status   Specimen Description TRACHEAL ASPIRATE  Final   Special Requests NONE  Final   Gram Stain   Final    FEW WBC PRESENT,BOTH PMN AND MONONUCLEAR FEW GRAM POSITIVE COCCI IN PAIRS FEW GRAM POSITIVE COCCI IN CHAINS FEW GRAM NEGATIVE COCCI FEW SQUAMOUS EPITHELIAL CELLS PRESENT    Culture   Final    FEW KLEBSIELLA PNEUMONIAE WITHIN NORMAL RESPIRATORY FLORA Performed at [facility], [place] [address], [place], [place] [address]    Report Status 01/13/2022 FINAL  Final   Organism ID, Bacteria KLEBSIELLA  PNEUMONIAE  Final      Susceptibility   Klebsiella pneumoniae - MIC*    AMPICILLIN >=32 RESISTANT Resistant     CEFAZOLIN <=4 SENSITIVE Sensitive     CEFEPIME <=0.12 SENSITIVE Sensitive     CEFTAZIDIME <=1 SENSITIVE Sensitive     CEFTRIAXONE <=0.25 SENSITIVE Sensitive     CIPROFLOXACIN <=0.25 SENSITIVE Sensitive     GENTAMICIN <=1 SENSITIVE Sensitive     IMIPENEM <=0.25 SENSITIVE Sensitive     TRIMETH/SULFA <=20 SENSITIVE Sensitive     AMPICILLIN/SULBACTAM 4 SENSITIVE Sensitive     PIP/TAZO <=4 SENSITIVE Sensitive     * FEW KLEBSIELLA PNEUMONIAE  MRSA Next Gen by PCR, Nasal     Status: None   Collection Time: 01/11/22 12:29 PM   Specimen: Nasal Mucosa; Nasal Swab  Result Value Ref Range Status   MRSA by PCR Next Gen NOT DETECTED NOT DETECTED Final    Comment: (NOTE) The GeneXpert MRSA Assay (FDA approved for NASAL specimens only), is one component of a comprehensive MRSA colonization surveillance program. It is not intended to diagnose MRSA infection nor to guide or monitor treatment for MRSA infections. Test performance is not FDA approved in patients less than 27 years old. Performed at [facility], [place] [address], [place], [place] [address]   Culture, blood (routine x 2)     Status: None   Collection Time: 01/11/22  1:11 PM   Specimen: BLOOD  Result Value Ref Range Status   Specimen Description BLOOD RIGHT ANTECUBITAL  Final   Special Requests   Final     BOTTLES DRAWN AEROBIC AND ANAEROBIC Blood Culture adequate volume   Culture   Final    NO GROWTH 5 DAYS Performed at [facility], [place] [address], [place], [place] [address]    Report Status 01/16/2022 FINAL  Final  Culture, blood (routine x 2)     Status: None   Collection Time: 01/11/22  1:11 PM   Specimen: BLOOD  Result Value Ref Range Status   Specimen Description BLOOD RIGHT ANTECUBITAL  Final   Special Requests   Final    BOTTLES DRAWN AEROBIC ONLY Blood Culture adequate volume   Culture   Final    NO GROWTH 5 DAYS Performed at [facility], [place] [address], [place], [place] [address]    Report Status 01/16/2022 FINAL  Final    RADIOLOGY STUDIES/RESULTS: DG Swallowing Func-Speech Pathology  Result Date: 01/19/2022 Table formatting from the original result was not included. Objective Swallowing Evaluation: Type of Study: MBS-Modified Barium Swallow Study  Patient Details Name: MATVEY LLANAS MRN: [id number] Date of Birth: 06/26/1961 Today's Date: 01/19/2022 Time: SLP Start Time (ACUTE ONLY): 0263 -SLP Stop Time (ACUTE ONLY): 1145 SLP Time Calculation (min) (ACUTE ONLY): 19 min Past Medical History: Past Medical History: Diagnosis Date  Arthritis   Asthma   Bilateral lower extremity edema 05/28/2021  propping feet up  Cough 05/28/2021  with clear sputum  Dyspnea   Dyspnea 05/28/2021  with exertion  EtOH dependence ([place])   drinks 2-3 of 40ounes a day  Fibula fracture   Dr Mardelle Matte 02-2011  Finger osteomyelitis, right ([place]) 05/26/2021  right index finger  Hernia   surgery 01-17-13  Hypertension   not taking bp meds since may 2022  Seizures ([place])   Streptococcal bacteremia 09/09/2021  Tuberculosis   couple of yrs ago took tx at [facility] per pt on 05-28-2021 Past Surgical History: Past Surgical History: Procedure  Laterality Date  AMPUTATION Right 05/29/2021  Procedure: PARTIAL RIGHT 2ND FINGER AMPUTATION;  Surgeon: Mcarthur Rossetti, MD;  Location: [facility];  Service: Orthopedics;  Laterality: Right;  ANKLE SURGERY    INGUINAL HERNIA REPAIR Right 01/19/2013  Procedure: HERNIA REPAIR INGUINAL ADULT;  Surgeon: Gayland Curry, MD,FACS;  Location: [facility];  Service: General;  Laterality: Right;  TEE WITHOUT CARDIOVERSION N/A 08/14/2021  Procedure: TRANSESOPHAGEAL ECHOCARDIOGRAM (TEE);  Surgeon: Josue Hector, MD;  Location: [facility] ENDOSCOPY;  Service: Cardiovascular;  Laterality: N/A;  TIBIA IM NAIL INSERTION Left 01/22/2013  Procedure: INTRAMEDULLARY (IM) NAIL TIBIAL;  Surgeon: Johnny Bridge, MD;  Location: [facility];  Service: Orthopedics;  Laterality: Left; HPI: Pt is a 61 year old male who presented to [facility] ED 1/31 via for seizures, concern for alcohol withdrawal. On arrival to ED, patient had ongoing focal left-sided seizure activity. He was loaded with Keppra and intubated for airway protection. ETT 1/31-2/3. EEG 2/2: severe diffuse encephalopathy. No seizures or epileptiform discharges. Pt found to have COVID-19. 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 PMH: HTN, asthma, tuberculosis (s/p treatment at [facility]), GERD, EtOH abuse, R finger osteomyelitis with strep bacteremia. Last seen by SLP September, 2022 with recommendation for regular texture solids and thin liquids  Subjective: more participatory today  Recommendations for follow up therapy are one component of a multi-disciplinary discharge planning process, led by the attending physician.  Recommendations may be updated based on patient status, additional functional criteria and insurance authorization. Assessment / Plan / Recommendation Clinical Impressions 01/19/2022 Clinical Impression Pt has mild oral and pharyngoesophageal dysphagia. His mastication takes some increased effort with regular solids and he has reduced lingual propulsion posteriorly with  purees/solids. Premature spillage is noted with thin liquids and lingual residue is observed after consecutive straw sips, spilling into the valleculae after the swallow. Pharyngeal he has minimally reduced laryngeal closure that when paired with premature spillage results in trace penetration before the swallow that clears spontaneously either as he finishes that swallow or completes a second reflexive one (PAS 2). Pt also has reduced UES opening, with suspected osteophytes felt to be contributing to mild amounts of backflow that re-enter the pharynx. Removal of cortrak may also help with bolus flow, but during this study, penetration occurred only once as thin liquid backflow spilled posteriorly over the arytenoids. This also cleared spontaneously though and there was no aspiration across the study. Barium tablet also did not appear to exit the esophagus (MD not present to confirm). Recommend starting with Dys 3 diet and thin liquids. SLP Visit Diagnosis Dysphagia, oropharyngeal phase (R13.12) Attention and concentration deficit following -- Frontal lobe and executive function deficit following -- Impact on safety and function Mild aspiration risk   Treatment Recommendations 01/19/2022 Treatment Recommendations Therapy as outlined in treatment plan below   Prognosis 01/19/2022 Prognosis for Safe Diet Advancement Good Barriers to Reach Goals Cognitive deficits Barriers/Prognosis Comment -- Diet Recommendations 01/19/2022 SLP Diet Recommendations Dysphagia 3 (Mech soft) solids;Thin liquid Liquid Administration via Cup;Straw Medication Administration Whole meds with liquid Compensations Slow rate;Small sips/bites;Minimize environmental distractions Postural Changes Seated upright at 90 degrees;Remain semi-upright after after feeds/meals (Comment)   Other Recommendations 01/19/2022 Recommended Consults -- Oral Care Recommendations Oral care BID Other Recommendations -- Follow Up Recommendations Skilled nursing-short term  rehab (<3 hours/day) Assistance recommended at discharge Frequent or constant Supervision/Assistance Functional  Status Assessment Patient has had a recent decline in their functional status and demonstrates the ability to make significant improvements in function in a reasonable and predictable amount of time. Frequency and Duration  01/19/2022 Speech Therapy Frequency (ACUTE ONLY) min 2x/week Treatment Duration 2 weeks   Oral Phase 01/19/2022 Oral Phase Impaired Oral - Pudding Teaspoon -- Oral - Pudding Cup -- Oral - Honey Teaspoon -- Oral - Honey Cup -- Oral - Nectar Teaspoon -- Oral - Nectar Cup -- Oral - Nectar Straw -- Oral - Thin Teaspoon -- Oral - Thin Cup Premature spillage Oral - Thin Straw Premature spillage;Lingual/palatal residue Oral - Puree Reduced posterior propulsion;Delayed oral transit Oral - Mech Soft -- Oral - Regular Impaired mastication Oral - Multi-Consistency -- Oral - Pill WFL Oral Phase - Comment --  Pharyngeal Phase 01/19/2022 Pharyngeal Phase Impaired Pharyngeal- Pudding Teaspoon -- Pharyngeal -- Pharyngeal- Pudding Cup -- Pharyngeal -- Pharyngeal- Honey Teaspoon -- Pharyngeal -- Pharyngeal- Honey Cup -- Pharyngeal -- Pharyngeal- Nectar Teaspoon -- Pharyngeal -- Pharyngeal- Nectar Cup -- Pharyngeal -- Pharyngeal- Nectar Straw -- Pharyngeal -- Pharyngeal- Thin Teaspoon -- Pharyngeal -- Pharyngeal- Thin Cup Penetration/Aspiration during swallow;Reduced airway/laryngeal closure Pharyngeal Material enters airway, remains ABOVE vocal cords then ejected out Pharyngeal- Thin Straw Reduced airway/laryngeal closure;Penetration/Aspiration during swallow;Penetration/Apiration after swallow Pharyngeal Material enters airway, remains ABOVE vocal cords then ejected out Pharyngeal- Puree WFL Pharyngeal -- Pharyngeal- Mechanical Soft -- Pharyngeal -- Pharyngeal- Regular WFL Pharyngeal -- Pharyngeal- Multi-consistency -- Pharyngeal -- Pharyngeal- Pill WFL Pharyngeal -- Pharyngeal Comment --  Cervical  Esophageal Phase  01/19/2022 Cervical Esophageal Phase Impaired Pudding Teaspoon -- Pudding Cup -- Honey Teaspoon -- Honey Cup -- Nectar Teaspoon -- Nectar Cup -- Nectar Straw -- Thin Teaspoon -- Thin Cup Reduced cricopharyngeal relaxation;Esophageal backflow into the pharynx Thin Straw Reduced cricopharyngeal relaxation;Esophageal backflow into the pharynx Puree Reduced cricopharyngeal relaxation;Esophageal backflow into the pharynx Mechanical Soft -- Regular Reduced cricopharyngeal relaxation;Esophageal backflow into the pharynx Multi-consistency -- Pill Reduced cricopharyngeal relaxation Cervical Esophageal Comment -- Osie Bond., M.A. CCC-SLP Acute Rehabilitation Services Pager [phone] Office [phone] 01/19/2022, 1:35 PM                     ECHOCARDIOGRAM COMPLETE  Result Date: 01/18/2022    ECHOCARDIOGRAM REPORT   Patient Name:   ELIO HADEN Date of Exam: 01/18/2022 Medical Rec #:  [id number]      Height:       73.0 in Accession #:    [id number]     Weight:       173.9 lb Date of Birth:  1961-05-27      BSA:          2.028 m Patient Age:    27 years       BP:           119/65 mmHg Patient Gender: M              HR:           91 bpm. Exam Location:  Inpatient Procedure: 2D Echo, Cardiac Doppler and Color Doppler Indications:    Stroke  History:        Patient has prior history of Echocardiogram examinations. CHF;                 Risk Factors:Current Smoker. ETOH abuse. CKD.  Sonographer:    Clayton Lefort RDCS (AE) Referring Phys: [id number] [place]  1. Left ventricular ejection fraction, by estimation, is 55 to  60%. The left ventricle has normal function. The left ventricle has no regional wall motion abnormalities. There is moderate asymmetric left ventricular hypertrophy of the basal-septal segment. Left ventricular diastolic parameters were normal.  2. Right ventricular systolic function is normal. The right ventricular size is normal. There is normal pulmonary artery systolic  pressure. The estimated right ventricular systolic pressure is 63.7 mmHg.  3. The mitral valve is normal in structure. Trivial mitral valve regurgitation.  4. The aortic valve was not well visualized. Aortic valve regurgitation is not visualized. No aortic stenosis is present.  5. The inferior vena cava is normal in size with greater than 50% respiratory variability, suggesting right atrial pressure of 3 mmHg. FINDINGS  Left Ventricle: Left ventricular ejection fraction, by estimation, is 55 to 60%. The left ventricle has normal function. The left ventricle has no regional wall motion abnormalities. The left ventricular internal cavity size was normal in size. There is  moderate asymmetric left ventricular hypertrophy of the basal-septal segment. Left ventricular diastolic parameters were normal. Right Ventricle: The right ventricular size is normal. Right vetricular wall thickness was not well visualized. Right ventricular systolic function is normal. There is normal pulmonary artery systolic pressure. The tricuspid regurgitant velocity is 2.28 m/s, and with an assumed right atrial pressure of 3 mmHg, the estimated right ventricular systolic pressure is 85.8 mmHg. Left Atrium: Left atrial size was normal in size. Right Atrium: Right atrial size was normal in size. Pericardium: There is no evidence of pericardial effusion. Mitral Valve: The mitral valve is normal in structure. Trivial mitral valve regurgitation. Tricuspid Valve: The tricuspid valve is normal in structure. Tricuspid valve regurgitation is trivial. Aortic Valve: The aortic valve was not well visualized. Aortic valve regurgitation is not visualized. No aortic stenosis is present. Aortic valve mean gradient measures 3.0 mmHg. Aortic valve peak gradient measures 5.6 mmHg. Aortic valve area, by VTI measures 3.77 cm. Pulmonic Valve: The pulmonic valve was not well visualized. Pulmonic valve regurgitation is not visualized. Aorta: The aortic root is normal  in size and structure. Venous: The inferior vena cava is normal in size with greater than 50% respiratory variability, suggesting right atrial pressure of 3 mmHg. IAS/Shunts: The interatrial septum was not well visualized.  LEFT VENTRICLE PLAX 2D LVIDd:         4.20 cm      Diastology LVIDs:         3.30 cm      LV e' medial:    9.15 cm/s LV PW:         2.20 cm      LV E/e' medial:  5.2 LV IVS:        2.30 cm      LV e' lateral:   13.40 cm/s LVOT diam:     2.40 cm      LV E/e' lateral: 3.5 LV SV:         72 LV SV Index:   36 LVOT Area:     4.52 cm  LV Volumes (MOD) LV vol d, MOD A2C: 82.0 ml LV vol d, MOD A4C: 103.0 ml LV vol s, MOD A2C: 37.3 ml LV vol s, MOD A4C: 41.4 ml LV SV MOD A2C:     44.7 ml LV SV MOD A4C:     103.0 ml LV SV MOD BP:      54.3 ml RIGHT VENTRICLE             IVC RV Basal diam:  2.70 cm  IVC diam: 1.20 cm RV S prime:     14.30 cm/s TAPSE (M-mode): 1.3 cm LEFT ATRIUM             Index        RIGHT ATRIUM           Index LA diam:        3.30 cm 1.63 cm/m   RA Area:     13.30 cm LA Vol (A2C):   38.6 ml 19.04 ml/m  RA Volume:   28.70 ml  14.15 ml/m LA Vol (A4C):   35.2 ml 17.36 ml/m LA Biplane Vol: 40.6 ml 20.02 ml/m  AORTIC VALVE AV Area (Vmax):    3.26 cm AV Area (Vmean):   3.28 cm AV Area (VTI):     3.77 cm AV Vmax:           118.00 cm/s AV Vmean:          79.300 cm/s AV VTI:            0.192 m AV Peak Grad:      5.6 mmHg AV Mean Grad:      3.0 mmHg LVOT Vmax:         85.10 cm/s LVOT Vmean:        57.500 cm/s LVOT VTI:          0.160 m LVOT/AV VTI ratio: 0.83  AORTA Ao Root diam: 3.80 cm MITRAL VALVE               TRICUSPID VALVE MV Area (PHT): 3.17 cm    TR Peak grad:   20.8 mmHg MV Decel Time: 239 msec    TR Vmax:        228.00 cm/s MV E velocity: 47.30 cm/s MV A velocity: 51.90 cm/s  SHUNTS MV E/A ratio:  0.91        Systemic VTI:  0.16 m                            Systemic Diam: 2.40 cm Oswaldo Milian MD Electronically signed by Oswaldo Milian MD Signature Date/Time:  01/18/2022/7:55:25 PM    Final      LOS: 15 days   Oren Binet, MD  [facility]    To contact the attending provider between 7A-7P or the covering provider during after hours 7P-7A, please log into the web site [email or web address] and access using universal  password for that web site. If you do not have the password, please call the hospital operator.  01/20/2022, 3:25 PM

## 2022-01-20 NOTE — Progress Notes (Signed)
Occupational Therapy Treatment Patient Details Name: Frederick [name] MRN: [id number] DOB: [birth year] Today's Date: 01/20/2022   History of present illness 61 year old man who presented to [facility] ED 1/31 via EMS for seizures, concern for alcohol withdrawal. 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   OT comments  Consistent progress towards OT goals remains limited by pt cognition. Pt alert, eating breakfast with L lateral lean on entry. Assisted pt in repositioning for safe self feeding position with noted coordination deficits noted when reaching for food items on tray. Pt initially very eager to get OOB though once OT set up environment for attempts, pt reported plan to drink coffee first so unable to attempt standing in [place] as planned today. Pt continues to have poor insight into situation, denies any physical ability concerns and reports ready to return home though has not demonstrated consistent OOB participation during admission. Will continue to follow and progress OOB ADLs within pt tolerance. Continue to strongly recommend SNF rehab prior to DC home. VSS on RA   Recommendations for follow up therapy are one component of a multi-disciplinary discharge planning process, led by the attending physician.  Recommendations may be updated based on patient status, additional functional criteria and insurance authorization.    Follow Up Recommendations  Skilled nursing-short term rehab (<3 hours/day)    Assistance Recommended at Discharge Frequent or constant Supervision/Assistance  Patient can return home with the following  A lot of help with bathing/dressing/bathroom;Assistance with cooking/housework;Direct supervision/assist for  medications management;Direct supervision/assist for financial management;Assist for transportation;Help with stairs or ramp for entrance;Two people to help with walking and/or transfers   Equipment Recommendations  Other (comment) (TBD pending progress)    Recommendations for Other Services      Precautions / Restrictions Precautions Precautions: Fall Precaution Comments: Cortrak Restrictions Weight Bearing Restrictions: No       Mobility Bed Mobility                    Transfers                         Balance                                           ADL either performed or assessed with clinical judgement   ADL Overall ADL's : Needs assistance/impaired Eating/Feeding: Set up;Bed level Eating/Feeding Details (indicate cue type and reason): to open coffee lid, able to open sugar packet by using mouth. assist to reposition in bed for optimal self feeding Grooming: Supervision/safety;Bed level;Wash/dry face                                 General ADL Comments: Limited primarily due to cognition and poor insight into deficits    Extremity/Trunk Assessment Upper Extremity Assessment Upper Extremity Assessment: LUE deficits/detail;RUE deficits/detail;Difficult to assess due to impaired cognition RUE Deficits / Details: noted poor coordination, undershooting/overshooting when reaching for items RUE Coordination: decreased fine motor;decreased gross motor LUE Deficits / Details: poor coordination LUE Coordination: decreased gross motor;decreased fine motor   Lower Extremity Assessment Lower Extremity  Assessment: Defer to PT evaluation        Vision   Vision Assessment?: No apparent visual deficits   Perception     Praxis      Cognition Arousal/Alertness: Awake/alert Behavior During Therapy: Flat affect, Restless Overall Cognitive Status: No family/caregiver present to determine baseline cognitive  functioning Area of Impairment: Attention, Following commands, Orientation, Safety/judgement, Awareness, Problem solving, Memory                   Current Attention Level: Sustained Memory: Decreased short-term memory Following Commands: Follows one step commands inconsistently, Follows one step commands with increased time Safety/Judgement: Decreased awareness of deficits Awareness: Intellectual Problem Solving: Requires verbal cues, Requires tactile cues General Comments: Pt with decreased insight into deficits, reports feeling fine, without deficits and ready to go home though when asked if he had been able to move about OOB much during admission, able to endorse no. Pt reports admission d/t seizure but reports no issues with seizures prior and did not have to take medications for them (per chart, pt noncompliant with meds). Initially eager for OOB tasks but became distracted by coffee and need to drink this first        Exercises      Shoulder Instructions       General Comments Pt reports need to eat more to be able to leave hospital per MD (OT also inquired that he would need to eat more to have cortrak removed as well). unable to voluntarily state need to move OOB, etc to also facilitate safe DC home    Pertinent Vitals/ Pain       Pain Assessment Pain Assessment: No/denies pain Pain Intervention(s): Monitored during session  Home Living                                          Prior Functioning/Environment              Frequency  Min 2X/week        Progress Toward Goals  OT Goals(current goals can now be found in the care plan section)  Progress towards OT goals: OT to reassess next treatment  Acute Rehab OT Goals Patient Stated Goal: go home now since he has no deficits OT Goal Formulation: With family Time For Goal Achievement: 01/24/22 Potential to Achieve Goals: Fair ADL Goals Pt Will Perform Grooming: with mod assist;bed  level Additional ADL Goal #1: Pt will follow 50% of 1 step commands to increase activity participation for ADL tasks  Plan Discharge plan remains appropriate    Co-evaluation                 AM-PAC OT "6 Clicks" Daily Activity     Outcome Measure   Help from another person eating meals?: A Little Help from another person taking care of personal grooming?: A Little Help from another person toileting, which includes using toliet, bedpan, or urinal?: Total Help from another person bathing (including washing, rinsing, drying)?: Total Help from another person to put on and taking off regular upper body clothing?: Total Help from another person to put on and taking off regular lower body clothing?: Total 6 Click Score: 10    End of Session    OT Visit Diagnosis: Other abnormalities of gait and mobility (R26.89);Muscle weakness (generalized) (M62.81);Other symptoms and signs involving cognitive function   Activity Tolerance Other (comment) (  limited by cognition)   Patient Left in bed;with call bell/phone within reach   Nurse Communication Mobility status;Other (comment) (cognition)        Time: 1014-1030 OT Time Calculation (min): 16 min  Charges: OT General Charges $OT Visit: 1 Visit OT Treatments $Self Care/Home Management : 8-22 mins  Malachy Chamber, OTR/L Acute Rehab Services Office: [phone]   Layla Maw 01/20/2022, 11:29 AM

## 2022-01-20 NOTE — Plan of Care (Signed)
°  Problem: Health Behavior/Discharge Planning: Goal: Ability to manage health-related needs will improve Outcome: Progressing   Problem: Clinical Measurements: Goal: Ability to maintain clinical measurements within normal limits will improve Outcome: Progressing Goal: Will remain free from infection Outcome: Progressing Goal: Diagnostic test results will improve Outcome: Progressing Goal: Respiratory complications will improve Outcome: Progressing Goal: Cardiovascular complication will be avoided Outcome: Progressing   Problem: Activity: Goal: Risk for activity intolerance will decrease Outcome: Progressing   Problem: Nutrition: Goal: Adequate nutrition will be maintained Outcome: Progressing   Problem: Coping: Goal: Level of anxiety will decrease Outcome: Progressing   Problem: Elimination: Goal: Will not experience complications related to bowel motility Outcome: Progressing Goal: Will not experience complications related to urinary retention Outcome: Progressing   Problem: Pain Managment: Goal: General experience of comfort will improve Outcome: Progressing   Problem: Safety: Goal: Ability to remain free from injury will improve Outcome: Progressing   Problem: Skin Integrity: Goal: Risk for impaired skin integrity will decrease Outcome: Progressing   Problem: Education: Goal: Knowledge of disease or condition will improve Outcome: Progressing Goal: Knowledge of secondary prevention will improve (SELECT ALL) Outcome: Progressing Goal: Knowledge of patient specific risk factors will improve (INDIVIDUALIZE FOR PATIENT) Outcome: Progressing   Problem: Coping: Goal: Will verbalize positive feelings about self Outcome: Progressing   Problem: Health Behavior/Discharge Planning: Goal: Ability to manage health-related needs will improve Outcome: Progressing   Problem: Self-Care: Goal: Ability to participate in self-care as condition permits will  improve Outcome: Progressing   Problem: Nutrition: Goal: Risk of aspiration will decrease Outcome: Progressing

## 2022-01-20 NOTE — Progress Notes (Signed)
Speech Language Pathology Treatment: Dysphagia  Patient Details Name: Frederick [name] MRN: [id number] DOB: 1961-03-07 Today's Date: 01/20/2022 Time: 1041-1100 SLP Time Calculation (min) (ACUTE ONLY): 19 min  Assessment / Plan / Recommendation Clinical Impression  Pt seen to address dysphagia and dysarthria goals.  Minimal cueing to allow [facility] to remain partially elevated - given concern for pharyngo=esophageal dysphagia and pill lodging at LES without clearance on MBS.  Reviewed MBS flouro loops with pt and called his wife per pt desire to review his study and educate her to precautions.   Pt with nearly 100% of breakfast consumed besides cream of wheat!  He denies coughing with intake - and declined to consume po with this SLP.    Pt demonstrates poor awareness to his dysarthria and physical deficits stating he "can go home" - but he is unable to hold the phone to talk to his wife.  His wife advises she has diabetes and can not even walk  - needing someone to push her in a wheelchair to get into the hospital.  At this time, if pt's mentation continues to be adequate - anticipate pt will be able to dc of small bore feeding tube.  Provided swallow precaution signs to pt and posted in room. Pt's dysarthria continues but he is intelligible - advised he turn off tv, etc to have conversations with people to improve listener environment.    Reviewed pt's cerebellar stroke but he denied this occurence stating "I didn't see it" - with teach back and review of swallowing/speech impacts from CVA, pt agreeable to occurence of CVA with max cues.  Uncertain to pt's baseline mentation given ETOH use - will follow up for dysphagia/dysarthria treatment and address orientation, awareness to help improve his rehab participation.    HPI HPI: Pt is a 61 year old male who presented to [facility] ED 1/31 via for seizures, concern for alcohol withdrawal. On arrival to ED, patient had ongoing focal left-sided seizure activity. He was  loaded with Keppra and intubated for airway protection. ETT 1/31-2/3. EEG 2/2: severe diffuse encephalopathy. No seizures or epileptiform discharges. Pt found to have COVID-19. 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 PMH: HTN, asthma, tuberculosis (s/p treatment at [facility]), GERD, EtOH abuse, R finger osteomyelitis with strep bacteremia. Last seen by SLP September, 2022 with recommendation for regular texture solids and thin liquids.      SLP Plan  Continue with current plan of care      Recommendations for follow up therapy are one component of a multi-disciplinary discharge planning process, led by the attending physician.  Recommendations may be updated based on patient status, additional functional criteria and insurance authorization.    Recommendations  Diet recommendations: Dysphagia 3 (mechanical soft);Thin liquid Medication Administration: Other (Comment) (with applesauce if pt will accept) Supervision: Intermittent supervision to cue for compensatory strategies Compensations: Slow rate;Small sips/bites;Minimize environmental distractions Postural Changes and/or Swallow Maneuvers: Seated upright 90 degrees;Upright 30-60 min after meal                Oral Care Recommendations: Oral care QID Follow Up Recommendations: Skilled nursing-short term rehab (<3 hours/day) Assistance recommended at discharge: Frequent or constant Supervision/Assistance SLP Visit Diagnosis: Dysphagia, oropharyngeal phase (R13.12) Plan: Continue with current plan of care           Veronda Gabor, Lise Auer, MS [facility] SLP Acute  Rehab Services Office [phone] Pager [phone]    01/20/2022, 11:23 AM

## 2022-01-21 LAB — BASIC METABOLIC PANEL
Anion gap: 11 (ref 5–15)
BUN: 23 mg/dL (ref 8–23)
CO2: 23 mmol/L (ref 22–32)
Calcium: 9.6 mg/dL (ref 8.9–10.3)
Chloride: 98 mmol/L (ref 98–111)
Creatinine, Ser: 0.96 mg/dL (ref 0.61–1.24)
GFR, Estimated: >60 mL/min (ref >60)
Glucose, Bld: 172 mg/dL — ABNORMAL HIGH (ref 70–99)
Potassium: 5.2 mmol/L — ABNORMAL HIGH (ref 3.5–5.1)
Sodium: 132 mmol/L — ABNORMAL LOW (ref 135–145)

## 2022-01-21 LAB — GLUCOSE, CAPILLARY
Glucose-Capillary: 112 mg/dL — ABNORMAL HIGH (ref 70–99)
Glucose-Capillary: 113 mg/dL — ABNORMAL HIGH (ref 70–99)
Glucose-Capillary: 126 mg/dL — ABNORMAL HIGH (ref 70–99)
Glucose-Capillary: 127 mg/dL — ABNORMAL HIGH (ref 70–99)
Glucose-Capillary: 142 mg/dL — ABNORMAL HIGH (ref 70–99)
Glucose-Capillary: 164 mg/dL — ABNORMAL HIGH (ref 70–99)
Glucose-Capillary: 182 mg/dL — ABNORMAL HIGH (ref 70–99)
Glucose-Capillary: 19 mg/dL — CL (ref 70–99)
Glucose-Capillary: 33 mg/dL — CL (ref 70–99)
Glucose-Capillary: 37 mg/dL — CL (ref 70–99)
Glucose-Capillary: 54 mg/dL — ABNORMAL LOW (ref 70–99)
Glucose-Capillary: 59 mg/dL — ABNORMAL LOW (ref 70–99)

## 2022-01-21 MED ORDER — DEXTROSE 50 % IV SOLN
INTRAVENOUS | Status: AC
  Administered 2022-01-21: 25 g via INTRAVENOUS
  Filled 2022-01-21: qty 50

## 2022-01-21 MED ORDER — SODIUM ZIRCONIUM CYCLOSILICATE 10 G PO PACK
10.0000 g | PACK | Freq: Once | ORAL | Status: AC
  Administered 2022-01-21: 10 g via ORAL
  Filled 2022-01-21: qty 1

## 2022-01-21 MED ORDER — DEXTROSE 50 % IV SOLN
25.0000 g | INTRAVENOUS | Status: AC
  Administered 2022-01-21: 25 g via INTRAVENOUS

## 2022-01-21 NOTE — Progress Notes (Signed)
PROGRESS NOTE        PATIENT DETAILS Name: Frederick [name] Age: 61 y.o. Sex: male Date of Birth: May 18, 1961 Admit Date: 01/05/2022 Admitting Physician Kipp Brood, MD PCP:[facility]  Brief Summary: Patient is a 61 y.o.  male with history of pulmonary tuberculosis, EtOH use, seizure disorder-brought to the ED with status epilepticus-intubated and admitted to the ICU.  See below for further details.  Significant Hospital events: 1/31>> to ED via EMS after seizure at home, persistently seizing in ED with focal seizure activity of L hand. Difficult to sedate, history of EtOH abuse/withdrawal. Intubated for airway protection. C/f ongoing seizure, Neuro consult, EEG ordered. 2/1>> Burst suppression pattern on EEG 2/2 >> Sedation weaned with no recurrence of clinical seizures.  2/3>> extubated 2/6 >> New fever. Precedex weaned off.  2/8 >> transferred to progressive unit under [facility]. 2/12>> MRI brain showing small CVA 2/14>>MBS has been performed started on dysphagia diet.  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  Had a few hypoglycemic episodes overnight-but this was felt to be an error-from poor capillary-per nursing staff-he was completely asymptomatic.  CBG this morning was at  142.  Objective: Vitals: Blood pressure 136/79, pulse 95, temperature 97.9 F (36.6 C), temperature source Oral, resp. rate 16, height '6\' 1"'  (1.854 m), weight 78.9 kg, SpO2 100 %.   Exam: Gen Exam: Not in any distress-slight confusion-but answers simple questions appropriately. HEENT:atraumatic, normocephalic Chest: B/L clear to auscultation anteriorly CVS:S1S2 regular Abdomen:soft non tender, non distended Extremities:no edema Neurology: Moves all 4 extremities symmetrically. Skin: no rash   Pertinent Labs/Radiology: CBC Latest Ref Rng & Units 01/19/2022 01/18/2022 01/16/2022  WBC 4.0 - 10.5 K/uL 10.7(H) 9.6 7.8  Hemoglobin 13.0 - 17.0 g/dL 11.8(L) 12.4(L) 11.6(L)  Hematocrit 39.0 - 52.0 % 38.2(L) 39.4 37.3(L)  Platelets 150 - 400 K/uL 322 331 232    Lab Results  Component Value Date   NA 132 (L) 01/21/2022   K 5.2 (H) 01/21/2022   CL 98 01/21/2022   CO2 23 01/21/2022      Assessment/Plan: Status epilepticus: Resolved-stable on Keppra.  Seizure most likely provoked by noncompliance  Acute metabolic encephalopathy: Improving-some mild confusion today-etiology felt to be status epilepticus/hypoxia/ICU delirium-remains on Seroquel.   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  Remains on high intensity statin.  Acute hypoxic respiratory failure: Resolved-this was in the setting of status epilepticus-and inability to maintain airway-extubated on 2/3.  Stable on room air.  Klebsiella pneumoniae PNA: Sputum culture positive for Klebsiella-has completed a course of antibiotics-afebrile-and without any leukocytosis.  Hyperkalemia: Mild-1 dose of Lokelma-repeat electrolytes tomorrow.  Hypoglycemia: Felt to be error- from poor capillary and fingertips-did not correlate with venous blood draw-CBG drawn from a warm fingertip this morning stable.  History of chronic HFrEF:  Euvolemic on exam-prior EF on  08/10/2021 was 25-30%, echo this admission with normalization of EF.    HTN: BP stable-continue Coreg.  History of DVT: Completed 3 months of treatment-on prophylaxis with SQ heparin.  GERD: Continue PPI  Dysphagia: Dysphagia 3 diet started on 2/14-Per nursing staff-eating around 60-70% of his meals-we will remove cortak tube today if he continues to do well with his meals.    COVID-19 infection: Incidental-currently asymptomatic.  History of asthma: Stable-continue bronchodilators  History of borderline B12 deficiency (B12 level 319 on 2/13): On supplementation.  Needs B12 supplementation on discharge.  History of pulmonary tuberculosis s/p treatment in 2016: Has chronic scarring/bronchiectasis on imaging that appears unchanged.  Debility/deconditioning: Due to acute illness-recommendations from PT/OT of SNF.  Nutrition Status: Nutrition Problem: Severe Malnutrition Etiology: chronic illness (TB, ETOH abuse) Signs/Symptoms: severe muscle depletion, severe fat depletion Interventions: MVI, Tube feeding, Prostat  Pressure Ulcer Pressure Injury 01/13/22 Buttocks Mid Unstageable - Full thickness tissue loss in which the base of the injury is covered by slough (yellow, tan, gray, green or brown) and/or eschar (tan, brown or black) in the wound bed. yellow (Active)  01/13/22 2000  Location: Buttocks  Location Orientation: Mid  Staging: Unstageable - Full thickness tissue loss in which the base of the injury is covered by slough (yellow, tan, gray, green or brown) and/or eschar (tan, brown or black) in the wound bed.  Wound Description (Comments): yellow  Present on Admission: No   BMI: Estimated body mass index is 22.95 kg/m as calculated from the following:   Height as of this encounter: '6\' 1"'  (1.854 m).   Weight as of this encounter: 78.9 kg.   Code status:   Code Status: Full Code   DVT Prophylaxis: heparin injection 5,000 Units Start: 01/06/22 0600 SCDs Start: 01/05/22  1821   Family Communication: Barrington Ellison (significant other) [phone]-called on 2/16-no response.  Disposition Plan: Status is: Inpatient Remains inpatient appropriate because: Status epilepticus/acute CVA-resolving encephalopathy and dysphagia-not yet stable for discharge-still has NG tube in place.   Planned Discharge Destination:Skilled nursing facility   Diet: Diet Order             DIET DYS 3 Room service appropriate? Yes with Assist; Fluid consistency: Thin  Diet effective now                     Antimicrobial agents: Anti-infectives (From admission, onward)    Start     Dose/Rate Route Frequency Ordered Stop   01/15/22 1300  cefTRIAXone (ROCEPHIN) 2 g in sodium chloride 0.9 % 100 mL IVPB  Status:  Discontinued        2 g 200 mL/hr over 30 Minutes Intravenous Every 24 hours 01/15/22 1158 01/19/22 1354   01/11/22 1300  piperacillin-tazobactam (ZOSYN) IVPB 3.375 g  Status:  Discontinued        3.375 g 12.5 mL/hr over 240 Minutes Intravenous Every 8 hours 01/11/22 1159 01/15/22 1158        MEDICATIONS: Scheduled Meds:  aspirin  81 mg Per Tube Daily   atorvastatin  40 mg Oral Daily   carvedilol  25 mg Per Tube BID WC   chlorhexidine gluconate (MEDLINE KIT)  15 mL Mouth Rinse BID   cilostazol  100 mg Oral BID   collagenase   Topical BID   cyanocobalamin  1,000 mcg Intramuscular Daily   Followed by   Derrill Memo ON 01/26/2022] cyanocobalamin  1,000 mcg Intramuscular Weekly   feeding supplement (PROSource TF)  45 mL Per Tube BID   folic acid  1 mg Per Tube Daily   heparin  5,000 Units Subcutaneous Q8H   levETIRAcetam  1,000 mg Per Tube Q12H   multivitamin with minerals  1 tablet Oral Daily   nicotine  21 mg Transdermal Daily   QUEtiapine  25 mg Per Tube QHS   thiamine  100 mg Per Tube Daily   Continuous Infusions:  dextrose 20 mL/hr at 01/21/22 0300   feeding supplement (VITAL 1.5 CAL) 1,000 mL (01/20/22 1950)   PRN Meds:.acetaminophen (TYLENOL) oral  liquid 160 mg/5 mL, LORazepam   I have personally reviewed following labs and imaging studies  LABORATORY DATA: CBC: Recent Labs  Lab 01/16/22 0039 01/18/22 0948 01/19/22 0100  WBC 7.8 9.6 10.7*  HGB 11.6* 12.4* 11.8*  HCT 37.3* 39.4 38.2*  MCV 86.3 87.0 87.8  PLT 232 331 322     Basic Metabolic Panel: Recent Labs  Lab 01/16/22 0039 01/18/22 0948 01/19/22 0100 01/19/22 0808 01/20/22 0806 01/21/22 0518  NA 138 135 132*  --  134* 132*  K 4.4 4.5 5.5* 4.3 4.2 5.2*  CL 104 102 98  --  100 98  CO2 '26 24 23  ' --  24 23  GLUCOSE 132* 114* 98  --  127* 172*  BUN 33* 25* 22  --  19 23  CREATININE 0.96 0.82 0.80  --  0.82 0.96  CALCIUM 9.0 9.4 9.3  --  9.7 9.6     GFR: Estimated Creatinine Clearance: 90.2 mL/min (by C-G formula based on SCr of 0.96 mg/dL).  Liver Function Tests: No results for input(s): AST, ALT, ALKPHOS, BILITOT, PROT, ALBUMIN in the last 168 hours. No results for input(s): LIPASE, AMYLASE in the last 168 hours. No results for input(s): AMMONIA in the last 168 hours.  Coagulation Profile: No results for input(s): INR, PROTIME in the last 168 hours.  Cardiac Enzymes: No results for input(s): CKTOTAL, CKMB, CKMBINDEX, TROPONINI in the last 168 hours.  BNP (last 3 results) No results for input(s): PROBNP in the last 8760 hours.  Lipid Profile: No results for input(s): CHOL, HDL, LDLCALC, TRIG, CHOLHDL, LDLDIRECT in the last 72 hours.   Thyroid Function Tests: No results for input(s): TSH, T4TOTAL, FREET4, T3FREE, THYROIDAB in the last 72 hours.  Anemia Panel: Recent Labs    01/18/22 1438  VITAMINB12 319     Urine analysis:    Component Value Date/Time   COLORURINE YELLOW 01/11/2022 1008   APPEARANCEUR CLEAR 01/11/2022 1008   LABSPEC 1.020 01/11/2022 1008   PHURINE 6.0 01/11/2022 1008   GLUCOSEU NEGATIVE 01/11/2022 1008   HGBUR NEGATIVE 01/11/2022 1008   BILIRUBINUR NEGATIVE 01/11/2022 1008   KETONESUR NEGATIVE 01/11/2022 1008    PROTEINUR NEGATIVE 01/11/2022 1008   UROBILINOGEN 1.0 03/02/2015 1136   NITRITE POSITIVE (A) 01/11/2022 1008   LEUKOCYTESUR NEGATIVE 01/11/2022 1008    Sepsis Labs: Lactic Acid, Venous    Component Value Date/Time   LATICACIDVEN 0.8 11/26/2021 1056    MICROBIOLOGY: Recent Results (from the past 240 hour(s))  Culture, Respiratory w Gram Stain     Status: None   Collection Time: 01/11/22 10:26 AM   Specimen: Tracheal Aspirate; Respiratory  Result Value Ref Range Status   Specimen Description TRACHEAL ASPIRATE  Final   Special Requests NONE  Final   Gram Stain   Final    FEW WBC PRESENT,BOTH PMN AND MONONUCLEAR FEW GRAM POSITIVE COCCI IN PAIRS FEW GRAM POSITIVE COCCI IN CHAINS FEW GRAM  NEGATIVE COCCI FEW SQUAMOUS EPITHELIAL CELLS PRESENT    Culture   Final    FEW KLEBSIELLA PNEUMONIAE WITHIN NORMAL RESPIRATORY FLORA Performed at [facility], [place] [address], [place], [place] [address]    Report Status 01/13/2022 FINAL  Final   Organism ID, Bacteria KLEBSIELLA PNEUMONIAE  Final      Susceptibility   Klebsiella pneumoniae - MIC*    AMPICILLIN >=32 RESISTANT Resistant     CEFAZOLIN <=4 SENSITIVE Sensitive     CEFEPIME <=0.12 SENSITIVE Sensitive     CEFTAZIDIME <=1 SENSITIVE Sensitive     CEFTRIAXONE <=0.25 SENSITIVE Sensitive     CIPROFLOXACIN <=0.25 SENSITIVE Sensitive     GENTAMICIN <=1 SENSITIVE Sensitive     IMIPENEM <=0.25 SENSITIVE Sensitive     TRIMETH/SULFA <=20 SENSITIVE Sensitive     AMPICILLIN/SULBACTAM 4 SENSITIVE Sensitive     PIP/TAZO <=4 SENSITIVE Sensitive     * FEW KLEBSIELLA PNEUMONIAE  MRSA Next Gen by PCR, Nasal     Status: None   Collection Time: 01/11/22 12:29 PM   Specimen: Nasal Mucosa; Nasal Swab  Result Value Ref Range Status   MRSA by PCR Next Gen NOT DETECTED NOT DETECTED Final    Comment: (NOTE) The GeneXpert MRSA Assay (FDA approved for NASAL specimens only), is one component of a comprehensive MRSA colonization  surveillance program. It is not intended to diagnose MRSA infection nor to guide or monitor treatment for MRSA infections. Test performance is not FDA approved in patients less than 26 years old. Performed at [facility], [place] [address], [place], [place] [address]   Culture, blood (routine x 2)     Status: None   Collection Time: 01/11/22  1:11 PM   Specimen: BLOOD  Result Value Ref Range Status   Specimen Description BLOOD RIGHT ANTECUBITAL  Final   Special Requests   Final    BOTTLES DRAWN AEROBIC AND ANAEROBIC Blood Culture adequate volume   Culture   Final    NO GROWTH 5 DAYS Performed at [facility], [place] [address], [place], [place] [address]    Report Status 01/16/2022 FINAL  Final  Culture, blood (routine x 2)     Status: None   Collection Time: 01/11/22  1:11 PM   Specimen: BLOOD  Result Value Ref Range Status   Specimen Description BLOOD RIGHT ANTECUBITAL  Final   Special Requests   Final    BOTTLES DRAWN AEROBIC ONLY Blood Culture adequate volume   Culture   Final    NO GROWTH 5 DAYS Performed at [facility], [place] [address] [place], [place] [address]    Report Status 01/16/2022 FINAL  Final    RADIOLOGY STUDIES/RESULTS: DG Swallowing Func-Speech Pathology  Result Date: 01/19/2022 Table formatting from the original result was not included. Objective Swallowing Evaluation: Type of Study: MBS-Modified Barium Swallow Study  Patient Details Name: Frederick [name] MRN: [id number] Date of Birth: 1961/04/05 Today's Date: 01/19/2022 Time: SLP Start Time (ACUTE ONLY): 1126 -SLP Stop Time (ACUTE ONLY): 1145 SLP Time Calculation (min) (ACUTE ONLY): 19 min Past Medical History: Past Medical History: Diagnosis Date  Arthritis   Asthma   Bilateral lower extremity edema 05/28/2021  propping feet up  Cough 05/28/2021  with clear sputum  Dyspnea   Dyspnea 05/28/2021  with exertion  EtOH dependence (HCC)   drinks 2-3 of 40ounes a day  Fibula fracture   Dr Mardelle Matte 02-2011   Finger osteomyelitis, right ([place]) 05/26/2021  right  index finger  Hernia   surgery 01-17-13  Hypertension   not taking bp meds since may 2022  Seizures ([place])   Streptococcal bacteremia 09/09/2021  Tuberculosis   couple of yrs ago took tx at [place] per pt on 05-28-2021 Past Surgical History: Past Surgical History: Procedure Laterality Date  AMPUTATION Right 05/29/2021  Procedure: PARTIAL RIGHT 2ND FINGER AMPUTATION;  Surgeon: Mcarthur Rossetti, MD;  Location: [facility];  Service: Orthopedics;  Laterality: Right;  ANKLE SURGERY    INGUINAL HERNIA REPAIR Right 01/19/2013  Procedure: HERNIA REPAIR INGUINAL ADULT;  Surgeon: Gayland Curry, MD,FACS;  Location: [facility];  Service: General;  Laterality: Right;  TEE WITHOUT CARDIOVERSION N/A 08/14/2021  Procedure: TRANSESOPHAGEAL ECHOCARDIOGRAM (TEE);  Surgeon: Josue Hector, MD;  Location: [facility] ENDOSCOPY;  Service: Cardiovascular;  Laterality: N/A;  TIBIA IM NAIL INSERTION Left 01/22/2013  Procedure: INTRAMEDULLARY (IM) NAIL TIBIAL;  Surgeon: Johnny Bridge, MD;  Location: [facility];  Service: Orthopedics;  Laterality: Left; HPI: Pt is a 61 year old male who presented to [facility] ED 1/31 via for seizures, concern for alcohol withdrawal. On arrival to ED, patient had ongoing focal left-sided seizure activity. He was loaded with Keppra and intubated for airway protection. ETT 1/31-2/3. EEG 2/2: severe diffuse encephalopathy. No seizures or epileptiform discharges. Pt found to have COVID-19. 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 PMH: HTN, asthma, tuberculosis (s/p treatment at [facility]), GERD, EtOH abuse, R finger osteomyelitis with strep bacteremia. Last seen by SLP September, 2022 with recommendation for regular texture solids and thin liquids  Subjective: more participatory today  Recommendations  for follow up therapy are one component of a multi-disciplinary discharge planning process, led by the attending physician.  Recommendations may be updated based on patient status, additional functional criteria and insurance authorization. Assessment / Plan / Recommendation Clinical Impressions 01/19/2022 Clinical Impression Pt has mild oral and pharyngoesophageal dysphagia. His mastication takes some increased effort with regular solids and he has reduced lingual propulsion posteriorly with purees/solids. Premature spillage is noted with thin liquids and lingual residue is observed after consecutive straw sips, spilling into the valleculae after the swallow. Pharyngeal he has minimally reduced laryngeal closure that when paired with premature spillage results in trace penetration before the swallow that clears spontaneously either as he finishes that swallow or completes a second reflexive one (PAS 2). Pt also has reduced UES opening, with suspected osteophytes felt to be contributing to mild amounts of backflow that re-enter the pharynx. Removal of cortrak may also help with bolus flow, but during this study, penetration occurred only once as thin liquid backflow spilled posteriorly over the arytenoids. This also cleared spontaneously though and there was no aspiration across the study. Barium tablet also did not appear to exit the esophagus (MD not present to confirm). Recommend starting with Dys 3 diet and thin liquids. SLP Visit Diagnosis Dysphagia, oropharyngeal phase (R13.12) Attention and concentration deficit following -- Frontal lobe and executive function deficit following -- Impact on safety and function Mild aspiration risk   Treatment Recommendations 01/19/2022 Treatment Recommendations Therapy as outlined in treatment plan below   Prognosis 01/19/2022 Prognosis for Safe Diet Advancement Good Barriers to Reach Goals Cognitive deficits Barriers/Prognosis Comment -- Diet Recommendations 01/19/2022 SLP Diet  Recommendations Dysphagia 3 (Mech soft) solids;Thin liquid Liquid Administration via Cup;Straw Medication Administration Whole meds with liquid Compensations  Slow rate;Small sips/bites;Minimize environmental distractions Postural Changes Seated upright at 90 degrees;Remain semi-upright after after feeds/meals (Comment)   Other Recommendations 01/19/2022 Recommended Consults -- Oral Care Recommendations Oral care BID Other Recommendations -- Follow Up Recommendations Skilled nursing-short term rehab (<3 hours/day) Assistance recommended at discharge Frequent or constant Supervision/Assistance Functional Status Assessment Patient has had a recent decline in their functional status and demonstrates the ability to make significant improvements in function in a reasonable and predictable amount of time. Frequency and Duration  01/19/2022 Speech Therapy Frequency (ACUTE ONLY) min 2x/week Treatment Duration 2 weeks   Oral Phase 01/19/2022 Oral Phase Impaired Oral - Pudding Teaspoon -- Oral - Pudding Cup -- Oral - Honey Teaspoon -- Oral - Honey Cup -- Oral - Nectar Teaspoon -- Oral - Nectar Cup -- Oral - Nectar Straw -- Oral - Thin Teaspoon -- Oral - Thin Cup Premature spillage Oral - Thin Straw Premature spillage;Lingual/palatal residue Oral - Puree Reduced posterior propulsion;Delayed oral transit Oral - Mech Soft -- Oral - Regular Impaired mastication Oral - Multi-Consistency -- Oral - Pill WFL Oral Phase - Comment --  Pharyngeal Phase 01/19/2022 Pharyngeal Phase Impaired Pharyngeal- Pudding Teaspoon -- Pharyngeal -- Pharyngeal- Pudding Cup -- Pharyngeal -- Pharyngeal- Honey Teaspoon -- Pharyngeal -- Pharyngeal- Honey Cup -- Pharyngeal -- Pharyngeal- Nectar Teaspoon -- Pharyngeal -- Pharyngeal- Nectar Cup -- Pharyngeal -- Pharyngeal- Nectar Straw -- Pharyngeal -- Pharyngeal- Thin Teaspoon -- Pharyngeal -- Pharyngeal- Thin Cup Penetration/Aspiration during swallow;Reduced airway/laryngeal closure Pharyngeal Material enters  airway, remains ABOVE vocal cords then ejected out Pharyngeal- Thin Straw Reduced airway/laryngeal closure;Penetration/Aspiration during swallow;Penetration/Apiration after swallow Pharyngeal Material enters airway, remains ABOVE vocal cords then ejected out Pharyngeal- Puree WFL Pharyngeal -- Pharyngeal- Mechanical Soft -- Pharyngeal -- Pharyngeal- Regular WFL Pharyngeal -- Pharyngeal- Multi-consistency -- Pharyngeal -- Pharyngeal- Pill WFL Pharyngeal -- Pharyngeal Comment --  Cervical Esophageal Phase  01/19/2022 Cervical Esophageal Phase Impaired Pudding Teaspoon -- Pudding Cup -- Honey Teaspoon -- Honey Cup -- Nectar Teaspoon -- Nectar Cup -- Nectar Straw -- Thin Teaspoon -- Thin Cup Reduced cricopharyngeal relaxation;Esophageal backflow into the pharynx Thin Straw Reduced cricopharyngeal relaxation;Esophageal backflow into the pharynx Puree Reduced cricopharyngeal relaxation;Esophageal backflow into the pharynx Mechanical Soft -- Regular Reduced cricopharyngeal relaxation;Esophageal backflow into the pharynx Multi-consistency -- Pill Reduced cricopharyngeal relaxation Cervical Esophageal Comment -- Osie Bond., M.A. [place] Pager [phone] Office [phone] 01/19/2022, 1:35 PM                       LOS: 16 days   Oren Binet, MD  [facility]    To contact the attending provider between 7A-7P or the covering provider during after hours 7P-7A, please log into the web site [email or web address] and access using universal [place] password for that web site. If you do not have the password, please call the hospital operator.  01/21/2022, 10:22 AM

## 2022-01-21 NOTE — Progress Notes (Signed)
Physical Therapy Treatment Patient Details Name: Frederick [name] MRN: [id number] DOB: October 30, 1961 Today's Date: 01/21/2022   History of Present Illness 61 year old man who presented to [facility] ED 1/31 via EMS for seizures, concern for alcohol withdrawal. COVID +; Intubated 01/05/22 for airway protection due to seizures; Cortak placed 01/08/22; extubated 01/08/22; agitation with ?alcohol withdrawal; acute CVA discovered 01/17/2022;  PMHx significant for HTN, asthma, tuberculosis), EtOH abuse, R finger osteomyelitis with strep bacteremia, seizures with ?med compliance.    PT Comments    Pt received in supine but impulsively sitting up in bed unassisted toward long sit near EOB, PTA entered room for safety as pt in danger of sliding between bed rails. Pt agreeable to limited therapy session, difficulty to redirect due to impulsivity and poor attention, following ~25-40% of simple commands. Pt needing +2 for safety and able to perform sit<>stand with modA +2 and squat pivot to recliner with +1 minA (pt impulsive to initiate and ignoring cues to wait for RW to be brought for him). Pt up in chair with alarm on for safety and defers further mobility at end of session. Pt continues to benefit from PT services to progress toward functional mobility goals.    Recommendations for follow up therapy are one component of a multi-disciplinary discharge planning process, led by the attending physician.  Recommendations may be updated based on patient status, additional functional criteria and insurance authorization.  Follow Up Recommendations  Skilled nursing-short term rehab (<3 hours/day)     Assistance Recommended at Discharge Frequent or constant Supervision/Assistance  Patient can return home with the following Two people to help with walking and/or transfers   Equipment Recommendations  Rolling walker (2 wheels);Wheelchair (measurements PT);Wheelchair cushion (measurements PT);Hospital bed    Recommendations for  Other Services       Precautions / Restrictions Precautions Precautions: Fall Precaution Comments: Cortrak Restrictions Weight Bearing Restrictions: No     Mobility  Bed Mobility Overal bed mobility: Needs Assistance Bed Mobility: Supine to Sit     Supine to sit: Min guard Sit to supine: Supervision   General bed mobility comments: min guard for safety and line assist, pt impulsively sits up/lays back down, cues for safety needed frequently    Transfers Overall transfer level: Needs assistance Equipment used: 2 person hand held assist, 1 person hand held assist Transfers: Sit to/from Stand, Bed to chair/wheelchair/BSC Sit to Stand: Mod assist, +2 physical assistance     Squat pivot transfers: Min assist     General transfer comment: +2 modA for upright posture with HHA standing and to redirect as pt impulsively reaching for recliner on his R side with LUE as if to dive into chair head-first. Cues for seated break, then after sitting pt impulsively squat pivots toward chair on R side with +1 assist while PTA turns back to grab his RW    Ambulation/Gait               General Gait Details: defer, pt decline after transfer to chair and poor command following today   Stairs             Wheelchair Mobility    Modified Rankin (Stroke Patients Only)       Balance Overall balance assessment: Needs assistance Sitting-balance support: Feet supported, Bilateral upper extremity supported Sitting balance-Leahy Scale: Fair Sitting balance - Comments: Very close guard for lines/leads and due to pt impulsivity   Standing balance support: Single extremity supported, Bilateral upper extremity supported Standing balance-Leahy Scale: Poor  Standing balance comment: poor more due to impulsivity and unsafe technique; STS with +2 HHA, difficult to assess dynamic balance due to limited participation                            Cognition Arousal/Alertness:  Awake/alert Behavior During Therapy: Flat affect, Restless, Impulsive Overall Cognitive Status: No family/caregiver present to determine baseline cognitive functioning Area of Impairment: Attention, Following commands, Orientation, Safety/judgement, Awareness, Problem solving, Memory                   Current Attention Level: Sustained Memory: Decreased short-term memory Following Commands: Follows one step commands inconsistently, Follows one step commands with increased time Safety/Judgement: Decreased awareness of deficits Awareness: Intellectual Problem Solving: Requires verbal cues, Requires tactile cues General Comments: Pt with decreased insight into deficits, reports feeling fine, pt extremely impulsive, stood from EOB x1 but poor posture, then stood impulsively while PTA grabbing RW for him and squat pivoted to chair with OT present to assist, not waiting for RW.        Exercises Other Exercises Other Exercises: seated BLE AROM: LAQ x 5 reps    General Comments General comments (skin integrity, edema, etc.): VSS per tele monitor, cortrak not running when PTA arrived to room and line disconnected for safety with transfer      Pertinent Vitals/Pain Pain Assessment Pain Assessment: No/denies pain Pain Intervention(s): Limited activity within patient's tolerance, Monitored during session, Repositioned    Home Living                          Prior Function            PT Goals (current goals can now be found in the care plan section) Acute Rehab PT Goals PT Goal Formulation: With patient Time For Goal Achievement: 01/24/22 Progress towards PT goals: Progressing toward goals    Frequency    Min 2X/week      PT Plan Current plan remains appropriate    Co-evaluation              AM-PAC PT "6 Clicks" Mobility   Outcome Measure  Help needed turning from your back to your side while in a flat bed without using bedrails?: A Little Help  needed moving from lying on your back to sitting on the side of a flat bed without using bedrails?: A Little Help needed moving to and from a bed to a chair (including a wheelchair)?: A Lot Help needed standing up from a chair using your arms (e.g., wheelchair or bedside chair)?: A Lot Help needed to walk in hospital room?: Total Help needed climbing 3-5 steps with a railing? : Total 6 Click Score: 12    End of Session Equipment Utilized During Treatment: Gait belt Activity Tolerance: Patient tolerated treatment well;Other (comment) (pt impulsivity and poor command following limiting mobility progression) Patient left: in chair;with call bell/phone within reach;with chair alarm set Nurse Communication: Mobility status;Other (comment) (+2 for safety with transfers) PT Visit Diagnosis: Muscle weakness (generalized) (M62.81);Difficulty in walking, not elsewhere classified (R26.2)     Time: 2162-4469 PT Time Calculation (min) (ACUTE ONLY): 12 min  Charges:  $Therapeutic Activity: 8-22 mins                     Pricila Bridge P., PTA Acute Rehabilitation Services Pager: [phone] Office: [phone]    Angus Palms 01/21/2022, 12:17 PM

## 2022-01-21 NOTE — Progress Notes (Signed)
Occupational Therapy Treatment Patient Details Name: Frederick [name] MRN: [id number] DOB: [birth year] Today's Date: 01/21/2022   History of present illness 61 year old man who presented to [facility] ED 1/31 via EMS for seizures, concern for alcohol withdrawal. COVID +; Intubated 01/05/22 for airway protection due to seizures; Cortak placed 01/08/22; extubated 01/08/22; agitation with ?alcohol withdrawal; acute CVA discovered 01/17/2022;  PMHx significant for HTN, asthma, tuberculosis), EtOH abuse, R finger osteomyelitis with strep bacteremia, seizures with ?med compliance.   OT comments  Performed visit with physical therapy for pt and therapist safety.  Pt discovered mobilizing to EOB with PTA with pt having initiated bed mobility and trying to position himself through the bed rails. Pt took some cues to verbalize his motivation to mobilize and pt did agree to move to chair. Purpose of visit was for safety and no OT goals directly addressed other than pre-bathroom mobility activities.  Pt continues to demonstrate good rehab potential and would benefit from continued skilled OT to increase safety and independence with ADLs and functional transfers to allow pt to return home safely and reduce caregiver burden and fall risk.     Recommendations for follow up therapy are one component of a multi-disciplinary discharge planning process, led by the attending physician.  Recommendations may be updated based on patient status, additional functional criteria and insurance authorization.    Follow Up Recommendations  Skilled nursing-short term rehab (<3 hours/day)    Assistance Recommended at Discharge Frequent or constant Supervision/Assistance  Patient can return home with the following  A lot of help with bathing/dressing/bathroom;Assistance with cooking/housework;Direct supervision/assist for medications management;Direct supervision/assist for financial management;Assist for transportation;Help with stairs or  ramp for entrance;Two people to help with walking and/or transfers   Equipment Recommendations  Other (comment)    Recommendations for Other Services      Precautions / Restrictions Precautions Precautions: Fall Precaution Comments: Cortrak Restrictions Weight Bearing Restrictions: No       Mobility Bed Mobility Overal bed mobility: Needs Assistance Bed Mobility: Supine to Sit     Supine to sit: Min guard          Transfers Overall transfer level: Needs assistance Equipment used: 2 person hand held assist, 1 person hand held assist Transfers: Sit to/from Stand, Bed to chair/wheelchair/BSC Sit to Stand: Mod assist, +2 physical assistance   Squat pivot transfers: Min assist       General transfer comment: +2 modA for upright posture with HHA standing and to redirect as pt impulsively reaching for recliner on his R side with LUE as if to dive into chair head-first. Cues for seated break, then after sitting pt impulsively squat pivots toward chair on R side with +1 assist while PTA turns back to grab his RW     Balance Overall balance assessment: Needs assistance Sitting-balance support: Feet supported, Bilateral upper extremity supported Sitting balance-Leahy Scale: Fair Sitting balance - Comments: Very close guard for lines/leads and due to pt impulsivity   Standing balance support: Single extremity supported, Bilateral upper extremity supported Standing balance-Leahy Scale: Poor Standing balance comment: poor more due to impulsivity and unsafe technique; STS with +2 HHA, difficult to assess dynamic balance due to limited participation                           ADL either performed or assessed with clinical judgement   ADL  Extremity/Trunk Assessment Upper Extremity Assessment Upper Extremity Assessment:  (Ataxic UEs at times when reaching for targets)            Vision        Perception     Praxis      Cognition Arousal/Alertness: Awake/alert Behavior During Therapy: Impulsive, Restless Overall Cognitive Status: No family/caregiver present to determine baseline cognitive functioning Area of Impairment: Attention, Following commands, Orientation, Safety/judgement, Awareness, Problem solving, Memory                       Following Commands: Follows one step commands inconsistently, Follows one step commands with increased time Safety/Judgement: Decreased awareness of deficits, Decreased awareness of safety Awareness: Intellectual   General Comments: Per PTA note: "Pt with decreased insight into deficits, reports feeling fine, pt extremely impulsive, stood from EOB x1 but poor posture, then stood impulsively while PTA grabbing RW for him and squat pivoted to chair with OT present to assist, not waiting for RW."        Exercises      Shoulder Instructions       General Comments VSS per tele monitor, cortrak not running when PTA arrived to room and line disconnected for safety with transfer    Pertinent Vitals/ Pain       Pain Assessment Pain Assessment: No/denies pain Pain Score: 0-No pain Faces Pain Scale: No hurt  Home Living                                          Prior Functioning/Environment              Frequency  Min 2X/week        Progress Toward Goals  OT Goals(current goals can now be found in the care plan section)  Progress towards OT goals: OT to reassess next treatment     Plan Discharge plan remains appropriate    Co-evaluation    PT/OT/SLP Co-Evaluation/Treatment: Yes Reason for Co-Treatment: Complexity of the patient's impairments (multi-system involvement);For patient/therapist safety;Necessary to address cognition/behavior during functional activity;To address functional/ADL transfers PT goals addressed during session: Mobility/safety with mobility OT goals addressed during  session: Other (comment) (Safety)      AM-PAC OT "6 Clicks" Daily Activity     Outcome Measure   Help from another person eating meals?: A Little Help from another person taking care of personal grooming?: A Little Help from another person toileting, which includes using toliet, bedpan, or urinal?: Total Help from another person bathing (including washing, rinsing, drying)?: Total Help from another person to put on and taking off regular upper body clothing?: Total Help from another person to put on and taking off regular lower body clothing?: Total 6 Click Score: 10    End of Session Equipment Utilized During Treatment: Gait belt  OT Visit Diagnosis: Other abnormalities of gait and mobility (R26.89);Muscle weakness (generalized) (M62.81);Other symptoms and signs involving cognitive function   Activity Tolerance Other (comment) (Limited by impaired cognition)   Patient Left in chair;with call bell/phone within reach;with chair alarm set   Nurse Communication Other (comment) (In chair)        [id number] OT Time Calculation (min): 10 min  Charges: OT General Charges $OT Visit: 1 Visit  Anderson Malta, [place] Office: [phone] 01/21/2022  Julien Girt 01/21/2022, 1:40 PM

## 2022-01-21 NOTE — TOC Progression Note (Signed)
Transition of Care [facility]) - Progression Note    Patient Details  Name: BRYKER FLETCHALL MRN: [id number] Date of Birth: [birth year]  Transition of [facility]) CM/SW Contact  Meelah Tallo Colman Cater, Student-Social Work Phone Number: 01/21/2022, 1:54 PM  Clinical Narrative:    CSW intern called the patients significant other Gillian Shields [phone]) to discuss discharge plans. CSW intern talked with Dois Davenport about the patients short/long term goals, she has decided on [facility] and would like to bring him home afterwards. CSW intern will follow up if any questions arise.   Expected Discharge Plan: Skilled Nursing Facility Barriers to Discharge: Continued Medical Work up, SNF Pending bed offer, English as a second language teacher  Expected Discharge Plan and Services Expected Discharge Plan: Skilled Nursing Facility       Living arrangements for the past 2 months: Apartment                                       Social Determinants of Health (SDOH) Interventions    Readmission Risk Interventions Readmission Risk Prevention Plan 01/12/2022  Transportation Screening Complete  Medication Review Oceanographer) Referral to Pharmacy  PCP or Specialist appointment within 3-5 days of discharge Not Complete  PCP/Specialist Appt Not Complete comments Patient not ready for d/c  HRI or Home Care Consult Not Complete  SW Recovery Care/Counseling Consult Complete  Palliative Care Screening Not Applicable  Skilled Nursing Facility Complete  Some recent data might be hidden

## 2022-01-21 NOTE — Progress Notes (Addendum)
Patient blood sugar 54 @ 0400 CBG check. Patient asymptomatic. Given 4oz of juice and an ensure shake given. Recheck of blood sugar was 22. Machine and strips changed, as well as capillary location. Recheck continued to read low. Patient asymptomatic, VSS, MD notified. Patient given another ensure and graham crackers D50. Awaiting lab draw for more accurate blood sugar.  Lab gave drop of venous blood draw for BG with glucometer. BG 164. At 0519  MD aware- see orders.

## 2022-01-22 DIAGNOSIS — M170 Bilateral primary osteoarthritis of knee: Secondary | ICD-10-CM | POA: Diagnosis not present

## 2022-01-22 DIAGNOSIS — Z8616 Personal history of COVID-19: Secondary | ICD-10-CM | POA: Diagnosis not present

## 2022-01-22 DIAGNOSIS — Z7401 Bed confinement status: Secondary | ICD-10-CM | POA: Diagnosis not present

## 2022-01-22 DIAGNOSIS — N183 Chronic kidney disease, stage 3 unspecified: Secondary | ICD-10-CM | POA: Diagnosis not present

## 2022-01-22 DIAGNOSIS — J9601 Acute respiratory failure with hypoxia: Secondary | ICD-10-CM | POA: Diagnosis not present

## 2022-01-22 DIAGNOSIS — K219 Gastro-esophageal reflux disease without esophagitis: Secondary | ICD-10-CM | POA: Diagnosis not present

## 2022-01-22 DIAGNOSIS — G9341 Metabolic encephalopathy: Secondary | ICD-10-CM | POA: Diagnosis not present

## 2022-01-22 DIAGNOSIS — L89150 Pressure ulcer of sacral region, unstageable: Secondary | ICD-10-CM | POA: Diagnosis not present

## 2022-01-22 DIAGNOSIS — I69811 Memory deficit following other cerebrovascular disease: Secondary | ICD-10-CM | POA: Diagnosis not present

## 2022-01-22 DIAGNOSIS — Z86718 Personal history of other venous thrombosis and embolism: Secondary | ICD-10-CM | POA: Diagnosis not present

## 2022-01-22 DIAGNOSIS — I6330 Cerebral infarction due to thrombosis of unspecified cerebral artery: Secondary | ICD-10-CM | POA: Diagnosis not present

## 2022-01-22 DIAGNOSIS — I10 Essential (primary) hypertension: Secondary | ICD-10-CM | POA: Diagnosis not present

## 2022-01-22 DIAGNOSIS — R569 Unspecified convulsions: Secondary | ICD-10-CM | POA: Diagnosis not present

## 2022-01-22 DIAGNOSIS — M6281 Muscle weakness (generalized): Secondary | ICD-10-CM | POA: Diagnosis not present

## 2022-01-22 DIAGNOSIS — E43 Unspecified severe protein-calorie malnutrition: Secondary | ICD-10-CM | POA: Diagnosis not present

## 2022-01-22 DIAGNOSIS — J984 Other disorders of lung: Secondary | ICD-10-CM | POA: Diagnosis not present

## 2022-01-22 DIAGNOSIS — I5020 Unspecified systolic (congestive) heart failure: Secondary | ICD-10-CM | POA: Diagnosis not present

## 2022-01-22 DIAGNOSIS — J45909 Unspecified asthma, uncomplicated: Secondary | ICD-10-CM | POA: Diagnosis not present

## 2022-01-22 DIAGNOSIS — I69891 Dysphagia following other cerebrovascular disease: Secondary | ICD-10-CM | POA: Diagnosis not present

## 2022-01-22 DIAGNOSIS — I69822 Dysarthria following other cerebrovascular disease: Secondary | ICD-10-CM | POA: Diagnosis not present

## 2022-01-22 DIAGNOSIS — Z1159 Encounter for screening for other viral diseases: Secondary | ICD-10-CM | POA: Diagnosis not present

## 2022-01-22 DIAGNOSIS — D509 Iron deficiency anemia, unspecified: Secondary | ICD-10-CM | POA: Diagnosis not present

## 2022-01-22 DIAGNOSIS — E875 Hyperkalemia: Secondary | ICD-10-CM | POA: Diagnosis not present

## 2022-01-22 LAB — GLUCOSE, CAPILLARY
Glucose-Capillary: 18 mg/dL — CL (ref 70–99)
Glucose-Capillary: 26 mg/dL — CL (ref 70–99)
Glucose-Capillary: 34 mg/dL — CL (ref 70–99)
Glucose-Capillary: <10 mg/dL — CL (ref 70–99)
Glucose-Capillary: 138 mg/dL — ABNORMAL HIGH (ref 70–99)
Glucose-Capillary: 109 mg/dL — ABNORMAL HIGH (ref 70–99)
Glucose-Capillary: 110 mg/dL — ABNORMAL HIGH (ref 70–99)
Glucose-Capillary: 111 mg/dL — ABNORMAL HIGH (ref 70–99)
Glucose-Capillary: 33 mg/dL — CL (ref 70–99)

## 2022-01-22 LAB — BASIC METABOLIC PANEL
Anion gap: 12 (ref 5–15)
BUN: 26 mg/dL — ABNORMAL HIGH (ref 8–23)
CO2: 24 mmol/L (ref 22–32)
Calcium: 9.7 mg/dL (ref 8.9–10.3)
Chloride: 99 mmol/L (ref 98–111)
Creatinine, Ser: 0.94 mg/dL (ref 0.61–1.24)
GFR, Estimated: >60 mL/min (ref >60)
Glucose, Bld: 130 mg/dL — ABNORMAL HIGH (ref 70–99)
Potassium: 4.6 mmol/L (ref 3.5–5.1)
Sodium: 135 mmol/L (ref 135–145)

## 2022-01-22 MED ORDER — QUETIAPINE FUMARATE 25 MG PO TABS: 25.0000 mg | ORAL_TABLET | Freq: Every day | ORAL | Status: DC

## 2022-01-22 MED ORDER — CILOSTAZOL 100 MG PO TABS: 100.0000 mg | ORAL_TABLET | Freq: Two times a day (BID) | ORAL | Status: DC

## 2022-01-22 MED ORDER — ATORVASTATIN CALCIUM 40 MG PO TABS: 40.0000 mg | ORAL_TABLET | Freq: Every day | ORAL | Status: DC

## 2022-01-22 MED ORDER — CARVEDILOL 25 MG PO TABS: 25.0000 mg | ORAL_TABLET | Freq: Two times a day (BID) | ORAL | Status: DC

## 2022-01-22 MED ORDER — CYANOCOBALAMIN 100 MCG PO TABS: 500.0000 ug | ORAL_TABLET | Freq: Every day | ORAL | 3 refills | Status: DC

## 2022-01-22 MED ORDER — ASPIRIN 81 MG PO CHEW: 81.0000 mg | CHEWABLE_TABLET | Freq: Every day | ORAL | Status: DC

## 2022-01-22 NOTE — TOC Progression Note (Signed)
Transition of Care [facility]) - Progression Note    Patient Details  Name: Frederick [name] MRN: [id number] Date of Birth: 1961-06-28  Transition of Care [facility]) CM/SW [place], LCSW Phone Number: 01/22/2022, 10:45 AM  Clinical Narrative:    Insurance approval received for [employer] once medically stable: Ref# [id number] , effective 01/22/2022-01/26/2022.   Expected Discharge Plan: Skilled Nursing Facility Barriers to Discharge: Continued Medical Work up  Expected Discharge Plan and Services Expected Discharge Plan: [place] arrangements for the past 2 months: Apartment                                       Social Determinants of Health (SDOH) Interventions    Readmission Risk Interventions Readmission Risk Prevention Plan 01/12/2022  Transportation Screening Complete  Medication Review Press photographer) Referral to Pharmacy  PCP or Specialist appointment within 3-5 days of discharge Not Complete  PCP/Specialist Appt Not Complete comments Patient not ready for d/c  HRI or [place] Not Complete  SW Recovery Care/Counseling Consult Complete  Palliative Care Screening Not Applicable  Skilled Nursing Facility Complete  Some recent data might be hidden

## 2022-01-22 NOTE — Plan of Care (Signed)
°  Problem: Health Behavior/Discharge Planning: Goal: Ability to manage health-related needs will improve Outcome: Adequate for Discharge   Problem: Clinical Measurements: Goal: Ability to maintain clinical measurements within normal limits will improve Outcome: Adequate for Discharge Goal: Will remain free from infection Outcome: Adequate for Discharge Goal: Diagnostic test results will improve Outcome: Adequate for Discharge Goal: Respiratory complications will improve Outcome: Adequate for Discharge Goal: Cardiovascular complication will be avoided Outcome: Adequate for Discharge   Problem: Activity: Goal: Risk for activity intolerance will decrease Outcome: Adequate for Discharge   Problem: Nutrition: Goal: Adequate nutrition will be maintained Outcome: Adequate for Discharge   Problem: Coping: Goal: Level of anxiety will decrease Outcome: Adequate for Discharge   Problem: Elimination: Goal: Will not experience complications related to bowel motility Outcome: Adequate for Discharge Goal: Will not experience complications related to urinary retention Outcome: Adequate for Discharge   Problem: Pain Managment: Goal: General experience of comfort will improve Outcome: Adequate for Discharge   Problem: Safety: Goal: Ability to remain free from injury will improve Outcome: Adequate for Discharge   Problem: Skin Integrity: Goal: Risk for impaired skin integrity will decrease Outcome: Adequate for Discharge   Problem: Education: Goal: Knowledge of disease or condition will improve Outcome: Adequate for Discharge Goal: Knowledge of secondary prevention will improve (SELECT ALL) Outcome: Adequate for Discharge Goal: Knowledge of patient specific risk factors will improve (INDIVIDUALIZE FOR PATIENT) Outcome: Adequate for Discharge   Problem: Coping: Goal: Will verbalize positive feelings about self Outcome: Adequate for Discharge   Problem: Health Behavior/Discharge  Planning: Goal: Ability to manage health-related needs will improve Outcome: Adequate for Discharge   Problem: Self-Care: Goal: Ability to participate in self-care as condition permits will improve Outcome: Adequate for Discharge   Problem: Nutrition: Goal: Risk of aspiration will decrease Outcome: Adequate for Discharge

## 2022-01-22 NOTE — Discharge Summary (Signed)
PATIENT DETAILS Name: Frederick [name] Age: 61 y.o. Sex: male Date of Birth: 12-08-1960 MRN: [id number]. Admitting Physician: Kipp Brood, MD PCP:[facility]  Admit Date: 01/05/2022 Discharge date: 01/22/2022  Recommendations for Outpatient Follow-up:  Follow up with PCP in 1-2 weeks Please obtain CMP/CBC in one week  Admitted From:  Home  Disposition: Skilled nursing facility   Discharge Condition: fair  CODE STATUS:   Code Status: Full Code   Diet recommendation:  Diet Order             Diet - low sodium heart healthy           DIET DYS 3 Room service appropriate? Yes with Assist; Fluid consistency: Thin  Diet effective now                    Brief Summary: Patient is a 61 y.o.  male with history of pulmonary tuberculosis, EtOH use, seizure disorder-brought to the ED with status epilepticus-intubated and admitted to the ICU.  See below for further details.   Significant Hospital events: 1/31>> to ED via EMS after seizure at home, persistently seizing in ED with focal seizure activity of L hand. Difficult to sedate, history of EtOH abuse/withdrawal. Intubated for airway protection. C/f ongoing seizure, Neuro consult, EEG ordered. 2/1>> Burst suppression pattern on EEG 2/2 >> Sedation weaned with no recurrence of clinical seizures.  2/3>> extubated 2/6 >> New fever. Precedex weaned off.  2/8 >> transferred to progressive unit under [facility] hospitalist. 2/12>> MRI brain showing small CVA 2/14>>MBS has been performed started on dysphagia diet.   Significant studies: 1/31>> CXR: Pleural-parenchymal scarring of right lung apex-multiple bilateral lung nodules as noted in prior studies 1/31>> CT head: No acute intracranial abnormality 2/12>> MRI brain: Acute infarct in the right cerebellum/subacute right frontal lobe infarcts 2/13>> CTA head/neck: No significant stenosis 2/13>> Echo: EF 55-60% 2/13>> A1c: 5.9 2/13>> LDL: 110   Significant microbiology  data: 1/31>> COVID PCR: Positive 1/31>> influenza PCR: Negative 2/06>> tracheal aspirate culture: Klebsiella pneumoniae 2/06>> blood culture: Negative   Procedures: ETT 1/31>> 2/3   Consults:  Neurology PCCM  Brief Hospital Course: Status epilepticus: Resolved-stable on Keppra.  Seizure most likely provoked by noncompliance.  Patient aware of driving restrictions.  Please ensure outpatient follow-up with neurology.   Acute metabolic encephalopathy: Improving-some intermittent delirium/confusion-but overall much better.  Etiology felt to be status epilepticus/hypoxia/ICU delirium-remains on Seroquel.    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  Remains on high intensity statin.   Acute hypoxic respiratory failure: Resolved-this was in the setting of status epilepticus-and inability to maintain airway-extubated on 2/3.  Stable on room air.   Klebsiella pneumoniae PNA: Sputum culture positive for Klebsiella-has completed a course of antibiotics-afebrile-and without any leukocytosis.   Hyperkalemia: Resolved after 1 dose of Lokelma.   Hypoglycemia: Felt to be error- from poor capillary and fingertips-did not correlate with venous blood draw-CBG drawn from a warm fingertip this morning stable.   History of chronic HFrEF: Euvolemic on exam-prior EF on 08/10/2021 was 25-30%, echo this admission with normalization of EF.     HTN: BP stable-continue Coreg.  History of DVT: Completed 3 months of treatment-on prophylaxis with SQ heparin.   GERD: Continue PPI   Dysphagia: Per nursing staff-eating anywhere from 60-90% of his meals-NG tube discontinued.  Continue dysphagia 3 diet on discharge-please ensure SLP follow-up at SNF.     COVID-19 infection: Incidental-currently  asymptomatic.  Completed more than 10 days of isolation.   History of asthma: Stable-continue bronchodilators   History of borderline B12  deficiency (B12 level 319 on 2/13): On supplementation.  Needs B12 supplementation on discharge.   History of pulmonary tuberculosis s/p treatment in 2016: Has chronic scarring/bronchiectasis on imaging that appears unchanged.   Debility/deconditioning: Due to acute illness-recommendations from PT/OT of SNF.   Nutrition Status: Nutrition Problem: Severe Malnutrition Etiology: chronic illness (TB, ETOH abuse) Signs/Symptoms: severe muscle depletion, severe fat depletion Interventions: MVI, Tube feeding, Prostat   Pressure Ulcer Pressure Injury 01/13/22 Buttocks Mid Unstageable - Full thickness tissue loss in which the base of the injury is covered by slough (yellow, tan, gray, green or brown) and/or eschar (tan, brown or black) in the wound bed. yellow (Active)  01/13/22 2000  Location: Buttocks  Location Orientation: Mid  Staging: Unstageable - Full thickness tissue loss in which the base of the injury is covered by slough (yellow, tan, gray, green or brown) and/or eschar (tan, brown or black) in the wound bed.  Wound Description (Comments): yellow  Present on Admission: No    BMI: Estimated body mass index is 22.95 kg/m as calculated from the following:   Height as of this encounter: 6\' 1"  (1.854 m).   Weight as of this encounter: 78.9 kg.   Discharge Diagnoses:  Principal Problem:   Status epilepticus ([place]) Active Problems:   EtOH dependence ([place])   Cavitary lesion of lung   CKD (chronic kidney disease), stage III ([facility])   Protein-calorie malnutrition, severe   Acute metabolic encephalopathy   Cerebral thrombosis with cerebral infarction   Discharge Instructions:  Activity:  As tolerated with Full fall precautions use walker/cane & assistance as needed  Discharge Instructions     Ambulatory referral to Neurology   Complete by: As directed    An appointment is requested in approximately: 4 weeks   Diet - low sodium heart healthy   Complete by: As directed     Discharge diet:   Complete by: As directed    Diet recommendations: Dysphagia 3 (mechanical soft);Thin liquid Medication Administration: Other (Comment) (with applesauce if pt will accept) Supervision: Intermittent supervision to cue for compensatory strategies Compensations: Slow rate;Small sips/bites;Minimize environmental distractions Postural Changes and/or Swallow Maneuvers: Seated upright 90 degrees;Upright 30-60 min after meal   Discharge instructions   Complete by: As directed    Follow with Primary MD  [facility] in 1-2 weeks  Please get a complete blood count and chemistry panel checked by your Primary MD at your next visit, and again as instructed by your Primary MD.  Get Medicines reviewed and adjusted: Please take all your medications with you for your next visit with your Primary MD  Laboratory/radiological data: Please request your Primary MD to go over all hospital tests and procedure/radiological results at the follow up, please ask your Primary MD to get all Hospital records sent to his/her office.  In some cases, they will be blood work, cultures and biopsy results pending at the time of your discharge. Please request that your primary care M.D. follows up on these results.  Also Note the following: If you experience worsening of your admission symptoms, develop shortness of breath, life threatening emergency, suicidal or homicidal thoughts you must seek medical attention immediately by calling 911 or calling your MD immediately  if symptoms less severe.  You must read complete instructions/literature along with all the possible adverse reactions/side effects for all the Medicines you take  and that have been prescribed to you. Take any new Medicines after you have completely understood and accpet all the possible adverse reactions/side effects.   Do not drive when taking Pain medications or sleeping medications (Benzodaizepines)  Do not take more than prescribed  Pain, Sleep and Anxiety Medications. It is not advisable to combine anxiety,sleep and pain medications without talking with your primary care practitioner  Special Instructions: If you have smoked or chewed Tobacco  in the last 2 yrs please stop smoking, stop any regular Alcohol  and or any Recreational drug use.  Wear Seat belts while driving.  Please note: You were cared for by a hospitalist during your hospital stay. Once you are discharged, your primary care physician will handle any further medical issues. Please note that NO REFILLS for any discharge medications will be authorized once you are discharged, as it is imperative that you return to your primary care physician (or establish a relationship with a primary care physician if you do not have one) for your post hospital discharge needs so that they can reassess your need for medications and monitor your lab values.   Seizure precautions: Per [facility] statutes, patients with seizures are not allowed to drive until they have been seizure-free for six months and cleared by a physician    Use caution when using heavy equipment or power tools. Avoid working on ladders or at heights. Take showers instead of baths. Ensure the water temperature is not too high on the home water heater. Do not go swimming alone. Do not lock yourself in a room alone (i.e. bathroom). When caring for infants or small children, sit down when holding, feeding, or changing them to minimize risk of injury to the child in the event you have a seizure. Maintain good sleep hygiene. Avoid alcohol.    If patient has another seizure, call 911 and bring them back to the ED if: A.  The seizure lasts longer than 5 minutes.      B.  The patient doesn't wake shortly after the seizure or has new problems such as difficulty seeing, speaking or moving following the seizure C.  The patient was injured during the seizure D.  The patient has a temperature over 102 F (39C) E.   The patient vomited during the seizure and now is having trouble breathing    During the Seizure   - First, ensure adequate ventilation and place patients on the floor on their left side  Loosen clothing around the neck and ensure the airway is patent. If the patient is clenching the teeth, do not force the mouth open with any object as this can cause severe damage - Remove all items from the surrounding that can be hazardous. The patient may be oblivious to what's happening and may not even know what he or she is doing. If the patient is confused and wandering, either gently guide him/her away and block access to outside areas - Reassure the individual and be comforting - Call 911. In most cases, the seizure ends before EMS arrives. However, there are cases when seizures may last over 3 to 5 minutes. Or the individual may have developed breathing difficulties or severe injuries. If a pregnant patient or a person with diabetes develops a seizure, it is prudent to call an ambulance. - Finally, if the patient does not regain full consciousness, then call EMS. Most patients will remain confused for about 45 to 90 minutes after a seizure, so you must  use judgment in calling for help. - Avoid restraints but make sure the patient is in a bed with padded side rails - Place the individual in a lateral position with the neck slightly flexed; this will help the saliva drain from the mouth and prevent the tongue from falling backward - Remove all nearby furniture and other hazards from the area - Provide verbal assurance as the individual is regaining consciousness - Provide the patient with privacy if possible - Call for help and start treatment as ordered by the caregiver    After the Seizure (Postictal Stage)   After a seizure, most patients experience confusion, fatigue, muscle pain and/or a headache. Thus, one should permit the individual to sleep. For the next few days, reassurance is essential. Being  calm and helping reorient the person is also of importance.   Most seizures are painless and end spontaneously. Seizures are not harmful to others but can lead to complications such as stress on the lungs, brain and the heart. Individuals with prior lung problems may develop labored breathing and respiratory distress.   Discharge wound care:   Complete by: As directed    Apply Santyl to intergluteal cleft pressure injury in a nickel thick layer followed by a moistened saline gauze, dry gauze and sacral foam dressing placed upside down to prevent soiling. Apply twice daily.   Increase activity slowly   Complete by: As directed       Allergies as of 01/22/2022   No Known Allergies      Medication List     STOP taking these medications    apixaban 5 MG Tabs tablet Commonly known as: ELIQUIS   gabapentin 100 MG capsule Commonly known as: NEURONTIN   saccharomyces boulardii 250 MG capsule Commonly known as: FLORASTOR       TAKE these medications    albuterol 108 (90 Base) MCG/ACT inhaler Commonly known as: VENTOLIN HFA Inhale 2 puffs into the lungs every 4 (four) hours as needed for shortness of breath or wheezing.   aspirin 81 MG chewable tablet Place 1 tablet (81 mg total) into feeding tube daily. Start taking on: January 23, 2022   atorvastatin 40 MG tablet Commonly known as: LIPITOR Take 1 tablet (40 mg total) by mouth daily. Start taking on: January 23, 2022   carvedilol 25 MG tablet Commonly known as: COREG Take 1 tablet (25 mg total) by mouth 2 (two) times daily with a meal. What changed:  medication strength how much to take   cetirizine 10 MG tablet Commonly known as: ZYRTEC Take 10 mg by mouth daily as needed for allergies.   cilostazol 100 MG tablet Commonly known as: PLETAL Take 1 tablet (100 mg total) by mouth 2 (two) times daily.   cyanocobalamin 100 MCG tablet Take 5 tablets (500 mcg total) by mouth daily. What changed: how much to take    diclofenac Sodium 1 % Gel Commonly known as: VOLTAREN Apply 4 g topically 4 (four) times daily as needed for pain.   feeding supplement Liqd Take 237 mLs by mouth 3 (three) times daily between meals.   folic acid 1 MG tablet Commonly known as: FOLVITE Take 1 tablet (1 mg total) by mouth daily.   ipratropium-albuterol 0.5-2.5 (3) MG/3ML Soln Commonly known as: DUONEB Take 3 mLs by nebulization every 6 (six) hours as needed.   levETIRAcetam 1000 MG tablet Commonly known as: KEPPRA Take 1 tablet (1,000 mg total) by mouth 2 (two) times daily. What changed: Another medication  with the same name was removed. Continue taking this medication, and follow the directions you see here.   magnesium oxide 400 (240 Mg) MG tablet Commonly known as: MAG-OX Take 0.5 tablets (200 mg total) by mouth 2 (two) times daily.   multivitamin with minerals Tabs tablet Take 1 tablet by mouth daily.   nicotine 21 mg/24hr patch Commonly known as: NICODERM CQ - dosed in mg/24 hours Place 1 patch (21 mg total) onto the skin daily.   QUEtiapine 25 MG tablet Commonly known as: SEROQUEL Take 1 tablet (25 mg total) by mouth at bedtime.   sildenafil 100 MG tablet Commonly known as: VIAGRA Take 100 mg by mouth daily as needed for erectile dysfunction.   thiamine 100 MG tablet Take 1 tablet (100 mg total) by mouth daily.               Discharge Care Instructions  (From admission, onward)           Start     Ordered   01/22/22 0000  Discharge wound care:       Comments: Apply Santyl to intergluteal cleft pressure injury in a nickel thick layer followed by a moistened saline gauze, dry gauze and sacral foam dressing placed upside down to prevent soiling. Apply twice daily.   01/22/22 1137            Follow-up Information     [facility]. Schedule an appointment as soon as possible for a visit in 1 week(s).   Specialty: Internal Medicine Contact information: [address] [place] [address] [phone]         Garvin Fila, MD. Schedule an appointment as soon as possible for a visit in 1 month(s).   Specialties: Neurology, Radiology Contact information: [address] [place] [address] [phone]                No Known Allergies   Other Procedures/Studies: CT ANGIO HEAD NECK W WO CM  Result Date: 01/18/2022 CLINICAL DATA:  Stroke, determine embolic source EXAM: CT ANGIOGRAPHY HEAD AND NECK TECHNIQUE: Multidetector CT imaging of the head and neck was performed using the standard protocol during bolus administration of intravenous contrast. Multiplanar CT image reconstructions and MIPs were obtained to evaluate the vascular anatomy. Carotid stenosis measurements (when applicable) are obtained utilizing NASCET criteria, using the distal internal carotid diameter as the denominator. RADIATION DOSE REDUCTION: This exam was performed according to the departmental dose-optimization program which includes automated exposure control, adjustment of the mA and/or kV according to patient size and/or use of iterative reconstruction technique. CONTRAST:  90mL OMNIPAQUE IOHEXOL 350 MG/ML SOLN COMPARISON:  Brain MRI from yesterday FINDINGS: CT HEAD FINDINGS Brain: Underestimated ischemia compared to prior brain MRI. No hemorrhage or visible progression. No hydrocephalus or collection. Chronic small vessel disease. Vascular: No hyperdense vessel or unexpected calcification. Skull: Normal. Negative for fracture or focal lesion. Sinuses: Imaged portions are clear. Orbits: Negative Review of the MIP images confirms the above findings CTA NECK FINDINGS Aortic arch: Atheromatous plaque. Four vessel branching with both vertebral arteries arising from the arch, the right vertebral artery with a retroesophageal course. Right carotid system: Calcified plaque at the posterior wall of the ICA bulb. No stenosis or ulceration. Left carotid system: Mild  atheromatous plaque on the proximal ICA without stenosis or ulceration. Vertebral arteries: No proximal subclavian stenosis. Both vertebral arteries arise from the arch. Motion artifact affects visualization of the proximal left vertebral artery. Calcified  plaque at the right vertebral origin without stenosis. Both cervical vertebral arteries are smoothly contoured in the neck. Skeleton: Bulky spondylosis. Other neck: No acute finding Upper chest: Right apical cavity, reference chest CT September 2022. Clustered nodularity at the left apex, inflammatory appearing and similar to prior. Review of the MIP images confirms the above findings CTA HEAD FINDINGS Anterior circulation: Atheromatous plaque on the carotid siphons with up to 50% narrowing at the right anterior genu. No branch occlusion or flow limiting stenosis, beading, or aneurysm. Posterior circulation: Codominant vertebral arteries. Proximal basilar fenestration. Vertebral and basilar arteries are smoothly contoured and diffusely patent. No PCA branch occlusion, beading, or aneurysm. Venous sinuses: Unremarkable Anatomic variants: As above Review of the MIP images confirms the above findings IMPRESSION: 1. No emergent finding. No flow limiting stenosis or embolic source to explain infarcts. 2. ~50% atheromatous narrowing at the right carotid siphon. Electronically Signed   By: Jorje Guild M.D.   On: 01/18/2022 11:18   DG Abd 1 View  Result Date: 01/17/2022 CLINICAL DATA:  MRI screening. EXAM: ABDOMEN - 1 VIEW COMPARISON:  01/08/2022 FINDINGS: Enteric tube has its metallic tip projecting in the distal stomach. Normal bowel gas pattern. Small densities noted in the right mid abdomen, suspected to be intrarenal stones. There are scattered aortic and pelvic atherosclerotic vascular calcifications. No metallic foreign body.  No findings to preclude MRI scanning. IMPRESSION: 1. No acute findings. 2. No metallic foreign body to preclude MRI scanning.  Electronically Signed   By: Lajean Manes M.D.   On: 01/17/2022 14:00   CT HEAD WO CONTRAST (5MM)  Result Date: 01/05/2022 CLINICAL DATA:  Seizure, new-onset, no history of trauma EXAM: CT HEAD WITHOUT CONTRAST TECHNIQUE: Contiguous axial images were obtained from the base of the skull through the vertex without intravenous contrast. RADIATION DOSE REDUCTION: This exam was performed according to the departmental dose-optimization program which includes automated exposure control, adjustment of the mA and/or kV according to patient size and/or use of iterative reconstruction technique. COMPARISON:  CT head 11/26/2021 BRAIN: BRAIN Patchy and confluent areas of decreased attenuation are noted throughout the deep and periventricular white matter of the cerebral hemispheres bilaterally, compatible with chronic microvascular ischemic disease. No evidence of large-territorial acute infarction. No parenchymal hemorrhage. No mass lesion. No extra-axial collection. No mass effect or midline shift. No hydrocephalus. Basilar cisterns are patent. Vascular: No hyperdense vessel. Skull: No acute fracture or focal lesion. Sinuses/Orbits: Paranasal sinuses and mastoid air cells are clear. The orbits are unremarkable. Other: None. IMPRESSION: No acute intracranial abnormality. Electronically Signed   By: Iven Finn M.D.   On: 01/05/2022 18:55   MR BRAIN WO CONTRAST  Result Date: 01/17/2022 CLINICAL DATA:  Acute stroke suspected.  Left-sided weakness. EXAM: MRI HEAD WITHOUT CONTRAST TECHNIQUE: Multiplanar, multiecho pulse sequences of the brain and surrounding structures were obtained without intravenous contrast. COMPARISON:  Head CT 01/05/2022 FINDINGS: Brain: Small acute infarct in the right cerebellum. Chronic lacune in the right centrum semiovale with central cystic component. Best seen on coronal diffusion acquisition there is a cluster of small weakly restricted areas in the right posterior cerebral white matter.  No hemorrhage, hydrocephalus, or masslike finding. Negative for collection. Remote perforator infarct at the right basal ganglia. Mild ischemic gliosis in the periventricular white matter. Cerebral volume loss. Vascular: Preserved flow voids Skull and upper cervical spine: Normal marrow signal. Sinuses/Orbits: Patchy opacification of paranasal sinuses, overall mild. IMPRESSION: 1. Small acute infarct in the right cerebellum. 2. Cluster of subacute  to chronic small infarcts in the right frontal parietal white matter. 3. Cerebral volume loss and chronic small vessel ischemia. Electronically Signed   By: Jorje Guild M.D.   On: 01/17/2022 12:17   DG CHEST PORT 1 VIEW  Result Date: 01/12/2022 CLINICAL DATA:  Encounter for fever EXAM: PORTABLE CHEST 1 VIEW COMPARISON:  Yesterday FINDINGS: Reticulonodular opacities bilaterally with volume loss and thick walled cavity at the right apex. The feeding tube at least reaches the stomach. Stable heart size and mediastinal contours. No acute airspace disease, pleural fluid, or air leak. IMPRESSION: Chronic lung disease and right apical cavity. No acute finding when compared to yesterday. Electronically Signed   By: Jorje Guild M.D.   On: 01/12/2022 06:27   DG CHEST PORT 1 VIEW  Result Date: 01/11/2022 CLINICAL DATA:  COVID-19, fever EXAM: PORTABLE CHEST 1 VIEW COMPARISON:  01/06/2022, 01/05/2022 FINDINGS: Feeding tube coursing below the diaphragm. Right upper lobe pleuroparenchymal disease with a 6.2 cm cavitary lesion. Numerous bilateral pulmonary nodules of varying sizes again noted. No pneumothorax. Small right pleural effusion. No left pleural effusion. Stable cardiomediastinal silhouette. No acute osseous abnormality. IMPRESSION: 1. Right upper lobe pleuroparenchymal disease with a 6.2 cm cavitary lesion. Numerous bilateral pulmonary nodules of varying sizes again noted. No significant interval change compared with 01/05/2022 Electronically Signed   By: Kathreen Devoid M.D.   On: 01/11/2022 10:39   DG Chest Port 1 View  Result Date: 01/06/2022 CLINICAL DATA:  Endotracheal tube placement. EXAM: PORTABLE CHEST 1 VIEW COMPARISON:  01/05/2022 FINDINGS: 0537 hours. Endotracheal tube tip is approximately 4.2 cm above the base of the carina. The NG tube passes into the stomach although the distal tip position is not included on the film. Pleuroparenchymal scarring in the right upper lung with apparent cavitary lesion in the apex is similar to prior. Volume loss in the right hemithorax is similar to prior. Fairly extensive architectural distortion and ill-defined nodularity in both lungs is similar to prior. No pleural effusion. Telemetry leads overlie the chest. IMPRESSION: Endotracheal tube tip is approximately 4.2 cm above the base of the carina. NG tube passes into the stomach although the distal tip position is not included on the film. Similar appearance of the volume loss right hemithorax with extensive pleuroparenchymal disease in the right apex and scattered ill-defined nodularity in both lungs. Electronically Signed   By: Misty Stanley M.D.   On: 01/06/2022 06:25   DG Chest Portable 1 View  Result Date: 01/05/2022 CLINICAL DATA:  Gastric tube position.  Seizure. EXAM: PORTABLE CHEST 1 VIEW COMPARISON:  11/26/2021 FINDINGS: Endotracheal tube in good position.  NG tube in the body the stomach Pleuroparenchymal scarring right lung apex with volume loss unchanged from the prior study. Mild scarring right lung base. Possible lung nodule left lung base. Prior CT 08/17/2021 revealed multiple bilateral pulmonary nodules. No pleural effusion. IMPRESSION: Endotracheal tube in good position.  NG tube in the body stomach Pleuroparenchymal scarring right lung apex. Multiple bilateral lung nodules as noted on prior studies. Electronically Signed   By: Franchot Gallo M.D.   On: 01/05/2022 18:20   DG Abd Portable 1V  Result Date: 01/08/2022 CLINICAL DATA:  NG tube placement  EXAM: PORTABLE ABDOMEN - 1 VIEW COMPARISON:  01/05/2022 FINDINGS: Nonweighted enteric feeding tube is positioned with tip and side port below the diaphragm, tip in the vicinity of the gastric antrum. IMPRESSION: Nonweighted enteric feeding tube is positioned with tip and side port below the diaphragm, tip in the  vicinity of the gastric antrum. Recommend advancement if post pyloric positioning is desired. Electronically Signed   By: Delanna Ahmadi M.D.   On: 01/08/2022 15:43   DG Abd Portable 1 View  Result Date: 01/05/2022 CLINICAL DATA:  Enteric tube position EXAM: PORTABLE ABDOMEN - 1 VIEW COMPARISON:  08/14/2021 FINDINGS: NG tube tip in the body the stomach. Normal bowel gas pattern in the upper abdomen. Lower abdomen pelvis not included. Patchy bilateral airspace disease. IMPRESSION: NG tip in the body the stomach.  Normal bowel gas pattern. Electronically Signed   By: Franchot Gallo M.D.   On: 01/05/2022 18:18   DG Swallowing Func-Speech Pathology  Result Date: 01/19/2022 Table formatting from the original result was not included. Objective Swallowing Evaluation: Type of Study: MBS-Modified Barium Swallow Study  Patient Details Name: Frederick [name] MRN: [id number] Date of Birth: 11/15/1961 Today's Date: 01/19/2022 Time: SLP Start Time (ACUTE ONLY): [id number] -SLP Stop Time (ACUTE ONLY): 1145 SLP Time Calculation (min) (ACUTE ONLY): 19 min Past Medical History: Past Medical History: Diagnosis Date  Arthritis   Asthma   Bilateral lower extremity edema 05/28/2021  propping feet up  Cough 05/28/2021  with clear sputum  Dyspnea   Dyspnea 05/28/2021  with exertion  EtOH dependence ([place])   drinks 2-3 of 40ounes a day  Fibula fracture   Dr Mardelle Matte 02-2011  Finger osteomyelitis, right ([place]) 05/26/2021  right index finger  Hernia   surgery 01-17-13  Hypertension   not taking bp meds since may 2022  Seizures ([place])   Streptococcal bacteremia 09/09/2021  Tuberculosis   couple of yrs ago took tx at [facility] per pt on 05-28-2021 Past Surgical History: Past Surgical History: Procedure Laterality Date  AMPUTATION Right 05/29/2021  Procedure: PARTIAL RIGHT 2ND FINGER AMPUTATION;  Surgeon: Mcarthur Rossetti, MD;  Location: [facility];  Service: Orthopedics;  Laterality: Right;  ANKLE SURGERY    INGUINAL HERNIA REPAIR Right 01/19/2013  Procedure: HERNIA REPAIR INGUINAL ADULT;  Surgeon: Gayland Curry, MD,FACS;  Location: [facility];  Service: General;  Laterality: Right;  TEE WITHOUT CARDIOVERSION N/A 08/14/2021  Procedure: TRANSESOPHAGEAL ECHOCARDIOGRAM (TEE);  Surgeon: Josue Hector, MD;  [facility] ENDOSCOPY;  Service: Cardiovascular;  Laterality: N/A;  TIBIA IM NAIL INSERTION Left 01/22/2013  Procedure: INTRAMEDULLARY (IM) NAIL TIBIAL;  Surgeon: Johnny Bridge, MD;  Location: [facility];  Service: Orthopedics;  Laterality: Left; HPI: Pt is a 61 year old male who [facility] ED 1/31 via for seizures, concern for alcohol withdrawal. On arrival to ED, patient had ongoing focal left-sided seizure activity. He was loaded with Keppra and intubated for airway protection. ETT 1/31-2/3. EEG 2/2: severe diffuse encephalopathy. No seizures or epileptiform discharges. Pt found to have COVID-19. 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 PMH: HTN, asthma, tuberculosis (s/p treatment at [facility]), GERD, EtOH abuse, R finger osteomyelitis with strep bacteremia. Last seen by SLP September, 2022 with recommendation for regular texture solids and thin liquids  Subjective: more participatory today  Recommendations for follow up therapy are one component of a multi-disciplinary discharge planning process, led by the attending physician.  Recommendations may be updated based on patient status, additional functional criteria and insurance authorization. Assessment / Plan /  Recommendation Clinical Impressions 01/19/2022 Clinical Impression Pt has mild oral and pharyngoesophageal dysphagia. His mastication takes some increased effort with regular  solids and he has reduced lingual propulsion posteriorly with purees/solids. Premature spillage is noted with thin liquids and lingual residue is observed after consecutive straw sips, spilling into the valleculae after the swallow. Pharyngeal he has minimally reduced laryngeal closure that when paired with premature spillage results in trace penetration before the swallow that clears spontaneously either as he finishes that swallow or completes a second reflexive one (PAS 2). Pt also has reduced UES opening, with suspected osteophytes felt to be contributing to mild amounts of backflow that re-enter the pharynx. Removal of cortrak may also help with bolus flow, but during this study, penetration occurred only once as thin liquid backflow spilled posteriorly over the arytenoids. This also cleared spontaneously though and there was no aspiration across the study. Barium tablet also did not appear to exit the esophagus (MD not present to confirm). Recommend starting with Dys 3 diet and thin liquids. SLP Visit Diagnosis Dysphagia, oropharyngeal phase (R13.12) Attention and concentration deficit following -- Frontal lobe and executive function deficit following -- Impact on safety and function Mild aspiration risk   Treatment Recommendations 01/19/2022 Treatment Recommendations Therapy as outlined in treatment plan below   Prognosis 01/19/2022 Prognosis for Safe Diet Advancement Good Barriers to Reach Goals Cognitive deficits Barriers/Prognosis Comment -- Diet Recommendations 01/19/2022 SLP Diet Recommendations Dysphagia 3 (Mech soft) solids;Thin liquid Liquid Administration via Cup;Straw Medication Administration Whole meds with liquid Compensations Slow rate;Small sips/bites;Minimize environmental distractions Postural Changes Seated upright at 90  degrees;Remain semi-upright after after feeds/meals (Comment)   Other Recommendations 01/19/2022 Recommended Consults -- Oral Care Recommendations Oral care BID Other Recommendations -- Follow Up Recommendations Skilled nursing-short term rehab (<3 hours/day) Assistance recommended at discharge Frequent or constant Supervision/Assistance Functional Status Assessment Patient has had a recent decline in their functional status and demonstrates the ability to make significant improvements in function in a reasonable and predictable amount of time. Frequency and Duration  01/19/2022 Speech Therapy Frequency (ACUTE ONLY) min 2x/week Treatment Duration 2 weeks   Oral Phase 01/19/2022 Oral Phase Impaired Oral - Pudding Teaspoon -- Oral - Pudding Cup -- Oral - Honey Teaspoon -- Oral - Honey Cup -- Oral - Nectar Teaspoon -- Oral - Nectar Cup -- Oral - Nectar Straw -- Oral - Thin Teaspoon -- Oral - Thin Cup Premature spillage Oral - Thin Straw Premature spillage;Lingual/palatal residue Oral - Puree Reduced posterior propulsion;Delayed oral transit Oral - Mech Soft -- Oral - Regular Impaired mastication Oral - Multi-Consistency -- Oral - Pill WFL Oral Phase - Comment --  Pharyngeal Phase 01/19/2022 Pharyngeal Phase Impaired Pharyngeal- Pudding Teaspoon -- Pharyngeal -- Pharyngeal- Pudding Cup -- Pharyngeal -- Pharyngeal- Honey Teaspoon -- Pharyngeal -- Pharyngeal- Honey Cup -- Pharyngeal -- Pharyngeal- Nectar Teaspoon -- Pharyngeal -- Pharyngeal- Nectar Cup -- Pharyngeal -- Pharyngeal- Nectar Straw -- Pharyngeal -- Pharyngeal- Thin Teaspoon -- Pharyngeal -- Pharyngeal- Thin Cup Penetration/Aspiration during swallow;Reduced airway/laryngeal closure Pharyngeal Material enters airway, remains ABOVE vocal cords then ejected out Pharyngeal- Thin Straw Reduced airway/laryngeal closure;Penetration/Aspiration during swallow;Penetration/Apiration after swallow Pharyngeal Material enters airway, remains ABOVE vocal cords then ejected out  Pharyngeal- Puree WFL Pharyngeal -- Pharyngeal- Mechanical Soft -- Pharyngeal -- Pharyngeal- Regular WFL Pharyngeal -- Pharyngeal- Multi-consistency -- Pharyngeal -- Pharyngeal- Pill WFL Pharyngeal -- Pharyngeal Comment --  Cervical Esophageal Phase  01/19/2022 Cervical Esophageal Phase Impaired Pudding Teaspoon -- Pudding Cup -- Honey Teaspoon -- Honey Cup -- Nectar Teaspoon -- Nectar Cup -- Nectar Straw -- Thin Teaspoon -- Thin Cup Reduced cricopharyngeal relaxation;Esophageal backflow into the pharynx Thin  Straw Reduced cricopharyngeal relaxation;Esophageal backflow into the pharynx Puree Reduced cricopharyngeal relaxation;Esophageal backflow into the pharynx Mechanical Soft -- Regular Reduced cricopharyngeal relaxation;Esophageal backflow into the pharynx Multi-consistency -- Pill Reduced cricopharyngeal relaxation Cervical Esophageal Comment -- Osie Bond., M.A. CCC-SLP Acute Rehabilitation Services Pager [phone] Office [phone] 01/19/2022, 1:35 PM                     EEG adult  Result Date: 01/06/2022 Lora Havens, MD     01/06/2022  8:46 AM Patient Name: ACHYUTH WILBOURNE MRN: [id number] Epilepsy Attending: Lora Havens Referring Physician/Provider: Lestine Mount, PA-C Date: 01/05/2022 Duration: 21.32 mins Patient history: 61 year old male brought in with convulsive status epilepticus.  EEG to evaluate for seizure. Level of alertness: comatose AEDs during EEG study: LEV, propofol, Versed Technical aspects: This EEG study was done with scalp electrodes positioned according to the 10-20 International system of electrode placement. Electrical activity was acquired at a sampling rate of 500Hz  and reviewed with a high frequency filter of 70Hz  and a low frequency filter of 1Hz . EEG data were recorded continuously and digitally stored. Description:  EEG showed continuous generalized 2 to 3 Hz delta slowing with overriding 15 to 18 Hz beta activity. Hyperventilation and photic stimulation were not  performed.    ABNORMALITY - Continuous slow, generalized  IMPRESSION: This study is suggestive of severe diffuse encephalopathy, nonspecific etiology but likely related to sedation. No seizures or epileptiform discharges were seen throughout the recording. Priyanka Barbra Sarks   Overnight EEG with video  Result Date: 01/06/2022 Lora Havens, MD     01/07/2022  8:54 AM Patient Name: OXLEY SARNI MRN: [id number] Epilepsy Attending: Lora Havens Referring Physician/Provider: Donnetta Simpers, MD Duration: 01/05/2022 2224 to 01/06/2022 2224  Patient history: 61 year old male brought in with convulsive status epilepticus.  EEG to evaluate for seizure.  Level of alertness: comatose  AEDs during EEG study: LEV, propofol, Versed  Technical aspects: This EEG study was done with scalp electrodes positioned according to the 10-20 International system of electrode placement. Electrical activity was acquired at a sampling rate of 500Hz  and reviewed with a high frequency filter of 70Hz  and a low frequency filter of 1Hz . EEG data were recorded continuously and digitally stored.  Description:  EEG showed burst suppression with bursts of 5 to 6 Hz theta slowing admixed with 15 to 18 Hz generalized beta activity lasting about 3 to 4 seconds alternating with generalized EEG suppression lasting 4 to 6 seconds.  Gradually as sedation was weaned, periods of suppression became shorter and EEG evolved into continuous generalized low amplitude mixed frequencies with predominantly 3 to 6 Hz theta-delta slowing.  Hyperventilation and photic stimulation were not performed.    ABNORMALITY -Burst suppression, generalized -Continuous slow, generalized IMPRESSION: This study was initially suggestive of profound diffuse encephalopathy, nonspecific etiology but likely related to sedation.  Gradually, as sedation was weaned EEG was suggestive severe diffuse encephalopathy.  No seizures or epileptiform discharges were seen throughout the  recording.  Lora Havens   ECHOCARDIOGRAM COMPLETE  Result Date: 01/18/2022    ECHOCARDIOGRAM REPORT   Patient Name:   JASER GUNSCH Date of Exam: 01/18/2022 Medical Rec #:  [id number]      Height:       73.0 in Accession #:    [id number]     Weight:       173.9 lb Date of Birth:  January 04, 1961      BSA:  2.028 m Patient Age:    57 years       BP:           119/65 mmHg Patient Gender: M              HR:           91 bpm. Exam Location:  Inpatient Procedure: 2D Echo, Cardiac Doppler and Color Doppler Indications:    Stroke  History:        Patient has prior history of Echocardiogram examinations. CHF;                 Risk Factors:Current Smoker. ETOH abuse. CKD.  Sonographer:    Clayton Lefort RDCS (AE) Referring Phys: [id number] [place]  1. Left ventricular ejection fraction, by estimation, is 55 to 60%. The left ventricle has normal function. The left ventricle has no regional wall motion abnormalities. There is moderate asymmetric left ventricular hypertrophy of the basal-septal segment. Left ventricular diastolic parameters were normal.  2. Right ventricular systolic function is normal. The right ventricular size is normal. There is normal pulmonary artery systolic pressure. The estimated right ventricular systolic pressure is [id number] mmHg.  3. The mitral valve is normal in structure. Trivial mitral valve regurgitation.  4. The aortic valve was not well visualized. Aortic valve regurgitation is not visualized. No aortic stenosis is present.  5. The inferior vena cava is normal in size with greater than 50% respiratory variability, suggesting right atrial pressure of 3 mmHg. FINDINGS  Left Ventricle: Left ventricular ejection fraction, by estimation, is 55 to 60%. The left ventricle has normal function. The left ventricle has no regional wall motion abnormalities. The left ventricular internal cavity size was normal in size. There is  moderate asymmetric left ventricular hypertrophy of the  basal-septal segment. Left ventricular diastolic parameters were normal. Right Ventricle: The right ventricular size is normal. Right vetricular wall thickness was not well visualized. Right ventricular systolic function is normal. There is normal pulmonary artery systolic pressure. The tricuspid regurgitant velocity is 2.28 m/s, and with an assumed right atrial pressure of 3 mmHg, the estimated right ventricular systolic pressure is [id number] mmHg. Left Atrium: Left atrial size was normal in size. Right Atrium: Right atrial size was normal in size. Pericardium: There is no evidence of pericardial effusion. Mitral Valve: The mitral valve is normal in structure. Trivial mitral valve regurgitation. Tricuspid Valve: The tricuspid valve is normal in structure. Tricuspid valve regurgitation is trivial. Aortic Valve: The aortic valve was not well visualized. Aortic valve regurgitation is not visualized. No aortic stenosis is present. Aortic valve mean gradient measures 3.0 mmHg. Aortic valve peak gradient measures 5.6 mmHg. Aortic valve area, by VTI measures 3.77 cm. Pulmonic Valve: The pulmonic valve was not well visualized. Pulmonic valve regurgitation is not visualized. Aorta: The aortic root is normal in size and structure. Venous: The inferior vena cava is normal in size with greater than 50% respiratory variability, suggesting right atrial pressure of 3 mmHg. IAS/Shunts: The interatrial septum was not well visualized.  LEFT VENTRICLE PLAX 2D LVIDd:         4.20 cm      Diastology LVIDs:         3.30 cm      LV e' medial:    9.15 cm/s LV PW:         2.20 cm      LV E/e' medial:  5.2 LV IVS:        2.30  cm      LV e' lateral:   13.40 cm/s LVOT diam:     2.40 cm      LV E/e' lateral: 3.5 LV SV:         72 LV SV Index:   36 LVOT Area:     4.52 cm  LV Volumes (MOD) LV vol d, MOD A2C: 82.0 ml LV vol d, MOD A4C: 103.0 ml LV vol s, MOD A2C: 37.3 ml LV vol s, MOD A4C: 41.4 ml LV SV MOD A2C:     44.7 ml LV SV MOD A4C:     103.0  ml LV SV MOD BP:      54.3 ml RIGHT VENTRICLE             IVC RV Basal diam:  2.70 cm     IVC diam: 1.20 cm RV S prime:     14.30 cm/s TAPSE (M-mode): 1.3 cm LEFT ATRIUM             Index        RIGHT ATRIUM           Index LA diam:        3.30 cm 1.63 cm/m   RA Area:     13.30 cm LA Vol (A2C):   38.6 ml 19.04 ml/m  RA Volume:   28.70 ml  14.15 ml/m LA Vol (A4C):   35.2 ml 17.36 ml/m LA Biplane Vol: 40.6 ml 20.02 ml/m  AORTIC VALVE AV Area (Vmax):    3.26 cm AV Area (Vmean):   3.28 cm AV Area (VTI):     3.77 cm AV Vmax:           118.00 cm/s AV Vmean:          79.300 cm/s AV VTI:            0.192 m AV Peak Grad:      5.6 mmHg AV Mean Grad:      3.0 mmHg LVOT Vmax:         85.10 cm/s LVOT Vmean:        57.500 cm/s LVOT VTI:          0.160 m LVOT/AV VTI ratio: 0.83  AORTA Ao Root diam: 3.80 cm MITRAL VALVE               TRICUSPID VALVE MV Area (PHT): 3.17 cm    TR Peak grad:   20.8 mmHg MV Decel Time: 239 msec    TR Vmax:        228.00 cm/s MV E velocity: 47.30 cm/s MV A velocity: 51.90 cm/s  SHUNTS MV E/A ratio:  0.91        Systemic VTI:  0.16 m                            Systemic Diam: 2.40 cm Epifanio Lesches MD Electronically signed by Epifanio Lesches MD Signature Date/Time: 01/18/2022/7:55:25 PM    Final      TODAY-DAY OF DISCHARGE:  Subjective:   Gaynelle Cage today has no headache,no chest abdominal pain,no new weakness tingling or numbness, feels much better wants to go home today.   Objective:   Blood pressure 121/71, pulse 91, temperature 97.8 F (36.6 C), temperature source Oral, resp. rate 19, height 6\' 1"  (1.854 m), weight 78.9 kg, SpO2 98 %.  Intake/Output Summary (Last 24 hours) at 01/22/2022 1141 Last data filed at 01/22/2022 [phone]  Gross per 24 hour  Intake 5610 ml  Output 250 ml  Net 5360 ml   Filed Weights   01/14/22 0500 01/17/22 0500 01/18/22 0500  Weight: 79.1 kg 79 kg 78.9 kg    Exam: Awake Alert, Oriented *3, No new F.N deficits, Normal  affect [place].AT,PERRAL Supple Neck,No JVD, No cervical lymphadenopathy appriciated.  Symmetrical Chest wall movement, Good air movement bilaterally, CTAB RRR,No Gallops,Rubs or new Murmurs, No Parasternal Heave +ve B.Sounds, Abd Soft, Non tender, No organomegaly appriciated, No rebound -guarding or rigidity. No Cyanosis, Clubbing or edema, No new Rash or bruise   PERTINENT RADIOLOGIC STUDIES: No results found.   PERTINENT LAB RESULTS: CBC: No results for input(s): WBC, HGB, HCT, PLT in the last 72 hours. CMET CMP     Component Value Date/Time   NA 135 01/22/2022 1010   K 4.6 01/22/2022 1010   CL 99 01/22/2022 1010   CO2 24 01/22/2022 1010   GLUCOSE 130 (H) 01/22/2022 1010   BUN 26 (H) 01/22/2022 1010   CREATININE 0.94 01/22/2022 1010   CREATININE 0.74 10/27/2021 0000   CALCIUM 9.7 01/22/2022 1010   PROT 7.5 01/12/2022 0513   ALBUMIN 2.8 (L) 01/12/2022 0513   AST 32 01/12/2022 0513   ALT 14 01/12/2022 0513   ALKPHOS 74 01/12/2022 0513   BILITOT 0.4 01/12/2022 0513   GFRNONAA >60 01/22/2022 1010   GFRAA >60 04/27/2020 0009    GFR Estimated Creatinine Clearance: 92.1 mL/min (by C-G formula based on SCr of 0.94 mg/dL). No results for input(s): LIPASE, AMYLASE in the last 72 hours. No results for input(s): CKTOTAL, CKMB, CKMBINDEX, TROPONINI in the last 72 hours. Invalid input(s): POCBNP No results for input(s): DDIMER in the last 72 hours. No results for input(s): HGBA1C in the last 72 hours. No results for input(s): CHOL, HDL, LDLCALC, TRIG, CHOLHDL, LDLDIRECT in the last 72 hours. No results for input(s): TSH, T4TOTAL, T3FREE, THYROIDAB in the last 72 hours.  Invalid input(s): FREET3 No results for input(s): VITAMINB12, FOLATE, FERRITIN, TIBC, IRON, RETICCTPCT in the last 72 hours. Coags: No results for input(s): INR in the last 72 hours.  Invalid input(s): PT Microbiology: No results found for this or any previous visit (from the past 240 hour(s)).  FURTHER  DISCHARGE INSTRUCTIONS:  Get Medicines reviewed and adjusted: Please take all your medications with you for your next visit with your Primary MD  Laboratory/radiological data: Please request your Primary MD to go over all hospital tests and procedure/radiological results at the follow up, please ask your Primary MD to get all Hospital records sent to his/her office.  In some cases, they will be blood work, cultures and biopsy results pending at the time of your discharge. Please request that your primary care M.D. goes through all the records of your hospital data and follows up on these results.  Also Note the following: If you experience worsening of your admission symptoms, develop shortness of breath, life threatening emergency, suicidal or homicidal thoughts you must seek medical attention immediately by calling 911 or calling your MD immediately  if symptoms less severe.  You must read complete instructions/literature along with all the possible adverse reactions/side effects for all the Medicines you take and that have been prescribed to you. Take any new Medicines after you have completely understood and accpet all the possible adverse reactions/side effects.   Do not drive when taking Pain medications or sleeping medications (Benzodaizepines)  Do not take more than prescribed Pain, Sleep and Anxiety Medications. It  is not advisable to combine anxiety,sleep and pain medications without talking with your primary care practitioner  Special Instructions: If you have smoked or chewed Tobacco  in the last 2 yrs please stop smoking, stop any regular Alcohol  and or any Recreational drug use.  Wear Seat belts while driving.  Please note: You were cared for by a hospitalist during your hospital stay. Once you are discharged, your primary care physician will handle any further medical issues. Please note that NO REFILLS for any discharge medications will be authorized once you are discharged,  as it is imperative that you return to your primary care physician (or establish a relationship with a primary care physician if you do not have one) for your post hospital discharge needs so that they can reassess your need for medications and monitor your lab values.  Total Time spent coordinating discharge including counseling, education and face to face time equals greater than 30 minutes.  SignedOren Binet 01/22/2022 11:41 AM

## 2022-01-22 NOTE — TOC Transition Note (Signed)
Transition of Care [facility]) - CM/SW Discharge Note   Patient Details  Name: KAMARIUS BUCKBEE MRN: [id number] Date of Birth: 1960-12-23  Transition of Care [facility]) CM/SW Contact:  Mearl Latin, LCSW Phone Number: 01/22/2022, 1:14 PM   Clinical Narrative:    Patient will DC to: [facility] Anticipated DC date: 01/22/22 Family notified: Significant other, Dois Davenport Transport by: Sharin Mons   Per MD patient ready for DC to [facility]. RN to call report prior to discharge [phone] room 122B). RN, patient, patient's family, and facility notified of DC. Discharge Summary and FL2 sent to facility. DC packet on chart. Ambulance transport requested for patient.   CSW will sign off for now as social work intervention is no longer needed. Please consult [place] again if new needs arise.     Final next level of care: Skilled Nursing Facility Barriers to Discharge: Barriers Resolved   Patient Goals and CMS Choice Patient states their goals for this hospitalization and ongoing recovery are:: Rehab CMS Medicare.gov Compare Post Acute Care list provided to:: Patient Represenative (must comment) Choice offered to / list presented to :  (significant other)  Discharge Placement   Existing PASRR number confirmed : 01/22/22          Patient chooses bed at: [facility] Patient to be transferred to facility by: [facility] Name of family member notified: Significant other, Dois Davenport Patient and family notified of of transfer: 01/22/22  Discharge Plan and Services                                     Social Determinants of Health (SDOH) Interventions     Readmission Risk Interventions Readmission Risk Prevention Plan 01/12/2022  Transportation Screening Complete  Medication Review [facility]) Referral to Pharmacy  PCP or Specialist appointment within 3-5 days of discharge Not Complete  PCP/Specialist Appt Not Complete comments Patient not ready for d/c  HRI or Home Care  Consult Not Complete  SW Recovery Care/Counseling Consult Complete  Palliative Care Screening Not Applicable  Skilled Nursing Facility Complete  Some recent data might be hidden

## 2022-01-25 DIAGNOSIS — L89150 Pressure ulcer of sacral region, unstageable: Secondary | ICD-10-CM | POA: Diagnosis not present

## 2022-02-12 DIAGNOSIS — I509 Heart failure, unspecified: Secondary | ICD-10-CM | POA: Diagnosis not present

## 2022-02-12 DIAGNOSIS — R262 Difficulty in walking, not elsewhere classified: Secondary | ICD-10-CM | POA: Diagnosis not present

## 2022-02-17 DIAGNOSIS — R531 Weakness: Secondary | ICD-10-CM | POA: Diagnosis not present

## 2022-03-19 ENCOUNTER — Emergency Department (HOSPITAL_COMMUNITY): Payer: Medicare Other

## 2022-03-19 ENCOUNTER — Inpatient Hospital Stay (HOSPITAL_COMMUNITY)
Admission: EM | Admit: 2022-03-19 | Discharge: 2022-04-12 | DRG: 100 | Disposition: A | Discharge status: Hospice | Payer: Medicare Other | Attending: Internal Medicine | Admitting: Internal Medicine

## 2022-03-19 ENCOUNTER — Other Ambulatory Visit: Payer: Self-pay

## 2022-03-19 ENCOUNTER — Encounter (HOSPITAL_COMMUNITY): Payer: Self-pay | Admitting: Emergency Medicine

## 2022-03-19 DIAGNOSIS — Z66 Do not resuscitate: Secondary | ICD-10-CM | POA: Diagnosis not present

## 2022-03-19 DIAGNOSIS — D509 Iron deficiency anemia, unspecified: Secondary | ICD-10-CM | POA: Diagnosis present

## 2022-03-19 DIAGNOSIS — E1165 Type 2 diabetes mellitus with hyperglycemia: Secondary | ICD-10-CM | POA: Diagnosis not present

## 2022-03-19 DIAGNOSIS — E873 Alkalosis: Secondary | ICD-10-CM | POA: Diagnosis not present

## 2022-03-19 DIAGNOSIS — E871 Hypo-osmolality and hyponatremia: Secondary | ICD-10-CM | POA: Diagnosis not present

## 2022-03-19 DIAGNOSIS — R680 Hypothermia, not associated with low environmental temperature: Secondary | ICD-10-CM | POA: Diagnosis not present

## 2022-03-19 DIAGNOSIS — E11649 Type 2 diabetes mellitus with hypoglycemia without coma: Secondary | ICD-10-CM | POA: Diagnosis present

## 2022-03-19 DIAGNOSIS — G40901 Epilepsy, unspecified, not intractable, with status epilepticus: Secondary | ICD-10-CM | POA: Diagnosis not present

## 2022-03-19 DIAGNOSIS — T886XXA Anaphylactic reaction due to adverse effect of correct drug or medicament properly administered, initial encounter: Secondary | ICD-10-CM | POA: Diagnosis not present

## 2022-03-19 DIAGNOSIS — G9341 Metabolic encephalopathy: Secondary | ICD-10-CM | POA: Diagnosis present

## 2022-03-19 DIAGNOSIS — Z91199 Patient's noncompliance with other medical treatment and regimen due to unspecified reason: Secondary | ICD-10-CM

## 2022-03-19 DIAGNOSIS — Z781 Physical restraint status: Secondary | ICD-10-CM

## 2022-03-19 DIAGNOSIS — R414 Neurologic neglect syndrome: Secondary | ICD-10-CM | POA: Diagnosis present

## 2022-03-19 DIAGNOSIS — Z8611 Personal history of tuberculosis: Secondary | ICD-10-CM

## 2022-03-19 DIAGNOSIS — G928 Other toxic encephalopathy: Secondary | ICD-10-CM | POA: Diagnosis present

## 2022-03-19 DIAGNOSIS — F17210 Nicotine dependence, cigarettes, uncomplicated: Secondary | ICD-10-CM | POA: Diagnosis present

## 2022-03-19 DIAGNOSIS — E785 Hyperlipidemia, unspecified: Secondary | ICD-10-CM | POA: Diagnosis present

## 2022-03-19 DIAGNOSIS — D638 Anemia in other chronic diseases classified elsewhere: Secondary | ICD-10-CM | POA: Diagnosis present

## 2022-03-19 DIAGNOSIS — J45909 Unspecified asthma, uncomplicated: Secondary | ICD-10-CM | POA: Diagnosis present

## 2022-03-19 DIAGNOSIS — Z89021 Acquired absence of right finger(s): Secondary | ICD-10-CM

## 2022-03-19 DIAGNOSIS — F1020 Alcohol dependence, uncomplicated: Secondary | ICD-10-CM | POA: Diagnosis present

## 2022-03-19 DIAGNOSIS — J690 Pneumonitis due to inhalation of food and vomit: Secondary | ICD-10-CM | POA: Diagnosis not present

## 2022-03-19 DIAGNOSIS — Z833 Family history of diabetes mellitus: Secondary | ICD-10-CM

## 2022-03-19 DIAGNOSIS — R569 Unspecified convulsions: Secondary | ICD-10-CM | POA: Diagnosis not present

## 2022-03-19 DIAGNOSIS — T4275XA Adverse effect of unspecified antiepileptic and sedative-hypnotic drugs, initial encounter: Secondary | ICD-10-CM | POA: Diagnosis not present

## 2022-03-19 DIAGNOSIS — Z20822 Contact with and (suspected) exposure to covid-19: Secondary | ICD-10-CM | POA: Diagnosis present

## 2022-03-19 DIAGNOSIS — B952 Enterococcus as the cause of diseases classified elsewhere: Secondary | ICD-10-CM | POA: Diagnosis present

## 2022-03-19 DIAGNOSIS — I6330 Cerebral infarction due to thrombosis of unspecified cerebral artery: Secondary | ICD-10-CM | POA: Diagnosis present

## 2022-03-19 DIAGNOSIS — Z86711 Personal history of pulmonary embolism: Secondary | ICD-10-CM

## 2022-03-19 DIAGNOSIS — Z515 Encounter for palliative care: Secondary | ICD-10-CM

## 2022-03-19 DIAGNOSIS — Z86718 Personal history of other venous thrombosis and embolism: Secondary | ICD-10-CM

## 2022-03-19 DIAGNOSIS — E440 Moderate protein-calorie malnutrition: Secondary | ICD-10-CM | POA: Diagnosis present

## 2022-03-19 DIAGNOSIS — R197 Diarrhea, unspecified: Secondary | ICD-10-CM | POA: Diagnosis not present

## 2022-03-19 DIAGNOSIS — Z7982 Long term (current) use of aspirin: Secondary | ICD-10-CM

## 2022-03-19 DIAGNOSIS — Z79899 Other long term (current) drug therapy: Secondary | ICD-10-CM

## 2022-03-19 DIAGNOSIS — N390 Urinary tract infection, site not specified: Secondary | ICD-10-CM | POA: Diagnosis present

## 2022-03-19 DIAGNOSIS — E876 Hypokalemia: Secondary | ICD-10-CM | POA: Diagnosis present

## 2022-03-19 DIAGNOSIS — Z8673 Personal history of transient ischemic attack (TIA), and cerebral infarction without residual deficits: Secondary | ICD-10-CM

## 2022-03-19 DIAGNOSIS — R471 Dysarthria and anarthria: Secondary | ICD-10-CM | POA: Diagnosis not present

## 2022-03-19 DIAGNOSIS — Z8249 Family history of ischemic heart disease and other diseases of the circulatory system: Secondary | ICD-10-CM

## 2022-03-19 DIAGNOSIS — E162 Hypoglycemia, unspecified: Secondary | ICD-10-CM | POA: Diagnosis present

## 2022-03-19 DIAGNOSIS — Y92239 Unspecified place in hospital as the place of occurrence of the external cause: Secondary | ICD-10-CM | POA: Diagnosis not present

## 2022-03-19 DIAGNOSIS — I10 Essential (primary) hypertension: Secondary | ICD-10-CM | POA: Diagnosis present

## 2022-03-19 DIAGNOSIS — J9601 Acute respiratory failure with hypoxia: Secondary | ICD-10-CM | POA: Diagnosis not present

## 2022-03-19 DIAGNOSIS — R1312 Dysphagia, oropharyngeal phase: Secondary | ICD-10-CM | POA: Diagnosis not present

## 2022-03-19 HISTORY — DX: Other pulmonary embolism without acute cor pulmonale: I26.99

## 2022-03-19 LAB — CBC WITH DIFFERENTIAL/PLATELET
Abs Immature Granulocytes: 0.03 10*3/uL (ref 0.00–0.07)
Basophils Absolute: 0 10*3/uL (ref 0.0–0.1)
Basophils Relative: 0 %
Eosinophils Absolute: 0.2 10*3/uL (ref 0.0–0.5)
Eosinophils Relative: 3 %
HCT: 40.6 % (ref 39.0–52.0)
Hemoglobin: 12.1 g/dL — ABNORMAL LOW (ref 13.0–17.0)
Immature Granulocytes: 0 %
Lymphocytes Relative: 20 %
Lymphs Abs: 1.7 10*3/uL (ref 0.7–4.0)
MCH: 26.5 pg (ref 26.0–34.0)
MCHC: 29.8 g/dL — ABNORMAL LOW (ref 30.0–36.0)
MCV: 88.8 fL (ref 80.0–100.0)
Monocytes Absolute: 1 10*3/uL (ref 0.1–1.0)
Monocytes Relative: 11 %
Neutro Abs: 5.7 10*3/uL (ref 1.7–7.7)
Neutrophils Relative %: 66 %
Platelets: 295 10*3/uL (ref 150–400)
RBC: 4.57 MIL/uL (ref 4.22–5.81)
RDW: 16.6 % — ABNORMAL HIGH (ref 11.5–15.5)
WBC: 8.6 10*3/uL (ref 4.0–10.5)
nRBC: 0 % (ref 0.0–0.2)

## 2022-03-19 LAB — I-STAT CHEM 8, ED
BUN: 10 mg/dL (ref 8–23)
Calcium, Ion: 1.08 mmol/L — ABNORMAL LOW (ref 1.15–1.40)
Chloride: 101 mmol/L (ref 98–111)
Creatinine, Ser: 0.8 mg/dL (ref 0.61–1.24)
Glucose, Bld: 176 mg/dL — ABNORMAL HIGH (ref 70–99)
HCT: 42 % (ref 39.0–52.0)
Hemoglobin: 14.3 g/dL (ref 13.0–17.0)
Potassium: 4.3 mmol/L (ref 3.5–5.1)
Sodium: 134 mmol/L — ABNORMAL LOW (ref 135–145)
TCO2: 22 mmol/L (ref 22–32)

## 2022-03-19 LAB — CBG MONITORING, ED
Glucose-Capillary: 187 mg/dL — ABNORMAL HIGH (ref 70–99)
Glucose-Capillary: 118 mg/dL — ABNORMAL HIGH (ref 70–99)
Glucose-Capillary: 36 mg/dL — CL (ref 70–99)

## 2022-03-19 LAB — COMPREHENSIVE METABOLIC PANEL
ALT: 8 U/L (ref 0–44)
AST: 18 U/L (ref 15–41)
Albumin: 3 g/dL — ABNORMAL LOW (ref 3.5–5.0)
Alkaline Phosphatase: 133 U/L — ABNORMAL HIGH (ref 38–126)
Anion gap: 13 (ref 5–15)
BUN: 9 mg/dL (ref 8–23)
CO2: 20 mmol/L — ABNORMAL LOW (ref 22–32)
Calcium: 9.2 mg/dL (ref 8.9–10.3)
Chloride: 102 mmol/L (ref 98–111)
Creatinine, Ser: 0.97 mg/dL (ref 0.61–1.24)
GFR, Estimated: >60 mL/min (ref >60)
Glucose, Bld: 185 mg/dL — ABNORMAL HIGH (ref 70–99)
Potassium: 4.3 mmol/L (ref 3.5–5.1)
Sodium: 135 mmol/L (ref 135–145)
Total Bilirubin: 0.3 mg/dL (ref 0.3–1.2)
Total Protein: 7.9 g/dL (ref 6.5–8.1)

## 2022-03-19 MED ORDER — DEXTROSE 50 % IV SOLN
INTRAVENOUS | Status: AC
  Filled 2022-03-19: qty 50

## 2022-03-19 MED ORDER — LORAZEPAM 2 MG/ML IJ SOLN
1.0000 mg | Freq: Once | INTRAMUSCULAR | Status: AC
  Administered 2022-03-19: 1 mg via INTRAVENOUS
  Filled 2022-03-19: qty 1

## 2022-03-19 MED ORDER — DEXTROSE 50 % IV SOLN
1.0000 | Freq: Once | INTRAVENOUS | Status: AC
  Administered 2022-03-19: 50 mL via INTRAVENOUS

## 2022-03-19 MED ORDER — LEVETIRACETAM IN NACL 1000 MG/100ML IV SOLN
1000.0000 mg | INTRAVENOUS | Status: AC
  Administered 2022-03-19 (×2): 1000 mg via INTRAVENOUS
  Filled 2022-03-19 (×2): qty 100

## 2022-03-19 NOTE — ED Provider Notes (Signed)
?[facility] EMERGENCY DEPARTMENT ?Provider Note ? ? ?CSN: [id number] ?Arrival date & time: 03/19/22  1956 ? ?  ? ?History ? ?Chief Complaint  ?Patient presents with  ? Seizures  ? ? ?Frederick [name] is a 61 y.o. male.  Presents to ER for seizure. ? ?Patient very poor historian, history is limited due to altered mental status.  EMS was contacted for seizure activity.  Found to have tremors in all extremities, reportedly has baseline left-sided weakness and slurred speech.  Speech worse today. ? ?Additional history was obtained from patient's significant other, states that he normally can use his left side. ? ?Additional history from review of chart, discharge summary on 01/22/2022, admitted for status epilepticus.  Required intubation, ICU admission.  Small right CVA on MRI from 01/17/2022. ? ?HPI ? ?  ? ?Home Medications ?Prior to Admission medications   ?Medication Sig Start Date End Date Taking? Authorizing Provider  ?albuterol (VENTOLIN HFA) 108 (90 Base) MCG/ACT inhaler Inhale 2 puffs into the lungs every 4 (four) hours as needed for shortness of breath or wheezing. 12/06/19   [provider]  ?aspirin 81 MG chewable tablet Place 1 tablet (81 mg total) into feeding tube daily. 01/23/22   Ghimire, Werner Lean, MD  ?atorvastatin (LIPITOR) 40 MG tablet Take 1 tablet (40 mg total) by mouth daily. 01/23/22   Ghimire, Werner Lean, MD  ?carvedilol (COREG) 25 MG tablet Take 1 tablet (25 mg total) by mouth 2 (two) times daily with a meal. 01/22/22   Ghimire, Werner Lean, MD  ?cetirizine (ZYRTEC) 10 MG tablet Take 10 mg by mouth daily as needed for allergies. 12/06/21   [provider]  ?cilostazol (PLETAL) 100 MG tablet Take 1 tablet (100 mg total) by mouth 2 (two) times daily. 01/22/22   Ghimire, Werner Lean, MD  ?cyanocobalamin 100 MCG tablet Take 5 tablets (500 mcg total) by mouth daily. 01/22/22   Ghimire, Werner Lean, MD  ?diclofenac Sodium (VOLTAREN) 1 % GEL Apply 4 g topically 4 (four) times daily  as needed for pain. 10/26/21   [provider]  ?feeding supplement (ENSURE ENLIVE / ENSURE PLUS) LIQD Take 237 mLs by mouth 3 (three) times daily between meals. 08/28/21   Regalado, Belkys A, MD  ?folic acid (FOLVITE) 1 MG tablet Take 1 tablet (1 mg total) by mouth daily. 08/29/21   Regalado, Belkys A, MD  ?ipratropium-albuterol (DUONEB) 0.5-2.5 (3) MG/3ML SOLN Take 3 mLs by nebulization every 6 (six) hours as needed. 08/28/21   Regalado, Belkys A, MD  ?levETIRAcetam (KEPPRA) 1000 MG tablet Take 1 tablet (1,000 mg total) by mouth 2 (two) times daily. 08/28/21   Regalado, Belkys A, MD  ?magnesium oxide (MAG-OX) 400 (240 Mg) MG tablet Take 0.5 tablets (200 mg total) by mouth 2 (two) times daily. 08/28/21   Regalado, Jon Billings A, MD  ?Multiple Vitamin (MULTIVITAMIN WITH MINERALS) TABS tablet Take 1 tablet by mouth daily. 08/28/21   Regalado, Belkys A, MD  ?nicotine (NICODERM CQ - DOSED IN MG/24 HOURS) 21 mg/24hr patch Place 1 patch (21 mg total) onto the skin daily. ?Patient not taking: Reported on 01/06/2022 08/29/21   Hartley Barefoot A, MD  ?QUEtiapine (SEROQUEL) 25 MG tablet Take 1 tablet (25 mg total) by mouth at bedtime. 01/22/22   Ghimire, Werner Lean, MD  ?sildenafil (VIAGRA) 100 MG tablet Take 100 mg by mouth daily as needed for erectile dysfunction. 10/31/21   [provider]  ?thiamine 100 MG tablet Take 1 tablet (100 mg  total) by mouth daily. ?Patient not taking: Reported on 01/06/2022 08/29/21   Hartley Barefootegalado, Belkys A, MD  ?ferrous sulfate 325 (65 FE) MG tablet Take 1 tablet (325 mg total) by mouth 2 (two) times daily with a meal. ?Patient not taking: Reported on 02/12/2020 12/03/15 04/17/20  Renae FickleShort, Mackenzie, MD  ?zolpidem (AMBIEN) 5 MG tablet Take 1 tablet (5 mg total) by mouth at bedtime as needed for sleep. ?Patient not taking: Reported on 02/12/2020 12/02/15 04/17/20  Kathlen ModyAkula, Vijaya, MD  ?   ? ?Allergies    ?Patient has no known allergies.   ? ?Review of Systems   ?Review of Systems  ?Unable to perform ROS:  Mental status change  ? ?Physical Exam ?Updated Vital Signs ?BP (!) 156/97   Pulse 83   Temp 98 ?F (36.7 ?C) (Temporal)   Resp (!) 21   Ht 6\' 1"  (1.854 m)   Wt 78.9 kg   SpO2 (!) 87%   BMI 22.95 kg/m?  ?Physical Exam ?Vitals and nursing note reviewed.  ?Constitutional:   ?   General: He is not in acute distress. ?   Appearance: He is well-developed.  ?HENT:  ?   Head: Normocephalic and atraumatic.  ?Eyes:  ?   Conjunctiva/sclera: Conjunctivae normal.  ?Cardiovascular:  ?   Rate and Rhythm: Normal rate and regular rhythm.  ?   Heart sounds: No murmur heard. ?Pulmonary:  ?   Effort: Pulmonary effort is normal. No respiratory distress.  ?   Breath sounds: Normal breath sounds.  ?Abdominal:  ?   Palpations: Abdomen is soft.  ?   Tenderness: There is no abdominal tenderness.  ?Musculoskeletal:     ?   General: No swelling.  ?   Cervical back: Neck supple.  ?Skin: ?   General: Skin is warm and dry.  ?   Capillary Refill: Capillary refill takes less than 2 seconds.  ?Neurological:  ?   Comments: Somewhat drowsy, arouses readily to sternal rub, he moves his right arm and leg spontaneously, left arm and leg have 0-1/5 strength  ?Psychiatric:     ?   Mood and Affect: Mood normal.  ? ? ?ED Results / Procedures / Treatments   ?Labs ?(all labs ordered are listed, but only abnormal results are displayed) ?Labs Reviewed  ?CBC WITH DIFFERENTIAL/PLATELET - Abnormal; Notable for the following components:  ?    Result Value  ? Hemoglobin 12.1 (*)   ? MCHC 29.8 (*)   ? RDW 16.6 (*)   ? All other components within normal limits  ?COMPREHENSIVE METABOLIC PANEL - Abnormal; Notable for the following components:  ? CO2 20 (*)   ? Glucose, Bld 185 (*)   ? Albumin 3.0 (*)   ? Alkaline Phosphatase 133 (*)   ? All other components within normal limits  ?CBG MONITORING, ED - Abnormal; Notable for the following components:  ? Glucose-Capillary 187 (*)   ? All other components within normal limits  ?CBG MONITORING, ED - Abnormal; Notable  for the following components:  ? Glucose-Capillary 36 (*)   ? All other components within normal limits  ?I-STAT CHEM 8, ED - Abnormal; Notable for the following components:  ? Sodium 134 (*)   ? Glucose, Bld 176 (*)   ? Calcium, Ion 1.08 (*)   ? All other components within normal limits  ?CBG MONITORING, ED - Abnormal; Notable for the following components:  ? Glucose-Capillary 118 (*)   ? All other components within normal limits  ?RESP PANEL  BY RT-PCR (FLU A&B, COVID) [id number]  ?RAPID URINE DRUG SCREEN, HOSP PERFORMED  ?URINALYSIS, ROUTINE W REFLEX MICROSCOPIC  ? ? ?EKG ?EKG Interpretation ? ?Date/Time:  Friday March 19 2022 20:00:43 EDT ?Ventricular Rate:  105 ?PR Interval:  163 ?QRS Duration: 87 ?QT Interval:  360 ?QTC Calculation: 476 ?R Axis:   66 ?Text Interpretation: Sinus tachycardia RAE, consider biatrial enlargement Borderline prolonged QT interval Confirmed by Marianna Fuss ([id number]) on 03/19/2022 9:58:11 PM ? ?Radiology ?CT HEAD CODE STROKE WO CONTRAST ? ?Result Date: 03/19/2022 ?CLINICAL DATA:  Code stroke. Initial evaluation for neuro deficit, stroke suspected. EXAM: CT HEAD WITHOUT CONTRAST TECHNIQUE: Contiguous axial images were obtained from the base of the skull through the vertex without intravenous contrast. RADIATION DOSE REDUCTION: This exam was performed according to the departmental dose-optimization program which includes automated exposure control, adjustment of the mA and/or kV according to patient size and/or use of iterative reconstruction technique. COMPARISON:  Prior study from 01/18/2022. FINDINGS: Brain: Age-related cerebral atrophy with chronic small vessel ischemic disease. Remote right basal ganglia lacunar infarct. Additional small remote right cerebellar infarct. No acute intracranial hemorrhage. No acute large vessel territory infarct. No mass lesion or midline shift. No hydrocephalus or extra-axial fluid collection. Vascular: No hyperdense vessel. Scattered vascular  calcifications noted within the carotid siphons. Skull: Scalp soft tissues and calvarium demonstrate no acute finding. Sinuses/Orbits: Left gaze noted. Scattered mucosal thickening noted within the sphenoid ethmoidal sinuses.

## 2022-03-19 NOTE — Consult Note (Signed)
Neurology Consultation ?Reason for Consult: Seizure ?Referring Physician: Stevie Kern, R ? ?CC: Seizures ? ?History is obtained from: Chart review ? ?HPI: Frederick [name] is a 61 y.o. male with a history of heavy alcohol use, seizures history of status epilepticus who was seen to have seizures at his nursing today.  He was reportedly found to have a On EMS arrival, he was given Versed and had some cessation of the seizure activity.  On arrival to the emergency department he was noted to have a left flaccid paresis therefore code stroke was activated.  After reading the CT, he developed left-sided arm and face clonic activity.  He was given 2 mg of Ativan, and this is persistent and therefore he was given 2 g of IV Keppra with cessation of his activity.  He was able to follow commands and answer questions while convulsing.  He reports compliance with his medication. ? ? ?LKW: Unclear ?tpa given?: no, not a stroke ? ? ?ROS:  Unable to obtain due to altered mental status.  ? ?Past Medical History:  ?Diagnosis Date  ? Arthritis   ? Asthma   ? Bilateral lower extremity edema 05/28/2021  ? propping feet up  ? Cough 05/28/2021  ? with clear sputum  ? Dyspnea   ? Dyspnea 05/28/2021  ? with exertion  ? EtOH dependence (HCC)   ? drinks 2-3 of 40ounes a day  ? Fibula fracture   ? Dr Dion Saucier 02-2011  ? Finger osteomyelitis, right (HCC) 05/26/2021  ? right index finger  ? Hernia   ? surgery 01-17-13  ? Hypertension   ? not taking bp meds since may 2022  ? Seizures (HCC)   ? Streptococcal bacteremia 09/09/2021  ? Tuberculosis   ? couple of yrs ago took tx at [facility] departmen per pt on 05-28-2021  ? ? ? ?Family History  ?Problem Relation Age of Onset  ? Diabetes Mother   ? Diabetes Father   ? Hypertension Father   ? ? ? ?Social History:  reports that he has been smoking cigarettes. He has been smoking an average of .5 packs per day. He has never used smokeless tobacco. He reports current alcohol use. He reports that he does  not use drugs. ? ? ?Exam: ?Current vital signs: ?BP (!) 143/100   Pulse 100   Temp 98 ?F (36.7 ?C) (Temporal)   Resp (!) 28   Ht 6\' 1"  (1.854 m)   Wt 78.9 kg   SpO2 98%   BMI 22.95 kg/m?  ?Vital signs in last 24 hours: ?Temp:  [98 ?F (36.7 ?C)] 98 ?F (36.7 ?C) (04/14 2010) ?Pulse Rate:  [100-103] 100 (04/14 2215) ?Resp:  [17-28] 28 (04/14 2215) ?BP: (143-177)/(86-111) 143/100 (04/14 2215) ?SpO2:  [98 %] 98 % (04/14 2215) ?Weight:  [78.9 kg] 78.9 kg (04/14 2000) ? ? ?Physical Exam  ?Constitutional: Appears well-developed and well-nourished.  ?Psych: Affect appropriate to situation ?Eyes: No scleral injection ?HENT: No OP obstruction ?MSK: no joint deformities.  ?Cardiovascular: Normal rate and regular rhythm.  ?Respiratory: Effort normal, non-labored breathing ?GI: Soft.  No distension. There is no tenderness.  ?Skin: WDI ? ?Neuro: ?Mental Status: ?Patient is awake, alert, knows month and where he is.  He is able to tell me his name, but does have some confusion. ?  ?Cranial Nerves: ?II: He is left-handed.. Pupils are equal, round, and reactive to light.   ?III,IV, VI: EOMI without ptosis or diploplia.  ?V: Facial sensation is symmetric to light  touch ?VII: Facial movement is weak on  the left ?Motor: ?He moved the right side well, dense hemiplegia on the left when not convulsing.  ?Sensory: ?Sensation is diminished on the left.  ?Cerebellar: ?Does not perform.  ? ?I have reviewed labs in epic and the results pertinent to this consultation are: ?Na 134 ? ? ?I have reviewed the images obtained: CT head-unremarkable ? ?Impression: 61 year old male presenting with status epilepticus.  It is possible this was precipitated by hypoglycemic episode, but given that he has had multiple episodes, I would favor increasing his Keppra dose. ? ?He initially was in status epilepticus, but this resolved following Keppra after an Ativan dose. ? ?Recommendations: ?1) increase Keppra to 1.5 g twice daily ?2) neurology will  follow ? ? ?Ritta Slot, MD ?[facility] ?[phone] ? ?If 7pm- 7am, please page neurology on call as listed in AMION. ? ?

## 2022-03-19 NOTE — ED Notes (Signed)
2 mg Ativan given as ordered by Neuro provider on CT. ?

## 2022-03-19 NOTE — ED Triage Notes (Signed)
Pt arrive by [facility] from home for c/o seizure activity started today, family found him alter and with tremors present on all extremities.  Pt base line is left side weakness, slurred speech per family worse today. On first responders arrival CBG 35 soda and food provider by them and cbg up to 110. One episode of seizure with EMS on the way to ED 5 mg Midazolam given pta. Last cbg 168. Hx stroke on January/23 ?

## 2022-03-20 ENCOUNTER — Observation Stay (HOSPITAL_COMMUNITY): Payer: Medicare Other

## 2022-03-20 ENCOUNTER — Encounter (HOSPITAL_COMMUNITY): Payer: Self-pay | Admitting: Internal Medicine

## 2022-03-20 DIAGNOSIS — R569 Unspecified convulsions: Secondary | ICD-10-CM | POA: Diagnosis present

## 2022-03-20 DIAGNOSIS — Y92239 Unspecified place in hospital as the place of occurrence of the external cause: Secondary | ICD-10-CM | POA: Diagnosis not present

## 2022-03-20 DIAGNOSIS — R414 Neurologic neglect syndrome: Secondary | ICD-10-CM | POA: Diagnosis present

## 2022-03-20 DIAGNOSIS — D509 Iron deficiency anemia, unspecified: Secondary | ICD-10-CM

## 2022-03-20 DIAGNOSIS — E871 Hypo-osmolality and hyponatremia: Secondary | ICD-10-CM | POA: Diagnosis not present

## 2022-03-20 DIAGNOSIS — G40901 Epilepsy, unspecified, not intractable, with status epilepticus: Secondary | ICD-10-CM | POA: Diagnosis present

## 2022-03-20 DIAGNOSIS — Z9911 Dependence on respirator [ventilator] status: Secondary | ICD-10-CM | POA: Diagnosis not present

## 2022-03-20 DIAGNOSIS — I6330 Cerebral infarction due to thrombosis of unspecified cerebral artery: Secondary | ICD-10-CM

## 2022-03-20 DIAGNOSIS — D638 Anemia in other chronic diseases classified elsewhere: Secondary | ICD-10-CM | POA: Diagnosis present

## 2022-03-20 DIAGNOSIS — E440 Moderate protein-calorie malnutrition: Secondary | ICD-10-CM | POA: Diagnosis present

## 2022-03-20 DIAGNOSIS — J9601 Acute respiratory failure with hypoxia: Secondary | ICD-10-CM | POA: Diagnosis not present

## 2022-03-20 DIAGNOSIS — N390 Urinary tract infection, site not specified: Secondary | ICD-10-CM | POA: Diagnosis present

## 2022-03-20 DIAGNOSIS — G9341 Metabolic encephalopathy: Secondary | ICD-10-CM

## 2022-03-20 DIAGNOSIS — I10 Essential (primary) hypertension: Secondary | ICD-10-CM | POA: Diagnosis present

## 2022-03-20 DIAGNOSIS — Z515 Encounter for palliative care: Secondary | ICD-10-CM | POA: Diagnosis not present

## 2022-03-20 DIAGNOSIS — F1020 Alcohol dependence, uncomplicated: Secondary | ICD-10-CM | POA: Diagnosis present

## 2022-03-20 DIAGNOSIS — J690 Pneumonitis due to inhalation of food and vomit: Secondary | ICD-10-CM | POA: Diagnosis not present

## 2022-03-20 DIAGNOSIS — E162 Hypoglycemia, unspecified: Secondary | ICD-10-CM | POA: Diagnosis present

## 2022-03-20 DIAGNOSIS — T886XXA Anaphylactic reaction due to adverse effect of correct drug or medicament properly administered, initial encounter: Secondary | ICD-10-CM | POA: Diagnosis not present

## 2022-03-20 DIAGNOSIS — E873 Alkalosis: Secondary | ICD-10-CM | POA: Diagnosis not present

## 2022-03-20 DIAGNOSIS — Z7189 Other specified counseling: Secondary | ICD-10-CM | POA: Diagnosis not present

## 2022-03-20 DIAGNOSIS — F1029 Alcohol dependence with unspecified alcohol-induced disorder: Secondary | ICD-10-CM | POA: Diagnosis not present

## 2022-03-20 DIAGNOSIS — J45909 Unspecified asthma, uncomplicated: Secondary | ICD-10-CM | POA: Diagnosis present

## 2022-03-20 DIAGNOSIS — G928 Other toxic encephalopathy: Secondary | ICD-10-CM | POA: Diagnosis present

## 2022-03-20 DIAGNOSIS — Z66 Do not resuscitate: Secondary | ICD-10-CM | POA: Diagnosis not present

## 2022-03-20 DIAGNOSIS — B952 Enterococcus as the cause of diseases classified elsewhere: Secondary | ICD-10-CM | POA: Diagnosis present

## 2022-03-20 DIAGNOSIS — Z7982 Long term (current) use of aspirin: Secondary | ICD-10-CM | POA: Diagnosis not present

## 2022-03-20 DIAGNOSIS — Z91199 Patient's noncompliance with other medical treatment and regimen due to unspecified reason: Secondary | ICD-10-CM | POA: Diagnosis not present

## 2022-03-20 DIAGNOSIS — E11649 Type 2 diabetes mellitus with hypoglycemia without coma: Secondary | ICD-10-CM | POA: Diagnosis present

## 2022-03-20 DIAGNOSIS — F17210 Nicotine dependence, cigarettes, uncomplicated: Secondary | ICD-10-CM | POA: Diagnosis present

## 2022-03-20 DIAGNOSIS — E1165 Type 2 diabetes mellitus with hyperglycemia: Secondary | ICD-10-CM | POA: Diagnosis not present

## 2022-03-20 DIAGNOSIS — Z20822 Contact with and (suspected) exposure to covid-19: Secondary | ICD-10-CM | POA: Diagnosis present

## 2022-03-20 LAB — CBC
HCT: 31.4 % — ABNORMAL LOW (ref 39.0–52.0)
Hemoglobin: 10.1 g/dL — ABNORMAL LOW (ref 13.0–17.0)
MCH: 27.4 pg (ref 26.0–34.0)
MCHC: 32.2 g/dL (ref 30.0–36.0)
MCV: 85.3 fL (ref 80.0–100.0)
Platelets: 204 10*3/uL (ref 150–400)
RBC: 3.68 MIL/uL — ABNORMAL LOW (ref 4.22–5.81)
RDW: 16.4 % — ABNORMAL HIGH (ref 11.5–15.5)
WBC: 6.6 10*3/uL (ref 4.0–10.5)
nRBC: 0 % (ref 0.0–0.2)

## 2022-03-20 LAB — POCT I-STAT 7, (LYTES, BLD GAS, ICA,H+H)
Acid-Base Excess: 1 mmol/L (ref 0.0–2.0)
Bicarbonate: 23.7 mmol/L (ref 20.0–28.0)
Calcium, Ion: 1.25 mmol/L (ref 1.15–1.40)
HCT: 37 % — ABNORMAL LOW (ref 39.0–52.0)
Hemoglobin: 12.6 g/dL — ABNORMAL LOW (ref 13.0–17.0)
O2 Saturation: 94 %
Patient temperature: 97.7
Potassium: 3.6 mmol/L (ref 3.5–5.1)
Sodium: 135 mmol/L (ref 135–145)
TCO2: 25 mmol/L (ref 22–32)
pCO2 arterial: 30.1 mmHg — ABNORMAL LOW (ref 32–48)
pH, Arterial: 7.502 — ABNORMAL HIGH (ref 7.35–7.45)
pO2, Arterial: 61 mmHg — ABNORMAL LOW (ref 83–108)

## 2022-03-20 LAB — CBG MONITORING, ED
Glucose-Capillary: 62 mg/dL — ABNORMAL LOW (ref 70–99)
Glucose-Capillary: 14 mg/dL — CL (ref 70–99)
Glucose-Capillary: 34 mg/dL — CL (ref 70–99)
Glucose-Capillary: 27 mg/dL — CL (ref 70–99)
Glucose-Capillary: 30 mg/dL — CL (ref 70–99)
Glucose-Capillary: 69 mg/dL — ABNORMAL LOW (ref 70–99)

## 2022-03-20 LAB — RESP PANEL BY RT-PCR (FLU A&B, COVID) ARPGX2
Influenza A by PCR: NEGATIVE
Influenza B by PCR: NEGATIVE
SARS Coronavirus 2 by RT PCR: NEGATIVE

## 2022-03-20 LAB — COMPREHENSIVE METABOLIC PANEL
ALT: 7 U/L (ref 0–44)
AST: 12 U/L — ABNORMAL LOW (ref 15–41)
Albumin: 2.1 g/dL — ABNORMAL LOW (ref 3.5–5.0)
Alkaline Phosphatase: 94 U/L (ref 38–126)
Anion gap: 3 — ABNORMAL LOW (ref 5–15)
BUN: 6 mg/dL — ABNORMAL LOW (ref 8–23)
CO2: 22 mmol/L (ref 22–32)
Calcium: 6.9 mg/dL — ABNORMAL LOW (ref 8.9–10.3)
Chloride: 111 mmol/L (ref 98–111)
Creatinine, Ser: 0.69 mg/dL (ref 0.61–1.24)
GFR, Estimated: >60 mL/min (ref >60)
Glucose, Bld: 230 mg/dL — ABNORMAL HIGH (ref 70–99)
Potassium: 3 mmol/L — ABNORMAL LOW (ref 3.5–5.1)
Sodium: 136 mmol/L (ref 135–145)
Total Bilirubin: 0.3 mg/dL (ref 0.3–1.2)
Total Protein: 5.5 g/dL — ABNORMAL LOW (ref 6.5–8.1)

## 2022-03-20 LAB — RAPID URINE DRUG SCREEN, HOSP PERFORMED
Amphetamines: NOT DETECTED
Barbiturates: NOT DETECTED
Benzodiazepines: POSITIVE — AB
Cocaine: NOT DETECTED
Opiates: NOT DETECTED
Tetrahydrocannabinol: POSITIVE — AB

## 2022-03-20 LAB — BASIC METABOLIC PANEL
Anion gap: 4 — ABNORMAL LOW (ref 5–15)
Anion gap: 11 (ref 5–15)
BUN: 5 mg/dL — ABNORMAL LOW (ref 8–23)
BUN: 6 mg/dL — ABNORMAL LOW (ref 8–23)
CO2: 26 mmol/L (ref 22–32)
CO2: 21 mmol/L — ABNORMAL LOW (ref 22–32)
Calcium: 9.1 mg/dL (ref 8.9–10.3)
Calcium: 9.2 mg/dL (ref 8.9–10.3)
Chloride: 102 mmol/L (ref 98–111)
Chloride: 103 mmol/L (ref 98–111)
Creatinine, Ser: 0.82 mg/dL (ref 0.61–1.24)
Creatinine, Ser: 0.88 mg/dL (ref 0.61–1.24)
GFR, Estimated: >60 mL/min (ref >60)
GFR, Estimated: >60 mL/min (ref >60)
Glucose, Bld: 103 mg/dL — ABNORMAL HIGH (ref 70–99)
Glucose, Bld: 109 mg/dL — ABNORMAL HIGH (ref 70–99)
Potassium: 4 mmol/L (ref 3.5–5.1)
Potassium: 4.5 mmol/L (ref 3.5–5.1)
Sodium: 132 mmol/L — ABNORMAL LOW (ref 135–145)
Sodium: 135 mmol/L (ref 135–145)

## 2022-03-20 LAB — AMMONIA: Ammonia: 21 umol/L (ref 9–35)

## 2022-03-20 LAB — URINALYSIS, ROUTINE W REFLEX MICROSCOPIC
Bilirubin Urine: NEGATIVE
Glucose, UA: NEGATIVE mg/dL
Hgb urine dipstick: NEGATIVE
Ketones, ur: NEGATIVE mg/dL
Leukocytes,Ua: NEGATIVE
Nitrite: NEGATIVE
Protein, ur: NEGATIVE mg/dL
Specific Gravity, Urine: >1.046 — ABNORMAL HIGH (ref 1.005–1.030)
pH: 6 (ref 5.0–8.0)

## 2022-03-20 LAB — GLUCOSE, CAPILLARY
Glucose-Capillary: 29 mg/dL — CL (ref 70–99)
Glucose-Capillary: 77 mg/dL (ref 70–99)
Glucose-Capillary: 113 mg/dL — ABNORMAL HIGH (ref 70–99)

## 2022-03-20 LAB — MRSA NEXT GEN BY PCR, NASAL: MRSA by PCR Next Gen: NOT DETECTED

## 2022-03-20 LAB — MAGNESIUM: Magnesium: 1.4 mg/dL — ABNORMAL LOW (ref 1.7–2.4)

## 2022-03-20 LAB — PHOSPHORUS: Phosphorus: 2.4 mg/dL — ABNORMAL LOW (ref 2.5–4.6)

## 2022-03-20 MED ORDER — DOCUSATE SODIUM 50 MG/5ML PO LIQD
100.0000 mg | Freq: Two times a day (BID) | ORAL | Status: DC
  Administered 2022-03-20 – 2022-03-23 (×5): 100 mg
  Filled 2022-03-20 (×6): qty 10

## 2022-03-20 MED ORDER — ASPIRIN 300 MG RE SUPP
300.0000 mg | Freq: Every day | RECTAL | Status: DC
  Filled 2022-03-20 (×22): qty 1

## 2022-03-20 MED ORDER — PANTOPRAZOLE SODIUM 40 MG IV SOLR: 40.0000 mg | Freq: Every day | INTRAVENOUS | Status: DC

## 2022-03-20 MED ORDER — STROKE: EARLY STAGES OF RECOVERY BOOK
Freq: Once | Status: AC
  Filled 2022-03-20: qty 1

## 2022-03-20 MED ORDER — PROPOFOL 1000 MG/100ML IV EMUL
80.0000 ug/kg/min | INTRAVENOUS | Status: DC
  Administered 2022-03-20: 50 ug/kg/min via INTRAVENOUS
  Administered 2022-03-20: 20 ug/kg/min via INTRAVENOUS
  Administered 2022-03-21 – 2022-03-22 (×18): 80 ug/kg/min via INTRAVENOUS
  Administered 2022-03-23: 20 ug/kg/min via INTRAVENOUS
  Administered 2022-03-23: 80 ug/kg/min via INTRAVENOUS
  Administered 2022-03-23: 40 ug/kg/min via INTRAVENOUS
  Administered 2022-03-23 (×4): 80 ug/kg/min via INTRAVENOUS
  Administered 2022-03-24: 20 ug/kg/min via INTRAVENOUS
  Filled 2022-03-20 (×9): qty 100
  Filled 2022-03-20 (×2): qty 200
  Filled 2022-03-20 (×4): qty 100
  Filled 2022-03-20: qty 200
  Filled 2022-03-20 (×4): qty 100
  Filled 2022-03-20: qty 200
  Filled 2022-03-20 (×3): qty 100

## 2022-03-20 MED ORDER — FENTANYL CITRATE PF 50 MCG/ML IJ SOSY: 50.0000 ug | PREFILLED_SYRINGE | INTRAMUSCULAR | Status: DC | PRN

## 2022-03-20 MED ORDER — LORAZEPAM 2 MG/ML IJ SOLN
2.0000 mg | INTRAMUSCULAR | Status: AC
  Administered 2022-03-20: 2 mg via INTRAVENOUS
  Filled 2022-03-20: qty 1

## 2022-03-20 MED ORDER — THIAMINE HCL 100 MG PO TABS: 100.0000 mg | ORAL_TABLET | Freq: Every day | ORAL | Status: DC

## 2022-03-20 MED ORDER — LABETALOL HCL 5 MG/ML IV SOLN: 10.0000 mg | INTRAVENOUS | Status: DC | PRN

## 2022-03-20 MED ORDER — SODIUM CHLORIDE 0.9 % IV SOLN: 1.0000 mg | Freq: Once | INTRAVENOUS | Status: DC

## 2022-03-20 MED ORDER — VALPROATE SODIUM 100 MG/ML IV SOLN
1000.0000 mg | Freq: Once | INTRAVENOUS | Status: AC
  Administered 2022-03-20: 1000 mg via INTRAVENOUS
  Filled 2022-03-20: qty 10

## 2022-03-20 MED ORDER — THIAMINE HCL 100 MG/ML IJ SOLN: 100.0000 mg | Freq: Every day | INTRAMUSCULAR | Status: DC

## 2022-03-20 MED ORDER — FOLIC ACID 1 MG PO TABS: 1.0000 mg | ORAL_TABLET | Freq: Every day | ORAL | Status: DC

## 2022-03-20 MED ORDER — FENTANYL CITRATE (PF) 100 MCG/2ML IJ SOLN
INTRAMUSCULAR | Status: AC
  Administered 2022-03-20: 100 ug
  Filled 2022-03-20: qty 2

## 2022-03-20 MED ORDER — LORAZEPAM 1 MG PO TABS: 1.0000 mg | ORAL_TABLET | ORAL | Status: DC | PRN

## 2022-03-20 MED ORDER — LACOSAMIDE 200 MG/20ML IV SOLN
200.0000 mg | Freq: Once | INTRAVENOUS | Status: AC
  Administered 2022-03-20: 200 mg via INTRAVENOUS
  Filled 2022-03-20: qty 20

## 2022-03-20 MED ORDER — FENTANYL CITRATE (PF) 100 MCG/2ML IJ SOLN
50.0000 ug | INTRAMUSCULAR | Status: DC | PRN
  Administered 2022-03-26 – 2022-03-28 (×2): 50 ug via INTRAVENOUS
  Filled 2022-03-20 (×2): qty 2

## 2022-03-20 MED ORDER — ADULT MULTIVITAMIN W/MINERALS CH
1.0000 | ORAL_TABLET | Freq: Every day | ORAL | Status: DC
  Administered 2022-03-20 – 2022-04-10 (×22): 1
  Filled 2022-03-20 (×22): qty 1

## 2022-03-20 MED ORDER — DEXTROSE 50 % IV SOLN
INTRAVENOUS | Status: AC
  Administered 2022-03-20: 50 mL
  Filled 2022-03-20: qty 50

## 2022-03-20 MED ORDER — METOPROLOL TARTRATE 5 MG/5ML IV SOLN
INTRAVENOUS | Status: AC
  Administered 2022-03-20: 5 mg via INTRAVENOUS
  Filled 2022-03-20: qty 5

## 2022-03-20 MED ORDER — ADULT MULTIVITAMIN W/MINERALS CH: 1.0000 | ORAL_TABLET | Freq: Every day | ORAL | Status: DC

## 2022-03-20 MED ORDER — DEXTROSE-NACL 5-0.9 % IV SOLN
INTRAVENOUS | Status: DC
Start: 2022-03-20 — End: 2022-03-20
  Administered 2022-03-20: 75 mL/h via INTRAVENOUS

## 2022-03-20 MED ORDER — KETAMINE HCL 50 MG/5ML IJ SOSY
PREFILLED_SYRINGE | INTRAMUSCULAR | Status: AC
  Administered 2022-03-20: 79 mg via INTRAVENOUS
  Filled 2022-03-20: qty 5

## 2022-03-20 MED ORDER — ROCURONIUM BROMIDE 10 MG/ML (PF) SYRINGE: 1.0000 mg/kg | PREFILLED_SYRINGE | Freq: Once | INTRAVENOUS | Status: DC

## 2022-03-20 MED ORDER — THIAMINE HCL 100 MG/ML IJ SOLN
100.0000 mg | Freq: Every day | INTRAMUSCULAR | Status: DC
  Administered 2022-03-20: 100 mg via INTRAVENOUS
  Filled 2022-03-20: qty 2

## 2022-03-20 MED ORDER — LACOSAMIDE 200 MG/20ML IV SOLN
100.0000 mg | Freq: Two times a day (BID) | INTRAVENOUS | Status: DC
Start: 2022-03-20 — End: 2022-03-22
  Administered 2022-03-20 – 2022-03-21 (×3): 100 mg via INTRAVENOUS
  Filled 2022-03-20 (×6): qty 10

## 2022-03-20 MED ORDER — SODIUM CHLORIDE 0.9 % IV SOLN
1.0000 mg | Freq: Every day | INTRAVENOUS | Status: DC
  Filled 2022-03-20: qty 0.2

## 2022-03-20 MED ORDER — FENTANYL CITRATE (PF) 100 MCG/2ML IJ SOLN
50.0000 ug | INTRAMUSCULAR | Status: DC | PRN
  Administered 2022-03-27 – 2022-03-28 (×4): 100 ug via INTRAVENOUS
  Administered 2022-03-28: 200 ug via INTRAVENOUS
  Administered 2022-03-28 – 2022-03-29 (×10): 100 ug via INTRAVENOUS
  Filled 2022-03-20 (×11): qty 2
  Filled 2022-03-20: qty 4
  Filled 2022-03-20 (×3): qty 2

## 2022-03-20 MED ORDER — FOLIC ACID 5 MG/ML IJ SOLN
1.0000 mg | Freq: Every day | INTRAMUSCULAR | Status: DC
  Filled 2022-03-20: qty 0.2

## 2022-03-20 MED ORDER — ACETAMINOPHEN 650 MG RE SUPP: 650.0000 mg | RECTAL | Status: DC | PRN

## 2022-03-20 MED ORDER — METOPROLOL TARTRATE 5 MG/5ML IV SOLN
5.0000 mg | Freq: Three times a day (TID) | INTRAVENOUS | Status: DC
  Administered 2022-03-20: 5 mg via INTRAVENOUS

## 2022-03-20 MED ORDER — PROSOURCE TF PO LIQD
45.0000 mL | Freq: Two times a day (BID) | ORAL | Status: DC
  Administered 2022-03-20 – 2022-03-22 (×4): 45 mL
  Filled 2022-03-20 (×4): qty 45

## 2022-03-20 MED ORDER — PANTOPRAZOLE 2 MG/ML SUSPENSION
40.0000 mg | Freq: Every day | ORAL | Status: DC
  Administered 2022-03-20 – 2022-03-29 (×10): 40 mg
  Filled 2022-03-20 (×11): qty 20

## 2022-03-20 MED ORDER — SODIUM CHLORIDE 0.9 % IV SOLN
100.0000 mg | Freq: Two times a day (BID) | INTRAVENOUS | Status: DC
  Filled 2022-03-20 (×2): qty 10

## 2022-03-20 MED ORDER — CHLORHEXIDINE GLUCONATE 0.12% ORAL RINSE (MEDLINE KIT)
15.0000 mL | Freq: Two times a day (BID) | OROMUCOSAL | Status: DC
  Administered 2022-03-20 – 2022-04-10 (×41): 15 mL via OROMUCOSAL

## 2022-03-20 MED ORDER — IOHEXOL 350 MG/ML SOLN
75.0000 mL | Freq: Once | INTRAVENOUS | Status: AC | PRN
  Administered 2022-03-20: 75 mL via INTRAVENOUS

## 2022-03-20 MED ORDER — SUCCINYLCHOLINE CHLORIDE 200 MG/10ML IV SOSY
PREFILLED_SYRINGE | INTRAVENOUS | Status: AC
  Filled 2022-03-20: qty 10

## 2022-03-20 MED ORDER — ACETAMINOPHEN 325 MG PO TABS
650.0000 mg | ORAL_TABLET | ORAL | Status: DC | PRN
  Administered 2022-03-25: 650 mg via ORAL
  Filled 2022-03-20: qty 2

## 2022-03-20 MED ORDER — ACETAMINOPHEN 160 MG/5ML PO SOLN
650.0000 mg | ORAL | Status: DC | PRN
  Administered 2022-04-07: 650 mg
  Filled 2022-03-20: qty 20.3

## 2022-03-20 MED ORDER — POTASSIUM CHLORIDE 10 MEQ/100ML IV SOLN
10.0000 meq | INTRAVENOUS | Status: AC
  Administered 2022-03-20 (×3): 10 meq via INTRAVENOUS
  Filled 2022-03-20 (×4): qty 100

## 2022-03-20 MED ORDER — MAGNESIUM SULFATE 2 GM/50ML IV SOLN
2.0000 g | Freq: Once | INTRAVENOUS | Status: AC
  Administered 2022-03-20: 2 g via INTRAVENOUS
  Filled 2022-03-20: qty 50

## 2022-03-20 MED ORDER — ENOXAPARIN SODIUM 40 MG/0.4ML IJ SOSY
40.0000 mg | PREFILLED_SYRINGE | INTRAMUSCULAR | Status: DC
  Administered 2022-03-20 – 2022-04-10 (×22): 40 mg via SUBCUTANEOUS
  Filled 2022-03-20 (×22): qty 0.4

## 2022-03-20 MED ORDER — KETAMINE HCL 50 MG/5ML IJ SOSY: 1.0000 mg/kg | PREFILLED_SYRINGE | INTRAMUSCULAR | Status: AC

## 2022-03-20 MED ORDER — HYDRALAZINE HCL 20 MG/ML IJ SOLN
10.0000 mg | Freq: Four times a day (QID) | INTRAMUSCULAR | Status: DC | PRN
  Administered 2022-03-25: 10 mg via INTRAVENOUS
  Filled 2022-03-20: qty 1

## 2022-03-20 MED ORDER — THIAMINE HCL 100 MG PO TABS
100.0000 mg | ORAL_TABLET | Freq: Every day | ORAL | Status: DC
  Administered 2022-03-21 – 2022-03-22 (×2): 100 mg via ORAL
  Filled 2022-03-20 (×2): qty 1

## 2022-03-20 MED ORDER — VITAL HIGH PROTEIN PO LIQD
1000.0000 mL | ORAL | Status: DC
  Administered 2022-03-20 – 2022-03-21 (×2): 1000 mL

## 2022-03-20 MED ORDER — ORAL CARE MOUTH RINSE
15.0000 mL | OROMUCOSAL | Status: DC
  Administered 2022-03-20 – 2022-03-31 (×106): 15 mL via OROMUCOSAL

## 2022-03-20 MED ORDER — ROCURONIUM BROMIDE 10 MG/ML (PF) SYRINGE
PREFILLED_SYRINGE | INTRAVENOUS | Status: AC
  Filled 2022-03-20: qty 10

## 2022-03-20 MED ORDER — POLYETHYLENE GLYCOL 3350 17 G PO PACK
17.0000 g | PACK | Freq: Every day | ORAL | Status: DC
  Administered 2022-03-20 – 2022-03-23 (×2): 17 g
  Filled 2022-03-20 (×3): qty 1

## 2022-03-20 MED ORDER — FOLIC ACID 1 MG PO TABS
1.0000 mg | ORAL_TABLET | Freq: Every day | ORAL | Status: DC
  Administered 2022-03-20 – 2022-04-10 (×22): 1 mg
  Filled 2022-03-20 (×22): qty 1

## 2022-03-20 MED ORDER — PROPOFOL BOLUS VIA INFUSION
2.0000 mg/kg | Freq: Once | INTRAVENOUS | Status: AC
  Administered 2022-03-20: 157.8 mg via INTRAVENOUS
  Filled 2022-03-20: qty 158

## 2022-03-20 MED ORDER — METOPROLOL TARTRATE 5 MG/5ML IV SOLN: 5.0000 mg | Freq: Once | INTRAVENOUS | Status: AC

## 2022-03-20 MED ORDER — LEVETIRACETAM IN NACL 1500 MG/100ML IV SOLN
1500.0000 mg | Freq: Two times a day (BID) | INTRAVENOUS | Status: DC
  Administered 2022-03-20 – 2022-03-31 (×23): 1500 mg via INTRAVENOUS
  Filled 2022-03-20 (×24): qty 100

## 2022-03-20 MED ORDER — PROPOFOL 500 MG/50ML IV EMUL
INTRAVENOUS | Status: AC
  Administered 2022-03-20: 20 ug via INTRAVENOUS
  Filled 2022-03-20: qty 50

## 2022-03-20 MED ORDER — ASPIRIN 300 MG RE SUPP
300.0000 mg | Freq: Every day | RECTAL | Status: DC
  Administered 2022-03-20: 300 mg via RECTAL
  Filled 2022-03-20: qty 1

## 2022-03-20 MED ORDER — LORAZEPAM 2 MG/ML IJ SOLN: 1.0000 mg | INTRAMUSCULAR | Status: DC | PRN

## 2022-03-20 MED ORDER — LEVETIRACETAM IN NACL 1000 MG/100ML IV SOLN: 1000.0000 mg | Freq: Two times a day (BID) | INTRAVENOUS | Status: DC

## 2022-03-20 MED ORDER — DEXTROSE-NACL 5-0.9 % IV SOLN: INTRAVENOUS | Status: AC

## 2022-03-20 MED ORDER — CHLORHEXIDINE GLUCONATE CLOTH 2 % EX PADS
6.0000 | MEDICATED_PAD | Freq: Every day | CUTANEOUS | Status: DC
  Administered 2022-03-20 – 2022-04-09 (×19): 6 via TOPICAL

## 2022-03-20 MED ORDER — SODIUM CHLORIDE 0.9 % IV SOLN
2.0000 g | INTRAVENOUS | Status: DC
  Administered 2022-03-20 – 2022-03-21 (×2): 2 g via INTRAVENOUS
  Filled 2022-03-20 (×2): qty 20

## 2022-03-20 MED ORDER — PROPOFOL 500 MG/50ML IV EMUL
INTRAVENOUS | Status: AC
  Filled 2022-03-20: qty 50

## 2022-03-20 MED ORDER — ASPIRIN 325 MG PO TABS
325.0000 mg | ORAL_TABLET | Freq: Every day | ORAL | Status: DC
Start: 2022-03-21 — End: 2022-04-11
  Administered 2022-03-21 – 2022-04-10 (×21): 325 mg
  Filled 2022-03-20 (×21): qty 1

## 2022-03-20 MED ORDER — ETOMIDATE 2 MG/ML IV SOLN
INTRAVENOUS | Status: AC
  Filled 2022-03-20: qty 20

## 2022-03-20 MED ORDER — LORAZEPAM 2 MG/ML IJ SOLN
4.0000 mg | Freq: Once | INTRAMUSCULAR | Status: AC
  Administered 2022-03-20: 4 mg via INTRAVENOUS
  Filled 2022-03-20: qty 2

## 2022-03-20 MED ORDER — LORAZEPAM 2 MG/ML IJ SOLN
INTRAMUSCULAR | Status: AC
  Filled 2022-03-20: qty 1

## 2022-03-20 MED ORDER — ASPIRIN 325 MG PO TABS: 325.0000 mg | ORAL_TABLET | Freq: Every day | ORAL | Status: DC

## 2022-03-20 MED ORDER — THIAMINE HCL 100 MG/ML IJ SOLN
500.0000 mg | Freq: Once | INTRAVENOUS | Status: AC
  Administered 2022-03-20: 500 mg via INTRAVENOUS
  Filled 2022-03-20: qty 5

## 2022-03-20 NOTE — Progress Notes (Signed)
Pt noted to have a decrease in mentation and becoming lethargic.  Pt able to follow simple commands.  Rapid response at bedside.  Dr. Janee Morn notified.  Neuro MD at bedside - CCM consulted and pt transferred to ICU.  Report given to [id number]-[id number] ICU RN at bedside.   ?

## 2022-03-20 NOTE — Progress Notes (Signed)
?PROGRESS NOTE ? ? ? ?Frederick [name]  JGO:[id number] DOB: [birth year] DOA: 03/19/2022 ?PCP: [facility] ? ? ?Chief Complaint  ?Patient presents with  ? Seizures  ? ? ?Brief Narrative:  ?Patient 61 year old gentleman history of seizures, alcohol abuse, hypertension admitted in February 2023 for status epilepticus in the setting of being noncompliant with medication had to be intubated at that time also had a stroke.  Patient presented to the ED with family noticing generalized tonic-clonic activity.  On the way EMS gave patient Versed.  Patient noted on arrival to have a left sided flaccid paralysis and code stroke called.  Head CT done unremarkable.  Patient noted to have some clonic activity left upper and left lower extremity and patient received IV Ativan followed by loading dose of Keppra.  Patient seen by neurology and recommended admission for further evaluation.  Patient also noted to be hypoglycemic in the ED given D50 however bmet done was not consistent with CBGs.  During last admission patient also noted to have multiple episodes of hypoglycemia but was not sure what it was a true hypoglycemia. ? ? ?Assessment & Plan: ?  ?Principal Problem: ?  Seizure (HCC) ?Active Problems: ?  Status epilepticus (HCC) ?  EtOH dependence (HCC) ?  Microcytic anemia ?  Acute metabolic encephalopathy ?  Cerebral thrombosis with cerebral infarction ?  Hypoglycemia ?  HTN (hypertension) ? ?#1 breakthrough seizures/status epilepticus ?-?  Unsure as to whether patient has been compliant with his medication however patient states he has been compliant. ?-Patient seen in consultation by neurology and loaded with Keppra 2 g and home regimen Keppra increased to 1.5 g every 12 hours per neurology ?-EEG pending. ?-Patient seen by neurology this afternoon and due to concerns for ongoing status epilepticus and concern for airway protection neurology recommended transfer to ICU. ?-IV Vimpat added to current regimen as well as  Depakote. ?-Per neurology. ? ?2.  History of recent CVA on aspirin and Pletal ?-Patient currently n.p.o. due to problem #1. ?-Once patient more alert and able to tolerate oral intake may resume home regimen of aspirin and Pletal. ? ?3.  Hypertension ?-Was on Coreg. ?-Placed on IV Lopressor, for BP control. ?-Hydralazine as needed. ? ?4.  History of alcohol abuse ?-Continue thiamine, folic acid. ?-Continue Ativan withdrawal protocol. ? ?5.  Hypoglycemia ?-Patient noted to have a hypoglycemic event on presentation however unsure of accuracy of CBGs as basic metabolic profile not consistent with CBGs. ?-Hemoglobin A1c 5.9 (01/18/2022) ?-Noted similarly during last hospitalization. ?-Currently on D5 normal saline. ?-Check basic metabolic profile every 4 hours for the next 1 to 2 days.. ?-Follow. ? ?6.  History of pulmonary tuberculosis treated 2016 ? ?7.  History of PE ?-Currently off anticoagulation. ? ?8.  Anemia ?-Patient with no overt bleeding. ?-Likely anemia of chronic disease. ?-Check an anemia panel. ?-Transfusion threshold hemoglobin < 7. ? ?9.  Acute metabolic encephalopathy ?-Secondary to problem #1. ?-Keep NPO. ? ? ? ?DVT prophylaxis: Lovenox ?Code Status: Full ?Family Communication: No family at bedside ?Disposition:  ? ?Status is: Observation>>> inpatient ?The patient will require care spanning > 2 midnights and should be moved to inpatient because: Severity of illness/concern for status epilepticus requiring ongoing EEG and transfer to the ICU ?  ?Consultants:  ?Neurology: Dr. Amada Jupiter 03/19/2022 ?PCCM pending ? ?Procedures:  ?EEG ?CT angiogram head and neck 03/20/2022 ?CT head without contrast 03/19/2022 ? ? ?Antimicrobials:  ?None ? ? ?Subjective: ?Patient laying on gurney in the ED about to be transferred  to the floor.  Drowsy/lethargic but arouses to verbal stimuli and answer some questions.  Denies any chest pain.  No shortness of breath.  No abdominal pain.  When asked why he is in the hospital  states because of his leg and does not elaborate any further. ?Was called by RN around 3:35 PM that neurology evaluated patient and feel patient is in status epilepticus and recommending transfer to the ICU due to concern for airway protection and further management. ? ? ?Objective: ?Vitals:  ? 03/20/22 0615 03/20/22 1025 03/20/22 1035 03/20/22 1500  ?BP: (!) 152/90 (!) 148/79  (!) 176/108  ?Pulse: 80 83  94  ?Resp: 18     ?Temp:   98.6 ?F (37 ?C) 99.6 ?F (37.6 ?C)  ?TempSrc:    Axillary  ?SpO2: 96% 100%  100%  ?Weight:      ?Height:      ? ? ?Intake/Output Summary (Last 24 hours) at 03/20/2022 1557 ?Last data filed at 03/20/2022 1519 ?Gross per 24 hour  ?Intake 1247.5 ml  ?Output --  ?Net 1247.5 ml  ? ?Filed Weights  ? 03/19/22 2000  ?Weight: 78.9 kg  ? ? ?Examination: ? ?General exam: Drowsy/lethargic.  Dry mucous membranes.  ?Respiratory system: CTA B.  No wheezes, no crackles, no rhonchi.  Fair air movement.  No use of accessory muscles of respiration. ?Cardiovascular system: S1 & S2 heard, RRR. No JVD, murmurs, rubs, gallops or clicks. No pedal edema. ?Gastrointestinal system: Abdomen is nondistended, soft and nontender. No organomegaly or masses felt. Normal bowel sounds heard. ?Central nervous system: Lethargic/drowsy.  Unable to assess neurological exam at this time.  Moving extremities spontaneously. ?Extremities: Symmetric 5 x 5 power. ?Skin: No rashes, lesions or ulcers ?Psychiatry: Judgement and insight unable to assess.  Mood & affect unable to assess. ? ? ? ?Data Reviewed: I have personally reviewed following labs and imaging studies ? ?CBC: ?Recent Labs  ?Lab 03/19/22 ?2035 03/19/22 ?2304 03/20/22 ?[id number]  ?WBC 8.6  --  6.6  ?NEUTROABS 5.7  --   --   ?HGB 12.1* 14.3 10.1*  ?HCT 40.6 42.0 31.4*  ?MCV 88.8  --  85.3  ?PLT 295  --  204  ? ? ?Basic Metabolic Panel: ?Recent Labs  ?Lab 03/19/22 ?2035 03/19/22 ?2304 03/20/22 ?[id number] 03/20/22 ?1448  ?NA 135 134* 136 132*  ?K 4.3 4.3 3.0* 4.0  ?CL 102 101 111 102   ?CO2 20*  --  22 26  ?GLUCOSE 185* 176* 230* 103*  ?BUN 9 10 6* 5*  ?CREATININE 0.97 0.80 0.69 0.82  ?CALCIUM 9.2  --  6.9* 9.1  ? ? ?GFR: ?Estimated Creatinine Clearance: 105.6 mL/min (by C-G formula based on SCr of 0.82 mg/dL). ? ?Liver Function Tests: ?Recent Labs  ?Lab 03/19/22 ?2035 03/20/22 ?[id number]  ?AST 18 12*  ?ALT 8 7  ?ALKPHOS 133* 94  ?BILITOT 0.3 0.3  ?PROT 7.9 5.5*  ?ALBUMIN 3.0* 2.1*  ? ? ?CBG: ?Recent Labs  ?Lab 03/20/22 ?[id number] 03/20/22 ?0530 03/20/22 ?[id number] 03/20/22 ?0908 03/20/22 ?1038  ?GLUCAP 34* 30* 27* 69* 29*  ? ? ? ?Recent Results (from the past 240 hour(s))  ?Resp Panel by RT-PCR (Flu A&B, Covid) Nasopharyngeal Swab     Status: None  ? Collection Time: 03/20/22 12:25 AM  ? Specimen: Nasopharyngeal Swab; Nasopharyngeal(NP) swabs in vial transport medium  ?Result Value Ref Range Status  ? SARS Coronavirus 2 by RT PCR NEGATIVE NEGATIVE Final  ?  Comment: (NOTE) ?SARS-CoV-2 target nucleic acids are NOT DETECTED. ? ?  The SARS-CoV-2 RNA is generally detectable in upper respiratory ?specimens during the acute phase of infection. The lowest ?concentration of SARS-CoV-2 viral copies this assay can detect is ?138 copies/mL. A negative result does not preclude SARS-Cov-2 ?infection and should not be used as the sole basis for treatment or ?other patient management decisions. A negative result may occur with  ?improper specimen collection/handling, submission of specimen other ?than nasopharyngeal swab, presence of viral mutation(s) within the ?areas targeted by this assay, and inadequate number of viral ?copies(<138 copies/mL). A negative result must be combined with ?clinical observations, patient history, and epidemiological ?information. The expected result is Negative. ? ?Fact Sheet for Patients:  ?BloggerCourse.com ? ?Fact Sheet for Healthcare Providers:  ?SeriousBroker.it ? ?This test is no t yet approved or cleared by the [place] FDA and  ?has  been authorized for detection and/or diagnosis of SARS-CoV-2 by ?FDA under an Emergency Use Authorization (EUA). This EUA will remain  ?in effect (meaning this test can be used) for the duration of the ?COVID-19 decla

## 2022-03-20 NOTE — Progress Notes (Signed)
CBG 29 - per RN from ED, pt CBG does not correlate with CMP.  Dextrose/NS infusion running.  MD notified at 10:40.  Will continue to monitor.   ?

## 2022-03-20 NOTE — Progress Notes (Deleted)
Eeg complete, pending results  ?

## 2022-03-20 NOTE — Plan of Care (Signed)

## 2022-03-20 NOTE — Progress Notes (Signed)
EEG complete. Pending results ?

## 2022-03-20 NOTE — Progress Notes (Signed)
NEUROLOGY CONSULTATION PROGRESS NOTE  ? ?Date of service: March 20, 2022 ?Patient Name: Frederick [name] ?MRN:  [id number] ?DOB:  1961-05-02 ?  ?Interval Hx  ? ?Noted LUE twitching with intact mentation. The episode highly concerning for R hemispheric seizure. Given intact mentation, initially decided to uptitrate AEDS gradually with Vimpat load and then followed by Valproic acid. Mentation declined with sonorous respirations, discussed with PCCM team and will intubate, given 4mg  of Ativan and start Propofol at 62mcg/Kg/min. ? ?Vitals  ? ?Vitals:  ? 03/20/22 0615 03/20/22 1025 03/20/22 1035 03/20/22 1500  ?BP: (!) 152/90 (!) 148/79  (!) 176/108  ?Pulse: 80 83  94  ?Resp: 18     ?Temp:   98.6 ?F (37 ?C) 99.6 ?F (37.6 ?C)  ?TempSrc:    Axillary  ?SpO2: 96% 100%  100%  ?Weight:      ?Height:      ?  ? ?Body mass index is 22.95 kg/m?. ? ?Physical Exam  ? ?General: Laying comfortably in bed; in no acute distress.  ?HENT: Normal oropharynx and mucosa. Normal external appearance of ears and nose.  ?Neck: Supple, no pain or tenderness  ?CV: No JVD. No peripheral edema.  ?Pulmonary: Symmetric Chest rise. Normal respiratory effort.  ?Abdomen: Soft to touch, non-tender.  ?Ext: No cyanosis, edema, or deformity  ?Skin: No rash. Normal palpation of skin.   ?Musculoskeletal: Normal digits and nails by inspection. No clubbing.  ? ?Neurologic Examination  ?Mental status/Cognition: somnolent but opens eyes to voice, makes eye contact. Oriented to self, poor attention. Easily agitated. ?Speech/language: Non fluent, comprehension intact to simple commands. Object naming intact. ?Cranial nerves:  ? CN II Pupils equal and reactive to light, no VF deficits  ? CN III,IV,VI EOM intact, no gaze preference or deviation, no nystagmus  ? CN V normal sensation in V1, V2, and V3 segments bilaterally  ? CN VII no asymmetry, no nasolabial fold flattening  ? CN VIII normal hearing to speech   ? CN IX & X normal palatal elevation, no uvular deviation   ?  CN XI 5/5 head turn and 5/5 shoulder shrug bilaterally   ? CN XII midline tongue protrusion   ? ?Motor:  ?Muscle bulk: poor, rhythmic twitching in L hand. Able to move all extremities spontaneously. ? ?Can lift BL upper extremities off the bed with persistent rhythmic twitching of LUE. ?Localizes to pain in BL lower extremities with spontaneous movements in BL lower extremities. Barely able to lift BL lower extremities off the bed. ? ?Sensation: ? Light touch Intact throughout.  ? Pin prick   ? Temperature   ? Vibration   ?Proprioception   ? ?Coordination/Complex Motor:  ?Uanble to assess due to agitation. ?- Gait: Deferred for patient safety. ? ?Labs  ? ?Basic Metabolic Panel:  ?Lab Results  ?Component Value Date  ? NA 132 (L) 03/20/2022  ? K 4.0 03/20/2022  ? CO2 26 03/20/2022  ? GLUCOSE 103 (H) 03/20/2022  ? BUN 5 (L) 03/20/2022  ? CREATININE 0.82 03/20/2022  ? CALCIUM 9.1 03/20/2022  ? GFRNONAA >60 03/20/2022  ? GFRAA >60 04/27/2020  ? ?HbA1c:  ?Lab Results  ?Component Value Date  ? HGBA1C 5.9 (H) 01/18/2022  ? ?LDL:  ?Lab Results  ?Component Value Date  ? LDLCALC 110 (H) 01/18/2022  ? ?Urine Drug Screen:  ?   ?Component Value Date/Time  ? LABOPIA NONE DETECTED 03/20/2022 1204  ? COCAINSCRNUR NONE DETECTED 03/20/2022 1204  ? LABBENZ POSITIVE (A) 03/20/2022 1204  ?  AMPHETMU NONE DETECTED 03/20/2022 1204  ? THCU POSITIVE (A) 03/20/2022 1204  ? LABBARB NONE DETECTED 03/20/2022 1204  ?  ?Alcohol Level  ?   ?Component Value Date/Time  ? ETH <10 01/05/2022 1717  ? ?No results found for: PHENYTOIN, ZONISAMIDE, LAMOTRIGINE, LEVETIRACETA ?No results found for: PHENYTOIN, PHENOBARB, VALPROATE, CBMZ ? ?Imaging and Diagnostic studies  ?Results for orders placed during the hospital encounter of 03/19/22 ? ?CT HEAD CODE STROKE WO CONTRAST ? ?Narrative ?CLINICAL DATA:  Code stroke. Initial evaluation for neuro deficit, ?stroke suspected. ? ?EXAM: ?CT HEAD WITHOUT CONTRAST ? ?TECHNIQUE: ?Contiguous axial images were obtained  from the base of the skull ?through the vertex without intravenous contrast. ? ?RADIATION DOSE REDUCTION: This exam was performed according to the ?departmental dose-optimization program which includes automated ?exposure control, adjustment of the mA and/or kV according to ?patient size and/or use of iterative reconstruction technique. ? ?COMPARISON:  Prior study from 01/18/2022. ? ?FINDINGS: ?Brain: Age-related cerebral atrophy with chronic small vessel ?ischemic disease. Remote right basal ganglia lacunar infarct. ?Additional small remote right cerebellar infarct. No acute ?intracranial hemorrhage. No acute large vessel territory infarct. No ?mass lesion or midline shift. No hydrocephalus or extra-axial fluid ?collection. ? ?Vascular: No hyperdense vessel. Scattered vascular calcifications ?noted within the carotid siphons. ? ?Skull: Scalp soft tissues and calvarium demonstrate no acute ?finding. ? ?Sinuses/Orbits: Left gaze noted. Scattered mucosal thickening noted ?within the sphenoid ethmoidal sinuses. No mastoid effusion. ? ?Other: None. ? ?ASPECTS [facility] Stroke Program Early CT Score) ? ?- Ganglionic level infarction (caudate, lentiform nuclei, internal ?capsule, insula, M1-M3 cortex): 7 ? ?- Supraganglionic infarction (M4-M6 cortex): 3 ? ?Total score (0-10 with 10 being normal): 10 ? ?IMPRESSION: ?1. No acute intracranial abnormality. ?2. ASPECTS is 10. ?3. Age-related cerebral atrophy with chronic small vessel ischemic ?disease, with a few small remote infarcts about the right basal ?ganglia and right cerebellum. ? ?These results were communicated to Dr. Amada Jupiter at 10:39 pm on ?03/19/2022 by text page via the [facility] messaging system. ? ? ?Electronically Signed ?By: Rise Mu M.D. ?On: 03/19/2022 22:40 ? ? ?Impression  ? ?Frederick [name] is a 61 year old male presenting with focal status with LUE twitching. Initially felt to be due to hypoglycemia. Episodes initially resolved after Ativan and  Keppra load but with recurrent LUE twitching. Initially treated conservatively with Vimpat, Depakote but his mentation seems to be worsening and cEEG demosntrates R hemispheric focal status that now seems to spreading over to the left hemisphere. ? ?I would treat this agressively at this poin specially with worsening mentation and sonorous respirations. Would prefer to transfer to ICU, intubate and give Ativan 4mg  and start propofol at 75mcg/Kg/min if blood pressure tolerates. ? ?Recommendations  ?- Transfer to ICU, recommend intubation to assist with airway protection. ?- Recommend propofol @ 78mcg/Kg/min once intubated. ?- Ativan 1-2mg  for seizure lasting more than 3 mins. ?- Keppra 1500mg  BID ?- Vimpat 200mg  IV once and will start 100mg  BID ?- Valproic acid 1000mg  IV once. ?- Thiamine 100mg  daily PO or IV. ? ?___________________________________________________________________ ? ?This patient is critically ill and at significant risk of neurological worsening, death and care requires constant monitoring of vital signs, hemodynamics,respiratory and cardiac monitoring, neurological assessment, discussion with family, other specialists and medical decision making of high complexity. I spent 90 minutes of neurocritical care time  in the care of  this patient. This was time spent independent of any time provided by nurse practitioner or PA. ? ?45m ?[facility] ?Pager Number ?  03/20/2022  4:25 PM ? ? ?Thank you for the opportunity to take part in the care of this patient. If you have any further questions, please contact the neurology consultation attending. ? ?Signed, ? ?Erick BlinksSalman Babe Anthis ?[facility] ?Pager Number [phone] ? ?

## 2022-03-20 NOTE — H&P (Addendum)
?History and Physical  ? ? ?Frederick [name] [id number] DOB: 18-Apr-1961 DOA: 03/19/2022 ? ?PCP: [facility]  ?Patient coming from: Home. ? ?Chief Complaint: Seizure. ? ?HPI: Frederick [name] is a 61 y.o. male with history of seizure, alcohol abuse, pulmonary diverticulosis, hypertension who was admitted in February 2 months ago for status epilepticus in the setting of patient being noncompliant with his medication had to be intubated at the time and also had a stroke was found to have generalized tonic-clonic activity by patient's family and was brought to the ER.  On the way EMS had given patient Versed. ? ?ED Course: On arrival in the ER patient was found to have left-sided flaccid paralysis and a code stroke was called.  CT head is unremarkable.  Following the CT head patient had clonic activity on the left upper and lower extremity.  Patient was given 2 mg IV Ativan followed by Keppra 2 g loading dose following which his chronic activities of his left upper and lower extremity stop.  Neurologist at this time felt the patient's symptoms were from seizures and admitted for further observation.  At the time of my exam patient is lethargic from sedation.  Patient was noticed to be hypoglycemic in the ER and was given D50.  During last admission also patient was noticed to have multiple episodes of hypoglycemia but was not sure if it was true hypoglycemia. ? ?Review of Systems: As per HPI, rest all negative. ? ? ?Past Medical History:  ?Diagnosis Date  ? Arthritis   ? Asthma   ? Bilateral lower extremity edema 05/28/2021  ? propping feet up  ? Cough 05/28/2021  ? with clear sputum  ? Dyspnea   ? Dyspnea 05/28/2021  ? with exertion  ? EtOH dependence ([place])   ? drinks 2-3 of 40ounes a day  ? Fibula fracture   ? Dr Mardelle Matte 02-2011  ? Finger osteomyelitis, right ([place]) 05/26/2021  ? right index finger  ? Hernia   ? surgery 01-17-13  ? Hypertension   ? not taking bp meds since may 2022  ? Seizures ([place])   ? Streptococcal  bacteremia 09/09/2021  ? Tuberculosis   ? couple of yrs ago took tx at [place] [facility] per pt on 05-28-2021  ? ? ?Past Surgical History:  ?Procedure Laterality Date  ? AMPUTATION Right 05/29/2021  ? Procedure: PARTIAL RIGHT 2ND FINGER AMPUTATION;  Surgeon: Mcarthur Rossetti, MD;  Location: [facility];  Service: Orthopedics;  Laterality: Right;  ? ANKLE SURGERY    ? INGUINAL HERNIA REPAIR Right 01/19/2013  ? Procedure: HERNIA REPAIR INGUINAL ADULT;  Surgeon: Gayland Curry, MD,FACS;  Location: [place];  Service: General;  Laterality: Right;  ? TEE WITHOUT CARDIOVERSION N/A 08/14/2021  ? Procedure: TRANSESOPHAGEAL ECHOCARDIOGRAM (TEE);  Surgeon: Josue Hector, MD;  Location: [facility] ENDOSCOPY;  Service: Cardiovascular;  Laterality: N/A;  ? TIBIA IM NAIL INSERTION Left 01/22/2013  ? Procedure: INTRAMEDULLARY (IM) NAIL TIBIAL;  Surgeon: Johnny Bridge, MD;  Location: [place];  Service: Orthopedics;  Laterality: Left;  ? ? ? reports that he has been smoking cigarettes. He has been smoking an average of .5 packs per day. He has never used smokeless tobacco. He reports current alcohol use. He reports that he does not use drugs. ? ?No Known Allergies ? ?Family History  ?Problem Relation Age of Onset  ? Diabetes Mother   ? Diabetes Father   ? Hypertension Father   ? ? ?Prior  to Admission medications   ?Medication Sig Start Date End Date Taking? Authorizing Provider  ?albuterol (VENTOLIN HFA) 108 (90 Base) MCG/ACT inhaler Inhale 2 puffs into the lungs every 4 (four) hours as needed for shortness of breath or wheezing. 12/06/19   [provider]  ?aspirin 81 MG chewable tablet Place 1 tablet (81 mg total) into feeding tube daily. 01/23/22   Ghimire, Henreitta Leber, MD  ?atorvastatin (LIPITOR) 40 MG tablet Take 1 tablet (40 mg total) by mouth daily. 01/23/22   Ghimire, Henreitta Leber, MD  ?carvedilol (COREG) 25 MG tablet Take 1 tablet (25 mg total) by mouth 2 (two) times daily with a meal. 01/22/22    Ghimire, Henreitta Leber, MD  ?cetirizine (ZYRTEC) 10 MG tablet Take 10 mg by mouth daily as needed for allergies. 12/06/21   [provider]  ?cilostazol (PLETAL) 100 MG tablet Take 1 tablet (100 mg total) by mouth 2 (two) times daily. 01/22/22   Ghimire, Henreitta Leber, MD  ?cyanocobalamin 100 MCG tablet Take 5 tablets (500 mcg total) by mouth daily. 01/22/22   Ghimire, Henreitta Leber, MD  ?diclofenac Sodium (VOLTAREN) 1 % GEL Apply 4 g topically 4 (four) times daily as needed for pain. 10/26/21   [provider]  ?feeding supplement (ENSURE ENLIVE / ENSURE PLUS) LIQD Take 237 mLs by mouth 3 (three) times daily between meals. 08/28/21   Regalado, Belkys A, MD  ?folic acid (FOLVITE) 1 MG tablet Take 1 tablet (1 mg total) by mouth daily. 08/29/21   Regalado, Belkys A, MD  ?ipratropium-albuterol (DUONEB) 0.5-2.5 (3) MG/3ML SOLN Take 3 mLs by nebulization every 6 (six) hours as needed. 08/28/21   Regalado, Belkys A, MD  ?levETIRAcetam (KEPPRA) 1000 MG tablet Take 1 tablet (1,000 mg total) by mouth 2 (two) times daily. 08/28/21   Regalado, Belkys A, MD  ?magnesium oxide (MAG-OX) 400 (240 Mg) MG tablet Take 0.5 tablets (200 mg total) by mouth 2 (two) times daily. 08/28/21   Regalado, Jerald Kief A, MD  ?Multiple Vitamin (MULTIVITAMIN WITH MINERALS) TABS tablet Take 1 tablet by mouth daily. 08/28/21   Regalado, Belkys A, MD  ?nicotine (NICODERM CQ - DOSED IN MG/24 HOURS) 21 mg/24hr patch Place 1 patch (21 mg total) onto the skin daily. ?Patient not taking: Reported on 01/06/2022 08/29/21   Niel Hummer A, MD  ?QUEtiapine (SEROQUEL) 25 MG tablet Take 1 tablet (25 mg total) by mouth at bedtime. 01/22/22   Ghimire, Henreitta Leber, MD  ?sildenafil (VIAGRA) 100 MG tablet Take 100 mg by mouth daily as needed for erectile dysfunction. 10/31/21   [provider]  ?thiamine 100 MG tablet Take 1 tablet (100 mg total) by mouth daily. ?Patient not taking: Reported on 01/06/2022 08/29/21   Niel Hummer A, MD  ?ferrous sulfate 325 (65 FE) MG  tablet Take 1 tablet (325 mg total) by mouth 2 (two) times daily with a meal. ?Patient not taking: Reported on 02/12/2020 12/03/15 04/17/20  Janece Canterbury, MD  ?zolpidem (AMBIEN) 5 MG tablet Take 1 tablet (5 mg total) by mouth at bedtime as needed for sleep. ?Patient not taking: Reported on 02/12/2020 12/02/15 04/17/20  Hosie Poisson, MD  ? ? ?Physical Exam: ?Constitutional: Moderately built and nourished. ?Vitals:  ? 03/19/22 2215 03/19/22 2300 03/19/22 2315 03/20/22 0130  ?BP: (!) 143/100 (!) 141/76 (!) 156/97 117/61  ?Pulse: 100 85 83 85  ?Resp: (!) 28 (!) 22 (!) 21 (!) 22  ?Temp:      ?TempSrc:      ?SpO2: 98%  100%  96%  ?Weight:      ?Height:      ? ?Eyes: Anicteric no pallor. ?ENMT: No discharge from the ears eyes nose and mouth. ?Neck: No mass felt.  No neck rigidity. ?Respiratory: No rhonchi or crepitations. ?Cardiovascular: S1-S2 heard. ?Abdomen: Soft nontender bowel sound present. ?Musculoskeletal: No edema. ?Skin: No rash. ?Neurologic: Patient is lethargic easily arousable pupils are reactive to light moving all extremities. ?Psychiatric: Lethargic. ? ? ?Labs on Admission: I have personally reviewed following labs and imaging studies ? ?CBC: ?Recent Labs  ?Lab 03/19/22 ?2035 03/19/22 ?2304  ?WBC 8.6  --   ?NEUTROABS 5.7  --   ?HGB 12.1* 14.3  ?HCT 40.6 42.0  ?MCV 88.8  --   ?PLT 295  --   ? ?Basic Metabolic Panel: ?Recent Labs  ?Lab 03/19/22 ?2035 03/19/22 ?2304  ?NA 135 134*  ?K 4.3 4.3  ?CL 102 101  ?CO2 20*  --   ?GLUCOSE 185* 176*  ?BUN 9 10  ?CREATININE 0.97 0.80  ?CALCIUM 9.2  --   ? ?GFR: ?Estimated Creatinine Clearance: 108.2 mL/min (by C-G formula based on SCr of 0.8 mg/dL). ?Liver Function Tests: ?Recent Labs  ?Lab 03/19/22 ?2035  ?AST 18  ?ALT 8  ?ALKPHOS 133*  ?BILITOT 0.3  ?PROT 7.9  ?ALBUMIN 3.0*  ? ?No results for input(s): LIPASE, AMYLASE in the last 168 hours. ?No results for input(s): AMMONIA in the last 168 hours. ?Coagulation Profile: ?No results for input(s): INR, PROTIME in the last 168  hours. ?Cardiac Enzymes: ?No results for input(s): CKTOTAL, CKMB, CKMBINDEX, TROPONINI in the last 168 hours. ?BNP (last 3 results) ?No results for input(s): PROBNP in the last 8760 hours. ?HbA1C: ?No res

## 2022-03-20 NOTE — Evaluation (Signed)
Clinical/Bedside Swallow Evaluation ?Patient Details  ?Name: Frederick [name] ?MRN: [id number] ?Date of Birth: September 16, 1961 ? ?Today's Date: 03/20/2022 ?Time: SLP Start Time (ACUTE ONLY): 1335 SLP Stop Time (ACUTE ONLY): 1350 ?SLP Time Calculation (min) (ACUTE ONLY): 15 min ? ?Past Medical History:  ?Past Medical History:  ?Diagnosis Date  ? Arthritis   ? Asthma   ? Bilateral lower extremity edema 05/28/2021  ? propping feet up  ? Cough 05/28/2021  ? with clear sputum  ? Dyspnea   ? Dyspnea 05/28/2021  ? with exertion  ? EtOH dependence ([facility])   ? drinks 2-3 of 40ounes a day  ? Fibula fracture   ? Dr Dion Saucier 02-2011  ? Finger osteomyelitis, right ([facility]) 05/26/2021  ? right index finger  ? Hernia   ? surgery 01-17-13  ? Hypertension   ? not taking bp meds since may 2022  ? Seizures ([facility])   ? Streptococcal bacteremia 09/09/2021  ? Tuberculosis   ? couple of yrs ago took tx at [facility] per pt on 05-28-2021  ? ?Past Surgical History:  ?Past Surgical History:  ?Procedure Laterality Date  ? AMPUTATION Right 05/29/2021  ? Procedure: PARTIAL RIGHT 2ND FINGER AMPUTATION;  Surgeon: Kathryne Hitch, MD;  [facility];  Service: Orthopedics;  Laterality: Right;  ? ANKLE SURGERY    ? INGUINAL HERNIA REPAIR Right 01/19/2013  ? Procedure: HERNIA REPAIR INGUINAL ADULT;  Surgeon: [name], MD,FACS;  Location: [facility] OR;  Service: General;  Laterality: Right;  ? TEE WITHOUT CARDIOVERSION N/A 08/14/2021  ? Procedure: TRANSESOPHAGEAL ECHOCARDIOGRAM (TEE);  Surgeon: Wendall Stade, MD;  Location[facility] END[facility];  Service: Cardiovascular;  Laterality: N/A;  ? TIBIA IM NAIL INSERTION Left 01/22/2013  ? Procedure: INTRAMEDULLARY (IM) NAIL TIBIAL;  Surgeon: [name], MD;  Location: [facility] OR;  Service: Orthopedics;  Laterality: Left;  ? ?HPI:  ?Frederick [name] is a 61 y.o. male with history of seizure, alcohol abuse, pulmonary diverticulosis, and hypertension.  He was admitted in February of this year  for status epilepticus in the setting of patient being noncompliant with his medication.  He had to be intubated at the time and also had a stroke.  For this admission he was found to have generalized tonic-clonic activity by patient's family and was brought to the ER.  On the way EMS had given patient Versed.  CT of the head was showing no acute intracranial abnormality.  He currently has a continous EEG running.  ?  ?Assessment / Plan / Recommendation  ?Clinical Impression ? Clinical swallowing evaluation was completed using ice chips, thin liquids via spoon and puree material.  RN approved patient for PO intake but reported that he's been intermittently responsive.  He is known to ST service from previous admission in February 2023.  He was intially recommended to be NPO following clinical swallowing evaluation.  He ultimately had a MBS on 01/19/2022 with recommendation for a dysphagia 3 diet with thin liquids.  Today his eyes were open.  He was able to state his name and the month and date of his birthday.  He was unable to follow any commands even given physical cues.  HIs mentation appears to be his biggest risk factor for aspiration.  He accepted PO trials.  Oral maniuplation was seen given ice chips and thin liquids via spoon.  No oral manipulation was seen given pureed material and it was suctioned from his oral cavity.  Delayed cough was seen given  thin liquids.  Given clinical presentation suggest he remain NPO including medication.  ST will continue to follow to assess for PO readiness. ?SLP Visit Diagnosis: Dysphagia, unspecified (R13.10) ?   ?Aspiration Risk ? Severe aspiration risk  ?  ?Diet Recommendation   NPO ? ?Medication Administration: Via alternative means  ?  ?Other  Recommendations Oral Care Recommendations: Oral care BID   ? ?Recommendations for follow up therapy are one component of a multi-disciplinary discharge planning process, led by the attending physician.  Recommendations may be  updated based on patient status, additional functional criteria and insurance authorization. ? ?Follow up Recommendations Other (comment) (TBD)  ? ? ?  ?Assistance Recommended at Discharge Other (comment) (TBD)  ?Functional Status Assessment Patient has had a recent decline in their functional status and demonstrates the ability to make significant improvements in function in a reasonable and predictable amount of time.  ?Frequency and Duration min 2x/week  ?2 weeks ?  ?   ? ?Prognosis Prognosis for Safe Diet Advancement: Fair ?Barriers to Reach Goals: Cognitive deficits  ? ?  ? ?Swallow Study   ?General Date of Onset: 03/19/22 ?HPI: Frederick [name] is a 61 y.o. male with history of seizure, alcohol abuse, pulmonary diverticulosis, and hypertension.  He was admitted in February of this year for status epilepticus in the setting of patient being noncompliant with his medication.  He had to be intubated at the time and also had a stroke.  For this admission he was found to have generalized tonic-clonic activity by patient's family and was brought to the ER.  On the way EMS had given patient Versed.  CT of the head was showing no acute intracranial abnormality.  He currently has a continous EEG running. ?Type of Study: Bedside Swallow Evaluation ?Previous Swallow Assessment: 01/19/22 MBS  Rx D3/thin ?Diet Prior to this Study: NPO ?Temperature Spikes Noted: No ?Respiratory Status: Room air ?History of Recent Intubation: No ?Behavior/Cognition: Doesn't follow directions;Confused ?Oral Cavity Assessment: Excessive secretions ?Oral Care Completed by SLP: Yes ?Self-Feeding Abilities: Total assist ?Patient Positioning: Partially reclined ?Baseline Vocal Quality: Not observed ?Volitional Swallow: Unable to elicit  ?  ?Oral/Motor/Sensory Function Overall Oral Motor/Sensory Function: Other (comment) (Unable to assess)   ?Ice Chips Ice chips: Impaired ?Presentation: Spoon ?Oral Phase Impairments: Impaired mastication ?Oral Phase  Functional Implications: Prolonged oral transit   ?Thin Liquid Thin Liquid: Impaired ?Presentation: Spoon ?Oral Phase Impairments: Poor awareness of bolus ?Oral Phase Functional Implications: Right anterior spillage;Left anterior spillage ?Pharyngeal  Phase Impairments: Cough - Delayed  ?  ?Nectar Thick Nectar Thick Liquid: Not tested   ?Honey Thick Honey Thick Liquid: Not tested   ?Puree Puree: Impaired ?Presentation: Spoon ?Oral Phase Impairments: Poor awareness of bolus;Impaired mastication ?Oral Phase Functional Implications: Oral holding   ?Solid ? ? ?  Solid: Not tested  ? ?  ?Dimas Aguas, MA, CCC-SLP ?Acute Rehab SLP ?[phone] ? ?Fleet Contras ?03/20/2022,2:04 PM ? ? ? ?

## 2022-03-20 NOTE — Progress Notes (Signed)
LTM EEG placed,  no initial skin breakdown noted. [facility] monitoring, push button tested ?

## 2022-03-20 NOTE — Significant Event (Signed)
Rapid Response Event Note  ? ?Reason for Call :  ?Focal seizure ? ?Initial Focused Assessment:  ?RN asked me to assess this patient. He has rightward gaze and LUE twitching. Neuro is bedside and he worries that this patient will not be able to protect his airway. Patient is currently following basic commands. He has declined during my visit and is making sonorus respirations and is presumably aspirating.  ? ?CCM at bedside and they wish to intubate this patient. Patient transferred to 64M 08 without complication. EEG machine was transferred to patient's new room on 91m. ? ?BP 151/93 ?HR 96 ?RR 32 ?O2 100 (2L) ? ? ? ?Event Summary:  ? ?MD Notified: CCM and Neurology bedside ?Call Time: Was on unit  ?Arrival Time: 1500 ?End Time: [id number] ? ?Venetia Maxon, RN ?

## 2022-03-20 NOTE — Progress Notes (Signed)
OT Cancellation Note ? ?Patient Details ?Name: Frederick [name] ?MRN: [id number] ?DOB: 12/06/1961 ? ? ?Cancelled Treatment:    Reason Eval/Treat Not Completed: Patient declined, fatigued and requesting OT at a later time.  Continue efforts as appropriate.   ? ?Tylena Prisk D Onia Shiflett ?03/20/2022, 3:37 PM ?

## 2022-03-20 NOTE — Progress Notes (Signed)
Tracheal aspirate collected and sent to lab. 

## 2022-03-20 NOTE — Consult Note (Signed)
? ?NAME:  Frederick [name], MRN:  [id number], DOB:  Jul 04, 1961, LOS: 0 ?ADMISSION DATE:  03/19/2022, CONSULTATION DATE:  03/20/2022 ?REFERRING MD:  Dr. Lorrin Goodell, CHIEF COMPLAINT:  focal SE  ? ?History of Present Illness:  ? ?61 year old male with prior history of seizure, heavy alcohol abuse, asthma, and hypertension who presented on 4/14 with witnessed seizures.  Initially treated with versed with EMS which abated seizure.  On arrival to ER, patient had left hemiparesis therefore code stroke activated.  After CT, had more seizure activity requiring more ativan and keppra for cessation.   ? ?Prior hospitalization in February for generalized tonic-clonic status epilepticus requiring intubation secondary to medication non-compliance.  Hospitalization complicated by small right cerebellum stroke.   ? ?He was since admitted to [facility] with neurology following.  CIWA protocol active.  On 4/15, patient developed focal status on left thought to be progressing to [place] despite more AED now with concern for airway.  PCCM consulted for transfer to ICU for intubation and burst suppression.   ? ?Pertinent  Medical History  ?Tobacco abuse, seizure, heavy alcohol abuse, asthma, hypertension, PE (not on AC), pulmonary TB (treated), CVA ? ?Significant Hospital Events: ?Including procedures, antibiotic start and stop dates in addition to other pertinent events   ?4/14 admitted [place] r/o stroke, seizures ?4/15 PCCM consulted, focal status progressing-> intubation  ? ?Interim History / Subjective:  ?Arrived with neurology and RRT at bedside.   ? ?Objective   ?Blood pressure (!) 176/108, pulse 94, temperature 99.6 ?F (37.6 ?C), temperature source Axillary, resp. rate 18, height 6\' 1"  (1.854 m), weight 78.9 kg, SpO2 100 %. ?   ?   ? ?Intake/Output Summary (Last 24 hours) at 03/20/2022 1551 ?Last data filed at 03/20/2022 1519 ?Gross per 24 hour  ?Intake 1247.5 ml  ?Output --  ?Net 1247.5 ml  ? ?Filed Weights  ? 03/19/22 2000  ?Weight: 78.9 kg   ? ? ?Examination: ?General:  acute on chronically ill older male sitting upright in bed with audible gurgling ?HEENT: MM pink/moist, pupils 3/reactive, ? Right gaze intermittently, cEEG in place  ?Neuro:  Awake, will respond and follow intermittent commands, few words, MAE, continuous rhythmic movements noted on left side ?CV: rr NSR to ST ?PULM:  tachypneic, increased wob, good cough but unable to clear his secretions, diffuse bilateral rhonchi, O2 sats good 100% on 2L ?GI: soft, bs+, NT, condom cath  ?Extremities: warm/dry, no LE edema  ?Skin: no rashes  ? ?Resolved Hospital Problem list   ? ? ?Assessment & Plan:  ? ?Status epilepticus- focal felt to be progressing to generalized  ?Hx heavy ETOH abuse and medical noncompliance ?Hx seizures with prior SE ?- transfer to ICU now, intubate on arrival for airway insufficiency/ protection and burst suppression ?- Primary management per neurology  ?- Respiratory support as below/ above ?- cEEG per Neurology  ?- Maintain neuro protective measures; goal for eurothermia, euglycemia, eunatermia, normoxia, and PCO2 goal of 35-40 ?- Nutrition and bowel regiment - RD consulted, high risk for refeeding syndrome ?- Seizure precautions  ?- AEDs per neurology - keppra, vimpat, depakote  ?- ativan 4mg  after intubation f/b propofol gtt - no weaning till cleared by neurology ?- Aspirations precautions  ? ?Acute respiratory insufficiency related to above ?Presumed aspiration pneumonia ?- intubate on arrival to ICU ?- Continue MV support, 4-8cc/kg IBW with goal Pplat <30 and DP<15  ?- VAP prevention protocol/ PPI ?- PAD protocol for sedation> propofol/ fentanyl  ?- wean FiO2 as able for SpO2 >  92%  ?- no daily SAT & SBT/ weaning sedation till cleared by neurology  ?- send trach asp and start ceftriaxone  ? ?ETOH abuse ?- sedation as above ?- empiric thiamine/ folate/ MVI ? ?Hypoglycemia ?- finger glucose sticks have not been correlating with lab values ?- trend on BMET if suspected  ongoing hypoglycemia ?- adding TF which should help.  Wean D5W as able  ? ?HTN ?- hold htn meds for now for sedation (Coreg), stop IV scheduled lopressor ?- prn hydralazine  ? ?Hx of recent small right cerebellum stroke 01/2022 ?- per neurology, continue ASA/ pletal for now ?- serial neuro exams  ? ?Electrolyte derangements ?- Mag 1.4-> 2gm ordered, recheck in am ? ?Normocytic anemia ?- trend H/H  ? ?Best Practice (right click and "Reselect all SmartList Selections" daily)  ? ?Diet/type: tubefeeds ?DVT prophylaxis: LMWH ?GI prophylaxis: PPI ?Lines: N/A ?Foley:  N/A ?Code Status:  full code ?Last date of multidisciplinary goals of care discussion [ SO updated by Dr. Carlis Abbott.  Ask that we call as she has difficulty walking and will visit when he is awake] ? ?Labs   ?CBC: ?Recent Labs  ?Lab 03/19/22 ?2035 03/19/22 ?2304 03/20/22 ?[id number]  ?WBC 8.6  --  6.6  ?NEUTROABS 5.7  --   --   ?HGB 12.1* 14.3 10.1*  ?HCT 40.6 42.0 31.4*  ?MCV 88.8  --  85.3  ?PLT 295  --  204  ? ? ?Basic Metabolic Panel: ?Recent Labs  ?Lab 03/19/22 ?2035 03/19/22 ?2304 03/20/22 ?[id number] 03/20/22 ?1448  ?NA 135 134* 136 132*  ?K 4.3 4.3 3.0* 4.0  ?CL 102 101 111 102  ?CO2 20*  --  22 26  ?GLUCOSE 185* 176* 230* 103*  ?BUN 9 10 6* 5*  ?CREATININE 0.97 0.80 0.69 0.82  ?CALCIUM 9.2  --  6.9* 9.1  ? ?GFR: ?Estimated Creatinine Clearance: 105.6 mL/min (by C-G formula based on SCr of 0.82 mg/dL). ?Recent Labs  ?Lab 03/19/22 ?2035 03/20/22 ?[id number]  ?WBC 8.6 6.6  ? ? ?Liver Function Tests: ?Recent Labs  ?Lab 03/19/22 ?2035 03/20/22 ?[id number]  ?AST 18 12*  ?ALT 8 7  ?ALKPHOS 133* 94  ?BILITOT 0.3 0.3  ?PROT 7.9 5.5*  ?ALBUMIN 3.0* 2.1*  ? ?No results for input(s): LIPASE, AMYLASE in the last 168 hours. ?Recent Labs  ?Lab 03/20/22 ?[id number]  ?AMMONIA 21  ? ? ?ABG ?   ?Component Value Date/Time  ? PHART 7.448 01/05/2022 1811  ? PCO2ART 48.2 (H) 01/05/2022 1811  ? PO2ART 576 (H) 01/05/2022 1811  ? HCO3 30.0 (H) 01/11/2022 1311  ? TCO2 22 03/19/2022 2304  ? O2SAT 11.3 01/11/2022  1311  ?  ? ?Coagulation Profile: ?No results for input(s): INR, PROTIME in the last 168 hours. ? ?Cardiac Enzymes: ?No results for input(s): CKTOTAL, CKMB, CKMBINDEX, TROPONINI in the last 168 hours. ? ?HbA1C: ?Hgb A1c MFr Bld  ?Date/Time Value Ref Range Status  ?01/18/2022 09:48 AM 5.9 (H) 4.8 - 5.6 % Final  ?  Comment:  ?  (NOTE) ?Pre diabetes:          5.7%-6.4% ? ?Diabetes:              >6.4% ? ?Glycemic control for   <7.0% ?adults with diabetes ?  ?01/05/2022 10:02 PM 5.5 4.8 - 5.6 % Final  ?  Comment:  ?  (NOTE) ?Pre diabetes:          5.7%-6.4% ? ?Diabetes:              >  6.4% ? ?Glycemic control for   <7.0% ?adults with diabetes ?  ? ? ?CBG: ?Recent Labs  ?Lab 03/20/22 ?[id number] 03/20/22 ?0530 03/20/22 ?[id number] 03/20/22 ?0908 03/20/22 ?1038  ?GLUCAP 34* 30* 27* 69* 29*  ? ? ?Review of Systems:   ?Unable given neuro status  ? ?Past Medical History:  ?He,  has a past medical history of Arthritis, Asthma, Bilateral lower extremity edema (05/28/2021), Cough (05/28/2021), Dyspnea, Dyspnea (05/28/2021), EtOH dependence ([place]), Fibula fracture, Finger osteomyelitis, right ([place]) (05/26/2021), Hernia, Hypertension, Seizures ([place]), Streptococcal bacteremia (09/09/2021), and Tuberculosis.  ? ?Surgical History:  ? ?Past Surgical History:  ?Procedure Laterality Date  ? AMPUTATION Right 05/29/2021  ? Procedure: PARTIAL RIGHT 2ND FINGER AMPUTATION;  Surgeon: Mcarthur Rossetti, MD;  [facility];  Service: Orthopedics;  Laterality: Right;  ? ANKLE SURGERY    ? INGUINAL HERNIA REPAIR Right 01/19/2013  ? Procedure: HERNIA REPAIR INGUINAL ADULT;  Surgeon: Gayland Curry, MD,FACS;  Location: [facility];  Service: General;  Laterality: Right;  ? TEE WITHOUT CARDIOVERSION N/A 08/14/2021  ? Procedure: TRANSESOPHAGEAL ECHOCARDIOGRAM (TEE);  Surgeon: Josue Hector, MD;  Location: [facility];  Service: Cardiovascular;  Laterality: N/A;  ? TIBIA IM NAIL INSERTION Left 01/22/2013  ? Procedure: INTRAMEDULLARY (IM) NAIL  TIBIAL;  Surgeon: Johnny Bridge, MD;  Location: [facility];  Service: Orthopedics;  Laterality: Left;  ?  ? ?Social History:  ? reports that he has been smoking cigarettes. He has been smoking an average of .5 packs

## 2022-03-20 NOTE — Procedures (Signed)
Intubation Procedure Note ? ?ZAAHIR COBBETT  ?[id number]  ?30-Mar-1961 ? ?Date:03/20/22  ?Time:4:14 PM  ? ?Provider Performing:Marquesa Rath Naomie Dean  ? ? ?Procedure: Intubation ([id number]) ? ?Indication(s) ?Respiratory Failure ? ?Consent ?Unable to obtain consent due to emergent nature of procedure. ? ? ?Anesthesia ?Rocuronium and ketamine ? ? ?Time Out ?Verified patient identification, verified procedure, site/side was marked, verified correct patient position, special equipment/implants available, medications/allergies/relevant history reviewed, required imaging and test results available. ? ? ?Sterile Technique ?Usual hand hygeine, masks, and gloves were used ? ? ?Procedure Description ?Patient positioned in bed supine.  Sedation given as noted above.  Patient was intubated with endotracheal tube using Glidescope.  View was Grade 1 full glottis .  Number of attempts was 1.  Colorimetric CO2 detector was consistent with tracheal placement. ? ? ?Complications/Tolerance ?None; patient tolerated the procedure well. ?Chest X-ray is ordered to verify placement. ? ? ?EBL ?0 ? ? ?Specimen(s) ?None ? ?OGT placed following intubation. + bowel sounds over epigastrum. ? ?[name] Hy, DO 03/20/22 4:15 PM ?[facility] ? ?

## 2022-03-20 NOTE — Progress Notes (Signed)
Brief Nutrition Note ? ?Consult received for enteral/tube feeding initiation and management. ? ?Adult Enteral Nutrition Protocol initiated. Full assessment to follow. Pt currently intubated and being sedated with propofol. Propofol infusion rate increasing (currently at 23.68mL/h). Pt also receiving kcal from IVF (204kcal/d). ? ?Current regimen: ?Vital AF at 15mL/h with 2 packets of prosource tube feed provides 1040kcal and 106kcal/d ? ?Propofol + TF + IVF = 1869 kcal ? ?Admitting Dx: Focal seizure ([place]) [R56.9] ?Seizure ([place]) [R56.9] ?Hypoglycemia [E16.2] ? ?Body mass index is 22.95 kg/m?Marland Kitchen Pt meets criteria for normal BMI based on current weight. ? ?Labs: ?Recent Labs  ?Lab 03/19/22 ?2035 03/19/22 ?2304 03/20/22 ?[id number] 03/20/22 ?1448 03/20/22 ?1721  ?NA 135 134* 136 132* 135  ?K 4.3 4.3 3.0* 4.0 3.6  ?CL 102 101 111 102  --   ?CO2 20*  --  22 26  --   ?BUN 9 10 6* 5*  --   ?CREATININE 0.97 0.80 0.69 0.82  --   ?CALCIUM 9.2  --  6.9* 9.1  --   ?MG  --   --   --  1.4*  --   ?GLUCOSE 185* 176* 230* 103*  --   ? ? ?Ranell Patrick, RD, LDN ?Clinical Dietitian ?RD pager # available in [place]  ?After hours/weekend pager # available in [place]  ?

## 2022-03-20 NOTE — Progress Notes (Signed)
PT Cancellation Note ? ?Patient Details ?Name: EJ PINSON ?MRN: [id number] ?DOB: 07/06/1961 ? ? ?Cancelled Treatment:    Reason Eval/Treat Not Completed: Fatigue/lethargy limiting ability to participate ? ?Noted Speech Therapy swallow evaluation with pt alert but not following commands. Discussed with RN appropriateness of PT eval at this time and she stated pt has declined since that time and recommended holding off on evaluation today. Will continue to follow and proceed with evaluation when appropriate. ? ? ?Jerolyn Center, PT ?Acute Rehabilitation Services  ?Pager [phone] ?Office [phone] ? ?Scherrie November Imani Sherrin ?03/20/2022, 3:02 PM ?

## 2022-03-20 NOTE — Procedures (Signed)
Routine EEG Report [name] is a 61 y.o. male with a history of encephalopathy who is undergoing an EEG to evaluate for seizures. ? ?Report: This EEG was acquired with electrodes placed according to the International 10-20 electrode system (including Fp1, Fp2, F3, F4, C3, C4, P3, P4, O1, O2, T3, T4, T5, T6, A1, A2, Fz, Cz, Pz). The following electrodes were missing or displaced: none. ? ?The occipital dominant rhythm was 8.5 Hz. This activity is reactive to stimulation. Drowsiness was manifested by background fragmentation; deeper stages of sleep were identified by K complexes and sleep spindles. There was focal slowing over the right hemisphere that was rhythmic, sharply contoured, and organized, for most of the recording at approximately 2 Hz but at times briefly up to 2.5 Hz. There were no discrete electrographic seizures identified. There was no abnormal response to photic stimulation. Hyperventilation was not performed.  ? ?Impression: This EEG was obtained while awake and asleep and is abnormal due to: ?- right focal slowing that is rhythmic and sharply contoured, at 2 Hz and at times briefly up to 2.5 Hz. This is on the ictal-interictal continuum with high potential for evolution into focal status epilepticus. Recommend continuous EEG for further evaluation.  ? ?Bing Neighbors, MD ?[facility] ?[phone] ? ?If 7pm- 7am, please page neurology on call as listed in [facility].  ?

## 2022-03-21 DIAGNOSIS — J9601 Acute respiratory failure with hypoxia: Secondary | ICD-10-CM | POA: Diagnosis not present

## 2022-03-21 DIAGNOSIS — R569 Unspecified convulsions: Secondary | ICD-10-CM | POA: Diagnosis not present

## 2022-03-21 DIAGNOSIS — G40901 Epilepsy, unspecified, not intractable, with status epilepticus: Secondary | ICD-10-CM | POA: Diagnosis not present

## 2022-03-21 LAB — BASIC METABOLIC PANEL
Anion gap: 9 (ref 5–15)
Anion gap: 7 (ref 5–15)
Anion gap: 9 (ref 5–15)
Anion gap: 9 (ref 5–15)
BUN: 6 mg/dL — ABNORMAL LOW (ref 8–23)
BUN: 8 mg/dL (ref 8–23)
BUN: 9 mg/dL (ref 8–23)
BUN: 10 mg/dL (ref 8–23)
CO2: 23 mmol/L (ref 22–32)
CO2: 25 mmol/L (ref 22–32)
CO2: 24 mmol/L (ref 22–32)
CO2: 25 mmol/L (ref 22–32)
Calcium: 9.4 mg/dL (ref 8.9–10.3)
Calcium: 9.1 mg/dL (ref 8.9–10.3)
Calcium: 9 mg/dL (ref 8.9–10.3)
Calcium: 8.8 mg/dL — ABNORMAL LOW (ref 8.9–10.3)
Chloride: 101 mmol/L (ref 98–111)
Chloride: 103 mmol/L (ref 98–111)
Chloride: 102 mmol/L (ref 98–111)
Chloride: 102 mmol/L (ref 98–111)
Creatinine, Ser: 0.89 mg/dL (ref 0.61–1.24)
Creatinine, Ser: 0.88 mg/dL (ref 0.61–1.24)
Creatinine, Ser: 1.29 mg/dL — ABNORMAL HIGH (ref 0.61–1.24)
Creatinine, Ser: 1.28 mg/dL — ABNORMAL HIGH (ref 0.61–1.24)
GFR, Estimated: >60 mL/min (ref >60)
GFR, Estimated: >60 mL/min (ref >60)
GFR, Estimated: >60 mL/min (ref >60)
GFR, Estimated: >60 mL/min (ref >60)
Glucose, Bld: 121 mg/dL — ABNORMAL HIGH (ref 70–99)
Glucose, Bld: 127 mg/dL — ABNORMAL HIGH (ref 70–99)
Glucose, Bld: 100 mg/dL — ABNORMAL HIGH (ref 70–99)
Glucose, Bld: 107 mg/dL — ABNORMAL HIGH (ref 70–99)
Potassium: 3.1 mmol/L — ABNORMAL LOW (ref 3.5–5.1)
Potassium: 3.9 mmol/L (ref 3.5–5.1)
Potassium: 4.3 mmol/L (ref 3.5–5.1)
Potassium: 3.9 mmol/L (ref 3.5–5.1)
Sodium: 133 mmol/L — ABNORMAL LOW (ref 135–145)
Sodium: 135 mmol/L (ref 135–145)
Sodium: 135 mmol/L (ref 135–145)
Sodium: 136 mmol/L (ref 135–145)

## 2022-03-21 LAB — TRIGLYCERIDES: Triglycerides: 103 mg/dL (ref <150)

## 2022-03-21 LAB — CBC WITH DIFFERENTIAL/PLATELET
Abs Immature Granulocytes: 0.03 10*3/uL (ref 0.00–0.07)
Basophils Absolute: 0 10*3/uL (ref 0.0–0.1)
Basophils Relative: 1 %
Eosinophils Absolute: 0.4 10*3/uL (ref 0.0–0.5)
Eosinophils Relative: 5 %
HCT: 36.9 % — ABNORMAL LOW (ref 39.0–52.0)
Hemoglobin: 11.8 g/dL — ABNORMAL LOW (ref 13.0–17.0)
Immature Granulocytes: 0 %
Lymphocytes Relative: 23 %
Lymphs Abs: 1.9 10*3/uL (ref 0.7–4.0)
MCH: 26.6 pg (ref 26.0–34.0)
MCHC: 32 g/dL (ref 30.0–36.0)
MCV: 83.1 fL (ref 80.0–100.0)
Monocytes Absolute: 0.9 10*3/uL (ref 0.1–1.0)
Monocytes Relative: 12 %
Neutro Abs: 4.8 10*3/uL (ref 1.7–7.7)
Neutrophils Relative %: 59 %
Platelets: 281 10*3/uL (ref 150–400)
RBC: 4.44 MIL/uL (ref 4.22–5.81)
RDW: 16.1 % — ABNORMAL HIGH (ref 11.5–15.5)
WBC: 8 10*3/uL (ref 4.0–10.5)
nRBC: 0 % (ref 0.0–0.2)

## 2022-03-21 LAB — MAGNESIUM: Magnesium: 1.8 mg/dL (ref 1.7–2.4)

## 2022-03-21 LAB — GLUCOSE, CAPILLARY
Glucose-Capillary: 116 mg/dL — ABNORMAL HIGH (ref 70–99)
Glucose-Capillary: 125 mg/dL — ABNORMAL HIGH (ref 70–99)
Glucose-Capillary: 102 mg/dL — ABNORMAL HIGH (ref 70–99)
Glucose-Capillary: 80 mg/dL (ref 70–99)
Glucose-Capillary: 126 mg/dL — ABNORMAL HIGH (ref 70–99)

## 2022-03-21 LAB — IRON AND TIBC
Iron: 21 ug/dL — ABNORMAL LOW (ref 45–182)
Saturation Ratios: 8 % — ABNORMAL LOW (ref 17.9–39.5)
TIBC: 266 ug/dL (ref 250–450)
UIBC: 245 ug/dL

## 2022-03-21 LAB — RETICULOCYTES
Immature Retic Fract: 12.7 % (ref 2.3–15.9)
RBC.: 4.53 MIL/uL (ref 4.22–5.81)
Retic Count, Absolute: 52.1 10*3/uL (ref 19.0–186.0)
Retic Ct Pct: 1.2 % (ref 0.4–3.1)

## 2022-03-21 LAB — FERRITIN: Ferritin: 56 ng/mL (ref 24–336)

## 2022-03-21 LAB — PHOSPHORUS: Phosphorus: 2.6 mg/dL (ref 2.5–4.6)

## 2022-03-21 LAB — VITAMIN B12: Vitamin B-12: 366 pg/mL (ref 180–914)

## 2022-03-21 LAB — FOLATE: Folate: 19 ng/mL (ref >5.9)

## 2022-03-21 MED ORDER — DEXTROSE 5 % IV SOLN
500.0000 mg | Freq: Four times a day (QID) | INTRAVENOUS | Status: DC
Start: 2022-03-21 — End: 2022-03-26
  Administered 2022-03-21 – 2022-03-26 (×20): 500 mg via INTRAVENOUS
  Filled 2022-03-21 (×22): qty 5

## 2022-03-21 MED ORDER — PERAMPANEL 8 MG PO TABS
8.0000 mg | ORAL_TABLET | Freq: Once | ORAL | Status: AC
  Administered 2022-03-21: 8 mg
  Filled 2022-03-21: qty 1

## 2022-03-21 MED ORDER — MIDAZOLAM-SODIUM CHLORIDE 100-0.9 MG/100ML-% IV SOLN
10.0000 mg/h | INTRAVENOUS | Status: DC
  Administered 2022-03-21 – 2022-03-24 (×8): 10 mg/h via INTRAVENOUS
  Filled 2022-03-21 (×8): qty 100

## 2022-03-21 MED ORDER — VALPROATE SODIUM 100 MG/ML IV SOLN
2000.0000 mg | Freq: Once | INTRAVENOUS | Status: AC
  Administered 2022-03-21: 2000 mg via INTRAVENOUS
  Filled 2022-03-21: qty 20

## 2022-03-21 MED ORDER — DEXTROSE 5 % IV SOLN
1000.0000 mg | Freq: Once | INTRAVENOUS | Status: DC
Start: 2022-03-21 — End: 2022-03-21

## 2022-03-21 MED ORDER — NOREPINEPHRINE 4 MG/250ML-% IV SOLN
2.0000 ug/min | INTRAVENOUS | Status: DC
  Administered 2022-03-21 – 2022-03-22 (×2): 2 ug/min via INTRAVENOUS
  Filled 2022-03-21 (×2): qty 250

## 2022-03-21 MED ORDER — NOREPINEPHRINE 4 MG/250ML-% IV SOLN: 0.0000 ug/min | INTRAVENOUS | Status: DC

## 2022-03-21 MED ORDER — POTASSIUM CHLORIDE 10 MEQ/100ML IV SOLN
10.0000 meq | INTRAVENOUS | Status: AC
  Administered 2022-03-21 (×6): 10 meq via INTRAVENOUS
  Filled 2022-03-21 (×5): qty 100

## 2022-03-21 MED ORDER — MAGNESIUM SULFATE 2 GM/50ML IV SOLN
2.0000 g | Freq: Once | INTRAVENOUS | Status: AC
  Administered 2022-03-21: 2 g via INTRAVENOUS
  Filled 2022-03-21: qty 50

## 2022-03-21 MED ORDER — SODIUM CHLORIDE 0.9 % IV SOLN
250.0000 mL | INTRAVENOUS | Status: DC
  Administered 2022-03-21 – 2022-03-30 (×3): 250 mL via INTRAVENOUS

## 2022-03-21 MED ORDER — MIDAZOLAM BOLUS VIA INFUSION
10.0000 mg | Freq: Once | INTRAVENOUS | Status: AC
  Administered 2022-03-21: 10 mg via INTRAVENOUS
  Filled 2022-03-21: qty 10

## 2022-03-21 MED ORDER — LORAZEPAM 2 MG/ML IJ SOLN
2.0000 mg | INTRAMUSCULAR | Status: AC
  Administered 2022-03-21: 2 mg via INTRAVENOUS
  Filled 2022-03-21: qty 1

## 2022-03-21 NOTE — Progress Notes (Signed)
NEUROLOGY CONSULTATION PROGRESS NOTE  ? ?Date of service: March 21, 2022 ?Patient Name: Frederick [name] ?MRN:  [id number] ?DOB:  1961/06/23 ?  ?Interval Hx  ? ?Transferred to ICU in the setting of focal R hemispheric status with declining mentation and concerns for progression to the Left hemisphere. ? ?Propofol uptitrated to 78mcg/Kg/min, started on Versed 10mg /hr today. ? ?Vitals  ? ?Vitals:  ? 03/21/22 1030 03/21/22 1100 03/21/22 1115 03/21/22 1130  ?BP: 99/66 102/63  100/70  ?Pulse: (!) 101 (!) 106 (!) 109 (!) 106  ?Resp: 15 15 15 15   ?Temp:      ?TempSrc:      ?SpO2: 100% 100% 100% 100%  ?Weight:      ?Height:      ?  ? ?Body mass index is 22.95 kg/m?. ? ?Physical Exam  ? ?General: Laying comfortably in bed; in no acute distress. intubated ?HENT: Normal oropharynx and mucosa. Normal external appearance of ears and nose.  ?Neck: Supple, no pain or tenderness  ?CV: No JVD. No peripheral edema.  ?Pulmonary: Symmetric Chest rise. Normal respiratory effort.  ?Abdomen: Soft to touch, non-tender.  ?Ext: No cyanosis, edema, or deformity  ?Skin: No rash. Normal palpation of skin.   ?Musculoskeletal: Normal digits and nails by inspection. No clubbing.  ? ?Neurologic Examination  ?Mental status/Cognition: Intubated and sedated on propofol and Versed.  No response to loud voice or to clap.   ?Speech/language: Comatose mentation which precludes assessment of speech.  No attempts to communicate.   ?Cranial nerves/brainstem: ?Pupils: Pinpoint and reactive to light. ?Cough: Absent. ?Gag: Absent. ?Doll's eye: Absent. ?Corneals: Weakly positive bilaterally. ? ?Motor/sensory:  ?Muscle bulk: Poor, tone flaccid in all extremities. ?No response to proximal pain to noxious stimuli in any of the extremities. ? ?Coordination/Complex Motor:  ?Unable to assess. ? ?Labs  ? ?Basic Metabolic Panel:  ?Lab Results  ?Component Value Date  ? NA 135 03/21/2022  ? K 3.9 03/21/2022  ? CO2 25 03/21/2022  ? GLUCOSE 127 (H) 03/21/2022  ? BUN 8  03/21/2022  ? CREATININE 0.88 03/21/2022  ? CALCIUM 9.1 03/21/2022  ? GFRNONAA >60 03/21/2022  ? GFRAA >60 04/27/2020  ? ?HbA1c:  ?Lab Results  ?Component Value Date  ? HGBA1C 5.9 (H) 01/18/2022  ? ?LDL:  ?Lab Results  ?Component Value Date  ? LDLCALC 110 (H) 01/18/2022  ? ?Urine Drug Screen:  ?   ?Component Value Date/Time  ? LABOPIA NONE DETECTED 03/20/2022 1204  ? COCAINSCRNUR NONE DETECTED 03/20/2022 1204  ? LABBENZ POSITIVE (A) 03/20/2022 1204  ? AMPHETMU NONE DETECTED 03/20/2022 1204  ? THCU POSITIVE (A) 03/20/2022 1204  ? LABBARB NONE DETECTED 03/20/2022 1204  ?  ?Alcohol Level  ?   ?Component Value Date/Time  ? ETH <10 01/05/2022 1717  ? ?No results found for: PHENYTOIN, ZONISAMIDE, LAMOTRIGINE, LEVETIRACETA ?No results found for: PHENYTOIN, PHENOBARB, VALPROATE, CBMZ ? ?Imaging and Diagnostic studies  ?CT head without contrast: ?CTH was negative for a large hypodensity concerning for a large territory infarct or hyperdensity concerning for an ICH ? ?cEEG: ?This study initially showed non-convulsive/electrographic status epilepticus, arising from right hemisphere. As medications were adjusted, EEG improved but still showed highly epileptiform discharges in right hemisphere which is suggestive of high risk of seizure recurrence. Additionally there is profound diffuse encephalopathy, likely due to seizure, sedation.  ? ?Impression  ? ?Frederick [name] is a 61 year old male presenting with focal status with LUE twitching. Initially felt to be due to hypoglycemia. Episodes initially resolved after Ativan  and Keppra load but with recurrent LUE twitching. Initially treated conservatively with Vimpat, Depakote but his mentation worsened and cEEG demosntrates R hemispheric focal status that now seems to spreading over to the left hemisphere. He was intubated, given Ativan 4mg , started on propofol 27mcg/Kg/min and Versed 10mg /Hr and perampanel 8mg . ? ?Recommendations  ?- Current AEDs: ?- Keppra 1500mg  BID ?- Valproic  acid 500mg  Q6H ?- Vimpat 100mg  BID ?- Propofol 43mcg/kg/min ?- Versed 10mg /hr. ?- cEEG overnight ?- Further AEDs based on cEEG. ?- Perampanel 8mg  per tube once given on 03/21/22. ?Thiamine 100mg  PO or IV daily. ?- Valproic acid levels tomorrow AM. ? ?___________________________________________________________________ ? ? ?This patient is critically ill and at significant risk of neurological worsening, death and care requires constant monitoring of vital signs, hemodynamics,respiratory and cardiac monitoring, neurological assessment, discussion with family, other specialists and medical decision making of high complexity. I spent 40 minutes of neurocritical care time  in the care of  this patient. This was time spent independent of any time provided by nurse practitioner or PA. ? ? ?[facility] ?Pager Number ?03/21/2022  12:17 PM ? ?Plan discussed with Dr. ? ? ?Thank you for the opportunity to take part in the care of this patient. If you have any further questions, please contact the neurology consultation attending. ? ?Signed, ? ?91m ?[facility] ?Pager Number ? ?

## 2022-03-21 NOTE — Progress Notes (Signed)
Maint. Complete. Skin check complete. No break down. ?

## 2022-03-21 NOTE — Progress Notes (Signed)
eLink Physician-Brief Progress Note ?Patient Name: Frederick [name] ?DOB: 1961/01/22 ?MRN: [id number] ? ? ?Date of Service ? 03/21/2022  ?HPI/Events of Note ? Hypothermia, asking for bear hugger. ? ?Camera: ?In synchrony with vent. ?MAP 84, HR 62. 100% sats ?On propofol, K/mag replacement. ? ? ?Wbc normal. ?On ceftrioxone for asp pneumonia/AHRF. ? ?No active seizures.   ?eICU Interventions ? - bear hugger for now.  ? ? ? ?Intervention Category ?Intermediate Interventions: Other: (hypoterhmiac) ? ?Ranee Gosselin ?03/21/2022, 5:59 AM ?

## 2022-03-21 NOTE — Progress Notes (Signed)
An USGPIV (ultrasound guided PIV) has been placed for short-term vasopressor infusion. A correctly placed ivWatch must be used when administering Vasopressors. Should this treatment be needed beyond 72 hours, central line access should be obtained.  It will be the responsibility of the bedside nurse to follow best practice to prevent extravasations.  Patient has more 3 PIV access, but receiving medications are not comparable. Informed patient's RN to remove unnecessary PIV access when patient doesn't need not comparable medications. HS Truman Hayward RN ?

## 2022-03-21 NOTE — Progress Notes (Signed)
eLink Physician-Brief Progress Note ?Patient Name: Frederick [name] ?DOB: 1961/09/11 ?MRN: [id number] ? ? ?Date of Service ? 03/21/2022  ?HPI/Events of Note ? K, mag low, cr normal. Elink protocol ordered  ?eICU Interventions ?   ? ? ? ?Intervention Category ?Minor Interventions: Electrolytes abnormality - evaluation and management ? ?Ranee Gosselin ?03/21/2022, 4:53 AM ?

## 2022-03-21 NOTE — Progress Notes (Signed)
PT Cancellation Note ? ?Patient Details ?Name: Frederick [name] ?MRN: [id number] ?DOB: February 26, 1961 ? ? ?Cancelled Treatment:    Reason Eval/Treat Not Completed: Patient not medically ready.  Intubated, low arousal, on bear hugger, not ready today. ?03/21/2022 ? ?Jacinto Halim., PT ?Acute Rehabilitation Services ?[phone]  (pager) ?[phone]  (office) ? ? ?Eliseo Gum Herron Fero ?03/21/2022, 10:26 AM ?

## 2022-03-21 NOTE — Progress Notes (Signed)
? ?NAME:  Frederick [name], MRN:  [id number], DOB:  07-24-1961, LOS: 1 ?ADMISSION DATE:  03/19/2022, CONSULTATION DATE:  03/20/22 ?REFERRING MD:  Dr. Derry Lory CHIEF COMPLAINT:  Focal SE  ? ?History of Present Illness:  ?Patient is a 61 year old male with prior history of seizure, heavy alcohol abuse, asthma, and hypertension who presented on 4/14 with witnessed seizures.  Initially treated with versed with EMS which abated seizure.  On arrival to ER, patient had left hemiparesis therefore code stroke activated.  After CT, had more seizure activity requiring more ativan and keppra for cessation.   ?  ?Prior hospitalization in February for generalized tonic-clonic status epilepticus requiring intubation secondary to medication non-compliance.  Hospitalization complicated by small right cerebellum stroke.   ?  ?He was since admitted to [facility] with neurology following.  CIWA protocol active. On 4/15, patient developed focal status on left thought to be progressing to GTC despite more AED now with concern for airway.  PCCM consulted for transfer to ICU for intubation and burst suppression.   ?Pertinent  Medical History  ?Tobacco abuse, seizure, heavy alcohol abuse, asthma, hypertension, PE (not on AC), pulmonary TB (treated), CVA ?Significant Hospital Events: ?Including procedures, antibiotic start and stop dates in addition to other pertinent events   ?4/14 admitted [facility] r/o stroke, seizures ?4/15 PCCM consulted, focal status progressing-> intubation  ?Interim History / Subjective:  ?Overnight K and mag were low and were repleted. Rectal temp was 94 at 6 am and bair hugger was placed and temp this morning was 95. Intubated and sedated.  ?Review of Systems:   ?Negative unless stated in the subjective. ?Objective   ?Blood pressure (!) 87/67, pulse 63, temperature (!) 94.6 ?F (34.8 ?C), temperature source Rectal, resp. rate 15, height 6\' 1"  (1.854 m), weight 78.9 kg, SpO2 100 %. ?   ?Vent Mode: PRVC ?FiO2 (%):  [40 %] 40 % ?Set  Rate:  [15 bmp-20 bmp] 15 bmp ?Vt Set:  mL] 640 mL ?PEEP:  [5 cmH20] 5 cmH20 ?Plateau Pressure:  [18 cmH20] 18 cmH20  ? ?Intake/Output Summary (Last 24 hours) at 03/21/2022 0711 ?Last data filed at 03/21/2022 0600 ?Gross per 24 hour  ?Intake 1881.91 ml  ?Output 950 ml  ?Net 931.91 ml  ? ?Filed Weights  ? 03/19/22 2000  ?Weight: 78.9 kg  ? ?Examination: ?General: sedated and intubated. NCAT ?HENT: EEG placed, intubated ?Lungs: coarse lung sounds ?Cardiovascular: NSR, 2+ pulses ?Abdomen: soft, normal bowel sounds ?Extremities: no asymmetry ?Neuro: unable to assess as patient is intubated and sedated.  ?GU: foley catheter in place.  ?Consults  ? ?Resolved Hospital Problem list   ? ?Assessment & Plan:  ?Status epilepticus- focal felt to be progressing to generalized  ?Hx heavy ETOH abuse and medical noncompliance ?Hx seizures with prior SE ?Patient takes Keppra 1000 mg BID at home. Concern present for non-adherence to home regimen. Patient transferred to ICU and intubated on arrival for airway insufficiency/ protection and burst suppression, patient was given ativan 4mg  after intubation f/b propofol gtt. ?- Primary management per neurology, AEDs per neurology - keppra, vimpat, depakote, cEEG per Neurology  ?- Respiratory support as below/ above ?- Maintain neuro protective measures; goal for eurothermia, euglycemia, eunatermia, normoxia, and PCO2 goal of 35-40, will check his mag and K as it was repleted this am. ?- Nutrition and bowel regiment - RD consulted, high risk for refeeding syndrome ?- Seizure precautions  ?- no weaning till cleared by neurology ?- Aspirations precautions  ?  ?Acute respiratory  insufficiency related to above ?Presumed aspiration pneumonia ?Intubated on arrival to ICU. Continue to be intubated. No daily SAT & SBT/ weaning sedation till cleared by neurology . On ceftriaxone 2 g qd. Tracheal aspirate report pending ( prelim showed few WBC present, few gram positive cocci and few gram negative  rods) ?- Continue MV support, 4-8cc/kg IBW with goal Pplat <30 and DP<15  ?- VAP prevention protocol/ PPI ?- PAD protocol for sedation> propofol/ fentanyl  ?- wean FiO2 as able for SpO2 >92%  ?  ?ETOH abuse ?- sedation as above ?- empiric thiamine/ folate/ MVI ?  ?HTN ?Home meds include Coreg 25 mg BID. hold htn meds for now for sedation (Coreg),  ?- prn hydralazine  ?- CTM ?  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  ?- per neurology, continue ASA/ pletal for now ?- serial neuro exams  ?  ?Hypomagnesemia ?Hypokalemia ?- Mag 1.4-> 2gm ordered, recheck in am. K was 3.1 and repleted. Will repeat again.  ?  ?Normocytic anemia ?Around baseline. Trend H/H and CTM.  ?  ?Best Practice (right click and "Reselect all SmartList Selections" daily)  ?Diet/type: tubefeeds ?DVT prophylaxis: LMWH ?GI prophylaxis: PPI ?Lines: N/A ?Foley:  N/A ?Continuous: propofol 80 mcg ?Code Status:  full code ?Last date of multidisciplinary goals of care discussion []  ?Labs   ?CBC: ?Recent Labs  ?Lab 03/19/22 ?2035 03/19/22 ?2304 03/20/22 ?[id number] 03/20/22 ?1721 03/21/22 ?0304  ?WBC 8.6  --  6.6  --  8.0  ?NEUTROABS 5.7  --   --   --  4.8  ?HGB 12.1* 14.3 10.1* 12.6* 11.8*  ?HCT 40.6 42.0 31.4* 37.0* 36.9*  ?MCV 88.8  --  85.3  --  83.1  ?PLT 295  --  204  --  281  ? ?Basic Metabolic Panel: ?Recent Labs  ?Lab 03/19/22 ?2035 03/19/22 ?2304 03/20/22 ?[id number] 03/20/22 ?1448 03/20/22 ?1721 03/20/22 ?2201 03/21/22 ?0304  ?NA 135 134* 136 132* 135 135 133*  ?K 4.3 4.3 3.0* 4.0 3.6 4.5 3.1*  ?CL 102 101 111 102  --  103 101  ?CO2 20*  --  22 26  --  21* 23  ?GLUCOSE 185* 176* 230* 103*  --  109* 121*  ?BUN 9 10 6* 5*  --  6* 6*  ?CREATININE 0.97 0.80 0.69 0.82  --  0.88 0.89  ?CALCIUM 9.2  --  6.9* 9.1  --  9.2 9.4  ?MG  --   --   --  1.4*  --   --  1.8  ?PHOS  --   --   --   --   --  2.4* 2.6  ? ?GFR: ?Estimated Creatinine Clearance: 97.3 mL/min (by C-G formula based on SCr of 0.89 mg/dL). ?Recent Labs  ?Lab  03/19/22 ?2035 03/20/22 ?[id number] 03/21/22 ?0304  ?WBC 8.6 6.6 8.0  ? ?Liver Function Tests: ?Recent Labs  ?Lab 03/19/22 ?2035 03/20/22 ?[id number]  ?AST 18 12*  ?ALT 8 7  ?ALKPHOS 133* 94  ?BILITOT 0.3 0.3  ?PROT 7.9 5.5*  ?ALBUMIN 3.0* 2.1*  ? ?No results for input(s): LIPASE, AMYLASE in the last 168 hours. ?Recent Labs  ?Lab 03/20/22 ?[id number]  ?AMMONIA 21  ? ?ABG ?   ?Component Value Date/Time  ? PHART 7.502 (H) 03/20/2022 1721  ? PCO2ART 30.1 (L) 03/20/2022 1721  ? PO2ART 61 (L) 03/20/2022 1721  ? HCO3 23.7 03/20/2022 1721  ? TCO2 25 03/20/2022 1721  ? O2SAT 94 03/20/2022 1721  ?  ?  Coagulation Profile: ?No results for input(s): INR, PROTIME in the last 168 hours. ?Cardiac Enzymes: ?No results for input(s): CKTOTAL, CKMB, CKMBINDEX, TROPONINI in the last 168 hours. ?HbA1C: ?Hgb A1c MFr Bld  ?Date/Time Value Ref Range Status  ?01/18/2022 09:48 AM 5.9 (H) 4.8 - 5.6 % Final  ?  Comment:  ?  (NOTE) ?Pre diabetes:          5.7%-6.4% ? ?Diabetes:              >6.4% ? ?Glycemic control for   <7.0% ?adults with diabetes ?  ?01/05/2022 10:02 PM 5.5 4.8 - 5.6 % Final  ?  Comment:  ?  (NOTE) ?Pre diabetes:          5.7%-6.4% ? ?Diabetes:              >6.4% ? ?Glycemic control for   <7.0% ?adults with diabetes ?  ? ?CBG: ?Recent Labs  ?Lab 03/20/22 ?0908 03/20/22 ?1038 03/20/22 ?1914 03/20/22 ?2320 03/21/22 ?3557  ?GLUCAP 69* 29* 77 113* 116*  ? ?Past Medical History:  ?He,  has a past medical history of Arthritis, Asthma, Bilateral lower extremity edema (05/28/2021), Cough (05/28/2021), Dyspnea, Dyspnea (05/28/2021), EtOH dependence (HCC), Fibula fracture, Finger osteomyelitis, right (HCC) (05/26/2021), Hernia, Hypertension, Pulmonary embolism (HCC), Seizures (HCC), Streptococcal bacteremia (09/09/2021), and Tuberculosis.  ?Surgical History:  ? ?Past Surgical History:  ?Procedure Laterality Date  ? AMPUTATION Right 05/29/2021  ? Procedure: PARTIAL RIGHT 2ND FINGER AMPUTATION;  Surgeon: Kathryne Hitch, MD;  Location: [facility];  Service: Orthopedics;  Laterality: Right;  ? ANKLE SURGERY    ? INGUINAL HERNIA REPAIR Right 01/19/2013  ? Procedure: HERNIA REPAIR INGUINAL ADULT;  Surgeon: [name], MD,FACS;  Location

## 2022-03-21 NOTE — Progress Notes (Signed)
SLP Cancellation Note ? ?Patient Details ?Name: Frederick [name] ?MRN: [id number] ?DOB: [birth year] ? ? ?Cancelled treatment:       Reason Eval/Treat Not Completed: Patient not medically ready ?Pt remains intubated/sedated. SLP service to follow for eval as appropriate. ? ?Ajanee Buren P. Keeghan Mcintire, M.S., CCC-SLP ?Speech-Language Pathologist ?Acute Rehabilitation Services ?Pager: [phone] ? ?Seryna Marek P Sanaai Doane ?03/21/2022, 1:37 PM ?

## 2022-03-21 NOTE — Progress Notes (Signed)
OT Cancellation Note ? ?Patient Details ?Name: Frederick [name] ?MRN: [id number] ?DOB: 1961-06-08 ? ? ?Cancelled Treatment:    Reason Eval/Treat Not Completed: Patient not medically ready (Pt intubated, low arousal, on bair hugger.) Will continue to follow.  ? ?Malka So ?03/21/2022, 12:15 PM ?Nestor Lewandowsky, OTR/L ?Acute Rehabilitation Services ?Pager: [phone] ?Office: [phone]  ?

## 2022-03-21 NOTE — Procedures (Addendum)
Patient Name: Frederick [name]  ?MRN: [id number]  ?Epilepsy Attending: Charlsie Quest  ?Referring Physician/Provider: Erick Blinks, MD ?Duration: 03/20/2022 1242 to  03/21/2022 1242 ? ?Patient history: 61 y.o. male with a history of encephalopathy who is undergoing an EEG to evaluate for seizures. ? ?Level of alertness:  lethargic  ? ?AEDs during EEG study: LEV, LCM, VPA, propofol ? ?Technical aspects: This EEG study was done with scalp electrodes positioned according to the 10-20 International system of electrode placement. Electrical activity was acquired at a sampling rate of 500Hz  and reviewed with a high frequency filter of 70Hz  and a low frequency filter of 1Hz . EEG data were recorded continuously and digitally stored.  ? ?Description: EEG showed continuous generalized and lateralized 3-7hz  theta-delta slowing. At the beginning of the study, EEG also showed lateralized periodic discharges discharges in right hemisphere maximal F8/T8 at 2-2.5hz  which appeared rhythmic with evolution in morphology and frequency and brief 2-3 periodic of attenuation lasting 2-3 seconds. No clinical signs were seen. This eeg was consistent with  focal non-convulsive/electrographic status epilepticus. As medications were adjusted, after 1700 on 03/20/2022, EEG improved. EEG then showed rhythmic low amplitude 4-7hZ  theta slowing admixed with spikes in right hemisphere with evolution in morphology and frequency lasting average 10 seconds consistent with brief ictal-interictal rhythmic discharges ( BIRDS). After around 2300 on 03/20/2022, as propofol was adjusted, EEG showed bursts suppression pattern with eeg suppression lasting 3-15 seconds. However, the bursts consist of sharply contoured rhythmic discharges in right hemisphere, maximal F8/T8 admixed with polyspikes lasting 3-13 seconds consistent with highly epileptiform bursts. Hyperventilation and photic stimulation were not performed.    ? ?ABNORMALITY ?-  Non-convulsive/electrographic status epilepticus, right hemisphere  ?- Burst suppression with highly epileptiform burst, right hemisphere  ? ?IMPRESSION: ?This study initially showed non-convulsive/electrographic status epilepticus, arising from right hemisphere. As medications were adjusted, EEG improved but still showed highly epileptiform discharges in right hemisphere which is suggestive of high risk of seizure recurrence. Additionally there is profound diffuse encephalopathy, likely due to seizure, sedation.  ? ? ?[name] 03/22/2022  ? ?

## 2022-03-22 DIAGNOSIS — R569 Unspecified convulsions: Secondary | ICD-10-CM | POA: Diagnosis not present

## 2022-03-22 LAB — BASIC METABOLIC PANEL
Anion gap: 7 (ref 5–15)
Anion gap: 7 (ref 5–15)
Anion gap: 6 (ref 5–15)
BUN: 11 mg/dL (ref 8–23)
BUN: 9 mg/dL (ref 8–23)
BUN: 11 mg/dL (ref 8–23)
CO2: 26 mmol/L (ref 22–32)
CO2: 26 mmol/L (ref 22–32)
CO2: 27 mmol/L (ref 22–32)
Calcium: 9 mg/dL (ref 8.9–10.3)
Calcium: 9 mg/dL (ref 8.9–10.3)
Calcium: 9.2 mg/dL (ref 8.9–10.3)
Chloride: 105 mmol/L (ref 98–111)
Chloride: 108 mmol/L (ref 98–111)
Chloride: 110 mmol/L (ref 98–111)
Creatinine, Ser: 1.29 mg/dL — ABNORMAL HIGH (ref 0.61–1.24)
Creatinine, Ser: 1.19 mg/dL (ref 0.61–1.24)
Creatinine, Ser: 1.07 mg/dL (ref 0.61–1.24)
GFR, Estimated: >60 mL/min (ref >60)
GFR, Estimated: >60 mL/min (ref >60)
GFR, Estimated: >60 mL/min (ref >60)
Glucose, Bld: 104 mg/dL — ABNORMAL HIGH (ref 70–99)
Glucose, Bld: 109 mg/dL — ABNORMAL HIGH (ref 70–99)
Glucose, Bld: 109 mg/dL — ABNORMAL HIGH (ref 70–99)
Potassium: 3.8 mmol/L (ref 3.5–5.1)
Potassium: 3.8 mmol/L (ref 3.5–5.1)
Potassium: 3.9 mmol/L (ref 3.5–5.1)
Sodium: 138 mmol/L (ref 135–145)
Sodium: 141 mmol/L (ref 135–145)
Sodium: 143 mmol/L (ref 135–145)

## 2022-03-22 LAB — GLUCOSE, CAPILLARY
Glucose-Capillary: 114 mg/dL — ABNORMAL HIGH (ref 70–99)
Glucose-Capillary: 98 mg/dL (ref 70–99)
Glucose-Capillary: 84 mg/dL (ref 70–99)
Glucose-Capillary: 75 mg/dL (ref 70–99)
Glucose-Capillary: 120 mg/dL — ABNORMAL HIGH (ref 70–99)
Glucose-Capillary: 109 mg/dL — ABNORMAL HIGH (ref 70–99)

## 2022-03-22 LAB — CULTURE, RESPIRATORY W GRAM STAIN: Culture: NORMAL

## 2022-03-22 LAB — URINE CULTURE: Culture: >=100000 — AB

## 2022-03-22 LAB — PHOSPHORUS: Phosphorus: 3.3 mg/dL (ref 2.5–4.6)

## 2022-03-22 LAB — MAGNESIUM: Magnesium: 2 mg/dL (ref 1.7–2.4)

## 2022-03-22 MED ORDER — SODIUM CHLORIDE 0.9 % IV SOLN
150.0000 mg | Freq: Two times a day (BID) | INTRAVENOUS | Status: AC
  Administered 2022-03-22 – 2022-03-31 (×19): 150 mg via INTRAVENOUS
  Filled 2022-03-22 (×21): qty 15

## 2022-03-22 MED ORDER — CILOSTAZOL 100 MG PO TABS
100.0000 mg | ORAL_TABLET | Freq: Two times a day (BID) | ORAL | Status: DC
  Administered 2022-03-22 – 2022-04-10 (×38): 100 mg
  Filled 2022-03-22 (×41): qty 1

## 2022-03-22 MED ORDER — THIAMINE HCL 100 MG PO TABS
100.0000 mg | ORAL_TABLET | Freq: Every day | ORAL | Status: DC
  Administered 2022-03-23 – 2022-04-10 (×19): 100 mg
  Filled 2022-03-22 (×19): qty 1

## 2022-03-22 MED ORDER — PHENOBARBITAL SODIUM 130 MG/ML IJ SOLN
130.0000 mg | Freq: Once | INTRAMUSCULAR | Status: AC
  Administered 2022-03-22: 130 mg via INTRAVENOUS
  Filled 2022-03-22: qty 1

## 2022-03-22 MED ORDER — SODIUM CHLORIDE 0.9 % IV SOLN
3.0000 g | Freq: Four times a day (QID) | INTRAVENOUS | Status: DC
  Administered 2022-03-22 – 2022-03-24 (×8): 3 g via INTRAVENOUS
  Filled 2022-03-22 (×8): qty 8

## 2022-03-22 MED ORDER — PROSOURCE TF PO LIQD
45.0000 mL | Freq: Three times a day (TID) | ORAL | Status: DC
  Administered 2022-03-22 – 2022-03-25 (×9): 45 mL
  Filled 2022-03-22 (×9): qty 45

## 2022-03-22 MED ORDER — VITAL AF 1.2 CAL PO LIQD
1000.0000 mL | ORAL | Status: DC
Start: 2022-03-22 — End: 2022-03-25
  Administered 2022-03-22 – 2022-03-25 (×4): 1000 mL

## 2022-03-22 NOTE — Progress Notes (Signed)
OT Cancellation Note ? ?Patient Details ?Name: Frederick [name] ?MRN: [id number] ?DOB: February 09, 1961 ? ? ?Cancelled Treatment:    Reason Eval/Treat Not Completed: Patient not medically ready- pt sedated and intubated.  Not appropriate for OT at this time.  Will follow. ? ?Jolaine Artist, OT ?Acute Rehabilitation Services ?Pager [phone] ?Office [phone] ? ? ?Delight Stare ?03/22/2022, 11:19 AM ?

## 2022-03-22 NOTE — Procedures (Addendum)
Patient Name: [name]  ?MRN: [id number]  ?Epilepsy Attending: Charlsie Quest  ?Referring Physician/Provider: Erick Blinks, MD ?Duration: 03/21/2022 1242 to  03/22/2022 1242 ?  ?Patient history: 61 y.o. male with a history of encephalopathy who is undergoing an EEG to evaluate for seizures. ?  ?Level of alertness: Comatose ?  ?AEDs during EEG study: LEV, LCM, VPA, propofol, versed, perampanel ?  ?Technical aspects: This EEG study was done with scalp electrodes positioned according to the 10-20 International system of electrode placement. Electrical activity was acquired at a sampling rate of 500Hz  and reviewed with a high frequency filter of 70Hz  and a low frequency filter of 1Hz . EEG data were recorded continuously and digitally stored.  ?  ?Description: EEG showed bursts suppression pattern with eeg suppression lasting 10-12 seconds.  At the beginning of the study, the bursts consisted of sharply contoured rhythmic discharges in right hemisphere, maximal F8/T8 admixed with polyspikes with evolution in morphology and frequency, consistent with seizures without clinical signs, average 20 seizures per hour lasting about 10 to 12 seconds each.  Gradually as medications were adjusted, the duration of bursts improved and bursts lasted about 2 to 3 seconds but the morphology was still highly epileptiform.  Hyperventilation and photic stimulation were not performed.    ?  ?ABNORMALITY ?- Seizure without clinical signs, right hemisphere  ?- Burst suppression with highly epileptiform burst, right hemisphere  ?  ?IMPRESSION: ?This study initially showed seizures without clinical signs, arising from right hemisphere, average 20/hour lasting about 10 to 12 seconds each. As medications were adjusted, EEG improved but still showed highly epileptiform discharges in right hemisphere which is suggestive of high risk of seizure recurrence. Additionally there is profound diffuse encephalopathy, likely due to seizure,  sedation.  ?  ?  ?Frederick [name]  ?

## 2022-03-22 NOTE — Progress Notes (Addendum)
Initial Nutrition Assessment ? ?DOCUMENTATION CODES:  ? ?Non-severe (moderate) malnutrition in context of social or environmental circumstances ? ?INTERVENTION:  ? ?Tube feeding via OG tube: ?Change to Vital AF 1.2 at 50 ml/h (1200 ml per day). ?Prosource TF 45 ml TID. ? ?Provides 1560 kcal, 123 gm protein, 973 ml free water daily. ? ?Total intake with TF + propofol is 2560 kcal per day. ? ?NUTRITION DIAGNOSIS:  ? ?Moderate Malnutrition related to social / environmental circumstances (alcohol abuse) as evidenced by mild fat depletion, mild muscle depletion, moderate muscle depletion. ? ?GOAL:  ? ?Patient will meet greater than or equal to 90% of their needs ? ?MONITOR:  ? ?Vent status, TF tolerance, Labs ? ?REASON FOR ASSESSMENT:  ? ?Ventilator, Consult ?Enteral/tube feeding initiation and management ? ?ASSESSMENT:  ? ?61 yo male admitted with witnessed seizures. PMH includes seizure, heavy alcohol abuse, asthma, HTN. ? ?Discussed patient in ICU rounds and with RN today. ?Received MD Consult for TF initiation and management. ?OG tube in place. ?Currently receiving Vital High Protein at 40 ml/h with Prosource TF 45 ml BID to provide 1040 kcal, 106 gm protein, 803 ml free water daily.  ?RD to update TF orders to best meet nutrition needs.  ?Neurology following for ongoing seizures.  ?EEG showed burst suppression. ? ?Patient is currently intubated on ventilator support ?MV: 9.2 L/min ?Temp (24hrs), Avg:97.8 ?F (36.6 ?C), Min:97.2 ?F (36.2 ?C), Max:98.8 ?F (37.1 ?C) ? ?Propofol: 37.9 ml/hr providing 1000 kcal from lipid.  ? ?Labs reviewed. Phos 2.4 low on 4/15, up to 3.3 today. Magnesium 1.4 low on 4/15, up to 2.0 today. Potassium 3.1 low on 4/16, up to 3.8 today. ?CBG: [id number] ? ?Electrolyte abnormalities likely related to refeeding syndrome with hx of ETOH abuse and malnutrition. Phos, mag, and K have been replaced and are WNL today.  ? ?Medications reviewed and include folic acid, MVI with minerals, Protonix,  thiamine. ?  ?Weight history reviewed. Patient with ~4% weight loss within the past year, not significant for the time frame.  ? ?Patient meets criteria for moderate malnutrition with mild-moderate depletion of muscle and subcutaneous fat mass, likely related to hx of alcohol abuse.  ? ?NUTRITION - FOCUSED PHYSICAL EXAM: ? ?Flowsheet Row Most Recent Value  ?Orbital Region Mild depletion  ?Upper Arm Region Mild depletion  ?Thoracic and Lumbar Region Mild depletion  ?Buccal Region Unable to assess  ?Temple Region Mild depletion  ?Clavicle Bone Region Moderate depletion  ?Clavicle and Acromion Bone Region Moderate depletion  ?Scapular Bone Region Unable to assess  ?Dorsal Hand No depletion  ?Patellar Region Moderate depletion  ?Anterior Thigh Region Moderate depletion  ?Posterior Calf Region Moderate depletion  ?Edema (RD Assessment) None  ?Hair Unable to assess  ?Eyes Unable to assess  ?Mouth Unable to assess  ?Skin Reviewed  ?Nails Reviewed  ? ?  ? ? ?Diet Order:   ?Diet Order   ? ?       ?  Diet NPO time specified  Diet effective now       ?  ? ?  ?  ? ?  ? ? ?EDUCATION NEEDS:  ? ?No education needs have been identified at this time ? ?Skin:  Skin Assessment: Reviewed RN Assessment ? ?Last BM:  4/17 type 7 ? ?Height:  ? ?Ht Readings from Last 1 Encounters:  ?03/19/22 6\' 1"  (1.854 m)  ? ? ?Weight:  ? ?Wt Readings from Last 1 Encounters:  ?03/22/22 76.2 kg  ? ? ? ?BMI:  Body mass index is 22.16 kg/m?. ? ?Estimated Nutritional Needs:  ? ?Kcal:  2200-2400 kcal/d ? ?Protein:  110-125 g/d ? ?Fluid:  2.2-2.4 L/d ? ? ?Lucas Mallow RD, LDN, CNSC ?Please refer to Amion for contact information.                                                       ? ?

## 2022-03-22 NOTE — Progress Notes (Signed)
SLP Cancellation Note ? ?Patient Details ?Name: Frederick [name] ?MRN: [id number] ?DOB: 01-03-1961 ? ? ?Cancelled treatment:       Reason Eval/Treat Not Completed: Patient not medically ready. Will sign off at this time.  ? ? ?Gurjot Brisco, Katherene Ponto ?03/22/2022, 8:16 AM ?

## 2022-03-22 NOTE — Progress Notes (Signed)
Wasted 100 mg dose IVPB vimpat with Misty Stanley, Charity fundraiser.  ?

## 2022-03-22 NOTE — Progress Notes (Signed)
PT Cancellation Note ? ?Patient Details ?Name: Frederick [name] ?MRN: [id number] ?DOB: 1961-02-13 ? ? ?Cancelled Treatment:    Reason Eval/Treat Not Completed: Patient not medically ready ?Spoke with RN, pt remains intubated, very sedated, and unable to participate with therapy. Will f/u at later date. ?Anise Salvo, PT ?Acute Rehab Services ?Pager [phone] ?[facility] [phone] ? ? ?[name] ?03/22/2022, 11:30 AM ?

## 2022-03-22 NOTE — Progress Notes (Signed)
Subjective: Seizure frequency has improved but continues to have highly epileptiform discharges. ? ?ROS: Unable to obtain due to poor mental status ? ?Examination ? ?Vital signs in last 24 hours: ?Temp:  [97.2 ?F (36.2 ?C)-98.8 ?F (37.1 ?C)] 97.2 ?F (36.2 ?C) (04/17 1119) ?Pulse Rate:  [68-109] 71 (04/17 1133) ?Resp:  [14-19] 15 (04/17 1133) ?BP: (75-112)/(54-71) 98/60 (04/17 1000) ?SpO2:  [99 %-100 %] 100 % (04/17 1133) ?FiO2 (%):  [40 %] 40 % (04/17 1133) ?Weight:  [76.2 kg] 76.2 kg (04/17 0500) ? ?General: lying in bed, NAD ?RS: Intubated ?Neuro: On propofol and Versed, comatose, does not open eyes to noxious stimulation, pinpoint pupils difficult appreciate any reactivity, corneal reflex absent, gag reflex absent, does not withdraw to noxious stimuli in all 4 extremities ? ?Basic Metabolic Panel: ?Recent Labs  ?Lab 03/20/22 ?1448 03/20/22 ?1721 03/20/22 ?2201 03/21/22 ?0304 03/21/22 ?9509 03/21/22 ?1500 03/21/22 ?2117 03/22/22 ?0148 03/22/22 ?3267  ?NA 132*   < > 135 133* 135 135 136 138 141  ?K 4.0   < > 4.5 3.1* 3.9 4.3 3.9 3.8 3.8  ?CL 102  --  103 101 103 102 102 105 108  ?CO2 26  --  21* 23 25 24 25 26 26   ?GLUCOSE 103*  --  109* 121* 127* 100* 107* 104* 109*  ?BUN 5*  --  6* 6* 8 9 10 11 9   ?CREATININE 0.82  --  0.88 0.89 0.88 1.29* 1.28* 1.29* 1.19  ?CALCIUM 9.1  --  9.2 9.4 9.1 9.0 8.8* 9.0 9.0  ?MG 1.4*  --   --  1.8  --   --   --  2.0  --   ?PHOS  --   --  2.4* 2.6  --   --   --  3.3  --   ? < > = values in this interval not displayed.  ? ? ?CBC: ?Recent Labs  ?Lab 03/19/22 ?2035 03/19/22 ?2304 03/20/22 ?03/21/22 03/20/22 ?1721 03/21/22 ?0304  ?WBC 8.6  --  6.6  --  8.0  ?NEUTROABS 5.7  --   --   --  4.8  ?HGB 12.1* 14.3 10.1* 12.6* 11.8*  ?HCT 40.6 42.0 31.4* 37.0* 36.9*  ?MCV 88.8  --  85.3  --  83.1  ?PLT 295  --  204  --  281  ? ? ? ?Coagulation Studies: ?No results for input(s): LABPROT, INR in the last 72 hours. ? ?Imaging ?CT head without contrast 03/19/2022: No acute intracranial abnormality.  ASPECTS is 10. Age-related cerebral atrophy with chronic small vessel ischemic disease, with a few small remote infarcts about the right basal ganglia and right cerebellum. ? ?CTA head and neck with and without contrast 03/20/2022: ?1. Limited study due to contrast delay. ?2. No significant proximal vessel occlusion or significant stenosis ?in the neck. ?3. Limited visualization of the proximal common carotid arteries and ?vertebral arteries due to the contrast delay. ?4. Atherosclerotic changes at the carotid bifurcations bilaterally ?without significant stenosis. ?5. Dense calcifications within the cavernous internal carotid ?arteries bilaterally without significant stenosis. ?6. No significant proximal large vessel occlusion within the Circle ?of Willis. ? ?MRI brain without contrast 01/17/2022:  ?1. Small acute infarct in the right cerebellum. ?2. Cluster of subacute to chronic small infarcts in the right ?frontal parietal white matter. ?3. Cerebral volume loss and chronic small vessel ischemia. ? ?ASSESSMENT AND PLAN: 61 year old male who presented with focal convulsive status epilepticus (left upper extremity twitching) in the setting of hypoglycemia.  EEG  showed right hemispheric status epilepticus which has gradually improved. ? ? ?Focal convulsive status epilepticus, resolved ?Acute encephalopathy, due to seizure and medication ?Chronic strokes ?Alcohol use disorder ?-LTM EEG showed burst suppression pattern without epileptiform discharges predominantly right hemispheric region ? ?Recommendations ?-Continue propofol at 80 mcg/h, Versed at 10 mL/h.  Versed can be uptitrated if the duration of epileptiform bursts become longer ?-We will give IV phenobarbital 130 mg once ?-Increase Vimpat to 150 mg twice daily ?-Continue Keppra 1500 mg twice daily, Depakote 500 mg every 6 hours ?-Of note patient was given perampanel 8 mg once on 03/21/2022.  Will consider starting maintenance dose tomorrow depending on EEG  findings ?-If no seizures overnight, will consider weaning propofol tomorrow ?-Continue LTM EEG until patient is seizure-free for 24 hours ?-Continue seizure precautions ?-Management of rest of comorbidities per primary team ?-Discussed plan with Dr. Kendrick Fries ? ?CRITICAL CARE ?Performed by: Charlsie Quest ? ? ?Total critical care time: 40 minutes ? ?Critical care time was exclusive of separately billable procedures and treating other patients. ? ?Critical care was necessary to treat or prevent imminent or life-threatening deterioration. ? ?Critical care was time spent personally by me on the following activities: development of treatment plan with patient and/or surrogate as well as nursing, discussions with consultants, evaluation of patient's response to treatment, examination of patient, obtaining history from patient or surrogate, ordering and performing treatments and interventions, ordering and review of laboratory studies, ordering and review of radiographic studies, pulse oximetry and re-evaluation of patient's condition. ? ? ?Lindie Spruce ?Epilepsy ?[facility] ?For questions after 5pm please refer to AMION to reach the Neurologist on call ? ?

## 2022-03-22 NOTE — Progress Notes (Signed)
? ?NAME:  Frederick [name], MRN:  [id number], DOB:  19-Mar-1961, LOS: 2 ?ADMISSION DATE:  03/19/2022, CONSULTATION DATE:  03/20/22 ?REFERRING MD:  Dr. Derry Lory CHIEF COMPLAINT:  Focal SE  ? ?History of Present Illness:  ?Patient is a 61 year old male with prior history of seizure, heavy alcohol abuse, asthma, and hypertension who presented on 4/14 with witnessed seizures.  Initially treated with versed with EMS which abated seizure.  On arrival to ER, patient had left hemiparesis therefore code stroke activated.  After CT, had more seizure activity requiring more ativan and keppra for cessation.   ?  ?Prior hospitalization in February for generalized tonic-clonic status epilepticus requiring intubation secondary to medication non-compliance.  Hospitalization complicated by small right cerebellum stroke.   ?  ?He was since admitted to [facility] with neurology following.  CIWA protocol active. On 4/15, patient developed focal status on left thought to be progressing to GTC despite more AED now with concern for airway.  PCCM consulted for transfer to ICU for intubation and burst suppression.   ?Pertinent  Medical History  ?Tobacco abuse, seizure, heavy alcohol abuse, asthma, hypertension, PE (not on AC), pulmonary TB (treated), CVA ?Significant Hospital Events: ?Including procedures, antibiotic start and stop dates in addition to other pertinent events   ?4/14 admitted [facility] r/o stroke, seizures ?4/15 PCCM consulted, focal status progressing-> intubation  ?04/16 versed gtt started and Perampanel given once.  ?Interim History / Subjective:  ?Sedated and intubated. Tube feeds running.  ?Review of Systems:   ?Negative unless stated in the subjective. ?Objective   ?Blood pressure 112/70, pulse 68, temperature (!) 97.4 ?F (36.3 ?C), temperature source Oral, resp. rate 15, height 6\' 1"  (1.854 m), weight 76.2 kg, SpO2 100 %. ?   ?Vent Mode: PRVC ?FiO2 (%):  [40 %] 40 % ?Set Rate:  [15 bmp] 15 bmp ?Vt Set:  mL] 640 mL ?PEEP:  [5  cmH20] 5 cmH20 ?Plateau Pressure:  [16 cmH20-19 cmH20] 19 cmH20  ? ?Intake/Output Summary (Last 24 hours) at 03/22/2022 0709 ?Last data filed at 03/22/2022 0600 ?Gross per 24 hour  ?Intake 4028.37 ml  ?Output 1500 ml  ?Net 2528.37 ml  ? ? ?Filed Weights  ? 03/19/22 2000 03/22/22 0500  ?Weight: 78.9 kg 76.2 kg  ? ?Examination: ?General: NAD, intubated and sedated ?HENT:intubated, EEG in place ?Lungs: coarse ventilator lung sounds ?Cardiovascular: NSR, normal rate, 2+ pulses in all extremities, no LE edema ?Abdomen: soft abdomen, normal bowel sounds ?Extremities: no asymmetry ?Neuro: sedated and intubated, no seizure like activity visible ?GU: foley in place with discolored urine ?Consults  ?Neurology following ?Resolved Hospital Problem list   ? ?Assessment & Plan:  ?Status epilepticus- focal but progressing to generalized  ?Hx heavy ETOH abuse and medical noncompliance ?Hx seizures with prior SE ?Patient takes Keppra 1000 mg BID at home. Concern present for non-adherence to home regimen. Patient transferred to ICU and intubated on arrival for airway insufficiency/ protection and burst suppression, patient was given ativan 4mg  after intubation f/b propofol gtt. ?- Primary management per neurology, AEDs per neurology - keppra, vimpat, depakote, cEEG per Neurology. Versed gtt started yesterday.  Perampanel given once yesterday. No weaning till cleared by neurology ?-Valproic acid ordered, will fu results ?- Respiratory support as below/ above ?- Maintain neuro protective measures; goal for eurothermia, euglycemia, eunatermia, normoxia, and PCO2 goal of 35-40, will check his mag and K as it was repleted this am. ?- Nutrition and bowel regiment - RD following, high risk for refeeding syndrome ?- Seizure  precautions  ?- Aspirations precautions  ?  ?Acute respiratory failure related to above ?Presumed aspiration pneumonia ?Urinary Tract Infection ?Intubated on arrival to ICU. Continue to be intubated. No daily SAT & SBT/  weaning sedation till cleared by neurology . On ceftriaxone 2 g qd. With urine culture growth of enterococcus faecalis, will transition to unasyn. Tracheal aspirate report pending ( prelim showed few WBC present, few gram positive cocci and few gram negative rods) ?- Continue MV support, 4-8cc/kg IBW with goal Pplat <30 and DP<15  ?- VAP prevention protocol/ PPI ?- PAD protocol for sedation> propofol/ fentanyl  ?- wean FiO2 as able for SpO2 >92%  ?  ?ETOH abuse ?- sedation as above ?- empiric thiamine/ folate/ MVI ?  ?HTN ?Home meds include Coreg 25 mg BID. hold htn meds for now due to hypotension.  ?- prn hydralazine  ?- CTM ?  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  ?- per neurology, continue ASA/ pletal for now ?- serial neuro exams  ?  ?Hypomagnesemia ?Hypokalemia ?- Mag 1.4-> 2gm ordered, recheck in am. K was 3.1 and repleted. K 3.8 and 2.0 this am.  ?  ?Normocytic anemia ?Around baseline. Trend H/H and CTM.  ?  ?Best Practice (right click and "Reselect all SmartList Selections" daily)  ?Diet/type: tubefeeds ?DVT prophylaxis: LMWH ?GI prophylaxis: PPI ?Lines: N/A ?Foley:  N/A ?Continuous: propofol 80 mcg, 50 IVF, 10 mg versed, 2 mcg levo ?Code Status:  full code ?Last date of multidisciplinary goals of care discussion []  ?Labs   ?BMP stable from yesterday. Mag and phos wnl.  ? ?CBC: ?Recent Labs  ?Lab 03/19/22 ?2035 03/19/22 ?2304 03/20/22 ?[id number] 03/20/22 ?1721 03/21/22 ?0304  ?WBC 8.6  --  6.6  --  8.0  ?NEUTROABS 5.7  --   --   --  4.8  ?HGB 12.1* 14.3 10.1* 12.6* 11.8*  ?HCT 40.6 42.0 31.4* 37.0* 36.9*  ?MCV 88.8  --  85.3  --  83.1  ?PLT 295  --  204  --  281  ? ? ?Basic Metabolic Panel: ?Recent Labs  ?Lab 03/20/22 ?1448 03/20/22 ?1721 03/20/22 ?2201 03/21/22 ?0304 03/21/22 ?[id number] 03/21/22 ?1500 03/21/22 ?2117 03/22/22 ?0148  ?NA 132*   < > 135 133* 135 135 136 138  ?K 4.0   < > 4.5 3.1* 3.9 4.3 3.9 3.8  ?CL 102  --  103 101 103 102 102 105  ?CO2 26  --  21* 23 25  24 25 26   ?GLUCOSE 103*  --  109* 121* 127* 100* 107* 104*  ?BUN 5*  --  6* 6* 8 9 10 11   ?CREATININE 0.82  --  0.88 0.89 0.88 1.29* 1.28* 1.29*  ?CALCIUM 9.1  --  9.2 9.4 9.1 9.0 8.8* 9.0  ?MG 1.4*  --   --  1.8  --   --   --  2.0  ?PHOS  --   --  2.4* 2.6  --   --   --  3.3  ? < > = values in this interval not displayed.  ? ? ?GFR: ?Estimated Creatinine Clearance: 64.8 mL/min (A) (by C-G formula based on SCr of 1.29 mg/dL (H)). ?Recent Labs  ?Lab 03/19/22 ?2035 03/20/22 ?[id number] 03/21/22 ?0304  ?WBC 8.6 6.6 8.0  ? ? ?Liver Function Tests: ?Recent Labs  ?Lab 03/19/22 ?2035 03/20/22 ?[id number]  ?AST 18 12*  ?ALT 8 7  ?ALKPHOS 133* 94  ?BILITOT 0.3 0.3  ?PROT 7.9  5.5*  ?ALBUMIN 3.0* 2.1*  ? ? ?No results for input(s): LIPASE, AMYLASE in the last 168 hours. ?Recent Labs  ?Lab 03/20/22 ?9983  ?AMMONIA 21  ? ? ?ABG ?   ?Component Value Date/Time  ? PHART 7.502 (H) 03/20/2022 1721  ? PCO2ART 30.1 (L) 03/20/2022 1721  ? PO2ART 61 (L) 03/20/2022 1721  ? HCO3 23.7 03/20/2022 1721  ? TCO2 25 03/20/2022 1721  ? O2SAT 94 03/20/2022 1721  ? ?  ?Coagulation Profile: ?No results for input(s): INR, PROTIME in the last 168 hours. ?Cardiac Enzymes: ?No results for input(s): CKTOTAL, CKMB, CKMBINDEX, TROPONINI in the last 168 hours. ?HbA1C: ?Hgb A1c MFr Bld  ?Date/Time Value Ref Range Status  ?01/18/2022 09:48 AM 5.9 (H) 4.8 - 5.6 % Final  ?  Comment:  ?  (NOTE) ?Pre diabetes:          5.7%-6.4% ? ?Diabetes:              >6.4% ? ?Glycemic control for   <7.0% ?adults with diabetes ?  ?01/05/2022 10:02 PM 5.5 4.8 - 5.6 % Final  ?  Comment:  ?  (NOTE) ?Pre diabetes:          5.7%-6.4% ? ?Diabetes:              >6.4% ? ?Glycemic control for   <7.0% ?adults with diabetes ?  ? ?CBG: ?Recent Labs  ?Lab 03/21/22 ?0751 03/21/22 ?1535 03/21/22 ?1919 03/21/22 ?2307 03/22/22 ?3825  ?GLUCAP 125* 102* 80 126* 114*  ? ? ?Past Medical History:  ?He,  has a past medical history of Arthritis, Asthma, Bilateral lower extremity edema (05/28/2021), Cough  (05/28/2021), Dyspnea, Dyspnea (05/28/2021), EtOH dependence (HCC), Fibula fracture, Finger osteomyelitis, right (HCC) (05/26/2021), Hernia, Hypertension, Pulmonary embolism (HCC), Seizures (HCC), Streptococcal bac

## 2022-03-22 NOTE — Progress Notes (Signed)
Attending: ?  ? ?Subjective: ?61 y/o male brought to ICU on 4/15 in the setting of significant seizure activity, required intubation for airway protection.   ?Remains mechanically ventilated ?Neurology managing his AEDs ?Remains on levophed ?Started on versed drip yesterday  ? ?Objective: ?Vitals:  ? 03/22/22 0700 03/22/22 0727 03/22/22 0736 03/22/22 0800  ?BP: 112/70  97/62 103/63  ?Pulse: 68  70 69  ?Resp: 15  15 15   ?Temp:  (!) 97.5 ?F (36.4 ?C)    ?TempSrc:  Oral    ?SpO2: 100%  100% 100%  ?Weight:      ?Height:      ? ?Vent Mode: PRVC ?FiO2 (%):  [40 %] 40 % ?Set Rate:  [15 bmp] 15 bmp ?Vt Set:  mL] 640 mL ?PEEP:  [5 cmH20] 5 cmH20 ?Plateau Pressure:  [15 cmH20-19 cmH20] 15 cmH20 ? ?Intake/Output Summary (Last 24 hours) at 03/22/2022 03/24/2022 ?Last data filed at 03/22/2022 0600 ?Gross per 24 hour  ?Intake 3714.3 ml  ?Output 1500 ml  ?Net 2214.3 ml  ? ? ?General:  In bed on vent ?HENT: NCAT ETT in place ?PULM: CTA B, vent supported breathing ?CV: RRR, no mgr ?GI: BS+, soft, nontender ?MSK: normal bulk and tone ?Neuro: sedated on vent ? ? ? ?CBC ?   ?Component Value Date/Time  ? WBC 8.0 03/21/2022 0304  ? RBC 4.53 03/21/2022 0304  ? RBC 4.44 03/21/2022 0304  ? HGB 11.8 (L) 03/21/2022 0304  ? HCT 36.9 (L) 03/21/2022 0304  ? PLT 281 03/21/2022 0304  ? MCV 83.1 03/21/2022 0304  ? MCH 26.6 03/21/2022 0304  ? MCHC 32.0 03/21/2022 0304  ? RDW 16.1 (H) 03/21/2022 0304  ? LYMPHSABS 1.9 03/21/2022 0304  ? MONOABS 0.9 03/21/2022 0304  ? EOSABS 0.4 03/21/2022 0304  ? BASOSABS 0.0 03/21/2022 0304  ? ? ?BMET ?   ?Component Value Date/Time  ? NA 138 03/22/2022 0148  ? K 3.8 03/22/2022 0148  ? CL 105 03/22/2022 0148  ? CO2 26 03/22/2022 0148  ? GLUCOSE 104 (H) 03/22/2022 0148  ? BUN 11 03/22/2022 0148  ? CREATININE 1.29 (H) 03/22/2022 0148  ? CREATININE 0.74 10/27/2021 0000  ? CALCIUM 9.0 03/22/2022 0148  ? GFRNONAA >60 03/22/2022 0148  ? GFRAA >60 04/27/2020 0009  ? ? ?CXR images reviewed, scarring RUL,  ET ? ?Impression/Plan: ?Status epilepticus due to non-compliance and EtOH abuse > neurology following/managing AEDs.  Remains on versed and propofol infusions for burst suppression, remains on keppra, vimpat, depakote.  F/u neuro recommendations ?Acute respiratory failure with hypoxemia due to inability to protect airway> Full mechanical vent support ?VAP prevention ?Daily WUA/SBT ?Aspiration pneumonia> resp culture pending, d/c ceftriaxone, start unasyn to cover UTI due to e. Faecalis, 5 days ?UTI> as above ?Shock due to sedation> wean off levophed for MAP > 65 ?EtOH abuse/dependence> continue thiamine, folate ?History of stroke> will need PT after, continue statin, restart pletal ?History of TB, scarring RUL, scattered nodules > would be best to f/u in pulmonary clinic as outpatient ? ?My cc time 31 minutes ? ?04/29/2020, MD ?[facility] ?Pager: [phone] ?Cell: [phone] ?After 7pm: [phone] ? ?

## 2022-03-22 NOTE — TOC Progression Note (Signed)
Transition of Care (TOC) - Progression Note  ? ? ?Patient Details  ?Name: Frederick [name] ?MRN: [id number] ?Date of Birth: 1961-09-07 ? ?Transition of Care (TOC) CM/SW Contact  ?Beckie Busing, RN ?Phone [phone] ? ?03/22/2022, 1:14 PM ? ?Clinical Narrative:    ?TOC acknowledged consult for substance abuse screen and high risk for readmission. Patient currently intubated and unable to participate in assessment at this time.  ?TOC will continue to monitor patient advancement through interdisciplinary progression rounds. ? ?  ?  ? ?Expected Discharge Plan and Services ?  ?  ?  ?  ?  ?                ?  ?  ?  ?  ?  ?  ?  ?  ?  ?  ? ? ?Social Determinants of Health (SDOH) Interventions ?  ? ?Readmission Risk Interventions ? ?  01/12/2022  ?  3:25 PM  ?Readmission Risk Prevention Plan  ?Transportation Screening Complete  ?Medication Review Oceanographer) Referral to Pharmacy  ?PCP or Specialist appointment within 3-5 days of discharge Not Complete  ?PCP/Specialist Appt Not Complete comments Patient not ready for d/c  ?HRI or Home Care Consult Not Complete  ?SW Recovery Care/Counseling Consult Complete  ?Palliative Care Screening Not Applicable  ?Skilled Nursing Facility Complete  ? ? ?

## 2022-03-23 DIAGNOSIS — R569 Unspecified convulsions: Secondary | ICD-10-CM | POA: Diagnosis not present

## 2022-03-23 LAB — GLUCOSE, CAPILLARY
Glucose-Capillary: 94 mg/dL (ref 70–99)
Glucose-Capillary: 101 mg/dL — ABNORMAL HIGH (ref 70–99)
Glucose-Capillary: 114 mg/dL — ABNORMAL HIGH (ref 70–99)
Glucose-Capillary: 118 mg/dL — ABNORMAL HIGH (ref 70–99)
Glucose-Capillary: 115 mg/dL — ABNORMAL HIGH (ref 70–99)
Glucose-Capillary: 120 mg/dL — ABNORMAL HIGH (ref 70–99)

## 2022-03-23 LAB — PHOSPHORUS: Phosphorus: 3.7 mg/dL (ref 2.5–4.6)

## 2022-03-23 LAB — CBC
HCT: 34.2 % — ABNORMAL LOW (ref 39.0–52.0)
Hemoglobin: 11 g/dL — ABNORMAL LOW (ref 13.0–17.0)
MCH: 27 pg (ref 26.0–34.0)
MCHC: 32.2 g/dL (ref 30.0–36.0)
MCV: 84 fL (ref 80.0–100.0)
Platelets: 258 10*3/uL (ref 150–400)
RBC: 4.07 MIL/uL — ABNORMAL LOW (ref 4.22–5.81)
RDW: 16.5 % — ABNORMAL HIGH (ref 11.5–15.5)
WBC: 7.8 10*3/uL (ref 4.0–10.5)
nRBC: 0 % (ref 0.0–0.2)

## 2022-03-23 LAB — BASIC METABOLIC PANEL
Anion gap: 8 (ref 5–15)
BUN: 13 mg/dL (ref 8–23)
CO2: 23 mmol/L (ref 22–32)
Calcium: 9.2 mg/dL (ref 8.9–10.3)
Chloride: 111 mmol/L (ref 98–111)
Creatinine, Ser: 1.02 mg/dL (ref 0.61–1.24)
GFR, Estimated: >60 mL/min (ref >60)
Glucose, Bld: 115 mg/dL — ABNORMAL HIGH (ref 70–99)
Potassium: 3.7 mmol/L (ref 3.5–5.1)
Sodium: 142 mmol/L (ref 135–145)

## 2022-03-23 LAB — MAGNESIUM: Magnesium: 1.6 mg/dL — ABNORMAL LOW (ref 1.7–2.4)

## 2022-03-23 LAB — VALPROIC ACID LEVEL: Valproic Acid Lvl: 63 ug/mL (ref 50.0–100.0)

## 2022-03-23 MED ORDER — PHENOBARBITAL SODIUM 130 MG/ML IJ SOLN
130.0000 mg | Freq: Every day | INTRAMUSCULAR | Status: DC
  Administered 2022-03-23 – 2022-03-25 (×3): 130 mg via INTRAVENOUS
  Filled 2022-03-23 (×3): qty 1

## 2022-03-23 MED ORDER — POTASSIUM CHLORIDE 10 MEQ/100ML IV SOLN
10.0000 meq | INTRAVENOUS | Status: AC
  Administered 2022-03-23 (×3): 10 meq via INTRAVENOUS
  Filled 2022-03-23 (×3): qty 100

## 2022-03-23 MED ORDER — MAGNESIUM SULFATE 4 GM/100ML IV SOLN
4.0000 g | Freq: Once | INTRAVENOUS | Status: AC
  Administered 2022-03-23: 4 g via INTRAVENOUS
  Filled 2022-03-23: qty 100

## 2022-03-23 NOTE — Progress Notes (Signed)
LTM maint complete - no skin breakdown under:  FP1 FP2 F8 ?

## 2022-03-23 NOTE — Progress Notes (Signed)
OT Cancellation Note ? ?Patient Details ?Name: Frederick [name] ?MRN: [id number] ?DOB: 1961-03-01 ? ? ?Cancelled Treatment:    Reason Eval/Treat Not Completed: Patient not medically ready- pt remains intubated and sedated.  Spoke to RN and reports pt remains not appropriate for OT at this time. OT will sign off.  Please re-consult when medically ready.   ? ?Barry Brunner, OT ?Acute Rehabilitation Services ?Pager [phone] ?Office [phone] ? ? ?Frederick [name] ?03/23/2022, 8:25 AM ?

## 2022-03-23 NOTE — Procedures (Addendum)
Patient Name: Frederick [name]  ?MRN: [id number]  ?Epilepsy Attending: Charlsie Quest  ?Referring Physician/Provider: Erick Blinks, MD ?Duration: 03/22/2022 1242 to  03/23/2022 1242 ?  ?Patient history: 62 y.o. male with a history of encephalopathy who is undergoing an EEG to evaluate for seizures. ?  ?Level of alertness: Comatose ?  ?AEDs during EEG study: LEV, LCM, VPA, propofol, versed,  Phenobarb ?  ?Technical aspects: This EEG study was done with scalp electrodes positioned according to the 10-20 International system of electrode placement. Electrical activity was acquired at a sampling rate of 500Hz  and reviewed with a high frequency filter of 70Hz  and a low frequency filter of 1Hz . EEG data were recorded continuously and digitally stored.  ?  ?Description: EEG showed bursts suppression pattern with bursts of sharply contoured 3 to 6 Hz theta-delta slowing admixed with polyspikes in right hemisphere lasting 2 to 3 seconds alternating with generalized suppression lasting 2 to 5 seconds.  Hyperventilation and photic stimulation were not performed.    ?  ?ABNORMALITY ?- Burst suppression, generalized ?- Polyspikes, right hemisphere ?  ?IMPRESSION: ?This study showed evidence of epileptogenicity arising from right hemisphere as well as profound diffuse encephalopathy, likely due to seizure, sedation.  No definite seizures were seen during the study. ?  ?  ?Frederick [name]  ?

## 2022-03-23 NOTE — Progress Notes (Signed)
An USGPIV (ultrasound guided PIV) has been placed for short-term vasopressor infusion. A correctly placed ivWatch must be used when administering Vasopressors. Should this treatment be needed beyond 72 hours, central line access should be obtained.  It will be the responsibility of the bedside nurse to follow best practice to prevent extravasations.   ?

## 2022-03-23 NOTE — Progress Notes (Signed)
Attending: ?  ? ?Subjective: ?Remains mechanically ventilated ?On tube feeding ?EEG showed no definite seizures this morning, some epileptogenicity in right hemisphere ? ?Past Medical History:  ?Diagnosis Date  ? Arthritis   ? Asthma   ? Bilateral lower extremity edema 05/28/2021  ? propping feet up  ? Cough 05/28/2021  ? with clear sputum  ? Dyspnea   ? Dyspnea 05/28/2021  ? with exertion  ? EtOH dependence (HCC)   ? drinks 2-3 of 40ounes a day  ? Fibula fracture   ? Dr Dion Saucier 02-2011  ? Finger osteomyelitis, right (HCC) 05/26/2021  ? right index finger  ? Hernia   ? surgery 01-17-13  ? Hypertension   ? not taking bp meds since may 2022  ? Pulmonary embolism (HCC)   ? Seizures (HCC)   ? Streptococcal bacteremia 09/09/2021  ? Tuberculosis   ? couple of yrs ago took tx at [facility] per pt on 05-28-2021  ? ? ? ?Objective: ?Vitals:  ? 03/23/22 0700 03/23/22 0730 03/23/22 0800 03/23/22 0831  ?BP: (!) 107/58 (!) 104/59 (!) 103/57 (!) 106/59  ?Pulse: 71 71 71 70  ?Resp: 15 15 15 15   ?Temp:  (!) 96.4 ?F (35.8 ?C)    ?TempSrc:  Axillary    ?SpO2: 100% 100% 100% 100%  ?Weight:      ?Height:      ? ?Vent Mode: PRVC ?FiO2 (%):  [40 %] 40 % ?Set Rate:  [15 bmp] 15 bmp ?Vt Set:  mL] 640 mL ?PEEP:  [5 cmH20] 5 cmH20 ?Plateau Pressure:  [16 cmH20-17 cmH20] 17 cmH20 ? ?Intake/Output Summary (Last 24 hours) at 03/23/2022 0914 ?Last data filed at 03/23/2022 0800 ?Gross per 24 hour  ?Intake 3507.57 ml  ?Output 2850 ml  ?Net 657.57 ml  ? ? ?General:  In bed on vent ?HENT: NCAT ETT in place ?PULM: CTA B, vent supported breathing ?CV: RRR, no mgr ?GI: BS+, soft, nontender ?MSK: normal bulk and tone ?Neuro: sedated on vent ? ? ?CBC ?   ?Component Value Date/Time  ? WBC 7.8 03/23/2022 0242  ? RBC 4.07 (L) 03/23/2022 0242  ? HGB 11.0 (L) 03/23/2022 0242  ? HCT 34.2 (L) 03/23/2022 0242  ? PLT 258 03/23/2022 0242  ? MCV 84.0 03/23/2022 0242  ? MCH 27.0 03/23/2022 0242  ? MCHC 32.2 03/23/2022 0242  ? RDW 16.5 (H)  03/23/2022 0242  ? LYMPHSABS 1.9 03/21/2022 0304  ? MONOABS 0.9 03/21/2022 0304  ? EOSABS 0.4 03/21/2022 0304  ? BASOSABS 0.0 03/21/2022 0304  ? ? ?BMET ?   ?Component Value Date/Time  ? NA 142 03/23/2022 0242  ? K 3.7 03/23/2022 0242  ? CL 111 03/23/2022 0242  ? CO2 23 03/23/2022 0242  ? GLUCOSE 115 (H) 03/23/2022 0242  ? BUN 13 03/23/2022 0242  ? CREATININE 1.02 03/23/2022 0242  ? CREATININE 0.74 10/27/2021 0000  ? CALCIUM 9.2 03/23/2022 0242  ? GFRNONAA >60 03/23/2022 0242  ? GFRAA >60 04/27/2020 0009  ? ? ? ? ?Impression/Plan: ?Acute respiratory failure with hypoxemia due to inability to protect airway > continue full vent support until able to wean off versed infusion, VAP prevention ?Pneumonia/uti> unasyn ?EtOH abuse> thiamine, folate ?Status epilepticus> hopefully can wean off versed infusion, continue AED per neuro ? ?My cc time 30 minutes ? ?04/29/2020, MD ?[facility] ?Pager: [phone] ?Cell: [phone] ?After 7pm: [phone] ? ?

## 2022-03-23 NOTE — Progress Notes (Signed)
? ?NAME:  Frederick [name], MRN:  [id number], DOB:  [birth year], LOS: 3 ?ADMISSION DATE:  03/19/2022, CONSULTATION DATE:  03/20/22 ?REFERRING MD:  Dr. Lorrin Goodell CHIEF COMPLAINT:  Focal SE  ? ?History of Present Illness:  ?Patient is a 61 year old male with prior history of seizure, heavy alcohol abuse, asthma, and hypertension who presented on 4/14 with witnessed seizures.  Initially treated with versed with EMS which abated seizure.  On arrival to ER, patient had left hemiparesis therefore code stroke activated.  After CT, had more seizure activity requiring more ativan and keppra for cessation.   ?  ?Prior hospitalization in February for generalized tonic-clonic status epilepticus requiring intubation secondary to medication non-compliance.  Hospitalization complicated by small right cerebellum stroke.   ?  ?He was since admitted to [facility] with neurology following.  CIWA protocol active. On 4/15, patient developed focal status on left thought to be progressing to [place] despite more AED now with concern for airway.  PCCM consulted for transfer to ICU for intubation and pressure support. ?Pertinent  Medical History  ?Tobacco abuse, seizure, heavy alcohol abuse, asthma, hypertension, PE (not on AC), pulmonary TB (treated), CVA ?Significant Hospital Events: ?Including procedures, antibiotic start and stop dates in addition to other pertinent events   ?4/14 admitted [facility] r/o stroke, seizures ?4/15 PCCM consulted, focal status progressing-> intubation  ?04/16 versed gtt started and Perampanel given once.  ?04/17 phenobarbital 130 mg given once ?04/18 planned sedation wean by neurology ?Interim History / Subjective:  ?Sedated and intubated. Tube feeds running.  ?Review of Systems:   ?Negative unless stated in the subjective. ?Objective   ?Blood pressure (!) 107/58, pulse 71, temperature (!) 97.5 ?F (36.4 ?C), temperature source Axillary, resp. rate 15, height 6\' 1"  (1.854 m), weight 76.5 kg, SpO2 100 %. ?   ?Vent Mode: PRVC ?FiO2  (%):  [40 %] 40 % ?Set Rate:  [15 bmp] 15 bmp ?Vt Set:  [id number] mL] 640 mL ?PEEP:  [5 cmH20] 5 cmH20 ?Plateau Pressure:  [15 cmH20-17 cmH20] 16 cmH20  ? ?Intake/Output Summary (Last 24 hours) at 03/23/2022 0728 ?Last data filed at 03/23/2022 0700 ?Gross per 24 hour  ?Intake 3692.56 ml  ?Output 2550 ml  ?Net 1142.56 ml  ? ? ?Filed Weights  ? 03/19/22 2000 03/22/22 0500 03/23/22 0500  ?Weight: 78.9 kg 76.2 kg 76.5 kg  ? ?Examination: ?General: NAD, sedated and intubated, no seizure like activity noted. ?HENT:intubated, EEG in place, NCAT ?Lungs: CTAB ?Cardiovascular: NSR, 2+ pulses, no LE edema  ?Abdomen: soft, BS present ?Extremities: no asymmetry ?Neuro: sedated, no response to voice or sternal rub ?GU: catheter in place, light yellow urine.  ?Consults  ?Neurology following ?Resolved Hospital Problem list   ? ?Assessment & Plan:  ?Status epilepticus- focal but progressing to generalized  ?Hx heavy ETOH abuse and medical noncompliance ?Hx seizures with prior SE ?Patient takes Keppra 1000 mg BID at home. Concern present for non-adherence to home regimen and this was verified with significant other. In the ICU for airway and pressure support along with optimization of physiologic parameters to prevent seizures. Primary management per neurology, AEDs per neurology - keppra, vimpat, depakote, cEEG per Neurology. Versed gtt started yesterday.  Perampanel given once 04/16. No weaning till cleared by neurology. Phenobarb given yesterday and with no reported seizure, neurology will wean sedation slowly.  ?- Respiratory support as below/ above ?- Maintain neuro protective measures; goal for eurothermia, euglycemia, eunatermia, normoxia, and PCO2 goal of 35-40, will check his mag and K as it  was repleted this am. ?- Nutrition and bowel regiment - RD following, high risk for refeeding syndrome ?- Seizure precautions  ?- Aspirations precautions  ?  ?Acute respiratory failure related to above ?Presumed aspiration pneumonia ?Urinary  Tract Infection ?Intubated on arrival to ICU. Continue to be intubated. No daily SAT & SBT/ weaning sedation till cleared by neurology . On Unasyn due to aspiration pneumonia and UTI. Tracheal aspirate report pending ( prelim showed few WBC present, few gram positive cocci and few gram negative rods) ?- Continue MV support, 4-8cc/kg IBW with goal Pplat <30 and DP<15  ?- VAP prevention protocol/ PPI ?- PAD protocol for sedation> propofol/ fentanyl  ?- wean FiO2 as able for SpO2 >92%  ?  ?ETOH abuse ?- sedation as above ?- empiric thiamine/ folate/ MVI ?  ?HTN ?Home meds include Coreg 25 mg BID. hold htn meds for now due to hypotension.  ?- prn hydralazine  ?- CTM ?  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  ?- per neurology, continue ASA/ pletal for now ?- serial neuro exams  ?  ?Hypomagnesemia ?Hypokalemia ?- Mag 1.6 this am> 2gm ordered, recheck tmrw in am. K was 3.7 and repleted.  ?-Follow mag and K tomorrow.  ? ?Normocytic anemia ?Around baseline. Trend H/H and CTM. Will benefit from oral iron replacement once more stable given low iron and Tsat level but will defer due to acute infection. Hgb above goal for CKD at 11. ?  ?Best Practice (right click and "Reselect all SmartList Selections" daily)  ?Diet/type: tubefeeds ?DVT prophylaxis: LMWH ?GI prophylaxis: PPI ?Lines: N/A ?Foley:  N/A ?Continuous: propofol 80 mcg, 10 mg versed, 2 mcg levo ?Code Status:  full code ?Last date of multidisciplinary goals of care discussion []  ?Labs   ?BMP stable from yesterday. Mag and phos wnl.  ? ?CBC: ?Recent Labs  ?Lab 03/19/22 ?2035 03/19/22 ?2304 03/20/22 ?[id number] 03/20/22 ?1721 03/21/22 ?0304  ?WBC 8.6  --  6.6  --  8.0  ?NEUTROABS 5.7  --   --   --  4.8  ?HGB 12.1* 14.3 10.1* 12.6* 11.8*  ?HCT 40.6 42.0 31.4* 37.0* 36.9*  ?MCV 88.8  --  85.3  --  83.1  ?PLT 295  --  204  --  281  ? ? ?Basic Metabolic Panel: ?Recent Labs  ?Lab 03/20/22 ?1448 03/20/22 ?1721 03/20/22 ?2201 03/21/22 ?0304  03/21/22 ?[id number] 03/21/22 ?2117 03/22/22 ?0148 03/22/22 ?[id number] 03/22/22 ?1610 03/23/22 ?0242  ?NA 132*   < > 135 133*   < > 136 138 141 143 142  ?K 4.0   < > 4.5 3.1*   < > 3.9 3.8 3.8 3.9 3.7  ?CL 102  --  103 101   < > 102 105 108 110 111  ?CO2 26  --  21* 23   < > 25 26 26 27 23   ?GLUCOSE 103*  --  109* 121*   < > 107* 104* 109* 109* 115*  ?BUN 5*  --  6* 6*   < > 10 11 9 11 13   ?CREATININE 0.82  --  0.88 0.89   < > 1.28* 1.29* 1.19 1.07 1.02  ?CALCIUM 9.1  --  9.2 9.4   < > 8.8* 9.0 9.0 9.2 9.2  ?MG 1.4*  --   --  1.8  --   --  2.0  --   --  1.6*  ?PHOS  --   --  2.4* 2.6  --   --  3.3  --   --  3.7  ? < > = values in this interval not displayed.  ? ? ?GFR: ?Estimated Creatinine Clearance: 82.3 mL/min (by C-G formula based on SCr of 1.02 mg/dL). ?Recent Labs  ?Lab 03/19/22 ?2035 03/20/22 ?[id number] 03/21/22 ?0304  ?WBC 8.6 6.6 8.0  ? ? ?Liver Function Tests: ?Recent Labs  ?Lab 03/19/22 ?2035 03/20/22 ?[id number]  ?AST 18 12*  ?ALT 8 7  ?ALKPHOS 133* 94  ?BILITOT 0.3 0.3  ?PROT 7.9 5.5*  ?ALBUMIN 3.0* 2.1*  ? ? ?No results for input(s): LIPASE, AMYLASE in the last 168 hours. ?Recent Labs  ?Lab 03/20/22 ?[id number]  ?AMMONIA 21  ? ? ?ABG ?   ?Component Value Date/Time  ? PHART 7.502 (H) 03/20/2022 1721  ? PCO2ART 30.1 (L) 03/20/2022 1721  ? PO2ART 61 (L) 03/20/2022 1721  ? HCO3 23.7 03/20/2022 1721  ? TCO2 25 03/20/2022 1721  ? O2SAT 94 03/20/2022 1721  ? ?  ?Coagulation Profile: ?No results for input(s): INR, PROTIME in the last 168 hours. ?Cardiac Enzymes: ?No results for input(s): CKTOTAL, CKMB, CKMBINDEX, TROPONINI in the last 168 hours. ?HbA1C: ?Hgb A1c MFr Bld  ?Date/Time Value Ref Range Status  ?01/18/2022 09:48 AM 5.9 (H) 4.8 - 5.6 % Final  ?  Comment:  ?  (NOTE) ?Pre diabetes:          5.7%-6.4% ? ?Diabetes:              >6.4% ? ?Glycemic control for   <7.0% ?adults with diabetes ?  ?01/05/2022 10:02 PM 5.5 4.8 - 5.6 % Final  ?  Comment:  ?  (NOTE) ?Pre diabetes:          5.7%-6.4% ? ?Diabetes:              >6.4% ? ?Glycemic  control for   <7.0% ?adults with diabetes ?  ? ?CBG: ?Recent Labs  ?Lab 03/22/22 ?1113 03/22/22 ?1533 03/22/22 ?1914 03/22/22 ?2321 03/23/22 ?[id number]  ?GLUCAP 84 75 120* 109* 94  ? ? ?Past Medical History:

## 2022-03-23 NOTE — Progress Notes (Addendum)
Subjective: No seizures overnight ? ?ROS: Unable to obtain due to poor mental status ? ?Examination ? ?Vital signs in last 24 hours: ?Temp:  [94.4 ?F (34.7 ?C)-97.5 ?F (36.4 ?C)] 96.4 ?F (35.8 ?C) (04/18 0730) ?Pulse Rate:  [62-90] 83 (04/18 1118) ?Resp:  [13-24] 15 (04/18 1118) ?BP: (85-165)/(48-73) 109/59 (04/18 1100) ?SpO2:  [100 %] 100 % (04/18 1118) ?FiO2 (%):  [40 %] 40 % (04/18 1118) ?Weight:  [76.5 kg] 76.5 kg (04/18 0500) ? ?  ?General: lying in bed, NAD ?RS: Intubated ?Neuro: On propofol @ 60 and Versed@10 , comatose, does not open eyes to noxious stimulation, pinpoint pupils difficult appreciate any reactivity, corneal reflex absent, gag reflex absent, does not withdraw to noxious stimuli in all 4 extremities ? ?Basic Metabolic Panel: ?Recent Labs  ?Lab 03/20/22 ?1448 03/20/22 ?1721 03/20/22 ?2201 03/21/22 ?0304 03/21/22 ?[id number] 03/21/22 ?2117 03/22/22 ?0148 03/22/22 ?[id number] 03/22/22 ?1610 03/23/22 ?0242  ?NA 132*   < > 135 133*   < > 136 138 141 143 142  ?K 4.0   < > 4.5 3.1*   < > 3.9 3.8 3.8 3.9 3.7  ?CL 102  --  103 101   < > 102 105 108 110 111  ?CO2 26  --  21* 23   < > 25 26 26 27 23   ?GLUCOSE 103*  --  109* 121*   < > 107* 104* 109* 109* 115*  ?BUN 5*  --  6* 6*   < > 10 11 9 11 13   ?CREATININE 0.82  --  0.88 0.89   < > 1.28* 1.29* 1.19 1.07 1.02  ?CALCIUM 9.1  --  9.2 9.4   < > 8.8* 9.0 9.0 9.2 9.2  ?MG 1.4*  --   --  1.8  --   --  2.0  --   --  1.6*  ?PHOS  --   --  2.4* 2.6  --   --  3.3  --   --  3.7  ? < > = values in this interval not displayed.  ? ? ?CBC: ?Recent Labs  ?Lab 03/19/22 ?2035 03/19/22 ?2304 03/20/22 ?[id number] 03/20/22 ?1721 03/21/22 ?0304 03/23/22 ?0242  ?WBC 8.6  --  6.6  --  8.0 7.8  ?NEUTROABS 5.7  --   --   --  4.8  --   ?HGB 12.1* 14.3 10.1* 12.6* 11.8* 11.0*  ?HCT 40.6 42.0 31.4* 37.0* 36.9* 34.2*  ?MCV 88.8  --  85.3  --  83.1 84.0  ?PLT 295  --  204  --  281 258  ? ? ? ?Coagulation Studies: ?No results for input(s): LABPROT, INR in the last 72 hours. ? ?Imaging ?No new brain  imaging overnight. ? ? ?ASSESSMENT AND PLAN: 61 year old male who presented with focal convulsive status epilepticus (left upper extremity twitching) in the setting of hypoglycemia.  EEG showed right hemispheric status epilepticus which has gradually improved. ?   ?Focal convulsive status epilepticus, resolved ?Acute encephalopathy, due to seizure and medication ?Chronic strokes ?Alcohol use disorder ?-LTM EEG showed evidence of epileptogenicity arising from right hemisphere as well as profound diffuse encephalopathy, likely due to seizure, sedation.  No definite seizures were seen during the study. ?  ?Recommendations ?- Will wean propofol at 10 mcg/h to 50 mcg/h.  If no seizure recurrence, will continue to wean down further ?-Continue Versed 52ml/hour, will likely stop tomorrow if no seizure recurrence ?-Start IV phenobarbital 130 mg daily ?-Continue Vimpat 150 mg twice daily, Keppra 1500 mg  twice daily, Depakote 500 mg every 6 hours ?-Continue LTM EEG until patient is seizure-free for 24 hours ?-Continue seizure precautions ?-Management of rest of comorbidities per primary team ?-Discussed plan with Dr. Lake Bells ? ?ADDENDUM ?- NO sz, will wean propofol further down to 102mcg/hour ?  ?CRITICAL CARE ?Performed by: Lora Havens ?  ?  ?Total critical care time: 32 minutes ?  ?Critical care time was exclusive of separately billable procedures and treating other patients. ?  ?Critical care was necessary to treat or prevent imminent or life-threatening deterioration. ?  ?Critical care was time spent personally by me on the following activities: development of treatment plan with patient and/or surrogate as well as nursing, discussions with consultants, evaluation of patient's response to treatment, examination of patient, obtaining history from patient or surrogate, ordering and performing treatments and interventions, ordering and review of laboratory studies, ordering and review of radiographic studies, pulse  oximetry and re-evaluation of patient's condition. ?  ? ? ? ?Zeb Comfort ?Epilepsy ?[facility] ?For questions after 5pm please refer to AMION to reach the Neurologist on call ? ?

## 2022-03-23 NOTE — Progress Notes (Signed)
PT Cancellation Note ? ?Patient Details ?Name: Frederick [name] ?MRN: [id number] ?DOB: May 23, 1961 ? ? ?Cancelled Treatment:    Reason Eval/Treat Not Completed: Patient not medically ready (Patient not medically ready- pt remains intubated and sedated.  Spoke to RN and reports pt remains not appropriate for PT at this time. PT will sign off.) ? ? ?Frederick [name] ?03/23/2022, 8:42 AM ?Alvis Lemmings M,PT ?Acute Rehab Services ?[phone] ?[phone] (pager)  ?

## 2022-03-23 NOTE — Plan of Care (Signed)
  Problem: Clinical Measurements: Goal: Will remain free from infection Outcome: Progressing Goal: Diagnostic test results will improve Outcome: Progressing Goal: Respiratory complications will improve Outcome: Progressing Goal: Cardiovascular complication will be avoided Outcome: Progressing   Problem: Nutrition: Goal: Adequate nutrition will be maintained Outcome: Progressing   Problem: Elimination: Goal: Will not experience complications related to bowel motility Outcome: Progressing Goal: Will not experience complications related to urinary retention Outcome: Progressing   Problem: Pain Managment: Goal: General experience of comfort will improve Outcome: Progressing   

## 2022-03-23 NOTE — Progress Notes (Signed)
[facility] ADULT ICU REPLACEMENT PROTOCOL ? ? ?The patient does apply for the [facility] Adult ICU Electrolyte Replacment Protocol based on the criteria listed below:  ? ?1.Exclusion criteria: TCTS patients, ECMO patients, and Dialysis patients ?2. Is GFR >/= 30 ml/min? Yes.    ?Patient's GFR today is >60 ?3. Is SCr </= 2? Yes.   ?Patient's SCr is 1.02 mg/dL ?4. Did SCr increase >/= 0.5 in 24 hours? No. ?5.Pt's weight >40kg  Yes.   ?6. Abnormal electrolyte(s): Mag 1.6  ?7. Electrolytes replaced per protocol ?8.  Call MD STAT for K+ </= 2.5, Phos </= 1, or Mag </= 1 ?Physician:  Dr.Sommer ? ?Lolita Lenz 03/23/2022 5:17 AM  ?

## 2022-03-24 ENCOUNTER — Inpatient Hospital Stay (HOSPITAL_COMMUNITY): Payer: Medicare Other

## 2022-03-24 DIAGNOSIS — R569 Unspecified convulsions: Secondary | ICD-10-CM | POA: Diagnosis not present

## 2022-03-24 LAB — GLUCOSE, CAPILLARY
Glucose-Capillary: 34 mg/dL — CL (ref 70–99)
Glucose-Capillary: 120 mg/dL — ABNORMAL HIGH (ref 70–99)
Glucose-Capillary: 117 mg/dL — ABNORMAL HIGH (ref 70–99)
Glucose-Capillary: 102 mg/dL — ABNORMAL HIGH (ref 70–99)
Glucose-Capillary: 116 mg/dL — ABNORMAL HIGH (ref 70–99)
Glucose-Capillary: 109 mg/dL — ABNORMAL HIGH (ref 70–99)

## 2022-03-24 LAB — CBC
HCT: 32.6 % — ABNORMAL LOW (ref 39.0–52.0)
Hemoglobin: 10.6 g/dL — ABNORMAL LOW (ref 13.0–17.0)
MCH: 26.6 pg (ref 26.0–34.0)
MCHC: 32.5 g/dL (ref 30.0–36.0)
MCV: 81.9 fL (ref 80.0–100.0)
Platelets: 200 10*3/uL (ref 150–400)
RBC: 3.98 MIL/uL — ABNORMAL LOW (ref 4.22–5.81)
RDW: 16.5 % — ABNORMAL HIGH (ref 11.5–15.5)
WBC: 7.6 10*3/uL (ref 4.0–10.5)
nRBC: 0 % (ref 0.0–0.2)

## 2022-03-24 LAB — BASIC METABOLIC PANEL
Anion gap: 6 (ref 5–15)
BUN: 14 mg/dL (ref 8–23)
CO2: 24 mmol/L (ref 22–32)
Calcium: 8.7 mg/dL — ABNORMAL LOW (ref 8.9–10.3)
Chloride: 111 mmol/L (ref 98–111)
Creatinine, Ser: 0.9 mg/dL (ref 0.61–1.24)
GFR, Estimated: >60 mL/min (ref >60)
Glucose, Bld: 120 mg/dL — ABNORMAL HIGH (ref 70–99)
Potassium: 4.2 mmol/L (ref 3.5–5.1)
Sodium: 141 mmol/L (ref 135–145)

## 2022-03-24 LAB — TRIGLYCERIDES: Triglycerides: 28 mg/dL (ref <150)

## 2022-03-24 LAB — PHOSPHORUS: Phosphorus: 4.5 mg/dL (ref 2.5–4.6)

## 2022-03-24 LAB — MAGNESIUM: Magnesium: 1.6 mg/dL — ABNORMAL LOW (ref 1.7–2.4)

## 2022-03-24 MED ORDER — CARVEDILOL 12.5 MG PO TABS
12.5000 mg | ORAL_TABLET | Freq: Two times a day (BID) | ORAL | Status: DC
  Administered 2022-03-24: 12.5 mg
  Filled 2022-03-24: qty 1

## 2022-03-24 MED ORDER — MAGNESIUM SULFATE 4 GM/100ML IV SOLN
4.0000 g | Freq: Once | INTRAVENOUS | Status: AC
  Administered 2022-03-24: 4 g via INTRAVENOUS
  Filled 2022-03-24: qty 100

## 2022-03-24 MED ORDER — AMOXICILLIN 500 MG PO CAPS
500.0000 mg | ORAL_CAPSULE | Freq: Three times a day (TID) | ORAL | Status: AC
  Administered 2022-03-24 – 2022-03-27 (×11): 500 mg
  Filled 2022-03-24 (×11): qty 1

## 2022-03-24 NOTE — Progress Notes (Signed)
? ?NAME:  Frederick [name], MRN:  [id number], DOB:  1961/09/03, LOS: 4 ?ADMISSION DATE:  03/19/2022, CONSULTATION DATE:  03/20/22 ?REFERRING MD:  Dr. Derry LoryKhaliqdina CHIEF COMPLAINT:  Focal SE  ? ?History of Present Illness:  ?Patient is a 61 year old male with prior history of seizure, heavy alcohol abuse, asthma, and hypertension who presented on 4/14 with witnessed seizures.  Initially treated with versed with EMS which abated seizure.  On arrival to ER, patient had left hemiparesis therefore code stroke activated.  After CT, had more seizure activity requiring more ativan and keppra for cessation.   ?  ?Prior hospitalization in February for generalized tonic-clonic status epilepticus requiring intubation secondary to medication non-compliance.  Hospitalization complicated by small right cerebellum stroke.   ?  ?[facility] with [facility].  CIWA protocol active. On 4/15, patient developed focal status on left thought to be progressing to [place] despite more AED now with concern for airway.  PCCM consulted for transfer to ICU for intubation and pressure support. ?Pertinent  Medical History  ?Tobacco abuse, seizure, heavy alcohol abuse, asthma, hypertension, PE (not on AC), pulmonary TB (treated), CVA ?Significant Hospital Events: ?Including procedures, antibiotic start and stop dates in addition to other pertinent events   ?4/14 admitted [facility] r/o stroke, seizures ?4/15 PCCM consulted, focal status progressing-> intubation  ?04/16 versed gtt started and Perampanel given once.  ?04/17 phenobarbital 130 mg given once ?04/18 planned sedation wean by neurology ?Interim History / Subjective:  ?Sedated and intubated.  ?Review of Systems:   ?Negative unless stated in the subjective. ?Objective   ?Blood pressure (!) 107/58, pulse 71, temperature (!) 97.5 ?F (36.4 ?C), temperature source Axillary, resp. rate 15, height 6\' 1"  (1.854 m), weight 76.5 kg, SpO2 100 %. ?   ?Vent Mode: PRVC ?FiO2 (%):  [40 %] 40  % ?Set Rate:  [15 bmp] 15 bmp ?Vt Set:  [782[640 mL] 640 mL ?PEEP:  [5 cmH20] 5 cmH20 ?Plateau Pressure:  [17 cmH20-18 cmH20] 18 cmH20  ? ?Intake/Output Summary (Last 24 hours) at 03/24/2022 [id number] ?Last data filed at 03/24/2022 0600 ?Gross per 24 hour  ?Intake 3014.42 ml  ?Output 1825 ml  ?Net 1189.42 ml  ? ?1.8 L urine output and 1 stool occurrence.  ?Filed Weights  ? 03/19/22 2000 03/22/22 0500 03/23/22 0500  ?Weight: 78.9 kg 76.2 kg 76.5 kg  ? ?Examination: ?General: NAD, sedated and intubated ?HENT: NCAT, intubated ?Lungs: vent lung sounds ?Cardiovascular: NSR, 2+ pulses, no le edema ?Abdomen: soft, normal bowel sounds ?Extremities: no asymmetry ?Neuro: sedated and intubated, unable to fully assess ?GU:  foley in place ?Consults  ?Neurology following ?Resolved Hospital Problem list   ? ?Assessment & Plan:  ?Status epilepticus- focal but progressing to generalized  ?Hx heavy ETOH abuse and medical noncompliance ?Hx seizures with prior SE ?Patient takes Keppra 1000 mg BID at home. Concern present for non-adherence to home regimen and this was verified with significant other. In the ICU for airway and pressure support along with optimization of physiologic parameters to prevent seizures. Primary management per neurology, AEDs per neurology - keppra, vimpat, depakote, cEEG per Neurology. Versed gtt started yesterday.  Perampanel given once 04/16. No weaning till cleared by neurology. Phenobarb given yesterday and with no reported seizure, neurology will wean sedation slowly.  ?-Seizure meds as above. Phenobarbital daily.  ?- Respiratory support as below/ above ?- Maintain neuro protective measures; goal for eurothermia, euglycemia, eunatermia, normoxia, and PCO2 goal of 35-40, will check his mag and  K as it was repleted this am. ?- Nutrition and bowel regiment - RD following, high risk for refeeding syndrome ?- Seizure precautions  ?- Aspirations precautions  ?  ?Acute respiratory failure related to above ?Presumed  aspiration pneumonia ?Urinary Tract Infection ?Intubated on arrival to ICU. Continue to be intubated. No daily SAT & SBT/ weaning sedation till cleared by neurology . On Unasyn due to aspiration pneumonia and UTI. Tracheal aspirate final report showed few WBC present, few gram positive cocci and few gram negative rods. Report chest x ray performed showed no significant change from the prior one.  ?- Continue MV support, 4-8cc/kg IBW with goal Pplat <30 and DP<15  ?- VAP prevention protocol/ PPI ?- PAD protocol for sedation> propofol/ fentanyl  ?- wean FiO2 as able for SpO2 >92%  ?  ?ETOH abuse ?- sedation as above ?- empiric thiamine/ folate/ MVI ?  ?HTN ?Home meds include Coreg 25 mg BID. hold htn meds for now due to hypotension. Levophed weaned off and patient BP 130s/70s. If bp's become elevated, will restart home Coreg 25 mg BID.  ?- prn hydralazine  ?- CTM ?  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  ?- per neurology, continue ASA/ pletal for now ?- serial neuro exams  ?  ?Hypomagnesemia ?Hypokalemia ?- Mag 1.6 this am> 4gm ordered, recheck tmrw in am. K was 4.2 this am.  ?-Follow mag and K tomorrow.  ? ?Normocytic anemia ?Down-trending. Trend H/H and CTM. Will benefit from oral iron replacement once more stable given low iron and Tsat level but will defer due to acute infection. Hgb above goal for CKD. ?  ?Best Practice (right click and "Reselect all SmartList Selections" daily)  ?Diet/type: tubefeeds ?DVT prophylaxis: LMWH ?GI prophylaxis: PPI ?Lines: N/A ?Foley:  N/A ?Continuous: propofol 10 mcg, 10 mg versed ?Code Status:  full code ?Last date of multidisciplinary goals of care discussion []  ?Labs   ?BMP stable from yesterday. Mag low and repleted and phos wnl. WBC wnl. Hgb down-trending.  ? ?CBC: ?Recent Labs  ?Lab 03/19/22 ?2035 03/19/22 ?2304 03/20/22 ?03/22/22 03/20/22 ?1721 03/21/22 ?0304 03/23/22 ?03/25/22 03/24/22 ?0431  ?WBC 8.6  --  6.6  --  8.0 7.8 7.6  ?NEUTROABS  5.7  --   --   --  4.8  --   --   ?HGB 12.1*   < > 10.1* 12.6* 11.8* 11.0* 10.6*  ?HCT 40.6   < > 31.4* 37.0* 36.9* 34.2* 32.6*  ?MCV 88.8  --  85.3  --  83.1 84.0 81.9  ?PLT 295  --  204  --  281 258 200  ? < > = values in this interval not displayed.  ? ? ?Basic Metabolic Panel: ?Recent Labs  ?Lab 03/20/22 ?1448 03/20/22 ?1721 03/20/22 ?2201 03/21/22 ?0304 03/21/22 ?03/23/22 03/22/22 ?0148 03/22/22 ?03/24/22 03/22/22 ?1610 03/23/22 ?0242 03/24/22 ?0431  ?NA 132*   < > 135 133*   < > 138 141 143 142 141  ?K 4.0   < > 4.5 3.1*   < > 3.8 3.8 3.9 3.7 4.2  ?CL 102  --  103 101   < > 105 108 110 111 111  ?CO2 26  --  21* 23   < > 26 26 27 23 24   ?GLUCOSE 103*  --  109* 121*   < > 104* 109* 109* 115* 120*  ?BUN 5*  --  6* 6*   < > 11 9 11  13  14  ?CREATININE 0.82  --  0.88 0.89   < > 1.29* 1.19 1.07 1.02 0.90  ?CALCIUM 9.1  --  9.2 9.4   < > 9.0 9.0 9.2 9.2 8.7*  ?MG 1.4*  --   --  1.8  --  2.0  --   --  1.6* 1.6*  ?PHOS  --   --  2.4* 2.6  --  3.3  --   --  3.7 4.5  ? < > = values in this interval not displayed.  ? ? ?GFR: ?Estimated Creatinine Clearance: 93.3 mL/min (by C-G formula based on SCr of 0.9 mg/dL). ?Recent Labs  ?Lab 03/20/22 ?6659 03/21/22 ?0304 03/23/22 ?9357 03/24/22 ?0431  ?WBC 6.6 8.0 7.8 7.6  ? ? ?Liver Function Tests: ?Recent Labs  ?Lab 03/19/22 ?2035 03/20/22 ?0177  ?AST 18 12*  ?ALT 8 7  ?ALKPHOS 133* 94  ?BILITOT 0.3 0.3  ?PROT 7.9 5.5*  ?ALBUMIN 3.0* 2.1*  ? ? ?No results for input(s): LIPASE, AMYLASE in the last 168 hours. ?Recent Labs  ?Lab 03/20/22 ?9390  ?AMMONIA 21  ? ? ?ABG ?   ?Component Value Date/Time  ? PHART 7.502 (H) 03/20/2022 1721  ? PCO2ART 30.1 (L) 03/20/2022 1721  ? PO2ART 61 (L) 03/20/2022 1721  ? HCO3 23.7 03/20/2022 1721  ? TCO2 25 03/20/2022 1721  ? O2SAT 94 03/20/2022 1721  ? ?  ?Coagulation Profile: ?No results for input(s): INR, PROTIME in the last 168 hours. ?Cardiac Enzymes: ?No results for input(s): CKTOTAL, CKMB, CKMBINDEX, TROPONINI in the last 168 hours. ?HbA1C: ?Hgb A1c MFr  Bld  ?Date/Time Value Ref Range Status  ?01/18/2022 09:48 AM 5.9 (H) 4.8 - 5.6 % Final  ?  Comment:  ?  (NOTE) ?Pre diabetes:          5.7%-6.4% ? ?Diabetes:              >6.4% ? ?Glycemic control for   <7.0% ?a

## 2022-03-24 NOTE — Progress Notes (Signed)
Advanced ETT from 16 at the lip to 31 per MD order. No complications noted.  ?

## 2022-03-24 NOTE — Progress Notes (Signed)
Attending: ?  ? ?Subjective: ?Propofol requirement down ?Remains intubated ?Off vasopressin ?No seizure activity ? ?Objective: ?Vitals:  ? 03/24/22 0600 03/24/22 0630 03/24/22 0831 03/24/22 0832  ?BP: 134/63 (!) 147/75 (!) 142/76   ?Pulse: (!) 108 (!) 107 (!) 101   ?Resp: 15 20 17    ?Temp:  99.6 ?F (37.6 ?C)    ?TempSrc:  Oral    ?SpO2: 99% 100% 100% 100%  ?Weight:      ?Height:      ? ?Vent Mode: PRVC ?FiO2 (%):  [40 %] 40 % ?Set Rate:  [15 bmp] 15 bmp ?Vt Set:  mL] 640 mL ?PEEP:  [5 cmH20] 5 cmH20 ?Plateau Pressure:  [17 cmH20-18 cmH20] 17 cmH20 ? ?Intake/Output Summary (Last 24 hours) at 03/24/2022 0837 ?Last data filed at 03/24/2022 0600 ?Gross per 24 hour  ?Intake 2649.09 ml  ?Output 1525 ml  ?Net 1124.09 ml  ? ? ?General:  In bed on vent ?HENT: NCAT ETT in place ?PULM: CTA B, vent supported breathing ?CV: RRR, no mgr ?GI: BS+, soft, nontender ?MSK: normal bulk and tone ?Neuro: sedated on vent ? ? ? ?CBC ?   ?Component Value Date/Time  ? WBC 7.6 03/24/2022 0431  ? RBC 3.98 (L) 03/24/2022 0431  ? HGB 10.6 (L) 03/24/2022 0431  ? HCT 32.6 (L) 03/24/2022 0431  ? PLT 200 03/24/2022 0431  ? MCV 81.9 03/24/2022 0431  ? MCH 26.6 03/24/2022 0431  ? MCHC 32.5 03/24/2022 0431  ? RDW 16.5 (H) 03/24/2022 0431  ? LYMPHSABS 1.9 03/21/2022 0304  ? MONOABS 0.9 03/21/2022 0304  ? EOSABS 0.4 03/21/2022 0304  ? BASOSABS 0.0 03/21/2022 0304  ? ? ?BMET ?   ?Component Value Date/Time  ? NA 141 03/24/2022 0431  ? K 4.2 03/24/2022 0431  ? CL 111 03/24/2022 0431  ? CO2 24 03/24/2022 0431  ? GLUCOSE 120 (H) 03/24/2022 0431  ? BUN 14 03/24/2022 0431  ? CREATININE 0.90 03/24/2022 0431  ? CREATININE 0.74 10/27/2021 0000  ? CALCIUM 8.7 (L) 03/24/2022 0431  ? GFRNONAA >60 03/24/2022 0431  ? GFRAA >60 04/27/2020 0009  ? ? ?CXR images RUL scarring ? 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 ?My cc time 30 minutes ? ?5/22, MD ?[facility] ?Pager: [phone] ?Cell: [phone] ?After 7pm: [phone] ? ?

## 2022-03-24 NOTE — Progress Notes (Signed)
[facility] ADULT ICU REPLACEMENT PROTOCOL ? ? ?The patient does apply for the [facility] Adult ICU Electrolyte Replacment Protocol based on the criteria listed below:  ? ?1.Exclusion criteria: TCTS patients, ECMO patients, and Dialysis patients ?2. Is GFR >/= 30 ml/min? Yes.    ?Patient's GFR today is >60 ?3. Is SCr </= 2? Yes.   ?Patient's SCr is 0.9 mg/dL ?4. Did SCr increase >/= 0.5 in 24 hours? No. ?5.Pt's weight >40kg  Yes.   ?6. Abnormal electrolyte(s):   Mg 1.6  ?7. Electrolytes replaced per protocol ?8.  Call MD STAT for K+ </= 2.5, Phos </= 1, or Mag </= 1 ?Physician:  Wyn Forster ? ?Sedonia Small 03/24/2022 5:55 AM ? ?

## 2022-03-24 NOTE — Progress Notes (Signed)
Subjective: No acute events overnight. ? ?ROS: Unable to obtain due to poor mental status ? ?Examination ? ?Vital signs in last 24 hours: ?Temp:  [98.1 ?F (36.7 ?C)-99.6 ?F (37.6 ?C)] 99 ?F (37.2 ?C) (04/19 1114) ?Pulse Rate:  [93-111] 101 (04/19 1200) ?Resp:  [13-25] 15 (04/19 1200) ?BP: (111-160)/(57-82) 144/78 (04/19 1200) ?SpO2:  [98 %-100 %] 100 % (04/19 1200) ?FiO2 (%):  [40 %] 40 % (04/19 1200) ? ?General: lying in bed, NAD ?RS: Intubated ?Neuro: Off sedation, comatose, winces but does not open eyes to noxious stimulation, pupils equal round and reactive to light, corneal reflex intact, gag reflex intact, winces but does not withdraw to noxious stimuli in all 4 extremities ? ?Basic Metabolic Panel: ?Recent Labs  ?Lab 03/20/22 ?1448 03/20/22 ?1721 03/20/22 ?2201 03/21/22 ?0304 03/21/22 ?0233 03/22/22 ?0148 03/22/22 ?4356 03/22/22 ?1610 03/23/22 ?0242 03/24/22 ?0431  ?NA 132*   < > 135 133*   < > 138 141 143 142 141  ?K 4.0   < > 4.5 3.1*   < > 3.8 3.8 3.9 3.7 4.2  ?CL 102  --  103 101   < > 105 108 110 111 111  ?CO2 26  --  21* 23   < > 26 26 27 23 24   ?GLUCOSE 103*  --  109* 121*   < > 104* 109* 109* 115* 120*  ?BUN 5*  --  6* 6*   < > 11 9 11 13 14   ?CREATININE 0.82  --  0.88 0.89   < > 1.29* 1.19 1.07 1.02 0.90  ?CALCIUM 9.1  --  9.2 9.4   < > 9.0 9.0 9.2 9.2 8.7*  ?MG 1.4*  --   --  1.8  --  2.0  --   --  1.6* 1.6*  ?PHOS  --   --  2.4* 2.6  --  3.3  --   --  3.7 4.5  ? < > = values in this interval not displayed.  ? ? ?CBC: ?Recent Labs  ?Lab 03/19/22 ?2035 03/19/22 ?2304 03/20/22 ?03/21/22 03/20/22 ?1721 03/21/22 ?0304 03/23/22 ?03/23/22 03/24/22 ?0431  ?WBC 8.6  --  6.6  --  8.0 7.8 7.6  ?NEUTROABS 5.7  --   --   --  4.8  --   --   ?HGB 12.1*   < > 10.1* 12.6* 11.8* 11.0* 10.6*  ?HCT 40.6   < > 31.4* 37.0* 36.9* 34.2* 32.6*  ?MCV 88.8  --  85.3  --  83.1 84.0 81.9  ?PLT 295  --  204  --  281 258 200  ? < > = values in this interval not displayed.  ? ? ? ?Coagulation Studies: ?No results for input(s): LABPROT,  INR in the last 72 hours. ? ?Imaging ?No new brain imaging overnight. ?  ?  ?ASSESSMENT AND PLAN: 61 year old male who presented with focal convulsive status epilepticus (left upper extremity twitching) in the setting of hypoglycemia.  EEG showed right hemispheric status epilepticus which has gradually improved. ?   ?Focal convulsive status epilepticus, resolved ?Acute encephalopathy, due to seizure and medication ?Chronic strokes ?Alcohol use disorder ?-LTM EEG showed  evidence of epileptogenicity and cortical dysfunction arising from right hemisphere with high potential for seizure recurrence. Additionally there is severe diffuse encephalopathy, likely due to seizure, sedation.  No definite seizures were seen during the study. ?   ?Recommendations ?- Will wean propofol at 10 mcg/h to stop.   ?- Will then stop versed ?- Continue  IV phenobarbital 130 mg daily, Vimpat 150 mg twice daily, Keppra 1500 mg twice daily, Depakote 500 mg every 6 hours ?- wil check phenobarb and depakote level ?-Continue LTM EEG until patient is seizure-free for 24 hours after waning sedation ?-Continue seizure precautions ?-Management of rest of comorbidities per primary team ?-Discussed plan with Dr. Kendrick Fries ?   ?CRITICAL CARE ?Performed by: Charlsie Quest ?  ?  ?Total critical care time: ?  ?Critical care time was exclusive of separately billable procedures and treating other patients. ?  ?Critical care was necessary to treat or prevent imminent or life-threatening deterioration. ?  ?Critical care was time spent personally by me on the following activities: development of treatment plan with patient and/or surrogate as well as nursing, discussions with consultants, evaluation of patient's response to treatment, examination of patient, obtaining history from patient or surrogate, ordering and performing treatments and interventions, ordering and review of laboratory studies, ordering and review of radiographic studies, pulse  oximetry and re-evaluation of patient's condition. ? ? ?Lindie Spruce ?Epilepsy ?[facility] ?For questions after 5pm please refer to AMION to reach the Neurologist on call ? ?

## 2022-03-24 NOTE — Progress Notes (Signed)
LTM maint complete - no skin breakdown under:   ?FP1 FP2 C3 P8 ? ?

## 2022-03-24 NOTE — Procedures (Addendum)
Patient Name: Frederick [name]  ?MRN: [id number]  ?Epilepsy Attending: Lora Havens  ?Referring Physician/Provider: Donnetta Simpers, MD ?Duration: 03/23/2022 1242 to  03/24/2022 1242 ?  ?Patient history: 61 y.o. male with a history of encephalopathy who is undergoing an EEG to evaluate for seizures. ?  ?Level of alertness: Comatose ?  ?AEDs during EEG study: LEV, LCM, VPA, propofol, versed,  Phenobarb ?  ?Technical aspects: This EEG study was done with scalp electrodes positioned according to the 10-20 International system of electrode placement. Electrical activity was acquired at a sampling rate of 500Hz  and reviewed with a high frequency filter of 70Hz  and a low frequency filter of 1Hz . EEG data were recorded continuously and digitally stored.  ?  ?Description: EEG showed continuous generalized and lateralized right hemisphere 3-6hz  theta-delta slowing. Lateralized periodic discharges with overriding fast activity was noted in right hemisphere at 1hz .  Hyperventilation and photic stimulation were not performed.    ?  ?ABNORMALITY ?- Lateralized periodic discharges with overriding fast activity (LPD+F), right hemiphere ?- Continuous sow, generalized and lateralized right hemisphere  ?  ?IMPRESSION: ?This study showed evidence of epileptogenicity and cortical dysfunction arising from right hemisphere with high potential for seizure recurrence. Additionally there is severe diffuse encephalopathy, likely due to seizure, sedation.  No definite seizures were seen during the study. ?  ?  ?Yonas Bunda Barbra Sarks  ?

## 2022-03-25 DIAGNOSIS — R569 Unspecified convulsions: Secondary | ICD-10-CM | POA: Diagnosis not present

## 2022-03-25 LAB — BASIC METABOLIC PANEL
Anion gap: 4 — ABNORMAL LOW (ref 5–15)
BUN: 15 mg/dL (ref 8–23)
CO2: 26 mmol/L (ref 22–32)
Calcium: 9.1 mg/dL (ref 8.9–10.3)
Chloride: 106 mmol/L (ref 98–111)
Creatinine, Ser: 0.8 mg/dL (ref 0.61–1.24)
GFR, Estimated: >60 mL/min (ref >60)
Glucose, Bld: 116 mg/dL — ABNORMAL HIGH (ref 70–99)
Potassium: 5 mmol/L (ref 3.5–5.1)
Sodium: 136 mmol/L (ref 135–145)

## 2022-03-25 LAB — GLUCOSE, CAPILLARY
Glucose-Capillary: 110 mg/dL — ABNORMAL HIGH (ref 70–99)
Glucose-Capillary: 112 mg/dL — ABNORMAL HIGH (ref 70–99)
Glucose-Capillary: 112 mg/dL — ABNORMAL HIGH (ref 70–99)
Glucose-Capillary: 114 mg/dL — ABNORMAL HIGH (ref 70–99)
Glucose-Capillary: 119 mg/dL — ABNORMAL HIGH (ref 70–99)
Glucose-Capillary: 120 mg/dL — ABNORMAL HIGH (ref 70–99)

## 2022-03-25 LAB — MAGNESIUM: Magnesium: 2 mg/dL (ref 1.7–2.4)

## 2022-03-25 LAB — PHENOBARBITAL LEVEL: Phenobarbital: 7.2 ug/mL — ABNORMAL LOW (ref 15.0–30.0)

## 2022-03-25 LAB — VALPROIC ACID LEVEL: Valproic Acid Lvl: 49 ug/mL — ABNORMAL LOW (ref 50.0–100.0)

## 2022-03-25 MED ORDER — VITAL AF 1.2 CAL PO LIQD
1000.0000 mL | ORAL | Status: DC
  Administered 2022-03-25 – 2022-03-29 (×7): 1000 mL

## 2022-03-25 MED ORDER — POLYETHYLENE GLYCOL 3350 17 G PO PACK: 17.0000 g | PACK | Freq: Every day | ORAL | Status: DC | PRN

## 2022-03-25 MED ORDER — CARVEDILOL 25 MG PO TABS
25.0000 mg | ORAL_TABLET | Freq: Two times a day (BID) | ORAL | Status: DC
  Administered 2022-03-25 – 2022-04-03 (×17): 25 mg
  Filled 2022-03-25 (×17): qty 1

## 2022-03-25 MED ORDER — DOCUSATE SODIUM 50 MG/5ML PO LIQD
100.0000 mg | Freq: Two times a day (BID) | ORAL | Status: DC | PRN
  Administered 2022-04-03: 100 mg
  Filled 2022-03-25: qty 10

## 2022-03-25 MED ORDER — PHENOBARBITAL SODIUM 130 MG/ML IJ SOLN
130.0000 mg | Freq: Two times a day (BID) | INTRAMUSCULAR | Status: DC
  Administered 2022-03-25 – 2022-03-30 (×10): 130 mg via INTRAVENOUS
  Filled 2022-03-25 (×10): qty 1

## 2022-03-25 NOTE — TOC Progression Note (Signed)
Transition of Care (TOC) - Progression Note  ? ? ?Patient Details  ?Name: Frederick [name] ?MRN: [id number] ?Date of Birth: Dec 03, 1961 ? ?Transition of Care (TOC) CM/SW Contact  ?Beckie Busing, RN ?Phone [phone] ? ?03/25/2022, 12:46 PM ? ?Clinical Narrative:    ?Patient remains on vent. TOC unable to complete high risk assessment. No family at bedside.  ? ? ?  ?  ? ?Expected Discharge Plan and Services ?  ?  ?  ?  ?  ?                ?  ?  ?  ?  ?  ?  ?  ?  ?  ?  ? ? ?Social Determinants of Health (SDOH) Interventions ?  ? ?Readmission Risk Interventions ? ?  01/12/2022  ?  3:25 PM  ?Readmission Risk Prevention Plan  ?Transportation Screening Complete  ?Medication Review Oceanographer) Referral to Pharmacy  ?PCP or Specialist appointment within 3-5 days of discharge Not Complete  ?PCP/Specialist Appt Not Complete comments Patient not ready for d/c  ?HRI or Home Care Consult Not Complete  ?SW Recovery Care/Counseling Consult Complete  ?Palliative Care Screening Not Applicable  ?Skilled Nursing Facility Complete  ? ? ?

## 2022-03-25 NOTE — Progress Notes (Signed)
Nutrition Follow-up ? ?DOCUMENTATION CODES:  ? ?Non-severe (moderate) malnutrition in context of social or environmental circumstances ? ?INTERVENTION:  ? ?Tube feeding via OG tube: ?Vital AF 1.2, increase goal rate to 80 ml/h (1920 ml per day). ?D/C Prosource TF. ? ?Provides 2304 kcal, 144 gm protein, 1557 ml free water daily. ? ?NUTRITION DIAGNOSIS:  ? ?Moderate Malnutrition related to social / environmental circumstances (alcohol abuse) as evidenced by mild fat depletion, mild muscle depletion, moderate muscle depletion. ? ?GOAL:  ? ?Patient will meet greater than or equal to 90% of their needs ? ?MONITOR:  ? ?Vent status, TF tolerance, Labs ? ?REASON FOR ASSESSMENT:  ? ?Ventilator, Consult ?Enteral/tube feeding initiation and management ? ?ASSESSMENT:  ? ?61 yo male admitted with witnessed seizures. PMH includes seizure, heavy alcohol abuse, asthma, HTN. ? ?Discussed patient in ICU rounds and with RN today. ?Continuous EEG ongoing. ?Patient is having copious secretions. ?More awake, but not following commands today. ?Versed is off since this morning; phenobarbital is being increased. ?Propofol has been discontinued. ? ?OG tube in place. Currently receiving Vital AF 1.2 at 50 ml/h with Prosource TF 45 ml TID. Tolerating well. ?Now that propofol is off, will adjust TF regimen to better meet 100% of estimated needs. ? ?Patient remains on ventilator support ?MV: 12.5 L/min ?Temp (24hrs), Avg:98.7 ?F (37.1 ?C), Min:98.3 ?F (36.8 ?C), Max:98.9 ?F (37.2 ?C) ? ?Propofol: off ? ?Labs reviewed.  ?CBG: 112-119-120 ? ?Medications reviewed and include folic acid, MVI with minerals, Phenobarbital, thiamine, Keppra. ?  ?Diet Order:   ?Diet Order   ? ?       ?  Diet NPO time specified  Diet effective now       ?  ? ?  ?  ? ?  ? ? ?EDUCATION NEEDS:  ? ?No education needs have been identified at this time ? ?Skin:  Skin Assessment: Reviewed RN Assessment ? ?Last BM:  4/20 type 7 ? ?Height:  ? ?Ht Readings from Last 1 Encounters:   ?03/19/22 6\' 1"  (1.854 m)  ? ? ?Weight:  ? ?Wt Readings from Last 1 Encounters:  ?03/25/22 79.2 kg  ? ? ? ?BMI:  Body mass index is 23.04 kg/m?. ? ?Estimated Nutritional Needs:  ? ?Kcal:  2200-2400 kcal/d ? ?Protein:  115-140 gm ? ?Fluid:  2.2-2.4 L/d ? ? ?03/27/22 RD, LDN, CNSC ?Please refer to Amion for contact information.                                                       ? ?

## 2022-03-25 NOTE — Procedures (Addendum)
Patient Name: Frederick [name]  ?MRN: [id number]  ?Epilepsy Attending: Charlsie Quest  ?Referring Physician/Provider: Erick Blinks, MD ?Duration: 03/24/2022 1242 to 03/25/2022 1242 ?  ?Patient history: 61 y.o. male with a history of encephalopathy who is undergoing an EEG to evaluate for seizures. ?  ?Level of alertness: lethargic ?  ?AEDs during EEG study: LEV, LCM, VPA, Phenobarb ?  ?Technical aspects: This EEG study was done with scalp electrodes positioned according to the 10-20 International system of electrode placement. Electrical activity was acquired at a sampling rate of 500Hz  and reviewed with a high frequency filter of 70Hz  and a low frequency filter of 1Hz . EEG data were recorded continuously and digitally stored.  ?  ?Description: EEG showed continuous generalized and lateralized right hemisphere 3-6hz  theta-delta slowing. Lateralized periodic discharges with overriding fast activity was noted in right hemisphere at 1-1.5Hz , at times appears rhythmic.  Hyperventilation and photic stimulation were not performed.    ?  ?ABNORMALITY ?- Lateralized periodic discharges with overriding fast activity (LPD+F), right hemiphere ?- Continuous sow, generalized and lateralized right hemisphere  ?  ?IMPRESSION: ?This study showed evidence of epileptogenicity and cortical dysfunction arising from right hemisphere with high potential for seizure recurrence. Additionally there is severe diffuse encephalopathy, likely due to seizure, sedation.  No definite seizures were seen during the study. ?  ?  ?[name]  ?

## 2022-03-25 NOTE — Progress Notes (Signed)
Attending: ?  ? ?Subjective: ?Off versed ?More awake> opening his eyes spontaneously ?Rectal tube placed last night ?No fever ?BP up some, started on coreg ?Had loose stools yesterday morning ? ?Objective: ?Vitals:  ? 03/25/22 0500 03/25/22 0754 03/25/22 0755 03/25/22 0800  ?BP:    (!) 163/91  ?Pulse:  93  96  ?Resp:  15  17  ?Temp:    98.9 ?F (37.2 ?C)  ?TempSrc:      ?SpO2:  100% 100% 100%  ?Weight: 79.2 kg     ?Height:      ? ?Vent Mode: PRVC ?FiO2 (%):  [30 %-40 %] 30 % ?Set Rate:  [15 bmp] 15 bmp ?Vt Set:  [id number] mL] 640 mL ?PEEP:  [5 cmH20] 5 cmH20 ?Plateau Pressure:  [16 cmH20-24 cmH20] 16 cmH20 ? ?Intake/Output Summary (Last 24 hours) at 03/25/2022 0848 ?Last data filed at 03/25/2022 [phone] ?Gross per 24 hour  ?Intake 1739.35 ml  ?Output 2600 ml  ?Net -860.65 ml  ? ? ?General:  In bed on vent ?HENT: NCAT ETT in place ?PULM: CTA B, vent supported breathing ?CV: RRR, no mgr ?GI: BS+, soft, nontender ?MSK: normal bulk and tone ?Neuro: sedated, doesn't open eyes for me ? ? ?CBC ?   ?Component Value Date/Time  ? WBC 7.6 03/24/2022 0431  ? RBC 3.98 (L) 03/24/2022 0431  ? HGB 10.6 (L) 03/24/2022 0431  ? HCT 32.6 (L) 03/24/2022 0431  ? PLT 200 03/24/2022 0431  ? MCV 81.9 03/24/2022 0431  ? MCH 26.6 03/24/2022 0431  ? MCHC 32.5 03/24/2022 0431  ? RDW 16.5 (H) 03/24/2022 0431  ? LYMPHSABS 1.9 03/21/2022 0304  ? MONOABS 0.9 03/21/2022 0304  ? EOSABS 0.4 03/21/2022 0304  ? BASOSABS 0.0 03/21/2022 0304  ? ? ?BMET ?   ?Component Value Date/Time  ? NA 136 03/25/2022 0103  ? K 5.0 03/25/2022 0103  ? CL 106 03/25/2022 0103  ? CO2 26 03/25/2022 0103  ? GLUCOSE 116 (H) 03/25/2022 0103  ? BUN 15 03/25/2022 0103  ? CREATININE 0.80 03/25/2022 0103  ? CREATININE 0.74 10/27/2021 0000  ? CALCIUM 9.1 03/25/2022 0103  ? GFRNONAA >60 03/25/2022 0103  ? GFRAA >60 04/27/2020 0009  ? ? ?CXR images none today ? ?Impression/plan ?Acute respiratory failure with hypoxemia > wean vent today ?Status epilepticus> continue AEDs per neurology, monitor  LTM EEG off versed, f/u neurology recommendations ?Aspiration pneumonia/enterococcus UTI> amoxicllin alone, stop date set ?Hypertension> increase coreg to 25mg   ? ?My cc time 31 minutes ? ?Roselie Awkward, MD ?[facility] ?Pager: [phone] ?Cell: [phone] ?After 7pm: [id number] ? ?

## 2022-03-25 NOTE — Progress Notes (Signed)
LTM maint complete 

## 2022-03-25 NOTE — Progress Notes (Signed)
eLink Physician-Brief Progress Note ?Patient Name: Frederick [name] ?DOB: 05-18-1961 ?MRN: [id number] ? ? ?Date of Service ? 03/25/2022  ?HPI/Events of Note ? Patient with frequent watery stools, does not appear infected, more like enteric nutrition induced and exacerbated by stool softener, which is on hold currently.  ?eICU Interventions ? Rectal tube ordered for 24 hours to give the effect of the stool softeners time to wear off.  ? ? ? ?  ? ?Frederick [name] ?03/25/2022, 4:48 AM ?

## 2022-03-25 NOTE — Progress Notes (Signed)
Subjective: No acute events overnight.  More awake today but not following commands. ? ?ROS: Unable to obtain due to poor mental status ? ?Examination ? ?Vital signs in last 24 hours: ?Temp:  [98.3 ?F (36.8 ?C)-98.9 ?F (37.2 ?C)] 98.9 ?F (37.2 ?C) (04/20 0800) ?Pulse Rate:  [91-103] 101 (04/20 1155) ?Resp:  [14-36] 21 (04/20 1155) ?BP: (136-174)/(75-94) 164/91 (04/20 1155) ?SpO2:  [92 %-100 %] 100 % (04/20 1155) ?FiO2 (%):  [30 %-40 %] 30 % (04/20 1155) ?Weight:  [79.2 kg] 79.2 kg (04/20 0500) ? ?General: lying in bed, NAD ?RS: Intubated ?Neuro: Off sedation, awake but not following commands, does not track examiner in the room, pupils equal round and reactive to light, blinking, cough reflex intact, winces but does not withdraw to noxious stimuli in bilateral upper extremities, withdraws to noxious stimuli in bilateral lower extremities ? ?Basic Metabolic Panel: ?Recent Labs  ?Lab 03/20/22 ?2201 03/21/22 ?0304 03/21/22 ?7425 03/22/22 ?0148 03/22/22 ?9563 03/22/22 ?1610 03/23/22 ?8756 03/24/22 ?4332 03/25/22 ?0103  ?NA 135 133*   < > 138 141 143 142 141 136  ?K 4.5 3.1*   < > 3.8 3.8 3.9 3.7 4.2 5.0  ?CL 103 101   < > 105 108 110 111 111 106  ?CO2 21* 23   < > 26 26 27 23 24 26   ?GLUCOSE 109* 121*   < > 104* 109* 109* 115* 120* 116*  ?BUN 6* 6*   < > 11 9 11 13 14 15   ?CREATININE 0.88 0.89   < > 1.29* 1.19 1.07 1.02 0.90 0.80  ?CALCIUM 9.2 9.4   < > 9.0 9.0 9.2 9.2 8.7* 9.1  ?MG  --  1.8  --  2.0  --   --  1.6* 1.6* 2.0  ?PHOS 2.4* 2.6  --  3.3  --   --  3.7 4.5  --   ? < > = values in this interval not displayed.  ? ? ?CBC: ?Recent Labs  ?Lab 03/19/22 ?2035 03/19/22 ?2304 03/20/22 ?03/21/22 03/20/22 ?1721 03/21/22 ?0304 03/23/22 ?03/23/22 03/24/22 ?0431  ?WBC 8.6  --  6.6  --  8.0 7.8 7.6  ?NEUTROABS 5.7  --   --   --  4.8  --   --   ?HGB 12.1*   < > 10.1* 12.6* 11.8* 11.0* 10.6*  ?HCT 40.6   < > 31.4* 37.0* 36.9* 34.2* 32.6*  ?MCV 88.8  --  85.3  --  83.1 84.0 81.9  ?PLT 295  --  204  --  281 258 200  ? < > = values in  this interval not displayed.  ? ? ? ?Coagulation Studies: ?No results for input(s): LABPROT, INR in the last 72 hours. ? ?Imaging ?No new brain imaging overnight ? ?ASSESSMENT AND PLAN:  61 year old male who presented with focal convulsive status epilepticus (left upper extremity twitching) in the setting of hypoglycemia.  EEG showed right hemispheric status epilepticus which has gradually improved. ?   ?Focal convulsive status epilepticus, resolved ?Acute encephalopathy, due to seizure and medication ?Chronic strokes ?Alcohol use disorder ?- LTM showed evidence of epileptogenicity and cortical dysfunction arising from right hemisphere with high potential for seizure recurrence. Additionally there is severe diffuse encephalopathy, likely due to seizure, sedation.  No definite seizures were seen during the study. ? ?Recommendations ?-Increase phenobarbital to 130 mg twice daily ?-Continue Vimpat 150 mg twice daily, Keppra 1500 mg twice daily, Depakote 500 mg every 6 hours ?-Depakote level is lower likely due to initiation  of phenobarbital.  We will plan to wean Depakote first once patient is more awake and following commands ?-Continue LTM EEG until tomorrow due to LPD plus pattern.  If no seizures overnight, will consider discontinuing tomorrow ?-Continue seizure precautions ?-Management of rest of comorbidities per primary team ?-Discussed plan with Dr. Kendrick Fries ?   ? ?I have spent a total of  36  minutes with the patient reviewing hospital notes,  test results, labs and examining the patient as well as establishing an assessment and plan.  > 50% of time was spent in direct patient care.  ?  ?Lindie Spruce ?Epilepsy ?[facility] ?For questions after 5pm please refer to AMION to reach the Neurologist on call ? ?

## 2022-03-25 NOTE — Progress Notes (Signed)
? ?NAME:  Frederick [name], MRN:  [id number], DOB:  [birth year], LOS: 5 ?ADMISSION DATE:  03/19/2022, CONSULTATION DATE:  03/20/22 ?REFERRING MD:  Dr. Derry LoryKhaliqdina CHIEF COMPLAINT:  Focal SE  ? ?History of Present Illness:  ?Patient is a 61 year old male with prior history of seizure, heavy alcohol abuse, asthma, and hypertension who presented on 4/14 with witnessed seizures.  Initially treated with versed with EMS which abated seizure.  On arrival to ER, patient had left hemiparesis therefore code stroke activated.  After CT, had more seizure activity requiring more ativan and keppra for cessation.   ?  ?Prior hospitalization in February for generalized tonic-clonic status epilepticus requiring intubation secondary to medication non-compliance.  Hospitalization complicated by small right cerebellum stroke.   ?  ?[facility] was [facility] with neurology following.  CIWA protocol active. On 4/15, patient developed focal status on left thought to be progressing to GTC despite more AED now with concern for airway.  PCCM consulted for transfer to ICU for intubation and pressure support. ?Pertinent  Medical History  ?Tobacco abuse, seizure, heavy alcohol abuse, asthma, hypertension, PE (not on AC), pulmonary TB (treated), CVA ?Significant Hospital Events: ?Including procedures, antibiotic start and stop dates in addition to other pertinent events   ?4/14 admitted [facility] r/o stroke, seizures ?4/15 PCCM consulted, focal status progressing-> intubation  ?04/16 versed gtt started and Perampanel given once.  ?04/17 phenobarbital 130 mg given once ?04/18 planned sedation wean by neurology ?Interim History / Subjective:  ?Sedated and intubated. Flexes to pain.  ?Review of Systems:   ?Negative unless stated in the subjective. ?Objective Bps elevated with 167/87 highest.   ?Blood pressure (!) 107/58, pulse 71, temperature (!) 97.5 ?F (36.4 ?C), temperature source Axillary, resp. rate 15, height 6\' 1"  (1.854 m), weight 76.5 kg, SpO2 100  %. ?   ?Vent Mode: PRVC ?FiO2 (%):  [30 %-40 %] 30 % ?Set Rate:  [15 bmp] 15 bmp ?Vt Set:  [914[640 mL] 640 mL ?PEEP:  [5 cmH20] 5 cmH20 ?Plateau Pressure:  [17 cmH20-24 cmH20] 22 cmH20  ? ?Intake/Output Summary (Last 24 hours) at 03/25/2022 0718 ?Last data filed at 03/25/2022 [phone] ?Gross per 24 hour  ?Intake 1857.29 ml  ?Output 2600 ml  ?Net -742.71 ml  ? ?2.6 L urine output and 3 stool occurrence.  ?Filed Weights  ? 03/22/22 0500 03/23/22 0500 03/25/22 0500  ?Weight: 76.2 kg 76.5 kg 79.2 kg  ? ?Examination: ?General: NAD, sedated and intubated ?HENT: intubated, PERRL ?Lungs: coarse ventilator sounds ?Cardiovascular: NSR, 2+ pulses in all extremities, no LE edema ?Abdomen: soft, bowel sounds present ?Extremities: no asymmetry ?Neuro: sedated, flexes to pain ?GU:  catheter in place.  ?Consults  ?Neurology following ?Resolved Hospital Problem list   ? ?Assessment & Plan:  ?Status epilepticus- focal but progressing to generalized  ?Hx heavy ETOH abuse and medical noncompliance ?Hx seizures with prior SE ?Patient takes Keppra 1000 mg BID at home. Concern present for non-adherence to home regimen and this was verified with significant other. In the ICU for airway and pressure support along with optimization of physiologic parameters to prevent seizures. Primary management per neurology, AEDs per neurology - keppra, vimpat, depakote, cEEG per Neurology. Versed gtt started yesterday.  Perampanel given once 04/16. No weaning till cleared by neurology. Phenobarbital was once daily but now BID since EEG showing epileptiform activity but no seizure as sedation has been weaned.  ?-Seizure meds as above. Phenobarbital 130 mg BID.  ?- Respiratory support as below/ above ?- Maintain  neuro protective measures; goal for eurothermia, euglycemia, eunatermia, normoxia, and PCO2 goal of 35-40, will check his mag and K as it was repleted this am. ?- Nutrition and bowel regiment - RD following, high risk for refeeding syndrome ?- Seizure  precautions  ?- Aspirations precautions  ?  ?Acute respiratory failure related to above ?Presumed aspiration pneumonia ?Urinary Tract Infection ?Intubated on arrival to ICU. Continue to be intubated. No daily SAT & SBT/ weaning sedation till cleared by neurology. On amoxicillin 500 mg TID with end date of 03/25/22. Tracheal aspirate final report showed few WBC present, few gram positive cocci and few gram negative rods. Report chest x ray performed showed no significant change from the prior one.  ?- Continue MV support, 4-8cc/kg IBW with goal Pplat <30 and DP<15. Will wean off as patient is off sedation.  ?- VAP prevention protocol/ PPI ?- PAD protocol for sedation> propofol/ fentanyl  ?- wean FiO2 as able for SpO2 >92%  ?  ?ETOH abuse ?- sedation as above ?- empiric thiamine/ folate/ MVI ?  ?HTN ?Home meds include Coreg 25 mg BID. Levophed weaned off and patient BP 140/70s. ?-Will restart home dose Coreg 25 mg BID ?- CTM ?  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  ?- per neurology, continue ASA/ pletal for now ?- serial neuro exams  ?  ?Hypomagnesemia ?Hypokalemia ?- Mag 2.0 this am. was 4.2 this am. K 5.0. ?-Follow mag and K tomorrow.  ? ?Normocytic anemia ?Downtrending but stable. Will repeat CBC tomorrow. CTM. Will benefit from oral iron replacement once more stable given low iron and Tsat level but will defer due to acute infection. Hgb above goal for CKD. ?  ?Best Practice (right click and "Reselect all SmartList Selections" daily)  ?Diet/type: tubefeeds ?DVT prophylaxis: LMWH ?GI prophylaxis: PPI ?Lines: N/A ?Foley:  N/A ?Continuous: None ?Code Status:  full code ?Last date of multidisciplinary goals of care discussion []  ?Labs   ?BMP stable from yesterday. Mag low and repleted and phos wnl. WBC wnl. Hgb down-trending.  ? ?CBC: ?Recent Labs  ?Lab 03/19/22 ?2035 03/19/22 ?2304 03/20/22 ?03/22/22 03/20/22 ?1721 03/21/22 ?0304 03/23/22 ?03/25/22 03/24/22 ?0431  ?WBC 8.6  --   6.6  --  8.0 7.8 7.6  ?NEUTROABS 5.7  --   --   --  4.8  --   --   ?HGB 12.1*   < > 10.1* 12.6* 11.8* 11.0* 10.6*  ?HCT 40.6   < > 31.4* 37.0* 36.9* 34.2* 32.6*  ?MCV 88.8  --  85.3  --  83.1 84.0 81.9  ?PLT 295  --  204  --  281 258 200  ? < > = values in this interval not displayed.  ? ? ?Basic Metabolic Panel: ?Recent Labs  ?Lab 03/20/22 ?2201 03/21/22 ?0304 03/21/22 ?03/23/22 03/22/22 ?0148 03/22/22 ?03/24/22 03/22/22 ?1610 03/23/22 ?03/25/22 03/24/22 ?03/26/22 03/25/22 ?0103  ?NA 135 133*   < > 138 141 143 142 141 136  ?K 4.5 3.1*   < > 3.8 3.8 3.9 3.7 4.2 5.0  ?CL 103 101   < > 105 108 110 111 111 106  ?CO2 21* 23   < > 26 26 27 23 24 26   ?GLUCOSE 109* 121*   < > 104* 109* 109* 115* 120* 116*  ?BUN 6* 6*   < > 11 9 11 13 14 15   ?CREATININE 0.88 0.89   < > 1.29* 1.19 1.07 1.02 0.90 0.80  ?CALCIUM 9.2  9.4   < > 9.0 9.0 9.2 9.2 8.7* 9.1  ?MG  --  1.8  --  2.0  --   --  1.6* 1.6* 2.0  ?PHOS 2.4* 2.6  --  3.3  --   --  3.7 4.5  --   ? < > = values in this interval not displayed.  ? ? ?GFR: ?Estimated Creatinine Clearance: 108.6 mL/min (by C-G formula based on SCr of 0.8 mg/dL). ?Recent Labs  ?Lab 03/20/22 ?9024 03/21/22 ?0304 03/23/22 ?0973 03/24/22 ?0431  ?WBC 6.6 8.0 7.8 7.6  ? ? ?Liver Function Tests: ?Recent Labs  ?Lab 03/19/22 ?2035 03/20/22 ?5329  ?AST 18 12*  ?ALT 8 7  ?ALKPHOS 133* 94  ?BILITOT 0.3 0.3  ?PROT 7.9 5.5*  ?ALBUMIN 3.0* 2.1*  ? ? ?No results for input(s): LIPASE, AMYLASE in the last 168 hours. ?Recent Labs  ?Lab 03/20/22 ?9242  ?AMMONIA 21  ? ? ?ABG ?   ?Component Value Date/Time  ? PHART 7.502 (H) 03/20/2022 1721  ? PCO2ART 30.1 (L) 03/20/2022 1721  ? PO2ART 61 (L) 03/20/2022 1721  ? HCO3 23.7 03/20/2022 1721  ? TCO2 25 03/20/2022 1721  ? O2SAT 94 03/20/2022 1721  ? ?  ?Coagulation Profile: ?No results for input(s): INR, PROTIME in the last 168 hours. ?Cardiac Enzymes: ?No results for input(s): CKTOTAL, CKMB, CKMBINDEX, TROPONINI in the last 168 hours. ?HbA1C: ?Hgb A1c MFr Bld  ?Date/Time Value Ref Range  Status  ?01/18/2022 09:48 AM 5.9 (H) 4.8 - 5.6 % Final  ?  Comment:  ?  (NOTE) ?Pre diabetes:          5.7%-6.4% ? ?Diabetes:              >6.4% ? ?Glycemic control for   <7.0% ?adults with diabetes ?  ?01/31/2

## 2022-03-26 DIAGNOSIS — R569 Unspecified convulsions: Secondary | ICD-10-CM | POA: Diagnosis not present

## 2022-03-26 LAB — GLUCOSE, CAPILLARY
Glucose-Capillary: 130 mg/dL — ABNORMAL HIGH (ref 70–99)
Glucose-Capillary: 100 mg/dL — ABNORMAL HIGH (ref 70–99)
Glucose-Capillary: 117 mg/dL — ABNORMAL HIGH (ref 70–99)
Glucose-Capillary: 104 mg/dL — ABNORMAL HIGH (ref 70–99)
Glucose-Capillary: 98 mg/dL (ref 70–99)
Glucose-Capillary: 117 mg/dL — ABNORMAL HIGH (ref 70–99)

## 2022-03-26 LAB — CBC
HCT: 36.4 % — ABNORMAL LOW (ref 39.0–52.0)
Hemoglobin: 11.9 g/dL — ABNORMAL LOW (ref 13.0–17.0)
MCH: 26.7 pg (ref 26.0–34.0)
MCHC: 32.7 g/dL (ref 30.0–36.0)
MCV: 81.6 fL (ref 80.0–100.0)
Platelets: 259 10*3/uL (ref 150–400)
RBC: 4.46 MIL/uL (ref 4.22–5.81)
RDW: 16.5 % — ABNORMAL HIGH (ref 11.5–15.5)
WBC: 9.7 10*3/uL (ref 4.0–10.5)
nRBC: 0 % (ref 0.0–0.2)

## 2022-03-26 LAB — BASIC METABOLIC PANEL
Anion gap: 7 (ref 5–15)
BUN: 18 mg/dL (ref 8–23)
CO2: 25 mmol/L (ref 22–32)
Calcium: 9.5 mg/dL (ref 8.9–10.3)
Chloride: 104 mmol/L (ref 98–111)
Creatinine, Ser: 0.75 mg/dL (ref 0.61–1.24)
GFR, Estimated: >60 mL/min (ref >60)
Glucose, Bld: 125 mg/dL — ABNORMAL HIGH (ref 70–99)
Potassium: 3.9 mmol/L (ref 3.5–5.1)
Sodium: 136 mmol/L (ref 135–145)

## 2022-03-26 LAB — MAGNESIUM: Magnesium: 1.6 mg/dL — ABNORMAL LOW (ref 1.7–2.4)

## 2022-03-26 MED ORDER — PERAMPANEL 2 MG PO TABS
2.0000 mg | ORAL_TABLET | Freq: Every day | ORAL | Status: DC
  Administered 2022-03-27 – 2022-04-05 (×9): 2 mg
  Filled 2022-03-26 (×9): qty 1

## 2022-03-26 MED ORDER — VALPROATE SODIUM 100 MG/ML IV SOLN: 250.0000 mg | Freq: Two times a day (BID) | INTRAVENOUS | Status: DC

## 2022-03-26 MED ORDER — MAGNESIUM SULFATE 4 GM/100ML IV SOLN
4.0000 g | Freq: Once | INTRAVENOUS | Status: AC
  Administered 2022-03-26: 4 g via INTRAVENOUS
  Filled 2022-03-26: qty 100

## 2022-03-26 MED ORDER — VALPROATE SODIUM 100 MG/ML IV SOLN
500.0000 mg | Freq: Two times a day (BID) | INTRAVENOUS | Status: DC
  Filled 2022-03-26: qty 5

## 2022-03-26 MED ORDER — PERAMPANEL 8 MG PO TABS
8.0000 mg | ORAL_TABLET | Freq: Once | ORAL | Status: AC
  Administered 2022-03-26: 8 mg
  Filled 2022-03-26: qty 1

## 2022-03-26 NOTE — Progress Notes (Signed)
RT NOTE: RT attempted SBT on patient this AM however patient went apneic and backup ventilation alarmed.  Placed patient back on full support ventilator settings.  Currently tolerating well at this time.  RT will continue to monitor and attempt wean as tolerated. ?

## 2022-03-26 NOTE — Progress Notes (Signed)
EEG maintenance performed.  Impedance under 10 kOhms.  No skin breakdown observed at electrode sites Fp1, Fp2. ?

## 2022-03-26 NOTE — Progress Notes (Signed)
? ?NAME:  Frederick [name], MRN:  [id number], DOB:  04/25/1961, LOS: 6 ?ADMISSION DATE:  03/19/2022, CONSULTATION DATE:  03/20/22 ?REFERRING MD:  Dr. Derry LoryKhaliqdina CHIEF COMPLAINT:  Focal SE  ? ?History of Present Illness:  ?Patient is a 61 year old male with prior history of seizure, heavy alcohol abuse, asthma, and hypertension who presented on 4/14 with witnessed seizures.  Initially treated with versed with EMS which abated seizure.  On arrival to ER, patient had left hemiparesis therefore code stroke activated.  After CT, had more seizure activity requiring more ativan and keppra for cessation.   ?  ?Prior hospitalization in February for generalized tonic-clonic status epilepticus requiring intubation secondary to medication non-compliance.  Hospitalization complicated by small right cerebellum stroke.   ?  ?He was since admitted to [facility] with neurology following.  CIWA protocol active. On 4/15, patient developed focal status on left thought to be progressing to GTC despite more AED now with concern for airway.  PCCM consulted for transfer to ICU for intubation and pressure support. ? ?Pertinent  Medical History  ?Tobacco abuse, seizure, heavy alcohol abuse, asthma, hypertension, PE (not on AC), pulmonary TB (treated), CVA ?Significant Hospital Events: ?Including procedures, antibiotic start and stop dates in addition to other pertinent events   ?4/14 admitted [facility] r/o stroke, seizures ?4/15 PCCM consulted, focal status progressing-> intubation  ?04/16 versed gtt started and Perampanel given once.  ?04/17 phenobarbital 130 mg given once ?04/18 planned sedation wean by neurology ?Interim History / Subjective:  ? ?Eyes open, not following commands, moving upper extremities ? ?Review of Systems:   ?Negative unless stated in the subjective. ?Objective Bps elevated with 167/87 highest.   ?Blood pressure (!) 107/58, pulse 71, temperature (!) 97.5 ?F (36.4 ?C), temperature source Axillary, resp. rate 15, height 6\' 1"  (1.854 m),  weight 76.5 kg, SpO2 100 %. ?   ?Vent Mode: PRVC ?FiO2 (%):  [30 %] 30 % ?Set Rate:  [15 bmp] 15 bmp ?Vt Set:  [409[640 mL] 640 mL ?PEEP:  [5 cmH20] 5 cmH20 ?Pressure Support:  [10 cmH20] 10 cmH20 ?Plateau Pressure:  [15 cmH20-19 cmH20] 15 cmH20  ? ?Intake/Output Summary (Last 24 hours) at 03/26/2022 0820 ?Last data filed at 03/26/2022 0700 ?Gross per 24 hour  ?Intake 1388.25 ml  ?Output 2225 ml  ?Net -836.75 ml  ?2.6 L urine output and 3 stool occurrence.  ?Filed Weights  ? 03/23/22 0500 03/25/22 0500 03/26/22 0400  ?Weight: 76.5 kg 79.2 kg 75.4 kg  ? ?Examination: ?General: Middle-age, does not appear to be in distress, dry oral mucosa ?HENT: Endotracheal tube in place ?Lungs: Clear breath sounds anteriorly ?Cardiovascular: S1-S2 appreciated, no lower extremity edema ?Abdomen: Soft, bowel sounds appreciated ?Extremities: No clubbing, no edema ?Neuro: sedated, flexes to pain ?GU:  catheter in place.  ? ?Consults  ?Neurology following ?Resolved Hospital Problem list   ? ?Assessment & Plan:  ? ?Status epilepticus ?History of EtOH abuse and medical noncompliance ?History of seizures ?-Continues on Keppra 1 g twice daily ?-Appreciate neurology follow-up ?-No seizures on recent EEG, epileptogenic foci noted ?-Continue respiratory support ?-Neuroprotective measures ?-Neurochecks ? ?Acute respiratory failure related to above ?Presumed aspiration pneumonia ?Urinary tract infection ?-Continue mechanical ventilator support ?-VAP prevention protocol ?-Wean FiO2 ?-Not a candidate for extubation at present secondary to mental status ?  ?EtOH abuse ?-Continue to monitor ?-On phenobarb ?-Empiric thiamine/folate/MVI ? ?Hypertension ?-Continue Coreg ?-Monitor closely ? ?History of small right cerebellar stroke February 2023 ?Hyperlipidemia ?-On home Lipitor ?Electrolyte derangement including  ?hypomagnesemia ?Hypokalemia ?-Continue  to replete ? ?Normocytic anemia ?-Continue to monitor ?-Transfusion per  protocol ? ?Hypomagnesemia ?Hypokalemia ?- Mag 2.0 this am. was 4.2 this am. K 5.0. ?-Follow mag and K tomorrow.  ? ?Mental status appears slightly better, still not following commands ?This remains a barrier to extubation ?-Will wean as tolerated ? ?Best Practice (right click and "Reselect all SmartList Selections" daily)  ?Diet/type: tubefeeds ?DVT prophylaxis: LMWH ?GI prophylaxis: PPI ?Lines: N/A ?Foley:  N/A ?Continuous: None ?Code Status:  full code ?Last date of multidisciplinary goals of care discussion []  ?Labs   ?Labs reviewed ?CBC: ?Recent Labs  ?Lab 03/19/22 ?2035 03/19/22 ?2304 03/20/22 ?03/22/22 03/20/22 ?1721 03/21/22 ?0304 03/23/22 ?03/25/22 03/24/22 ?03/26/22 03/26/22 ?0121  ?WBC 8.6  --  6.6  --  8.0 7.8 7.6 9.7  ?NEUTROABS 5.7  --   --   --  4.8  --   --   --   ?HGB 12.1*   < > 10.1* 12.6* 11.8* 11.0* 10.6* 11.9*  ?HCT 40.6   < > 31.4* 37.0* 36.9* 34.2* 32.6* 36.4*  ?MCV 88.8  --  85.3  --  83.1 84.0 81.9 81.6  ?PLT 295  --  204  --  281 258 200 259  ? < > = values in this interval not displayed.  ? ?Basic Metabolic Panel: ?Recent Labs  ?Lab 03/20/22 ?2201 03/21/22 ?0304 03/21/22 ?03/23/22 03/22/22 ?0148 03/22/22 ?03/24/22 03/22/22 ?1610 03/23/22 ?03/25/22 03/24/22 ?03/26/22 03/25/22 ?0103 03/26/22 ?0121  ?NA 135 133*   < > 138   < > 143 142 141 136 136  ?K 4.5 3.1*   < > 3.8   < > 3.9 3.7 4.2 5.0 3.9  ?CL 103 101   < > 105   < > 110 111 111 106 104  ?CO2 21* 23   < > 26   < > 27 23 24 26 25   ?GLUCOSE 109* 121*   < > 104*   < > 109* 115* 120* 116* 125*  ?BUN 6* 6*   < > 11   < > 11 13 14 15 18   ?CREATININE 0.88 0.89   < > 1.29*   < > 1.07 1.02 0.90 0.80 0.75  ?CALCIUM 9.2 9.4   < > 9.0   < > 9.2 9.2 8.7* 9.1 9.5  ?MG  --  1.8  --  2.0  --   --  1.6* 1.6* 2.0 1.6*  ?PHOS 2.4* 2.6  --  3.3  --   --  3.7 4.5  --   --   ? < > = values in this interval not displayed.  ? ?GFR: ?Estimated Creatinine Clearance: 103.4 mL/min (by C-G formula based on SCr of 0.75 mg/dL). ?Recent Labs  ?Lab 03/21/22 ?0304 03/23/22 ?0242 03/24/22 ?0431  03/26/22 ?0121  ?WBC 8.0 7.8 7.6 9.7  ? ?Liver Function Tests: ?Recent Labs  ?Lab 03/19/22 ?2035 03/20/22 ?03/21/22  ?AST 18 12*  ?ALT 8 7  ?ALKPHOS 133* 94  ?BILITOT 0.3 0.3  ?PROT 7.9 5.5*  ?ALBUMIN 3.0* 2.1*  ? ?No results for input(s): LIPASE, AMYLASE in the last 168 hours. ?Recent Labs  ?Lab 03/20/22 ?03/22/22  ?AMMONIA 21  ? ?ABG ?   ?Component Value Date/Time  ? PHART 7.502 (H) 03/20/2022 1721  ? PCO2ART 30.1 (L) 03/20/2022 1721  ? PO2ART 61 (L) 03/20/2022 1721  ? HCO3 23.7 03/20/2022 1721  ? TCO2 25 03/20/2022 1721  ? O2SAT 94 03/20/2022 1721  ? ?  ?Coagulation Profile: ?No results for input(s): INR,  PROTIME in the last 168 hours. ?Cardiac Enzymes: ?No results for input(s): CKTOTAL, CKMB, CKMBINDEX, TROPONINI in the last 168 hours. ?HbA1C: ?Hgb A1c MFr Bld  ?Date/Time Value Ref Range Status  ?01/18/2022 09:48 AM 5.9 (H) 4.8 - 5.6 % Final  ?  Comment:  ?  (NOTE) ?Pre diabetes:          5.7%-6.4% ? ?Diabetes:              >6.4% ? ?Glycemic control for   <7.0% ?adults with diabetes ?  ?01/05/2022 10:02 PM 5.5 4.8 - 5.6 % Final  ?  Comment:  ?  (NOTE) ?Pre diabetes:          5.7%-6.4% ? ?Diabetes:              >6.4% ? ?Glycemic control for   <7.0% ?adults with diabetes ?  ? ?CBG: ?Recent Labs  ?Lab 03/25/22 ?1127 03/25/22 ?1935 03/25/22 ?2327 03/26/22 ?7408 03/26/22 ?1448  ?GLUCAP 120* 112* 114* 130* 100*  ? ?Past Medical History:  ?He,  has a past medical history of Arthritis, Asthma, Bilateral lower extremity edema (05/28/2021), Cough (05/28/2021), Dyspnea, Dyspnea (05/28/2021), EtOH dependence (HCC), Fibula fracture, Finger osteomyelitis, right (HCC) (05/26/2021), Hernia, Hypertension, Pulmonary embolism (HCC), Seizures (HCC), Streptococcal bacteremia (09/09/2021), and Tuberculosis.  ?Surgical History:  ? ?Past Surgical History:  ?Procedure Laterality Date  ? AMPUTATION Right 05/29/2021  ? Procedure: PARTIAL RIGHT 2ND FINGER AMPUTATION;  Surgeon: Kathryne Hitch, MD;  Location: [facility];   Service: Orthopedics;  Laterality: Right;  ? ANKLE SURGERY    ? INGUINAL HERNIA REPAIR Right 01/19/2013  ? Procedure: HERNIA REPAIR INGUINAL ADULT;  Surgeon: [name], MD,FACS;  Location: [facility] OR;  Service: General;  Laterality: Right;

## 2022-03-26 NOTE — Procedures (Signed)
Patient Name: Frederick [name]  ?MRN: [id number]  ?Epilepsy Attending: Charlsie Quest  ?Referring Physician/Provider: Erick Blinks, MD ?Duration: 03/25/2022 1242 to 03/26/2022 1242 ?  ?Patient history: 61 y.o. male with a history of encephalopathy who is undergoing an EEG to evaluate for seizures. ?  ?Level of alertness: lethargic ?  ?AEDs during EEG study: LEV, LCM, VPA, Phenobarb ?  ?Technical aspects: This EEG study was done with scalp electrodes positioned according to the 10-20 International system of electrode placement. Electrical activity was acquired at a sampling rate of 500Hz  and reviewed with a high frequency filter of 70Hz  and a low frequency filter of 1Hz . EEG data were recorded continuously and digitally stored.  ?  ?Description: EEG showed continuous generalized and lateralized right hemisphere 3-6hz  theta-delta slowing. Lateralized periodic discharges with overriding fast activity was noted in right hemisphere at 1-1.5Hz .  Seizures without clinical signs were noted arising from right hemisphere, maximal C4/P4, average 1 seizure every 2 hours, lasting about 1 to 1.5 minutes. During the seizure, EEG initially showed 1.5 to 2 Hz lateralized periodic discharges in right hemisphere which then involved all of right hemisphere as well as left posterior quadrant and showed 15 to 18 Hz beta activity.  Gradually the beta activity evolved into 4-5 Hz theta slowing followed by 2 to 3 Hz delta slowing. Last seizure was at 0928 on 03/26/2022.  Hyperventilation and photic stimulation were not performed.    ?  ?ABNORMALITY ?-Seizure without clinical signs, right hemisphere ?- Lateralized periodic discharges with overriding fast activity (LPD+F), right hemiphere ?- Continuous sow, generalized and lateralized right hemisphere  ?  ?IMPRESSION: ?This study showed seizures without clinical signs arising from right hemisphere, average 1 seizure every 2 hours, lasting 1-1.5 minutes. Additionally there is severe diffuse  encephalopathy, likely due to seizure, sedation.  ?  ?  ?Denim Start  ?

## 2022-03-26 NOTE — Progress Notes (Signed)
eLink Physician-Brief Progress Note ?Patient Name: Frederick [name] ?DOB: 1961/04/03 ?MRN: [id number] ? ? ?Date of Service ? 03/26/2022  ?HPI/Events of Note ? Mg++ 1.6  ?eICU Interventions ? Mg++ replaced per E-Link electrolyte replacement protocol.  ? ? ? ?  ? ?Frederick [name] ?03/26/2022, 4:03 AM ?

## 2022-03-26 NOTE — Progress Notes (Signed)
Subjective: NAEO. No clinical seizure ? ?ROS: Unable to obtain due to poor mental status ? ?Examination ? ?Vital signs in last 24 hours: ?Temp:  [98.5 ?F (36.9 ?C)-99.1 ?F (37.3 ?C)] 99.1 ?F (37.3 ?C) (04/21 1123) ?Pulse Rate:  [85-104] 102 (04/21 1157) ?Resp:  [12-19] 18 (04/21 1157) ?BP: (136-164)/(75-109) 139/83 (04/21 1157) ?SpO2:  [98 %-100 %] 100 % (04/21 1158) ?FiO2 (%):  [30 %] 30 % (04/21 1158) ?Weight:  [75.4 kg] 75.4 kg (04/21 0400) ? ?General: lying in bed, NAD ?RS: Intubated ?Neuro: Off sedation, awake but not following commands, does not track examiner in the room, pupils equal round and reactive to light, blinking, cough reflex intact, withdraws to noxious stimuli in all extremities ? ?Basic Metabolic Panel: ?Recent Labs  ?Lab 03/20/22 ?2201 03/21/22 ?0304 03/21/22 ?2563 03/22/22 ?0148 03/22/22 ?8937 03/22/22 ?1610 03/23/22 ?3428 03/24/22 ?7681 03/25/22 ?0103 03/26/22 ?0121  ?NA 135 133*   < > 138   < > 143 142 141 136 136  ?K 4.5 3.1*   < > 3.8   < > 3.9 3.7 4.2 5.0 3.9  ?CL 103 101   < > 105   < > 110 111 111 106 104  ?CO2 21* 23   < > 26   < > 27 23 24 26 25   ?GLUCOSE 109* 121*   < > 104*   < > 109* 115* 120* 116* 125*  ?BUN 6* 6*   < > 11   < > 11 13 14 15 18   ?CREATININE 0.88 0.89   < > 1.29*   < > 1.07 1.02 0.90 0.80 0.75  ?CALCIUM 9.2 9.4   < > 9.0   < > 9.2 9.2 8.7* 9.1 9.5  ?MG  --  1.8  --  2.0  --   --  1.6* 1.6* 2.0 1.6*  ?PHOS 2.4* 2.6  --  3.3  --   --  3.7 4.5  --   --   ? < > = values in this interval not displayed.  ? ? ?CBC: ?Recent Labs  ?Lab 03/19/22 ?2035 03/19/22 ?2304 03/20/22 ?03/21/22 03/20/22 ?1721 03/21/22 ?0304 03/23/22 ?03/23/22 03/24/22 ?6203 03/26/22 ?0121  ?WBC 8.6  --  6.6  --  8.0 7.8 7.6 9.7  ?NEUTROABS 5.7  --   --   --  4.8  --   --   --   ?HGB 12.1*   < > 10.1* 12.6* 11.8* 11.0* 10.6* 11.9*  ?HCT 40.6   < > 31.4* 37.0* 36.9* 34.2* 32.6* 36.4*  ?MCV 88.8  --  85.3  --  83.1 84.0 81.9 81.6  ?PLT 295  --  204  --  281 258 200 259  ? < > = values in this interval not  displayed.  ? ? ?Coagulation Studies: ?No results for input(s): LABPROT, INR in the last 72 hours. ? ?Imaging ?No new brain imaging overnight ?  ?ASSESSMENT AND PLAN:  61 year old male who presented with focal convulsive status epilepticus (left upper extremity twitching) in the setting of hypoglycemia.  EEG showed right hemispheric status epilepticus which has gradually improved. ?   ?Focal convulsive status epilepticus, resolved ?Acute encephalopathy, due to seizure and medication ?Chronic strokes ?Alcohol use disorder ?- LTM  showed seizures without clinical signs arising from right hemisphere, average 1 seizure every 2 hours, lasting 1-1.5 minutes. Additionally there is severe diffuse encephalopathy, likely due to seizure, sedation.  ?-Of note, One seizure lasting about 1 minute every 2 hours does not necessarily  cause the degree of encephalopathy we are seeing with the patient.  Coming out of his case average heart, seeg ?  ?Recommendations ?-Will order Perampanel 8 mg once and start 2 mg nightly  ?-Continue Vimpat 150 mg twice daily, Keppra 1500 mg twice daily, Depakote 500 mg every 6 hours ?-Continue phenobarbital 130 mg twice daily, will check level tomorrow ?-Depakote level was lower, it interacts with phenobarbital and can contribute to sedation.  Will discontinue Depakote for now to minimize sedation ?-Continue LTM EEG as patient was noted to have seizures overnight ?-Continue seizure precautions ?-Management of rest of comorbidities per primary team ?-Discussed plan with Dr. Wynona Neat ?   ?I have spent a total of  36  minutes with the patient reviewing hospital notes,  test results, labs and examining the patient as well as establishing an assessment and plan.  > 50% of time was spent in direct patient care.  ? ?Lindie Spruce ?Epilepsy ?[facility] ?For questions after 5pm please refer to AMION to reach the Neurologist on call ? ?

## 2022-03-27 ENCOUNTER — Encounter (HOSPITAL_COMMUNITY): Payer: Medicare Other

## 2022-03-27 DIAGNOSIS — R569 Unspecified convulsions: Secondary | ICD-10-CM | POA: Diagnosis not present

## 2022-03-27 LAB — GLUCOSE, CAPILLARY
Glucose-Capillary: 106 mg/dL — ABNORMAL HIGH (ref 70–99)
Glucose-Capillary: 121 mg/dL — ABNORMAL HIGH (ref 70–99)
Glucose-Capillary: 126 mg/dL — ABNORMAL HIGH (ref 70–99)
Glucose-Capillary: 127 mg/dL — ABNORMAL HIGH (ref 70–99)
Glucose-Capillary: 133 mg/dL — ABNORMAL HIGH (ref 70–99)
Glucose-Capillary: 61 mg/dL — ABNORMAL LOW (ref 70–99)
Glucose-Capillary: 89 mg/dL (ref 70–99)

## 2022-03-27 LAB — PHENOBARBITAL LEVEL: Phenobarbital: 16.6 ug/mL (ref 15.0–30.0)

## 2022-03-27 MED ORDER — DEXTROSE 50 % IV SOLN
INTRAVENOUS | Status: AC
  Administered 2022-03-27: 25 mL
  Filled 2022-03-27: qty 50

## 2022-03-27 NOTE — Progress Notes (Signed)
LTM maint complete - skin breakdown under F7-nurse informed, no new breakdown under Fp1 P2; applied new leads , [facility] monitored, Event button test confirmed by [facility]. ?  ? ?

## 2022-03-27 NOTE — Progress Notes (Signed)
Re-application of EEG leads, the leads came off during personal care. ?

## 2022-03-27 NOTE — Procedures (Addendum)
Patient Name: Frederick [name]  ?MRN: [id number]  ?Epilepsy Attending: Charlsie Quest  ?Referring Physician/Provider: Erick Blinks, MD ?Duration: 03/26/2022 1242 to 03/27/2022 1242 ?  ?Patient history: 61 y.o. male with a history of encephalopathy who is undergoing an EEG to evaluate for seizures. ?  ?Level of alertness: lethargic, asleep ?  ?AEDs during EEG study: LEV, LCM, Phenobarb, Perampanel ?  ?Technical aspects: This EEG study was done with scalp electrodes positioned according to the 10-20 International system of electrode placement. Electrical activity was acquired at a sampling rate of 500Hz  and reviewed with a high frequency filter of 70Hz  and a low frequency filter of 1Hz . EEG data were recorded continuously and digitally stored.  ?  ?Description: During awake state, EEG showed continuous generalized and lateralized right hemisphere 3-6hz  theta-delta slowing. Sleep was characterized by sleep spindles (12-14hz ), maximal frontocentral region./  Hyperventilation and photic stimulation were not performed.    ?  ?ABNORMALITY ?- Continuous sow, generalized and lateralized right hemisphere  ?  ?IMPRESSION: ?This study is suggestive of  cortical dysfunction arising from right hemisphere likely due to post-ictal state as well as severe diffuse encephalopathy, non specific etiology. No seizures were seen during this study. ? ?EEG appears to be improving compared to previous day.  ?   ?  ?

## 2022-03-27 NOTE — Progress Notes (Signed)
? ?NAME:  Frederick [name], MRN:  [id number], DOB:  16-Nov-1961, LOS: 7 ?ADMISSION DATE:  03/19/2022, CONSULTATION DATE:  03/20/22 ?REFERRING MD:  Dr. Lorrin Goodell CHIEF COMPLAINT:  Focal SE  ? ?History of Present Illness:  ?Patient is a 61 year old male with prior history of seizure, heavy alcohol abuse, asthma, and hypertension who presented on 4/14 with witnessed seizures.  Initially treated with versed with EMS which abated seizure.  On arrival to ER, patient had left hemiparesis therefore code stroke activated.  After CT, had more seizure activity requiring more ativan and keppra for cessation.   ?  ?Prior hospitalization in February for generalized tonic-clonic status epilepticus requiring intubation secondary to medication non-compliance.  Hospitalization complicated by small right cerebellum stroke.   ?  ?He was since admitted to [facility] with neurology following.  CIWA protocol active. On 4/15, patient developed focal status on left thought to be progressing to [place] despite more AED now with concern for airway.  PCCM consulted for transfer to ICU for intubation and pressure support. ? ?Pertinent  Medical History  ?Tobacco abuse, seizure, heavy alcohol abuse, asthma, hypertension, PE (not on AC), pulmonary TB (treated), CVA ?Significant Hospital Events: ?Including procedures, antibiotic start and stop dates in addition to other pertinent events   ?4/14 admitted [place] r/o stroke, seizures ?4/15 PCCM consulted, focal status progressing-> intubation  ?04/16 versed gtt started and Perampanel given once.  ?04/17 phenobarbital 130 mg given once ?04/18 planned sedation wean by neurology ?Interim History / Subjective:  ? ?No overnight events ?Was still having seizures 4/21 2023 ? ?Review of Systems:   ?Negative unless stated in the subjective. ?Objective Bps elevated with 167/87 highest.   ?Blood pressure (!) 107/58, pulse 71, temperature (!) 97.5 ?F (36.4 ?C), temperature source Axillary, resp. rate 15, height 6\' 1"  (1.854 m),  weight 76.5 kg, SpO2 100 %. ?   ?Vent Mode: PRVC ?FiO2 (%):  [30 %] 30 % ?Set Rate:  [15 bmp] 15 bmp ?Vt Set:  [id number] mL] 640 mL ?PEEP:  [5 cmH20] 5 cmH20 ?Plateau Pressure:  [15 cmH20-20 cmH20] 19 cmH20  ? ?Intake/Output Summary (Last 24 hours) at 03/27/2022 0756 ?Last data filed at 03/27/2022 0600 ?Gross per 24 hour  ?Intake 2592.31 ml  ?Output 1825 ml  ?Net 767.31 ml  ?2.6 L urine output and 3 stool occurrence.  ?Filed Weights  ? 03/25/22 0500 03/26/22 0400 03/27/22 0146  ?Weight: 79.2 kg 75.4 kg 76.6 kg  ? ?Examination: ?General: Middle-age, does not appear to be in distress, dry oral mucosa ?HEENT: Endotracheal tube in place ?Lungs: Clear breath sounds anteriorly ?Cardiovascular: S1-S2 appreciated, no lower extremity edema ?Abdomen: Soft, bowel sounds appreciated ?Extremities: No clubbing, no edema ?Neuro: sedated, flexes to pain ?GU:  catheter in place.  ? ?Consults  ?Neurology following ?Resolved Hospital Problem list   ? ?Assessment & Plan:  ? ?Status epilepticus ?History of EtOH abuse and medical noncompliance ?History of seizures ?-On Keppra 1 g twice daily ?-Perampanel 2 mg ?-Phenobarbital ?-Vimpat ?-Appreciate neurology follow-up ?-Still with seizures 4/21 ?-Continue neuroprotective measures, ?-Neurochecks ? ?Acute respiratory failure related to status epilepticus ?Presumed aspiration pneumonia ?Urinary tract infection ?-Continue mechanical ventilator support ?-VAP prevention protocol ?-Wean FiO2 as tolerated ?-Weaning as tolerated ?-Tolerating pressure support but mental status precludes extubation at present ? ?EtOH abuse ?-On phenobarb ?-Empiric thiamine/folate/MVI ? ?Hypertension ?-Continue Coreg ?-Continue to monitor closely ?  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 ?Electrolyte derangement ?-Repleted ?-We will check labs 4/23 ? ?Normocytic  anemia ?-Continue to monitor ?-Transfusion per protocol ? ?Mental status precludes extubation ?He is  tolerating pressure support ?Seizures were still uncontrolled 24 hours ago ? ?Best Practice (right click and "Reselect all SmartList Selections" daily)  ?Diet/type: tubefeeds ?DVT prophylaxis: LMWH ?GI prophylaxis: PPI ?Lines: N/A ?Foley:  N/A ?Continuous: None ?Code Status:  full code ?Last date of multidisciplinary goals of care discussion []  ?Labs   ?Labs reviewed ?CBC: ?Recent Labs  ?Lab 03/20/22 ?1721 03/21/22 ?0304 03/23/22 ?0242 03/24/22 ?0431 03/26/22 ?0121  ?WBC  --  8.0 7.8 7.6 9.7  ?NEUTROABS  --  4.8  --   --   --   ?HGB 12.6* 11.8* 11.0* 10.6* 11.9*  ?HCT 37.0* 36.9* 34.2* 32.6* 36.4*  ?MCV  --  83.1 84.0 81.9 81.6  ?PLT  --  281 258 200 259  ? ?Basic Metabolic Panel: ?Recent Labs  ?Lab 03/20/22 ?2201 03/21/22 ?0304 03/21/22 ?[id number] 03/22/22 ?0148 03/22/22 ?[id number] 03/22/22 ?1610 03/23/22 ?[id number] 03/24/22 ?[id number] 03/25/22 ?0103 03/26/22 ?0121  ?NA 135 133*   < > 138   < > 143 142 141 136 136  ?K 4.5 3.1*   < > 3.8   < > 3.9 3.7 4.2 5.0 3.9  ?CL 103 101   < > 105   < > 110 111 111 106 104  ?CO2 21* 23   < > 26   < > 27 23 24 26 25   ?GLUCOSE 109* 121*   < > 104*   < > 109* 115* 120* 116* 125*  ?BUN 6* 6*   < > 11   < > 11 13 14 15 18   ?CREATININE 0.88 0.89   < > 1.29*   < > 1.07 1.02 0.90 0.80 0.75  ?CALCIUM 9.2 9.4   < > 9.0   < > 9.2 9.2 8.7* 9.1 9.5  ?MG  --  1.8  --  2.0  --   --  1.6* 1.6* 2.0 1.6*  ?PHOS 2.4* 2.6  --  3.3  --   --  3.7 4.5  --   --   ? < > = values in this interval not displayed.  ? ?GFR: ?Estimated Creatinine Clearance: 105.1 mL/min (by C-G formula based on SCr of 0.75 mg/dL). ?Recent Labs  ?Lab 03/21/22 ?0304 03/23/22 ?0242 03/24/22 ?0431 03/26/22 ?0121  ?WBC 8.0 7.8 7.6 9.7  ? ?Liver Function Tests: ?No results for input(s): AST, ALT, ALKPHOS, BILITOT, PROT, ALBUMIN in the last 168 hours. ? ?No results for input(s): LIPASE, AMYLASE in the last 168 hours. ?No results for input(s): AMMONIA in the last 168 hours. ? ?ABG ?   ?Component Value Date/Time  ? PHART 7.502 (H) 03/20/2022 1721  ?  PCO2ART 30.1 (L) 03/20/2022 1721  ? PO2ART 61 (L) 03/20/2022 1721  ? HCO3 23.7 03/20/2022 1721  ? TCO2 25 03/20/2022 1721  ? O2SAT 94 03/20/2022 1721  ? ?  ?Coagulation Profile: ?No results for input(s): INR, PROTIME in the last 168 hours. ?Cardiac Enzymes: ?No results for input(s): CKTOTAL, CKMB, CKMBINDEX, TROPONINI in the last 168 hours. ?HbA1C: ?Hgb A1c MFr Bld  ?Date/Time Value Ref Range Status  ?01/18/2022 09:48 AM 5.9 (H) 4.8 - 5.6 % Final  ?  Comment:  ?  (NOTE) ?Pre diabetes:          5.7%-6.4% ? ?Diabetes:              >6.4% ? ?Glycemic control for   <7.0% ?adults with diabetes ?  ?01/05/2022 10:02 PM 5.5 4.8 -  5.6 % Final  ?  Comment:  ?  (NOTE) ?Pre diabetes:          5.7%-6.4% ? ?Diabetes:              >6.4% ? ?Glycemic control for   <7.0% ?adults with diabetes ?  ? ?CBG: ?Recent Labs  ?Lab 03/26/22 ?1513 03/26/22 ?1928 03/26/22 ?2312 03/27/22 ?0328 03/27/22 ?[id number]  ?GLUCAP 104* 98 117* 127* 121*  ? ?Past Medical History:  ?He,  has a past medical history of Arthritis, Asthma, Bilateral lower extremity edema (05/28/2021), Cough (05/28/2021), Dyspnea, Dyspnea (05/28/2021), EtOH dependence ([place]), Fibula fracture, Finger osteomyelitis, right ([place]) (05/26/2021), Hernia, Hypertension, Pulmonary embolism ([place]), Seizures ([place]), Streptococcal bacteremia (09/09/2021), and Tuberculosis.  ?Surgical History:  ? ?Past Surgical History:  ?Procedure Laterality Date  ? AMPUTATION Right 05/29/2021  ? Procedure: PARTIAL RIGHT 2ND FINGER AMPUTATION;  Surgeon: Mcarthur Rossetti, MD;  Location: [facility];  Service: Orthopedics;  Laterality: Right;  ? ANKLE SURGERY    ? INGUINAL HERNIA REPAIR Right 01/19/2013  ? Procedure: HERNIA REPAIR INGUINAL ADULT;  Surgeon: Gayland Curry, MD,FACS;  Location: [place];  Service: General;  Laterality: Right;  ? TEE WITHOUT CARDIOVERSION N/A 08/14/2021  ? Procedure: TRANSESOPHAGEAL ECHOCARDIOGRAM (TEE);  Surgeon: Josue Hector, MD;  [facility] ENDOSCOPY;  Service:  Cardiovascular;  Laterality: N/A;  ? TIBIA IM NAIL INSERTION Left 01/22/2013  ? Procedure: INTRAMEDULLARY (IM) NAIL TIBIAL;  Surgeon: Johnny Bridge, MD;  Location: [place];  Service: Orthopedics;  Laterality: Left;  ?

## 2022-03-27 NOTE — Progress Notes (Signed)
Neurology progress note ? ?Subjective: NAEO. No clinical seizure ? ?ROS: Unable to obtain due to poor mental status ? ?Examination ? ?Vital signs in last 24 hours: ?Temp:  [97.4 ?F (36.3 ?C)-99.7 ?F (37.6 ?C)] 98.2 ?F (36.8 ?C) (04/22 0734) ?Pulse Rate:  [93-119] 111 (04/22 0801) ?Resp:  [10-27] 20 (04/22 0801) ?BP: (108-154)/(71-114) 126/76 (04/22 0801) ?SpO2:  [98 %-100 %] 100 % (04/22 0802) ?FiO2 (%):  [30 %] 30 % (04/22 0802) ?Weight:  [76.6 kg] 76.6 kg (04/22 0146) ? ?General: lying in bed, NAD ?RS: Intubated ?Neuro: Off sedation, awake but not following commands, does not track examiner in the room, pupils equal round and reactive to light, blinking, cough reflex intact, withdraws to noxious stimuli in all extremities ?Completely unchanged exam ? ?Basic Metabolic Panel: ?Recent Labs  ?Lab 03/20/22 ?2201 03/21/22 ?0304 03/21/22 ?8127 03/22/22 ?0148 03/22/22 ?5170 03/22/22 ?1610 03/23/22 ?0174 03/24/22 ?9449 03/25/22 ?0103 03/26/22 ?0121  ?NA 135 133*   < > 138   < > 143 142 141 136 136  ?K 4.5 3.1*   < > 3.8   < > 3.9 3.7 4.2 5.0 3.9  ?CL 103 101   < > 105   < > 110 111 111 106 104  ?CO2 21* 23   < > 26   < > 27 23 24 26 25   ?GLUCOSE 109* 121*   < > 104*   < > 109* 115* 120* 116* 125*  ?BUN 6* 6*   < > 11   < > 11 13 14 15 18   ?CREATININE 0.88 0.89   < > 1.29*   < > 1.07 1.02 0.90 0.80 0.75  ?CALCIUM 9.2 9.4   < > 9.0   < > 9.2 9.2 8.7* 9.1 9.5  ?MG  --  1.8  --  2.0  --   --  1.6* 1.6* 2.0 1.6*  ?PHOS 2.4* 2.6  --  3.3  --   --  3.7 4.5  --   --   ? < > = values in this interval not displayed.  ? ? ? ?CBC: ?Recent Labs  ?Lab 03/20/22 ?1721 03/21/22 ?0304 03/23/22 ?0242 03/24/22 ?0431 03/26/22 ?0121  ?WBC  --  8.0 7.8 7.6 9.7  ?NEUTROABS  --  4.8  --   --   --   ?HGB 12.6* 11.8* 11.0* 10.6* 11.9*  ?HCT 37.0* 36.9* 34.2* 32.6* 36.4*  ?MCV  --  83.1 84.0 81.9 81.6  ?PLT  --  281 258 200 259  ? ?Phenobarbital level 16.6 ? ? ?Imaging ?No new brain imaging overnight ?  ?ASSESSMENT AND PLAN:  61 year old male who  presented with focal convulsive status epilepticus (left upper extremity twitching) in the setting of hypoglycemia.  EEG showed right hemispheric status epilepticus which has gradually improved. ?   ?Focal convulsive status epilepticus, resolved ?Acute encephalopathy, due to seizure and medication ?Chronic strokes ?Alcohol use disorder ?- LTM  showed seizures without clinical signs arising from right hemisphere, average 1 seizure every 2 hours, lasting 1-1.5 minutes. Additionally there is severe diffuse encephalopathy, likely due to seizure, sedation.  ?-Of note, One seizure lasting about 1 minute every 2 hours does not necessarily cause the degree of encephalopathy we are seeing with the patient.  ?  ?Recommendations ?-Started on perampanel yesterday.  Continue perampanel 2 mg nightly. ?-Continue Vimpat 150 mg twice daily, Keppra 1500 mg twice daily, Depakote 500 mg every 6 hours ?-Continue phenobarbital 130 mg twice daily, level therapeutic today. ?-Depakote was  discontinued Friday ?-Overnight LTM read pending.  Will discontinue LTM 124 hours seizure-free. ?-Continue seizure precautions ?-Management of rest of comorbidities per primary team ?-Discussed plan with Dr. Wynona Neat ? ?Addendum ?Overnight EEG negative for seizures ?We will leave on EEG for 1 more night as he was just loaded with perampanel yesterday.  Want to make sure that he is on maintenance meds and no seizures seen. ?Once LTM was discontinued, will need MRI of the brain for further evaluation as well. ?Plan was discussed with Dr. Wynona Neat and Dr. Melynda Ripple. ?   ?-- ?Milon Dikes, MD ?Neurologist ?[facility] ?Pager: [phone] ? ?CRITICAL CARE ATTESTATION ?Performed by: Milon Dikes, MD ?Total critical care time: 33 minutes ?Critical care time was exclusive of separately billable procedures and treating other patients and/or supervising APPs/Residents/Students ?Critical care was necessary to treat or prevent imminent or life-threatening  deterioration due to toxic metabolic encephalopathy, focal status epilepticus ?This patient is critically ill and at significant risk for neurological worsening and/or death and care requires constant monitoring. ?Critical care was time spent personally by me on the following activities: development of treatment plan with patient and/or surrogate as well as nursing, discussions with consultants, evaluation of patient's response to treatment, examination of patient, obtaining history from patient or surrogate, ordering and performing treatments and interventions, ordering and review of laboratory studies, ordering and review of radiographic studies, pulse oximetry, re-evaluation of patient's condition, participation in multidisciplinary rounds and medical decision making of high complexity in the care of this patient. ? ?

## 2022-03-28 DIAGNOSIS — R569 Unspecified convulsions: Secondary | ICD-10-CM | POA: Diagnosis not present

## 2022-03-28 LAB — GLUCOSE, CAPILLARY
Glucose-Capillary: 84 mg/dL (ref 70–99)
Glucose-Capillary: 79 mg/dL (ref 70–99)
Glucose-Capillary: 90 mg/dL (ref 70–99)
Glucose-Capillary: 78 mg/dL (ref 70–99)
Glucose-Capillary: 78 mg/dL (ref 70–99)
Glucose-Capillary: 86 mg/dL (ref 70–99)

## 2022-03-28 LAB — BASIC METABOLIC PANEL
Anion gap: 9 (ref 5–15)
BUN: 26 mg/dL — ABNORMAL HIGH (ref 8–23)
CO2: 25 mmol/L (ref 22–32)
Calcium: 9.4 mg/dL (ref 8.9–10.3)
Chloride: 99 mmol/L (ref 98–111)
Creatinine, Ser: 0.71 mg/dL (ref 0.61–1.24)
GFR, Estimated: >60 mL/min (ref >60)
Glucose, Bld: 117 mg/dL — ABNORMAL HIGH (ref 70–99)
Potassium: 4.5 mmol/L (ref 3.5–5.1)
Sodium: 133 mmol/L — ABNORMAL LOW (ref 135–145)

## 2022-03-28 LAB — CBC
HCT: 34.7 % — ABNORMAL LOW (ref 39.0–52.0)
Hemoglobin: 11.3 g/dL — ABNORMAL LOW (ref 13.0–17.0)
MCH: 26.9 pg (ref 26.0–34.0)
MCHC: 32.6 g/dL (ref 30.0–36.0)
MCV: 82.6 fL (ref 80.0–100.0)
Platelets: 257 10*3/uL (ref 150–400)
RBC: 4.2 MIL/uL — ABNORMAL LOW (ref 4.22–5.81)
RDW: 16.6 % — ABNORMAL HIGH (ref 11.5–15.5)
WBC: 9.6 10*3/uL (ref 4.0–10.5)
nRBC: 0 % (ref 0.0–0.2)

## 2022-03-28 LAB — MAGNESIUM: Magnesium: 1.7 mg/dL (ref 1.7–2.4)

## 2022-03-28 MED ORDER — MAGNESIUM SULFATE 2 GM/50ML IV SOLN
2.0000 g | Freq: Once | INTRAVENOUS | Status: AC
  Administered 2022-03-28: 2 g via INTRAVENOUS
  Filled 2022-03-28: qty 50

## 2022-03-28 MED ORDER — DEXMEDETOMIDINE HCL IN NACL 400 MCG/100ML IV SOLN
0.4000 ug/kg/h | INTRAVENOUS | Status: DC
  Administered 2022-03-28: 0.4 ug/kg/h via INTRAVENOUS
  Filled 2022-03-28 (×2): qty 100

## 2022-03-28 NOTE — Progress Notes (Signed)
? ?NAME:  Frederick [name], MRN:  [id number], DOB:  Oct 19, 1961, LOS: 8 ?ADMISSION DATE:  03/19/2022, CONSULTATION DATE:  03/20/22 ?REFERRING MD:  Dr. Derry Lory CHIEF COMPLAINT:  Focal SE  ? ?History of Present Illness:  ?Patient is a 61 year old male with prior history of seizure, heavy alcohol abuse, asthma, and hypertension who presented on 4/14 with witnessed seizures.  Initially treated with versed with EMS which abated seizure.  On arrival to ER, patient had left hemiparesis therefore code stroke activated.  After CT, had more seizure activity requiring more ativan and keppra for cessation.   ?  ?Prior hospitalization in February for generalized tonic-clonic status epilepticus requiring intubation secondary to medication non-compliance.  Hospitalization complicated by small right cerebellum stroke.   ?  ?He was since admitted to [facility] with neurology following.  CIWA protocol active. On 4/15, patient developed focal status on left thought to be progressing to [place] despite more AED now with concern for airway.  PCCM consulted for transfer to ICU for intubation and pressure support. ? ?Pertinent  Medical History  ?Tobacco abuse, seizure, heavy alcohol abuse, asthma, hypertension, PE (not on AC), pulmonary TB (treated), CVA ?Significant Hospital Events: ?Including procedures, antibiotic start and stop dates in addition to other pertinent events   ?4/14 admitted [facility] r/o stroke, seizures ?4/15 PCCM consulted, focal status progressing-> intubation  ?04/16 versed gtt started and Perampanel given once.  ?04/17 phenobarbital 130 mg given once ?04/18 planned sedation wean by neurology ?Interim History / Subjective:  ? ?No overnight events ?No seizure on EEG yesterday-last seizure was 9:30 AM on 03/26/2022 ? ?Review of Systems:   ?Negative unless stated in the subjective. ?Objective Bps elevated with 167/87 highest.   ?Blood pressure (!) 107/58, pulse 71, temperature (!) 97.5 ?F (36.4 ?C), temperature source Axillary, resp. rate  15, height 6\' 1"  (1.854 m), weight 76.5 kg, SpO2 100 %. ?   ?Vent Mode: PRVC ?FiO2 (%):  [30 %] 30 % ?Set Rate:  [15 bmp] 15 bmp ?Vt Set:  mL] 640 mL ?PEEP:  [5 cmH20] 5 cmH20 ?Pressure Support:  [10 cmH20] 10 cmH20 ?Plateau Pressure:  [14 cmH20-16 cmH20] 16 cmH20  ? ?Intake/Output Summary (Last 24 hours) at 03/28/2022 0738 ?Last data filed at 03/28/2022 0700 ?Gross per 24 hour  ?Intake 2629.79 ml  ?Output 1435 ml  ?Net 1194.79 ml  ?2.6 L urine output and 3 stool occurrence.  ?Filed Weights  ? 03/26/22 0400 03/27/22 0146 03/28/22 0223  ?Weight: 75.4 kg 76.6 kg 76.2 kg  ? ?Examination: ?General: Middle-age, does not appear to be in distress  ?HEENT: Endotracheal tube in place ?Lungs: Clear to auscultation anteriorly ?Cardiovascular: S1-S2 appreciated, no lower extremity edema ?Abdomen: Soft, bowel sounds appreciated ?Extremities: No finger clubbing ?Neuro: More arousable, appears to look around but not following commands ?GU:  catheter in place.  ? ?Consults  ?Neurology following ?Resolved Hospital Problem list   ? ?Assessment & Plan:  ? ?Status epilepticus ?History of EtOH abuse and medical noncompliance ?History of seizures ?-On Keppra 1 g twice daily ?-Perampanel 2 mg ?-Phenobarbital ?-Vimpat ? ?-Appreciate neurology follow-up ? ?Acute respiratory failure related to status epilepticus ?Presumed aspiration pneumonia ?Urinary tract infection ?-Continue mechanical ventilator support ?-VAP prevention protocol ?-Wean FiO2 as tolerated ?-Mental status precludes extubation but weaning ? ?History of EtOH abuse ?-On phenobarb ?-Empiric thiamine/folate/MVI ? ?Hypertension ?-Continue Coreg ?-Continue to monitor closely ? 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 ?Electrolyte derangement ?Mild hyponatremia ?-Repleted ?-We will check  labs 4/23 ? ?Normocytic anemia ?-Continue to monitor ?-Transfusion per protocol ? ?Mental status precludes extubation ?He is tolerating  pressure support ?Seizures controlled but mental status still poor ? ?Best Practice (right click and "Reselect all SmartList Selections" daily)  ?Diet/type: tubefeeds ?DVT prophylaxis: LMWH ?GI prophylaxis: PPI ?Lines: N/A ?Foley:  N/A ?Continuous: None ?Code Status:  full code ?Last date of multidisciplinary goals of care discussion []  ?Labs   ?Labs reviewed ?CBC: ?Recent Labs  ?Lab 03/23/22 ?0242 03/24/22 ?0431 03/26/22 ?0121 03/28/22 ?0224  ?WBC 7.8 7.6 9.7 9.6  ?HGB 11.0* 10.6* 11.9* 11.3*  ?HCT 34.2* 32.6* 36.4* 34.7*  ?MCV 84.0 81.9 81.6 82.6  ?PLT 258 200 259 257  ? ?Basic Metabolic Panel: ?Recent Labs  ?Lab 03/22/22 ?0148 03/22/22 ?03/24/22 03/23/22 ?03/25/22 03/24/22 ?03/26/22 03/25/22 ?0103 03/26/22 ?0121 03/28/22 ?03/30/22  ?NA 138   < > 142 141 136 136 133*  ?K 3.8   < > 3.7 4.2 5.0 3.9 4.5  ?CL 105   < > 111 111 106 104 99  ?CO2 26   < > 23 24 26 25 25   ?GLUCOSE 104*   < > 115* 120* 116* 125* 117*  ?BUN 11   < > 13 14 15 18  26*  ?CREATININE 1.29*   < > 1.02 0.90 0.80 0.75 0.71  ?CALCIUM 9.0   < > 9.2 8.7* 9.1 9.5 9.4  ?MG 2.0  --  1.6* 1.6* 2.0 1.6* 1.7  ?PHOS 3.3  --  3.7 4.5  --   --   --   ? < > = values in this interval not displayed.  ? ?GFR: ?Estimated Creatinine Clearance: 104.5 mL/min (by C-G formula based on SCr of 0.71 mg/dL). ?Recent Labs  ?Lab 03/23/22 ?0242 03/24/22 ?0431 03/26/22 ?0121 03/28/22 ?0224  ?WBC 7.8 7.6 9.7 9.6  ? ?Liver Function Tests: ?No results for input(s): AST, ALT, ALKPHOS, BILITOT, PROT, ALBUMIN in the last 168 hours. ? ?No results for input(s): LIPASE, AMYLASE in the last 168 hours. ?No results for input(s): AMMONIA in the last 168 hours. ? ?ABG ?   ?Component Value Date/Time  ? PHART 7.502 (H) 03/20/2022 1721  ? PCO2ART 30.1 (L) 03/20/2022 1721  ? PO2ART 61 (L) 03/20/2022 1721  ? HCO3 23.7 03/20/2022 1721  ? TCO2 25 03/20/2022 1721  ? O2SAT 94 03/20/2022 1721  ? ?  ?Coagulation Profile: ?No results for input(s): INR, PROTIME in the last 168 hours. ?Cardiac Enzymes: ?No results for  input(s): CKTOTAL, CKMB, CKMBINDEX, TROPONINI in the last 168 hours. ?HbA1C: ?Hgb A1c MFr Bld  ?Date/Time Value Ref Range Status  ?01/18/2022 09:48 AM 5.9 (H) 4.8 - 5.6 % Final  ?  Comment:  ?  (NOTE) ?Pre diabetes:          5.7%-6.4% ? ?Diabetes:              >6.4% ? ?Glycemic control for   <7.0% ?adults with diabetes ?  ?01/05/2022 10:02 PM 5.5 4.8 - 5.6 % Final  ?  Comment:  ?  (NOTE) ?Pre diabetes:          5.7%-6.4% ? ?Diabetes:              >6.4% ? ?Glycemic control for   <7.0% ?adults with diabetes ?  ? ?CBG: ?Recent Labs  ?Lab 03/27/22 ?1510 03/27/22 ?1916 03/27/22 ?2334 03/27/22 ?2355 03/28/22 ?2335  ?GLUCAP 126* 106* 61* 89 84  ? ?Past Medical History:  ?He,  has a past medical history of Arthritis, Asthma, Bilateral  lower extremity edema (05/28/2021), Cough (05/28/2021), Dyspnea, Dyspnea (05/28/2021), EtOH dependence ([facility]), Fibula fracture, Finger osteomyelitis, right ([facility]) (05/26/2021), Hernia, Hypertension, Pulmonary embolism ([facility]), Seizures ([facility]), Streptococcal bacteremia (09/09/2021), and Tuberculosis.  ?Surgical History:  ? ?Past Surgical History:  ?Procedure Laterality Date  ? AMPUTATION Right 05/29/2021  ? Procedure: PARTIAL RIGHT 2ND FINGER AMPUTATION;  Surgeon: Kathryne HitchBlackman, Christopher Y, MD;  [facility];  Service: Orthopedics;  Laterality: Right;  ? ANKLE SURGERY    ? INGUINAL HERNIA REPAIR Right 01/19/2013  ? Procedure: HERNIA REPAIR INGUINAL ADULT;  Surgeon: Atilano InaEric M Wilson, MD,FACS;  Location: [facility] OR;  Service: General;  Laterality: Right;  ? TEE WITHOUT CARDIOVERSION N/A 08/14/2021  ? Procedure: TRANSESOPHAGEAL ECHOCARDIOGRAM (TEE);  Surgeon: Wendall StadeNishan, Peter C, MD;  [facility];  Service: Cardiovascular;  Laterality: N/A;  ? TIBIA IM NAIL INSERTION Left 01/22/2013  ? Procedure: INTRAMEDULLARY (IM) NAIL TIBIAL;  Surgeon: Eulas PostJoshua P Landau, MD;  Location: [facility] OR;  Service: Orthopedics;  Laterality: Left;  ?  ?Social History:  ? reports that he has been smoking cigarettes.  He has been smoking an average of .5 packs per day. He has never used smokeless tobacco. He reports current alcohol use. He reports that he does not use drugs.  ?Family History:  ?His family history include

## 2022-03-28 NOTE — Progress Notes (Addendum)
eLink Physician-Brief Progress Note ?Patient Name: Frederick [name] ?DOB: 01/22/1961 ?MRN: [id number] ? ? ?Date of Service ? 03/28/2022  ?HPI/Events of Note ? Intubated patient who gets agitated at times. Request for restraints  ?eICU Interventions ? 24 hr wrist restraints ordered  ? ? ? ?Intervention Category ?Major Interventions: Respiratory failure - evaluation and management ? ?Kraig Genis G Shanelle Clontz ?03/28/2022, 7:40 PM ? ?Addendum at 8:20 pm  ?RN informed me that she has had to give 350 microgram fentanyl as divided pushes since she got in for the shift ?Patient wakes up, remains confused but gets agitated and only PRN fentanyl is ordered ?HR and BP are fine with no pressors on board ?Will add precedex which hopefully will help. D/w RN  ?

## 2022-03-28 NOTE — Progress Notes (Signed)
Neurology progress note ? ?Subjective: NAEO. No clinical seizure.  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  No seizures were seen during the study ?Seizure was around 9:30 AM on 03/26/2022.  He is remained seizure-free for 48 hours. ? ? ?ROS: Unable to obtain due to poor mental status ? ?Examination ? ?Vital signs in last 24 hours: ?Temp:  [97.5 ?F (36.4 ?C)-99.1 ?F (37.3 ?C)] 97.7 ?F (36.5 ?C) (04/23 7902) ?Pulse Rate:  [83-107] 86 (04/23 0804) ?Resp:  [11-20] 15 (04/23 0700) ?BP: (100-160)/(61-86) 137/74 (04/23 0804) ?SpO2:  [92 %-100 %] 100 % (04/23 0751) ?FiO2 (%):  [30 %] 30 % (04/23 0751) ?Weight:  [76.2 kg] 76.2 kg (04/23 0223) ? ?General: lying in bed, NAD ?RS: Intubated ?Neuro: Off sedation, awakens, question if he is trying to track today but that is inconsistent.  Also inconsistent thumbs up on the right hand to command.  equal round and reactive to light, blinking, cough reflex intact, withdraws to noxious stimuli in all extremities ?Somewhat improved exam compared to yesterday. ? ? ?Basic Metabolic Panel: ?Recent Labs  ?Lab 03/22/22 ?0148 03/22/22 ?4097 03/23/22 ?3532 03/24/22 ?9924 03/25/22 ?0103 03/26/22 ?0121 03/28/22 ?2683  ?NA 138   < > 142 141 136 136 133*  ?K 3.8   < > 3.7 4.2 5.0 3.9 4.5  ?CL 105   < > 111 111 106 104 99  ?CO2 26   < > 23 24 26 25 25   ?GLUCOSE 104*   < > 115* 120* 116* 125* 117*  ?BUN 11   < > 13 14 15 18  26*  ?CREATININE 1.29*   < > 1.02 0.90 0.80 0.75 0.71  ?CALCIUM 9.0   < > 9.2 8.7* 9.1 9.5 9.4  ?MG 2.0  --  1.6* 1.6* 2.0 1.6* 1.7  ?PHOS 3.3  --  3.7 4.5  --   --   --   ? < > = values in this interval not displayed.  ? ? ? ?CBC: ?Recent Labs  ?Lab 03/23/22 ?0242 03/24/22 ?0431 03/26/22 ?0121 03/28/22 ?0224  ?WBC 7.8 7.6 9.7 9.6  ?HGB 11.0* 10.6* 11.9* 11.3*  ?HCT 34.2* 32.6* 36.4* 34.7*  ?MCV 84.0 81.9 81.6 82.6  ?PLT 258 200 259 257  ? ?Phenobarbital level  16.6 ? ?Overnight EEG with no seizures ?Last seizure 9:30 AM on 03/26/2022.  Has been seizure-free for around 48 hours now ? ?Imaging ?No new brain imaging overnight ?  ?ASSESSMENT AND PLAN:  61 year old male who presented with focal convulsive status epilepticus (left upper extremity twitching) in the setting of hypoglycemia.  EEG showed right hemispheric status epilepticus which has gradually improved. ?   ?Focal convulsive status epilepticus, resolved ?Acute encephalopathy, due to seizure and medication ?Chronic strokes ?Alcohol use disorder ?Continuous EEG with no evidence of ongoing seizure activity. ?  ?Recommendations ? ?-Continue perampanel 2 mg nightly ?Continue Vimpat 150 mg twice daily, Keppra 1500 mg twice daily, Depakote 500 mg every 6 hours ?-Continue phenobarbital 130 mg twice daily, level therapeutic yesterday.. ?-Depakote was discontinued Friday ?-Has been seizure-free for around 48 hours-we will discontinue LTM. ?-Continue seizure precautions ?-Management of rest of comorbidities per primary team ?-Discussed plan with Dr. 77 ?Plan discussed with Dr. Friday.   ? ?Dr. Wynona Neat from neurology team will follow tomorrow. ?   ?-- ?Wynona Neat, MD ?Neurologist ?[facility] ?Pager: [phone] ? ?CRITICAL CARE ATTESTATION ?Performed by: Milon Dikes, MD ?Total critical  care time: 35 minutes ?Critical care time was exclusive of separately billable procedures and treating other patients and/or supervising APPs/Residents/Students ?Critical care was necessary to treat or prevent imminent or life-threatening deterioration due to toxic metabolic encephalopathy, focal status epilepticus ?This patient is critically ill and at significant risk for neurological worsening and/or death and care requires constant monitoring. ?Critical care was time spent personally by me on the following activities: development of treatment plan with patient and/or surrogate as well as nursing, discussions with  consultants, evaluation of patient's response to treatment, examination of patient, obtaining history from patient or surrogate, ordering and performing treatments and interventions, ordering and review of laboratory studies, ordering and review of radiographic studies, pulse oximetry, re-evaluation of patient's condition, participation in multidisciplinary rounds and medical decision making of high complexity in the care of this patient. ? ?

## 2022-03-28 NOTE — Procedures (Addendum)
Patient Name: Frederick [name]  ?MRN: [id number]  ?Epilepsy Attending: Charlsie Quest  ?Referring Physician/Provider: Erick Blinks, MD ?Duration: 03/27/2022 1242 to 03/28/2022 1027 ?  ?Patient history: 61 y.o. male with a history of encephalopathy who is undergoing an EEG to evaluate for seizures. ?  ?Level of alertness: lethargic, asleep ?  ?AEDs during EEG study: LEV, LCM, Phenobarb, Perampanel ?  ?Technical aspects: This EEG study was done with scalp electrodes positioned according to the 10-20 International system of electrode placement. Electrical activity was acquired at a sampling rate of 500Hz  and reviewed with a high frequency filter of 70Hz  and a low frequency filter of 1Hz . EEG data were recorded continuously and digitally stored.  ?  ?Description: During awake state, EEG showed continuous generalized and lateralized right hemisphere 3-6hz  theta-delta slowing. Sleep was characterized by sleep spindles (12-14hz ), maximal frontocentral region. Spikes were noted in right centro-parietal region, maximal C4/P4/P8.  Hyperventilation and photic stimulation were not performed.    ?  ?ABNORMALITY ?- Spike, right centro-parietal region, maximal C4/P4/P8 ?- Continuous sow, generalized and lateralized right hemisphere  ?  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 Additionally, there is cortical dysfunction arising from right hemisphere likely due to post-ictal state as well as severe diffuse encephalopathy, non specific etiology. No seizures were seen during this study. ?   ?  ?

## 2022-03-28 NOTE — Progress Notes (Signed)
LTM EEG discontinued - no skin breakdown at unhook.   

## 2022-03-28 NOTE — Progress Notes (Signed)
RT note. ?SBT attempted this morning 10/5 40%. Patient went apnic. Patient flipped back to full support. RT will continue to monitor.  ?

## 2022-03-28 NOTE — Progress Notes (Signed)
[facility] ADULT ICU REPLACEMENT PROTOCOL ? ? ?The patient does apply for the [facility] Adult ICU Electrolyte Replacment Protocol based on the criteria listed below:  ? ?1.Exclusion criteria: TCTS patients, ECMO patients, and Dialysis patients ?2. Is GFR >/= 30 ml/min? Yes.    ?Patient's GFR today is >60 ?3. Is SCr </= 2? Yes.   ?Patient's SCr is 0.71 mg/dL ?4. Did SCr increase >/= 0.5 in 24 hours? Yes.   ?5.Pt's weight >40kg  Yes.   ?6. Abnormal electrolyte(s): Mag  ?7. Electrolytes replaced per protocol ?8.  Call MD STAT for K+ </= 2.5, Phos </= 1, or Mag </= 1 ?Physician:  Roxan Hockey ? ?Frederick [name] 03/28/2022 4:56 AM  ?

## 2022-03-29 ENCOUNTER — Other Ambulatory Visit (HOSPITAL_COMMUNITY): Payer: Self-pay

## 2022-03-29 DIAGNOSIS — R569 Unspecified convulsions: Secondary | ICD-10-CM | POA: Diagnosis not present

## 2022-03-29 LAB — POCT I-STAT 7, (LYTES, BLD GAS, ICA,H+H)
Acid-Base Excess: 4 mmol/L — ABNORMAL HIGH (ref 0.0–2.0)
Bicarbonate: 27.3 mmol/L (ref 20.0–28.0)
Calcium, Ion: 1.27 mmol/L (ref 1.15–1.40)
HCT: 32 % — ABNORMAL LOW (ref 39.0–52.0)
Hemoglobin: 10.9 g/dL — ABNORMAL LOW (ref 13.0–17.0)
O2 Saturation: 99 %
Patient temperature: 98.4
Potassium: 4.1 mmol/L (ref 3.5–5.1)
Sodium: 132 mmol/L — ABNORMAL LOW (ref 135–145)
TCO2: 28 mmol/L (ref 22–32)
pCO2 arterial: 35.2 mmHg (ref 32–48)
pH, Arterial: 7.497 — ABNORMAL HIGH (ref 7.35–7.45)
pO2, Arterial: 137 mmHg — ABNORMAL HIGH (ref 83–108)

## 2022-03-29 LAB — CBC
HCT: 36 % — ABNORMAL LOW (ref 39.0–52.0)
Hemoglobin: 11.5 g/dL — ABNORMAL LOW (ref 13.0–17.0)
MCH: 26.3 pg (ref 26.0–34.0)
MCHC: 31.9 g/dL (ref 30.0–36.0)
MCV: 82.2 fL (ref 80.0–100.0)
Platelets: 250 10*3/uL (ref 150–400)
RBC: 4.38 MIL/uL (ref 4.22–5.81)
RDW: 16.6 % — ABNORMAL HIGH (ref 11.5–15.5)
WBC: 8.4 10*3/uL (ref 4.0–10.5)
nRBC: 0 % (ref 0.0–0.2)

## 2022-03-29 LAB — BASIC METABOLIC PANEL
Anion gap: 9 (ref 5–15)
BUN: 27 mg/dL — ABNORMAL HIGH (ref 8–23)
CO2: 24 mmol/L (ref 22–32)
Calcium: 9.4 mg/dL (ref 8.9–10.3)
Chloride: 101 mmol/L (ref 98–111)
Creatinine, Ser: 0.69 mg/dL (ref 0.61–1.24)
GFR, Estimated: >60 mL/min (ref >60)
Glucose, Bld: 116 mg/dL — ABNORMAL HIGH (ref 70–99)
Potassium: 4.2 mmol/L (ref 3.5–5.1)
Sodium: 134 mmol/L — ABNORMAL LOW (ref 135–145)

## 2022-03-29 LAB — GLUCOSE, CAPILLARY
Glucose-Capillary: 110 mg/dL — ABNORMAL HIGH (ref 70–99)
Glucose-Capillary: 75 mg/dL (ref 70–99)
Glucose-Capillary: 116 mg/dL — ABNORMAL HIGH (ref 70–99)
Glucose-Capillary: 34 mg/dL — CL (ref 70–99)
Glucose-Capillary: 107 mg/dL — ABNORMAL HIGH (ref 70–99)
Glucose-Capillary: 50 mg/dL — ABNORMAL LOW (ref 70–99)
Glucose-Capillary: 61 mg/dL — ABNORMAL LOW (ref 70–99)
Glucose-Capillary: 81 mg/dL (ref 70–99)
Glucose-Capillary: 76 mg/dL (ref 70–99)

## 2022-03-29 LAB — MAGNESIUM: Magnesium: 1.8 mg/dL (ref 1.7–2.4)

## 2022-03-29 MED ORDER — DEXTROSE 10 % IV SOLN: INTRAVENOUS | Status: DC

## 2022-03-29 MED ORDER — DEXTROSE 5 % IV SOLN: INTRAVENOUS | Status: DC

## 2022-03-29 MED ORDER — DEXMEDETOMIDINE HCL IN NACL 400 MCG/100ML IV SOLN
0.4000 ug/kg/h | INTRAVENOUS | Status: DC
  Administered 2022-03-29: 0.4 ug/kg/h via INTRAVENOUS
  Filled 2022-03-29: qty 100

## 2022-03-29 MED ORDER — QUETIAPINE FUMARATE 25 MG PO TABS
25.0000 mg | ORAL_TABLET | Freq: Every day | ORAL | Status: DC
Start: 2022-03-29 — End: 2022-03-30

## 2022-03-29 MED ORDER — MAGNESIUM SULFATE 2 GM/50ML IV SOLN
2.0000 g | Freq: Once | INTRAVENOUS | Status: AC
  Administered 2022-03-29: 2 g via INTRAVENOUS
  Filled 2022-03-29: qty 50

## 2022-03-29 NOTE — Procedures (Signed)
Extubation Procedure Note ? ?Patient Details:   ?Name: Frederick [name] ?DOB: 1961-06-20 ?MRN: [id number] ?  ?Airway Documentation:  ?  ?Vent end date: 03/29/22 Vent end time: 1120  ? ?Evaluation ? O2 sats: stable throughout ?Complications: No apparent complications ?Patient did tolerate procedure well. ?Bilateral Breath Sounds: Clear, Diminished ?  ?Yes ? ?Patient extubated per order to 4L [place] with no apparent complications. Positive cuff leak was noted prior to extubation. Patient is alert, able to follow commands, and able to speak with strong voice. Vitals are stable. RT will continue to monitor.  ? ?[name] ?03/29/2022, 11:24 AM ? ?

## 2022-03-29 NOTE — Progress Notes (Signed)
Subjective: Frederick [name]. ? ?ROS: Unable to obtain due to intubation ? ?Examination ? ?Vital signs in last 24 hours: ?Temp:  [96.7 ?F (35.9 ?C)-98.5 ?F (36.9 ?C)] 98.4 ?F (36.9 ?C) (04/24 1948) ?Pulse Rate:  [71-119] 78 (04/24 2045) ?Resp:  [4-26] 22 (04/24 2045) ?BP: (76-155)/(44-108) 106/66 (04/24 2045) ?SpO2:  [92 %-100 %] 99 % (04/24 2045) ?FiO2 (%):  [30 %-36 %] 36 % (04/24 1126) ?Weight:  [76.2 kg] 76.2 kg (04/24 0128) ? ?General: lying in bed, NAD ?RS: Intubated ?Neuro: Off sedation, awake, following simple commands, tracks examiner in the room, pupils equal round and reactive to light, blinking, cough reflex intact, withdraws to noxious stimuli in all extremities ? ?Basic Metabolic Panel: ?Recent Labs  ?Lab 03/23/22 ?0242 03/24/22 ?0431 03/25/22 ?0103 03/26/22 ?0121 03/28/22 ?0224 03/29/22 ?1093 03/29/22 ?2048  ?NA 142 141 136 136 133* 134* 132*  ?K 3.7 4.2 5.0 3.9 4.5 4.2 4.1  ?CL 111 111 106 104 99 101  --   ?CO2 23 24 26 25 25 24   --   ?GLUCOSE 115* 120* 116* 125* 117* 116*  --   ?BUN 13 14 15 18  26* 27*  --   ?CREATININE 1.02 0.90 0.80 0.75 0.71 0.69  --   ?CALCIUM 9.2 8.7* 9.1 9.5 9.4 9.4  --   ?MG 1.6* 1.6* 2.0 1.6* 1.7 1.8  --   ?PHOS 3.7 4.5  --   --   --   --   --   ? ? ?CBC: ?Recent Labs  ?Lab 03/23/22 ?0242 03/24/22 ?0431 03/26/22 ?0121 03/28/22 ?0224 03/29/22 ?03/30/22 03/29/22 ?2048  ?WBC 7.8 7.6 9.7 9.6 8.4  --   ?HGB 11.0* 10.6* 11.9* 11.3* 11.5* 10.9*  ?HCT 34.2* 32.6* 36.4* 34.7* 36.0* 32.0*  ?MCV 84.0 81.9 81.6 82.6 82.2  --   ?PLT 258 200 259 257 250  --   ? ? ? ?Coagulation Studies: ?No results for input(s): LABPROT, INR in the last 72 hours. ? ?Imaging ?No new brain imaging overnight ?  ?ASSESSMENT AND PLAN:  61 year old male who presented with focal convulsive status epilepticus (left upper extremity twitching) in the setting of hypoglycemia.  EEG showed right hemispheric status epilepticus which has gradually improved. ?   ?Focal convulsive status epilepticus, resolved ?Acute encephalopathy, due  to seizure and medication ?Chronic strokes ?Alcohol use disorder ?- No seizure overnight ?  ?Recommendations ?-Continue Vimpat 150 mg twice daily, Keppra 1500 mg twice daily, perampanel 2mg  daily ?- Continue phenobarbital 130 mg twice daily, will likely start weaning tomorrow if no agitation ?-Continue seizure precautions ?-Management of rest of comorbidities per primary team ?-Discussed plan with Dr. 2049 ?   ?I have spent a total of  27 minutes with the patient reviewing hospital notes,  test results, labs and examining the patient as well as establishing an assessment and plan.  > 50% of time was spent in direct patient care.  ?  ? ?77 ?Epilepsy ?[facility] ?For questions after 5pm please refer to AMION to reach the Neurologist on call ? ?

## 2022-03-29 NOTE — Progress Notes (Addendum)
? ?NAME:  Frederick [name], MRN:  [id number], DOB:  July 14, 1961, LOS: 9 ?ADMISSION DATE:  03/19/2022, CONSULTATION DATE:  03/20/22 ?REFERRING MD:  Dr. Derry Lory CHIEF COMPLAINT:  Focal SE  ? ?History of Present Illness:  ?Patient is a 61 year old male with prior history of seizure, heavy alcohol abuse, asthma, and hypertension who presented on 4/14 with witnessed seizures.  Initially treated with versed with EMS which abated seizure.  On arrival to ER, patient had left hemiparesis therefore code stroke activated.  After CT, had more seizure activity requiring more ativan and keppra for cessation.   ?  ?Prior hospitalization in February for generalized tonic-clonic status epilepticus requiring intubation secondary to medication non-compliance.  Hospitalization complicated by small right cerebellum stroke.   ?  ?He was since admitted to [facility] with neurology following.  CIWA protocol active. On 4/15, patient developed focal status on left thought to be progressing to GTC despite more AED now with concern for airway.  PCCM consulted for transfer to ICU for intubation and pressure support. ? ?Pertinent  Medical History  ?Tobacco abuse, seizure, heavy alcohol abuse, asthma, hypertension, PE (not on AC), pulmonary TB (treated), CVA ?Significant Hospital Events: ?Including procedures, antibiotic start and stop dates in addition to other pertinent events   ?4/14 admitted [facility] r/o stroke, seizures ?4/15 PCCM consulted, focal status progressing-> intubation  ?04/16 versed gtt started and Perampanel given once.  ?04/17 phenobarbital 130 mg given once ?04/18 planned sedation wean by neurology ?04/24 Weaning off vent with planned extubation if wean successful.  ?Interim History / Subjective:  ?Agitation overnight. Fentanyl given but still agitated. Wrist restraints ordered. Precedex added as well. Mag 1.8 repleted. Follows commands.  ?Review of Systems:   ?Negative unless stated in the subjective. ?Objective Bps elevated with 167/87  highest.   ?Blood pressure (!) 107/58, pulse 71, temperature (!) 97.5 ?F (36.4 ?C), temperature source Axillary, resp. rate 15, height 6\' 1"  (1.854 m), weight 76.5 kg, SpO2 100 %. ?   ?Vent Mode: PRVC ?FiO2 (%):  [30 %] 30 % ?Set Rate:  [15 bmp] 15 bmp ?Vt Set:  mL] 640 mL ?PEEP:  [5 cmH20] 5 cmH20 ?Plateau Pressure:  [15 cmH20-20 cmH20] 16 cmH20  ? ?Intake/Output Summary (Last 24 hours) at 03/29/2022 0714 ?Last data filed at 03/29/2022 03/31/2022 ?Gross per 24 hour  ?Intake 2654.73 ml  ?Output 2360 ml  ?Net 294.73 ml  ? ?2.4 L output. 1 stool occurrence yesterday.  ?Filed Weights  ? 03/27/22 0146 03/28/22 0223 03/29/22 0128  ?Weight: 76.6 kg 76.2 kg 76.2 kg  ? ?Examination: ?General: NAD, opens eyes intermittently ?HEENT: NCAT, intubated ?Lungs: Rhonci present, vented sounds ?Cardiovascular: Normal rate and rhythm ?Abdomen: No TTP, distended, normal bowel sounds ?Extremities: No asymmetry. Weakness diffuse in all extremities. Mitts placed bilaterally.  ?Neuro: opens eyes, follows commands, intubated. Weakness present diffusely but no focal deficits noted.  ?GU:   Catheter in place.  ? ?Consults  ?Neurology following ?Resolved Hospital Problem list   ?Status epilepticus ?Assessment & Plan:  ?Status epilepticus ?History of EtOH abuse and medical noncompliance ?History of seizures ?-On Keppra 1 g twice daily ?-Perampanel 2 mg qhs ?-Phenobarbital 130 mg BID  ?-Vimpat q12 hrs 200 mg ?-Appreciate neurology follow-up, appreciated their recommendations.  ?-Plan is to continue above meds and extubate today.  ? ?Acute respiratory failure related to status epilepticus ?Presumed aspiration pneumonia ?Urinary tract infection ?S/p amoxicillin on 04/22. Mental status precludes extubation. Mental status improving. Will plan to wean off vent today.  ?-Continue  mechanical ventilator support ?-VAP prevention protocol ?-Wean FiO2 as tolerated, plan for extubation today if patient able to wean.  ? ?History of EtOH abuse ?-On  phenobarb ?-Empiric thiamine/folate/MVI ? ?Hypertension ?-Continue Coreg 25 mg BID ?-Continue to monitor closely ? 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 ?Electrolyte derangement ?Mild hyponatremia ?Mild. Improving. Will continue to monitor.  ? ?Normocytic anemia ?-Continue to monitor ?-Transfusion per protocol ? ? ?Best Practice (right click and "Reselect all SmartList Selections" daily)  ?Diet/type: tubefeeds ?DVT prophylaxis: LMWH ?GI prophylaxis: PPI ?Lines: N/A ?Foley:  N/A ?Continuous: None ?Code Status:  full code ?Last date of multidisciplinary goals of care discussion []  ?Labs   ?Labs show mag of 1.8, CBC without leukocytosis and BMP with slight hyponatremia that is improving and slight BUN elevation.  ? ?Past Medical History:  ?He,  has a past medical history of Arthritis, Asthma, Bilateral lower extremity edema (05/28/2021), Cough (05/28/2021), Dyspnea, Dyspnea (05/28/2021), EtOH dependence (HCC), Fibula fracture, Finger osteomyelitis, right (HCC) (05/26/2021), Hernia, Hypertension, Pulmonary embolism (HCC), Seizures (HCC), Streptococcal bacteremia (09/09/2021), and Tuberculosis.  ?Surgical History:  ? ?Past Surgical History:  ?Procedure Laterality Date  ? AMPUTATION Right 05/29/2021  ? Procedure: PARTIAL RIGHT 2ND FINGER AMPUTATION;  Surgeon: 05/31/2021, MD;  Location: [facility];  Service: Orthopedics;  Laterality: Right;  ? ANKLE SURGERY    ? INGUINAL HERNIA REPAIR Right 01/19/2013  ? Procedure: HERNIA REPAIR INGUINAL ADULT;  Surgeon: 01/21/2013, MD,FACS;  Location: [facility] OR;  Service: General;  Laterality: Right;  ? TEE WITHOUT CARDIOVERSION N/A 08/14/2021  ? Procedure: TRANSESOPHAGEAL ECHOCARDIOGRAM (TEE);  Surgeon: 10/14/2021, MD;  Location: [facility] ENDOSCOPY;  Service: Cardiovascular;  Laterality: N/A;  ? TIBIA IM NAIL INSERTION Left 01/22/2013  ? Procedure: INTRAMEDULLARY (IM) NAIL TIBIAL;  Surgeon: 01/24/2013,  MD;  Location: [facility] OR;  Service: Orthopedics;  Laterality: Left;  ?  ?Social History:  ? reports that he has been smoking cigarettes. He has been smoking an average of .5 packs per day. He has never used smokeless tobacco. He reports current alcohol use. He reports that he does not use drugs.  ?Family History:  ?His family history includes Diabetes in his father and mother; Hypertension in his father.  ?Allergies ?No Known Allergies  ?Home Medications  ?Prior to Admission medications   ?Medication Sig Start Date End Date Taking? Authorizing Provider  ?albuterol (VENTOLIN HFA) 108 (90 Base) MCG/ACT inhaler Inhale 2 puffs into the lungs every 4 (four) hours as needed for shortness of breath or wheezing. 12/06/19  Yes [provider]  ?apixaban (ELIQUIS) 5 MG TABS tablet Take 5 mg by mouth 2 (two) times daily.   Yes [provider]  ?carvedilol (COREG) 25 MG tablet Take 1 tablet (25 mg total) by mouth 2 (two) times daily with a meal. 01/22/22  Yes Ghimire, 01/24/22, MD  ?cilostazol (PLETAL) 100 MG tablet Take 1 tablet (100 mg total) by mouth 2 (two) times daily. 01/22/22  Yes Ghimire, 01/24/22, MD  ?cyanocobalamin 100 MCG tablet Take 5 tablets (500 mcg total) by mouth daily. 01/22/22  Yes Ghimire, 01/24/22, MD  ?diclofenac Sodium (VOLTAREN) 1 % GEL Apply 4 g topically 4 (four) times daily as needed for pain. 10/26/21  Yes [provider]  ?fluticasone (FLONASE) 50 MCG/ACT nasal spray Place 1 spray into both nostrils daily as needed for allergies or rhinitis.   Yes [provider]  ?gabapentin (NEURONTIN) 100 MG capsule  Take 100 mg by mouth 2 (two) times daily. 02/11/22  Yes [provider]  ?ipratropium-albuterol (DUONEB) 0.5-2.5 (3) MG/3ML SOLN Take 3 mLs by nebulization every 6 (six) hours as needed. ?Patient taking differently: Take 3 mLs by nebulization every 6 (six) hours as needed (wheezing, sob.). 08/28/21  Yes Regalado, Belkys A, MD  ?levETIRAcetam (KEPPRA) 1000 MG  tablet Take 1 tablet (1,000 mg total) by mouth 2 (two) times daily. 08/28/21  Yes Regalado, Belkys A, MD  ?magnesium oxide (MAG-OX) 400 (240 Mg) MG tablet Take 0.5 tablets (200 mg total) by mouth 2 (two) times daily. 9/23/2

## 2022-03-29 NOTE — TOC Benefit Eligibility Note (Signed)
Patient Advocate Encounter ? ?Insurance verification completed.   ? ?The patient is currently admitted and upon discharge could be taking phenobarbital 128.6 mg (using phenobarbital 64.8 mg tablets). ? ?The current 30 day co-pay is, $0.00.  ? ?The patient is currently admitted and upon discharge could be taking Fycomba 2 mg. ? ?The current 30 day co-pay is, $0.00.  ? ? ?The patient is insured through [employer]  ? ? ? ?Roland Earl, CPhT ?Pharmacy Patient Advocate Specialist ?[facility] Pharmacy Patient Advocate Team ?Direct Number: [phone]  Fax: [phone] ? ? ? ? ? ?  ?

## 2022-03-29 NOTE — Progress Notes (Signed)
eLink Physician-Brief Progress Note ?Patient Name: BEAUMONT AUSTAD ?DOB: 1961/06/07 ?MRN: [id number] ? ? ?Date of Service ? 03/29/2022  ?HPI/Events of Note ? Blood sugar 61 mg / dl, patient somnolent  and with a soft BP on Precedex gtt at 0.8 mcg.  ?eICU Interventions ? D 10 % gtt increased to 75 ml / hour, Precedex gtt on hold,  stat ABG to r/o hypercapnia contributing to somnolence, monitor BP closely - will consider fluid bolus + / - peripheral Phenylephrine gtt if it remains soft despite stopping Precedex.  ? ? ? ?  ? ?Thomasene Lot Aaliah Jorgenson ?03/29/2022, 8:33 PM ?

## 2022-03-29 NOTE — Progress Notes (Signed)
[facility] ADULT ICU REPLACEMENT PROTOCOL ? ? ?The patient does apply for the [facility] Adult ICU Electrolyte Replacment Protocol based on the criteria listed below:  ? ?1.Exclusion criteria: TCTS patients, ECMO patients, and Dialysis patients ?2. Is GFR >/= 30 ml/min? Yes.    ?Patient's GFR today is >60 ?3. Is SCr </= 2? Yes.   ?Patient's SCr is 0.69 mg/dL ?4. Did SCr increase >/= 0.5 in 24 hours? No. ?5.Pt's weight >40kg  Yes.   ?6. Abnormal electrolyte(s): Mag  ?7. Electrolytes replaced per protocol ?8.  Call MD STAT for K+ </= 2.5, Phos </= 1, or Mag </= 1 ?Physician:  Roxan Hockey ? ?Frederick [name] 03/29/2022 2:23 AM  ?

## 2022-03-30 ENCOUNTER — Inpatient Hospital Stay (HOSPITAL_COMMUNITY): Payer: Medicare Other

## 2022-03-30 DIAGNOSIS — R569 Unspecified convulsions: Secondary | ICD-10-CM | POA: Diagnosis not present

## 2022-03-30 LAB — CBC
HCT: 35.5 % — ABNORMAL LOW (ref 39.0–52.0)
Hemoglobin: 11.6 g/dL — ABNORMAL LOW (ref 13.0–17.0)
MCH: 27 pg (ref 26.0–34.0)
MCHC: 32.7 g/dL (ref 30.0–36.0)
MCV: 82.6 fL (ref 80.0–100.0)
Platelets: 294 10*3/uL (ref 150–400)
RBC: 4.3 MIL/uL (ref 4.22–5.81)
RDW: 17 % — ABNORMAL HIGH (ref 11.5–15.5)
WBC: 7.7 10*3/uL (ref 4.0–10.5)
nRBC: 0 % (ref 0.0–0.2)

## 2022-03-30 LAB — MAGNESIUM: Magnesium: 1.8 mg/dL (ref 1.7–2.4)

## 2022-03-30 LAB — BASIC METABOLIC PANEL
Anion gap: 5 (ref 5–15)
BUN: 20 mg/dL (ref 8–23)
CO2: 26 mmol/L (ref 22–32)
Calcium: 9.5 mg/dL (ref 8.9–10.3)
Chloride: 102 mmol/L (ref 98–111)
Creatinine, Ser: 0.77 mg/dL (ref 0.61–1.24)
GFR, Estimated: >60 mL/min (ref >60)
Glucose, Bld: 108 mg/dL — ABNORMAL HIGH (ref 70–99)
Potassium: 4 mmol/L (ref 3.5–5.1)
Sodium: 133 mmol/L — ABNORMAL LOW (ref 135–145)

## 2022-03-30 LAB — GLUCOSE, CAPILLARY
Glucose-Capillary: 78 mg/dL (ref 70–99)
Glucose-Capillary: 108 mg/dL — ABNORMAL HIGH (ref 70–99)
Glucose-Capillary: 114 mg/dL — ABNORMAL HIGH (ref 70–99)
Glucose-Capillary: 90 mg/dL (ref 70–99)
Glucose-Capillary: 93 mg/dL (ref 70–99)
Glucose-Capillary: 95 mg/dL (ref 70–99)

## 2022-03-30 MED ORDER — PROSOURCE TF PO LIQD
45.0000 mL | Freq: Two times a day (BID) | ORAL | Status: DC
  Administered 2022-03-30 – 2022-04-10 (×24): 45 mL
  Filled 2022-03-30 (×24): qty 45

## 2022-03-30 MED ORDER — LIDOCAINE HCL URETHRAL/MUCOSAL 2 % EX GEL
1.0000 "application " | Freq: Once | CUTANEOUS | Status: AC
  Administered 2022-03-30: 1 via INTRATRACHEAL
  Filled 2022-03-30: qty 6

## 2022-03-30 MED ORDER — PSYLLIUM 95 % PO PACK: 1.0000 | PACK | Freq: Every day | ORAL | Status: DC

## 2022-03-30 MED ORDER — MAGNESIUM SULFATE 2 GM/50ML IV SOLN
2.0000 g | Freq: Once | INTRAVENOUS | Status: AC
  Administered 2022-03-30: 2 g via INTRAVENOUS
  Filled 2022-03-30: qty 50

## 2022-03-30 MED ORDER — QUETIAPINE FUMARATE 50 MG PO TABS
25.0000 mg | ORAL_TABLET | Freq: Every day | ORAL | Status: DC
  Administered 2022-03-30 – 2022-04-05 (×7): 25 mg
  Filled 2022-03-30 (×7): qty 1

## 2022-03-30 MED ORDER — JEVITY 1.5 CAL/FIBER PO LIQD
1000.0000 mL | ORAL | Status: DC
  Administered 2022-03-30 – 2022-04-10 (×11): 1000 mL
  Filled 2022-03-30 (×18): qty 1000

## 2022-03-30 MED ORDER — PHENOBARBITAL SODIUM 65 MG/ML IJ SOLN
65.0000 mg | Freq: Two times a day (BID) | INTRAMUSCULAR | Status: AC
  Administered 2022-03-30 – 2022-03-31 (×2): 65 mg via INTRAVENOUS
  Filled 2022-03-30 (×2): qty 1

## 2022-03-30 MED ORDER — PHENOBARBITAL SODIUM 65 MG/ML IJ SOLN
65.0000 mg | Freq: Every day | INTRAMUSCULAR | Status: AC
  Administered 2022-03-31 – 2022-04-01 (×2): 65 mg via INTRAVENOUS
  Filled 2022-03-30 (×2): qty 1

## 2022-03-30 MED ORDER — NUTRISOURCE FIBER PO PACK
1.0000 | PACK | Freq: Two times a day (BID) | ORAL | Status: DC
  Filled 2022-03-30: qty 1

## 2022-03-30 NOTE — Progress Notes (Signed)
? ?NAME:  Frederick [name], MRN:  [id number], DOB:  Sep 03, 1961, LOS: 10 ?ADMISSION DATE:  03/19/2022, CONSULTATION DATE:  03/20/22 ?REFERRING MD:  Dr. Derry Lory CHIEF COMPLAINT:  Focal SE  ? ?History of Present Illness:  ?Patient is a 61 year old male with prior history of seizure, heavy alcohol abuse, asthma, and hypertension who presented on 4/14 with witnessed seizures.  Initially treated with versed with EMS which abated seizure.  On arrival to ER, patient had left hemiparesis therefore code stroke activated.  After CT, had more seizure activity requiring more ativan and keppra for cessation.   ?  ?Prior hospitalization in February for generalized tonic-clonic status epilepticus requiring intubation secondary to medication non-compliance.  Hospitalization complicated by small right cerebellum stroke.   ?  ?He was since admitted to [facility] with neurology following.  CIWA protocol active. On 4/15, patient developed focal status on left thought to be progressing to GTC despite more AED now with concern for airway.  PCCM consulted for transfer to ICU for intubation and pressure support. ? ?Pertinent  Medical History  ?Tobacco abuse, seizure, heavy alcohol abuse, asthma, hypertension, PE (not on AC), pulmonary TB (treated), CVA ?Significant Hospital Events: ?Including procedures, antibiotic start and stop dates in addition to other pertinent events   ?4/14 admitted [facility] r/o stroke, seizures ?4/15 PCCM consulted, focal status progressing-> intubation  ?04/16 versed gtt started and Perampanel given once.  ?04/17 phenobarbital 130 mg given once ?04/18 planned sedation wean by neurology ?04/24 Weaning off vent with planned extubation if wean successful.  ?Interim History / Subjective:  ?Agitation overnight and precedex ordered and then discontinued. No acute concerns. Not fully oriented but answers questions and follows commands.  ?Review of Systems:   ?Negative unless stated in the subjective. ?Objective Bps elevated with  167/87 highest.   ?Blood pressure (!) 107/58, pulse 71, temperature (!) 97.5 ?F (36.4 ?C), temperature source Axillary, resp. rate 15, height 6\' 1"  (1.854 m), weight 76.5 kg, SpO2 100 %. ?   ?Vent Mode: PSV;CPAP ?FiO2 (%):  [30 %-36 %] 36 % ?Set Rate:  [15 bmp] 15 bmp ?Vt Set:  mL] 640 mL ?PEEP:  [5 cmH20] 5 cmH20 ?Pressure Support:  [10 cmH20] 10 cmH20 ?Plateau Pressure:  [16 cmH20] 16 cmH20  ? ?Intake/Output Summary (Last 24 hours) at 03/30/2022 0649 ?Last data filed at 03/30/2022 0608 ?Gross per 24 hour  ?Intake 2098.87 ml  ?Output 3110 ml  ?Net -1011.13 ml  ? ?2.8 L output. 1 stool occurrence yesterday.  ?Filed Weights  ? 03/27/22 0146 03/28/22 0223 03/29/22 0128  ?Weight: 76.6 kg 76.2 kg 76.2 kg  ? ?Examination: ?General: NAD, laying comfortably in bed ?HEENT: MMM, NCAT ?Lungs: CTAB ?Cardiovascular: NSR, normal rate 2+ pulses  ?Abdomen: no TTP, normal bowel sounds ?Extremities: no asymmetery, no lesions noted ?Neuro: alert and oriented x1-2.  ?GU:   cahteter in place light colored urine.  ? ?Consults  ?Neurology following ?Resolved Hospital Problem list   ?Status epilepticus ?Aspiration Pneumonia ?Urinary Tract Infection ?Assessment & Plan:  ?History of seizures ?History of EtOH abuse and medical noncompliance ?-On Keppra 1.5 g twice daily ?-Perampanel 2 mg qhs ?-Phenobarbital 130 mg BID, neurology will wean ?-Vimpat q12 hrs 150 mg ?-Appreciate neurology follow-up, appreciated their recommendations.  ? ?Acute respiratory failure related to status epilepticus ?Presumed aspiration pneumonia ?Urinary tract infection ?Improving. Extubated yesterday. S/p amoxicillin on 04/22. Mental status improving.  ? ?History of EtOH abuse ?-On phenobarb, neurology will wean ?-Empiric thiamine/folate/MVI ? ?Hypertension ?Chronic. Became hypotensive with precedex  but that resolved.  ?-Continue Coreg 25 mg BID ?-Continue to monitor closely ? 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 ?Electrolyte derangement ?Mild hyponatremia ?Mild. Improving. Will continue to monitor.  ?-Will replete mag and K as needed.  ? ?Normocytic anemia ?-Continue to monitor ?-Transfusion per protocol ? ? ?Best Practice (right click and "Reselect all SmartList Selections" daily)  ?Diet/type: NPO  ?DVT prophylaxis: LMWH ?GI prophylaxis: PPI ?Lines: N/A ?Foley:  N/A ?Continuous: None ?Code Status:  full code ?Last date of multidisciplinary goals of care discussion []  ?Labs   ?Labs show mag of 1.8, CBC without leukocytosis and BMP with slight hyponatremia that is improving and slight BUN elevation. Lab work unchanged from yesterday overall.  ? ?Past Medical History:  ?He,  has a past medical history of Arthritis, Asthma, Bilateral lower extremity edema (05/28/2021), Cough (05/28/2021), Dyspnea, Dyspnea (05/28/2021), EtOH dependence (HCC), Fibula fracture, Finger osteomyelitis, right (HCC) (05/26/2021), Hernia, Hypertension, Pulmonary embolism (HCC), Seizures (HCC), Streptococcal bacteremia (09/09/2021), and Tuberculosis.  ?Surgical History:  ? ?Past Surgical History:  ?Procedure Laterality Date  ? AMPUTATION Right 05/29/2021  ? Procedure: PARTIAL RIGHT 2ND FINGER AMPUTATION;  Surgeon: 05/31/2021, MD;  Location: [facility];  Service: Orthopedics;  Laterality: Right;  ? ANKLE SURGERY    ? INGUINAL HERNIA REPAIR Right 01/19/2013  ? Procedure: HERNIA REPAIR INGUINAL ADULT;  Surgeon: 01/21/2013, MD,FACS;  Location: [facility] OR;  Service: General;  Laterality: Right;  ? TEE WITHOUT CARDIOVERSION N/A 08/14/2021  ? Procedure: TRANSESOPHAGEAL ECHOCARDIOGRAM (TEE);  Surgeon: 10/14/2021, MD;  Location: [facility] ENDOSCOPY;  Service: Cardiovascular;  Laterality: N/A;  ? TIBIA IM NAIL INSERTION Left 01/22/2013  ? Procedure: INTRAMEDULLARY (IM) NAIL TIBIAL;  Surgeon: 01/24/2013, MD;  Location: [facility] OR;  Service: Orthopedics;  Laterality: Left;  ?  ?Social History:  ? reports that he has been smoking  cigarettes. He has been smoking an average of .5 packs per day. He has never used smokeless tobacco. He reports current alcohol use. He reports that he does not use drugs.  ?Family History:  ?His family history includes Diabetes in his father and mother; Hypertension in his father.  ?Allergies ?No Known Allergies  ?Home Medications  ?Prior to Admission medications   ?Medication Sig Start Date End Date Taking? Authorizing Provider  ?albuterol (VENTOLIN HFA) 108 (90 Base) MCG/ACT inhaler Inhale 2 puffs into the lungs every 4 (four) hours as needed for shortness of breath or wheezing. 12/06/19  Yes [provider]  ?apixaban (ELIQUIS) 5 MG TABS tablet Take 5 mg by mouth 2 (two) times daily.   Yes [provider]  ?carvedilol (COREG) 25 MG tablet Take 1 tablet (25 mg total) by mouth 2 (two) times daily with a meal. 01/22/22  Yes Ghimire, 01/24/22, MD  ?cilostazol (PLETAL) 100 MG tablet Take 1 tablet (100 mg total) by mouth 2 (two) times daily. 01/22/22  Yes Ghimire, 01/24/22, MD  ?cyanocobalamin 100 MCG tablet Take 5 tablets (500 mcg total) by mouth daily. 01/22/22  Yes Ghimire, 01/24/22, MD  ?diclofenac Sodium (VOLTAREN) 1 % GEL Apply 4 g topically 4 (four) times daily as needed for pain. 10/26/21  Yes [provider]  ?fluticasone (FLONASE) 50 MCG/ACT nasal spray Place 1 spray into both nostrils daily as needed for allergies or rhinitis.   Yes [provider]  ?gabapentin (NEURONTIN) 100 MG capsule Take 100 mg by mouth 2 (two) times daily. 02/11/22  Yes  [provider]  ?ipratropium-albuterol (DUONEB) 0.5-2.5 (3) MG/3ML SOLN Take 3 mLs by nebulization every 6 (six) hours as needed. ?Patient taking differently: Take 3 mLs by nebulization every 6 (six) hours as needed (wheezing, sob.). 08/28/21  Yes Regalado, Belkys A, MD  ?levETIRAcetam (KEPPRA) 1000 MG tablet Take 1 tablet (1,000 mg total) by mouth 2 (two) times daily. 08/28/21  Yes Regalado, Belkys A, MD  ?magnesium oxide  (MAG-OX) 400 (240 Mg) MG tablet Take 0.5 tablets (200 mg total) by mouth 2 (two) times daily. 08/28/21  Yes Regalado, Belkys A, MD  ?Multiple Vitamin (MULTIVITAMIN WITH MINERALS) TABS tablet Take 1 tablet by mouth dail

## 2022-03-30 NOTE — Progress Notes (Signed)
eLink Physician-Brief Progress Note ?Patient Name: Frederick [name] ?DOB: February 23, 1961 ?MRN: [id number] ? ? ?Date of Service ? 03/30/2022  ?HPI/Events of Note ? Patient needs AM labs ordered.  ?eICU Interventions ? AM labs ordered.  ? ? ? ?  ? ?Thomasene Lot Renji Berwick ?03/30/2022, 11:36 PM ?

## 2022-03-30 NOTE — Progress Notes (Signed)
Subjective: No acute events overnight.  No new concerns.  Per critical care team, does have fluctuating mental status ? ?ROS: negative except above ? ?Examination ? ?Vital signs in last 24 hours: ?Temp:  [97.8 ?F (36.6 ?C)-98.4 ?F (36.9 ?C)] 98.3 ?F (36.8 ?C) (04/25 0730) ?Pulse Rate:  [71-119] 77 (04/25 0800) ?Resp:  [4-26] 21 (04/25 0800) ?BP: (77-155)/(44-106) 107/66 (04/25 0800) ?SpO2:  [92 %-100 %] 98 % (04/25 0800) ?FiO2 (%):  [30 %-36 %] 36 % (04/24 1126) ? ?General: lying in bed, NAD ?Neuro: Awake, alert, able to tell me his name, significantly dysarthric speech, follows simple one-step commands like sticking out his tongue, appears to have left-sided neglect, PERRLA, tracks examiner in room, antigravity strength in all 4 extremities ? ?Basic Metabolic Panel: ?Recent Labs  ?Lab 03/24/22 ?0431 03/25/22 ?0103 03/26/22 ?0121 03/28/22 ?0224 03/29/22 ?[id number] 03/29/22 ?2048 03/30/22 ?[id number]  ?NA 141 136 136 133* 134* 132* 133*  ?K 4.2 5.0 3.9 4.5 4.2 4.1 4.0  ?CL 111 106 104 99 101  --  102  ?CO2 24 26 25 25 24   --  26  ?GLUCOSE 120* 116* 125* 117* 116*  --  108*  ?BUN 14 15 18  26* 27*  --  20  ?CREATININE 0.90 0.80 0.75 0.71 0.69  --  0.77  ?CALCIUM 8.7* 9.1 9.5 9.4 9.4  --  9.5  ?MG 1.6* 2.0 1.6* 1.7 1.8  --  1.8  ?PHOS 4.5  --   --   --   --   --   --   ? ? ?CBC: ?Recent Labs  ?Lab 03/24/22 ?0431 03/26/22 ?0121 03/28/22 ?0224 03/29/22 ?[id number] 03/29/22 ?2048 03/30/22 ?[id number]  ?WBC 7.6 9.7 9.6 8.4  --  7.7  ?HGB 10.6* 11.9* 11.3* 11.5* 10.9* 11.6*  ?HCT 32.6* 36.4* 34.7* 36.0* 32.0* 35.5*  ?MCV 81.9 81.6 82.6 82.2  --  82.6  ?PLT 200 259 257 250  --  294  ? ? ? ?Coagulation Studies: ?No results for input(s): LABPROT, INR in the last 72 hours. ? ?Imaging ?No new brain imaging overnight ?  ?ASSESSMENT AND PLAN:  61 year old male who presented with focal convulsive status epilepticus (left upper extremity twitching) in the setting of hypoglycemia.  EEG showed right hemispheric status epilepticus which has gradually  improved. ?   ?Focal convulsive status epilepticus, resolved ?Acute encephalopathy, improving ?Chronic strokes ?Alcohol use disorder ?- No seizure overnight ?  ?Recommendations ?-Continue Vimpat 150 mg twice daily, Keppra 1500 mg twice daily, perampanel 2mg  daily ?-Wean phenobarbital, taper ordered in epic. ?-If patient remains seizure-free, will likely discontinue perampanel next week. ?-Continue seizure precautions ?-Management of rest of comorbidities per primary team ?-Discussed plan with Dr. Ander Slade ?   ?I have spent a total of  35 minutes with the patient reviewing hospital notes,  test results, labs and examining the patient as well as establishing an assessment and plan.  > 50% of time was spent in direct patient care.  ? ? ?Zeb Comfort ?Epilepsy ?[facility] ?For questions after 5pm please refer to AMION to reach the Neurologist on call ? ?

## 2022-03-30 NOTE — Evaluation (Signed)
Clinical/Bedside Swallow Evaluation ?Patient Details  ?Name: Frederick [name] ?MRN: [id number] ?Date of Birth: Mar 25, 1961 ? ?Today's Date: 03/30/2022 ?Time: SLP Start Time (ACUTE ONLY): [id number] SLP Stop Time (ACUTE ONLY): [id number] ?SLP Time Calculation (min) (ACUTE ONLY): 15 min ? ?Past Medical History:  ?Past Medical History:  ?Diagnosis Date  ? Arthritis   ? Asthma   ? Bilateral lower extremity edema 05/28/2021  ? propping feet up  ? Cough 05/28/2021  ? with clear sputum  ? Dyspnea   ? Dyspnea 05/28/2021  ? with exertion  ? EtOH dependence ([place])   ? drinks 2-3 of 40ounes a day  ? Fibula fracture   ? Dr Mardelle Matte 02-2011  ? Finger osteomyelitis, right ([place]) 05/26/2021  ? right index finger  ? Hernia   ? surgery 01-17-13  ? Hypertension   ? not taking bp meds since may 2022  ? Pulmonary embolism ([place])   ? Seizures ([place])   ? Streptococcal bacteremia 09/09/2021  ? Tuberculosis   ? couple of yrs ago took tx at [facility] per pt on 05-28-2021  ? ?Past Surgical History:  ?Past Surgical History:  ?Procedure Laterality Date  ? AMPUTATION Right 05/29/2021  ? Procedure: PARTIAL RIGHT 2ND FINGER AMPUTATION;  Surgeon: Mcarthur Rossetti, MD;  Location: [facility];  Service: Orthopedics;  Laterality: Right;  ? ANKLE SURGERY    ? INGUINAL HERNIA REPAIR Right 01/19/2013  ? Procedure: HERNIA REPAIR INGUINAL ADULT;  Surgeon: Gayland Curry, MD,FACS;  Location: [place];  Service: General;  Laterality: Right;  ? TEE WITHOUT CARDIOVERSION N/A 08/14/2021  ? Procedure: TRANSESOPHAGEAL ECHOCARDIOGRAM (TEE);  Surgeon: Josue Hector, MD;  Location: [facility] ENDOSCOPY;  Service: Cardiovascular;  Laterality: N/A;  ? TIBIA IM NAIL INSERTION Left 01/22/2013  ? Procedure: INTRAMEDULLARY (IM) NAIL TIBIAL;  Surgeon: Johnny Bridge, MD;  Location: [place];  Service: Orthopedics;  Laterality: Left;  ? ?HPI:  ?Pt is a 61 yo male presenting 4/14 due to seizure activity and possible increased weakness on L with increased slurred speech. CT  Head 4/14 and CXR 4/15 with no acute findings. Initially evaluated by SLP 4/15 and kept NPO, but was then intubated later that day; extubated 4/24. Pt was recently admitted in February for status epilepticus, stroke, requiring intubation. MBS that admission showed a mild oropharyngeal dysphagia with effortful mastication and reduced lingual propulsion, intermittent penetration that cleared spontaneously. He also had suspected osteophytes that appeared to contribute to mild amounts of backflow that re-enter the pharynx and the barium tablet was not observed to exit the esophagus. Dys 3 diet and thin liquids were recommended. PMH also includes: CVA, seizure, heavy alcohol abuse, asthma, HTN, tobacco abuse, PE, pulmonary TB (treated)  ?  ?Assessment / Plan / Recommendation  ?Clinical Impression ? Pt is drowsy and confused, not following commands well, with impact on awareness and attention to bolus presentation. He has a wet vocal quality at baseline after prolonged intubation and inconsistently coughs to command, although even when he tries it seems to be very weak. A few ice chips were provided to try to clear secretions and assess current level of function. His oral phase appears to be sluggish, but he does show some attempt at mastication and he consistently swallows as suggested by hyolaryngeal movement to palpation; however, there is concern for pharyngeal dysphagia with potential for decreased airway protection given frequent coughing elicited. Would remain NPO for now. SLP will continue to follow to facilitate return to PO diet pending  additional time post-extubation and improvements in mentation. ?SLP Visit Diagnosis: Dysphagia, unspecified (R13.10) ?   ?Aspiration Risk ? Moderate aspiration risk;Severe aspiration risk  ?  ?Diet Recommendation NPO;Alternative means - temporary  ? ?Medication Administration: Via alternative means  ?  ?Other  Recommendations Oral Care Recommendations: Oral care QID ?Other  Recommendations: Have oral suction available   ? ?Recommendations for follow up therapy are one component of a multi-disciplinary discharge planning process, led by the attending physician.  Recommendations may be updated based on patient status, additional functional criteria and insurance authorization. ? ?Follow up Recommendations Skilled nursing-short term rehab (<3 hours/day)  ? ? ?  ?Assistance Recommended at Discharge Frequent or constant Supervision/Assistance  ?Functional Status Assessment Patient has had a recent decline in their functional status and demonstrates the ability to make significant improvements in function in a reasonable and predictable amount of time.  ?Frequency and Duration min 2x/week  ?2 weeks ?  ?   ? ?Prognosis Prognosis for Safe Diet Advancement: Good ?Barriers to Reach Goals: Cognitive deficits  ? ?  ? ?Swallow Study   ?General HPI: Pt is a 61 yo male presenting 4/14 due to seizure activity and possible increased weakness on L with increased slurred speech. CT Head 4/14 and CXR 4/15 with no acute findings. Initially evaluated by SLP 4/15 and kept NPO, but was then intubated later that day; extubated 4/24. Pt was recently admitted in February for status epilepticus, stroke, requiring intubation. MBS that admission showed a mild oropharyngeal dysphagia with effortful mastication and reduced lingual propulsion, intermittent penetration that cleared spontaneously. He also had suspected osteophytes that appeared to contribute to mild amounts of backflow that re-enter the pharynx and the barium tablet was not observed to exit the esophagus. Dys 3 diet and thin liquids were recommended. PMH also includes: CVA, seizure, heavy alcohol abuse, asthma, HTN, tobacco abuse, PE, pulmonary TB (treated) ?Type of Study: Bedside Swallow Evaluation ?Previous Swallow Assessment: 01/19/22 MBS  Rx D3/thin ?Diet Prior to this Study: NPO ?Temperature Spikes Noted: No ?Respiratory Status: Nasal  cannula ?History of Recent Intubation: Yes ?Length of Intubations (days): 9 days ?Date extubated: 03/29/22 ?Behavior/Cognition: Lethargic/Drowsy;Requires cueing ?Oral Cavity Assessment: Within Functional Limits ?Oral Care Completed by SLP: Yes ?Oral Cavity - Dentition: Adequate natural dentition;Missing dentition ?Self-Feeding Abilities: Total assist ?Patient Positioning: Upright in bed ?Baseline Vocal Quality: Wet;Low vocal intensity ?Volitional Cough: Weak ?Volitional Swallow: Unable to elicit  ?  ?Oral/Motor/Sensory Function Overall Oral Motor/Sensory Function:  (not following commands for direct assessment)   ?Ice Chips Ice chips: Impaired ?Presentation: Spoon ?Oral Phase Impairments: Poor awareness of bolus ?Pharyngeal Phase Impairments: Wet Vocal Quality;Cough - Immediate;Cough - Delayed   ?Thin Liquid Thin Liquid: Not tested  ?  ?Nectar Thick Nectar Thick Liquid: Not tested   ?Honey Thick Honey Thick Liquid: Not tested   ?Puree Puree: Not tested   ?Solid ? ? ?  Solid: Not tested  ? ?  ? ?Osie Bond., M.A. CCC-SLP ?Acute Rehabilitation Services ?Office [phone] ? ?Secure chat preferred ? ?03/30/2022,9:45 AM ? ? ? ?

## 2022-03-30 NOTE — Progress Notes (Signed)
Pharmacy Electrolyte Replacement ? ?Recent Labs: ? ?Recent Labs  ?  03/30/22 ?[id number]  ?K 4.0  ?MG 1.8  ?CREATININE 0.77  ? ? ?Low Critical Values (K </= 2.5, Phos </= 1, Mg </= 1)  ? ?Plan:  ?Magnesium sulfate 2g IV x1 ?F/up Mg with AM labs tomorrow  ? ?Joseph Art, Pharm.D. ?PGY-1 Pharmacy Resident ?[id number] ?03/30/2022 8:28 AM ? ?

## 2022-03-30 NOTE — Progress Notes (Addendum)
Nutrition Follow-up ? ?DOCUMENTATION CODES:  ? ?Non-severe (moderate) malnutrition in context of social or environmental circumstances ? ?INTERVENTION:  ? ?Tube feeding via NG tube: ?Jevity 1.5 at 60 ml/h (1440 ml per day). ?Prosource TF 45 ml BID ? ?Provides 2240 kcal, 114 gm protein, 1094 ml free water daily. ? ?When IVF d/c, add free water flushes 175 ml q 4 hours for a total of 2144 ml free water daily.  ? ?Discontinue Nutrisource fiber; Jevity 1.5 contains 21 gm fiber per liter.  ? ?Continue MVI with minerals daily via tube.  ? ?NUTRITION DIAGNOSIS:  ? ?Moderate Malnutrition related to social / environmental circumstances (alcohol abuse) as evidenced by mild fat depletion, mild muscle depletion, moderate muscle depletion. ? ?Ongoing ? ?GOAL:  ? ?Patient will meet greater than or equal to 90% of their needs ? ?Progressing with re-initiation of TF. ? ?MONITOR:  ? ?Vent status, TF tolerance, Labs ? ?REASON FOR ASSESSMENT:  ? ?Ventilator, Consult ?Enteral/tube feeding initiation and management ? ?ASSESSMENT:  ? ?61 yo male admitted with witnessed seizures. PMH includes seizure, heavy alcohol abuse, asthma, HTN. ? ?Discussed patient in ICU rounds and with RN today. ?Patient was extubated 4/24. ?Patient did not pass swallow evaluation with SLP this morning. ?25 French NG tube placed by CCM resident. Awaiting x-ray confirmation of placement. ?Received MD Consult for TF initiation and management. ?Previous TF orders: Vital AF 1.2 at 80 ml/h.  ? ?Labs reviewed. Na 133 ?CBG: 78-108 ? ?Medications reviewed and include Nutrisource fiber 1 packet BID, folic acid, MVI with minerals, thiamine, Keppra, mag sulfate. ?IVF: D10 at 75 ml/h.  ?  ?Admission weight 78.9 kg ?Current weight 76.2 kg (4/24) ? ?Diet Order:   ?Diet Order   ? ?       ?  Diet NPO time specified  Diet effective now       ?  ? ?  ?  ? ?  ? ? ?EDUCATION NEEDS:  ? ?No education needs have been identified at this time ? ?Skin:  Skin Assessment: Reviewed RN  Assessment ? ?Last BM:  4/25 type 7 ? ?Height:  ? ?Ht Readings from Last 1 Encounters:  ?03/19/22 6\' 1"  (1.854 m)  ? ? ?Weight:  ? ?Wt Readings from Last 1 Encounters:  ?03/29/22 76.2 kg  ? ? ? ?BMI:  Body mass index is 22.16 kg/m?. ? ?Estimated Nutritional Needs:  ? ?Kcal:  2200-2400 kcal/d ? ?Protein:  110-125 gm ? ?Fluid:  2.2-2.4 L/d ? ? ?03/31/22 RD, LDN, CNSC ?Please refer to Amion for contact information.                                                       ? ?

## 2022-03-31 ENCOUNTER — Inpatient Hospital Stay (HOSPITAL_COMMUNITY): Payer: Medicare Other

## 2022-03-31 DIAGNOSIS — G9341 Metabolic encephalopathy: Secondary | ICD-10-CM | POA: Diagnosis not present

## 2022-03-31 DIAGNOSIS — F1029 Alcohol dependence with unspecified alcohol-induced disorder: Secondary | ICD-10-CM | POA: Diagnosis not present

## 2022-03-31 DIAGNOSIS — I10 Essential (primary) hypertension: Secondary | ICD-10-CM | POA: Diagnosis not present

## 2022-03-31 DIAGNOSIS — R569 Unspecified convulsions: Secondary | ICD-10-CM | POA: Diagnosis not present

## 2022-03-31 LAB — BASIC METABOLIC PANEL
Anion gap: 7 (ref 5–15)
BUN: 20 mg/dL (ref 8–23)
CO2: 26 mmol/L (ref 22–32)
Calcium: 9.4 mg/dL (ref 8.9–10.3)
Chloride: 101 mmol/L (ref 98–111)
Creatinine, Ser: 0.73 mg/dL (ref 0.61–1.24)
GFR, Estimated: >60 mL/min (ref >60)
Glucose, Bld: 135 mg/dL — ABNORMAL HIGH (ref 70–99)
Potassium: 4.2 mmol/L (ref 3.5–5.1)
Sodium: 134 mmol/L — ABNORMAL LOW (ref 135–145)

## 2022-03-31 LAB — CBC WITH DIFFERENTIAL/PLATELET
Abs Immature Granulocytes: 0.05 10*3/uL (ref 0.00–0.07)
Basophils Absolute: 0.1 10*3/uL (ref 0.0–0.1)
Basophils Relative: 1 %
Eosinophils Absolute: 0.5 10*3/uL (ref 0.0–0.5)
Eosinophils Relative: 6 %
HCT: 35.3 % — ABNORMAL LOW (ref 39.0–52.0)
Hemoglobin: 11.6 g/dL — ABNORMAL LOW (ref 13.0–17.0)
Immature Granulocytes: 1 %
Lymphocytes Relative: 22 %
Lymphs Abs: 1.9 10*3/uL (ref 0.7–4.0)
MCH: 26.9 pg (ref 26.0–34.0)
MCHC: 32.9 g/dL (ref 30.0–36.0)
MCV: 81.7 fL (ref 80.0–100.0)
Monocytes Absolute: 1.5 10*3/uL — ABNORMAL HIGH (ref 0.1–1.0)
Monocytes Relative: 18 %
Neutro Abs: 4.7 10*3/uL (ref 1.7–7.7)
Neutrophils Relative %: 52 %
Platelets: 340 10*3/uL (ref 150–400)
RBC: 4.32 MIL/uL (ref 4.22–5.81)
RDW: 16.7 % — ABNORMAL HIGH (ref 11.5–15.5)
WBC: 8.7 10*3/uL (ref 4.0–10.5)
nRBC: 0 % (ref 0.0–0.2)

## 2022-03-31 LAB — GLUCOSE, CAPILLARY
Glucose-Capillary: 102 mg/dL — ABNORMAL HIGH (ref 70–99)
Glucose-Capillary: 105 mg/dL — ABNORMAL HIGH (ref 70–99)
Glucose-Capillary: 110 mg/dL — ABNORMAL HIGH (ref 70–99)
Glucose-Capillary: 117 mg/dL — ABNORMAL HIGH (ref 70–99)
Glucose-Capillary: 121 mg/dL — ABNORMAL HIGH (ref 70–99)
Glucose-Capillary: 97 mg/dL (ref 70–99)

## 2022-03-31 LAB — MAGNESIUM: Magnesium: 1.7 mg/dL (ref 1.7–2.4)

## 2022-03-31 MED ORDER — LEVETIRACETAM 100 MG/ML PO SOLN
1500.0000 mg | Freq: Two times a day (BID) | ORAL | Status: DC
Start: 2022-03-31 — End: 2022-04-06
  Administered 2022-03-31 – 2022-04-06 (×12): 1500 mg
  Filled 2022-03-31 (×12): qty 15

## 2022-03-31 MED ORDER — LACOSAMIDE 50 MG PO TABS
150.0000 mg | ORAL_TABLET | Freq: Two times a day (BID) | ORAL | Status: DC
  Administered 2022-03-31 – 2022-04-10 (×21): 150 mg
  Filled 2022-03-31 (×21): qty 3

## 2022-03-31 NOTE — Progress Notes (Signed)
SLP Cancellation Note ? ?Patient Details ?Name: Frederick [name] ?MRN: [id number] ?DOB: 16-Feb-1961 ? ? ?Cancelled treatment:       Reason Eval/Treat Not Completed: Fatigue/lethargy limiting ability to participate;Other (comment) (SLP will attempt later this PM schedule permitting or next date) ? ? ?Sonia Baller, MA, CCC-SLP ?Speech Therapy ? ?

## 2022-03-31 NOTE — Progress Notes (Addendum)
Subjective: No acute events overnight.  Still very dysarthric. ? ?ROS: negative except above ? ?Examination ? ?Vital signs in last 24 hours: ?Temp:  [98.4 ?F (36.9 ?C)-99.1 ?F (37.3 ?C)] 98.5 ?F (36.9 ?C) (04/26 1120) ?Pulse Rate:  [77-96] 95 (04/26 1400) ?Resp:  [16-31] 21 (04/26 1400) ?BP: (100-151)/(62-91) 141/91 (04/26 1400) ?SpO2:  [94 %-100 %] 97 % (04/26 1400) ?Weight:  [76.9 kg] 76.9 kg (04/26 0500) ? ?General: lying in bed, NAD ?Neuro: Awake, alert, able to tell me his name, still significantly dysarthric speech, follows simple one-step commands like sticking out his tongue, left-sided neglect appears to be improving, PERRLA, tracks examiner in room, antigravity strength in all 4 extremities ?  ?Basic Metabolic Panel: ?Recent Labs  ?Lab 03/26/22 ?0121 03/28/22 ?0224 03/29/22 ?5621 03/29/22 ?2048 03/30/22 ?3086 03/31/22 ?0143  ?NA 136 133* 134* 132* 133* 134*  ?K 3.9 4.5 4.2 4.1 4.0 4.2  ?CL 104 99 101  --  102 101  ?CO2 25 25 24   --  26 26  ?GLUCOSE 125* 117* 116*  --  108* 135*  ?BUN 18 26* 27*  --  20 20  ?CREATININE 0.75 0.71 0.69  --  0.77 0.73  ?CALCIUM 9.5 9.4 9.4  --  9.5 9.4  ?MG 1.6* 1.7 1.8  --  1.8 1.7  ? ? ?CBC: ?Recent Labs  ?Lab 03/26/22 ?0121 03/28/22 ?0224 03/29/22 ?03/31/22 03/29/22 ?2048 03/30/22 ?04/01/22 03/31/22 ?0143  ?WBC 9.7 9.6 8.4  --  7.7 8.7  ?NEUTROABS  --   --   --   --   --  4.7  ?HGB 11.9* 11.3* 11.5* 10.9* 11.6* 11.6*  ?HCT 36.4* 34.7* 36.0* 32.0* 35.5* 35.3*  ?MCV 81.6 82.6 82.2  --  82.6 81.7  ?PLT 259 257 250  --  294 340  ? ? ? ?Coagulation Studies: ?No results for input(s): LABPROT, INR in the last 72 hours. ? ?Imaging ?No new brain imaging overnight ?  ?ASSESSMENT AND PLAN:  61 year old male who presented with focal convulsive status epilepticus (left upper extremity twitching) in the setting of hypoglycemia.  EEG showed right hemispheric status epilepticus which has gradually improved. ?   ?Focal convulsive status epilepticus, resolved ?Acute encephalopathy, resolved ?Chronic  strokes ?Alcohol use disorder ?- No seizure overnight ?  ?Recommendations ?-Continue Vimpat 150 mg twice daily, Keppra 1500 mg twice daily, perampanel 2mg  daily.  Can be switched to p.o. when able ?-Wean phenobarbital, taper ordered in epic. ?-If patient remains seizure-free, will likely discontinue perampanel next week. ?-Continue seizure precautions ?-Management of rest of comorbidities per primary team ? ?Thank you for allowing 77 to participate in the care of this patient.  Neurology will follow peripherally.  Please call for any further questions. ?   ?I have spent a total of  26 minutes with the patient reviewing hospital notes,  test results, labs and examining the patient as well as establishing an assessment and plan.  > 50% of time was spent in direct patient care.  ? ? ? ?[place] ?Epilepsy ?[facility] ?For questions after 5pm please refer to AMION to reach the Neurologist on call ? ?

## 2022-03-31 NOTE — Procedures (Signed)
Cortrak ? ?Person Inserting Tube:  Andrey Spearman, RD ?Tube Type:  Cortrak - 43 inches ?Tube Size:  10 ?Tube Location:  Left nare ?Secured by: Turkey Callas ?Technique Used to Measure Tube Placement:  Marking at nare/corner of mouth ?Cortrak Secured At:  69 cm ? ?Cortrak Tube Team Note: ? ?Consult received to place a Cortrak feeding tube.  ? ?X-ray is required, abdominal x-ray has been ordered by the Cortrak team. Please confirm tube placement before using the Cortrak tube.  ? ?If the tube becomes dislodged please keep the tube and contact the Cortrak team at [email or web address] (password [email or web address]) for replacement.  ?If after hours and replacement cannot be delayed, place a NG tube and confirm placement with an abdominal x-ray.  ? ? ?Kirby Crigler RD, LDN ?Clinical Dietitian ?See AMiON for contact information.  ? ? ?

## 2022-03-31 NOTE — Progress Notes (Signed)
? ?[facility] ?PROGRESS NOTE ? ? ?SAINTCLAIR SCHROADER [id number]:[id number] DOB: [birth year] DOA: 03/19/2022  11 ?DOS: the patient was seen and examined on 03/31/2022 ? ?PCP: Pa, [facility] ? ?Brief History and Hospital Course: 61 y.o. male with history of seizure, alcohol abuse, pulmonary diverticulosis, hypertension who was admitted in February for status epilepticus in the setting of patient being noncompliant with his medication had to be intubated at the time and also had a stroke. He was found to have generalized tonic-clonic activity by patient's family and was brought to the ER.  Initially admitted to the floor.  However patient decompensated and had to be transferred to the ICU.  Had to be intubated again.  Neurology was consulted.  Patient was stabilized.  Subsequently extubated. ? ?Consultants: Neurology.  Critical care medicine. ? ?Procedures: Intubation followed by extubation ? ? ? ?Subjective: ?Patient remains poorly responsive.  Discussed with CCM MD.  This is how he has been ever since extubation.  Attributed to multiple antiepileptics. ? ? ? ?Assessment/Plan: ? ?History of seizures/status epilepticus ?History of EtOH abuse and medical noncompliance ?Neurology continues to follow.  Initially in status epilepticus.  Had to be intubated and subsequently extubated. ?Neurology recommends continuing Vimpat, Keppra and perampanel. ?Phenobarbital being weaned.  Neurology plans to discontinue perampanel next week if he remains seizure-free. ? ?Acute metabolic encephalopathy ?Secondary to seizures.  Remains encephalopathic.  Medications also contributing.  No focal deficits noted.  Patient does have recent history of stroke.  Continue to monitor.  Remains afebrile.  Being given Seroquel for agitation. ? ?Acute respiratory failure related to status epilepticus ?Presumed aspiration pneumonia ?Urinary tract infection ?Seems to be stable from a respiratory standpoint.  Patient was extubated. ?Completed course of  antibiotics.   ?  ?History of EtOH abuse ?Being weaned off of phenobarbital.  Continue with thiamine folate multivitamins. ? ?Nutrition ?Due to encephalopathy patient is unable to safely take oral diet.  He has a nasogastric feeding tube.  This is to be continued for now. ? ?Hypertension ?Chronic. Became hypotensive with precedex but that resolved.  ?Currently noted to be on carvedilol. ? 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  Being continued on aspirin.  Noted to be on cilostazol. ? ?Electrolyte derangement ?Mild hyponatremia ?Stable.  Continue to monitor. ? ?Normocytic anemia ?Stable.  Continue to monitor.  No evidence of overt bleeding. ? ?Previous history of DVT/PE ?Has completed course of anticoagulation. ?  ?Moderate protein calorie malnutrition ?Nutrition Problem: Moderate Malnutrition ?Etiology: social / environmental circumstances (alcohol abuse) ?Signs/Symptoms: mild fat depletion, mild muscle depletion, moderate muscle depletion ?Interventions: Tube feeding ? ? ?DVT Prophylaxis: On Lovenox ?Code Status: Full code ?Family Communication: No family at bedside ?Disposition Plan: Remains encephalopathic.  Not ready for discharge.  PT and OT evaluations starting tomorrow when hopefully he is more awake. ? ?Status is: Inpatient ?Remains inpatient appropriate because: Acute encephalopathy, seizures ? ? ? ? ?Medications: Scheduled: ? aspirin  300 mg Rectal Daily  ? Or  ? aspirin  325 mg Per Tube Daily  ? carvedilol  25 mg Per Tube BID WC  ? chlorhexidine gluconate (MEDLINE KIT)  15 mL Mouth Rinse BID  ? Chlorhexidine Gluconate Cloth  6 each Topical Daily  ? cilostazol  100 mg Per Tube BID  ? enoxaparin (LOVENOX) injection  40 mg Subcutaneous Q24H  ? feeding supplement (PROSource TF)  45 mL Per Tube BID  ? folic acid  1 mg Per Tube Daily  ? mouth rinse  15 mL Mouth Rinse 10 times per day  ? multivitamin with minerals  1 tablet Per Tube Daily  ? perampanel  2 mg Per Tube  QHS  ? PHENObarbital  65 mg Intravenous Q1200  ? QUEtiapine  25 mg Per Tube QHS  ? thiamine  100 mg Per Tube Daily  ? ?Continuous: ? sodium chloride Stopped (03/30/22 2239)  ? dexmedetomidine (PRECEDEX) IV infusion Stopped (03/29/22 1924)  ? feeding supplement (JEVITY 1.5 CAL/FIBER) 1,000 mL (03/30/22 1353)  ? lacosamide (VIMPAT) IV Stopped (03/30/22 2227)  ? levETIRAcetam 1,500 mg (03/31/22 0528)  ? ?QMG:[id number] **OR** acetaminophen (TYLENOL) oral liquid 160 mg/5 mL **OR** acetaminophen, docusate, hydrALAZINE, polyethylene glycol ? ?Antibiotics: ?Anti-infectives (From admission, onward)  ? ? Start     Dose/Rate Route Frequency Ordered Stop  ? 03/24/22 1000  amoxicillin (AMOXIL) capsule 500 mg       ? 500 mg Per Tube Every 8 hours 03/24/22 0851 03/27/22 2121  ? 03/22/22 1000  Ampicillin-Sulbactam (UNASYN) 3 g in sodium chloride 0.9 % 100 mL IVPB  Status:  Discontinued       ? 3 g ?200 mL/hr over 30 Minutes Intravenous Every 6 hours 03/22/22 0842 03/24/22 0851  ? 03/20/22 1700  cefTRIAXone (ROCEPHIN) 2 g in sodium chloride 0.9 % 100 mL IVPB  Status:  Discontinued       ? 2 g ?200 mL/hr over 30 Minutes Intravenous Every 24 hours 03/20/22 1611 03/22/22 0842  ? ?  ? ? ?Objective: ? ?Vital Signs ? ?Vitals:  ? 03/31/22 0313 03/31/22 0500 03/31/22 0721 03/31/22 0800  ?BP:    120/74  ?Pulse:    84  ?Resp:    19  ?Temp: 99 ?F (37.2 ?C)  99.1 ?F (37.3 ?C)   ?TempSrc: Axillary  Oral   ?SpO2:    100%  ?Weight:  76.9 kg    ?Height:      ? ? ?Intake/Output Summary (Last 24 hours) at 03/31/2022 0943 ?Last data filed at 03/31/2022 [phone] ?Gross per 24 hour  ?Intake 1421.57 ml  ?Output 2080 ml  ?Net -658.43 ml  ? ?Filed Weights  ? 03/28/22 0223 03/29/22 0128 03/31/22 0500  ?Weight: 76.2 kg 76.2 kg 76.9 kg  ? ? ?General appearance: Somnolent. ?Resp: Clear to auscultation bilaterally.  Normal effort ?Cardio: S1-S2 is normal regular.  No S3-S4.  No rubs murmurs or bruit ?GI: Abdomen is soft.  Nontender nondistended.  Bowel sounds are  present normal.  No masses organomegaly ?Extremities: No edema.  Noted to move all of his extremities.  Restraints noted. ?Neurologic:  No focal neurological deficits.  ? ? ?Lab Results: ? ?Data Reviewed: I have personally reviewed labs and imaging study reports ? ?CBC: ?Recent Labs  ?Lab 03/26/22 ?0121 03/28/22 ?3888 03/29/22 ?2800 03/29/22 ?2048 03/30/22 ?3491 03/31/22 ?0143  ?WBC 9.7 9.6 8.4  --  7.7 8.7  ?NEUTROABS  --   --   --   --   --  4.7  ?HGB 11.9* 11.3* 11.5* 10.9* 11.6* 11.6*  ?HCT 36.4* 34.7* 36.0* 32.0* 35.5* 35.3*  ?MCV 81.6 82.6 82.2  --  82.6 81.7  ?PLT 259 257 250  --  294 340  ? ? ?Basic Metabolic Panel: ?Recent Labs  ?Lab 03/26/22 ?0121 03/28/22 ?7915 03/29/22 ?0569 03/29/22 ?2048 03/30/22 ?7948 03/31/22 ?0143  ?NA 136 133* 134* 132* 133* 134*  ?K 3.9 4.5 4.2 4.1 4.0 4.2  ?CL 104 99 101  --  102 101  ?CO2 '25 25 24  ' --  26 26  ?GLUCOSE 125* 117* 116*  --  108* 135*  ?BUN 18 26* 27*  --  20 20  ?CREATININE 0.75 0.71 0.69  --  0.77 0.73  ?CALCIUM 9.5 9.4 9.4  --  9.5 9.4  ?MG 1.6* 1.7 1.8  --  1.8 1.7  ? ? ?GFR: ?Estimated Creatinine Clearance: 105.5 mL/min (by C-G formula based on SCr of 0.73 mg/dL). ? ?CBG: ?Recent Labs  ?Lab 03/30/22 ?1529 03/30/22 ?1922 03/30/22 ?2319 03/31/22 ?9022 03/31/22 ?0719  ?GLUCAP 90 93 95 117* 121*  ? ? ? ?Radiology Studies: ?DG Abd Portable 1V ? ?Result Date: 03/30/2022 ?CLINICAL DATA:  Feeding tube placement/adjustment. EXAM: PORTABLE ABDOMEN - 1 VIEW COMPARISON:  Earlier the same date. FINDINGS: 1306 hours. Enteric tube has been slightly advanced, projecting over the mid stomach. The visualized bowel gas pattern is nonobstructive. IMPRESSION: Slight advancement of the enteric tube to the level of the mid stomach. Electronically Signed   By: Richardean Sale M.D.   On: 03/30/2022 13:21  ? ?DG Abd Portable 1V ? ?Result Date: 03/30/2022 ?CLINICAL DATA:  NG tube placement EXAM: PORTABLE ABDOMEN - 1 VIEW COMPARISON:  None. FINDINGS: Esophagogastric tube is positioned with  tip below the diaphragm, side port above the gastroesophageal junction. Recommend advancement. Nonobstructive pattern of included bowel gas. IMPRESSION: Esophagogastric tube is positioned with tip below the

## 2022-03-31 NOTE — Progress Notes (Signed)
Speech Language Pathology Treatment: Dysphagia  ?Patient Details ?Name: Frederick [name] ?MRN: [id number] ?DOB: [birth year] ?Today's Date: 03/31/2022 ?Time: [id number] ?SLP Time Calculation (min) (ACUTE ONLY): 20 min ? ?Assessment / Plan / Recommendation ?Clinical Impression ? SLP returned in PM to see patient as this AM he was unable to be aroused for treatment. Session focused on dysphagia goals. Majority of session consisted of extensive amount of oral care via toothette sponges and yaunkers suction. SLP removed large amount of secretions from teeth/gumline, hard and soft palate with most secretions being thick, light yellow but some sticky and adhereed to hard palate. Patient able to participate mildly in following commands to open mouth for oral care. SLP then presented toothette sponge soaked lightly in water (total of two trials) Patient did lightly suck on sponge, however no swallow palpated. SLP did not trial any other PO's as patient too lethargic and continues to not be able to manage secretions. He currently has Cortrak in place for non-oral feedings. SLP will continue to follow patient for PO trials/readiness. ? ?  ?HPI HPI: Frederick [name] is a 61 yo male presenting 4/14 due to seizure activity and possible increased weakness on L with increased slurred speech. CT Head 4/14 and CXR 4/15 with no acute findings. Initially evaluated by SLP 4/15 and kept NPO, but was then intubated later that day; extubated 4/24. Frederick [name] was recently admitted in February for status epilepticus, stroke, requiring intubation. MBS that admission showed a mild oropharyngeal dysphagia with effortful mastication and reduced lingual propulsion, intermittent penetration that cleared spontaneously. He also had suspected osteophytes that appeared to contribute to mild amounts of backflow that re-enter the pharynx and the barium tablet was not observed to exit the esophagus. Dys 3 diet and thin liquids were recommended. PMH also includes: CVA, seizure, heavy  alcohol abuse, asthma, HTN, tobacco abuse, PE, pulmonary TB (treated) ?  ?   ?SLP Plan ? Continue with current plan of care ? ?  ?  ?Recommendations for follow up therapy are one component of a multi-disciplinary discharge planning process, led by the attending physician.  Recommendations may be updated based on patient status, additional functional criteria and insurance authorization. ?  ? ?Recommendations  ?Diet recommendations: NPO ?Medication Administration: Via alternative means  ?   ?    ?   ? ? ? ? Oral Care Recommendations: Oral care QID;Staff/trained caregiver to provide oral care ?Follow Up Recommendations: Skilled nursing-short term rehab (<3 hours/day) ?Assistance recommended at discharge: Frequent or constant Supervision/Assistance ?SLP Visit Diagnosis: Dysphagia, unspecified (R13.10) ?Plan: Continue with current plan of care ? ? ? ? ?  ?  ?Sonia Baller, MA, CCC-SLP ?Speech Therapy ? ?

## 2022-04-01 DIAGNOSIS — I10 Essential (primary) hypertension: Secondary | ICD-10-CM | POA: Diagnosis not present

## 2022-04-01 DIAGNOSIS — R569 Unspecified convulsions: Secondary | ICD-10-CM | POA: Diagnosis not present

## 2022-04-01 DIAGNOSIS — G9341 Metabolic encephalopathy: Secondary | ICD-10-CM | POA: Diagnosis not present

## 2022-04-01 DIAGNOSIS — F1029 Alcohol dependence with unspecified alcohol-induced disorder: Secondary | ICD-10-CM | POA: Diagnosis not present

## 2022-04-01 LAB — GLUCOSE, CAPILLARY
Glucose-Capillary: 110 mg/dL — ABNORMAL HIGH (ref 70–99)
Glucose-Capillary: 106 mg/dL — ABNORMAL HIGH (ref 70–99)
Glucose-Capillary: 87 mg/dL (ref 70–99)
Glucose-Capillary: 89 mg/dL (ref 70–99)
Glucose-Capillary: 113 mg/dL — ABNORMAL HIGH (ref 70–99)
Glucose-Capillary: 88 mg/dL (ref 70–99)

## 2022-04-01 NOTE — Progress Notes (Signed)
? ?[facility] ?PROGRESS NOTE ? ? ?Frederick [name] YSA:[id number] DOB: Jun 10, 1961 DOA: 03/19/2022  12 ?DOS: the patient was seen and examined on 04/01/2022 ? ?PCP: Pa, [facility] ? ?Brief History and Hospital Course: 61 y.o. male with history of seizure, alcohol abuse, pulmonary diverticulosis, hypertension who was admitted in February for status epilepticus in the setting of patient being noncompliant with his medication had to be intubated at the time and also had a stroke. He was found to have generalized tonic-clonic activity by patient's family and was brought to the ER.  Initially admitted to the floor.  However patient decompensated and had to be transferred to the ICU.  Had to be intubated again.  Neurology was consulted.  Patient was stabilized.  Subsequently extubated. ? ?Consultants: Neurology.  Critical care medicine. ? ?Procedures: Intubation followed by extubation ? ? ? ?Subjective: ?Noted to be more awake and alert today compared to yesterday.  Was able to tell me he was in [place].  He got the year correct as well.  Still distracted.   ? ? ? ?Assessment/Plan: ? ?History of seizures/status epilepticus ?History of EtOH abuse and medical noncompliance ?Initially in status epilepticus.  Had to be intubated and subsequently extubated. ?Neurology recommends continuing Vimpat, Keppra and perampanel. ?Phenobarbital being weaned.  Neurology plans to discontinue perampanel next week if he remains seizure-free. ?Seems to be stable from a seizure standpoint. ? ?Acute metabolic encephalopathy ?Altered mental status thought to be secondary to seizure as well as medications.  Seems to be more awake, alert today.  Following commands.  Oriented to city and year.  Continue current medications for now including Seroquel.   ? ?Acute respiratory failure related to status epilepticus ?Presumed aspiration pneumonia ?Urinary tract infection ?Seems to be stable from a respiratory standpoint.  Patient was  extubated. ?Completed course of antibiotics.   ?  ?History of EtOH abuse ?Being weaned off of phenobarbital.  Continue with thiamine folate multivitamins. ? ?Nutrition ?Due to encephalopathy patient is unable to safely take oral diet.  He has a nasogastric feeding tube.  This is to be continued for now. ?Speech therapy to continue to follow.  Should be able to challenge him orally now that he is more awake and alert. ? ?Hypertension ?Chronic. Became hypotensive with precedex but that resolved.  ?Currently noted to be on carvedilol. ? 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  Being continued on aspirin.  Noted to be on cilostazol. ? ?Electrolyte derangement ?Mild hyponatremia ?Stable.  Continue to monitor. ? ?Normocytic anemia ?Stable.  Continue to monitor.  No evidence of overt bleeding.  Check labs periodically. ? ?Previous history of DVT/PE ?Has completed course of anticoagulation. ?  ?Moderate protein calorie malnutrition ?Nutrition Problem: Moderate Malnutrition ?Etiology: social / environmental circumstances (alcohol abuse) ?Signs/Symptoms: mild fat depletion, mild muscle depletion, moderate muscle depletion ?Interventions: Tube feeding ? ? ?DVT Prophylaxis: On Lovenox ?Code Status: Full code ?Family Communication: No family at bedside ?Disposition Plan: Remains encephalopathic.  Not ready for discharge.  PT and OT evaluation.  Speech therapy evaluation to continue. ? ?Status is: Inpatient ?Remains inpatient appropriate because: Acute encephalopathy, seizures ? ? ? ? ?Medications: Scheduled: ? aspirin  300 mg Rectal Daily  ? Or  ? aspirin  325 mg Per Tube Daily  ? carvedilol  25 mg Per Tube BID WC  ? chlorhexidine gluconate (MEDLINE KIT)  15 mL Mouth Rinse BID  ? Chlorhexidine Gluconate Cloth  6 each Topical Daily  ? cilostazol  100  mg Per Tube BID  ? enoxaparin (LOVENOX) injection  40 mg Subcutaneous Q24H  ? feeding supplement (PROSource TF)  45 mL Per Tube BID  ? folic  acid  1 mg Per Tube Daily  ? lacosamide  150 mg Per Tube BID  ? levETIRAcetam  1,500 mg Per Tube BID  ? multivitamin with minerals  1 tablet Per Tube Daily  ? perampanel  2 mg Per Tube QHS  ? PHENObarbital  65 mg Intravenous Q1200  ? QUEtiapine  25 mg Per Tube QHS  ? thiamine  100 mg Per Tube Daily  ? ?Continuous: ? sodium chloride 10 mL/hr at 03/31/22 0600  ? feeding supplement (JEVITY 1.5 CAL/FIBER) 1,000 mL (03/30/22 1353)  ? ?QIO:[id number] **OR** acetaminophen (TYLENOL) oral liquid 160 mg/5 mL **OR** acetaminophen, docusate, hydrALAZINE, polyethylene glycol ? ?Antibiotics: ?Anti-infectives (From admission, onward)  ? ? Start     Dose/Rate Route Frequency Ordered Stop  ? 03/24/22 1000  amoxicillin (AMOXIL) capsule 500 mg       ? 500 mg Per Tube Every 8 hours 03/24/22 0851 03/27/22 2121  ? 03/22/22 1000  Ampicillin-Sulbactam (UNASYN) 3 g in sodium chloride 0.9 % 100 mL IVPB  Status:  Discontinued       ? 3 g ?200 mL/hr over 30 Minutes Intravenous Every 6 hours 03/22/22 0842 03/24/22 0851  ? 03/20/22 1700  cefTRIAXone (ROCEPHIN) 2 g in sodium chloride 0.9 % 100 mL IVPB  Status:  Discontinued       ? 2 g ?200 mL/hr over 30 Minutes Intravenous Every 24 hours 03/20/22 1611 03/22/22 0842  ? ?  ? ? ?Objective: ? ?Vital Signs ? ?Vitals:  ? 04/01/22 0441 04/01/22 0500 04/01/22 0600 04/01/22 0700  ?BP:  124/72 101/63 136/82  ?Pulse:  76 77 90  ?Resp:  16 16 (!) 24  ?Temp:      ?TempSrc:      ?SpO2:  100% 100% 98%  ?Weight: 77.7 kg     ?Height:      ? ? ?Intake/Output Summary (Last 24 hours) at 04/01/2022 0855 ?Last data filed at 04/01/2022 0700 ?Gross per 24 hour  ?Intake 1430 ml  ?Output 1625 ml  ?Net -195 ml  ? ? ?Filed Weights  ? 03/29/22 0128 03/31/22 0500 04/01/22 0441  ?Weight: 76.2 kg 76.9 kg 77.7 kg  ? ? ?General appearance: Much more awake and alert this morning.  Distracted.  Mildly agitated. ?Resp: Clear to auscultation bilaterally.  Normal effort ?Cardio: S1-S2 is normal regular.  No S3-S4.  No rubs murmurs  or bruit ?GI: Abdomen is soft.  Nontender nondistended.  Bowel sounds are present normal.  No masses organomegaly ?Extremities: No edema.  Moving all of his extremity ?Neurologic: Oriented to city and year.  No focal neurological deficits.  ? ? ? ?Lab Results: ? ?Data Reviewed: I have personally reviewed labs and imaging study reports ? ?CBC: ?Recent Labs  ?Lab 03/26/22 ?0121 03/28/22 ?0102 03/29/22 ?7253 03/29/22 ?2048 03/30/22 ?6644 03/31/22 ?0143  ?WBC 9.7 9.6 8.4  --  7.7 8.7  ?NEUTROABS  --   --   --   --   --  4.7  ?HGB 11.9* 11.3* 11.5* 10.9* 11.6* 11.6*  ?HCT 36.4* 34.7* 36.0* 32.0* 35.5* 35.3*  ?MCV 81.6 82.6 82.2  --  82.6 81.7  ?PLT 259 257 250  --  294 340  ? ? ? ?Basic Metabolic Panel: ?Recent Labs  ?Lab 03/26/22 ?0121 03/28/22 ?0347 03/29/22 ?4259 03/29/22 ?2048 03/30/22 ?5638 03/31/22 ?0143  ?  NA 136 133* 134* 132* 133* 134*  ?K 3.9 4.5 4.2 4.1 4.0 4.2  ?CL 104 99 101  --  102 101  ?CO2 _0 --  26 26  ?GLUCOSE 125* 117* 116*  --  108* 135*  ?BUN 18 26* 27*  --  20 20  ?CREATININE 0.75 0.71 0.69  --  0.77 0.73  ?CALCIUM 9.5 9.4 9.4  --  9.5 9.4  ?MG 1.6* 1.7 1.8  --  1.8 1.7  ? ? ? ?GFR: ?Estimated Creatinine Clearance: 106.6 mL/min (by C-G formula based on SCr of 0.73 mg/dL). ? ?CBG: ?Recent Labs  ?Lab 03/31/22 ?0910 03/31/22 ?1932 03/31/22 ?2332 04/01/22 ?6816 04/01/22 ?6196  ?GLUCAP 110* 102* 97 110* 106*  ? ? ? ? ?Radiology Studies: ?DG Abd Portable 1V ? ?Result Date: 03/31/2022 ?CLINICAL DATA:  Feeding tube placement. EXAM: PORTABLE ABDOMEN - 1 VIEW COMPARISON:  03/30/2022. FINDINGS: Feeding tube terminates in the gastric antrum. Mild gaseous distension of colon. IMPRESSION: Feeding tube terminates in gastric antrum. Electronically Signed   By: Lorin Picket M.D.   On: 03/31/2022 10:41  ? ?DG Abd Portable 1V ? ?Result Date: 03/30/2022 ?CLINICAL DATA:  Feeding tube placement/adjustment. EXAM: PORTABLE ABDOMEN - 1 VIEW COMPARISON:  Earlier the same date. FINDINGS: 1306 hours. Enteric tube has  been slightly advanced, projecting over the mid stomach. The visualized bowel gas pattern is nonobstructive. IMPRESSION: Slight advancement of the enteric tube to the level of the mid stomach. Electronically [employer]

## 2022-04-01 NOTE — Progress Notes (Signed)
Speech Language Pathology Treatment: Dysphagia  ?Patient Details ?Name: Frederick [name] ?MRN: [id number] ?DOB: October 01, 1961 ?Today's Date: 04/01/2022 ?Time: 1829-9371 ?SLP Time Calculation (min) (ACUTE ONLY): 20 min ? ?Assessment / Plan / Recommendation ?Clinical Impression ? Patient seen by SLP for skilled treatment session focused on dysphagia goals. Patient in bed with eyes closed by awakened quickly with verbal and tactile cues and he was able to maintain alertness for entire session. His speech is very dysarthric and vocal intensity is low and so SLP is only able to make out parts of what he was saying. Speech intelligiblity was not adequate enough for SLP to judge content of patient's verbalizations. After oral care, patient was observed with PO's of 3 small ice chips. He required maximal verbal cues to orally manipulate ice chips and masticate which he did very lightly and inefficiently. Swallow was inconsistently initiated and when it was, suspect delayed. Patient exhibited delayed throat clearing, wet/gurgled sounding voice. SLP suctioned oral cavity with Yaunkers and removed clear-light whitish secretions and after that patient's voice was clear. SLP is recommending continued PO trials but patient is not yet ready for PO's, and not able to participate in objective swallow study at this time. SLP will continue to follow patient for PO readiness. ? ?  ?HPI HPI: Frederick [name] is a 61 yo male presenting 4/14 due to seizure activity and possible increased weakness on L with increased slurred speech. CT Head 4/14 and CXR 4/15 with no acute findings. Initially evaluated by SLP 4/15 and kept NPO, but was then intubated later that day; extubated 4/24. Frederick [name] was recently admitted in February for status epilepticus, stroke, requiring intubation. MBS that admission showed a mild oropharyngeal dysphagia with effortful mastication and reduced lingual propulsion, intermittent penetration that cleared spontaneously. He also had suspected  osteophytes that appeared to contribute to mild amounts of backflow that re-enter the pharynx and the barium tablet was not observed to exit the esophagus. Dys 3 diet and thin liquids were recommended. PMH also includes: CVA, seizure, heavy alcohol abuse, asthma, HTN, tobacco abuse, PE, pulmonary TB (treated) ?  ?   ?SLP Plan ? Continue with current plan of care ? ?  ?  ?Recommendations for follow up therapy are one component of a multi-disciplinary discharge planning process, led by the attending physician.  Recommendations may be updated based on patient status, additional functional criteria and insurance authorization. ?  ? ?Recommendations  ?Diet recommendations: NPO ?Medication Administration: Via alternative means  ?   ?    ?   ? ? ? ? Oral Care Recommendations: Oral care QID;Staff/trained caregiver to provide oral care ?Follow Up Recommendations: Skilled nursing-short term rehab (<3 hours/day) ?Assistance recommended at discharge: Frequent or constant Supervision/Assistance ?SLP Visit Diagnosis: Dysphagia, unspecified (R13.10) ?Plan: Continue with current plan of care ? ? ? ? ?  ?  ? ?Angela Nevin, MA, CCC-SLP ?Speech Therapy ? ?

## 2022-04-01 NOTE — Progress Notes (Signed)
Nutrition Follow-up ? ?DOCUMENTATION CODES:  ? ?Non-severe (moderate) malnutrition in context of social or environmental circumstances ? ?INTERVENTION:  ? ?Continue tube feeding via Cortrak tube: ?Jevity 1.5 at 60 ml/h (1440 ml per day). ?Prosource TF 45 ml BID ? ?Provides 2240 kcal, 114 gm protein, 1094 ml free water daily. ? ?As fluid status allows, add free water flushes 175 ml q 4 hours for a total of 2144 ml free water daily.  ? ?Continue MVI with minerals daily via tube.  ? ?NUTRITION DIAGNOSIS:  ? ?Moderate Malnutrition related to social / environmental circumstances (alcohol abuse) as evidenced by mild fat depletion, mild muscle depletion, moderate muscle depletion. ? ?Ongoing ? ?GOAL:  ? ?Patient will meet greater than or equal to 90% of their needs ? ?Met with TF. ? ?MONITOR:  ? ?Vent status, TF tolerance, Labs ? ?REASON FOR ASSESSMENT:  ? ?Ventilator, Consult ?Enteral/tube feeding initiation and management ? ?ASSESSMENT:  ? ?61 yo male admitted with witnessed seizures. PMH includes seizure, heavy alcohol abuse, asthma, HTN. ? ?Patient more awake and somewhat oriented today.  ?Cortrak was placed 4/26. Tip is in the gastric antrum.  ?Patient is currently receiving Jevity 1.5 via Cortrak at 60 ml/h (1440 ml/day) with Prosource TF 45 ml BID. Tolerating without difficuly.  ? ?S/P bedside swallow evaluation with SLP 4/26, unable to safely take POs, remains NPO. SLP to re-evaluate swallowing function today. ? ?Labs reviewed. Na 134 (4/26) ?CBG: 110-106 ? ?Medications reviewed and include folic acid, Keppra, MVI with minerals, phenobarbital, thiamine. ?IVF: off  ?  ?Admission weight 78.9 kg ?Current weight 77.7 kg  ? ?Diet Order:   ?Diet Order   ? ?       ?  Diet NPO time specified  Diet effective now       ?  ? ?  ?  ? ?  ? ? ?EDUCATION NEEDS:  ? ?No education needs have been identified at this time ? ?Skin:  Skin Assessment: Reviewed RN Assessment ? ?Last BM:  4/26 type 7 ? ?Height:  ? ?Ht Readings from Last 1  Encounters:  ?03/19/22 _0  (1.854 m)  ? ? ?Weight:  ? ?Wt Readings from Last 1 Encounters:  ?04/01/22 77.7 kg  ? ? ? ?BMI:  Body mass index is 22.6 kg/m?. ? ?Estimated Nutritional Needs:  ? ?Kcal:  2200-2400 kcal/d ? ?Protein:  110-125 gm ? ?Fluid:  2.2-2.4 L/d ? ? ?Lucas Mallow RD, LDN, CNSC ?Please refer to Amion for contact information.                                                       ? ?

## 2022-04-01 NOTE — Progress Notes (Signed)
Occupational Therapy Evaluation ?Patient Details ?Name: Frederick [name] ?MRN: [id number] ?DOB: April 01, 1961 ?Today's Date: 04/01/2022 ? ? ?History of Present Illness 61 y.o. male brought in 03/19/22 due to seizure activity and possible increased weakness on left with incr slurred speech. CT head no acute findings. Intubated 4/15, extubated 4/24. PMH seizure, alcohol abuse, pulmonary diverticulosis, hypertension; admitted in February for seizures due to medication noncompliance adn hypoglycemia  ? ?Clinical Impression ?  ? From home however unable to give PLOF due to impaired cognition. ([facility] after recent hospitalization). Pt dysarthric and  lethargic however would respond to questions appropriately at times. Currently total A +2 for all bed mobility and ADL. Pt with significant posterior push EOB and will need Maximove at this time to mobilize to chair. Recommend SNF for rehab. Acute Ot to follow.  ?   ? ?Recommendations for follow up therapy are one component of a multi-disciplinary discharge planning process, led by the attending physician.  Recommendations may be updated based on patient status, additional functional criteria and insurance authorization.  ? ?Follow Up Recommendations ? Skilled nursing-short term rehab (<3 hours/day)  ?  ?Assistance Recommended at Discharge Frequent or constant Supervision/Assistance  ?Patient can return home with the following Two people to help with walking and/or transfers;Two people to help with bathing/dressing/bathroom;Assistance with cooking/housework;Assistance with feeding;Direct supervision/assist for medications management;Direct supervision/assist for financial management;Assist for transportation;Help with stairs or ramp for entrance ? ?  ?Functional Status Assessment ? Patient has had a recent decline in their functional status and/or demonstrates limited ability to make significant improvements in function in a reasonable and predictable  amount of time  ?Equipment Recommendations ? None recommended by OT  ?  ?Recommendations for Other Services   ? ? ?  ?Precautions / Restrictions Precautions ?Precautions: Fall ?Precaution Comments: flexiseal; coretrack  ? ?  ? ?Mobility Bed Mobility ?Overal bed mobility: Needs Assistance ?Bed Mobility: Rolling, Sidelying to Sit ?Rolling: +2 for physical assistance, Total assist ?  ?  ?  ?  ?General bed mobility comments: Assist for all aspects of rolling. Attempted sidelying to sit but pt with rigidity into extension causing hips to slide too far forward putting pt at risk of sliding off of bed. Aborted attempt and returned to supine. ?  ? ?Transfers ?Overall transfer level:  (will need sky lift/maximove) ?  ?  ?  ?  ?  ?  ?  ?  ?  ?  ? ?  ?Balance Overall balance assessment: Needs assistance ?  ?Sitting balance-Leahy Scale: Zero ?  ?  ?  ?  ?  ?  ?  ?  ?  ?  ?  ?  ?  ?  ?  ?  ?   ? ?ADL either performed or assessed with clinical judgement  ? ?ADL Overall ADL's : Needs assistance/impaired ?  ?  ?  ?  ?  ?  ?  ?  ?  ?  ?  ?  ?  ?  ?  ?  ?  ?  ?  ?General ADL Comments: total A  ? ? ? ?Vision Baseline Vision/History:  (eyes cloased majority of session) ?   ?   ?Perception   ?  ?Praxis   ?  ? ?Pertinent Vitals/Pain Pain Assessment ?Pain Assessment: Faces ?Faces Pain Scale: Hurts little more ?Facial Expression: Grimacing ?Pain Location: generalized ?Pain Descriptors / Indicators: Discomfort, Grimacing ?Pain Intervention(s): Limited activity within patient's tolerance  ? ? ? ?Hand Dominance Right ?  ?  Extremity/Trunk Assessment Upper Extremity Assessment ?Upper Extremity Assessment: Generalized weakness;Difficult to assess due to impaired cognition (amputation digits R hand) ?  ?Lower Extremity Assessment ?Lower Extremity Assessment: Defer to PT evaluation ?  ?Cervical / Trunk Assessment ?Cervical / Trunk Assessment: Other exceptions (post bias; rigid) ?  ?Communication Communication ?Communication: Expressive difficulties  (dysarthric) ?  ?Cognition Arousal/Alertness: Lethargic ?Behavior During Therapy: Flat affect ?Overall Cognitive Status: No family/caregiver present to determine baseline cognitive functioning ?Area of Impairment: Orientation, Attention, Following commands, Safety/judgement, Problem solving, Awareness ?  ?  ?  ?  ?  ?  ?  ?  ?Orientation Level: Disoriented to, Place, Time, Situation ?Current Attention Level: Focused ?  ?Following Commands: Follows one step commands inconsistently, Follows one step commands with increased time ?Safety/Judgement: Decreased awareness of safety, Decreased awareness of deficits ?Awareness: Intellectual ?Problem Solving: Slow processing, Decreased initiation, Difficulty sequencing, Requires verbal cues, Requires tactile cues ?General Comments: Pt arouses briefly with verbal/tactile stimulation. Keeps eyes closed throughout except for intermittent brief eye opening to command ?  ?  ?General Comments    ? ?  ?Exercises   ?  ?Shoulder Instructions    ? ? ?Home Living Family/patient expects to be discharged to:: Private residence ?Living Arrangements: Spouse/significant other ?Available Help at Discharge: Available PRN/intermittently ?Type of Home: Apartment ?Home Access: Stairs to enter ?Entrance Stairs-Number of Steps: flight ?Entrance Stairs-Rails: Right ?Home Layout: One level ?  ?  ?  ?  ?Bathroom Toilet: Standard ?Bathroom Accessibility: No ?  ?Home Equipment: Agricultural consultant (2 wheels);Wheelchair - manual;Wheelchair - power;BSC/3in1 ?  ?  ?  ? ?  ?Prior Functioning/Environment Prior Level of Function : Patient poor historian/Family not available ?  ?  ?  ?  ?  ?  ?  ?  ?  ? ?  ?  ?OT Problem List: Decreased strength;Decreased range of motion;Impaired balance (sitting and/or standing);Impaired vision/perception;Decreased coordination;Decreased cognition;Decreased safety awareness;Decreased knowledge of use of DME or AE;Decreased knowledge of precautions;Pain ?  ?   ?OT  Treatment/Interventions: Self-care/ADL training;Therapeutic exercise;DME and/or AE instruction;Therapeutic activities;Cognitive remediation/compensation;Visual/perceptual remediation/compensation;Patient/family education;Balance training  ?  ?OT Goals(Current goals can be found in the care plan section) Acute Rehab OT Goals ?Patient Stated Goal: pt unable ?OT Goal Formulation: Patient unable to participate in goal setting ?Time For Goal Achievement: 04/15/22 ?Potential to Achieve Goals: Fair  ?OT Frequency: Min 2X/week ?  ? ?Co-evaluation PT/OT/SLP Co-Evaluation/Treatment: Yes ?Reason for Co-Treatment: Complexity of the patient's impairments (multi-system involvement);For patient/therapist safety ?PT goals addressed during session: Mobility/safety with mobility ?OT goals addressed during session: ADL's and self-care;Strengthening/ROM ?  ? ?  ?AM-PAC OT "6 Clicks" Daily Activity     ?Outcome Measure Help from another person eating meals?: Total ?Help from another person taking care of personal grooming?: Total ?Help from another person toileting, which includes using toliet, bedpan, or urinal?: Total ?Help from another person bathing (including washing, rinsing, drying)?: Total ?Help from another person to put on and taking off regular upper body clothing?: Total ?Help from another person to put on and taking off regular lower body clothing?: Total ?6 Click Score: 6 ?  ?End of Session Equipment Utilized During Treatment: Gait belt;Oxygen ?Nurse Communication: Mobility status;Need for lift equipment ? ?Activity Tolerance: Patient limited by lethargy;Patient limited by fatigue ?Patient left: in bed;with call bell/phone within reach;with bed alarm set;Other (comment) (modified chair position) ? ?OT Visit Diagnosis: Unsteadiness on feet (R26.81);Other abnormalities of gait and mobility (R26.89);Muscle weakness (generalized) (M62.81);Other symptoms and signs involving the nervous system (R29.898);Other symptoms  and signs  involving cognitive function;Pain ?Pain - part of body:  (generalized)  ?              ?Time: 1110-1140 ?OT Time Calculation (min): 30 min ?Charges:  OT General Charges ?$OT Visit: 1 Visit ?OT Evaluation ?$OT Eval Modera

## 2022-04-01 NOTE — TOC Progression Note (Signed)
Transition of Care (TOC) - Progression Note  ? ? ?Patient Details  ?Name: Frederick [name] ?MRN: [id number] ?Date of Birth: Sep 12, 1961 ? ?Transition of Care (TOC) CM/SW Contact  ?Beckie Busing, RN ?Phone Number:[phone] ? ?04/01/2022, 2:50 PM ? ?Clinical Narrative:    ?TOC continues to follow for progression and disposition needs.  We will continue to monitor patient advancement through interdisciplinary progression rounds. ? ? ?  ?  ? ?Expected Discharge Plan and Services ?  ?  ?  ?  ?  ?                ?  ?  ?  ?  ?  ?  ?  ?  ?  ?  ? ? ?Social Determinants of Health (SDOH) Interventions ?  ? ?Readmission Risk Interventions ? ?  01/12/2022  ?  3:25 PM  ?Readmission Risk Prevention Plan  ?Transportation Screening Complete  ?Medication Review Oceanographer) Referral to Pharmacy  ?PCP or Specialist appointment within 3-5 days of discharge Not Complete  ?PCP/Specialist Appt Not Complete comments Patient not ready for d/c  ?HRI or Home Care Consult Not Complete  ?SW Recovery Care/Counseling Consult Complete  ?Palliative Care Screening Not Applicable  ?Skilled Nursing Facility Complete  ? ? ?

## 2022-04-01 NOTE — Evaluation (Signed)
Physical Therapy Evaluation ?Patient Details ?Name: Frederick [name] ?MRN: [id number] ?DOB: 03/30/1961 ?Today's Date: 04/01/2022 ? ?History of Present Illness ? 61 y.o. male brought in 03/19/22 due to seizure activity and possible increased weakness on left with incr slurred speech. CT head no acute findings. Intubated 4/15, extubated 4/24. PMH seizure, alcohol abuse, pulmonary diverticulosis, hypertension; admitted in February for seizures due to medication noncompliance adn hypoglycemia  ?Clinical Impression ? Pt presents to PT with severely impaired mobility due to weakness, poor cognition, rigidity, and lethargy. Expect slow progress with mobility and recommend SNF at [place] for further therapy.    ?   ? ?Recommendations for follow up therapy are one component of a multi-disciplinary discharge planning process, led by the attending physician.  Recommendations may be updated based on patient status, additional functional criteria and insurance authorization. ? ?Follow Up Recommendations Skilled nursing-short term rehab (<3 hours/day) ? ?  ?Assistance Recommended at Discharge Frequent or constant Supervision/Assistance  ?Patient can return home with the following ? Two people to help with walking and/or transfers;Two people to help with bathing/dressing/bathroom;Assist for transportation;Direct supervision/assist for financial management;Direct supervision/assist for medications management;Assistance with cooking/housework;Help with stairs or ramp for entrance ? ?  ?Equipment Recommendations Other (comment);Hospital bed (hoyer lift)  ?Recommendations for Other Services ?    ?  ?Functional Status Assessment Patient has had a recent decline in their functional status and demonstrates the ability to make significant improvements in function in a reasonable and predictable amount of time.  ? ?  ?Precautions / Restrictions Precautions ?Precautions: Fall  ? ?  ? ?Mobility ? Bed Mobility ?Overal bed mobility: Needs Assistance ?Bed  Mobility: Rolling, Sidelying to Sit ?Rolling: +2 for physical assistance, Total assist ?  ?  ?  ?  ?General bed mobility comments: Assist for all aspects of rolling. Attempted sidelying to sit but pt with rigidity into extension causing hips to slide too far forward putting pt at risk of sliding off of bed. Aborted attempt and returned to supine. ?  ? ?Transfers ?  ?  ?  ?  ?  ?  ?  ?  ?  ?  ?  ? ?Ambulation/Gait ?  ?  ?  ?  ?  ?  ?  ?  ? ?Stairs ?  ?  ?  ?  ?  ? ?Wheelchair Mobility ?  ? ?Modified Rankin (Stroke Patients Only) ?  ? ?  ? ?Balance   ?  ?  ?  ?  ?  ?  ?  ?  ?  ?  ?  ?  ?  ?  ?  ?  ?  ?  ?   ? ? ? ?Pertinent Vitals/Pain Pain Assessment ?Pain Assessment: Faces ?Faces Pain Scale: No hurt  ? ? ?Home Living Family/patient expects to be discharged to:: Private residence ?Living Arrangements: Spouse/significant other ?Available Help at Discharge: Available PRN/intermittently ?Type of Home: Apartment ?Home Access: Stairs to enter ?Entrance Stairs-Rails: Right ?Entrance Stairs-Number of Steps: flight ?  ?Home Layout: One level ?Home Equipment: Agricultural consultant (2 wheels);Wheelchair - manual;Wheelchair - power;BSC/3in1 ?   ?  ?Prior Function Prior Level of Function : Patient poor historian/Family not available ?  ?  ?  ?  ?  ?  ?  ?  ?  ? ? ?Hand Dominance  ? Dominant Hand: Right ? ?  ?Extremity/Trunk Assessment  ? Upper Extremity Assessment ?Upper Extremity Assessment: Defer to OT evaluation ?  ? ?Lower Extremity Assessment ?Lower Extremity Assessment:  Difficult to assess due to impaired cognition (Pt assisting with active assistive range of motion but not following commands to attempt movement on his own) ?  ? ?   ?Communication  ? Communication: Expressive difficulties (dysarthric)  ?Cognition Arousal/Alertness: Lethargic ?Behavior During Therapy: Flat affect ?Overall Cognitive Status: No family/caregiver present to determine baseline cognitive functioning ?Area of Impairment: Orientation, Attention, Following  commands, Safety/judgement, Problem solving, Awareness ?  ?  ?  ?  ?  ?  ?  ?  ?Orientation Level: Disoriented to, Place, Time, Situation ?Current Attention Level: Focused ?  ?Following Commands: Follows one step commands inconsistently, Follows one step commands with increased time ?Safety/Judgement: Decreased awareness of safety, Decreased awareness of deficits ?Awareness: Intellectual ?Problem Solving: Slow processing, Decreased initiation, Difficulty sequencing, Requires verbal cues, Requires tactile cues ?General Comments: Pt arouses briefly with verbal/tactile stimulation. Keeps eyes closed throughout except for intermittent brief eye opening to command ?  ?  ? ?  ?General Comments   ? ?  ?Exercises    ? ?Assessment/Plan  ?  ?PT Assessment Patient needs continued PT services  ?PT Problem List Decreased strength;Decreased range of motion;Decreased activity tolerance;Decreased balance;Decreased mobility;Decreased cognition ? ?   ?  ?PT Treatment Interventions DME instruction;Gait training;Functional mobility training;Therapeutic activities;Therapeutic exercise;Balance training;Neuromuscular re-education;Cognitive remediation;Patient/family education   ? ?PT Goals (Current goals can be found in the Care Plan section)  ?Acute Rehab PT Goals ?Patient Stated Goal: Pt unable to state ?PT Goal Formulation: Patient unable to participate in goal setting ?Time For Goal Achievement: 04/15/22 ?Potential to Achieve Goals: Fair ? ?  ?Frequency Min 2X/week ?  ? ? ?Co-evaluation PT/OT/SLP Co-Evaluation/Treatment: Yes ?Reason for Co-Treatment: For patient/therapist safety ?PT goals addressed during session: Mobility/safety with mobility ?  ?  ? ? ?  ?AM-PAC PT "6 Clicks" Mobility  ?Outcome Measure Help needed turning from your back to your side while in a flat bed without using bedrails?: Total ?Help needed moving from lying on your back to sitting on the side of a flat bed without using bedrails?: Total ?Help needed moving to  and from a bed to a chair (including a wheelchair)?: Total ?Help needed standing up from a chair using your arms (e.g., wheelchair or bedside chair)?: Total ?Help needed to walk in hospital room?: Total ?Help needed climbing 3-5 steps with a railing? : Total ?6 Click Score: 6 ? ?  ?End of Session   ?Activity Tolerance: Patient limited by lethargy ?Patient left: in bed;Other (comment) (with OT present) ?Nurse Communication: Mobility status ?PT Visit Diagnosis: Other abnormalities of gait and mobility (R26.89);Difficulty in walking, not elsewhere classified (R26.2);Muscle weakness (generalized) (M62.81) ?  ? ?Time: 2778-2423 ?PT Time Calculation (min) (ACUTE ONLY): 22 min ? ? ?Charges:   PT Evaluation ?$PT Eval Moderate Complexity: 1 Mod ?  ?  ?   ? ? ?[facility] PT ?Acute Rehabilitation Services ?Office [phone] ? ? ?Angelina Ok [facility] ?04/01/2022, 3:18 PM ? ?

## 2022-04-02 DIAGNOSIS — I10 Essential (primary) hypertension: Secondary | ICD-10-CM | POA: Diagnosis not present

## 2022-04-02 DIAGNOSIS — G9341 Metabolic encephalopathy: Secondary | ICD-10-CM | POA: Diagnosis not present

## 2022-04-02 DIAGNOSIS — F1029 Alcohol dependence with unspecified alcohol-induced disorder: Secondary | ICD-10-CM | POA: Diagnosis not present

## 2022-04-02 DIAGNOSIS — R569 Unspecified convulsions: Secondary | ICD-10-CM | POA: Diagnosis not present

## 2022-04-02 LAB — COMPREHENSIVE METABOLIC PANEL
ALT: 28 U/L (ref 0–44)
AST: 27 U/L (ref 15–41)
Albumin: 2.8 g/dL — ABNORMAL LOW (ref 3.5–5.0)
Alkaline Phosphatase: 107 U/L (ref 38–126)
Anion gap: 7 (ref 5–15)
BUN: 24 mg/dL — ABNORMAL HIGH (ref 8–23)
CO2: 28 mmol/L (ref 22–32)
Calcium: 9.6 mg/dL (ref 8.9–10.3)
Chloride: 101 mmol/L (ref 98–111)
Creatinine, Ser: 0.79 mg/dL (ref 0.61–1.24)
GFR, Estimated: >60 mL/min (ref >60)
Glucose, Bld: 125 mg/dL — ABNORMAL HIGH (ref 70–99)
Potassium: 4 mmol/L (ref 3.5–5.1)
Sodium: 136 mmol/L (ref 135–145)
Total Bilirubin: 0.3 mg/dL (ref 0.3–1.2)
Total Protein: 7.5 g/dL (ref 6.5–8.1)

## 2022-04-02 LAB — CBC
HCT: 35.3 % — ABNORMAL LOW (ref 39.0–52.0)
Hemoglobin: 11.6 g/dL — ABNORMAL LOW (ref 13.0–17.0)
MCH: 26.9 pg (ref 26.0–34.0)
MCHC: 32.9 g/dL (ref 30.0–36.0)
MCV: 81.7 fL (ref 80.0–100.0)
Platelets: 378 10*3/uL (ref 150–400)
RBC: 4.32 MIL/uL (ref 4.22–5.81)
RDW: 16.9 % — ABNORMAL HIGH (ref 11.5–15.5)
WBC: 9.5 10*3/uL (ref 4.0–10.5)
nRBC: 0 % (ref 0.0–0.2)

## 2022-04-02 LAB — GLUCOSE, CAPILLARY
Glucose-Capillary: 134 mg/dL — ABNORMAL HIGH (ref 70–99)
Glucose-Capillary: 145 mg/dL — ABNORMAL HIGH (ref 70–99)
Glucose-Capillary: 122 mg/dL — ABNORMAL HIGH (ref 70–99)
Glucose-Capillary: 127 mg/dL — ABNORMAL HIGH (ref 70–99)
Glucose-Capillary: 115 mg/dL — ABNORMAL HIGH (ref 70–99)
Glucose-Capillary: 129 mg/dL — ABNORMAL HIGH (ref 70–99)

## 2022-04-02 LAB — MAGNESIUM: Magnesium: 1.5 mg/dL — ABNORMAL LOW (ref 1.7–2.4)

## 2022-04-02 MED ORDER — NICOTINE 14 MG/24HR TD PT24
14.0000 mg | MEDICATED_PATCH | Freq: Every day | TRANSDERMAL | Status: DC
  Administered 2022-04-02 – 2022-04-10 (×9): 14 mg via TRANSDERMAL
  Filled 2022-04-02 (×9): qty 1

## 2022-04-02 MED ORDER — MAGNESIUM SULFATE 4 GM/100ML IV SOLN
4.0000 g | Freq: Once | INTRAVENOUS | Status: AC
  Administered 2022-04-02: 4 g via INTRAVENOUS
  Filled 2022-04-02: qty 100

## 2022-04-02 NOTE — Progress Notes (Signed)
Speech Language Pathology Treatment: Dysphagia  ?Patient Details ?Name: Frederick [name] ?MRN: [id number] ?DOB: September 12, 1961 ?Today's Date: 04/02/2022 ?Time: 8242-3536 ?SLP Time Calculation (min) (ACUTE ONLY): 11 min ? ?Assessment / Plan / Recommendation ?Clinical Impression ? SLP called back to room by RN as pt had become more alert. Although he was more alert, his mentation still impacts his ability to orally accept boluses. With heavy assist from SLP he did get small amounts of water and ice to swallow. Oral holding is observed and there is coughing associated with thin liquids. He does not have as much coughing with ice chips, but they do require a little more prompting to get him to chew and swallow. Pt has audible wetness that increases with trials but SLP was able to suction more out with yankauer after providing even a little bit of moisture. Recommend that he remain NPO but would offer him 1-2 pieces of ice after oral care is completed if he is alert enough at the time. He likely needs to start swallowing even in small increments to work on clearing secretions suspected to be in his pharynx before he cant start to try more POs. Discussed with RN.  ?  ?HPI HPI: Pt is a 61 yo male presenting 4/14 due to seizure activity and possible increased weakness on L with increased slurred speech. CT Head 4/14 and CXR 4/15 with no acute findings. Initially evaluated by SLP 4/15 and kept NPO, but was then intubated later that day; extubated 4/24. Pt was recently admitted in February for status epilepticus, stroke, requiring intubation. MBS that admission showed a mild oropharyngeal dysphagia with effortful mastication and reduced lingual propulsion, intermittent penetration that cleared spontaneously. He also had suspected osteophytes that appeared to contribute to mild amounts of backflow that re-enter the pharynx and the barium tablet was not observed to exit the esophagus. Dys 3 diet and thin liquids were recommended. PMH  also includes: CVA, seizure, heavy alcohol abuse, asthma, HTN, tobacco abuse, PE, pulmonary TB (treated) ?  ?   ?SLP Plan ? Continue with current plan of care ? ?  ?  ?Recommendations for follow up therapy are one component of a multi-disciplinary discharge planning process, led by the attending physician.  Recommendations may be updated based on patient status, additional functional criteria and insurance authorization. ?  ? ?Recommendations  ?Diet recommendations: NPO (1-2 ice chips immediately following oral care if alert enough) ?Medication Administration: Via alternative means  ?   ?    ?   ? ? ? ? Oral Care Recommendations: Oral care QID;Staff/trained caregiver to provide oral care ?Follow Up Recommendations: Skilled nursing-short term rehab (<3 hours/day) ?Assistance recommended at discharge: Frequent or constant Supervision/Assistance ?SLP Visit Diagnosis: Dysphagia, unspecified (R13.10) ?Plan: Continue with current plan of care ? ? ? ? ?  ?  ? ? ?Mahala Menghini., M.A. CCC-SLP ?Acute Rehabilitation Services ?Office [phone] ? ?Secure chat preferred ? ? ?04/02/2022, 1:50 PM ?

## 2022-04-02 NOTE — Progress Notes (Signed)
? ?[facility] ?PROGRESS NOTE ? ? ?Frederick [name] FIE:[id number] DOB: Mar 01, 1961 DOA: 03/19/2022  13 ?DOS: the patient was seen and examined on 04/02/2022 ? ?PCP: [facility] ? ?Brief History and Hospital Course: 61 y.o. male with history of seizure, alcohol abuse, pulmonary diverticulosis, hypertension who was admitted in February for status epilepticus in the setting of patient being noncompliant with his medication had to be intubated at the time and also had a stroke. He was found to have generalized tonic-clonic activity by patient's family and was brought to the ER.  Initially admitted to the floor.  However patient decompensated and had to be transferred to the ICU.  Had to be intubated again.  Neurology was consulted.  Patient was stabilized.  Subsequently extubated. ? ?Consultants: Neurology.  Critical care medicine. ? ?Procedures: Intubation followed by extubation ? ? ? ?Subjective: ?Somnolent this morning but easily arousable.  Remains distracted and confused.  Per nursing staff he has been asking to smoke cigarettes. ? ? ? ?Assessment/Plan: ? ?History of seizures/status epilepticus ?History of EtOH abuse and medical noncompliance ?Initially in status epilepticus.  Had to be intubated and subsequently extubated. ?Neurology recommends continuing Vimpat, Keppra and perampanel. ?Phenobarbital being weaned.  Neurology plans to discontinue perampanel next week if he remains seizure-free. ?Stable from a seizure standpoint. ? ?Acute metabolic encephalopathy ?Altered mental status thought to be secondary to seizure as well as medications.  Mentation is gradually improving.  He was oriented to city and ER yesterday.  Continue current medications for now including Seroquel.   ? ?Acute respiratory failure related to status epilepticus ?Presumed aspiration pneumonia ?Urinary tract infection ?Seems to be stable from a respiratory standpoint.  Patient was extubated. ?Completed course of antibiotics.   ?   ?History of EtOH abuse/tobacco abuse ?Being weaned off of phenobarbital.  Continue with thiamine folate multivitamins.  Apparently he has craving cigarettes.  Initiate nicotine patch. ? ?Nutrition/oropharyngeal dysphagia ?Due to encephalopathy patient is unable to safely take oral diet.  He has a nasogastric feeding tube.  This is to be continued for now. ?Speech therapy to continue to follow.  Still not safe to take by mouth.  Continue tube feedings for now. ? ?Hypertension ?Chronic. Became hypotensive with precedex but that resolved.  ?Currently noted to be on carvedilol.  Blood pressure is reasonably well controlled. ? 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  Being continued on aspirin.  Noted to be on cilostazol. ? ?Electrolyte derangement ?Mild hyponatremia/hypomagnesemia ?Stable.  Continue to monitor. ?Potassium was 4.0 today magnesium was 1.5 and will be supplemented. ? ?Normocytic anemia ?Stable.  No evidence of overt bleeding.   ? ?Previous history of DVT/PE ?Has completed course of anticoagulation. ?  ?Moderate protein calorie malnutrition ?Nutrition Problem: Moderate Malnutrition ?Etiology: social / environmental circumstances (alcohol abuse) ?Signs/Symptoms: mild fat depletion, mild muscle depletion, moderate muscle depletion ?Interventions: Tube feeding ? ? ?DVT Prophylaxis: On Lovenox ?Code Status: Full code ?Family Communication: No family at bedside ?Disposition Plan: Remains encephalopathic.  Not ready for discharge.  PT and OT evaluation.  Speech therapy evaluation to continue. ? ?Status is: Inpatient ?Remains inpatient appropriate because: Acute encephalopathy, seizures ? ? ? ? ?Medications: Scheduled: ? aspirin  300 mg Rectal Daily  ? Or  ? aspirin  325 mg Per Tube Daily  ? carvedilol  25 mg Per Tube BID WC  ? chlorhexidine gluconate (MEDLINE KIT)  15 mL Mouth Rinse BID  ? Chlorhexidine Gluconate Cloth  6 each Topical Daily  ?  cilostazol  100 mg Per Tube  BID  ? enoxaparin (LOVENOX) injection  40 mg Subcutaneous Q24H  ? feeding supplement (PROSource TF)  45 mL Per Tube BID  ? folic acid  1 mg Per Tube Daily  ? lacosamide  150 mg Per Tube BID  ? levETIRAcetam  1,500 mg Per Tube BID  ? multivitamin with minerals  1 tablet Per Tube Daily  ? perampanel  2 mg Per Tube QHS  ? QUEtiapine  25 mg Per Tube QHS  ? thiamine  100 mg Per Tube Daily  ? ?Continuous: ? sodium chloride 10 mL/hr at 03/31/22 0600  ? feeding supplement (JEVITY 1.5 CAL/FIBER) 1,000 mL (03/30/22 1353)  ? ?SKA:[id number] **OR** acetaminophen (TYLENOL) oral liquid 160 mg/5 mL **OR** acetaminophen, docusate, hydrALAZINE, polyethylene glycol ? ?Antibiotics: ?Anti-infectives (From admission, onward)  ? ? Start     Dose/Rate Route Frequency Ordered Stop  ? 03/24/22 1000  amoxicillin (AMOXIL) capsule 500 mg       ? 500 mg Per Tube Every 8 hours 03/24/22 0851 03/27/22 2121  ? 03/22/22 1000  Ampicillin-Sulbactam (UNASYN) 3 g in sodium chloride 0.9 % 100 mL IVPB  Status:  Discontinued       ? 3 g ?200 mL/hr over 30 Minutes Intravenous Every 6 hours 03/22/22 0842 03/24/22 0851  ? 03/20/22 1700  cefTRIAXone (ROCEPHIN) 2 g in sodium chloride 0.9 % 100 mL IVPB  Status:  Discontinued       ? 2 g ?200 mL/hr over 30 Minutes Intravenous Every 24 hours 03/20/22 1611 03/22/22 0842  ? ?  ? ? ?Objective: ? ?Vital Signs ? ?Vitals:  ? 04/01/22 2305 04/02/22 0312 04/02/22 0500 04/02/22 0745  ?BP: 126/67 127/71  126/69  ?Pulse:  88  83  ?Resp: '20 20  16  ' ?Temp: (!) 97.4 ?F (36.3 ?C) 98.1 ?F (36.7 ?C)  97.6 ?F (36.4 ?C)  ?TempSrc: Axillary Oral  Axillary  ?SpO2:  98%  98%  ?Weight:   77.9 kg   ?Height:      ? ? ?Intake/Output Summary (Last 24 hours) at 04/02/2022 0943 ?Last data filed at 04/02/2022 0800 ?Gross per 24 hour  ?Intake 1170 ml  ?Output 250 ml  ?Net 920 ml  ? ? ?Filed Weights  ? 03/31/22 0500 04/01/22 0441 04/02/22 0500  ?Weight: 76.9 kg 77.7 kg 77.9 kg  ? ? ?General appearance: Somnolent this morning but easily  arousable.  Remains distracted ?Resp: Clear to auscultation bilaterally.  Normal effort ?Cardio: S1-S2 is normal regular.  No S3-S4.  No rubs murmurs or bruit ?GI: Abdomen is soft.  Nontender nondistended.  Bowel sounds are present normal.  No masses organomegaly ?Extremities: No edema.   ?Neurologic: Distracted and confused.  No focal deficits. ? ? ?Lab Results: ? ?Data Reviewed: I have personally reviewed labs and imaging study reports ? ?CBC: ?Recent Labs  ?Lab 03/28/22 ?0224 03/29/22 ?5974 03/29/22 ?2048 03/30/22 ?1638 03/31/22 ?0143 04/02/22 ?0415  ?WBC 9.6 8.4  --  7.7 8.7 9.5  ?NEUTROABS  --   --   --   --  4.7  --   ?HGB 11.3* 11.5* 10.9* 11.6* 11.6* 11.6*  ?HCT 34.7* 36.0* 32.0* 35.5* 35.3* 35.3*  ?MCV 82.6 82.2  --  82.6 81.7 81.7  ?PLT 257 250  --  294 340 378  ? ? ? ?Basic Metabolic Panel: ?Recent Labs  ?Lab 03/28/22 ?0224 03/29/22 ?4536 03/29/22 ?2048 03/30/22 ?4680 03/31/22 ?0143 04/02/22 ?0415  ?NA 133* 134* 132* 133* 134* 136  ?  K 4.5 4.2 4.1 4.0 4.2 4.0  ?CL 99 101  --  102 101 101  ?CO2 25 24  --  '26 26 28  ' ?GLUCOSE 117* 116*  --  108* 135* 125*  ?BUN 26* 27*  --  20 20 24*  ?CREATININE 0.71 0.69  --  0.77 0.73 0.79  ?CALCIUM 9.4 9.4  --  9.5 9.4 9.6  ?MG 1.7 1.8  --  1.8 1.7 1.5*  ? ? ? ?GFR: ?Estimated Creatinine Clearance: 106.8 mL/min (by C-G formula based on SCr of 0.79 mg/dL). ? ?CBG: ?Recent Labs  ?Lab 04/01/22 ?1515 04/01/22 ?2112 04/01/22 ?2306 04/02/22 ?1222 04/02/22 ?4114  ?GLUCAP 89 113* 88 134* 145*  ? ? ? ? ?Radiology Studies: ?DG Abd Portable 1V ? ?Result Date: 03/31/2022 ?CLINICAL DATA:  Feeding tube placement. EXAM: PORTABLE ABDOMEN - 1 VIEW COMPARISON:  03/30/2022. FINDINGS: Feeding tube terminates in the gastric antrum. Mild gaseous distension of colon. IMPRESSION: Feeding tube terminates in gastric antrum. Electronically Signed   By: Lorin Picket M.D.   On: 03/31/2022 10:41   ? ? ? ? LOS: 13 days  ? ?Bonnielee Haff ? ?[facility] ?Pager on [email or web address] ? ?04/02/2022,  9:43 AM ? ? ?

## 2022-04-02 NOTE — TOC Initial Note (Signed)
Transition of Care (TOC) - Initial/Assessment Note  ? ? ?Patient Details  ?Name: Frederick [name] ?MRN: [id number] ?Date of Birth: 1961/06/12 ? ?Transition of Care (TOC) CM/SW Contact:    ?[place], LCSW ?Phone Number: ?04/02/2022, 2:26 PM ? ?Clinical Narrative:                 ? ?CSW called pt's significant other to discuss SNF recommendation. No answer; CSW left voicemail requesting return call.  ? ?Expected Discharge Plan: [place] ?Barriers to Discharge: Continued Medical Work up, SNF Pending bed offer, Insurance Authorization ? ? ?Patient Goals and CMS Choice ?  ?  ?  ? ?Expected Discharge Plan and Services ?Expected Discharge Plan: [place] ?  ?  ?  ?Living arrangements for the past 2 months: Apartment ?                ?  ?  ?  ?  ?  ?  ?  ?  ?  ?  ? ?Prior Living Arrangements/Services ?Living arrangements for the past 2 months: Apartment ?  ?  ?       ?  ?  ?  ?  ? ?Activities of Daily Living ?  ?ADL Screening (condition at time of admission) ?Patient's cognitive ability adequate to safely complete daily activities?: No ?Is the patient deaf or have difficulty hearing?: No ?Does the patient have difficulty seeing, even when wearing glasses/contacts?: No ?Does the patient have difficulty concentrating, remembering, or making decisions?: Yes ?Patient able to express need for assistance with ADLs?: Yes ?Does the patient have difficulty dressing or bathing?: Yes ?Independently performs ADLs?: No ?Communication: Dependent ?Is this a change from baseline?: Change from baseline, expected to last <3 days ?Dressing (OT): Dependent ?Is this a change from baseline?: Change from baseline, expected to last <3days ?Grooming: Dependent ?Is this a change from baseline?: Change from baseline, expected to last <3 days ?Feeding: Dependent ?Is this a change from baseline?: Change from baseline, expected to last <3 days ?Bathing: Dependent ?Is this a change from baseline?: Change from baseline,  expected to last <3 days ?Toileting: Dependent ?Is this a change from baseline?: Change from baseline, expected to last <3 days ?In/Out Bed: Dependent ?Is this a change from baseline?: Change from baseline, expected to last <3 days ?Walks in Home: Dependent ?Is this a change from baseline?: Change from baseline, expected to last <3 days ?Does the patient have difficulty walking or climbing stairs?: Yes ?Weakness of Legs: Both ?Weakness of Arms/Hands: Both ? ?Permission Sought/Granted ?  ?  ?   ?   ?   ?   ? ?Emotional Assessment ?  ?  ?  ?  ?  ?  ? ?Admission diagnosis:  Focal seizure ([place]) [R56.9] ?Seizure ([place]) [R56.9] ?Hypoglycemia [E16.2] ?Patient Active Problem List  ? Diagnosis Date Noted  ? HTN (hypertension) 03/20/2022  ? Hypoglycemia   ? Seizure ([place]) 03/19/2022  ? Cerebral thrombosis with cerebral infarction 01/18/2022  ? Acute metabolic encephalopathy [id number]  ? Protein-calorie malnutrition, severe 01/07/2022  ? Streptococcal bacteremia 09/09/2021  ? Alcohol withdrawal seizure with complication, with unspecified complication ([place]) [id number]  ? CKD (chronic kidney disease), stage III ([place]) 08/19/2021  ? Dysphagia 08/19/2021  ? Malnutrition of moderate degree 08/12/2021  ? Bacteremia due to group B Streptococcus   ? Acute systolic CHF (congestive heart failure) ([place])   ? Seizures ([place]) 08/10/2021  ? Status epilepticus ([place])   ? Status post surgical amputation of finger  of right hand 06/23/2021  ? 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 Marijuana abuse 07/22/2017  ? Hyponatremia 12/03/2015  ? Left rib fracture 11/29/2015  ? Microcytic anemia 11/29/2015  ? Cigarette smoker 11/29/2015  ? Moderate protein-calorie malnutrition ([place]) 11/29/2015  ? Unintentional weight loss 11/29/2015  ? History of tuberculosis 11/28/2015  ? Cavitary lesion of lung 11/28/2015  ? SBO (small bowel obstruction) ([place]) 03/10/2013  ? Abdominal pain, diffuse 03/10/2013  ? Nausea and vomiting 03/10/2013  ?  Fracture of fibula, proximal 01/21/2013  ? Closed fracture of left distal distal tibia 01/21/2013  ? EtOH dependence ([place])   ? Fibula fracture   ? Hernia   ? ?PCP:  [facility], [facility] ?Pharmacy:   ?[id number] - [place] [place] - [address] ?[place] ?[place] [address] ?Phone: [phone] Fax: [phone] ? ?[facility] ?[address] ?[place] [address] ?Phone: [phone] Fax: [phone] ? ? ? ? ?Social Determinants of Health (SDOH) Interventions ?  ? ?Readmission Risk Interventions ? ?  01/12/2022  ?  3:25 PM  ?Readmission Risk Prevention Plan  ?Transportation Screening Complete  ?Medication Review Press photographer) Referral to Pharmacy  ?PCP or Specialist appointment within 3-5 days of discharge Not Complete  ?PCP/Specialist Appt Not Complete comments Patient not ready for d/c  ?[place] or [place] Not Complete  ?SW Recovery Care/Counseling Consult Complete  ?Palliative Care Screening Not Applicable  ?Skilled Nursing Facility Complete  ? ? ? ?

## 2022-04-02 NOTE — Progress Notes (Signed)
Speech Language Pathology Treatment: Dysphagia  ?Patient Details ?Name: Frederick [name] ?MRN: [id number] ?DOB: 1961/12/03 ?Today's Date: 04/02/2022 ?Time: 4967-5916 ?SLP Time Calculation (min) (ACUTE ONLY): 13 min ? ?Assessment / Plan / Recommendation ?Clinical Impression ? Pt's mentation remains a barrier to PO intake despite Max cues and attempts at hand-over-hand assist to self-feed. He was sleeping upon SLP arrival and could be stimulated enough to verbalize briefly back to SLP or make facial expressions, but not enough to follow commands or initiate consumption of POs. SLP did offer a little bit of water, siphoned via straw, and although this did elicit a swallow response, he is not alert and aware enough to begin a PO diet. Would continue to utilize temporary, alternative means of nutrition. May wish to consider palliative care involvement as well, particularly if mentation does not begin to improve.  ?  ?HPI HPI: Pt is a 61 yo male presenting 4/14 due to seizure activity and possible increased weakness on L with increased slurred speech. CT Head 4/14 and CXR 4/15 with no acute findings. Initially evaluated by SLP 4/15 and kept NPO, but was then intubated later that day; extubated 4/24. Pt was recently admitted in February for status epilepticus, stroke, requiring intubation. MBS that admission showed a mild oropharyngeal dysphagia with effortful mastication and reduced lingual propulsion, intermittent penetration that cleared spontaneously. He also had suspected osteophytes that appeared to contribute to mild amounts of backflow that re-enter the pharynx and the barium tablet was not observed to exit the esophagus. Dys 3 diet and thin liquids were recommended. PMH also includes: CVA, seizure, heavy alcohol abuse, asthma, HTN, tobacco abuse, PE, pulmonary TB (treated) ?  ?   ?SLP Plan ? Continue with current plan of care ? ?  ?  ?Recommendations for follow up therapy are one component of a multi-disciplinary  discharge planning process, led by the attending physician.  Recommendations may be updated based on patient status, additional functional criteria and insurance authorization. ?  ? ?Recommendations  ?Diet recommendations: NPO ?Medication Administration: Via alternative means  ?   ?    ?   ? ? ? ? Oral Care Recommendations: Oral care QID;Staff/trained caregiver to provide oral care ?Follow Up Recommendations: Skilled nursing-short term rehab (<3 hours/day) ?Assistance recommended at discharge: Frequent or constant Supervision/Assistance ?SLP Visit Diagnosis: Dysphagia, unspecified (R13.10) ?Plan: Continue with current plan of care ? ? ? ? ?  ?  ? ? ?Mahala Menghini., M.A. CCC-SLP ?Acute Rehabilitation Services ?Office [phone] ? ?Secure chat preferred ? ? ?04/02/2022, 10:43 AM ?

## 2022-04-03 DIAGNOSIS — R569 Unspecified convulsions: Secondary | ICD-10-CM | POA: Diagnosis not present

## 2022-04-03 DIAGNOSIS — I10 Essential (primary) hypertension: Secondary | ICD-10-CM | POA: Diagnosis not present

## 2022-04-03 DIAGNOSIS — G9341 Metabolic encephalopathy: Secondary | ICD-10-CM | POA: Diagnosis not present

## 2022-04-03 DIAGNOSIS — F1029 Alcohol dependence with unspecified alcohol-induced disorder: Secondary | ICD-10-CM | POA: Diagnosis not present

## 2022-04-03 LAB — GLUCOSE, CAPILLARY
Glucose-Capillary: 123 mg/dL — ABNORMAL HIGH (ref 70–99)
Glucose-Capillary: 128 mg/dL — ABNORMAL HIGH (ref 70–99)
Glucose-Capillary: 112 mg/dL — ABNORMAL HIGH (ref 70–99)
Glucose-Capillary: 107 mg/dL — ABNORMAL HIGH (ref 70–99)
Glucose-Capillary: 114 mg/dL — ABNORMAL HIGH (ref 70–99)
Glucose-Capillary: 115 mg/dL — ABNORMAL HIGH (ref 70–99)

## 2022-04-03 MED ORDER — CARVEDILOL 12.5 MG PO TABS
12.5000 mg | ORAL_TABLET | Freq: Two times a day (BID) | ORAL | Status: DC
  Administered 2022-04-04 – 2022-04-10 (×14): 12.5 mg
  Filled 2022-04-03 (×14): qty 1

## 2022-04-03 NOTE — Progress Notes (Signed)
? ?[facility] ?PROGRESS NOTE ? ? ?Frederick [name] LDJ:[id number] DOB: 09/09/1961 DOA: 03/19/2022  14 ?DOS: the patient was seen and examined on 04/03/2022 ? ?PCP: [facility], [facility] ? ?Brief History and Hospital Course: 61 y.o. male with history of seizure, alcohol abuse, pulmonary diverticulosis, hypertension who was admitted in February for status epilepticus in the setting of patient being noncompliant with his medication had to be intubated at the time and also had a stroke. He was found to have generalized tonic-clonic activity by patient's family and was brought to the ER.  Initially admitted to the floor.  However patient decompensated and had to be transferred to the ICU.  Had to be intubated again.  Neurology was consulted.  Patient was stabilized.  Subsequently extubated. ? ?Consultants: Neurology.  Critical care medicine. ? ?Procedures: Intubation followed by extubation ? ? ? ?Subjective: ?Patient is awake and alert this morning.  Somewhat confused.  Does follow commands and answer questions at times.   ? ? ? ?Assessment/Plan: ? ?History of seizures/status epilepticus ?History of EtOH abuse and medical noncompliance ?Initially in status epilepticus.  Had to be intubated and subsequently extubated. ?Neurology recommends continuing Vimpat, Keppra and perampanel. ?Phenobarbital being weaned.  Neurology plans to discontinue perampanel next week if he remains seizure-free. ?Stable from a seizure standpoint. ? ?Acute metabolic encephalopathy ?Altered mental status thought to be secondary to seizure as well as medications.  ?Mentation seems to be gradually improving.  Still confused at times.  Continue current medications including Seroquel for ? ?Acute respiratory failure related to status epilepticus ?Presumed aspiration pneumonia ?Urinary tract infection ?Seems to be stable from a respiratory standpoint.  Patient was extubated. ?Completed course of antibiotics.   ?  ?History of EtOH abuse/tobacco  abuse ?Being weaned off of phenobarbital.  Continue with thiamine folate multivitamins.  Apparently he was craving cigarettes.  Nicotine patch was ordered. ? ?Nutrition/oropharyngeal dysphagia ?Due to encephalopathy patient is unable to safely take oral diet.  He has a nasogastric feeding tube.  This is to be continued for now. ?Speech therapy continues to follow. ? ?Hypertension ?Chronic. Became hypotensive with precedex but that resolved.  ?Currently noted to be on carvedilol.  Blood pressure is reasonably well controlled. ? 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  Being continued on aspirin.  Noted to be on cilostazol. ? ?Electrolyte derangement ?Mild hyponatremia/hypomagnesemia ?Monitor electrolytes periodically.  Magnesium was repleted on 4/28.  We will recheck labs tomorrow.   ? ?Normocytic anemia ?Stable.  No evidence of overt bleeding.   ? ?Previous history of DVT/PE ?Has completed course of anticoagulation. ?  ?Moderate protein calorie malnutrition ?Nutrition Problem: Moderate Malnutrition ?Etiology: social / environmental circumstances (alcohol abuse) ?Signs/Symptoms: mild fat depletion, mild muscle depletion, moderate muscle depletion ?Interventions: Tube feeding ? ? ?DVT Prophylaxis: On Lovenox ?Code Status: Full code ?Family Communication: No family at bedside ?Disposition Plan: Remains encephalopathic.  Not ready for discharge.  PT and OT evaluation.  Speech therapy evaluation to continue. ? ?Status is: Inpatient ?Remains inpatient appropriate because: Acute encephalopathy, seizures ? ? ? ? ?Medications: Scheduled: ? aspirin  300 mg Rectal Daily  ? Or  ? aspirin  325 mg Per Tube Daily  ? carvedilol  25 mg Per Tube BID WC  ? chlorhexidine gluconate (MEDLINE KIT)  15 mL Mouth Rinse BID  ? Chlorhexidine Gluconate Cloth  6 each Topical Daily  ? cilostazol  100 mg Per Tube BID  ? enoxaparin (LOVENOX) injection  40 mg Subcutaneous Q24H  ?  feeding supplement (PROSource  TF)  45 mL Per Tube BID  ? folic acid  1 mg Per Tube Daily  ? lacosamide  150 mg Per Tube BID  ? levETIRAcetam  1,500 mg Per Tube BID  ? multivitamin with minerals  1 tablet Per Tube Daily  ? nicotine  14 mg Transdermal Daily  ? perampanel  2 mg Per Tube QHS  ? QUEtiapine  25 mg Per Tube QHS  ? thiamine  100 mg Per Tube Daily  ? ?Continuous: ? sodium chloride 10 mL/hr at 03/31/22 0600  ? feeding supplement (JEVITY 1.5 CAL/FIBER) 1,000 mL (04/03/22 0526)  ? ?KLK:JZPHXTAVWPVXY **OR** acetaminophen (TYLENOL) oral liquid 160 mg/5 mL **OR** acetaminophen, docusate, hydrALAZINE, polyethylene glycol ? ?Antibiotics: ?Anti-infectives (From admission, onward)  ? ? Start     Dose/Rate Route Frequency Ordered Stop  ? 03/24/22 1000  amoxicillin (AMOXIL) capsule 500 mg       ? 500 mg Per Tube Every 8 hours 03/24/22 0851 03/27/22 2121  ? 03/22/22 1000  Ampicillin-Sulbactam (UNASYN) 3 g in sodium chloride 0.9 % 100 mL IVPB  Status:  Discontinued       ? 3 g ?200 mL/hr over 30 Minutes Intravenous Every 6 hours 03/22/22 0842 03/24/22 0851  ? 03/20/22 1700  cefTRIAXone (ROCEPHIN) 2 g in sodium chloride 0.9 % 100 mL IVPB  Status:  Discontinued       ? 2 g ?200 mL/hr over 30 Minutes Intravenous Every 24 hours 03/20/22 1611 03/22/22 0842  ? ?  ? ? ?Objective: ? ?Vital Signs ? ?Vitals:  ? 04/02/22 1927 04/02/22 2308 04/03/22 0259 04/03/22 0743  ?BP: 127/69 119/79 123/68 128/69  ?Pulse: 87 96 92 87  ?Resp: _0 ?Temp: 98 ?F (36.7 ?C) 98.5 ?F (36.9 ?C) 98.7 ?F (37.1 ?C) 98 ?F (36.7 ?C)  ?TempSrc: Oral Oral Oral Oral  ?SpO2: 100% 98% 100% 97%  ?Weight:      ?Height:      ? ? ?Intake/Output Summary (Last 24 hours) at 04/03/2022 8016 ?Last data filed at 04/03/2022 5537 ?Gross per 24 hour  ?Intake 480 ml  ?Output 1364 ml  ?Net -884 ml  ? ? ?Filed Weights  ? 03/31/22 0500 04/01/22 0441 04/02/22 0500  ?Weight: 76.9 kg 77.7 kg 77.9 kg  ? ? ?General appearance: Awake alert.  In no distress.  Confused ?Resp: Clear to auscultation bilaterally.   Normal effort ?Cardio: S1-S2 is normal regular.  No S3-S4.  No rubs murmurs or bruit ?GI: Abdomen is soft.  Nontender nondistended.  Bowel sounds are present normal.  No masses organomegaly ?Extremities: No edema.   ?Neurologic: Awake alert.  Disoriented but oriented to city and year.  No focal neurological deficits.  ? ? ?Lab Results: ? ?Data Reviewed: I have personally reviewed labs and imaging study reports ? ?CBC: ?Recent Labs  ?Lab 03/28/22 ?0224 03/29/22 ?4827 03/29/22 ?2048 03/30/22 ?0786 03/31/22 ?0143 04/02/22 ?0415  ?WBC 9.6 8.4  --  7.7 8.7 9.5  ?NEUTROABS  --   --   --   --  4.7  --   ?HGB 11.3* 11.5* 10.9* 11.6* 11.6* 11.6*  ?HCT 34.7* 36.0* 32.0* 35.5* 35.3* 35.3*  ?MCV 82.6 82.2  --  82.6 81.7 81.7  ?PLT 257 250  --  294 340 378  ? ? ? ?Basic Metabolic Panel: ?Recent Labs  ?Lab 03/28/22 ?0224 03/29/22 ?7544 03/29/22 ?2048 03/30/22 ?9201 03/31/22 ?0143 04/02/22 ?0415  ?NA 133* 134* 132* 133* 134* 136  ?K 4.5  4.2 4.1 4.0 4.2 4.0  ?CL 99 101  --  102 101 101  ?CO2 25 24  --  _0 ?GLUCOSE 117* 116*  --  108* 135* 125*  ?BUN 26* 27*  --  20 20 24*  ?CREATININE 0.71 0.69  --  0.77 0.73 0.79  ?CALCIUM 9.4 9.4  --  9.5 9.4 9.6  ?MG 1.7 1.8  --  1.8 1.7 1.5*  ? ? ? ?GFR: ?Estimated Creatinine Clearance: 106.8 mL/min (by C-G formula based on SCr of 0.79 mg/dL). ? ?CBG: ?Recent Labs  ?Lab 04/02/22 ?1507 04/02/22 ?1952 04/02/22 ?2312 04/03/22 ?5916 04/03/22 ?0745  ?GLUCAP 127* 115* 129* 123* 128*  ? ? ? ? ?Radiology Studies: ?No results found. ? ? ? ? LOS: 14 days  ? ?Bonnielee Haff ? ?[facility] ?Pager on [email or web address] ? ?04/03/2022, 9:27 AM ? ? ?

## 2022-04-04 DIAGNOSIS — I10 Essential (primary) hypertension: Secondary | ICD-10-CM | POA: Diagnosis not present

## 2022-04-04 DIAGNOSIS — R569 Unspecified convulsions: Secondary | ICD-10-CM | POA: Diagnosis not present

## 2022-04-04 DIAGNOSIS — G9341 Metabolic encephalopathy: Secondary | ICD-10-CM | POA: Diagnosis not present

## 2022-04-04 DIAGNOSIS — F1029 Alcohol dependence with unspecified alcohol-induced disorder: Secondary | ICD-10-CM | POA: Diagnosis not present

## 2022-04-04 LAB — BASIC METABOLIC PANEL
Anion gap: 7 (ref 5–15)
BUN: 30 mg/dL — ABNORMAL HIGH (ref 8–23)
CO2: 27 mmol/L (ref 22–32)
Calcium: 9.5 mg/dL (ref 8.9–10.3)
Chloride: 100 mmol/L (ref 98–111)
Creatinine, Ser: 0.86 mg/dL (ref 0.61–1.24)
GFR, Estimated: >60 mL/min (ref >60)
Glucose, Bld: 122 mg/dL — ABNORMAL HIGH (ref 70–99)
Potassium: 4.4 mmol/L (ref 3.5–5.1)
Sodium: 134 mmol/L — ABNORMAL LOW (ref 135–145)

## 2022-04-04 LAB — CBC
HCT: 36.3 % — ABNORMAL LOW (ref 39.0–52.0)
Hemoglobin: 11.5 g/dL — ABNORMAL LOW (ref 13.0–17.0)
MCH: 26.1 pg (ref 26.0–34.0)
MCHC: 31.7 g/dL (ref 30.0–36.0)
MCV: 82.3 fL (ref 80.0–100.0)
Platelets: 427 10*3/uL — ABNORMAL HIGH (ref 150–400)
RBC: 4.41 MIL/uL (ref 4.22–5.81)
RDW: 17.3 % — ABNORMAL HIGH (ref 11.5–15.5)
WBC: 11.2 10*3/uL — ABNORMAL HIGH (ref 4.0–10.5)
nRBC: 0 % (ref 0.0–0.2)

## 2022-04-04 LAB — GLUCOSE, CAPILLARY
Glucose-Capillary: 114 mg/dL — ABNORMAL HIGH (ref 70–99)
Glucose-Capillary: 120 mg/dL — ABNORMAL HIGH (ref 70–99)
Glucose-Capillary: 127 mg/dL — ABNORMAL HIGH (ref 70–99)
Glucose-Capillary: 135 mg/dL — ABNORMAL HIGH (ref 70–99)
Glucose-Capillary: 132 mg/dL — ABNORMAL HIGH (ref 70–99)
Glucose-Capillary: 145 mg/dL — ABNORMAL HIGH (ref 70–99)

## 2022-04-04 LAB — MAGNESIUM: Magnesium: 1.7 mg/dL (ref 1.7–2.4)

## 2022-04-04 MED ORDER — MAGNESIUM SULFATE 2 GM/50ML IV SOLN
2.0000 g | Freq: Once | INTRAVENOUS | Status: AC
  Administered 2022-04-04: 2 g via INTRAVENOUS
  Filled 2022-04-04: qty 50

## 2022-04-04 MED ORDER — LOPERAMIDE HCL 1 MG/7.5ML PO SUSP
4.0000 mg | Freq: Three times a day (TID) | ORAL | Status: AC
  Administered 2022-04-04 – 2022-04-05 (×3): 4 mg
  Filled 2022-04-04 (×3): qty 30

## 2022-04-04 MED ORDER — LOPERAMIDE HCL 1 MG/7.5ML PO SUSP
2.0000 mg | Freq: Three times a day (TID) | ORAL | Status: DC | PRN
  Filled 2022-04-04: qty 15

## 2022-04-04 MED ORDER — LOPERAMIDE HCL 2 MG PO CAPS
4.0000 mg | ORAL_CAPSULE | Freq: Three times a day (TID) | ORAL | Status: DC | PRN
Start: 2022-04-04 — End: 2022-04-04

## 2022-04-04 NOTE — NC FL2 (Signed)
? MEDICAID FL2 LEVEL OF CARE SCREENING TOOL  ?  ? ?IDENTIFICATION  ?Patient Name: ?Frederick [name] Birthdate: 05/05/1961 Sex: male Admission Date (Current Location): ?03/19/2022  ?[place] and [place] Number: ? [place] ?  Facility and Address:  ?[facility] [place], [place] [address], [place], [place] [address] ?     Provider Number: ?[id number]  ?Attending Physician Name and Address:  ?Bonnielee Haff, MD ? Relative Name and Phone Number:  ?Barrington Ellison, significant other, [phone] ?   ?Current Level of Care: ?Hospital Recommended Level of Care: ?[place] Prior Approval Number: ?  ? ?Date Approved/Denied: ?  PASRR Number: ?[id number] A ? ?Discharge Plan: ?Home ?  ? ?Current Diagnoses: ?Patient Active Problem List  ? Diagnosis Date Noted  ? HTN (hypertension) 03/20/2022  ? Hypoglycemia   ? Seizure ([place]) 03/19/2022  ? Cerebral thrombosis with cerebral infarction 01/18/2022  ? Acute metabolic encephalopathy 35/45/6256  ? Protein-calorie malnutrition, severe 01/07/2022  ? Streptococcal bacteremia 09/09/2021  ? Alcohol withdrawal seizure with complication, with unspecified complication ([place]) 38/93/7342  ? CKD (chronic kidney disease), stage III ([place]) 08/19/2021  ? Dysphagia 08/19/2021  ? Malnutrition of moderate degree 08/12/2021  ? Bacteremia due to group B Streptococcus   ? Acute systolic CHF (congestive heart failure) ([place])   ? Seizures ([place]) 08/10/2021  ? Status epilepticus ([place])   ? 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 Osteomyelitis of index finger of right hand ([place]) 05/28/2021  ? Marijuana abuse 07/22/2017  ? Hyponatremia 12/03/2015  ? Left rib fracture 11/29/2015  ? Microcytic anemia 11/29/2015  ? Cigarette smoker 11/29/2015  ? Moderate protein-calorie malnutrition ([place]) 11/29/2015  ? Unintentional weight loss 11/29/2015  ? History of tuberculosis 11/28/2015  ? Cavitary lesion of lung 11/28/2015  ? SBO (small bowel obstruction) ([place])  03/10/2013  ? Abdominal pain, diffuse 03/10/2013  ? Nausea and vomiting 03/10/2013  ? Fracture of fibula, proximal 01/21/2013  ? Closed fracture of left distal distal tibia 01/21/2013  ? EtOH dependence ([place])   ? Fibula fracture   ? Hernia   ? ? ?Orientation RESPIRATION BLADDER Height & Weight   ?  ?Self, Situation ? Normal External catheter Weight: 166 lb 14.2 oz (75.7 kg) ?Height:  _0  (185.4 cm)  ?BEHAVIORAL SYMPTOMS/MOOD NEUROLOGICAL BOWEL NUTRITION STATUS  ?  Convulsions/Seizures Incontinent Diet (see DC summary)  ?AMBULATORY STATUS COMMUNICATION OF NEEDS Skin   ?Extensive Assist Verbally Normal ?  ?  ?  ?    ?     ?     ? ? ?Personal Care Assistance Level of Assistance  ?Bathing, Feeding, Dressing Bathing Assistance: Maximum assistance ?Feeding assistance: Limited assistance ?Dressing Assistance: Maximum assistance ?   ? ?Functional Limitations Info  ?    ?  ?   ? ? ?SPECIAL CARE FACTORS FREQUENCY  ?OT (By licensed OT), PT (By licensed PT)   ?  ?PT Frequency: 5x weekly ?OT Frequency: 5x weekly ?  ?  ?  ?   ? ? ?Contractures Contractures Info: Not present  ? ? ?Additional Factors Info  ?Code Status, Allergies Code Status Info: full ?Allergies Info: nkda ?  ?  ?  ?   ? ?Current Medications (04/04/2022):  This is the current hospital active medication list ?Current Facility-Administered Medications  ?Medication Dose Route Frequency Provider Last Rate Last Admin  ? 0.9 %  sodium chloride infusion  250 mL Intravenous Continuous Margaretha Seeds, MD   Stopped at 04/03/22 1035  ?  acetaminophen (TYLENOL) tablet 650 mg  650 mg Oral Q4H PRN Rise Patience, MD   650 mg at 03/25/22 2127  ? Or  ? acetaminophen (TYLENOL) 160 MG/5ML solution 650 mg  650 mg Per Tube Q4H PRN Rise Patience, MD      ? Or  ? acetaminophen (TYLENOL) suppository 650 mg  650 mg Rectal Q4H PRN Rise Patience, MD      ? aspirin suppository 300 mg  300 mg Rectal Daily Arnell Asal, NP      ? Or  ? aspirin tablet 325 mg  325 mg  Per Tube Daily Jennelle Human B, NP   325 mg at 04/04/22 6962  ? carvedilol (COREG) tablet 12.5 mg  12.5 mg Per Tube BID WC Bonnielee Haff, MD   12.5 mg at 04/04/22 9528  ? chlorhexidine gluconate (MEDLINE KIT) (PERIDEX) 0.12 % solution 15 mL  15 mL Mouth Rinse BID Jennelle Human B, NP   15 mL at 04/04/22 0932  ? Chlorhexidine Gluconate Cloth 2 % PADS 6 each  6 each Topical Daily Eugenie Filler, MD   6 each at 04/04/22 0932  ? cilostazol (PLETAL) tablet 100 mg  100 mg Per Tube BID Idamae Schuller, MD   100 mg at 04/04/22 4132  ? docusate (COLACE) 50 MG/5ML liquid 100 mg  100 mg Per Tube BID PRN Idamae Schuller, MD   100 mg at 04/03/22 1115  ? enoxaparin (LOVENOX) injection 40 mg  40 mg Subcutaneous Q24H Rise Patience, MD   40 mg at 04/04/22 4401  ? feeding supplement (JEVITY 1.5 CAL/FIBER) liquid 1,000 mL  1,000 mL Per Tube Continuous Olalere, Adewale A, MD 60 mL/hr at 04/03/22 2218 1,000 mL at 04/03/22 2218  ? feeding supplement (PROSource TF) liquid 45 mL  45 mL Per Tube BID Olalere, Adewale A, MD   45 mL at 04/04/22 0918  ? folic acid (FOLVITE) tablet 1 mg  1 mg Per Tube Daily Jennelle Human B, NP   1 mg at 04/04/22 0272  ? hydrALAZINE (APRESOLINE) injection 10 mg  10 mg Intravenous Q6H PRN Eugenie Filler, MD   10 mg at 03/25/22 5366  ? lacosamide (VIMPAT) tablet 150 mg  150 mg Per Tube BID Bonnielee Haff, MD   150 mg at 04/04/22 4403  ? levETIRAcetam (KEPPRA) 100 MG/ML solution 1,500 mg  1,500 mg Per Tube BID Bonnielee Haff, MD   1,500 mg at 04/04/22 4742  ? magnesium sulfate IVPB 2 g 50 mL  2 g Intravenous Once Bonnielee Haff, MD 50 mL/hr at 04/04/22 0922 2 g at 04/04/22 5956  ? multivitamin with minerals tablet 1 tablet  1 tablet Per Tube Daily Jennelle Human B, NP   1 tablet at 04/04/22 3875  ? nicotine (NICODERM CQ - dosed in mg/24 hours) patch 14 mg  14 mg Transdermal Daily Bonnielee Haff, MD   14 mg at 04/04/22 0930  ? perampanel [facility]) tablet 2 mg  2 mg Per Tube QHS Lora Havens, MD    2 mg at 04/03/22 2126  ? polyethylene glycol (MIRALAX / GLYCOLAX) packet 17 g  17 g Per Tube Daily PRN Idamae Schuller, MD      ? QUEtiapine (SEROQUEL) tablet 25 mg  25 mg Per Tube QHS Olalere, Adewale A, MD   25 mg at 04/03/22 2126  ? thiamine tablet 100 mg  100 mg Per Tube Daily Pauletta Browns, RPH   100 mg at 04/04/22 6433  ? ? ? ?  Discharge Medications: ?Please see discharge summary for a list of discharge medications. ? ?Relevant Imaging Results: ? ?Relevant Lab Results: ? ? ?Additional Information ?SSN: [id number]. No covid vaccines in system. ? ?Alfredia Ferguson, LCSW ? ? ? ? ?

## 2022-04-04 NOTE — Progress Notes (Addendum)
? ?[facility] ?PROGRESS NOTE ? ? ?Frederick [name] OZD:[id number] DOB: 1960-12-20 DOA: 03/19/2022  15 ?DOS: the patient was seen and examined on 04/04/2022 ? ?PCP: Pa, [facility] ? ?Brief History and Hospital Course: 61 y.o. male with history of seizure, alcohol abuse, pulmonary diverticulosis, hypertension who was admitted in February for status epilepticus in the setting of patient being noncompliant with his medication had to be intubated at the time and also had a stroke. He was found to have generalized tonic-clonic activity by patient's family and was brought to the ER.  Initially admitted to the floor.  However patient decompensated and had to be transferred to the ICU.  Had to be intubated again.  Neurology was consulted.  Patient was stabilized.  Subsequently extubated. ? ?Consultants: Neurology.  Critical care medicine. ? ?Procedures: Intubation followed by extubation ? ? ? ?Subjective: ?Patient awake and alert.  Confused.  Answering some questions appropriately and others not so much.  Does not appear to be in any discomfort. ? ? ?Assessment/Plan: ? ?History of seizures/status epilepticus ?History of EtOH abuse and medical noncompliance ?Initially in status epilepticus.  Had to be intubated and subsequently extubated. ?Neurology recommends continuing Vimpat, Keppra and perampanel. ?Phenobarbital being weaned.  Neurology plans to discontinue perampanel soon if he remains seizure-free. ?Stable from a seizure standpoint. ? ?Acute metabolic encephalopathy ?Altered mental status thought to be secondary to seizure as well as medications.  ?Mentation has been very slow to improve.  Still very confused at times.  Continue current medications including Seroquel. ? ?Acute respiratory failure related to status epilepticus ?Presumed aspiration pneumonia ?Urinary tract infection ?Seems to be stable from a respiratory standpoint.  Patient was extubated. ?Completed course of antibiotics.  WBC noted to be slightly  higher today.  He is afebrile.  We will recheck in a day or 2. ?  ?History of EtOH abuse/tobacco abuse ?Weaned off of phenobarbital.  Continue with thiamine folate multivitamins.  Apparently he was craving cigarettes.  Nicotine patch was ordered. ? ?Nutrition/oropharyngeal dysphagia ?Due to encephalopathy patient is unable to safely take oral diet.  He has a nasogastric feeding tube.  This is to be continued for now. ?Speech therapy continues to follow. ? ?Hypertension ?Chronic. Became hypotensive with precedex but that resolved.  ?Currently noted to be on carvedilol.  Blood pressure is reasonably well controlled. ? 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  Being continued on aspirin.  Noted to be on cilostazol. ? ?Electrolyte derangement ?Mild hyponatremia/hypomagnesemia ?Monitor electrolytes periodically.  Magnesium 1.7 this morning and will be repleted.    ? ?Normocytic anemia ?Stable.  No evidence of overt bleeding.   ? ?Diarrhea ?Rectal tube noted.  Diarrhea could be due to tube feedings.  Could consider giving him Imodium. ? ?Previous history of DVT/PE ?Has completed course of anticoagulation. ?  ?Moderate protein calorie malnutrition ?Nutrition Problem: Moderate Malnutrition ?Etiology: social / environmental circumstances (alcohol abuse) ?Signs/Symptoms: mild fat depletion, mild muscle depletion, moderate muscle depletion ?Interventions: Tube feeding ? ? ?DVT Prophylaxis: On Lovenox ?Code Status: Full code ?Family Communication: No family at bedside ?Disposition Plan: Remains encephalopathic.  Not ready for discharge.  PT and OT evaluation.  Speech therapy evaluation to continue.  Eventually plan is to go to skilled nursing facility when medically improved. ? ?Status is: Inpatient ?Remains inpatient appropriate because: Acute encephalopathy, seizures ? ? ? ? ?Medications: Scheduled: ? aspirin  300 mg Rectal Daily  ? Or  ? aspirin  325 mg Per Tube Daily  ?  carvedilol   12.5 mg Per Tube BID WC  ? chlorhexidine gluconate (MEDLINE KIT)  15 mL Mouth Rinse BID  ? Chlorhexidine Gluconate Cloth  6 each Topical Daily  ? cilostazol  100 mg Per Tube BID  ? enoxaparin (LOVENOX) injection  40 mg Subcutaneous Q24H  ? feeding supplement (PROSource TF)  45 mL Per Tube BID  ? folic acid  1 mg Per Tube Daily  ? lacosamide  150 mg Per Tube BID  ? levETIRAcetam  1,500 mg Per Tube BID  ? multivitamin with minerals  1 tablet Per Tube Daily  ? nicotine  14 mg Transdermal Daily  ? perampanel  2 mg Per Tube QHS  ? QUEtiapine  25 mg Per Tube QHS  ? thiamine  100 mg Per Tube Daily  ? ?Continuous: ? sodium chloride Stopped (04/03/22 1035)  ? feeding supplement (JEVITY 1.5 CAL/FIBER) 1,000 mL (04/03/22 2218)  ? magnesium sulfate bolus IVPB 2 g (04/04/22 7026)  ? ?VZC:HYIFOYDXAJOIN **OR** acetaminophen (TYLENOL) oral liquid 160 mg/5 mL **OR** acetaminophen, docusate, hydrALAZINE, polyethylene glycol ? ?Antibiotics: ?Anti-infectives (From admission, onward)  ? ? Start     Dose/Rate Route Frequency Ordered Stop  ? 03/24/22 1000  amoxicillin (AMOXIL) capsule 500 mg       ? 500 mg Per Tube Every 8 hours 03/24/22 0851 03/27/22 2121  ? 03/22/22 1000  Ampicillin-Sulbactam (UNASYN) 3 g in sodium chloride 0.9 % 100 mL IVPB  Status:  Discontinued       ? 3 g ?200 mL/hr over 30 Minutes Intravenous Every 6 hours 03/22/22 0842 03/24/22 0851  ? 03/20/22 1700  cefTRIAXone (ROCEPHIN) 2 g in sodium chloride 0.9 % 100 mL IVPB  Status:  Discontinued       ? 2 g ?200 mL/hr over 30 Minutes Intravenous Every 24 hours 03/20/22 1611 03/22/22 0842  ? ?  ? ? ?Objective: ? ?Vital Signs ? ?Vitals:  ? 04/04/22 8676 04/04/22 0407 04/04/22 7209 04/04/22 0750  ?BP: 117/65   (!) 145/70  ?Pulse: 91 98  96  ?Resp: (!) _0 ?Temp: (!) 97.4 ?F (36.3 ?C)   99.4 ?F (37.4 ?C)  ?TempSrc: Oral   Axillary  ?SpO2: 94% 95%  96%  ?Weight:   75.7 kg   ?Height:      ? ? ?Intake/Output Summary (Last 24 hours) at 04/04/2022 1015 ?Last data filed at  04/03/2022 2314 ?Gross per 24 hour  ?Intake 878.83 ml  ?Output 300 ml  ?Net 578.83 ml  ? ? ?Filed Weights  ? 04/01/22 0441 04/02/22 0500 04/04/22 0412  ?Weight: 77.7 kg 77.9 kg 75.7 kg  ? ? ?General appearance: Awake alert.  In no distress.  Remains confused ?Resp: Clear to auscultation bilaterally.  Normal effort ?Cardio: S1-S2 is normal regular.  No S3-S4.  No rubs murmurs or bruit ?GI: Abdomen is soft.  Nontender nondistended.  Bowel sounds are present normal.  No masses organomegaly ?Extremities: No edema.  Moving all of his extremities.  Physical deconditioning is noted. ?Neurologic: Alert.  Oriented to year and city.  No obvious focal deficits. ? ? ?Lab Results: ? ?Data Reviewed: I have personally reviewed labs and imaging study reports ? ?CBC: ?Recent Labs  ?Lab 03/29/22 ?4709 03/29/22 ?2048 03/30/22 ?6283 03/31/22 ?0143 04/02/22 ?6629 04/04/22 ?0145  ?WBC 8.4  --  7.7 8.7 9.5 11.2*  ?NEUTROABS  --   --   --  4.7  --   --   ?HGB 11.5* 10.9* 11.6* 11.6* 11.6*  11.5*  ?HCT 36.0* 32.0* 35.5* 35.3* 35.3* 36.3*  ?MCV 82.2  --  82.6 81.7 81.7 82.3  ?PLT 250  --  294 340 378 427*  ? ? ? ?Basic Metabolic Panel: ?Recent Labs  ?Lab 03/29/22 ?1282 03/29/22 ?2048 03/30/22 ?0813 03/31/22 ?0143 04/02/22 ?8871 04/04/22 ?0145  ?NA 134* 132* 133* 134* 136 134*  ?K 4.2 4.1 4.0 4.2 4.0 4.4  ?CL 101  --  102 101 101 100  ?CO2 24  --  _0 ?GLUCOSE 116*  --  108* 135* 125* 122*  ?BUN 27*  --  20 20 24* 30*  ?CREATININE 0.69  --  0.77 0.73 0.79 0.86  ?CALCIUM 9.4  --  9.5 9.4 9.6 9.5  ?MG 1.8  --  1.8 1.7 1.5* 1.7  ? ? ? ?GFR: ?Estimated Creatinine Clearance: 96.6 mL/min (by C-G formula based on SCr of 0.86 mg/dL). ? ?CBG: ?Recent Labs  ?Lab 04/03/22 ?1550 04/03/22 ?1935 04/03/22 ?2317 04/04/22 ?0321 04/04/22 ?9597  ?GLUCAP 107* 114* 115* 114* 120*  ? ? ? ? ?Radiology Studies: ?No results found. ? ? ? ? LOS: 15 days  ? ?Bonnielee Haff ? ?[facility] ?Pager on [email or web address] ? ?04/04/2022, 10:15 AM ? ? ?

## 2022-04-04 NOTE — TOC Progression Note (Signed)
Transition of Care (TOC) - Progression Note  ? ? ?Patient Details  ?Name: Frederick [name] ?MRN: [id number] ?Date of Birth: [birth year] ? ?Transition of Care (TOC) CM/SW Contact  ?Windle Guard, LCSW ?Phone Number: ?04/04/2022, 9:26 AM ? ?Clinical Narrative:    ?CSW attempted to reach significant other after prior unsuccessful attempts. Significant other answered and CSW spoke with her regarding SNF and noted she and the patient would be open as he's been before. Family expressed preference for [facility]. CSW will initiate the SNF work-up and follow-up when bed offers are made.  ? ? ?Expected Discharge Plan: Skilled Nursing Facility ?Barriers to Discharge: Continued Medical Work up, SNF Pending bed offer, Insurance Authorization ? ?Expected Discharge Plan and Services ?Expected Discharge Plan: Skilled Nursing Facility ?  ?  ?  ?Living arrangements for the past 2 months: Apartment ?                ?  ?  ?  ?  ?  ?  ?  ?  ?  ?  ? ? ?Social Determinants of Health (SDOH) Interventions ?  ? ?Readmission Risk Interventions ? ?  01/12/2022  ?  3:25 PM  ?Readmission Risk Prevention Plan  ?Transportation Screening Complete  ?Medication Review Oceanographer) Referral to Pharmacy  ?PCP or Specialist appointment within 3-5 days of discharge Not Complete  ?PCP/Specialist Appt Not Complete comments Patient not ready for d/c  ?HRI or Home Care Consult Not Complete  ?SW Recovery Care/Counseling Consult Complete  ?Palliative Care Screening Not Applicable  ?Skilled Nursing Facility Complete  ? ? ?

## 2022-04-05 ENCOUNTER — Inpatient Hospital Stay: Payer: Commercial Managed Care - HMO | Admitting: Neurology

## 2022-04-05 ENCOUNTER — Inpatient Hospital Stay (HOSPITAL_COMMUNITY): Payer: Medicare Other

## 2022-04-05 DIAGNOSIS — R569 Unspecified convulsions: Secondary | ICD-10-CM | POA: Diagnosis not present

## 2022-04-05 DIAGNOSIS — I10 Essential (primary) hypertension: Secondary | ICD-10-CM | POA: Diagnosis not present

## 2022-04-05 DIAGNOSIS — G9341 Metabolic encephalopathy: Secondary | ICD-10-CM | POA: Diagnosis not present

## 2022-04-05 DIAGNOSIS — R059 Cough, unspecified: Secondary | ICD-10-CM

## 2022-04-05 DIAGNOSIS — F1029 Alcohol dependence with unspecified alcohol-induced disorder: Secondary | ICD-10-CM | POA: Diagnosis not present

## 2022-04-05 LAB — URINALYSIS, ROUTINE W REFLEX MICROSCOPIC
Bilirubin Urine: NEGATIVE
Glucose, UA: NEGATIVE mg/dL
Hgb urine dipstick: NEGATIVE
Ketones, ur: NEGATIVE mg/dL
Leukocytes,Ua: NEGATIVE
Nitrite: NEGATIVE
Protein, ur: NEGATIVE mg/dL
Specific Gravity, Urine: 1.025 (ref 1.005–1.030)
pH: 6 (ref 5.0–8.0)

## 2022-04-05 LAB — GLUCOSE, CAPILLARY
Glucose-Capillary: 118 mg/dL — ABNORMAL HIGH (ref 70–99)
Glucose-Capillary: 134 mg/dL — ABNORMAL HIGH (ref 70–99)
Glucose-Capillary: 136 mg/dL — ABNORMAL HIGH (ref 70–99)
Glucose-Capillary: 142 mg/dL — ABNORMAL HIGH (ref 70–99)
Glucose-Capillary: 148 mg/dL — ABNORMAL HIGH (ref 70–99)
Glucose-Capillary: 162 mg/dL — ABNORMAL HIGH (ref 70–99)

## 2022-04-05 NOTE — Progress Notes (Signed)
Speech Language Pathology Treatment: Dysphagia  ?Patient Details ?Name: Frederick [name] ?MRN: [id number] ?DOB: 1961/02/02 ?Today's Date: 04/05/2022 ?Time: 5009-3818 ?SLP Time Calculation (min) (ACUTE ONLY): 18 min ? ?Assessment / Plan / Recommendation ?Clinical Impression ? Pt seen for dysphagia tx f/u for PO readiness; pt with wet, congested vocal quality prior to PO trials and low vocal intensity noted during verbalizations with decreased intelligibility.  Breakfast tray noted within room and nursing staff contacted.  Nursing tech stated pt did not eat food on tray and "spit out eggs."  Tray placed in room by accident by food services per tech report.  Oral care provided prior to intake with SLP with decreased oral manipulation, oral holding and delayed swallow initiation observed with ice chips and tsp of thin liquid with mod verbal/tactile cues provided to orally manipulate and swallow hard/efficiently by SLP.   Pt noted to have multiple swallows and delayed cough/throat clearing during trial.  Pt was min lethargic, but cooperative during PO trials.  Recommend continuing PO readiness with ST, but for pt to remain NPO and continue TF for nutrition/hydration purposes.  May consider providing ice chips after aggressive oral care (when pt alert) to facilitate swallow function as pt exhibiting oral/pharyngeal weakness overall.   ?  ?HPI HPI: 61 y.o. male with history of seizure, alcohol abuse, pulmonary diverticulosis, hypertension who was admitted in February 2 months ago for status epilepticus in the setting of patient being noncompliant with his medication had to be intubated at the time and also had a stroke was found to have generalized tonic-clonic activity by patient's family and was brought to the ER on 03/20/22.  Pt previously placed on D3/thin liquid diet, but currently NPO d/t lethargy; has Cortrak; ST f/u for PO readiness. ?  ?   ?SLP Plan ? Continue with current plan of care ? ?  ?  ?Recommendations for follow  up therapy are one component of a multi-disciplinary discharge planning process, led by the attending physician.  Recommendations may be updated based on patient status, additional functional criteria and insurance authorization. ?  ? ?Recommendations  ?Diet recommendations: NPO ?Medication Administration: Via alternative means  ?   ?    ?   ? ? ? ? Oral Care Recommendations: Oral care QID ?Follow Up Recommendations: Skilled nursing-short term rehab (<3 hours/day) ?Assistance recommended at discharge: Intermittent Supervision/Assistance ?SLP Visit Diagnosis: Dysphagia, oropharyngeal phase (R13.12) ?Plan: Continue with current plan of care ? ? ? ? ?  ?  ? ? ?Tressie Stalker, M.S., CCC-SLP ? ?04/05/2022, 10:45 AM ?

## 2022-04-05 NOTE — Progress Notes (Signed)
Occupational Therapy Treatment ?Patient Details ?Name: Frederick [name] ?MRN: [id number] ?DOB: [birth year] ?Today's Date: 04/05/2022 ? ? ?History of present illness 61 y.o. male brought in 03/19/22 due to seizure activity and possible increased weakness on left with incr slurred speech. CT head no acute findings. Intubated 4/15, extubated 4/24. PMH seizure, alcohol abuse, pulmonary diverticulosis, hypertension; admitted in February for seizures due to medication noncompliance adn hypoglycemia ?  ?OT comments ? Pt making gradual progress towards OT goals this session. Pt continues to present with decreased level of arousal, impaired activity tolerance, generalized deconditioning, and impaired cognition. Session focus on bed level ADLs as no +2 available. Pt currently requires total A for all aspects of bed mobility and ADLs. Recommend maxi move lift to recliner to work on further progressing functional mobility. Pt would continue to benefit from skilled occupational therapy while admitted and after d/c to address the below listed limitations in order to improve overall functional mobility and facilitate independence with BADL participation. DC plan remains appropriate, will follow acutely per POC.  ? ?  ? ?Recommendations for follow up therapy are one component of a multi-disciplinary discharge planning process, led by the attending physician.  Recommendations may be updated based on patient status, additional functional criteria and insurance authorization. ?   ?Follow Up Recommendations ? Skilled nursing-short term rehab (<3 hours/day)  ?  ?Assistance Recommended at Discharge Frequent or constant Supervision/Assistance  ?Patient can return home with the following ? Two people to help with walking and/or transfers;Two people to help with bathing/dressing/bathroom;Assistance with cooking/housework;Assistance with feeding;Direct supervision/assist for medications management;Direct supervision/assist for financial  management;Assist for transportation;Help with stairs or ramp for entrance ?  ?Equipment Recommendations ? None recommended by OT  ?  ?Recommendations for Other Services   ? ?  ?Precautions / Restrictions Precautions ?Precautions: Fall ?Precaution Comments: flexiseal; cortrack, hand mits ?Restrictions ?Weight Bearing Restrictions: No  ? ? ?  ? ?Mobility Bed Mobility ?  ?Bed Mobility: Rolling ?Rolling: Total assist ?  ?  ?  ?  ?General bed mobility comments: total A to roll R<>L for bathing tasks ?  ? ?Transfers ?  ?  ?  ?  ?  ?  ?  ?  ?  ?General transfer comment: deffered as no +2 available ?  ?  ?Balance   ?  ?  ?  ?  ?  ?  ?  ?  ?  ?  ?  ?  ?  ?  ?  ?  ?  ?  ?   ? ?ADL either performed or assessed with clinical judgement  ? ?ADL Overall ADL's : Needs assistance/impaired ?  ?  ?  ?  ?Upper Body Bathing: Total assistance;Bed level ?  ?Lower Body Bathing: Total assistance;Bed level ?  ?Upper Body Dressing : Total assistance;Bed level ?Upper Body Dressing Details (indicate cue type and reason): don new gown ?  ?  ?  ?Toilet Transfer Details (indicate cue type and reason): deferred d/t lack of +2 ?  ?  ?  ?  ?  ?General ADL Comments: no +2 available, session limited to bed level ADLs, total A for all aspects of bathing and dressing, no initiation noted ?  ? 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  ?Lower Extremity Assessment ?Lower Extremity Assessment: Defer to PT evaluation ?  ?Cervical / Trunk Assessment ?Cervical / Trunk Assessment:  (rigid) ?  ? ?  Vision   ?Additional Comments: unable to assess secondary to cog ?  ?Perception   ?  ?Praxis   ?  ? ?Cognition Arousal/Alertness: Lethargic, Awake/alert (moments of wakefulness and moments of lethargy) ?Behavior During Therapy: Flat affect ?Overall Cognitive Status: No family/caregiver present to determine baseline  cognitive functioning ?Area of Impairment: Orientation, Attention, Memory, Following commands, Safety/judgement, Awareness, Problem solving ?  ?  ?  ?  ?  ?  ?  ?  ?Orientation Level: Disoriented to, Place, Time, Situation ?Current Attention Level: Focused ?Memory: Decreased short-term memory, Decreased recall of precautions ?Following Commands: Follows one step commands inconsistently ?Safety/Judgement: Decreased awareness of safety, Decreased awareness of deficits ?Awareness: Intellectual ?Problem Solving: Slow processing, Decreased initiation, Difficulty sequencing, Requires verbal cues, Requires tactile cues ?General Comments: pt speaking nonsensically most of session, keepign eyes closed and following no commands during session ?  ?  ?   ?Exercises   ? ?  ?Shoulder Instructions   ? ? ?  ?General Comments no skin issues noted during bathing tasks, would need +2 maximove for OOB, on RA during session dropping briefly to 89% on RA but rebounds quickly with rest breaks  ? ? ?Pertinent Vitals/ Pain       Pain Assessment ?Pain Assessment: Faces ?Faces Pain Scale: Hurts a little bit ?Pain Location: generalized ?Pain Descriptors / Indicators: Discomfort, Grimacing ?Pain Intervention(s): Limited activity within patient's tolerance, Monitored during session, Repositioned ? ?Home Living   ?  ?  ?  ?  ?  ?  ?  ?  ?  ?  ?  ?  ?  ?  ?  ?  ?  ?  ? ?  ?Prior Functioning/Environment    ?  ?  ?  ?   ? ?Frequency ?    ? ? ? ? ?  ?Progress Toward Goals ? ?OT Goals(current goals can now be found in the care plan section) ? Progress towards OT goals: Not progressing toward goals - comment (decreased level of arousal and command following) ? ?Acute Rehab OT Goals ?OT Goal Formulation: Patient unable to participate in goal setting ?Time For Goal Achievement: 04/15/22 ?Potential to Achieve Goals: Fair  ?Plan Discharge plan remains appropriate;Frequency remains appropriate   ? ?Co-evaluation ? ? ?   ?  ?  ?  ?  ? ?  ?AM-PAC OT "6  Clicks" Daily Activity     ?Outcome Measure ? ? Help from another person eating meals?: Total (NPO) ?Help from another person taking care of personal grooming?: Total ?Help from another person toileting, which includes using toliet, bedpan, or urinal?: Total ?Help from another person bathing (including washing, rinsing, drying)?: Total ?Help from another person to put on and taking off regular upper body clothing?: Total ?Help from another person to put on and taking off regular lower body clothing?: Total ?6 Click Score: 6 ? ?  ?End of Session   ? ?OT Visit Diagnosis: Unsteadiness on feet (R26.81);Other abnormalities of gait and mobility (R26.89);Muscle weakness (generalized) (M62.81);Other symptoms and signs involving the nervous system (R29.898);Other symptoms and signs involving cognitive function;Pain ?Pain - part of body:  (generalized) ?  ?Activity Tolerance Patient limited by lethargy;Patient limited by fatigue;Other (comment) (decreased ability to follow commands) ?  ?Patient Left in bed;with call bell/phone within reach;with bed alarm set ?  ?Nurse Communication Mobility status;Need for lift equipment;Other (comment) (NT communication) ?  ? ?   ? ?Time: 4268-3419 ?OT Time Calculation (min): 14 min ? ?Charges: OT General Charges ?$OT  Visit: 1 Visit ?OT Treatments ?$Self Care/Home Management : 8-22 mins ? ?Pollyann GlenMary Kate K., COTA/L ?Acute Rehabilitation Services ?[phone] ? ? ?Barron SchmidMary Kate Isa Hitz ?04/05/2022, 3:31 PM ?

## 2022-04-05 NOTE — Care Management Important Message (Signed)
Important Message ? ?Patient Details  ?Name: Frederick [name] ?MRN: [id number] ?Date of Birth: Dec 10, 1960 ? ? ?Medicare Important Message Given:  Yes ? ? ? ? ?Jil Penland ?04/05/2022, 4:12 PM ?

## 2022-04-05 NOTE — Progress Notes (Signed)
? ?[facility] ?PROGRESS NOTE ? ? ?Frederick [name] NOB:[id number] DOB: 09-14-1961 DOA: 03/19/2022  16 ?DOS: the patient was seen and examined on 04/05/2022 ? ?PCP: [facility] ? ?Brief History and Hospital Course: 61 y.o. male with history of seizure, alcohol abuse, pulmonary diverticulosis, hypertension who was admitted in February for status epilepticus in the setting of patient being noncompliant with his medication had to be intubated at the time and also had a stroke. He was found to have generalized tonic-clonic activity by patient's family and was brought to the ER.  Initially admitted to the floor.  However patient decompensated and had to be transferred to the ICU.  Had to be intubated again.  Neurology was consulted.  Patient was stabilized.  Subsequently extubated. ? ?Consultants: Neurology.  Critical care medicine. ? ?Procedures: Intubation followed by extubation ? ? ? ?Subjective: ?Patient remains awake alert but confused.  Follows commands but does not answer questions appropriately at all times.   ? ? ?Assessment/Plan: ? ?History of seizures/status epilepticus ?History of EtOH abuse and medical noncompliance ?Initially in status epilepticus.  Had to be intubated and subsequently extubated. ?Neurology recommends continuing Vimpat, Keppra and perampanel. ?Phenobarbital being weaned.  Neurology plans to discontinue perampanel soon if he remains seizure-free. ?Stable from a seizure standpoint. ? ?Acute metabolic encephalopathy ?Altered mental status thought to be secondary to seizure as well as medications.  ?Mentation has been very slow to improve.  Still very confused at times.  Continue current medications including Seroquel. ? ?Acute respiratory failure related to status epilepticus ?Presumed aspiration pneumonia ?Urinary tract infection ?Seems to be stable from a respiratory standpoint.  Patient was extubated. ?Completed course of antibiotics.   ?WBC was noted to be slightly higher yesterday.   Low-grade fever noted overnight.  He is noted to have a cough this morning.  Will check chest x-ray and UA.  Abdomen remains benign.  Recheck labs tomorrow.   ?Chest x-ray was done this morning and shows similar findings compared to the film from April 19.  Patient is noted to be rotated on this film.  Continue to monitor clinically for now.  If he starts spiking fevers or if his WBC starts to climb again then may have a low threshold to reinitiate antibiotics. ?  ?History of EtOH abuse/tobacco abuse ?Weaned off of phenobarbital.  Continue with thiamine folate multivitamins.  Apparently he was craving cigarettes.  Nicotine patch was ordered. ? ?Nutrition/oropharyngeal dysphagia ?Due to encephalopathy patient is unable to safely take oral diet.  He has a nasogastric feeding tube.  This is to be continued for now. ?Speech therapy continues to follow. ? ?Hypertension ?Chronic. Became hypotensive with precedex but that resolved.  ?Currently noted to be on carvedilol.  Blood pressure is reasonably well controlled. ? 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  Being continued on aspirin.  Noted to be on cilostazol. ? ?Electrolyte derangement ?Mild hyponatremia/hypomagnesemia ?Monitor electrolytes periodically.  Magnesium 1.7 yesterday and was repleted. ? ?Normocytic anemia ?Stable.  No evidence of overt bleeding.   ? ?Diarrhea ?Rectal tube noted.  Diarrhea could be due to tube feedings.  Imodium.  Abdomen remains benign. ? ?Previous history of DVT/PE ?Has completed course of anticoagulation. ?  ?Moderate protein calorie malnutrition ?Nutrition Problem: Moderate Malnutrition ?Etiology: social / environmental circumstances (alcohol abuse) ?Signs/Symptoms: mild fat depletion, mild muscle depletion, moderate muscle depletion ?Interventions: Tube feeding ? ? ?DVT Prophylaxis: On Lovenox ?Code Status: Full code ?Family Communication: No family at bedside ?Disposition Plan:  Remains  encephalopathic.  Not ready for discharge.  PT and OT evaluation.  Speech therapy evaluation to continue.  Eventually plan is to go to skilled nursing facility when medically improved. ? ?Status is: Inpatient ?Remains inpatient appropriate because: Acute encephalopathy, seizures ? ? ? ? ?Medications: Scheduled: ? aspirin  300 mg Rectal Daily  ? Or  ? aspirin  325 mg Per Tube Daily  ? carvedilol  12.5 mg Per Tube BID WC  ? chlorhexidine gluconate (MEDLINE KIT)  15 mL Mouth Rinse BID  ? Chlorhexidine Gluconate Cloth  6 each Topical Daily  ? cilostazol  100 mg Per Tube BID  ? enoxaparin (LOVENOX) injection  40 mg Subcutaneous Q24H  ? feeding supplement (PROSource TF)  45 mL Per Tube BID  ? folic acid  1 mg Per Tube Daily  ? lacosamide  150 mg Per Tube BID  ? levETIRAcetam  1,500 mg Per Tube BID  ? loperamide HCl  4 mg Per Tube TID  ? multivitamin with minerals  1 tablet Per Tube Daily  ? nicotine  14 mg Transdermal Daily  ? perampanel  2 mg Per Tube QHS  ? QUEtiapine  25 mg Per Tube QHS  ? thiamine  100 mg Per Tube Daily  ? ?Continuous: ? sodium chloride Stopped (04/03/22 1035)  ? feeding supplement (JEVITY 1.5 CAL/FIBER) 1,000 mL (04/03/22 2218)  ? ?MCN:[id number] **OR** acetaminophen (TYLENOL) oral liquid 160 mg/5 mL **OR** acetaminophen, docusate, hydrALAZINE, loperamide HCl, polyethylene glycol ? ?Antibiotics: ?Anti-infectives (From admission, onward)  ? ? Start     Dose/Rate Route Frequency Ordered Stop  ? 03/24/22 1000  amoxicillin (AMOXIL) capsule 500 mg       ? 500 mg Per Tube Every 8 hours 03/24/22 0851 03/27/22 2121  ? 03/22/22 1000  Ampicillin-Sulbactam (UNASYN) 3 g in sodium chloride 0.9 % 100 mL IVPB  Status:  Discontinued       ? 3 g ?200 mL/hr over 30 Minutes Intravenous Every 6 hours 03/22/22 0842 03/24/22 0851  ? 03/20/22 1700  cefTRIAXone (ROCEPHIN) 2 g in sodium chloride 0.9 % 100 mL IVPB  Status:  Discontinued       ? 2 g ?200 mL/hr over 30 Minutes Intravenous Every 24 hours 03/20/22 1611  03/22/22 0842  ? ?  ? ? ?Objective: ? ?Vital Signs ? ?Vitals:  ? 04/04/22 2328 04/05/22 0309 04/05/22 0500 04/05/22 0752  ?BP:  115/70    ?Pulse: (!) 101 92    ?Resp: 19 19    ?Temp: 99.6 ?F (37.6 ?C) 100.2 ?F (37.9 ?C)  98 ?F (36.7 ?C)  ?TempSrc: Axillary Axillary  Oral  ?SpO2:  93%    ?Weight:   78.3 kg   ?Height:      ? ? ?Intake/Output Summary (Last 24 hours) at 04/05/2022 0933 ?Last data filed at 04/05/2022 0551 ?Gross per 24 hour  ?Intake --  ?Output 1250 ml  ?Net -1250 ml  ? ? ?Filed Weights  ? 04/02/22 0500 04/04/22 0412 04/05/22 0500  ?Weight: 77.9 kg 75.7 kg 78.3 kg  ? ? ?General appearance: Awake alert.  In no distress.  Distracted ?Resp: Coarse breath sound bilaterally with few crackles at the bases.  No wheezing or rhonchi. ?Cardio: S1-S2 is normal regular.  No S3-S4.  No rubs murmurs or bruit ?GI: Abdomen is soft.  Nontender nondistended.  Bowel sounds are present normal.  No masses organomegaly ?Extremities: No edema.   ?Neurologic:   No focal neurological deficits.  ? ?Lab Results: ? ?Data  Reviewed: I have personally reviewed labs and imaging study reports ? ?CBC: ?Recent Labs  ?Lab 03/29/22 ?2048 03/30/22 ?1898 03/31/22 ?0143 04/02/22 ?4210 04/04/22 ?0145  ?WBC  --  7.7 8.7 9.5 11.2*  ?NEUTROABS  --   --  4.7  --   --   ?HGB 10.9* 11.6* 11.6* 11.6* 11.5*  ?HCT 32.0* 35.5* 35.3* 35.3* 36.3*  ?MCV  --  82.6 81.7 81.7 82.3  ?PLT  --  294 340 378 427*  ? ? ? ?Basic Metabolic Panel: ?Recent Labs  ?Lab 03/29/22 ?2048 03/30/22 ?3128 03/31/22 ?0143 04/02/22 ?1188 04/04/22 ?0145  ?NA 132* 133* 134* 136 134*  ?K 4.1 4.0 4.2 4.0 4.4  ?CL  --  102 101 101 100  ?CO2  --  '26 26 28 27  ' ?GLUCOSE  --  108* 135* 125* 122*  ?BUN  --  20 20 24* 30*  ?CREATININE  --  0.77 0.73 0.79 0.86  ?CALCIUM  --  9.5 9.4 9.6 9.5  ?MG  --  1.8 1.7 1.5* 1.7  ? ? ? ?GFR: ?Estimated Creatinine Clearance: 99.9 mL/min (by C-G formula based on SCr of 0.86 mg/dL). ? ?CBG: ?Recent Labs  ?Lab 04/04/22 ?1544 04/04/22 ?1944 04/04/22 ?2314  04/05/22 ?6773 04/05/22 ?0751  ?GLUCAP 135* 132* 145* 118* 162*  ? ? ? ? ?Radiology Studies: ?DG CHEST PORT 1 VIEW ? ?Result Date: 04/05/2022 ?CLINICAL DATA:  Cough. EXAM: PORTABLE CHEST 1 VIEW COMPARISON:  03/24/2022 and CT

## 2022-04-06 ENCOUNTER — Inpatient Hospital Stay (HOSPITAL_COMMUNITY): Payer: Medicare Other

## 2022-04-06 DIAGNOSIS — I10 Essential (primary) hypertension: Secondary | ICD-10-CM | POA: Diagnosis not present

## 2022-04-06 DIAGNOSIS — G9341 Metabolic encephalopathy: Secondary | ICD-10-CM | POA: Diagnosis not present

## 2022-04-06 DIAGNOSIS — R569 Unspecified convulsions: Secondary | ICD-10-CM | POA: Diagnosis not present

## 2022-04-06 DIAGNOSIS — F1029 Alcohol dependence with unspecified alcohol-induced disorder: Secondary | ICD-10-CM | POA: Diagnosis not present

## 2022-04-06 LAB — CBC
HCT: 37 % — ABNORMAL LOW (ref 39.0–52.0)
Hemoglobin: 11.9 g/dL — ABNORMAL LOW (ref 13.0–17.0)
MCH: 26.3 pg (ref 26.0–34.0)
MCHC: 32.2 g/dL (ref 30.0–36.0)
MCV: 81.7 fL (ref 80.0–100.0)
Platelets: 392 10*3/uL (ref 150–400)
RBC: 4.53 MIL/uL (ref 4.22–5.81)
RDW: 17.4 % — ABNORMAL HIGH (ref 11.5–15.5)
WBC: 11.3 10*3/uL — ABNORMAL HIGH (ref 4.0–10.5)
nRBC: 0 % (ref 0.0–0.2)

## 2022-04-06 LAB — MAGNESIUM: Magnesium: 2 mg/dL (ref 1.7–2.4)

## 2022-04-06 LAB — BASIC METABOLIC PANEL
Anion gap: 9 (ref 5–15)
BUN: 39 mg/dL — ABNORMAL HIGH (ref 8–23)
CO2: 27 mmol/L (ref 22–32)
Calcium: 9.3 mg/dL (ref 8.9–10.3)
Chloride: 98 mmol/L (ref 98–111)
Creatinine, Ser: 0.99 mg/dL (ref 0.61–1.24)
GFR, Estimated: >60 mL/min (ref >60)
Glucose, Bld: 131 mg/dL — ABNORMAL HIGH (ref 70–99)
Potassium: 4.3 mmol/L (ref 3.5–5.1)
Sodium: 134 mmol/L — ABNORMAL LOW (ref 135–145)

## 2022-04-06 LAB — GLUCOSE, CAPILLARY
Glucose-Capillary: 138 mg/dL — ABNORMAL HIGH (ref 70–99)
Glucose-Capillary: 130 mg/dL — ABNORMAL HIGH (ref 70–99)
Glucose-Capillary: 131 mg/dL — ABNORMAL HIGH (ref 70–99)
Glucose-Capillary: 120 mg/dL — ABNORMAL HIGH (ref 70–99)
Glucose-Capillary: 106 mg/dL — ABNORMAL HIGH (ref 70–99)

## 2022-04-06 MED ORDER — LEVETIRACETAM 100 MG/ML PO SOLN
1000.0000 mg | Freq: Two times a day (BID) | ORAL | Status: DC
  Administered 2022-04-06 – 2022-04-10 (×9): 1000 mg
  Filled 2022-04-06 (×9): qty 10

## 2022-04-06 MED ORDER — HALOPERIDOL LACTATE 5 MG/ML IJ SOLN
1.0000 mg | Freq: Four times a day (QID) | INTRAMUSCULAR | Status: DC | PRN
  Administered 2022-04-07 – 2022-04-10 (×3): 1 mg via INTRAVENOUS
  Filled 2022-04-06 (×3): qty 1

## 2022-04-06 MED ORDER — QUETIAPINE 12.5 MG HALF TABLET
12.5000 mg | ORAL_TABLET | Freq: Every day | ORAL | Status: DC
Start: 2022-04-06 — End: 2022-04-10
  Administered 2022-04-06 – 2022-04-09 (×4): 12.5 mg
  Filled 2022-04-06 (×6): qty 1

## 2022-04-06 NOTE — Progress Notes (Signed)
Physical Therapy Treatment ?Patient Details ?Name: Frederick [name] ?MRN: [id number] ?DOB: [birth year] ?Today's Date: 04/06/2022 ? ? ?History of Present Illness 61 y.o. male brought in 03/19/22 due to seizure activity and possible increased weakness on left with incr slurred speech. CT head no acute findings. Intubated 4/15, extubated 4/24. PMH seizure, alcohol abuse, pulmonary diverticulosis, hypertension; admitted in February for seizures due to medication noncompliance and hypoglycemia ? ?  ?PT Comments  ? ? Pt lethargic on eval, not attempting to verbalize unless negatively stimulated with cold cloth or sitting up EOB, then able to express displeasure and desire to be lift alone and lie back down. Pt tot A for all mobility. Very stiff with passive initiation of mvmt but relaxes with prolonged low load. Continue to recommend SNF and PT will continue to follow.  ?  ?Recommendations for follow up therapy are one component of a multi-disciplinary discharge planning process, led by the attending physician.  Recommendations may be updated based on patient status, additional functional criteria and insurance authorization. ? ?Follow Up Recommendations ? Skilled nursing-short term rehab (<3 hours/day) ?  ?  ?Assistance Recommended at Discharge Frequent or constant Supervision/Assistance  ?Patient can return home with the following Two people to help with walking and/or transfers;Two people to help with bathing/dressing/bathroom;Assist for transportation;Direct supervision/assist for financial management;Direct supervision/assist for medications management;Assistance with cooking/housework;Help with stairs or ramp for entrance ?  ?Equipment Recommendations ? Other (comment);Hospital bed (hoyer lift)  ?  ?Recommendations for Other Services   ? ? ?  ?Precautions / Restrictions Precautions ?Precautions: Fall ?Precaution Comments: flexiseal; cortrack, hand mits ?Restrictions ?Weight Bearing Restrictions: No  ?  ? ?Mobility ? Bed  Mobility ?Overal bed mobility: Needs Assistance ?Bed Mobility: Rolling ?Rolling: Total assist ?  ?  ?  ?  ?General bed mobility comments: total A to roll R<>L, Pt not grasping rail when facilitated, avoids truncal rotation and segmental disassociation ?  ? ?Transfers ?Overall transfer level:  (will need sky lift/maximove) ?  ?  ?  ?  ?  ?  ?  ?  ?General transfer comment: placed bed in chair position to ease pt into hip flexion. Pt tolerated well. Then pivoted LE's off bed to L to dangle EOB. Pt maintained L lean against HOB, hip extension noted, and pt did verbalize that he didn't like [place] moving him "all around" ?  ? ?Ambulation/Gait ?  ?  ?  ?  ?  ?  ?  ?  ? ? ?Stairs ?  ?  ?  ?  ?  ? ? ?Wheelchair Mobility ?  ? ?Modified Rankin (Stroke Patients Only) ?  ? ? ?  ?Balance Overall balance assessment: Needs assistance ?Sitting-balance support:  (trunk supported) ?Sitting balance-Leahy Scale: Zero ?  ?  ?  ?  ?  ?  ?  ?  ?  ?  ?  ?  ?  ?  ?  ?  ?  ? ?  ?Cognition Arousal/Alertness: Lethargic ?Behavior During Therapy: Flat affect ?Overall Cognitive Status: No family/caregiver present to determine baseline cognitive functioning ?Area of Impairment: Orientation, Attention, Memory, Following commands, Safety/judgement, Awareness, Problem solving ?  ?  ?  ?  ?  ?  ?  ?  ?Orientation Level: Disoriented to, Place, Time, Situation ?Current Attention Level: Focused ?Memory: Decreased short-term memory, Decreased recall of precautions ?Following Commands: Follows one step commands inconsistently ?Safety/Judgement: Decreased awareness of safety, Decreased awareness of deficits ?Awareness: Intellectual ?Problem Solving: Slow processing, Decreased initiation, Difficulty sequencing, Requires  verbal cues, Requires tactile cues ?General Comments: pt with minimal verbalization, not following commands, keeps eyes closed except when cold washcloth put on face and when uncomfortable ?  ?  ? ?  ?Exercises   ? ?  ?General Comments General  comments (skin integrity, edema, etc.): passive hip and knee flexion and extension performed in supine as well as truncal rotation. Pt resists initial mvmt and then relaxes after ~15 secs of low load ?  ?  ? ?Pertinent Vitals/Pain Pain Assessment ?Pain Assessment: Faces ?Faces Pain Scale: Hurts little more ?Pain Location: generalized ?Pain Descriptors / Indicators: Discomfort, Grimacing ?Pain Intervention(s): Monitored during session  ? ? ?Home Living   ?  ?  ?  ?  ?  ?  ?  ?  ?  ?   ?  ?Prior Function    ?  ?  ?   ? ?PT Goals (current goals can now be found in the care plan section) Acute Rehab PT Goals ?Patient Stated Goal: Pt unable to state ?PT Goal Formulation: Patient unable to participate in goal setting ?Time For Goal Achievement: 04/15/22 ?Potential to Achieve Goals: Fair ?Progress towards PT goals: Not progressing toward goals - comment (lethargy) ? ?  ?Frequency ? ? ? Min 2X/week ? ? ? ?  ?PT Plan Current plan remains appropriate  ? ? ?Co-evaluation PT/OT/SLP Co-Evaluation/Treatment: Yes ?Reason for Co-Treatment: Complexity of the patient's impairments (multi-system involvement);Necessary to address cognition/behavior during functional activity;For patient/therapist safety ?PT goals addressed during session: Mobility/safety with mobility;Balance ?  ?  ? ?  ?AM-PAC PT "6 Clicks" Mobility   ?Outcome Measure ? Help needed turning from your back to your side while in a flat bed without using bedrails?: Total ?Help needed moving from lying on your back to sitting on the side of a flat bed without using bedrails?: Total ?Help needed moving to and from a bed to a chair (including a wheelchair)?: Total ?Help needed standing up from a chair using your arms (e.g., wheelchair or bedside chair)?: Total ?Help needed to walk in hospital room?: Total ?Help needed climbing 3-5 steps with a railing? : Total ?6 Click Score: 6 ? ?  ?End of Session Equipment Utilized During Treatment: Oxygen ?Activity Tolerance: Patient  limited by lethargy ?Patient left: in bed;with call bell/phone within reach;with bed alarm set ?Nurse Communication: Mobility status ?PT Visit Diagnosis: Other abnormalities of gait and mobility (R26.89);Difficulty in walking, not elsewhere classified (R26.2);Muscle weakness (generalized) (M62.81) ?  ? ? ?Time: 5670-1410 ?PT Time Calculation (min) (ACUTE ONLY): 26 min ? ?Charges:  $Therapeutic Activity: 8-22 mins          ?          ? ?Lyanne Co, PT  ?Acute Rehab Services ? Pager [phone] ?Office [phone] ? ? ? ?Gerline Ratto L Telena Peyser ?04/06/2022, 10:18 AM ? ?

## 2022-04-06 NOTE — Progress Notes (Signed)
? ?[facility] ?PROGRESS NOTE ? ? ?Frederick [name] IRJ:[id number] DOB: [birth year] DOA: 03/19/2022  17 ?DOS: the patient was seen and examined on 04/06/2022 ? ?PCP: [facility] ? ?Brief History and Hospital Course: 61 y.o. male with history of seizure, alcohol abuse, pulmonary diverticulosis, hypertension who was admitted in February for status epilepticus in the setting of patient being noncompliant with his medication had to be intubated at the time and also had a stroke. He was found to have generalized tonic-clonic activity by patient's family and was brought to the ER.  Initially admitted to the floor.  However patient decompensated and had to be transferred to the ICU.  Had to be intubated again.  Neurology was consulted.  Patient was stabilized.  Subsequently extubated. ? ?Consultants: Neurology.  Critical care medicine. ? ?Procedures: Intubation followed by extubation ? ? ? ?Subjective: ?Patient noted to be sleepy this morning.  Easily arousable.  Remains confused.    ? ? ?Assessment/Plan: ? ?History of seizures/status epilepticus ?History of EtOH abuse and medical noncompliance ?Initially in status epilepticus.  Had to be intubated and subsequently extubated. ?Neurology recommends continuing Vimpat, Keppra and perampanel. ?Phenobarbital being weaned.  Neurology plans to discontinue perampanel soon if he remains seizure-free.  We will reach out to them today. ?Stable from a seizure standpoint. ? ?Acute metabolic encephalopathy ?Altered mental status thought to be secondary to seizure as well as medications.  ?Mentation has been very slow to improve.  Still very confused at times.  Continue current medications including Seroquel. ? ?Acute respiratory failure related to status epilepticus ?Presumed aspiration pneumonia ?Urinary tract infection ?Seems to be stable from a respiratory standpoint.  Patient was extubated. ?Completed course of antibiotics.  ?WBC slightly high but stable.  Low-grade fevers  noted at times. UA was unremarkable.  Chest x-ray was a rotated film but similar to film from April 19.  Due to the rotation it does appear that he is got worsening infiltrate on the right.  Respiratory status noted to be stable for the most part. Continue to monitor clinically for now.  If he starts spiking fevers or if his WBC starts to climb again then may have a low threshold to reinitiate antibiotics. ?Does not appear to be overtly volume overloaded. ?  ?History of EtOH abuse/tobacco abuse ?Weaned off of phenobarbital.  Continue with thiamine folate multivitamins.  Apparently he was craving cigarettes.  Nicotine patch was ordered. ? ?Oropharyngeal dysphagia ?Due to encephalopathy patient is unable to safely take oral diet.  He has a nasogastric feeding tube.  This is to be continued for now. ?Speech therapy continues to follow.  Remains unsafe for oral intake. ? ?Hypertension ?Chronic. Became hypotensive with precedex but that resolved.  ?Currently noted to be on carvedilol.  Blood pressure is reasonably well controlled. ? 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  Being continued on aspirin.  Noted to be on cilostazol. ? ?Electrolyte derangement ?Mild hyponatremia/hypomagnesemia ?Potassium and magnesium normal today. ? ?Normocytic anemia ?Stable.  No evidence of overt bleeding.   ? ?Diarrhea ?Rectal tube noted.  Diarrhea could be due to tube feedings.  Imodium.  Abdomen remains benign. ? ?Previous history of DVT/PE ?Has completed course of anticoagulation. ?  ?Moderate protein calorie malnutrition ?Nutrition Problem: Moderate Malnutrition ?Etiology: social / environmental circumstances (alcohol abuse) ?Signs/Symptoms: mild fat depletion, mild muscle depletion, moderate muscle depletion ?Interventions: Tube feeding ? ? ?DVT Prophylaxis: On Lovenox ?Code Status: Full code ?Family Communication: No family at bedside ?Disposition Plan:  Remains encephalopathic.  Not ready for  discharge.  PT and OT evaluation.  Speech therapy evaluation to continue.  Eventually plan is to go to skilled nursing facility when medically improved. ? ?Status is: Inpatient ?Remains inpatient appropriate because: Acute encephalopathy, seizures ? ? ? ? ?Medications: Scheduled: ? aspirin  300 mg Rectal Daily  ? Or  ? aspirin  325 mg Per Tube Daily  ? carvedilol  12.5 mg Per Tube BID WC  ? chlorhexidine gluconate (MEDLINE KIT)  15 mL Mouth Rinse BID  ? Chlorhexidine Gluconate Cloth  6 each Topical Daily  ? cilostazol  100 mg Per Tube BID  ? enoxaparin (LOVENOX) injection  40 mg Subcutaneous Q24H  ? feeding supplement (PROSource TF)  45 mL Per Tube BID  ? folic acid  1 mg Per Tube Daily  ? lacosamide  150 mg Per Tube BID  ? levETIRAcetam  1,500 mg Per Tube BID  ? multivitamin with minerals  1 tablet Per Tube Daily  ? nicotine  14 mg Transdermal Daily  ? perampanel  2 mg Per Tube QHS  ? QUEtiapine  25 mg Per Tube QHS  ? thiamine  100 mg Per Tube Daily  ? ?Continuous: ? sodium chloride Stopped (04/03/22 1035)  ? feeding supplement (JEVITY 1.5 CAL/FIBER) 1,000 mL (04/06/22 0437)  ? ?QQV:ZDGLOVFIEPPIR **OR** acetaminophen (TYLENOL) oral liquid 160 mg/5 mL **OR** acetaminophen, docusate, hydrALAZINE, loperamide HCl, polyethylene glycol ? ?Antibiotics: ?Anti-infectives (From admission, onward)  ? ? Start     Dose/Rate Route Frequency Ordered Stop  ? 03/24/22 1000  amoxicillin (AMOXIL) capsule 500 mg       ? 500 mg Per Tube Every 8 hours 03/24/22 0851 03/27/22 2121  ? 03/22/22 1000  Ampicillin-Sulbactam (UNASYN) 3 g in sodium chloride 0.9 % 100 mL IVPB  Status:  Discontinued       ? 3 g ?200 mL/hr over 30 Minutes Intravenous Every 6 hours 03/22/22 0842 03/24/22 0851  ? 03/20/22 1700  cefTRIAXone (ROCEPHIN) 2 g in sodium chloride 0.9 % 100 mL IVPB  Status:  Discontinued       ? 2 g ?200 mL/hr over 30 Minutes Intravenous Every 24 hours 03/20/22 1611 03/22/22 0842  ? ?  ? ? ?Objective: ? ?Vital Signs ? ?Vitals:  ? 04/05/22  2324 04/06/22 0330 04/06/22 0500 04/06/22 0743  ?BP: 113/60 127/67  112/71  ?Pulse: 100 (!) 106  92  ?Resp: '20 20  20  ' ?Temp: 98.1 ?F (36.7 ?C) 98.5 ?F (36.9 ?C)  99 ?F (37.2 ?C)  ?TempSrc: Axillary Axillary    ?SpO2: 93% 91%  98%  ?Weight:   78.1 kg   ?Height:      ? ? ?Intake/Output Summary (Last 24 hours) at 04/06/2022 0902 ?Last data filed at 04/06/2022 5188 ?Gross per 24 hour  ?Intake --  ?Output 900 ml  ?Net -900 ml  ? ? ?Filed Weights  ? 04/04/22 0412 04/05/22 0500 04/06/22 0500  ?Weight: 75.7 kg 78.3 kg 78.1 kg  ? ? ?General appearance: Somnolent but easily arousable.  Distracted ?Resp: Coarse breath sounds.  Diminished air entry at the bases.  No wheezing or rhonchi.  Crackles at the bases. ?Cardio: S1-S2 is normal regular.  No S3-S4.  No rubs murmurs or bruit ?GI: Abdomen is soft.  Nontender nondistended.  Bowel sounds are present normal.  No masses organomegaly ?Extremities: No edema.  Has been moving his extremities ?Neurologic: No focal neurological deficits.  ? ? ?Lab Results: ? ?Data Reviewed: I have personally  reviewed labs and imaging study reports ? ?CBC: ?Recent Labs  ?Lab 03/31/22 ?0143 04/02/22 ?0415 04/04/22 ?0145 04/06/22 ?0300  ?WBC 8.7 9.5 11.2* 11.3*  ?NEUTROABS 4.7  --   --   --   ?HGB 11.6* 11.6* 11.5* 11.9*  ?HCT 35.3* 35.3* 36.3* 37.0*  ?MCV 81.7 81.7 82.3 81.7  ?PLT 340 378 427* 392  ? ? ? ?Basic Metabolic Panel: ?Recent Labs  ?Lab 03/31/22 ?0143 04/02/22 ?0415 04/04/22 ?0145 04/06/22 ?0300  ?NA 134* 136 134* 134*  ?K 4.2 4.0 4.4 4.3  ?CL 101 101 100 98  ?CO2 '26 28 27 27  ' ?GLUCOSE 135* 125* 122* 131*  ?BUN 20 24* 30* 39*  ?CREATININE 0.73 0.79 0.86 0.99  ?CALCIUM 9.4 9.6 9.5 9.3  ?MG 1.7 1.5* 1.7 2.0  ? ? ? ?GFR: ?Estimated Creatinine Clearance: 86.6 mL/min (by C-G formula based on SCr of 0.99 mg/dL). ? ?CBG: ?Recent Labs  ?Lab 04/05/22 ?1113 04/05/22 ?1522 04/05/22 ?2017 04/05/22 ?2337 04/06/22 ?9826  ?GLUCAP 136* 148* 134* 142* 138*  ? ? ? ? ?Radiology Studies: ?DG CHEST PORT 1  VIEW ? ?Result Date: 04/05/2022 ?CLINICAL DATA:  Cough. EXAM: PORTABLE CHEST 1 VIEW COMPARISON:  03/24/2022 and CT chest 08/17/2021. FINDINGS: Patient is rotated. Feeding tube is followed into the stomach with the ti

## 2022-04-06 NOTE — Progress Notes (Signed)
LTM EEG hooked up and running - no initial skin breakdown - push button tested - neuro notified. [facility] monitoring.  

## 2022-04-06 NOTE — Progress Notes (Signed)
Occupational Therapy Treatment ?Patient Details ?Name: Frederick [name] ?MRN: [id number] ?DOB: 1961/06/09 ?Today's Date: 04/06/2022 ? ? ?History of present illness 61 y.o. male brought in 03/19/22 due to seizure activity and possible increased weakness on left with incr slurred speech. CT head no acute findings. Intubated 4/15, extubated 4/24. PMH seizure, alcohol abuse, pulmonary diverticulosis, hypertension; admitted in February for seizures due to medication noncompliance and hypoglycemia ?  ?OT comments ? Pt rolled at the start of session due to the odor and suspected loose stool. Pt found to only have a smear present but odor of stool is very strong. Pt verbalized dislike for static sitting EOB to therapist and does not sustain. Pt resistive to all movement. Pt is high risk for skin break down due to lack of mobility. Recommendation for SNF with total care.   ? ?Recommendations for follow up therapy are one component of a multi-disciplinary discharge planning process, led by the attending physician.  Recommendations may be updated based on patient status, additional functional criteria and insurance authorization. ?   ?Follow Up Recommendations ? Skilled nursing-short term rehab (<3 hours/day)  ?  ?Assistance Recommended at Discharge Frequent or constant Supervision/Assistance  ?Patient can return home with the following ? Two people to help with walking and/or transfers;Two people to help with bathing/dressing/bathroom;Assistance with cooking/housework;Assistance with feeding;Direct supervision/assist for medications management;Direct supervision/assist for financial management;Assist for transportation;Help with stairs or ramp for entrance ?  ?Equipment Recommendations ? None recommended by OT  ?  ?Recommendations for Other Services Rehab consult ? ?  ?Precautions / Restrictions Precautions ?Precautions: Fall ?Precaution Comments: flexiseal; cortrack, hand mittens bil, 3L [place] ?Restrictions ?Weight Bearing  Restrictions: No  ? ? ?  ? ?Mobility Bed Mobility ?Overal bed mobility: Needs Assistance ?Bed Mobility: Rolling ?Rolling: Total assist ?  ?  ?  ?  ?General bed mobility comments: total A to roll R<>L, Pt not grasping rail when facilitated, avoids truncal rotation and segmental disassociation ?  ? ?Transfers ?Overall transfer level:  (will need sky lift/maximove) ?  ?  ?  ?  ?  ?  ?  ?  ?General transfer comment: placed bed in chair position to ease pt into hip flexion. Pt tolerated well. Then pivoted LE's off bed to L to dangle EOB. Pt maintained L lean against HOB, hip extension noted, and pt did verbalize that he didn't like [place] moving him "all around" pt with knee extension back toward bed resisting EOB positioning. ?  ?  ?Balance Overall balance assessment: Needs assistance ?Sitting-balance support:  (trunk supported) ?Sitting balance-Leahy Scale: Zero ?  ?  ?  ?  ?  ?  ?  ?  ?  ?  ?  ?  ?  ?  ?  ?  ?   ? ?ADL either performed or assessed with clinical judgement  ? ?ADL Overall ADL's : Needs assistance/impaired ?Eating/Feeding: Total assistance ?  ?Grooming: Total assistance ?  ?Upper Body Bathing: Total assistance ?  ?Lower Body Bathing: Total assistance ?  ?Upper Body Dressing : Total assistance ?  ?Lower Body Dressing: Total assistance ?  ?  ?  ?  ?  ?  ?  ?  ?General ADL Comments: total (A) for all adls, pt with flexiseal due to diarrhea present with strong odor. ?  ? ?Extremity/Trunk Assessment Upper Extremity Assessment ?Upper Extremity Assessment: Generalized weakness ?  ?Lower Extremity Assessment ?Lower Extremity Assessment: Generalized weakness ?  ?  ?  ? ?Vision   ?Vision Assessment?:  (  unable to assess as pt will not open eyes and when OT attempts to open lids closes tight. pt opening eyes once for less than 15 seconds.) ?  ?Perception   ?  ?Praxis   ?  ? ?Cognition Arousal/Alertness: Lethargic ?Behavior During Therapy: Flat affect ?Overall Cognitive Status: No family/caregiver present to determine  baseline cognitive functioning ?Area of Impairment: Orientation, Attention, Memory, Following commands, Safety/judgement, Awareness, Problem solving ?  ?  ?  ?  ?  ?  ?  ?  ?Orientation Level: Disoriented to, Place, Time, Situation ?Current Attention Level: Focused ?Memory: Decreased short-term memory, Decreased recall of precautions ?Following Commands: Follows one step commands inconsistently ?Safety/Judgement: Decreased awareness of safety, Decreased awareness of deficits ?Awareness: Intellectual ?Problem Solving: Slow processing, Decreased initiation, Difficulty sequencing, Requires verbal cues, Requires tactile cues ?General Comments: pt with minimal verbalization, not following commands, keeps eyes closed except when cold washcloth put on face and when uncomfortable. Pt cussed once in sitting states "yall are damn killing me" "lay back down" ?  ?  ?   ?Exercises   ? ?  ?Shoulder Instructions   ? ? ?  ?General Comments passive hip and knee flexion and extension performed in supine as well as truncal rotation. Pt resists initial mvmt and then relaxes after ~15 secs of low load. Pt resistant to BIL UE movement  ? ? ?Pertinent Vitals/ Pain       Pain Assessment ?Pain Assessment: Faces ?Faces Pain Scale: Hurts little more ?Pain Location: generalized ?Pain Descriptors / Indicators: Discomfort, Grimacing ?Pain Intervention(s): Monitored during session, Repositioned ? ?Home Living   ?  ?  ?  ?  ?  ?  ?  ?  ?  ?  ?  ?  ?  ?  ?  ?  ?  ?  ? ?  ?Prior Functioning/Environment    ?  ?  ?  ?   ? ?Frequency ? Min 2X/week  ? ? ? ? ?  ?Progress Toward Goals ? ?OT Goals(current goals can now be found in the care plan section) ? Progress towards OT goals: Not progressing toward goals - comment ? ?Acute Rehab OT Goals ?Patient Stated Goal: to not move ?OT Goal Formulation: Patient unable to participate in goal setting ?Time For Goal Achievement: 04/15/22 ?Potential to Achieve Goals: Fair ?ADL Goals ?Pt Will Perform Grooming: with  set-up;sitting ?Pt Will Perform Upper Body Bathing: with min assist ?Pt Will Perform Lower Body Bathing: with mod assist ?Pt Will Transfer to Toilet: with min assist;bedside commode ?Pt Will Perform Toileting - Clothing Manipulation and hygiene: with min assist  ?Plan Discharge plan remains appropriate;Frequency remains appropriate   ? ?Co-evaluation ? ? ? PT/OT/SLP Co-Evaluation/Treatment: Yes ?Reason for Co-Treatment: Complexity of the patient's impairments (multi-system involvement);Necessary to address cognition/behavior during functional activity;For patient/therapist safety;To address functional/ADL transfers ?PT goals addressed during session: Mobility/safety with mobility;Balance ?OT goals addressed during session: ADL's and self-care;Proper use of Adaptive equipment and DME;Strengthening/ROM ?  ? ?  ?AM-PAC OT "6 Clicks" Daily Activity     ?Outcome Measure ? ? Help from another person eating meals?: Total ?Help from another person taking care of personal grooming?: Total ?Help from another person toileting, which includes using toliet, bedpan, or urinal?: Total ?Help from another person bathing (including washing, rinsing, drying)?: Total ?Help from another person to put on and taking off regular upper body clothing?: Total ?Help from another person to put on and taking off regular lower body clothing?: Total ?6 Click Score: 6 ? ?  ?  End of Session Equipment Utilized During Treatment: Oxygen ? ?OT Visit Diagnosis: Unsteadiness on feet (R26.81);Other abnormalities of gait and mobility (R26.89);Muscle weakness (generalized) (M62.81);Other symptoms and signs involving the nervous system (R29.898);Other symptoms and signs involving cognitive function;Pain ?Pain - part of body: Shoulder ?  ?Activity Tolerance Patient limited by lethargy;Patient limited by fatigue;Other (comment) ?  ?Patient Left in bed;with call bell/phone within reach;with bed alarm set ?  ?Nurse Communication Mobility status;Need for lift  equipment;Other (comment) ?  ? ?   ? ?Time: 3009-2330 ?OT Time Calculation (min): 26 min ? ?Charges: OT General Charges ?$OT Visit: 1 Visit ?OT Treatments ?$Self Care/Home Management : 8-22 mins ? ? ?Brynn, OTR/L  ?Acu

## 2022-04-06 NOTE — Progress Notes (Signed)
Subjective: Medicine team reconsulted because patient continues to be encephalopathic. ? ?ROS: Unable to obtain due to poor mental status ? ?Examination ? ?Vital signs in last 24 hours: ?Temp:  [98.1 ?F (36.7 ?C)-100 ?F (37.8 ?C)] 99 ?F (37.2 ?C) (05/02 2536) ?Pulse Rate:  [92-106] 92 (05/02 0743) ?Resp:  [20] 20 (05/02 0743) ?BP: (112-130)/(60-78) 112/71 (05/02 0743) ?SpO2:  [91 %-98 %] 98 % (05/02 0743) ?Weight:  [78.1 kg] 78.1 kg (05/02 0500) ? ?General: lying in bed, not in apparent distress ?Neuro: Winces to noxious stimuli but did not open his eyes, when I attempted to open patient's eyes he initially resisted opening, pupils did appear equal and reactive to light, did not see any forced gaze deviation, did withdraw to noxious stimuli in all 4 extremities ? ?Basic Metabolic Panel: ?Recent Labs  ?Lab 03/31/22 ?0143 04/02/22 ?0415 04/04/22 ?0145 04/06/22 ?0300  ?NA 134* 136 134* 134*  ?K 4.2 4.0 4.4 4.3  ?CL 101 101 100 98  ?CO2 26 28 27 27   ?GLUCOSE 135* 125* 122* 131*  ?BUN 20 24* 30* 39*  ?CREATININE 0.73 0.79 0.86 0.99  ?CALCIUM 9.4 9.6 9.5 9.3  ?MG 1.7 1.5* 1.7 2.0  ? ? ?CBC: ?Recent Labs  ?Lab 03/31/22 ?0143 04/02/22 ?0415 04/04/22 ?0145 04/06/22 ?0300  ?WBC 8.7 9.5 11.2* 11.3*  ?NEUTROABS 4.7  --   --   --   ?HGB 11.6* 11.6* 11.5* 11.9*  ?HCT 35.3* 35.3* 36.3* 37.0*  ?MCV 81.7 81.7 82.3 81.7  ?PLT 340 378 427* 392  ? ? ? ?Coagulation Studies: ?No results for input(s): LABPROT, INR in the last 72 hours. ? ?Imaging ?No new brain imaging overnight ? ?ASSESSMENT AND PLAN: 61 year old male who presented with focal convulsive status epilepticus (left upper extremity twitching) in the setting of hypoglycemia.  EEG showed right hemispheric status epilepticus which has gradually improved. ?   ?Focal convulsive status epilepticus, resolved ?Acute encephalopathy ?Chronic strokes ?Alcohol use disorder ?Hyponatremia ?Leukocytosis ?Microcytic anemia ?- No clinical seizures but encephalopathy could be due to medications  versus seizure recurrence versus delirium ? ?Recommendations ?-Discontinue perampanel to minimize sedation ?-We will obtain overnight EEG and if no evidence of seizures will also reduce Keppra to 1000 mg twice daily ?-Continue Vimpat 150 mg twice daily ?-We will discuss with RN and Dr. 77 if patient has had issues with agitation.  If not, we will reduce Seroquel to 12.5 mg nightly ?-Continue seizure precautions ?-Management of rest of comorbidities per primary team ?   ?I have spent a total of 35 minutes with the patient reviewing hospital notes,  test results, labs and examining the patient as well as establishing an assessment and plan.  > 50% of time was spent in direct patient care.  ? ? ?Rito Ehrlich ?Epilepsy ?[facility] ?For questions after 5pm please refer to AMION to reach the Neurologist on call ? ?

## 2022-04-07 ENCOUNTER — Inpatient Hospital Stay (HOSPITAL_COMMUNITY): Payer: Medicare Other

## 2022-04-07 DIAGNOSIS — R569 Unspecified convulsions: Secondary | ICD-10-CM | POA: Diagnosis not present

## 2022-04-07 DIAGNOSIS — F1029 Alcohol dependence with unspecified alcohol-induced disorder: Secondary | ICD-10-CM | POA: Diagnosis not present

## 2022-04-07 DIAGNOSIS — G9341 Metabolic encephalopathy: Secondary | ICD-10-CM | POA: Diagnosis not present

## 2022-04-07 LAB — CBC WITH DIFFERENTIAL/PLATELET
Abs Immature Granulocytes: 0.05 10*3/uL (ref 0.00–0.07)
Basophils Absolute: 0.1 10*3/uL (ref 0.0–0.1)
Basophils Relative: 0 %
Eosinophils Absolute: 0.6 10*3/uL — ABNORMAL HIGH (ref 0.0–0.5)
Eosinophils Relative: 5 %
HCT: 34.7 % — ABNORMAL LOW (ref 39.0–52.0)
Hemoglobin: 11.3 g/dL — ABNORMAL LOW (ref 13.0–17.0)
Immature Granulocytes: 0 %
Lymphocytes Relative: 15 %
Lymphs Abs: 1.9 10*3/uL (ref 0.7–4.0)
MCH: 26.9 pg (ref 26.0–34.0)
MCHC: 32.6 g/dL (ref 30.0–36.0)
MCV: 82.6 fL (ref 80.0–100.0)
Monocytes Absolute: 1.9 10*3/uL — ABNORMAL HIGH (ref 0.1–1.0)
Monocytes Relative: 15 %
Neutro Abs: 8.1 10*3/uL — ABNORMAL HIGH (ref 1.7–7.7)
Neutrophils Relative %: 65 %
Platelets: 372 10*3/uL (ref 150–400)
RBC: 4.2 MIL/uL — ABNORMAL LOW (ref 4.22–5.81)
RDW: 17.5 % — ABNORMAL HIGH (ref 11.5–15.5)
WBC: 12.6 10*3/uL — ABNORMAL HIGH (ref 4.0–10.5)
nRBC: 0 % (ref 0.0–0.2)

## 2022-04-07 LAB — GLUCOSE, CAPILLARY
Glucose-Capillary: 127 mg/dL — ABNORMAL HIGH (ref 70–99)
Glucose-Capillary: 146 mg/dL — ABNORMAL HIGH (ref 70–99)
Glucose-Capillary: 134 mg/dL — ABNORMAL HIGH (ref 70–99)
Glucose-Capillary: 91 mg/dL (ref 70–99)
Glucose-Capillary: 116 mg/dL — ABNORMAL HIGH (ref 70–99)
Glucose-Capillary: 110 mg/dL — ABNORMAL HIGH (ref 70–99)

## 2022-04-07 LAB — PROCALCITONIN: Procalcitonin: 0.14 ng/mL

## 2022-04-07 MED ORDER — SODIUM CHLORIDE 0.9 % IV SOLN
3.0000 g | Freq: Four times a day (QID) | INTRAVENOUS | Status: DC
  Administered 2022-04-07 – 2022-04-11 (×15): 3 g via INTRAVENOUS
  Filled 2022-04-07 (×16): qty 8

## 2022-04-07 MED ORDER — LORAZEPAM 2 MG/ML IJ SOLN
2.0000 mg | Freq: Once | INTRAMUSCULAR | Status: AC
  Administered 2022-04-07: 2 mg via INTRAVENOUS
  Filled 2022-04-07: qty 1

## 2022-04-07 NOTE — Progress Notes (Signed)
Subjective: Febrile with Tmax 101 Fahrenheit overnight. ? ?ROS: Unable to obtain due to poor mental status ? ?Examination ? ?Vital signs in last 24 hours: ?Temp:  [97.7 ?F (36.5 ?C)-101 ?F (38.3 ?C)] 97.9 ?F (36.6 ?C) (05/03 1155) ?Pulse Rate:  [83-117] 83 (05/03 1155) ?Resp:  [19-27] 19 (05/03 1155) ?BP: (113-132)/(58-72) 119/58 (05/03 1155) ?SpO2:  [92 %-99 %] 92 % (05/03 1155) ?Weight:  [79.1 kg] 79.1 kg (05/03 0500) ? ?  ?General: lying in bed, not in apparent distress ?Neuro: Winces to noxious stimuli but did not open his eyes, when I attempted to open patient's eyes he resisted opening, pupils did appear equal and reactive to light, did not see any forced gaze deviation, did not follow commands but did tell me that his name is "Frederick [name]", withdraws to noxious stimuli in all 4 extremities ?  ? ?Basic Metabolic Panel: ?Recent Labs  ?Lab 04/02/22 ?0415 04/04/22 ?0145 04/06/22 ?0300  ?NA 136 134* 134*  ?K 4.0 4.4 4.3  ?CL 101 100 98  ?CO2 28 27 27   ?GLUCOSE 125* 122* 131*  ?BUN 24* 30* 39*  ?CREATININE 0.79 0.86 0.99  ?CALCIUM 9.6 9.5 9.3  ?MG 1.5* 1.7 2.0  ? ? ?CBC: ?Recent Labs  ?Lab 04/02/22 ?0415 04/04/22 ?0145 04/06/22 ?0300 04/07/22 ?1111  ?WBC 9.5 11.2* 11.3* 12.6*  ?NEUTROABS  --   --   --  8.1*  ?HGB 11.6* 11.5* 11.9* 11.3*  ?HCT 35.3* 36.3* 37.0* 34.7*  ?MCV 81.7 82.3 81.7 82.6  ?PLT 378 427* 392 372  ? ? ? ?Coagulation Studies: ?No results for input(s): LABPROT, INR in the last 72 hours. ? ?Imaging ?No new brain imaging overnight ?  ?ASSESSMENT AND PLAN: 61 year old male who presented with focal convulsive status epilepticus (left upper extremity twitching) in the setting of hypoglycemia.  EEG showed right hemispheric status epilepticus which has gradually improved. ?   ?Focal convulsive status epilepticus, resolved ?Acute encephalopathy ?Chronic strokes ?Fever ?- No clinical seizures but encephalopathy could be due to medications versus seizure recurrence versus delirium ?  ?Recommendations ?-No seizures  overnight so we will discontinue LTM EEG ?-Continue Vimpat 150 mg twice daily, Keppra 1000 mg twice daily ?-We will obtain MRI brain without contrast to look for any acute abnormality ?-Defer further work-up for fever to primary team ?-Continue seizure precautions ?-Discussed plan with Dr. 77 ?   ?I have spent a total of 37 minutes with the patient reviewing hospital notes, test results, labs and examining the patient as well as establishing an assessment and plan.  > 50% of time was spent in direct patient care.  ? ?Blake Divine ?Epilepsy ?[facility] ?For questions after 5pm please refer to AMION to reach the Neurologist on call ? ?

## 2022-04-07 NOTE — Progress Notes (Signed)
Pharmacy Antibiotic Note ? ?Frederick [name] is a 62 y.o. male admitted on 03/19/2022 with pneumonia.  Pharmacy has been consulted for unasyn dosing. ? ?Pt recently got treated with a course of abx for PNA rom 4/15>>4/22. He has spiked new fever. Unasyn has been re-ordered. ? ?Wbc 11.2, scr <1 ? ?Plan: ?Unasyn 3g IV q8 ?F/u with cultures and LOT ? ?Height: 6\' 1"  (185.4 cm) ?Weight: 79.1 kg (174 lb 6.1 oz) ?IBW/kg (Calculated) : 79.9 ? ?Temp (24hrs), Avg:99.2 ?F (37.3 ?C), Min:97.7 ?F (36.5 ?C), Max:101 ?F (38.3 ?C) ? ?Recent Labs  ?Lab 04/02/22 ?0415 04/04/22 ?0145 04/06/22 ?0300 04/07/22 ?1111  ?WBC 9.5 11.2* 11.3* 12.6*  ?CREATININE 0.79 0.86 0.99  --   ?  ?Estimated Creatinine Clearance: 87.7 mL/min (by C-G formula based on SCr of 0.99 mg/dL).   ? ?No Known Allergies ? ?Antimicrobials this admission: ?CTX 4/15>> 4/17 ?Unasyn 4/17 >> 4/17 ?Amox 4/19 >> 4/22 ? ?Dose adjustments this admission: ? ? ?Microbiology results: ?4/15 Resp cx: normal flora ?4/15 UCx: >100K E.faecalis (pan sens) ?4/15 MRSA PCR: neg ?5/3 blood>> ? ?7/3, PharmD, BCIDP, AAHIVP, CPP ?Infectious Disease Pharmacist ?04/07/2022 4:42 PM ? ? ? ? ?

## 2022-04-07 NOTE — Progress Notes (Signed)
Nutrition Follow-up ? ?DOCUMENTATION CODES:  ? ?Non-severe (moderate) malnutrition in context of social or environmental circumstances ? ?INTERVENTION:  ? ?Continue tube feeds via Cortrak tube: ?- Jevity 1.5 @ 60 ml/hr (1440 ml/day) ?- ProSource TF 45 ml BID ? ?Tube feeding regimen provides 2240 kcal, 114 grams of protein, and 1094 ml of H2O. ? ?- RD will monitor for diet advancement and adjust tube feeds as appropriate. If pt remains NPO and not making progress toward diet advancement, recommend considering PEG placement. ? ?- Continue MVI with minerals daily per tube ? ?NUTRITION DIAGNOSIS:  ? ?Moderate Malnutrition related to social / environmental circumstances (alcohol abuse) as evidenced by mild fat depletion, mild muscle depletion, moderate muscle depletion. ? ?Ongoing, being addressed via TF ? ?GOAL:  ? ?Patient will meet greater than or equal to 90% of their needs ? ?Met via TF ? ?MONITOR:  ? ?Diet advancement, Labs, Weight trends, TF tolerance, I & O's ? ?REASON FOR ASSESSMENT:  ? ?Ventilator, Consult ?Enteral/tube feeding initiation and management ? ?ASSESSMENT:  ? ?61 yo male admitted with witnessed seizures. PMH includes seizure, heavy alcohol abuse, asthma, HTN. ? ?04/26 - Cortrak placed (tip gastric) ? ?Met with pt at bedside. Pt sleeping at time of RD visit with overnight EEG running to evaluate for seizures. Per notes, pt remains confused. Tube feeds infusing at goal rate via Cortrak and tolerating without difficulty. Pt still having type 6 and type 7 bowel movements despite being on fiber-containing formula. ? ?RD will continue with current tube feeding regimen. Pt continues to work with SLP toward diet advancement but remains NPO at this time. If pt unable to progress toward dieta advancement, recommend considering PEG tube placement. ? ?Admission weight: 78.9 kg ?Current weight: 79.1 kg ? ?Current TF: Jevity 1.5 @ 60 ml/hr, ProSource TF 45 ml BID ? ?Medications reviewed and include: folic acid,  MVI with minerals, thiamine ? ?Labs reviewed: sodium 134, BUN 39, WBC 11.3 ?CBG's: 106-146 x 24 hours ? ?UOP: 1325 ml x 24 hours ? ?Diet Order:   ?Diet Order   ? ?       ?  Diet NPO time specified  Diet effective now       ?  ? ?  ?  ? ?  ? ? ?EDUCATION NEEDS:  ? ?No education needs have been identified at this time ? ?Skin:  Skin Assessment: Reviewed RN Assessment ? ?Last BM:  04/07/22 rectal tube ? ?Height:  ? ?Ht Readings from Last 1 Encounters:  ?03/19/22 '6\' 1"'  (1.854 m)  ? ? ?Weight:  ? ?Wt Readings from Last 1 Encounters:  ?04/07/22 79.1 kg  ? ? ?BMI:  Body mass index is 23.01 kg/m?. ? ?Estimated Nutritional Needs:  ? ?Kcal:  2200-2400 kcal/d ? ?Protein:  110-125 gm ? ?Fluid:  2.2-2.4 L/d ? ? ? ?Gustavus Bryant, MS, RD, LDN ?Inpatient Clinical Dietitian ?Please see AMiON for contact information. ? ?

## 2022-04-07 NOTE — Progress Notes (Signed)
EEG D/C'd. Patient had no skin break down. [facility] notified.  

## 2022-04-07 NOTE — Procedures (Addendum)
Patient Name: Frederick [name]  ?MRN: [id number]  ?Epilepsy Attending: Charlsie Quest  ?Referring Physician/Provider: Charlsie Quest, MD  ?Duration: 04/06/2022 1357 to 04/07/2022 1316 ?  ?Patient history: 61 y.o. male with a history of encephalopathy who is undergoing an EEG to evaluate for seizures. ?  ?Level of alertness: lethargic, asleep ?  ?AEDs during EEG study: LEV, LCM, Phenobarb, Perampanel ?  ?Technical aspects: This EEG study was done with scalp electrodes positioned according to the 10-20 International system of electrode placement. Electrical activity was acquired at a sampling rate of 500Hz  and reviewed with a high frequency filter of 70Hz  and a low frequency filter of 1Hz . EEG data were recorded continuously and digitally stored.  ?  ?Description: During awake state, EEG showed continuous generalized and maximal right centro- parietal region 3-6hz  theta-delta slowing. Sleep was characterized by sleep spindles (12-14hz ), maximal frontocentral region. Spikes were noted in right centro-parietal region, maximal C4/P4/P8 which appeared periodic at 1.5 to 2 Hz when patient is awake/stimulated.  Hyperventilation and photic stimulation were not performed.    ?  ?ABNORMALITY ?- Spike, right centro-parietal region, maximal C4/P4/P8 ?- Continuous slow, generalized and maximal right centro-parietal region ?  ?IMPRESSION: ?This study is consistent with epilepsy and cortical dysfunction arising from right centro-parietal region likely due to underlying structural abnormality.  Additionally there is moderate to severe diffuse encephalopathy, non specific etiology. No definite seizures were seen during this study. ?   ?  ?

## 2022-04-07 NOTE — Progress Notes (Signed)
?   04/06/22 2316  ?Assess: MEWS Score  ?Temp (!) 101 ?F (38.3 ?C)  ?BP 131/72  ?Pulse Rate (!) 117  ?ECG Heart Rate (!) 118  ?Resp (!) 27  ?SpO2 94 %  ?O2 Device Nasal Cannula  ?Assess: MEWS Score  ?MEWS Temp 1  ?MEWS Systolic 0  ?MEWS Pulse 2  ?MEWS RR 2  ?MEWS LOC 0  ?MEWS Score 5  ?MEWS Score Color Red  ?Assess: if the MEWS score is Yellow or Red  ?Were vital signs taken at a resting state? Yes  ?Focused Assessment No change from prior assessment  ?Early Detection of Sepsis Score *See Row Information* Medium  ?MEWS guidelines implemented *See Row Information* Yes  ?Take Vital Signs  ?Increase Vital Sign Frequency  Red: Q 1hr X 4 then Q 4hr X 4, if remains red, continue Q 4hrs  ?Escalate  ?MEWS: Escalate Red: discuss with charge nurse/RN and provider, consider discussing with RRT  ?Notify: Charge Nurse/RN  ?Name of Charge Nurse/RN Notified Charlsie Quest RN  ?Date Charge Nurse/RN Notified 04/06/22  ?Time Charge Nurse/RN Notified 2330  ?Notify: Provider  ?Provider Name/Title Chinita Greenland NP  ?Date Provider Notified 04/06/22  ?Time Provider Notified 2330  ?Notification Type Page  ?Notification Reason Other (Comment) ?(Red MEWS)  ? ?RN discussed pt. With charge RN and Chinita Greenland NP. Garner Nash encouraged use of PRN tylenol, ice packs, room cool down, and removal of covers. This RN agreed and also increased vital frequency.  ?

## 2022-04-07 NOTE — Progress Notes (Signed)
? ? ? [facility] ?                                                                            ? ? ?Frederick [name], is a 61 y.o. male, DOB - Nov 30, 1961, OIB:[id number] ?Admit date - 03/19/2022    ?Outpatient Primary MD for the patient is [facility] ? ?LOS - 18  days ? ? ? ?Brief summary  ? ?61 y.o. male with history of seizure, alcohol abuse, pulmonary diverticulosis, hypertension who was admitted in February for status epilepticus in the setting of patient being noncompliant with his medication had to be intubated at the time and also had a stroke. He was found to have generalized tonic-clonic activity by patient's family and was brought to the ER.  Initially admitted to the floor.  However patient decompensated and had to be transferred to the ICU.  Had to be intubated again.  Neurology was consulted.  Patient was stabilized.  Subsequently extubated. ? ?Assessment & Plan  ? ? ?Assessment and Plan: ? ?History of seizures admitted with status epilepticus.   ?3 of EtOH abuse and medical noncompliance ?Patient was intubated on admission and extubated.  Neurology on board recommended to continue with Vimpat. ?Overnight LTM EEG is negative and it was discontinued. ? ? ? ?Acute metabolic encephalopathy thought to be secondary to status epilepticus.  Mentation has been very slow to improve still somnolent. ? ? ? ?Fever overnight suspect persistent aspiration pneumonia ?Restarted him back on Unasyn as his leukocytosis has worsened today. ?Blood cultures ordered and pending. ?Patient's urine analysis is negative for infection. ? ? ? ?Acute respiratory failure with hypoxia related to status epilepticus and aspiration pneumonia and urinary tract infection.   ?Patient was extubated, though he completed the course of antibiotics patient continues to have fevers as high as 101. ?CT of the chest without contrast ordered showing debris's in the bronchus in the right middle and lower lobe.  Restart him back on the  Unasyn after discussing with Dr. Rudean Haskell from infectious disease. ? ? ?History of EtOH and tobacco abuse ?Patient currently is on nicotine patch. ? ? ? ?Oropharyngeal dysphagia currently has core track for nutrition.  Patient is currently not safe for oral intake. ?SLP on board and continues to follow. ? ? ?Hypertension ?Blood pressure parameters appear to be optimal.  ? ? ?Mild hyponatremia/hypomagnesemia ?Replaced ? ? ? ?Anemia of chronic disease ?Normocytic ?Continue to monitor. ? ? ? ?Loose stools probably secondary to tube feedings as needed Imodium. ? ? ? ?History of DVT/PE ?Completed course of anticoagulation. ? ? ? ?Moderate protein calorie malnutrition patient currently on tube feeds. ? ? ?RN Pressure Injury Documentation: ?  ? ?Malnutrition Type: ? ?Nutrition Problem: Moderate Malnutrition ?Etiology: social / environmental circumstances (alcohol abuse) ? ? ?Malnutrition Characteristics: ? ?Signs/Symptoms: mild fat depletion, mild muscle depletion, moderate muscle depletion ? ? ?Nutrition Interventions: ? ?Interventions: Tube feeding, Prostat ? ?Estimated body mass index is 23.01 kg/m? as calculated from the following: ?  Height as of this encounter: '6\' 1"'  (1.854 m). ?  Weight as of this encounter: 79.1 kg. ? ?Code Status: Full code ?DVT Prophylaxis:  enoxaparin (LOVENOX) injection 40 mg Start: 03/20/22 1000 ? ? ?  Level of Care: Level of care: Progressive ?Family Communication: None at bedside ? ?Disposition Plan:     Remains inpatient appropriate: Still encephalopathic and febrile ? ?Procedures:  ?LTM EEG ? ?Consultants:   ?Neurology ? ?Antimicrobials:  ? ?Anti-infectives (From admission, onward)  ? ? Start     Dose/Rate Route Frequency Ordered Stop  ? 03/24/22 1000  amoxicillin (AMOXIL) capsule 500 mg       ? 500 mg Per Tube Every 8 hours 03/24/22 0851 03/27/22 2121  ? 03/22/22 1000  Ampicillin-Sulbactam (UNASYN) 3 g in sodium chloride 0.9 % 100 mL IVPB  Status:  Discontinued       ? 3 g ?200 mL/hr over 30  Minutes Intravenous Every 6 hours 03/22/22 0842 03/24/22 0851  ? 03/20/22 1700  cefTRIAXone (ROCEPHIN) 2 g in sodium chloride 0.9 % 100 mL IVPB  Status:  Discontinued       ? 2 g ?200 mL/hr over 30 Minutes Intravenous Every 24 hours 03/20/22 1611 03/22/22 0842  ? ?  ? ? ? ?Medications ? ?Scheduled Meds: ? aspirin  300 mg Rectal Daily  ? Or  ? aspirin  325 mg Per Tube Daily  ? carvedilol  12.5 mg Per Tube BID WC  ? chlorhexidine gluconate (MEDLINE KIT)  15 mL Mouth Rinse BID  ? Chlorhexidine Gluconate Cloth  6 each Topical Daily  ? cilostazol  100 mg Per Tube BID  ? enoxaparin (LOVENOX) injection  40 mg Subcutaneous Q24H  ? feeding supplement (PROSource TF)  45 mL Per Tube BID  ? folic acid  1 mg Per Tube Daily  ? lacosamide  150 mg Per Tube BID  ? levETIRAcetam  1,000 mg Per Tube BID  ? LORazepam  2 mg Intravenous Once  ? multivitamin with minerals  1 tablet Per Tube Daily  ? nicotine  14 mg Transdermal Daily  ? QUEtiapine  12.5 mg Per Tube QHS  ? thiamine  100 mg Per Tube Daily  ? ?Continuous Infusions: ? sodium chloride Stopped (04/03/22 1035)  ? feeding supplement (JEVITY 1.5 CAL/FIBER) 1,000 mL (04/07/22 0009)  ? ?PRN Meds:.acetaminophen **OR** acetaminophen (TYLENOL) oral liquid 160 mg/5 mL **OR** acetaminophen, docusate, haloperidol lactate, hydrALAZINE, loperamide HCl, polyethylene glycol ? ? ? ?Subjective:  ? ?Frederick [name] was seen and examined today.  No seizures overnight.   ? ?Objective:  ? ?Vitals:  ? 04/07/22 0500 04/07/22 0714 04/07/22 1155 04/07/22 1534  ?BP:  121/65 (!) 119/58 121/64  ?Pulse:  91 83 88  ?Resp:  '20 19 18  ' ?Temp:  97.7 ?F (36.5 ?C) 97.9 ?F (36.6 ?C) 97.9 ?F (36.6 ?C)  ?TempSrc:  Oral Axillary Oral  ?SpO2:  94% 92% 95%  ?Weight: 79.1 kg     ?Height:      ? ? ?Intake/Output Summary (Last 24 hours) at 04/07/2022 1619 ?Last data filed at 04/07/2022 1024 ?Gross per 24 hour  ?Intake --  ?Output 1200 ml  ?Net -1200 ml  ? ?Filed Weights  ? 04/05/22 0500 04/06/22 0500 04/07/22 0500  ?Weight: 78.3  kg 78.1 kg 79.1 kg  ? ? ? ?Exam ?General exam: Appears calm and comfortable  ?Respiratory system: Clear to auscultation. Respiratory effort normal. ?Cardiovascular system: S1 & S2 heard, RRR. No JVD,  No pedal edema. ?Gastrointestinal system: Abdomen is nondistended, soft and nontender. Normal bowel sounds heard. ?Central nervous system: somnolent, responding to noxious stimuli.  ?Extremities: no pedal edema.  ?Skin: No rashes, lesions or ulcers ?Psychiatry: unable to assess.  ? ? ? ?  Data Reviewed:  I have personally reviewed following labs and imaging studies ? ? ?CBC ?Lab Results  ?Component Value Date  ? WBC 12.6 (H) 04/07/2022  ? RBC 4.20 (L) 04/07/2022  ? HGB 11.3 (L) 04/07/2022  ? HCT 34.7 (L) 04/07/2022  ? MCV 82.6 04/07/2022  ? MCH 26.9 04/07/2022  ? PLT 372 04/07/2022  ? MCHC 32.6 04/07/2022  ? RDW 17.5 (H) 04/07/2022  ? LYMPHSABS 1.9 04/07/2022  ? MONOABS 1.9 (H) 04/07/2022  ? EOSABS 0.6 (H) 04/07/2022  ? BASOSABS 0.1 04/07/2022  ? ? ? ?Last metabolic panel ?Lab Results  ?Component Value Date  ? NA 134 (L) 04/06/2022  ? K 4.3 04/06/2022  ? CL 98 04/06/2022  ? CO2 27 04/06/2022  ? BUN 39 (H) 04/06/2022  ? CREATININE 0.99 04/06/2022  ? GLUCOSE 131 (H) 04/06/2022  ? GFRNONAA >60 04/06/2022  ? GFRAA >60 04/27/2020  ? CALCIUM 9.3 04/06/2022  ? PHOS 4.5 03/24/2022  ? PROT 7.5 04/02/2022  ? ALBUMIN 2.8 (L) 04/02/2022  ? BILITOT 0.3 04/02/2022  ? ALKPHOS 107 04/02/2022  ? AST 27 04/02/2022  ? ALT 28 04/02/2022  ? ANIONGAP 9 04/06/2022  ? ? ?CBG (last 3)  ?Recent Labs  ?  04/07/22 ?0748 04/07/22 ?1154 04/07/22 ?1521  ?GLUCAP 146* 134* 91  ?  ? ? ?Coagulation Profile: ?No results for input(s): INR, PROTIME in the last 168 hours. ? ? ?Radiology Studies: ?CT CHEST WO CONTRAST ? ?Result Date: 04/07/2022 ?CLINICAL DATA:  Pneumonia, fever. EXAM: CT CHEST WITHOUT CONTRAST TECHNIQUE: Multidetector CT imaging of the chest was performed following the standard protocol without IV contrast. RADIATION DOSE REDUCTION: This exam  was performed according to the departmental dose-optimization program which includes automated exposure control, adjustment of the mA and/or kV according to patient size and/or use of iterative reconstruction

## 2022-04-08 LAB — GLUCOSE, CAPILLARY
Glucose-Capillary: 110 mg/dL — ABNORMAL HIGH (ref 70–99)
Glucose-Capillary: 126 mg/dL — ABNORMAL HIGH (ref 70–99)
Glucose-Capillary: 140 mg/dL — ABNORMAL HIGH (ref 70–99)
Glucose-Capillary: 130 mg/dL — ABNORMAL HIGH (ref 70–99)
Glucose-Capillary: 93 mg/dL (ref 70–99)

## 2022-04-08 NOTE — Progress Notes (Signed)
? ? ? [facility] ?                                                                            ? ? ?Frederick [name], is a 61 y.o. male, DOB - 10-Oct-1961, [id number] ?Admit date - 03/19/2022    ?Outpatient Primary MD for the patient is [facility] ? ?LOS - 19  days ? ? ? ?Brief summary  ? ?61 y.o. male with history of seizure, alcohol abuse, pulmonary diverticulosis, hypertension who was admitted in February for status epilepticus in the setting of patient being noncompliant with his medication had to be intubated at the time and also had a stroke. He was found to have generalized tonic-clonic activity by patient's family and was brought to the ER.  Initially admitted to the floor.  However patient decompensated and had to be transferred to the ICU.  Had to be intubated again.  Neurology was consulted.  Patient was stabilized.  Subsequently extubated. ? ?Assessment & Plan  ? ? ?Assessment and Plan: ? ?History of seizures admitted with status epilepticus.   ?History of EtOH abuse and medical noncompliance ?Patient was intubated on admission and extubated.   ?Neurology on board recommended to continue with Vimpat., keppra 1000 mg BID,. ?MRI brain without contrast shows chronic microvascular disease.  ? ? ? ? ?Acute metabolic encephalopathy thought to be secondary to status epilepticus.  Mentation has been very slow to improve still somnolent. ?Pt is more alert today and requesting for water.  ? ? ? ?Fever overnight suspect persistent aspiration pneumonia ?Restarted him back on Unasyn as his leukocytosis has worsened today. ?Blood cultures ordered and pending. ?Patient's urine analysis is negative for infection. ? ? ? ?Acute respiratory failure with hypoxia related to status epilepticus and aspiration pneumonia and urinary tract infection.   ?Patient was extubated, though he completed the course of antibiotics patient continues to have fevers, last fever on 04/07/22,.  ?CT of the chest without contrast  ordered showing debris's in the bronchus in the right middle and lower lobe.  Restart him back on the Unasyn after discussing with Dr. Linus Salmons from infectious disease. ? ? ?History of EtOH and tobacco abuse ?Patient currently is on nicotine patch. ? ? ? ?Oropharyngeal dysphagia currently has core track for nutrition.  Patient is currently not safe for oral intake. ?SLP on board and continues to follow. Will request SLP today to evaluate the patient.  ? ? ?Hypertension ?Blood pressure parameters are well controlled.  ? ? ?Mild hyponatremia/hypomagnesemia ?Sodium of 134.  ?Magnesium level wnl.  ? ? ? ?Anemia of chronic disease ?Normocytic ?Continue to monitor. ? ? ? ?Loose stools probably secondary to tube feedings as needed Imodium. ? ? ? ?History of DVT/PE ?Completed course of anticoagulation. ? ? ? ?Moderate protein calorie malnutrition patient currently on tube feeds. ? ? ?Malnutrition Type: ? ?Nutrition Problem: Moderate Malnutrition ?Etiology: social / environmental circumstances (alcohol abuse) ? ? ?Malnutrition Characteristics: ? ?Signs/Symptoms: mild fat depletion, mild muscle depletion, moderate muscle depletion ? ? ?Nutrition Interventions: ? ?Interventions: Tube feeding, Prostat ? ?Estimated body mass index is 21.58 kg/m? as calculated from the following: ?  Height as of this encounter: '6\' 1"'  (1.854 m). ?  Weight as of this encounter: 74.2 kg. ? ?Code Status: Full code ?DVT Prophylaxis:  enoxaparin (LOVENOX) injection 40 mg Start: 03/20/22 1000 ? ? ?Level of Care: Level of care: Progressive ?Family Communication: None at bedside ? ?Disposition Plan:     Remains inpatient appropriate: encephalopathic.  ? ?Procedures:  ?LTM EEG ? ?Consultants:   ?Neurology ? ?Antimicrobials:  ? ?Anti-infectives (From admission, onward)  ? ? Start     Dose/Rate Route Frequency Ordered Stop  ? 04/07/22 1730  Ampicillin-Sulbactam (UNASYN) 3 g in sodium chloride 0.9 % 100 mL IVPB       ? 3 g ?200 mL/hr over 30 Minutes Intravenous  Every 6 hours 04/07/22 1638    ? 03/24/22 1000  amoxicillin (AMOXIL) capsule 500 mg       ? 500 mg Per Tube Every 8 hours 03/24/22 0851 03/27/22 2121  ? 03/22/22 1000  Ampicillin-Sulbactam (UNASYN) 3 g in sodium chloride 0.9 % 100 mL IVPB  Status:  Discontinued       ? 3 g ?200 mL/hr over 30 Minutes Intravenous Every 6 hours 03/22/22 0842 03/24/22 0851  ? 03/20/22 1700  cefTRIAXone (ROCEPHIN) 2 g in sodium chloride 0.9 % 100 mL IVPB  Status:  Discontinued       ? 2 g ?200 mL/hr over 30 Minutes Intravenous Every 24 hours 03/20/22 1611 03/22/22 0842  ? ?  ? ? ? ?Medications ? ?Scheduled Meds: ? aspirin  300 mg Rectal Daily  ? Or  ? aspirin  325 mg Per Tube Daily  ? carvedilol  12.5 mg Per Tube BID WC  ? chlorhexidine gluconate (MEDLINE KIT)  15 mL Mouth Rinse BID  ? Chlorhexidine Gluconate Cloth  6 each Topical Daily  ? cilostazol  100 mg Per Tube BID  ? enoxaparin (LOVENOX) injection  40 mg Subcutaneous Q24H  ? feeding supplement (PROSource TF)  45 mL Per Tube BID  ? folic acid  1 mg Per Tube Daily  ? lacosamide  150 mg Per Tube BID  ? levETIRAcetam  1,000 mg Per Tube BID  ? multivitamin with minerals  1 tablet Per Tube Daily  ? nicotine  14 mg Transdermal Daily  ? QUEtiapine  12.5 mg Per Tube QHS  ? thiamine  100 mg Per Tube Daily  ? ?Continuous Infusions: ? sodium chloride Stopped (04/03/22 1035)  ? ampicillin-sulbactam (UNASYN) IV 3 g (04/08/22 0610)  ? feeding supplement (JEVITY 1.5 CAL/FIBER) 1,000 mL (04/07/22 2108)  ? ?PRN Meds:.acetaminophen **OR** acetaminophen (TYLENOL) oral liquid 160 mg/5 mL **OR** acetaminophen, docusate, haloperidol lactate, hydrALAZINE, loperamide HCl, polyethylene glycol ? ? ? ?Subjective:  ? ?Frederick [name] was seen and examined today. More alert today and requesting water.  ? ?Objective:  ? ?Vitals:  ? 04/08/22 0232 04/08/22 0960 04/08/22 4540 04/08/22 0714  ?BP:  94/61 122/62 130/71  ?Pulse:  94  99  ?Resp:  20  (!) 21  ?Temp:  97.7 ?F (36.5 ?C)  97.6 ?F (36.4 ?C)  ?TempSrc:   Axillary  Oral  ?SpO2:  95%  97%  ?Weight: 74.2 kg     ?Height:      ? ? ?Intake/Output Summary (Last 24 hours) at 04/08/2022 1019 ?Last data filed at 04/08/2022 0306 ?Gross per 24 hour  ?Intake 200 ml  ?Output 850 ml  ?Net -650 ml  ? ? ?Filed Weights  ? 04/06/22 0500 04/07/22 0500 04/08/22 0232  ?Weight: 78.1 kg 79.1 kg 74.2 kg  ? ? ? ?Exam ?General exam: Appears calm and  comfortable  ?Respiratory system: Clear to auscultation. Respiratory effort normal. ?Cardiovascular system: S1 & S2 heard, RRR. No JVD, No pedal edema. ?Gastrointestinal system: Abdomen is nondistended, soft and nontender. Normal bowel sounds heard. ?Central nervous system: Alert, requesting for water.  ?Extremities: Symmetric 5 x 5 power. ?Skin: No rashes,  ?Psychiatry: unable to assess.  ? ? ? ? ?Data Reviewed:  I have personally reviewed following labs and imaging studies ? ? ?CBC ?Lab Results  ?Component Value Date  ? WBC 12.6 (H) 04/07/2022  ? RBC 4.20 (L) 04/07/2022  ? HGB 11.3 (L) 04/07/2022  ? HCT 34.7 (L) 04/07/2022  ? MCV 82.6 04/07/2022  ? MCH 26.9 04/07/2022  ? PLT 372 04/07/2022  ? MCHC 32.6 04/07/2022  ? RDW 17.5 (H) 04/07/2022  ? LYMPHSABS 1.9 04/07/2022  ? MONOABS 1.9 (H) 04/07/2022  ? EOSABS 0.6 (H) 04/07/2022  ? BASOSABS 0.1 04/07/2022  ? ? ? ?Last metabolic panel ?Lab Results  ?Component Value Date  ? NA 134 (L) 04/06/2022  ? K 4.3 04/06/2022  ? CL 98 04/06/2022  ? CO2 27 04/06/2022  ? BUN 39 (H) 04/06/2022  ? CREATININE 0.99 04/06/2022  ? GLUCOSE 131 (H) 04/06/2022  ? GFRNONAA >60 04/06/2022  ? GFRAA >60 04/27/2020  ? CALCIUM 9.3 04/06/2022  ? PHOS 4.5 03/24/2022  ? PROT 7.5 04/02/2022  ? ALBUMIN 2.8 (L) 04/02/2022  ? BILITOT 0.3 04/02/2022  ? ALKPHOS 107 04/02/2022  ? AST 27 04/02/2022  ? ALT 28 04/02/2022  ? ANIONGAP 9 04/06/2022  ? ? ?CBG (last 3)  ?Recent Labs  ?  04/07/22 ?2310 04/08/22 ?0316 04/08/22 ?0739  ?GLUCAP 110* 110* 126*  ? ?  ? ? ?Coagulation Profile: ?No results for input(s): INR, PROTIME in the last 168  hours. ? ? ?Radiology Studies: ?CT CHEST WO CONTRAST ? ?Result Date: 04/07/2022 ?CLINICAL DATA:  Pneumonia, fever. EXAM: CT CHEST WITHOUT CONTRAST TECHNIQUE: Multidetector CT imaging of the chest was performed following the

## 2022-04-08 NOTE — Progress Notes (Signed)
SLP Cancellation Note ? ?Patient Details ?Name: Frederick [name] ?MRN: [id number] ?DOB: Mar 21, 1961 ? ? ?Cancelled treatment:       Reason Eval/Treat Not Completed: Fatigue/lethargy limiting ability to participate;Per RN, pt with recent dose of ativan prior to MRI this am. SLP to continue efforts. ? ? ? ?Avie Echevaria, MA, CCC-SLP ?Acute Rehabilitation Services ?Office Number: [phone] ? ?[name] ?04/08/2022, 9:43 AM ?

## 2022-04-08 NOTE — Progress Notes (Signed)
Subjective: NAEO. Requesting to go home ? ?ROS: negative except above ? ?Examination ? ?Vital signs in last 24 hours: ?Temp:  [97.6 ?F (36.4 ?C)-99.2 ?F (37.3 ?C)] 98 ?F (36.7 ?C) (05/04 1205) ?Pulse Rate:  [84-99] 88 (05/04 1205) ?Resp:  [16-21] 20 (05/04 1205) ?BP: (94-130)/(60-78) 115/65 (05/04 1205) ?SpO2:  [95 %-98 %] 98 % (05/04 1205) ?Weight:  [74.2 kg] 74.2 kg (05/04 0232) ? ?General: lying in bed, NAD ?Neuro:  Awake, alert, able to tell me his name and that he is at hospital, dysarthric speech, follows simple one-step commands, PERRLA, EOMI,  antigravity strength in all 4 extremities ?  ?Basic Metabolic Panel: ?Recent Labs  ?Lab 04/02/22 ?0415 04/04/22 ?0145 04/06/22 ?0300  ?NA 136 134* 134*  ?K 4.0 4.4 4.3  ?CL 101 100 98  ?CO2 28 27 27   ?GLUCOSE 125* 122* 131*  ?BUN 24* 30* 39*  ?CREATININE 0.79 0.86 0.99  ?CALCIUM 9.6 9.5 9.3  ?MG 1.5* 1.7 2.0  ? ? ?CBC: ?Recent Labs  ?Lab 04/02/22 ?0415 04/04/22 ?0145 04/06/22 ?0300 04/07/22 ?1111  ?WBC 9.5 11.2* 11.3* 12.6*  ?NEUTROABS  --   --   --  8.1*  ?HGB 11.6* 11.5* 11.9* 11.3*  ?HCT 35.3* 36.3* 37.0* 34.7*  ?MCV 81.7 82.3 81.7 82.6  ?PLT 378 427* 392 372  ? ? ? ?Coagulation Studies: ?No results for input(s): LABPROT, INR in the last 72 hours. ? ?Imaging ?MR Brain wo contrast 04/07/2022: No acute intracranial abnormality. Findings of chronic microvascular ischemia and generalized cerebral volume loss. ? ?ASSESSMENT AND PLAN: 61 year old male who presented with focal convulsive status epilepticus (left upper extremity twitching) in the setting of hypoglycemia.  EEG showed right hemispheric status epilepticus which has gradually improved. ?   ?Focal convulsive status epilepticus, resolved ?Acute encephalopathy due to infection, improving ?Chronic strokes ?Fever, resolved ?Aspiration PNA ?- Improving, no new concerns ?  ?Recommendations ?-Continue Vimpat 150 mg twice daily, Keppra 1000 mg twice daily ?-Continue seizure precautions ?-PRN IV ativan 2mg  for clinical  seizure ?- F/u with Neuro in 2-3 months ?-Discussed plan with Dr. Karleen Hampshire ? ?Seizure precautions: ?Per [facility] statutes, patients with seizures are not allowed to drive until they have been seizure-free for six months and cleared by a physician  ?  ?Use caution when using heavy equipment or power tools. Avoid working on ladders or at heights. Take showers instead of baths. Ensure the water temperature is not too high on the home water heater. Do not go swimming alone. Do not lock yourself in a room alone (i.e. bathroom). When caring for infants or small children, sit down when holding, feeding, or changing them to minimize risk of injury to the child in the event you have a seizure. Maintain good sleep hygiene. Avoid alcohol.  ?  ?If patient has another seizure, call 911 and bring them back to the ED if: ?A.  The seizure lasts longer than 5 minutes.      ?B.  The patient doesn't wake shortly after the seizure or has new problems such as difficulty seeing, speaking or moving following the seizure ?C.  The patient was injured during the seizure ?D.  The patient has a temperature over 102 F (39C) ?E.  The patient vomited during the seizure and now is having trouble breathing ?   ?During the Seizure ?  ?- First, ensure adequate ventilation and place patients on the floor on their left side  ?Loosen clothing around the neck and ensure the airway is patent.  If the patient is clenching the teeth, do not force the mouth open with any object as this can cause severe damage ?- Remove all items from the surrounding that can be hazardous. The patient may be oblivious to what's happening and may not even know what he or she is doing. ?If the patient is confused and wandering, either gently guide him/her away and block access to outside areas ?- Reassure the individual and be comforting ?- Call 911. In most cases, the seizure ends before EMS arrives. However, there are cases when seizures may last over 3 to 5 minutes. Or  the individual may have developed breathing difficulties or severe injuries. If a pregnant patient or a person with diabetes develops a seizure, it is prudent to call an ambulance. ?- Finally, if the patient does not regain full consciousness, then call EMS. Most patients will remain confused for about 45 to 90 minutes after a seizure, so you must use judgment in calling for help. ?  ? After the Seizure (Postictal Stage) ?  ?After a seizure, most patients experience confusion, fatigue, muscle pain and/or a headache. Thus, one should permit the individual to sleep. For the next few days, reassurance is essential. Being calm and helping reorient the person is also of importance. ?  ?Most seizures are painless and end spontaneously. Seizures are not harmful to others but can lead to complications such as stress on the lungs, brain and the heart. Individuals with prior lung problems may develop labored breathing and respiratory distress.  ? ?I have spent a total of 26 minutes with the patient reviewing hospital notes, test results, labs and examining the patient as well as establishing an assessment and plan.  > 50% of time was spent in direct patient care.  ?  ?Zeb Comfort ?Epilepsy ?[facility] ?For questions after 5pm please refer to AMION to reach the Neurologist on call ? ?

## 2022-04-08 NOTE — Progress Notes (Signed)
Occupational Therapy Treatment ?Patient Details ?Name: Frederick [name] ?MRN: [id number] ?DOB: [birth year] ?Today's Date: 04/08/2022 ? ? ?History of present illness 61 y.o. male brought in 03/19/22 due to seizure activity and possible increased weakness on left with incr slurred speech. CT head no acute findings. Intubated 4/15, extubated 4/24. PMH seizure, alcohol abuse, pulmonary diverticulosis, hypertension; admitted in February for seizures due to medication noncompliance and hypoglycemia ?  ?OT comments ? Pt seen in conjunction with PT to maximize pts activity tolerance and participation however pt continues to be resistant to movement and requires total A +2 for all aspects of bed mobility. Pt with impaired sitting balance presenting with R lateral lean and heavy extensor tone. Pt currently requires total A for all ADLS from bed level.Pt would continue to benefit from skilled occupational therapy while admitted and after d/c to address the below listed limitations in order to improve overall functional mobility and facilitate independence with BADL participation. DC plan remains appropriate, will follow acutely per POC.  ?  ? ?Recommendations for follow up therapy are one component of a multi-disciplinary discharge planning process, led by the attending physician.  Recommendations may be updated based on patient status, additional functional criteria and insurance authorization. ?   ?Follow Up Recommendations ? Skilled nursing-short term rehab (<3 hours/day)  ?  ?Assistance Recommended at Discharge Frequent or constant Supervision/Assistance  ?Patient can return home with the following ? Two people to help with walking and/or transfers;Two people to help with bathing/dressing/bathroom;Assistance with cooking/housework;Assistance with feeding;Direct supervision/assist for medications management;Direct supervision/assist for financial management;Assist for transportation;Help with stairs or ramp for entrance ?   ?Equipment Recommendations ? None recommended by OT  ?  ?Recommendations for Other Services   ? ?  ?Precautions / Restrictions Precautions ?Precautions: Fall ?Precaution Comments: flexiseal; cortrack, hand mittens bil, 2L [place] ?Restrictions ?Weight Bearing Restrictions: No  ? ? ?  ? ?Mobility Bed Mobility ?Overal bed mobility: Needs Assistance ?Bed Mobility: Rolling, Supine to Sit, Sit to Supine ?Rolling: Total assist ?  ?Supine to sit: Total assist, HOB elevated, +2 for physical assistance ?Sit to supine: Total assist, HOB elevated, +2 for physical assistance ?  ?General bed mobility comments: pt does not follow commands well to initiate rolling or bed mobility. Pt often pushing against therapists. ?  ? ?Transfers ?  ?  ?  ?  ?  ?  ?  ?  ?  ?General transfer comment: NT ?  ?  ?Balance Overall balance assessment: Needs assistance ?Sitting-balance support: Feet supported, No upper extremity supported, Single extremity supported ?Sitting balance-Leahy Scale: Zero ?Sitting balance - Comments: maxA with lean to R side, pt pushing to R side with LUE or pulling to R with R arm. ?Postural control: Right lateral lean ?  ?  ?  ?  ?  ?  ?  ?  ?  ?  ?  ?  ?  ?  ?   ? ?ADL either performed or assessed with clinical judgement  ? ?ADL Overall ADL's : Needs assistance/impaired ?  ?  ?  ?  ?  ?  ?Lower Body Bathing: Total assistance;Bed level ?  ?Upper Body Dressing : Total assistance;Bed level ?Upper Body Dressing Details (indicate cue type and reason): don new gown ?  ?  ?  ?Toilet Transfer Details (indicate cue type and reason): deferred d/t impaired sitting balance and self limiting ?Toileting- Clothing Manipulation and Hygiene: Total assistance;Bed level ?  ?  ?  ?Functional mobility during ADLs: Total assistance;+2  for physical assistance (bed mobility only) ?General ADL Comments: emphasis on sitting balance, trunk control, and tolerance to activity for ADL participation ?  ? ?Extremity/Trunk Assessment Upper Extremity  Assessment ?Upper Extremity Assessment: Generalized weakness ?  ?Lower Extremity Assessment ?Lower Extremity Assessment: Defer to PT evaluation ?  ?  ?  ? ?Vision   ?Vision Assessment?: Vision impaired- to be further tested in functional context ?Additional Comments: unable to assess secondary to cog ?  ?Perception Perception ?Perception: Not tested (d/t cog) ?  ?Praxis Praxis ?Praxis: Not tested (d/t cog) ?  ? ?Cognition Arousal/Alertness: Awake/alert (moments of lethargy but awakens with stimulation) ?Behavior During Therapy: Agitated, Flat affect (osciliating between the two) ?Overall Cognitive Status: Impaired/Different from baseline ?Area of Impairment: Orientation, Memory, Attention, Following commands, Safety/judgement, Awareness, Problem solving ?  ?  ?  ?  ?  ?  ?  ?  ?Orientation Level: Disoriented to, Time, Situation ?Current Attention Level: Focused ?Memory: Decreased recall of precautions, Decreased short-term memory ?Following Commands: Follows one step commands with increased time, Follows one step commands inconsistently ?Safety/Judgement: Decreased awareness of safety, Decreased awareness of deficits ?Awareness: Intellectual ?Problem Solving: Slow processing, Difficulty sequencing, Decreased initiation, Requires verbal cues, Requires tactile cues ?General Comments: pt following commands intermittent, efforts were made to verbalize but all out of agitation ?  ?  ?   ?Exercises   ? ?  ?Shoulder Instructions   ? ? ?  ?General Comments pt on 2L [place], HR stable in 90s. pt with significant extensor tone and pushing to right side, flexiseal leaking nurse aware  ? ? ?Pertinent Vitals/ Pain       Pain Assessment ?Pain Assessment: Faces ?Faces Pain Scale: Hurts even more ?Pain Location: generalized ?Pain Descriptors / Indicators: Grimacing ?Pain Intervention(s): Monitored during session, Limited activity within patient's tolerance, Repositioned ? ?Home Living   ?  ?  ?  ?  ?  ?  ?  ?  ?  ?  ?  ?  ?  ?  ?  ?  ?   ?  ? ?  ?Prior Functioning/Environment    ?  ?  ?  ?   ? ?Frequency ? Min 2X/week  ? ? ? ? ?  ?Progress Toward Goals ? ?OT Goals(current goals can now be found in the care plan section) ? Progress towards OT goals: Not progressing toward goals - comment (agitation/cog/ self limiting) ? ?Acute Rehab OT Goals ?Patient Stated Goal: to not move ?Time For Goal Achievement: 04/15/22 ?Potential to Achieve Goals: Poor  ?Plan Discharge plan remains appropriate;Frequency remains appropriate   ? ?Co-evaluation ? ? ?   ?Reason for Co-Treatment: Complexity of the patient's impairments (multi-system involvement);For patient/therapist safety;To address functional/ADL transfers;Necessary to address cognition/behavior during functional activity ?PT goals addressed during session: Mobility/safety with mobility;Balance;Strengthening/ROM ?OT goals addressed during session: ADL's and self-care ?  ? ?  ?AM-PAC OT "6 Clicks" Daily Activity     ?Outcome Measure ? ? Help from another person eating meals?: Total ?Help from another person taking care of personal grooming?: Total ?Help from another person toileting, which includes using toliet, bedpan, or urinal?: Total ?Help from another person bathing (including washing, rinsing, drying)?: Total ?Help from another person to put on and taking off regular upper body clothing?: Total ?Help from another person to put on and taking off regular lower body clothing?: Total ?6 Click Score: 6 ? ?  ?End of Session Equipment Utilized During Treatment: Oxygen ? ?OT Visit Diagnosis: Unsteadiness on feet (R26.81);Other  abnormalities of gait and mobility (R26.89);Muscle weakness (generalized) (M62.81);Other symptoms and signs involving the nervous system (R29.898);Other symptoms and signs involving cognitive function;Pain ?Pain - part of body: Shoulder ?  ?Activity Tolerance Treatment limited secondary to agitation;Other (comment) (decreased ability to follow commands/self limiting) ?  ?Patient Left in  bed;with call bell/phone within reach;with bed alarm set ?  ?Nurse Communication Mobility status ?  ? ?   ? ?Time: 9147-8295 ?OT Time Calculation (min): 23 min ? ?Charges: OT General Charges ?$OT Visit: 1 Visit ?OT Tre

## 2022-04-08 NOTE — Progress Notes (Addendum)
0038 Pt. Off the floor with MRI ? ?0200 pt. Back on the floor from MRI. VS stable ? ?

## 2022-04-08 NOTE — Progress Notes (Signed)
Physical Therapy Treatment ?Patient Details ?Name: Frederick [name] ?MRN: [id number] ?DOB: February 24, 1961 ?Today's Date: 04/08/2022 ? ? ?History of Present Illness 61 y.o. male brought in 03/19/22 due to seizure activity and possible increased weakness on left with incr slurred speech. CT head no acute findings. Intubated 4/15, extubated 4/24. PMH seizure, alcohol abuse, pulmonary diverticulosis, hypertension; admitted in February for seizures due to medication noncompliance and hypoglycemia ? ?  ?PT Comments  ? ? Pt remains limited by impaired cognition and awareness. Pt with strong R lateral lean, pushing to R side with LUE and pulling to R with RUE. Pt may benefit from mirror therapy if he remains alert next session as he does not accept verbal or tactile cues to correct balance well. Pt intermittently follows commands and provides some initiation of bed mobility, however requires significant physical assistance for all mobility tasks currently. Pt will benefit from continued acute PT services in an effort to improve mobility quality and reduce caregiver burden.   ?Recommendations for follow up therapy are one component of a multi-disciplinary discharge planning process, led by the attending physician.  Recommendations may be updated based on patient status, additional functional criteria and insurance authorization. ? ?Follow Up Recommendations ? Skilled nursing-short term rehab (<3 hours/day) ?  ?  ?Assistance Recommended at Discharge Frequent or constant Supervision/Assistance  ?Patient can return home with the following Two people to help with walking and/or transfers;Two people to help with bathing/dressing/bathroom;Assist for transportation;Direct supervision/assist for financial management;Direct supervision/assist for medications management;Assistance with cooking/housework;Help with stairs or ramp for entrance ?  ?Equipment Recommendations ? Hospital bed;Other (comment) (hoyer lift)  ?  ?Recommendations for Other  Services   ? ? ?  ?Precautions / Restrictions Precautions ?Precautions: Fall ?Precaution Comments: flexiseal; cortrack, hand mittens bil, 2L [place] ?Restrictions ?Weight Bearing Restrictions: No  ?  ? ?Mobility ? Bed Mobility ?Overal bed mobility: Needs Assistance ?Bed Mobility: Rolling, Supine to Sit, Sit to Supine ?Rolling: Total assist. Pt rolls to both sides with totalA ?  ?Supine to sit: Total assist, HOB elevated, +2 for physical assistance ?Sit to supine: Total assist, HOB elevated, +2 for physical assistance ?  ?General bed mobility comments: pt does not follow commands well to initiate rolling or bed mobility. Pt often pushing against therapists. ?  ? ?Transfers ?Overall transfer level:  (deferred due to impaired sitting balance) ?  ?  ?  ?  ?  ?  ?  ?  ?  ?  ? ?Ambulation/Gait ?  ?  ?  ?  ?  ?  ?  ?  ? ? ?Stairs ?  ?  ?  ?  ?  ? ? ?Wheelchair Mobility ?  ? ?Modified Rankin (Stroke Patients Only) ?  ? ? ?  ?Balance Overall balance assessment: Needs assistance ?Sitting-balance support: Feet supported, No upper extremity supported, Single extremity supported ?Sitting balance-Leahy Scale: Zero ?Sitting balance - Comments: maxA with lean to R side, pt pushing to R side with LUE or pulling to R with R arm. Pt sits at edge of bed for 10+ minutes with significant assistance, working on maintaining balance and posture. ?Postural control: Right lateral lean ?  ?  ?  ?  ?  ?  ?  ?  ?  ?  ?  ?  ?  ?  ?  ? ?  ?Cognition Arousal/Alertness: Awake/alert (intermittent lethargy but becomes alert with stimulation) ?Behavior During Therapy: Agitated, Flat affect ?Overall Cognitive Status: Impaired/Different from baseline ?Area of Impairment: Orientation, Memory,  Attention, Following commands, Safety/judgement, Awareness, Problem solving ?  ?  ?  ?  ?  ?  ?  ?  ?Orientation Level: Disoriented to, Time, Situation ?Current Attention Level: Focused ?Memory: Decreased recall of precautions, Decreased short-term memory ?Following  Commands: Follows one step commands with increased time, Follows one step commands inconsistently ?Safety/Judgement: Decreased awareness of safety, Decreased awareness of deficits ?Awareness: Intellectual ?Problem Solving: Slow processing, Difficulty sequencing, Decreased initiation, Requires verbal cues, Requires tactile cues ?  ?  ?  ? ?  ?Exercises   ? ?  ?General Comments General comments (skin integrity, edema, etc.): pt on 2L [place], HR stable in 90s. pt with significant extensor tone and pushing to right side ?  ?  ? ?Pertinent Vitals/Pain Pain Assessment ?Pain Assessment: Faces ?Faces Pain Scale: Hurts even more ?Pain Location: generalized ?Pain Descriptors / Indicators: Grimacing ?Pain Intervention(s): Monitored during session  ? ? ?Home Living   ?  ?  ?  ?  ?  ?  ?  ?  ?  ?   ?  ?Prior Function    ?  ?  ?   ? ?PT Goals (current goals can now be found in the care plan section) Acute Rehab PT Goals ?Patient Stated Goal: Pt unable to state ?Progress towards PT goals: Not progressing toward goals - comment (remains limited by AMS and reduced awareness of deficits) ? ?  ?Frequency ? ? ? Min 2X/week ? ? ? ?  ?PT Plan Current plan remains appropriate  ? ? ?Co-evaluation PT/OT/SLP Co-Evaluation/Treatment: Yes ?Reason for Co-Treatment: Complexity of the patient's impairments (multi-system involvement);Necessary to address cognition/behavior during functional activity;For patient/therapist safety ?PT goals addressed during session: Mobility/safety with mobility;Balance;Strengthening/ROM ?  ?  ? ?  ?AM-PAC PT "6 Clicks" Mobility   ?Outcome Measure ? Help needed turning from your back to your side while in a flat bed without using bedrails?: Total ?Help needed moving from lying on your back to sitting on the side of a flat bed without using bedrails?: Total ?Help needed moving to and from a bed to a chair (including a wheelchair)?: Total ?Help needed standing up from a chair using your arms (e.g., wheelchair or bedside  chair)?: Total ?Help needed to walk in hospital room?: Total ?Help needed climbing 3-5 steps with a railing? : Total ?6 Click Score: 6 ? ?  ?End of Session Equipment Utilized During Treatment: Oxygen ?Activity Tolerance: Other (comment) (limited by impaired cognition) ?Patient left: in bed;with call bell/phone within reach;with bed alarm set ?Nurse Communication: Mobility status;Need for lift equipment ?PT Visit Diagnosis: Other abnormalities of gait and mobility (R26.89);Difficulty in walking, not elsewhere classified (R26.2);Muscle weakness (generalized) (M62.81) ?  ? ? ?Time: 4034-7425 ?PT Time Calculation (min) (ACUTE ONLY): 23 min ? ?Charges:  $Neuromuscular Re-education: 8-22 mins          ?          ? ?Arlyss Gandy, PT, DPT ?Acute Rehabilitation ?Pager: [phone] ?Office [phone] ? ? ? ?Arlyss Gandy ?04/08/2022, 2:34 PM ? ?

## 2022-04-08 NOTE — Progress Notes (Addendum)
Speech Language Pathology Treatment: Dysphagia  ?Patient Details ?Name: Frederick [name] ?MRN: [id number] ?DOB: 1961-06-19 ?Today's Date: 04/08/2022 ?Time: [id number] ?SLP Time Calculation (min) (ACUTE ONLY): 14 min ? ?Assessment / Plan / Recommendation ?Clinical Impression ? Pt with mild wet vocal quality prior to PO trials this date. He presented initially with adequate alertness and participated in thorough oral care where thick phlegm was removed from hard palate. Ice chips administered via teaspoon, with pt actively masticating and manipulating. Swallow suspected to be with slight delay and wet vocal quality increased without pt attempting cough response. Max verbal/tactile cues for volitional cough were followed by an elongated "uh" vocalization, not strong enough to clear vocal quality. By the 3rd ice chip, pt not orally accepting bolus and becoming increasingly lethargic, less responsive to cues. Pt orally suctioned and trials terminated. Delayed cough response noted as SLP was leaving the room. His poor alertness continues to impact ability to safely consume POs. A few ice chips after oral care when he is adequately alert remains appropriate at this time. Will continue f/u.  ?  ?HPI HPI: 61 y.o. male with history of seizure, alcohol abuse, pulmonary diverticulosis, hypertension who was admitted in February 2 months ago for status epilepticus in the setting of patient being noncompliant with his medication had to be intubated at the time and also had a stroke was found to have generalized tonic-clonic activity by patient's family and was brought to the ER on 03/20/22.  Pt previously placed on D3/thin liquid diet, but currently NPO d/t lethargy; has Cortrak; ST f/u for PO readiness. ?  ?   ?SLP Plan ? Continue with current plan of care ? ?  ?  ?Recommendations for follow up therapy are one component of a multi-disciplinary discharge planning process, led by the attending physician.  Recommendations may be updated  based on patient status, additional functional criteria and insurance authorization. ?  ? ?Recommendations  ?Diet recommendations: NPO ?Medication Administration: Via alternative means  ?   ?    ?   ? ? ? ? Oral Care Recommendations: Oral care QID ?Follow Up Recommendations: Skilled nursing-short term rehab (<3 hours/day) ?Assistance recommended at discharge: Intermittent Supervision/Assistance ?SLP Visit Diagnosis: Dysphagia, oropharyngeal phase (R13.12) ?Plan: Continue with current plan of care ? ? ? ? ?  ?  ? ? ? ?Ellwood Dense, MA, CCC-SLP ?Acute Rehabilitation Services ?Office Number: [phone] ? ?Frederick [name] ? ?04/08/2022, 1:01 PM ?

## 2022-04-09 LAB — GLUCOSE, CAPILLARY
Glucose-Capillary: 100 mg/dL — ABNORMAL HIGH (ref 70–99)
Glucose-Capillary: 107 mg/dL — ABNORMAL HIGH (ref 70–99)
Glucose-Capillary: 17 mg/dL — CL (ref 70–99)
Glucose-Capillary: 29 mg/dL — CL (ref 70–99)
Glucose-Capillary: 31 mg/dL — CL (ref 70–99)
Glucose-Capillary: 47 mg/dL — ABNORMAL LOW (ref 70–99)
Glucose-Capillary: 48 mg/dL — ABNORMAL LOW (ref 70–99)
Glucose-Capillary: 65 mg/dL — ABNORMAL LOW (ref 70–99)
Glucose-Capillary: 75 mg/dL (ref 70–99)
Glucose-Capillary: 95 mg/dL (ref 70–99)
Glucose-Capillary: <10 mg/dL — CL (ref 70–99)
Glucose-Capillary: <10 mg/dL — CL (ref 70–99)

## 2022-04-09 LAB — CBC WITH DIFFERENTIAL/PLATELET
Abs Immature Granulocytes: 0.04 10*3/uL (ref 0.00–0.07)
Basophils Absolute: 0.1 10*3/uL (ref 0.0–0.1)
Basophils Relative: 1 %
Eosinophils Absolute: 0.6 10*3/uL — ABNORMAL HIGH (ref 0.0–0.5)
Eosinophils Relative: 6 %
HCT: 37.2 % — ABNORMAL LOW (ref 39.0–52.0)
Hemoglobin: 12 g/dL — ABNORMAL LOW (ref 13.0–17.0)
Immature Granulocytes: 0 %
Lymphocytes Relative: 19 %
Lymphs Abs: 1.9 10*3/uL (ref 0.7–4.0)
MCH: 26.7 pg (ref 26.0–34.0)
MCHC: 32.3 g/dL (ref 30.0–36.0)
MCV: 82.9 fL (ref 80.0–100.0)
Monocytes Absolute: 1.1 10*3/uL — ABNORMAL HIGH (ref 0.1–1.0)
Monocytes Relative: 11 %
Neutro Abs: 6.2 10*3/uL (ref 1.7–7.7)
Neutrophils Relative %: 63 %
Platelets: 378 10*3/uL (ref 150–400)
RBC: 4.49 MIL/uL (ref 4.22–5.81)
RDW: 17.3 % — ABNORMAL HIGH (ref 11.5–15.5)
WBC: 9.8 10*3/uL (ref 4.0–10.5)
nRBC: 0 % (ref 0.0–0.2)

## 2022-04-09 LAB — BASIC METABOLIC PANEL
Anion gap: 8 (ref 5–15)
BUN: 34 mg/dL — ABNORMAL HIGH (ref 8–23)
CO2: 27 mmol/L (ref 22–32)
Calcium: 9.5 mg/dL (ref 8.9–10.3)
Chloride: 104 mmol/L (ref 98–111)
Creatinine, Ser: 0.8 mg/dL (ref 0.61–1.24)
GFR, Estimated: >60 mL/min (ref >60)
Glucose, Bld: 115 mg/dL — ABNORMAL HIGH (ref 70–99)
Potassium: 4.3 mmol/L (ref 3.5–5.1)
Sodium: 139 mmol/L (ref 135–145)

## 2022-04-09 LAB — GLUCOSE, RANDOM: Glucose, Bld: 128 mg/dL — ABNORMAL HIGH (ref 70–99)

## 2022-04-09 MED ORDER — DEXTROSE 50 % IV SOLN
INTRAVENOUS | Status: AC
  Administered 2022-04-09: 50 mL
  Filled 2022-04-09: qty 50

## 2022-04-09 MED ORDER — DEXTROSE 50 % IV SOLN
INTRAVENOUS | Status: AC
Start: 2022-04-09 — End: 2022-04-09
  Administered 2022-04-09: 50 mL
  Filled 2022-04-09: qty 50

## 2022-04-09 MED ORDER — DEXTROSE 50 % IV SOLN: 12.5000 g | INTRAVENOUS | Status: AC

## 2022-04-09 NOTE — Progress Notes (Signed)
Serum glucose 128.  ?

## 2022-04-09 NOTE — Progress Notes (Signed)
Hypoglycemic Event ? ?2024:CBG: 65 ? ?2112:Treatment: Dextrose 50 Solution ? ?Symptoms: None  ? ?Follow-up CBG: Time: 2143 CBG Result: 75 ? ?Possible Reasons for Event:  ?Unknown patient is  on continuous tube feed Jevity 1.5 at 62ml.  ? ? ? ? ? ?[place] Faster ? ? ?

## 2022-04-09 NOTE — Progress Notes (Signed)
? ? ? [facility] ?                                                                            ? ? ?Frederick [name], is a 61 y.o. male, DOB - Jan 24, 1961, [id number] ?Admit date - 03/19/2022    ?Outpatient Primary MD for the patient is [facility] ? ?LOS - 20  days ? ? ? ?Brief summary  ? ?61 y.o. male with history of seizure, alcohol abuse, pulmonary diverticulosis, hypertension who was admitted in February for status epilepticus in the setting of patient being noncompliant with his medication had to be intubated at the time and also had a stroke. He was found to have generalized tonic-clonic activity by patient's family and was brought to the ER.  Initially admitted to the floor.  However patient decompensated and had to be transferred to the ICU.  Had to be intubated again.  Neurology was consulted.  Patient was stabilized.  Subsequently extubated. ? ?Assessment & Plan  ? ? ?Assessment and Plan: ? ?History of seizures admitted with status epilepticus.   ?History of EtOH abuse and medical noncompliance ?Patient was intubated on admission and extubated.  Currently on 2 lit of [place] oxygen.  ?Neurology on board recommended to continue with Vimpat 150 mg BID, keppra 1000 mg BID,. ?MRI brain without contrast shows chronic microvascular disease generalized cerebral volume loss.  ? ? ? ? ?Acute metabolic encephalopathy thought to be secondary to status epilepticus.  Mentation has been very slow to improve still somnolent. ?Pt is alert, and oriented to person only , still confused.  ? ? ? ?Fever,  suspect persistent aspiration pneumonia ?Restarted him back on Unasyn  ?Blood cultures ordered and pending, negative so far.  ?Patient's urine analysis is negative for infection. ?Leukocytosis resolved.  ? ? ?Acute respiratory failure with hypoxia related to status epilepticus and aspiration pneumonia . ?Patient was extubated, though he completed the course of antibiotics patient continues to have fevers, last fever  on 04/07/22,.  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 ?Debris plugging the distal bronchus intermedius as well as right middle and right lower lobe bronchi.   ?Restart him back on the Unasyn after discussing with Dr. Linus Salmons from infectious disease. ? ? ?History of EtOH and tobacco abuse ?Patient currently is on nicotine patch. ? ? ? ?Oropharyngeal dysphagia currently has cortrack for nutrition.   ?Patient is currently not safe for oral intake. ?SLP on board and continues to follow. SLP eval on 04/08/22 recommending NPO.  ? ? ?Hypertension ?Blood pressure parameters are optimal  ? ? ?Mild hyponatremia/hypomagnesemia ?Sodium of 134.  ?Magnesium level wnl.  ? ? ? ?Anemia of chronic disease ?Normocytic ?Continue to monitor. ? ? ?Loose stools probably secondary to tube feedings ?On prn Imodium. ? ? ? ?History of DVT/PE ?Completed course of anticoagulation. ? ? ? ?Moderate protein calorie malnutrition patient currently on tube feeds. ? ?Hypoglycemia:  ?Check RANDOM glucose levels.  ? ? ?Malnutrition Type: ? ?Nutrition Problem: Moderate Malnutrition ?Etiology: social / environmental circumstances (alcohol abuse) ? ? ?Malnutrition Characteristics: ? ?Signs/Symptoms: mild fat depletion, mild  muscle depletion, moderate muscle depletion ? ? ?Nutrition Interventions: ? ?Interventions: Tube feeding, Prostat ? ?Estimated body mass index is 21.81 kg/m? as calculated from the following: ?  Height as of this encounter: '6\' 1"'  (1.854 m). ?  Weight as of this encounter: 75 kg. ? ?Code Status: Full code ?DVT Prophylaxis:  enoxaparin (LOVENOX) injection 40 mg Start: 03/20/22 1000 ? ? ?Level of Care: Level of care: Progressive ?Family Communication: None at bedside ? ?Disposition Plan:     Remains inpatient appropriate: encephalopathic and on tube feeds.  ? ?Procedures:  ?LTM  EEG ?MRI brain without contrast.  ?CT chest without contrast.  ? ?Consultants:   ?Neurology ? ?Antimicrobials:  ? ?Anti-infectives (From admission, onward)  ? ? Start     Dose/Rate Route Frequency Ordered Stop  ? 04/07/22 1730  Ampicillin-Sulbactam (UNASYN) 3 g in sodium chloride 0.9 % 100 mL IVPB       ? 3 g ?200 mL/hr over 30 Minutes Intravenous Every 6 hours 04/07/22 1638    ? 03/24/22 1000  amoxicillin (AMOXIL) capsule 500 mg       ? 500 mg Per Tube Every 8 hours 03/24/22 0851 03/27/22 2121  ? 03/22/22 1000  Ampicillin-Sulbactam (UNASYN) 3 g in sodium chloride 0.9 % 100 mL IVPB  Status:  Discontinued       ? 3 g ?200 mL/hr over 30 Minutes Intravenous Every 6 hours 03/22/22 0842 03/24/22 0851  ? 03/20/22 1700  cefTRIAXone (ROCEPHIN) 2 g in sodium chloride 0.9 % 100 mL IVPB  Status:  Discontinued       ? 2 g ?200 mL/hr over 30 Minutes Intravenous Every 24 hours 03/20/22 1611 03/22/22 0842  ? ?  ? ? ? ?Medications ? ?Scheduled Meds: ? aspirin  300 mg Rectal Daily  ? Or  ? aspirin  325 mg Per Tube Daily  ? carvedilol  12.5 mg Per Tube BID WC  ? chlorhexidine gluconate (MEDLINE KIT)  15 mL Mouth Rinse BID  ? Chlorhexidine Gluconate Cloth  6 each Topical Daily  ? cilostazol  100 mg Per Tube BID  ? enoxaparin (LOVENOX) injection  40 mg Subcutaneous Q24H  ? feeding supplement (PROSource TF)  45 mL Per Tube BID  ? folic acid  1 mg Per Tube Daily  ? lacosamide  150 mg Per Tube BID  ? levETIRAcetam  1,000 mg Per Tube BID  ? multivitamin with minerals  1 tablet Per Tube Daily  ? nicotine  14 mg Transdermal Daily  ? QUEtiapine  12.5 mg Per Tube QHS  ? thiamine  100 mg Per Tube Daily  ? ?Continuous Infusions: ? sodium chloride Stopped (04/03/22 1035)  ? ampicillin-sulbactam (UNASYN) IV 3 g (04/09/22 1217)  ? feeding supplement (JEVITY 1.5 CAL/FIBER) 1,000 mL (04/09/22 1204)  ? ?PRN Meds:.acetaminophen **OR** acetaminophen (TYLENOL) oral liquid 160 mg/5 mL **OR** acetaminophen, docusate, haloperidol lactate, hydrALAZINE,  loperamide HCl, polyethylene glycol ? ? ? ?Subjective:  ? ?Frederick [name] was seen and examined today. Confused, but alert.  ? ?Objective:  ? ?Vitals:  ? 04/08/22 2351 04/09/22 0402 04/09/22 0453 04/09/22 0800  ?BP: 122/71 128/85    ?Pulse: 88 89  80  ?Resp: (!) '23 19  18  ' ?Temp: 98.2 ?F (36.8 ?C) 98.4 ?F (36.9 ?C)    ?TempSrc: Axillary Axillary    ?SpO2: 94% 100%  100%  ?Weight:   75 kg   ?Height:      ? ? ?Intake/Output Summary (Last 24 hours) at 04/09/2022 1226 ?Last data  filed at 04/09/2022 8299 ?Gross per 24 hour  ?Intake 30 ml  ?Output 1150 ml  ?Net -1120 ml  ? ? ?Filed Weights  ? 04/07/22 0500 04/08/22 0232 04/09/22 0453  ?Weight: 79.1 kg 74.2 kg 75 kg  ? ? ? ?Exam ?General exam: Appears calm and comfortable  ?Respiratory system: Clear to auscultation. Respiratory effort normal. ?Cardiovascular system: S1 & S2 heard, RRR.  No pedal edema. ?Gastrointestinal system: Abdomen is nondistended, soft and nontender. Normal bowel sounds heard. ?Central nervous system: Alert but confused.  ?Extremities: Symmetric 5 x 5 power. ?Skin: No rashes, lesions or ulcers ?Psychiatry: no agitation.  ? ? ? ? ? ?Data Reviewed:  I have personally reviewed following labs and imaging studies ? ? ?CBC ?Lab Results  ?Component Value Date  ? WBC 9.8 04/09/2022  ? RBC 4.49 04/09/2022  ? HGB 12.0 (L) 04/09/2022  ? HCT 37.2 (L) 04/09/2022  ? MCV 82.9 04/09/2022  ? MCH 26.7 04/09/2022  ? PLT 378 04/09/2022  ? MCHC 32.3 04/09/2022  ? RDW 17.3 (H) 04/09/2022  ? LYMPHSABS 1.9 04/09/2022  ? MONOABS 1.1 (H) 04/09/2022  ? EOSABS 0.6 (H) 04/09/2022  ? BASOSABS 0.1 04/09/2022  ? ? ? ?Last metabolic panel ?Lab Results  ?Component Value Date  ? NA 139 04/09/2022  ? K 4.3 04/09/2022  ? CL 104 04/09/2022  ? CO2 27 04/09/2022  ? BUN 34 (H) 04/09/2022  ? CREATININE 0.80 04/09/2022  ? GLUCOSE 115 (H) 04/09/2022  ? GFRNONAA >60 04/09/2022  ? GFRAA >60 04/27/2020  ? CALCIUM 9.5 04/09/2022  ? PHOS 4.5 03/24/2022  ? PROT 7.5 04/02/2022  ? ALBUMIN 2.8 (L) 04/02/2022   ? BILITOT 0.3 04/02/2022  ? ALKPHOS 107 04/02/2022  ? AST 27 04/02/2022  ? ALT 28 04/02/2022  ? ANIONGAP 8 04/09/2022  ? ? ?CBG (last 3)  ?Recent Labs  ?  04/09/22 ?0740 04/09/22 ?1148 04/09/22 ?1210  ?GLUCAP

## 2022-04-09 NOTE — TOC Progression Note (Signed)
Transition of Care (TOC) - Progression Note  ? ? ?Patient Details  ?Name: Frederick [name] ?MRN: [id number] ?Date of Birth: 1961/05/21 ? ?Transition of Care (TOC) CM/SW Contact  ?Angelita Ingles, RN ?Phone Number:[phone] ? ?04/09/2022, 2:33 PM ? ?Clinical Narrative:    ?TOC continues to follow for discharge appropriateness. ? ? ?Expected Discharge Plan: [place] ?Barriers to Discharge: Continued Medical Work up, SNF Pending bed offer, Insurance Authorization ? ?Expected Discharge Plan and Services ?Expected Discharge Plan: [place] ?  ?  ?  ?Living arrangements for the past 2 months: Apartment ?                ?  ?  ?  ?  ?  ?  ?  ?  ?  ?  ? ? ?Social Determinants of Health (SDOH) Interventions ?  ? ?Readmission Risk Interventions ? ?  01/12/2022  ?  3:25 PM  ?Readmission Risk Prevention Plan  ?Transportation Screening Complete  ?Medication Review Press photographer) Referral to Pharmacy  ?PCP or Specialist appointment within 3-5 days of discharge Not Complete  ?PCP/Specialist Appt Not Complete comments Patient not ready for d/c  ?[place] or [place] Not Complete  ?SW Recovery Care/Counseling Consult Complete  ?Palliative Care Screening Not Applicable  ?Skilled Nursing Facility Complete  ? ? ?

## 2022-04-09 NOTE — Progress Notes (Signed)
1148- CBG 29, pt lying in bed alert continues to be oriented x 1. Pt rambling. Skin cool to touch. VSS. Administered 25 ml of dextrose 50% IV.  ? ?1204- CBG 17 continues to be alert and rambling. Administered 2nd dose of dextrose 50% 25 ml. MD notified. See new orders.  ? ?1237- CBG reading less than 10. Pt cont to be alert and oriented x 1. VSS. Will await glucose serum results.  ?

## 2022-04-10 LAB — GLUCOSE, CAPILLARY
Glucose-Capillary: 97 mg/dL (ref 70–99)
Glucose-Capillary: 21 mg/dL — CL (ref 70–99)
Glucose-Capillary: 24 mg/dL — CL (ref 70–99)
Glucose-Capillary: <10 mg/dL — CL (ref 70–99)
Glucose-Capillary: <10 mg/dL — CL (ref 70–99)
Glucose-Capillary: 23 mg/dL — CL (ref 70–99)

## 2022-04-10 LAB — GLUCOSE, RANDOM: Glucose, Bld: 124 mg/dL — ABNORMAL HIGH (ref 70–99)

## 2022-04-10 MED ORDER — QUETIAPINE 12.5 MG HALF TABLET
12.5000 mg | ORAL_TABLET | Freq: Every evening | ORAL | Status: DC | PRN
  Administered 2022-04-10: 12.5 mg
  Filled 2022-04-10 (×2): qty 1

## 2022-04-10 NOTE — Progress Notes (Signed)
CBG results continue to be low possible inaccurate readings r/t peripheral artery disease. Pt continues to be asymptomatic. MD notified. See new orders.  ?

## 2022-04-10 NOTE — Progress Notes (Signed)
Speech Language Pathology Treatment: Dysphagia  ?Patient Details ?Name: Frederick [name] ?MRN: [id number] ?DOB: 1961/01/05 ?Today's Date: 04/10/2022 ?Time: 1115-1140 ?SLP Time Calculation (min) (ACUTE ONLY): 25 min ? ?Assessment / Plan / Recommendation ?Clinical Impression ? Patient seen by SLP for skilled treatment session focused on dysphagia goals.Patient with eyes closed but he is alert and responds, oriented to year, place and approximate month (says April but also "Im always a month off"). His voice is low in intensity, speech is slow and dysarthric but SLP able to understand majority of his verbalizations. After oral care, SLP assessed patient's toleration of straw sips of thin liquids (water) and spoon bites of puree (applesauce). He was able to form lips around straw and suck, exhibiting suspected delayed initiation of swallow. With puree solids, he did not exhibit adequate oral preparatory phase and kept mouth open with tongue sticking out slightly. SLP placed small amount of applesauce in anterior portion of mouth and patient then closed mouth and started to manipulate orally. Again, he exhibited a delayed swallow initiation. Patient then started coughing and did expectorate (through cued "spit it out" as well as oral suction) thick whitish-clear secretions mixed with some of the applesauce. Cough was congested sounding but voice then clear afterwards. Oxygen levels were observed to decrease to low 80's then rather quickly rise back up and remain stable at 95-97%. While SLP outside of room, patient heard with congested coughing and SLP returned to orally suction some clearish secretions. Patient is still too lethargic and not able to manage his secretions to safely consume any PO's. SLP will continue to follow for PO trials/readiness. ?  ?HPI HPI: 61 y.o. male with history of seizure, alcohol abuse, pulmonary diverticulosis, hypertension who was admitted in February 2 months ago for status epilepticus in the  setting of patient being noncompliant with his medication had to be intubated at the time and also had a stroke was found to have generalized tonic-clonic activity by patient's family and was brought to the ER on 03/20/22.  Pt previously placed on D3/thin liquid diet, but currently NPO d/t lethargy; has Cortrak; ST f/u for PO readiness. ?  ?   ?SLP Plan ? Continue with current plan of care ? ?  ?  ?Recommendations for follow up therapy are one component of a multi-disciplinary discharge planning process, led by the attending physician.  Recommendations may be updated based on patient status, additional functional criteria and insurance authorization. ?  ? ?Recommendations  ?Diet recommendations: NPO ?Medication Administration: Via alternative means  ?   ?    ?   ? ? ? ? Oral Care Recommendations: Oral care QID;Staff/trained caregiver to provide oral care ?Follow Up Recommendations: Skilled nursing-short term rehab (<3 hours/day) ?Assistance recommended at discharge: Intermittent Supervision/Assistance ?SLP Visit Diagnosis: Dysphagia, oropharyngeal phase (R13.12) ?Plan: Continue with current plan of care ? ? ? ? ?  ?  ? ? ?Angela Nevin, MA, CCC-SLP ?Speech Therapy ? ?

## 2022-04-10 NOTE — Progress Notes (Signed)
? ? ? [facility] ?                                                                            ? ? ?Frederick [name], is a 61 y.o. male, DOB - Nov 01, 1961, [id number] ?Admit date - 03/19/2022    ?Outpatient Primary MD for the patient is [facility] ? ?LOS - 21  days ? ? ? ?Brief summary  ? ?61 y.o. male with history of seizure, alcohol abuse, pulmonary diverticulosis, hypertension who was admitted in February for status epilepticus in the setting of patient being noncompliant with his medication had to be intubated at the time and also had a stroke. He was found to have generalized tonic-clonic activity by patient's family and was brought to the ER.  Initially admitted to the floor.  However patient decompensated and had to be transferred to the ICU.  Had to be intubated again.  Neurology was consulted.  Patient was stabilized.  Subsequently extubated. He remains on 2 lit of [place] oxygen. Due to his continual encephalopathic state, Cortrak was placed for nutrition. He is more alert but remains confused.  ? ?Assessment & Plan  ? ? ?Assessment and Plan: ? ?History of seizures admitted with status epilepticus.   ?History of EtOH abuse and medical noncompliance ?Patient was intubated on admission and extubated.  Currently on 2 lit of [place] oxygen.  ?Neurology on board recommended to continue with Vimpat 150 mg BID, keppra 1000 mg BID,. ?MRI brain without contrast shows chronic microvascular disease generalized cerebral volume loss.  ?LTM EEG does not show any seizures, it was discontinued.  ? ? ? ?Acute metabolic encephalopathy thought to be secondary to status epilepticus.  Mentation has been very slow to improve still somnolent at times. He is able to answer simple questions.  ?Slp worked and recommended npo, as he couldn't clear secretions.  ?Changed  Qhs seroquel to prn at night. ? ? ? ?Fever,  suspect persistent aspiration pneumonia ?Restarted him back on Unasyn , d./c after 5 days.  ?Blood cultures ordered  and pending, negative so far.  ?Patient's urine analysis is negative for infection. ?Leukocytosis resolved.  ? ? ?Acute respiratory failure with hypoxia related to status epilepticus and aspiration pneumonia . ?Patient was extubated, though he completed the course of antibiotics  ?patient continued to have low grade fevers, last fever on 04/07/22,.  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 Suspect all chronic changes.  ?Debris plugging the distal bronchus intermedius as well as right middle and right lower lobe bronchi.   ?Restarted  him back on the Unasyn after discussing with Dr. Linus Salmons from infectious disease. ? ? ?History of EtOH and tobacco abuse ?Patient currently is on nicotine patch. ? ? ? ?Oropharyngeal dysphagia currently has cortrack for nutrition.   ?Patient is currently not safe for oral intake, as he is still encephalopathic and unable to clear secretions.  ?SLP on board and continues to follow. SLP eval on 04/08/22 recommending NPO.  ? ? ?Hypertension ?Blood pressure parameters are optimal  ? ? ?Mild hyponatremia/hypomagnesemia ?Sodium of 134.  ?Magnesium level wnl.  ? ? ? ?  Anemia of chronic disease ?Normocytic ?Continue to monitor. ? ? ?Loose stools probably secondary to tube feedings ?On prn Imodium. ?Stool is more solidified and rectal tube is out.  ? ? ? ?History of DVT/PE ?Completed course of anticoagulation. ? ? ? ?Moderate protein calorie malnutrition patient currently on tube feeds. ? ? ? ?Malnutrition Type: ? ?Nutrition Problem: Moderate Malnutrition ?Etiology: social / environmental circumstances (alcohol abuse) ? ? ?Malnutrition Characteristics: ? ?Signs/Symptoms: mild fat depletion, mild muscle depletion, moderate muscle depletion ? ? ?Nutrition Interventions: ? ?Interventions: Tube feeding, Prostat ? ?Estimated body mass index is  21.55 kg/m? as calculated from the following: ?  Height as of this encounter: '6\' 1"'  (1.854 m). ?  Weight as of this encounter: 74.1 kg. ? ?Code Status: Full code ?DVT Prophylaxis:  enoxaparin (LOVENOX) injection 40 mg Start: 03/20/22 1000 ? ? ?Level of Care: Level of care: Progressive ?Family Communication: None at bedside ? ?Disposition Plan:     Remains inpatient appropriate: encephalopathic and on tube feeds.  ? ?Procedures:  ?LTM EEG ?MRI brain without contrast.  ?CT chest without contrast.  ? ?Consultants:   ?Neurology ? ?Antimicrobials:  ? ?Anti-infectives (From admission, onward)  ? ? Start     Dose/Rate Route Frequency Ordered Stop  ? 04/07/22 1730  Ampicillin-Sulbactam (UNASYN) 3 g in sodium chloride 0.9 % 100 mL IVPB       ? 3 g ?200 mL/hr over 30 Minutes Intravenous Every 6 hours 04/07/22 1638    ? 03/24/22 1000  amoxicillin (AMOXIL) capsule 500 mg       ? 500 mg Per Tube Every 8 hours 03/24/22 0851 03/27/22 2121  ? 03/22/22 1000  Ampicillin-Sulbactam (UNASYN) 3 g in sodium chloride 0.9 % 100 mL IVPB  Status:  Discontinued       ? 3 g ?200 mL/hr over 30 Minutes Intravenous Every 6 hours 03/22/22 0842 03/24/22 0851  ? 03/20/22 1700  cefTRIAXone (ROCEPHIN) 2 g in sodium chloride 0.9 % 100 mL IVPB  Status:  Discontinued       ? 2 g ?200 mL/hr over 30 Minutes Intravenous Every 24 hours 03/20/22 1611 03/22/22 0842  ? ?  ? ? ? ?Medications ? ?Scheduled Meds: ? aspirin  300 mg Rectal Daily  ? Or  ? aspirin  325 mg Per Tube Daily  ? carvedilol  12.5 mg Per Tube BID WC  ? chlorhexidine gluconate (MEDLINE KIT)  15 mL Mouth Rinse BID  ? cilostazol  100 mg Per Tube BID  ? enoxaparin (LOVENOX) injection  40 mg Subcutaneous Q24H  ? feeding supplement (PROSource TF)  45 mL Per Tube BID  ? folic acid  1 mg Per Tube Daily  ? lacosamide  150 mg Per Tube BID  ? levETIRAcetam  1,000 mg Per Tube BID  ? multivitamin with minerals  1 tablet Per Tube Daily  ? nicotine  14 mg Transdermal Daily  ? thiamine  100 mg Per Tube Daily   ? ?Continuous Infusions: ? sodium chloride Stopped (04/03/22 1035)  ? ampicillin-sulbactam (UNASYN) IV 3 g (04/10/22 1456)  ? feeding supplement (JEVITY 1.5 CAL/FIBER) 1,000 mL (04/10/22 0451)  ? ?PRN Meds:.acetaminophen **OR** acetaminophen (TYLENOL) oral liquid 160 mg/5 mL **OR** acetaminophen, docusate, haloperidol lactate, hydrALAZINE, loperamide HCl, polyethylene glycol, QUEtiapine ? ? ? ?Subjective:  ? ?Frederick [name] was seen and examined today. , confused, not agitated.  ? ?Objective:  ? ?Vitals:  ? 04/10/22 0452 04/10/22 0730 04/10/22 1025 04/10/22 1500  ?BP:  (!) 143/79 133/72 140/76  ?  Pulse:  82 84 75  ?Resp:  '17 14 15  ' ?Temp:  98.4 ?F (36.9 ?C) 98.2 ?F (36.8 ?C) 98.2 ?F (36.8 ?C)  ?TempSrc:  Axillary Axillary Axillary  ?SpO2:  98% 98% 98%  ?Weight: 74.1 kg     ?Height:      ? ? ?Intake/Output Summary (Last 24 hours) at 04/10/2022 1548 ?Last data filed at 04/10/2022 1300 ?Gross per 24 hour  ?Intake --  ?Output 1150 ml  ?Net -1150 ml  ? ? ?Filed Weights  ? 04/08/22 0232 04/09/22 0453 04/10/22 0452  ?Weight: 74.2 kg 75 kg 74.1 kg  ? ? ? ?Exam ?General exam: ill appearing gentleman, on [place] oxygen and Cortrak.  ?Respiratory system: congested in the upper chest , due to unable to control secretions air entry fair.  ?Cardiovascular system: S1 & S2 heard, RRR. No JVD, No pedal edema. ?Gastrointestinal system: Abdomen is nondistended, soft and nontender. . Normal bowel sounds heard. ?Central nervous system: Alert and oriented. No focal neurological deficits. ?Extremities: No pedal edema.  ?Skin: No rashes, ?Psychiatry: Mood & affect appropriate.  ? ? ? ? ? ? ?Data Reviewed:  I have personally reviewed following labs and imaging studies ? ? ?CBC ?Lab Results  ?Component Value Date  ? WBC 9.8 04/09/2022  ? RBC 4.49 04/09/2022  ? HGB 12.0 (L) 04/09/2022  ? HCT 37.2 (L) 04/09/2022  ? MCV 82.9 04/09/2022  ? MCH 26.7 04/09/2022  ? PLT 378 04/09/2022  ? MCHC 32.3 04/09/2022  ? RDW 17.3 (H) 04/09/2022  ? LYMPHSABS 1.9  04/09/2022  ? MONOABS 1.1 (H) 04/09/2022  ? EOSABS 0.6 (H) 04/09/2022  ? BASOSABS 0.1 04/09/2022  ? ? ? ?Last metabolic panel ?Lab Results  ?Component Value Date  ? NA 139 04/09/2022  ? K 4.3 04/09/2022  ? CL 10

## 2022-04-10 NOTE — Progress Notes (Signed)
Hypoglycemic Event ? ?CBG: 29 ? ?Symptoms: None  ? ?Possible Reasons for Event: Unknown pt is on continuous tube feed. Appears this is a reoccurring event. MD notified. Will obtain STAT serum glucose. See new order.  ? ?Frederick [name] ? ? ?

## 2022-04-10 NOTE — Progress Notes (Signed)
Serum glucose level 124.  ?

## 2022-04-11 DIAGNOSIS — Z7189 Other specified counseling: Secondary | ICD-10-CM

## 2022-04-11 DIAGNOSIS — Z515 Encounter for palliative care: Secondary | ICD-10-CM

## 2022-04-11 MED ORDER — GLYCOPYRROLATE 1 MG PO TABS
1.0000 mg | ORAL_TABLET | ORAL | Status: DC | PRN
  Filled 2022-04-11: qty 1

## 2022-04-11 MED ORDER — NICOTINE 21 MG/24HR TD PT24
21.0000 mg | MEDICATED_PATCH | Freq: Every day | TRANSDERMAL | Status: DC
  Administered 2022-04-11 – 2022-04-12 (×2): 21 mg via TRANSDERMAL
  Filled 2022-04-11 (×2): qty 1

## 2022-04-11 MED ORDER — GLYCOPYRROLATE 0.2 MG/ML IJ SOLN: 0.2000 mg | INTRAMUSCULAR | Status: DC | PRN

## 2022-04-11 MED ORDER — BISACODYL 10 MG RE SUPP
10.0000 mg | Freq: Every day | RECTAL | Status: DC | PRN
Start: 2022-04-11 — End: 2022-04-12

## 2022-04-11 MED ORDER — LORAZEPAM 2 MG/ML IJ SOLN
0.5000 mg | INTRAMUSCULAR | Status: DC | PRN
  Filled 2022-04-11: qty 1

## 2022-04-11 MED ORDER — MORPHINE SULFATE (PF) 2 MG/ML IV SOLN
2.0000 mg | Freq: Four times a day (QID) | INTRAVENOUS | Status: DC
  Administered 2022-04-11 – 2022-04-12 (×5): 2 mg via INTRAVENOUS
  Filled 2022-04-11 (×5): qty 1

## 2022-04-11 MED ORDER — ACETAMINOPHEN 650 MG RE SUPP: 650.0000 mg | Freq: Four times a day (QID) | RECTAL | Status: DC | PRN

## 2022-04-11 MED ORDER — ACETAMINOPHEN 325 MG PO TABS: 650.0000 mg | ORAL_TABLET | Freq: Four times a day (QID) | ORAL | Status: DC | PRN

## 2022-04-11 MED ORDER — ONDANSETRON HCL 4 MG/2ML IJ SOLN: 4.0000 mg | Freq: Four times a day (QID) | INTRAMUSCULAR | Status: DC | PRN

## 2022-04-11 MED ORDER — ONDANSETRON 4 MG PO TBDP: 4.0000 mg | ORAL_TABLET | Freq: Four times a day (QID) | ORAL | Status: DC | PRN

## 2022-04-11 MED ORDER — MORPHINE SULFATE (PF) 2 MG/ML IV SOLN: 2.0000 mg | INTRAVENOUS | Status: DC | PRN

## 2022-04-11 MED ORDER — BIOTENE DRY MOUTH MT LIQD: 15.0000 mL | OROMUCOSAL | Status: DC | PRN

## 2022-04-11 MED ORDER — LORAZEPAM 2 MG/ML IJ SOLN
1.0000 mg | Freq: Four times a day (QID) | INTRAMUSCULAR | Status: DC
  Administered 2022-04-11 – 2022-04-12 (×5): 1 mg via INTRAVENOUS
  Filled 2022-04-11 (×5): qty 1

## 2022-04-11 MED ORDER — POLYVINYL ALCOHOL 1.4 % OP SOLN
1.0000 [drp] | Freq: Four times a day (QID) | OPHTHALMIC | Status: DC | PRN
  Filled 2022-04-11: qty 15

## 2022-04-11 MED ORDER — LOPERAMIDE HCL 2 MG PO CAPS: 4.0000 mg | ORAL_CAPSULE | ORAL | Status: DC | PRN

## 2022-04-11 NOTE — Evaluation (Signed)
Clinical/Bedside Swallow Evaluation ?Patient Details  ?Name: Frederick [name] ?MRN: [id number] ?Date of Birth: 04-30-1961 ? ?Today's Date: 04/11/2022 ?Time: SLP Start Time (ACUTE ONLY): [id number] SLP Stop Time (ACUTE ONLY): [id number] ?SLP Time Calculation (min) (ACUTE ONLY): 20 min ? ?Past Medical History:  ?Past Medical History:  ?Diagnosis Date  ? Arthritis   ? Asthma   ? Bilateral lower extremity edema 05/28/2021  ? propping feet up  ? Cough 05/28/2021  ? with clear sputum  ? Dyspnea   ? Dyspnea 05/28/2021  ? with exertion  ? EtOH dependence ([place])   ? drinks 2-3 of 40ounes a day  ? Fibula fracture   ? Dr Mardelle Matte 02-2011  ? Finger osteomyelitis, right ([place]) 05/26/2021  ? right index finger  ? Hernia   ? surgery 01-17-13  ? Hypertension   ? not taking bp meds since may 2022  ? Pulmonary embolism ([place])   ? Seizures ([place])   ? Streptococcal bacteremia 09/09/2021  ? Tuberculosis   ? couple of yrs ago took tx at [facility] per pt on 05-28-2021  ? ?Past Surgical History:  ?Past Surgical History:  ?Procedure Laterality Date  ? AMPUTATION Right 05/29/2021  ? Procedure: PARTIAL RIGHT 2ND FINGER AMPUTATION;  Surgeon: Mcarthur Rossetti, MD;  Location[facility];  Service: Orthopedics;  Laterality: Right;  ? ANKLE SURGERY    ? INGUINAL HERNIA REPAIR Right 01/19/2013  ? Procedure: HERNIA REPAIR INGUINAL ADULT;  Surgeon: Gayland Curry, MD,FACS;  Location: [place];  Service: General;  Laterality: Right;  ? TEE WITHOUT CARDIOVERSION N/A 08/14/2021  ? Procedure: TRANSESOPHAGEAL ECHOCARDIOGRAM (TEE);  Surgeon: Josue Hector, MD;  Location[facility];  Service: Cardiovascular;  Laterality: N/A;  ? TIBIA IM NAIL INSERTION Left 01/22/2013  ? Procedure: INTRAMEDULLARY (IM) NAIL TIBIAL;  Surgeon: Johnny Bridge, MD;  Location: [place];  Service: Orthopedics;  Laterality: Left;  ? ?HPI:  ?61 y.o. male with history of seizure, alcohol abuse, pulmonary diverticulosis, hypertension who was admitted in February 2 months  ago for status epilepticus in the setting of patient being noncompliant with his medication had to be intubated at the time and also had a stroke was found to have generalized tonic-clonic activity by patient's family and was brought to the ER on 03/20/22.  Pt previously placed on D3/thin liquid diet, but currently NPO d/t lethargy. Cortrak d/c'd on 5/7 after family made decision for patient to be comfort care and patient started on Dys 3 solids, thin liquids diet.  ?  ?Assessment / Plan / Recommendation  ?Clinical Impression ? Palliative NP secure messaged SLP to inform of plan to transition patient to full comfort measures today which included discharge of Cortrak feeding tube and starting on PO's (resuming diet from most recent swallow evaluation of Dys 3 solids, thin liquids). Prior SLP eval and tx orders were discontinued and new order written for bedside/clinical swallow evaluation. When SLP entered room, patient was awake and alert, talking but voice continues to be low in intensity and patient is confused, some paranoia (asking SLP "are you bringing me poison?" and is not showing any awareness or orientation to time/place/situation. SLP provided oral care which included cues to cough and attempt to expectorate phlegm which SLP was then able to suction from back of oral cavity. Patient then willing to drink thin liquids (apple juice) via straw sips and although suspect mildly delayed swallow initiation and observed an instance of cough after sips, patient overall appears to be  tolerating thin liquids fairly well. In addition, cough response was productive and may not have been due to penetration or aspiration or liquids he was drinking. Patient declined any solids at this time but per palliative NP and RN he ate a full meal at lunch today. SLP is not recommending any further skilled SLP treatment or evaluation. Dys 3 solids, thin liquids appears to be the least restrictive oral diet for this patient. ?SLP  Visit Diagnosis: Dysphagia, oropharyngeal phase (R13.12) ?   ?Aspiration Risk ? Mild aspiration risk;Moderate aspiration risk  ?  ?Diet Recommendation Dysphagia 3 (Mech soft);Thin liquid  ? ?Liquid Administration via: Straw;Cup ?Medication Administration: Crushed with puree ?Supervision: Full supervision/cueing for compensatory strategies;Staff to assist with self feeding ?Compensations: Slow rate;Small sips/bites;Minimize environmental distractions ?Postural Changes: Seated upright at 90 degrees  ?  ?Other  Recommendations Oral Care Recommendations: Oral care BID;Staff/trained caregiver to provide oral care ?Other Recommendations: Have oral suction available   ? ?Recommendations for follow up therapy are one component of a multi-disciplinary discharge planning process, led by the attending physician.  Recommendations may be updated based on patient status, additional functional criteria and insurance authorization. ? ?Follow up Recommendations No SLP follow up  ? ? ?  ?Assistance Recommended at Discharge Frequent or constant Supervision/Assistance  ?Functional Status Assessment Patient has had a recent decline in their functional status and demonstrates the ability to make significant improvements in function in a reasonable and predictable amount of time.  ?Frequency and Duration   N/A ?  ?  ?   ? ?Prognosis   N/A ? ?  ? ?Swallow Study   ?General Date of Onset: 03/19/22 ?HPI: 61 y.o. male with history of seizure, alcohol abuse, pulmonary diverticulosis, hypertension who was admitted in February 2 months ago for status epilepticus in the setting of patient being noncompliant with his medication had to be intubated at the time and also had a stroke was found to have generalized tonic-clonic activity by patient's family and was brought to the ER on 03/20/22.  Pt previously placed on D3/thin liquid diet, but currently NPO d/t lethargy. Cortrak d/c'd on 5/7 after family made decision for patient to be comfort care and  patient started on Dys 3 solids, thin liquids diet. ?Type of Study: Bedside Swallow Evaluation ?Previous Swallow Assessment: BSE 03/30/22, 01/19/22 MBS  Rx D3/thin ?Diet Prior to this Study: Dysphagia 3 (soft);Thin liquids ?Temperature Spikes Noted: No ?Respiratory Status: Room air ?History of Recent Intubation: Yes ?Date extubated: 03/29/22 ?Behavior/Cognition: Alert;Cooperative;Confused ?Oral Cavity Assessment: Within Functional Limits ?Oral Care Completed by SLP: Yes ?Oral Cavity - Dentition: Adequate natural dentition;Missing dentition ?Self-Feeding Abilities: Able to feed self;Needs set up ?Patient Positioning: Upright in bed ?Baseline Vocal Quality: Low vocal intensity ?Volitional Cough: Congested;Strong ?Volitional Swallow: Unable to elicit  ?  ?Oral/Motor/Sensory Function Overall Oral Motor/Sensory Function: Within functional limits   ?Ice Chips     ?Thin Liquid Thin Liquid: Impaired ?Presentation: Straw;Self Fed ?Pharyngeal  Phase Impairments: Cough - Delayed;Suspected delayed Swallow ?Other Comments: one instance of delayed cough but this was productive as well  ?  ?Nectar Thick     ?Honey Thick     ?Puree Puree: Not tested ?Other Comments: patient declined   ?Solid ? ? ?  Solid: Not tested ?Other Comments: patient declined  ? ?  ?Sonia Baller, MA, CCC-SLP ?Speech Therapy ? ? ? ? ?

## 2022-04-11 NOTE — Progress Notes (Signed)
? ? ? [facility] ?                                                                            ? ? ?Frederick [name], is a 61 y.o. male, DOB - 03/05/1961, [id number] ?Admit date - 03/19/2022    ?Outpatient Primary MD for the patient is [facility] ? ?LOS - 22  days ? ? ? ?Brief summary  ? ?61 y.o. male with history of seizure, alcohol abuse, pulmonary diverticulosis, hypertension who was admitted in February for status epilepticus in the setting of patient being noncompliant with his medication had to be intubated at the time and also had a stroke. He was found to have generalized tonic-clonic activity by patient's family and was brought to the ER.  Initially admitted to the floor.  However patient decompensated and had to be transferred to the ICU.  Had to be intubated again.  Neurology was consulted.  Patient was stabilized.  Subsequently extubated. He remains on 2 lit of [place] oxygen. Due to his continual encephalopathic state, Cortrak was placed for nutrition. He is more alert but remains confused. SLP continues to recommend NPO.  ?Palliative care consulted and after discussions with family. Transitioned to comfort care.  ? ?Assessment & Plan  ? ? ?Assessment and Plan: ? ?History of seizures admitted with status epilepticus.   ?History of EtOH abuse and medical noncompliance ?Patient was intubated on admission and extubated.  Currently on 2 lit of [place] oxygen.  ?Neurology on board recommended to continue with Vimpat 150 mg BID, keppra 1000 mg BID,. ?MRI brain without contrast shows chronic microvascular disease generalized cerebral volume loss.  ?LTM EEG does not show any seizures, it was discontinued.  ? ? ? ?Acute metabolic encephalopathy thought to be secondary to status epilepticus.  Mentation has been very slow to improve still somnolent at times. He is able to answer simple questions.  ?Slp evaluated and recommended npo, as he couldn't clear secretions.  ?Changed  Qhs seroquel to prn at night. Pt  still very confused.  ? ? ? ?Fever,  suspect persistent aspiration pneumonia ?Competed 5 days of antibiotics.  ?Blood cultures ordered and pending, negative so far.  ?Patient's urine analysis is negative for infection. ?Leukocytosis resolved.  ? ? ?Acute respiratory failure with hypoxia related to status epilepticus and aspiration pneumonia . ?Patient was extubated, though he completed the course of antibiotics  ?patient continued to have low grade fevers, last fever on 04/07/22,.  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 Suspect all chronic changes.  ?Debris plugging the distal bronchus intermedius as well as right middle and right lower lobe bronchi.   ?Completed 5 days of IV antibiotics and weaned him to 1 lit of [place] oxygen.  ? ?History of EtOH and tobacco abuse ?Patient currently is on nicotine patch. ? ? ? ?Oropharyngeal dysphagia currently has cortrack for nutrition.   ?Patient is currently not safe for oral intake, as he is still encephalopathic and unable to clear secretions.  ?SLP on board and continues to follow. SLP eval on 04/08/22 recommending NPO.  ?Further discussions with  palliative care, transitioned him to comfort measures, as pt has been declining in the last one year, continues to be encephalopathic, unable to clear secretions, unable to take any by mouth ..  ? ? ?Hypertension ?Blood pressure parameters are optimal  ? ? ?Mild hyponatremia/hypomagnesemia ?Sodium of 134.  ?Magnesium level wnl.  ? ? ? ?Anemia of chronic disease ?Normocytic ?Continue to monitor. ? ? ?Loose stools probably secondary to tube feedings ?On prn Imodium. ?Stool is more solidified and rectal tube is out.  ? ? ? ?History of DVT/PE ?Completed course of anticoagulation. ? ? ?Nutrition: ?Comfort feeds.  ? ? ? ?Malnutrition Type: ? ?Nutrition Problem: Moderate  Malnutrition ?Etiology: social / environmental circumstances (alcohol abuse) ? ? ?Malnutrition Characteristics: ? ?Signs/Symptoms: mild fat depletion, mild muscle depletion, moderate muscle depletion ? ? ?Nutrition Interventions: ? ?Interventions: Tube feeding, Prostat ? ?Estimated body mass index is 21.55 kg/m? as calculated from the following: ?  Height as of this encounter: 6\' 1"  (1.854 m). ?  Weight as of this encounter: 74.1 kg. ? ?Code Status: Full code ?DVT Prophylaxis:   ? ? ?Level of Care: Level of care: Progressive transition to med surg.  ?Family Communication: None at bedside ? ?Disposition Plan:     Remains inpatient appropriate: inpatient hospice.  ? ?Procedures:  ?LTM EEG ?MRI brain without contrast.  ?CT chest without contrast.  ? ?Consultants:   ?Neurology ? ?Antimicrobials:  ? ?Anti-infectives (From admission, onward)  ? ? Start     Dose/Rate Route Frequency Ordered Stop  ? 04/07/22 1730  Ampicillin-Sulbactam (UNASYN) 3 g in sodium chloride 0.9 % 100 mL IVPB  Status:  Discontinued       ? 3 g ?200 mL/hr over 30 Minutes Intravenous Every 6 hours 04/07/22 1638 04/11/22 0940  ? 03/24/22 1000  amoxicillin (AMOXIL) capsule 500 mg       ? 500 mg Per Tube Every 8 hours 03/24/22 0851 03/27/22 2121  ? 03/22/22 1000  Ampicillin-Sulbactam (UNASYN) 3 g in sodium chloride 0.9 % 100 mL IVPB  Status:  Discontinued       ? 3 g ?200 mL/hr over 30 Minutes Intravenous Every 6 hours 03/22/22 0842 03/24/22 0851  ? 03/20/22 1700  cefTRIAXone (ROCEPHIN) 2 g in sodium chloride 0.9 % 100 mL IVPB  Status:  Discontinued       ? 2 g ?200 mL/hr over 30 Minutes Intravenous Every 24 hours 03/20/22 1611 03/22/22 0842  ? ?  ? ? ? ?Medications ? ?Scheduled Meds: ? LORazepam  1 mg Intravenous Q6H  ?  morphine injection  2 mg Intravenous Q6H  ? ?Continuous Infusions: ? sodium chloride Stopped (04/03/22 1035)  ? ?PRN Meds:.acetaminophen **OR** acetaminophen, antiseptic oral rinse, bisacodyl, glycopyrrolate **OR** glycopyrrolate **OR**  glycopyrrolate, LORazepam, morphine injection, ondansetron **OR** ondansetron (ZOFRAN) IV, polyvinyl alcohol ? ? ? ?Subjective:  ? ?Frederick [name] was seen and examined today. Still confused.  ? ?Objective:  ? ?Vitals:  ? 04/10/22 2305 04/10/22 2349 04/11/22 0258 04/11/22 0749  ?BP: (!) 144/81  (!) 100/56 132/75  ?Pulse: 76 87 84 81  ?Resp: 14 (!) 23 19 17   ?Temp: (!) 97.4 ?F (36.3 ?C)  97.7 ?F (36.5 ?C) 97.7 ?F (36.5 ?C)  ?TempSrc: Axillary  Axillary Oral  ?SpO2: 100% 100% 100% 100%  ?Weight:      ?Height:      ? ? ?Intake/Output Summary (Last 24 hours) at 04/11/2022 1215 ?Last data filed at 04/11/2022 0800 ?Gross per 24 hour  ?Intake 0 ml  ?Output  1000 ml  ?Net -1000 ml  ? ? ?Filed Weights  ? 04/08/22 0232 04/09/22 0453 04/10/22 0452  ?Weight: 74.2 kg 75 kg 74.1 kg  ? ? ? ?Exam ?General exam: Ill appearing gentleman not in distress  ?Respiratory system: diminished air entry at bases.  ?Cardiovascular system: S1 & S2 heard, RRR.  No pedal edema. ?Gastrointestinal system: Abdomen is soft, bowel sounds okay.  ?Central nervous system: Alert , oriented to self.  ?Extremities: no pedal edema.  ?Skin: No rashes seen.  ?Psychiatry: unable to assess.  ? ? ? ? ? ? ?Data Reviewed:  I have personally reviewed following labs and imaging studies ? ? ?CBC ?Lab Results  ?Component Value Date  ? WBC 9.8 04/09/2022  ? RBC 4.49 04/09/2022  ? HGB 12.0 (L) 04/09/2022  ? HCT 37.2 (L) 04/09/2022  ? MCV 82.9 04/09/2022  ? MCH 26.7 04/09/2022  ? PLT 378 04/09/2022  ? MCHC 32.3 04/09/2022  ? RDW 17.3 (H) 04/09/2022  ? LYMPHSABS 1.9 04/09/2022  ? MONOABS 1.1 (H) 04/09/2022  ? EOSABS 0.6 (H) 04/09/2022  ? BASOSABS 0.1 04/09/2022  ? ? ? ?Last metabolic panel ?Lab Results  ?Component Value Date  ? NA 139 04/09/2022  ? K 4.3 04/09/2022  ? CL 104 04/09/2022  ? CO2 27 04/09/2022  ? BUN 34 (H) 04/09/2022  ? CREATININE 0.80 04/09/2022  ? GLUCOSE 124 (H) 04/10/2022  ? GFRNONAA >60 04/09/2022  ? GFRAA >60 04/27/2020  ? CALCIUM 9.5 04/09/2022  ? PHOS 4.5  03/24/2022  ? PROT 7.5 04/02/2022  ? ALBUMIN 2.8 (L) 04/02/2022  ? BILITOT 0.3 04/02/2022  ? ALKPHOS 107 04/02/2022  ? AST 27 04/02/2022  ? ALT 28 04/02/2022  ? ANIONGAP 8 04/09/2022  ? ? ?CBG (last 3)  ?Recent [place]

## 2022-04-11 NOTE — Progress Notes (Signed)
[facility] ([facility]) Hospital Liaison Note  Referral received for patient/family interest in [facility]. Chart under review by [facility] physician.   Hospice eligibility pending.   Please call with any questions or concerns. Thank you   Shanita Wicker, LCSW [facility] Hospital Liaison  [phone] 

## 2022-04-11 NOTE — Progress Notes (Signed)
2349-pt sating at 100% on 2 L [place] this RN weaned pt down to 1L [place] patient is still sating at 100% and in no distress  ?

## 2022-04-11 NOTE — Consult Note (Signed)
?Palliative Medicine Inpatient Consult Note ? ?Consulting Provider: Kathlen ModyAkula, Vijaya, MD ? ?Reason for consult:   ?Palliative Care Consult Services Palliative Medicine Consult  ?Reason for Consult? goals of care , nutrition.  ? ?HPI:  ?Per intake H&P --> 61 y.o. male with history of seizure, alcohol abuse, pulmonary diverticulosis, hypertension who was admitted in February for status epilepticus in the setting of patient being noncompliant with his medication had to be intubated at the time and also had a stroke. He was found to have generalized tonic-clonic activity by patient's family and was brought to the ER.  Initially admitted to the floor.  However patient decompensated and had to be transferred to the ICU.  Had to be intubated again.  Neurology was consulted.  Patient was stabilized.  Subsequently extubated. He remains on 2 lit of  oxygen. Due to his continual encephalopathic state, Cortrak was placed for nutrition. He is more alert but remains confused. ? ?Palliative care was consulted for goals of care conversations in the setting of a prolonged hospitalization, multiple comorbidities, and increased frailty to further address goals of care. ? ?Clinical Assessment/Goals of Care: ? ?*Please note that this is a verbal dictation therefore any spelling or grammatical errors are due to the "Dragon Medical One" system interpretation. ? ?I have reviewed medical records including EPIC notes, labs and imaging, received report from bedside RN, assessed the patient who is pleasantly confused this morning. ?  ?I called patient's longtime girlfriend, Frederick [name] to further discuss diagnosis prognosis, GOC, EOL wishes, disposition and options. ?  ?I introduced Palliative Medicine as specialized medical care for people living with serious illness. It focuses on providing relief from the symptoms and stress of a serious illness. The goal is to improve quality of life for both the patient and the family. ? ?Medical  History Review and Understanding: ? ?A review of patient's past medical history inclusive of his asthma, alcohol dependence, hypertension, seizures was completed. ? ?Reason for acute admission on 4/15 was that of generalized tonic-clonic jerks in the setting of seizure activity. ? ?Social History: ? ?Frederick [name] from [place]  Frederick [name] is not married though has a long-term girlfriend, Frederick [name] with whom he lives.  He has never had children.  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 ? ?Functional and Nutritional State: ? ?Per Frederick Burlyhandra over the past year Abyan's physical function had declined significantly whereby she was more responsible for his basic activities of daily living.  More recently he has lost continence.  Frederick [name] shares that his memory has declined as well and he is exceptionally forgetful within moments of getting information. ? ?Advance Directives: ? ?A detailed discussion was had today regarding advanced directives.  Patient does not have any children-the closest person to him is his long-term girlfriend who is his surrogate Management consultantdecision maker. ? ?Code Status: ? ?Encouraged Frederick [name] to consider DNR/DNI status understanding evidenced based poor outcomes in similar hospitalized patient, as the cause of arrest is likely associated with advanced chronic/terminal illness rather than an easily reversible acute cardio-pulmonary event. I explained that DNR/DNI does not change the medical plan and it only comes into effect after a person has arrested (died).  It is a protective measure to keep Frederick [name] from harming the patient in their last moments of life. Frederick [name] was agreeable to DNR/DNI with understanding that patient would not  receive CPR, defibrillation, ACLS medications, or intubation.  ? ?Discussion: ? ?We discussed that Frederick [name] has been hospitalized  over 22 days and has not improved tremendously since being here.  We reviewed that he is unable to eat or drink sufficiently due to his ongoing confusion and presently he is being artificially supported by a nasogastric tube.  Discussed that Frederick [name] has multiple chronic conditions-in the setting of his declining health state and this prolonged hospital stay we reviewed that his overall outlook for returning to a remotely functional quality of life is poor. ? ?Discussed either continuing down more of an aggressive path or transitioning her focus to comfort oriented care.  I shared that with comfort oriented care we remove the tube from Frederick [name]'s nose and after that I suspect his time on earth would be quite short. Frederick Burly expresses understanding and shares that she thought this day was coming.  We reviewed that she has had a feeling throughout his prolonged hospital stay that he was likely going to do poorly.  At this time she is elected for him to be comfortable and to pass away with dignity and peace. ? ?Reviewed that our goal will be to alleviate any suffering that Frederick [name] is experiencing 3 low doses of medication to aid in symptom relief.  I shared that if Frederick [name] should remain stable it is reasonable to consider transferring him to an inpatient hospice home which Frederick Burly understands.  I did share that I suspect his time will be limited days to weeks on earth, clearly this was difficult to hear though she shares acceptance. ? ?Discussed the importance of continued conversation with family and their  medical providers regarding overall plan of care and treatment options, ensuring decisions are within the context of the patients values and GOCs. ? ?Decision Maker: ?Frederick Churches (significant other): [phone] ? ?SUMMARY OF RECOMMENDATIONS   ?DNAR/DNI ? ?Comfort measures ? ?Will initiate low-dose Ativan and morphine around-the-clock ? ?Unrestricted visitation-have encouraged patient's girlfriend to invite friends  and family to visit with Magda Paganini ? ?Appreciate transitions of care team referring to inpatient hospice ? ?Ongoing palliative support during hospitalization ? ?Code Status/Advance Care Planning: ?DNAR/DNI ? ?Palliative Prophylaxis:  ?Aspiration, Bowel Regimen, Delirium Protocol, Frequent Pain Assessment, Oral Care, Palliative Wound Care, and Turn Reposition ? ?Additional Recommendations (Limitations, Scope, Preferences): ?Comfort focused care ? ?Psycho-social/Spiritual:  ?Desire for further Chaplaincy support: Yes-[employer] ?Additional Recommendations: Education on chronic disease process ?  ?Prognosis: Limited days to weeks ? ?Discharge Planning: Discharge to inpatient hospice once a bed is identified. ? ?Vitals:  ? 04/11/22 0258 04/11/22 0749  ?BP: (!) 100/56 132/75  ?Pulse: 84 81  ?Resp: 19 17  ?Temp: 97.7 ?F (36.5 ?C) 97.7 ?F (36.5 ?C)  ?SpO2: 100% 100%  ? ? ?Intake/Output Summary (Last 24 hours) at 04/11/2022 0945 ?Last data filed at 04/11/2022 0324 ?Gross per 24 hour  ?Intake --  ?Output 1000 ml  ?Net -1000 ml  ? ?Last Weight  Most recent update: 04/10/2022  4:53 AM  ? ? Weight  ?74.1 kg (163 lb 5.8 oz)  ?      ? ?  ? ?Gen: Older African-American male chronically ill in appearance ?HEENT: NG tube, dry mucous membranes ?CV: Regular rate and rhythm ?PULM: On 1 L nasal cannula breathing is even and nonlabored ?ABD: soft/nontender ?EXT: No edema ?Neuro: Alert to self ? ?PPS: 10% ? ? ?This conversation/these recommendations were discussed with patient primary care team, Dr. Blake Divine ? ?Billing based on MDM: High ? ?Problems Addressed: One  acute or chronic illness or injury that poses a threat to life or bodily function ? ?Amount and/or Complexity of Data: Category 3:Discussion of management or test interpretation with external physician/other qualified health care professional/appropriate source (not separately reported) ? ?Risks: Decision not to resuscitate or to de-escalate care because of poor  prognosis ?______________________________________________________ ?Lamarr Lulas ?[facility] Palliative Medicine Team ?Team Cell Phone: [phone] ?Please utilize secure chat with additional questions, if there is no response within

## 2022-04-12 LAB — CULTURE, BLOOD (ROUTINE X 2)
Culture: NO GROWTH
Culture: NO GROWTH
Special Requests: ADEQUATE
Special Requests: ADEQUATE

## 2022-04-12 LAB — GLUCOSE, CAPILLARY: Glucose-Capillary: 36 mg/dL — CL (ref 70–99)

## 2022-04-12 MED ORDER — GLYCOPYRROLATE 1 MG PO TABS
1.0000 mg | ORAL_TABLET | ORAL | Status: AC | PRN
Start: 2022-04-12 — End: ?

## 2022-04-12 NOTE — Progress Notes (Signed)
Nutrition Brief Note  Chart reviewed. Pt now transitioning to comfort care.  No further nutrition interventions planned at this time.  Please re-consult as needed.   Jatoya Armbrister, RD, LDN Clinical Dietitian RD pager # available in AMION  After hours/weekend pager # available in AMION   

## 2022-04-12 NOTE — Progress Notes (Signed)
Report given to Myra at the [facility]. ?

## 2022-04-12 NOTE — Progress Notes (Signed)
This chaplain responded to PMT consult for EOL spiritual care. The Pt. 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 The Pt. pauses to respond to the chaplain's affirmation of the Pt. skills and returns to his story telling.  ? ?With the Pt. permission, the Pt. and chaplain pray together before the chaplain leaves the room. ? ?Chaplain Stephanie Acre ?[phone] ?

## 2022-04-12 NOTE — Progress Notes (Addendum)
Patient Frederick [name]      DOB: 09-21-1961      SNK:[id number] ? ? ? ?  ?Palliative Medicine Team ? ? ? ?Subjective: Bedside symptom check completed. No family or visitors present at time of visit.  ? ? ?Physical exam: Patient alert, trying to climb out of bed at time of visit. Patient confused on exam, looking for "the men" but unable to tell this RN who he is speaking about. Breathing even and non-labored, no excessive secretions noted. Patient without physical or non-verbal signs of pain or discomfort at this time. Patient wearing hand mitts for safety, bed alarm activated.  ? ? ?Assessment and plan: This RN repositioned the patient to be more in the center of the bed, applied pillows for comfort. Patient verbally denies pain or discomfort at this time, stating that he "is relaxed." Bedside RN entered room during visit with morning medications, including ativan for anxiety (see eMAR). Bedside RN without additional needs or concerns today. Patient remains stable for transfer today. Will continue to follow for any changes or advances.  ? ? ?Thank you for allowing the Palliative Medicine Team to assist in the care of this patient. ?  ?  ?Shelda Jakes, MSN, RN ?Palliative Medicine Team ?Team Phone: [phone]  ?This phone is monitored 7a-7p, please reach out to attending physician outside of these hours for urgent needs.   ?

## 2022-04-12 NOTE — Progress Notes (Signed)
[facility] ([facility]) Hospital Liaison Note ? ?Bed offered and accepted for transfer today to [facility]. Unit RN please call report to [phone] prior to patient leaving the unit. Please send signed DNR and paperwork with patient.  ? ?Please call with any questions or concerns. Thank you ? ?Shanita Wicker, LCSW ?[facility] Hospital Liaison ?[phone] ?

## 2022-04-12 NOTE — Discharge Summary (Signed)
?Physician Discharge Summary ?  ?Patient: Frederick [name] MRN: [id number] DOB: 03-15-1961  ?Admit date:     03/19/2022  ?Discharge date: 04/12/22  ?Discharge Physician: Hosie Poisson  ? ?PCP: [facility]  ? ?Recommendations at discharge:  ?Please follow up with hospice MD.  ? ?Discharge Diagnoses: ?Principal Problem: ?  Seizure ([place]) ?Active Problems: ?  Status epilepticus ([place]) ?  EtOH dependence ([place]) ?  Microcytic anemia ?  Acute metabolic encephalopathy ?  Cerebral thrombosis with cerebral infarction ?  Hypoglycemia ?  HTN (hypertension) ? ? ? ?Hospital Course: ? ?61 y.o. male with history of seizure, alcohol abuse, pulmonary diverticulosis, hypertension who was admitted in February for status epilepticus in the setting of patient being noncompliant with his medication had to be intubated at the time and also had a stroke. He was found to have generalized tonic-clonic activity by patient's family and was brought to the ER.  Initially admitted to the floor.  However patient decompensated and had to be transferred to the ICU.  Had to be intubated again.  Neurology was consulted.  Patient was stabilized.  Subsequently extubated. He remains on 2 lit of [place] oxygen. Due to his continual encephalopathic state, Cortrak was placed for nutrition. He is more alert but remains confused. SLP continues to recommend NPO.  ?Palliative care consulted and after discussions with family. Transitioned to comfort care.  ?Assessment and Plan: ? ?History of seizures admitted with status epilepticus.   ?History of EtOH abuse and medical noncompliance ?Patient was intubated on admission and extubated.  Currently on 2 lit of [place] oxygen.  ?Neurology on board recommended to continue with Vimpat 150 mg BID, keppra 1000 mg BID,. ?MRI brain without contrast shows chronic microvascular disease generalized cerebral volume loss.  ?LTM EEG does not show any seizures, it was discontinued.  ?  ?  ?  ?Acute metabolic encephalopathy thought to be  secondary to status epilepticus.  Mentation has been very slow to improve still somnolent at times. He is able to answer simple questions.  ?Slp evaluated and recommended npo, as he couldn't clear secretions.  ?Changed  Qhs seroquel to prn at night. Pt still very confused.  ?  ?  ?  ?Fever,  suspect persistent aspiration pneumonia ?Competed 5 days of antibiotics.  ?Blood cultures ordered and pending, negative so far.  ?Patient's urine analysis is negative for infection. ?Leukocytosis resolved.  ?  ?  ?Acute respiratory failure with hypoxia related to status epilepticus and aspiration pneumonia . ?Patient was extubated, though he completed the course of antibiotics  ?patient continued to have low grade fevers, last fever on 04/07/22,.  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 Suspect all chronic changes.  ?Debris plugging the distal bronchus intermedius as well as right middle and right lower lobe bronchi.   ?Completed 5 days of IV antibiotics and weaned him to 1 lit of [place] oxygen.  ?  ?History of EtOH and tobacco abuse ?Patient currently is on nicotine patch. ?  ?  ?  ?Oropharyngeal dysphagia currently has cortrack for nutrition.   ?Patient is currently not safe for oral intake, as he is still encephalopathic and unable to clear secretions.  ?SLP on board and continues to follow. SLP eval on 04/08/22 recommending NPO.  ?Further discussions with palliative care, transitioned him to comfort measures, as pt has been declining in the last one year, continues to be  encephalopathic, unable to clear secretions, unable to take any by mouth ..  ?  ?  ?Hypertension ?Blood pressure parameters are optimal  ?  ?  ?Mild hyponatremia/hypomagnesemia ?Sodium of 134.  ?Magnesium level wnl.  ?  ?  ?  ?Anemia of chronic disease ?Normocytic ?Continue to monitor. ?  ?  ?Loose  stools probably secondary to tube feedings ?On prn Imodium. ?Stool is more solidified and rectal tube is out.  ?  ?  ?  ?History of DVT/PE ?Completed course of anticoagulation. ?  ?  ?Nutrition: ?Comfort feeds.  ?  ?  ? ?  ? ?  ? ? ?Consultants: neurology\palliative care ?Procedures performed: EEG  ?Disposition: Hospice care ?Diet recommendation:  ?Comfort feeds.  ?DISCHARGE MEDICATION: ?Allergies as of 04/12/2022   ?No Known Allergies ?  ? ?  ?Medication List  ?  ? ?STOP taking these medications   ? ?albuterol 108 (90 Base) MCG/ACT inhaler ?Commonly known as: VENTOLIN HFA ?  ?aspirin 81 MG chewable tablet ?  ?atorvastatin 40 MG tablet ?Commonly known as: LIPITOR ?  ?carvedilol 25 MG tablet ?Commonly known as: COREG ?  ?cetirizine 10 MG tablet ?Commonly known as: ZYRTEC ?  ?cilostazol 100 MG tablet ?Commonly known as: PLETAL ?  ?cyanocobalamin 100 MCG tablet ?  ?diclofenac Sodium 1 % Gel ?Commonly known as: VOLTAREN ?  ?Eliquis 5 MG Tabs tablet ?Generic drug: apixaban ?  ?feeding supplement Liqd ?  ?fluticasone 50 MCG/ACT nasal spray ?Commonly known as: FLONASE ?  ?folic acid 1 MG tablet ?Commonly known as: FOLVITE ?  ?gabapentin 100 MG capsule ?Commonly known as: NEURONTIN ?  ?ipratropium-albuterol 0.5-2.5 (3) MG/3ML Soln ?Commonly known as: DUONEB ?  ?levETIRAcetam 1000 MG tablet ?Commonly known as: KEPPRA ?  ?magnesium oxide 400 (240 Mg) MG tablet ?Commonly known as: MAG-OX ?  ?multivitamin with minerals Tabs tablet ?  ?nicotine 21 mg/24hr patch ?Commonly known as: NICODERM CQ - dosed in mg/24 hours ?  ?QUEtiapine 25 MG tablet ?Commonly known as: SEROQUEL ?  ?sildenafil 100 MG tablet ?Commonly known as: VIAGRA ?  ?thiamine 100 MG tablet ?  ? ?  ? ?TAKE these medications   ? ?glycopyrrolate 1 MG tablet ?Commonly known as: ROBINUL ?Take 1 tablet (1 mg total) by mouth every 4 (four) hours as needed (excessive secretions). ?  ? ?  ? ? ?Discharge Exam: ?Filed Weights  ? 04/08/22 0232 04/09/22 0453 04/10/22 0452   ?Weight: 74.2 kg 75 kg 74.1 kg  ? ?General exam: Ill appearing gentleman not in distress  ?Respiratory system: diminished air entry at bases.  ?Cardiovascular system: S1 & S2 heard, RRR.  No pedal edema. ?Gastrointestinal system: Abdomen is soft, bowel sounds okay.  ?Central nervous system: Alert , confused.  ?Extremities: no pedal edema.  ?Skin: No rashes seen.  ?Psychiatry: unable to assess.  ?  ? ?Condition at discharge: fair ? ?The results of significant diagnostics from this hospitalization (including imaging, microbiology, ancillary and laboratory) are listed below for reference.  ? ?Imaging Studies: ?CT ANGIO HEAD NECK W WO CM ? ?Result Date: 03/20/2022 ?CLINICAL DATA:  Neuro deficit, acute, stroke suspected. Left-sided weakness. Initial contrast bolus is limited by narrowing in the right subclavian vein. Patient was scheduled to repeat the CTA with a left-sided injection. Dr. Lorrin Goodell has evaluated the patient in feels that seizure activity is the most likely diagnosis. Although the contrast bolus is suboptimal, we will not repeat the injection. EXAM: CT ANGIOGRAPHY HEAD AND NECK TECHNIQUE: Multidetector CT imaging of the  head and neck was performed using the standard protocol during bolus administration of intravenous contrast. Multiplanar CT image reconstructions and MIPs were obtained to evaluate the vascular anatomy. Carotid stenosis measurements (when applicable) are obtained utilizing NASCET criteria, using the distal internal carotid diameter as the denominator. RADIATION DOSE REDUCTION: This exam was performed according to the departmental dose-optimization program which includes automated exposure control, adjustment of the mA and/or kV according to patient size and/or use of iterative reconstruction technique. CONTRAST:  55mL OMNIPAQUE IOHEXOL 350 MG/ML SOLN COMPARISON:  CT head without contrast 03/19/2022. CTA head and neck 01/18/2022. FINDINGS: CTA NECK FINDINGS Aortic arch: Common origin of  the left common carotid artery and innominate artery again seen. The left vertebral artery is not well seen at the arch. Right carotid system: Proximal right common carotid artery is poorly seen. Atherosclerotic calcifi

## 2022-04-12 NOTE — TOC CM/SW Note (Signed)
PTAR called for transport to [facility]. DNR and PTAR paperwork on chart  ?
